# Patient Record
Sex: Female | Born: 1935 | Race: White | Hispanic: No | Smoking: Former smoker
Health system: Southern US, Community
[De-identification: ages and names within clinical notes are randomized; demographics above are authoritative.]

## PROBLEM LIST (undated history)

## (undated) DIAGNOSIS — G8929 Other chronic pain: Secondary | ICD-10-CM

## (undated) DIAGNOSIS — M353 Polymyalgia rheumatica: Secondary | ICD-10-CM

## (undated) DIAGNOSIS — Z8711 Personal history of peptic ulcer disease: Secondary | ICD-10-CM

## (undated) DIAGNOSIS — F419 Anxiety disorder, unspecified: Secondary | ICD-10-CM

## (undated) DIAGNOSIS — N760 Acute vaginitis: Secondary | ICD-10-CM

## (undated) DIAGNOSIS — H409 Unspecified glaucoma: Secondary | ICD-10-CM

## (undated) DIAGNOSIS — F329 Major depressive disorder, single episode, unspecified: Secondary | ICD-10-CM

## (undated) DIAGNOSIS — M545 Low back pain, unspecified: Secondary | ICD-10-CM

## (undated) DIAGNOSIS — S32000A Wedge compression fracture of unspecified lumbar vertebra, initial encounter for closed fracture: Secondary | ICD-10-CM

## (undated) DIAGNOSIS — J309 Allergic rhinitis, unspecified: Secondary | ICD-10-CM

## (undated) DIAGNOSIS — M1990 Unspecified osteoarthritis, unspecified site: Secondary | ICD-10-CM

## (undated) DIAGNOSIS — D126 Benign neoplasm of colon, unspecified: Secondary | ICD-10-CM

## (undated) DIAGNOSIS — F32A Depression, unspecified: Secondary | ICD-10-CM

## (undated) DIAGNOSIS — J189 Pneumonia, unspecified organism: Secondary | ICD-10-CM

## (undated) DIAGNOSIS — M810 Age-related osteoporosis without current pathological fracture: Secondary | ICD-10-CM

## (undated) DIAGNOSIS — Z8719 Personal history of other diseases of the digestive system: Secondary | ICD-10-CM

## (undated) DIAGNOSIS — I8390 Asymptomatic varicose veins of unspecified lower extremity: Secondary | ICD-10-CM

## (undated) DIAGNOSIS — K2270 Barrett's esophagus without dysplasia: Secondary | ICD-10-CM

## (undated) DIAGNOSIS — M9420 Chondromalacia, unspecified site: Secondary | ICD-10-CM

## (undated) DIAGNOSIS — K219 Gastro-esophageal reflux disease without esophagitis: Secondary | ICD-10-CM

## (undated) DIAGNOSIS — K5790 Diverticulosis of intestine, part unspecified, without perforation or abscess without bleeding: Secondary | ICD-10-CM

## (undated) DIAGNOSIS — I639 Cerebral infarction, unspecified: Secondary | ICD-10-CM

## (undated) HISTORY — DX: Chondromalacia, unspecified site: M94.20

## (undated) HISTORY — PX: FRACTURE SURGERY: SHX138

## (undated) HISTORY — DX: Gastro-esophageal reflux disease without esophagitis: K21.9

## (undated) HISTORY — DX: Major depressive disorder, single episode, unspecified: F32.9

## (undated) HISTORY — DX: Asymptomatic varicose veins of unspecified lower extremity: I83.90

## (undated) HISTORY — DX: Pneumonia, unspecified organism: J18.9

## (undated) HISTORY — DX: Unspecified glaucoma: H40.9

## (undated) HISTORY — PX: CATARACT EXTRACTION W/ INTRAOCULAR LENS  IMPLANT, BILATERAL: SHX1307

## (undated) HISTORY — DX: Acute vaginitis: N76.0

## (undated) HISTORY — DX: Depression, unspecified: F32.A

## (undated) HISTORY — DX: Anxiety disorder, unspecified: F41.9

## (undated) HISTORY — DX: Polymyalgia rheumatica: M35.3

## (undated) HISTORY — DX: Age-related osteoporosis without current pathological fracture: M81.0

## (undated) HISTORY — DX: Allergic rhinitis, unspecified: J30.9

## (undated) HISTORY — PX: LUMBAR SPINE SURGERY: SHX701

## (undated) HISTORY — DX: Diverticulosis of intestine, part unspecified, without perforation or abscess without bleeding: K57.90

## (undated) HISTORY — DX: Barrett's esophagus without dysplasia: K22.70

## (undated) HISTORY — PX: VARICOSE VEIN SURGERY: SHX832

## (undated) HISTORY — DX: Benign neoplasm of colon, unspecified: D12.6

## (undated) HISTORY — PX: LUNG BIOPSY: SHX232

## (undated) HISTORY — PX: FIXATION KYPHOPLASTY LUMBAR SPINE: SHX1642

## (undated) HISTORY — PX: BACK SURGERY: SHX140

---

## 1997-06-10 DIAGNOSIS — K2270 Barrett's esophagus without dysplasia: Secondary | ICD-10-CM

## 1997-06-10 HISTORY — DX: Barrett's esophagus without dysplasia: K22.70

## 1997-11-22 ENCOUNTER — Other Ambulatory Visit: Admission: RE | Admit: 1997-11-22 | Discharge: 1997-11-22 | Payer: Self-pay | Admitting: Obstetrics and Gynecology

## 1998-12-10 ENCOUNTER — Other Ambulatory Visit: Admission: RE | Admit: 1998-12-10 | Discharge: 1998-12-10 | Payer: Self-pay | Admitting: Obstetrics and Gynecology

## 1999-06-11 DIAGNOSIS — D126 Benign neoplasm of colon, unspecified: Secondary | ICD-10-CM

## 1999-06-11 HISTORY — DX: Benign neoplasm of colon, unspecified: D12.6

## 1999-06-16 ENCOUNTER — Ambulatory Visit (HOSPITAL_COMMUNITY): Admission: RE | Admit: 1999-06-16 | Discharge: 1999-06-16 | Payer: Self-pay | Admitting: *Deleted

## 1999-06-16 ENCOUNTER — Encounter (INDEPENDENT_AMBULATORY_CARE_PROVIDER_SITE_OTHER): Payer: Self-pay

## 2000-05-23 ENCOUNTER — Other Ambulatory Visit: Admission: RE | Admit: 2000-05-23 | Discharge: 2000-05-23 | Payer: Self-pay | Admitting: Obstetrics and Gynecology

## 2001-06-29 ENCOUNTER — Other Ambulatory Visit: Admission: RE | Admit: 2001-06-29 | Discharge: 2001-06-29 | Payer: Self-pay | Admitting: Obstetrics and Gynecology

## 2001-09-29 ENCOUNTER — Ambulatory Visit (HOSPITAL_COMMUNITY): Admission: RE | Admit: 2001-09-29 | Discharge: 2001-09-29 | Payer: Self-pay | Admitting: *Deleted

## 2001-09-29 ENCOUNTER — Encounter (INDEPENDENT_AMBULATORY_CARE_PROVIDER_SITE_OTHER): Payer: Self-pay

## 2002-03-20 ENCOUNTER — Encounter: Admission: RE | Admit: 2002-03-20 | Discharge: 2002-03-20 | Payer: Self-pay | Admitting: Internal Medicine

## 2002-03-20 ENCOUNTER — Encounter: Payer: Self-pay | Admitting: Internal Medicine

## 2002-05-08 ENCOUNTER — Encounter: Admission: RE | Admit: 2002-05-08 | Discharge: 2002-05-08 | Payer: Self-pay | Admitting: Otolaryngology

## 2002-05-08 ENCOUNTER — Encounter: Payer: Self-pay | Admitting: Otolaryngology

## 2002-06-06 ENCOUNTER — Emergency Department (HOSPITAL_COMMUNITY): Admission: EM | Admit: 2002-06-06 | Discharge: 2002-06-06 | Payer: Self-pay | Admitting: Emergency Medicine

## 2002-06-06 ENCOUNTER — Encounter: Payer: Self-pay | Admitting: Emergency Medicine

## 2002-06-13 ENCOUNTER — Emergency Department (HOSPITAL_COMMUNITY): Admission: EM | Admit: 2002-06-13 | Discharge: 2002-06-13 | Payer: Self-pay | Admitting: Emergency Medicine

## 2002-07-30 ENCOUNTER — Other Ambulatory Visit: Admission: RE | Admit: 2002-07-30 | Discharge: 2002-07-30 | Payer: Self-pay | Admitting: Obstetrics and Gynecology

## 2002-10-11 ENCOUNTER — Emergency Department (HOSPITAL_COMMUNITY): Admission: EM | Admit: 2002-10-11 | Discharge: 2002-10-11 | Payer: Self-pay | Admitting: Emergency Medicine

## 2002-10-13 ENCOUNTER — Emergency Department (HOSPITAL_COMMUNITY): Admission: EM | Admit: 2002-10-13 | Discharge: 2002-10-13 | Payer: Self-pay | Admitting: Emergency Medicine

## 2005-11-17 ENCOUNTER — Encounter: Admission: RE | Admit: 2005-11-17 | Discharge: 2005-11-17 | Payer: Self-pay | Admitting: Internal Medicine

## 2006-08-07 ENCOUNTER — Emergency Department (HOSPITAL_COMMUNITY): Admission: EM | Admit: 2006-08-07 | Discharge: 2006-08-07 | Payer: Self-pay | Admitting: Emergency Medicine

## 2007-08-07 ENCOUNTER — Other Ambulatory Visit: Admission: RE | Admit: 2007-08-07 | Discharge: 2007-08-07 | Payer: Self-pay | Admitting: Gynecology

## 2007-08-30 ENCOUNTER — Encounter: Payer: Self-pay | Admitting: Internal Medicine

## 2007-09-01 ENCOUNTER — Encounter: Admission: RE | Admit: 2007-09-01 | Discharge: 2007-09-01 | Payer: Self-pay

## 2007-09-28 ENCOUNTER — Ambulatory Visit: Payer: Self-pay | Admitting: Internal Medicine

## 2007-09-28 DIAGNOSIS — J309 Allergic rhinitis, unspecified: Secondary | ICD-10-CM | POA: Insufficient documentation

## 2007-09-28 DIAGNOSIS — Z87891 Personal history of nicotine dependence: Secondary | ICD-10-CM | POA: Insufficient documentation

## 2007-09-28 DIAGNOSIS — J159 Unspecified bacterial pneumonia: Secondary | ICD-10-CM | POA: Insufficient documentation

## 2007-09-28 DIAGNOSIS — J984 Other disorders of lung: Secondary | ICD-10-CM | POA: Insufficient documentation

## 2007-11-01 ENCOUNTER — Ambulatory Visit: Payer: Self-pay | Admitting: Internal Medicine

## 2008-06-12 ENCOUNTER — Ambulatory Visit (HOSPITAL_COMMUNITY): Admission: RE | Admit: 2008-06-12 | Discharge: 2008-06-12 | Payer: Self-pay | Admitting: *Deleted

## 2009-05-24 ENCOUNTER — Inpatient Hospital Stay (HOSPITAL_COMMUNITY): Admission: EM | Admit: 2009-05-24 | Discharge: 2009-05-31 | Payer: Self-pay | Admitting: Emergency Medicine

## 2009-05-26 ENCOUNTER — Ambulatory Visit: Payer: Self-pay | Admitting: Internal Medicine

## 2009-06-01 ENCOUNTER — Ambulatory Visit: Payer: Self-pay | Admitting: Cardiovascular Disease

## 2009-06-01 ENCOUNTER — Inpatient Hospital Stay (HOSPITAL_COMMUNITY): Admission: EM | Admit: 2009-06-01 | Discharge: 2009-06-11 | Payer: Self-pay | Admitting: Emergency Medicine

## 2009-11-05 ENCOUNTER — Ambulatory Visit: Payer: Self-pay | Admitting: Gynecology

## 2009-11-05 ENCOUNTER — Other Ambulatory Visit: Admission: RE | Admit: 2009-11-05 | Discharge: 2009-11-05 | Payer: Self-pay | Admitting: Gynecology

## 2009-12-03 ENCOUNTER — Ambulatory Visit: Payer: Self-pay | Admitting: Gynecology

## 2010-07-22 ENCOUNTER — Ambulatory Visit (INDEPENDENT_AMBULATORY_CARE_PROVIDER_SITE_OTHER): Payer: Medicare Other | Admitting: Gynecology

## 2010-07-22 DIAGNOSIS — B373 Candidiasis of vulva and vagina: Secondary | ICD-10-CM

## 2010-07-22 DIAGNOSIS — N898 Other specified noninflammatory disorders of vagina: Secondary | ICD-10-CM

## 2010-07-26 LAB — BLOOD GAS, ARTERIAL
Acid-Base Excess: 1.9 mmol/L (ref 0.0–2.0)
Acid-base deficit: 1.1 mmol/L (ref 0.0–2.0)
Acid-base deficit: 3.9 mmol/L — ABNORMAL HIGH (ref 0.0–2.0)
Acid-base deficit: 4.5 mmol/L — ABNORMAL HIGH (ref 0.0–2.0)
Acid-base deficit: 5 mmol/L — ABNORMAL HIGH (ref 0.0–2.0)
Acid-base deficit: 9.8 mmol/L — ABNORMAL HIGH (ref 0.0–2.0)
Bicarbonate: 19 mEq/L — ABNORMAL LOW (ref 20.0–24.0)
Bicarbonate: 19.9 mEq/L — ABNORMAL LOW (ref 20.0–24.0)
Bicarbonate: 23.3 mEq/L (ref 20.0–24.0)
Bicarbonate: 24.1 mEq/L — ABNORMAL HIGH (ref 20.0–24.0)
Drawn by: 129801
Drawn by: 232811
Drawn by: 232811
Drawn by: 295031
Drawn by: 308601
Drawn by: 324021
Expiratory PAP: 6
FIO2: 0.21 %
FIO2: 0.4 %
FIO2: 1 %
MECHVT: 300 mL
MECHVT: 300 mL
O2 Content: 3 L/min
O2 Content: 3 L/min
O2 Content: 4 L/min
O2 Saturation: 89.8 %
O2 Saturation: 91.4 %
O2 Saturation: 92.4 %
O2 Saturation: 93.7 %
O2 Saturation: 98 %
PEEP: 5 cmH2O
PEEP: 5 cmH2O
Patient temperature: 101.9
Patient temperature: 98.6
Patient temperature: 98.6
Patient temperature: 98.6
Patient temperature: 98.6
Patient temperature: 98.6
Patient temperature: 98.9
Patient temperature: 98.9
RATE: 16 resp/min
TCO2: 17.9 mmol/L (ref 0–100)
TCO2: 18.6 mmol/L (ref 0–100)
TCO2: 18.7 mmol/L (ref 0–100)
TCO2: 20.8 mmol/L (ref 0–100)
TCO2: 22.3 mmol/L (ref 0–100)
TCO2: 22.9 mmol/L (ref 0–100)
pCO2 arterial: 35.3 mmHg (ref 35.0–45.0)
pCO2 arterial: 37.1 mmHg (ref 35.0–45.0)
pCO2 arterial: 38.4 mmHg (ref 35.0–45.0)
pCO2 arterial: 38.4 mmHg (ref 35.0–45.0)
pCO2 arterial: 42 mmHg (ref 35.0–45.0)
pH, Arterial: 7.289 — ABNORMAL LOW (ref 7.350–7.400)
pH, Arterial: 7.333 — ABNORMAL LOW (ref 7.350–7.400)
pH, Arterial: 7.352 (ref 7.350–7.400)
pH, Arterial: 7.359 (ref 7.350–7.400)
pH, Arterial: 7.383 (ref 7.350–7.400)
pH, Arterial: 7.404 — ABNORMAL HIGH (ref 7.350–7.400)
pO2, Arterial: 195 mmHg — ABNORMAL HIGH (ref 80.0–100.0)
pO2, Arterial: 301 mmHg — ABNORMAL HIGH (ref 80.0–100.0)
pO2, Arterial: 69.3 mmHg — ABNORMAL LOW (ref 80.0–100.0)
pO2, Arterial: 72.6 mmHg — ABNORMAL LOW (ref 80.0–100.0)
pO2, Arterial: 88.1 mmHg (ref 80.0–100.0)
pO2, Arterial: 91.5 mmHg (ref 80.0–100.0)
pO2, Arterial: 96.5 mmHg (ref 80.0–100.0)

## 2010-07-26 LAB — GLUCOSE, CAPILLARY
Glucose-Capillary: 100 mg/dL — ABNORMAL HIGH (ref 70–99)
Glucose-Capillary: 102 mg/dL — ABNORMAL HIGH (ref 70–99)
Glucose-Capillary: 106 mg/dL — ABNORMAL HIGH (ref 70–99)
Glucose-Capillary: 106 mg/dL — ABNORMAL HIGH (ref 70–99)
Glucose-Capillary: 111 mg/dL — ABNORMAL HIGH (ref 70–99)
Glucose-Capillary: 116 mg/dL — ABNORMAL HIGH (ref 70–99)
Glucose-Capillary: 117 mg/dL — ABNORMAL HIGH (ref 70–99)
Glucose-Capillary: 140 mg/dL — ABNORMAL HIGH (ref 70–99)
Glucose-Capillary: 153 mg/dL — ABNORMAL HIGH (ref 70–99)
Glucose-Capillary: 83 mg/dL (ref 70–99)
Glucose-Capillary: 88 mg/dL (ref 70–99)
Glucose-Capillary: 94 mg/dL (ref 70–99)
Glucose-Capillary: 95 mg/dL (ref 70–99)
Glucose-Capillary: 99 mg/dL (ref 70–99)

## 2010-07-26 LAB — URINE MICROSCOPIC-ADD ON

## 2010-07-26 LAB — CSF CELL COUNT WITH DIFFERENTIAL: WBC, CSF: 2 /mm3 (ref 0–5)

## 2010-07-26 LAB — DIFFERENTIAL
Basophils Absolute: 0 10*3/uL (ref 0.0–0.1)
Basophils Absolute: 0 10*3/uL (ref 0.0–0.1)
Basophils Relative: 0 % (ref 0–1)
Basophils Relative: 0 % (ref 0–1)
Eosinophils Absolute: 0.2 10*3/uL (ref 0.0–0.7)
Eosinophils Absolute: 0 10*3/uL (ref 0.0–0.7)
Eosinophils Absolute: 0.1 10*3/uL (ref 0.0–0.7)
Eosinophils Relative: 2 % (ref 0–5)
Eosinophils Relative: 5 % (ref 0–5)
Eosinophils Relative: 8 % — ABNORMAL HIGH (ref 0–5)
Lymphocytes Relative: 2 % — ABNORMAL LOW (ref 12–46)
Lymphocytes Relative: 4 % — ABNORMAL LOW (ref 12–46)
Lymphs Abs: 0.4 10*3/uL — ABNORMAL LOW (ref 0.7–4.0)
Lymphs Abs: 0.4 10*3/uL — ABNORMAL LOW (ref 0.7–4.0)
Monocytes Absolute: 0.4 10*3/uL (ref 0.1–1.0)
Monocytes Absolute: 0.4 10*3/uL (ref 0.1–1.0)
Monocytes Absolute: 0.4 10*3/uL (ref 0.1–1.0)
Monocytes Absolute: 0.6 10*3/uL (ref 0.1–1.0)
Monocytes Relative: 4 % (ref 3–12)
Monocytes Relative: 1 % — ABNORMAL LOW (ref 3–12)
Monocytes Relative: 3 % (ref 3–12)
Monocytes Relative: 5 % (ref 3–12)
Neutro Abs: 7.4 10*3/uL (ref 1.7–7.7)
Neutro Abs: 25.3 10*3/uL — ABNORMAL HIGH (ref 1.7–7.7)
Neutrophils Relative %: 86 % — ABNORMAL HIGH (ref 43–77)
Neutrophils Relative %: 93 % — ABNORMAL HIGH (ref 43–77)
Neutrophils Relative %: 97 % — ABNORMAL HIGH (ref 43–77)
WBC Morphology: INCREASED

## 2010-07-26 LAB — CBC
HCT: 39 % (ref 36.0–46.0)
HCT: 40.6 % (ref 36.0–46.0)
HCT: 28.1 % — ABNORMAL LOW (ref 36.0–46.0)
HCT: 28.6 % — ABNORMAL LOW (ref 36.0–46.0)
HCT: 29.1 % — ABNORMAL LOW (ref 36.0–46.0)
HCT: 31.6 % — ABNORMAL LOW (ref 36.0–46.0)
HCT: 31.9 % — ABNORMAL LOW (ref 36.0–46.0)
HCT: 39.3 % (ref 36.0–46.0)
Hemoglobin: 13.4 g/dL (ref 12.0–15.0)
Hemoglobin: 13.5 g/dL (ref 12.0–15.0)
Hemoglobin: 10.8 g/dL — ABNORMAL LOW (ref 12.0–15.0)
Hemoglobin: 13.1 g/dL (ref 12.0–15.0)
Hemoglobin: 9.4 g/dL — ABNORMAL LOW (ref 12.0–15.0)
Hemoglobin: 9.6 g/dL — ABNORMAL LOW (ref 12.0–15.0)
MCHC: 33.3 g/dL (ref 30.0–36.0)
MCHC: 34.3 g/dL (ref 30.0–36.0)
MCHC: 32.5 g/dL (ref 30.0–36.0)
MCHC: 32.7 g/dL (ref 30.0–36.0)
MCHC: 32.9 g/dL (ref 30.0–36.0)
MCHC: 33.3 g/dL (ref 30.0–36.0)
MCHC: 33.6 g/dL (ref 30.0–36.0)
MCHC: 34.2 g/dL (ref 30.0–36.0)
MCV: 90.9 fL (ref 78.0–100.0)
MCV: 92 fL (ref 78.0–100.0)
MCV: 91.2 fL (ref 78.0–100.0)
MCV: 91.3 fL (ref 78.0–100.0)
MCV: 91.3 fL (ref 78.0–100.0)
MCV: 91.8 fL (ref 78.0–100.0)
MCV: 92.1 fL (ref 78.0–100.0)
MCV: 92.1 fL (ref 78.0–100.0)
MCV: 92.4 fL (ref 78.0–100.0)
MCV: 92.5 fL (ref 78.0–100.0)
Platelets: 242 10*3/uL (ref 150–400)
Platelets: 100 10*3/uL — ABNORMAL LOW (ref 150–400)
Platelets: 116 10*3/uL — ABNORMAL LOW (ref 150–400)
Platelets: 128 10*3/uL — ABNORMAL LOW (ref 150–400)
Platelets: 401 10*3/uL — ABNORMAL HIGH (ref 150–400)
Platelets: 454 10*3/uL — ABNORMAL HIGH (ref 150–400)
Platelets: 475 10*3/uL — ABNORMAL HIGH (ref 150–400)
Platelets: 503 10*3/uL — ABNORMAL HIGH (ref 150–400)
Platelets: 528 10*3/uL — ABNORMAL HIGH (ref 150–400)
Platelets: 579 10*3/uL — ABNORMAL HIGH (ref 150–400)
RBC: 4.28 MIL/uL (ref 3.87–5.11)
RBC: 4.41 MIL/uL (ref 3.87–5.11)
RBC: 3.07 MIL/uL — ABNORMAL LOW (ref 3.87–5.11)
RBC: 3.13 MIL/uL — ABNORMAL LOW (ref 3.87–5.11)
RBC: 3.45 MIL/uL — ABNORMAL LOW (ref 3.87–5.11)
RBC: 3.47 MIL/uL — ABNORMAL LOW (ref 3.87–5.11)
RBC: 3.77 MIL/uL — ABNORMAL LOW (ref 3.87–5.11)
RDW: 12.9 % (ref 11.5–15.5)
RDW: 13 % (ref 11.5–15.5)
RDW: 13.6 % (ref 11.5–15.5)
RDW: 13.6 % (ref 11.5–15.5)
RDW: 13.7 % (ref 11.5–15.5)
RDW: 14 % (ref 11.5–15.5)
WBC: 7.7 10*3/uL (ref 4.0–10.5)
WBC: 12 10*3/uL — ABNORMAL HIGH (ref 4.0–10.5)
WBC: 12.9 10*3/uL — ABNORMAL HIGH (ref 4.0–10.5)
WBC: 14.9 10*3/uL — ABNORMAL HIGH (ref 4.0–10.5)
WBC: 18.5 10*3/uL — ABNORMAL HIGH (ref 4.0–10.5)
WBC: 26.1 10*3/uL — ABNORMAL HIGH (ref 4.0–10.5)
WBC: 9.3 10*3/uL (ref 4.0–10.5)
WBC: 9.6 10*3/uL (ref 4.0–10.5)

## 2010-07-26 LAB — BASIC METABOLIC PANEL
BUN: 10 mg/dL (ref 6–23)
BUN: 5 mg/dL — ABNORMAL LOW (ref 6–23)
BUN: 7 mg/dL (ref 6–23)
BUN: 7 mg/dL (ref 6–23)
BUN: 8 mg/dL (ref 6–23)
CO2: 21 mEq/L (ref 19–32)
CO2: 29 mEq/L (ref 19–32)
Calcium: 8 mg/dL — ABNORMAL LOW (ref 8.4–10.5)
Calcium: 8.2 mg/dL — ABNORMAL LOW (ref 8.4–10.5)
Calcium: 8.4 mg/dL (ref 8.4–10.5)
Calcium: 8.7 mg/dL (ref 8.4–10.5)
Calcium: 8.8 mg/dL (ref 8.4–10.5)
Chloride: 102 mEq/L (ref 96–112)
Creatinine, Ser: 0.65 mg/dL (ref 0.4–1.2)
Creatinine, Ser: 0.7 mg/dL (ref 0.4–1.2)
Creatinine, Ser: 0.75 mg/dL (ref 0.4–1.2)
Creatinine, Ser: 0.85 mg/dL (ref 0.4–1.2)
GFR calc Af Amer: >60 mL/min (ref >60)
GFR calc Af Amer: >60 mL/min (ref >60)
GFR calc Af Amer: >60 mL/min (ref >60)
GFR calc Af Amer: >60 mL/min (ref >60)
GFR calc non Af Amer: >60 mL/min (ref >60)
GFR calc non Af Amer: >60 mL/min (ref >60)
GFR calc non Af Amer: >60 mL/min (ref >60)
GFR calc non Af Amer: >60 mL/min (ref >60)
GFR calc non Af Amer: >60 mL/min (ref >60)
GFR calc non Af Amer: >60 mL/min (ref >60)
Glucose, Bld: 132 mg/dL — ABNORMAL HIGH (ref 70–99)
Potassium: 3.6 mEq/L (ref 3.5–5.1)
Potassium: 4.1 mEq/L (ref 3.5–5.1)
Sodium: 133 mEq/L — ABNORMAL LOW (ref 135–145)
Sodium: 134 mEq/L — ABNORMAL LOW (ref 135–145)
Sodium: 135 mEq/L (ref 135–145)
Sodium: 139 mEq/L (ref 135–145)

## 2010-07-26 LAB — BRAIN NATRIURETIC PEPTIDE
Pro B Natriuretic peptide (BNP): 2290 pg/mL — ABNORMAL HIGH (ref 0.0–100.0)
Pro B Natriuretic peptide (BNP): 459 pg/mL — ABNORMAL HIGH (ref 0.0–100.0)
Pro B Natriuretic peptide (BNP): 477 pg/mL — ABNORMAL HIGH (ref 0.0–100.0)
Pro B Natriuretic peptide (BNP): 544 pg/mL — ABNORMAL HIGH (ref 0.0–100.0)

## 2010-07-26 LAB — COMPREHENSIVE METABOLIC PANEL
ALT: 17 U/L (ref 0–35)
AST: 14 U/L (ref 0–37)
AST: 20 U/L (ref 0–37)
AST: 30 U/L (ref 0–37)
Albumin: 1.9 g/dL — ABNORMAL LOW (ref 3.5–5.2)
Albumin: 2.1 g/dL — ABNORMAL LOW (ref 3.5–5.2)
Albumin: 2.1 g/dL — ABNORMAL LOW (ref 3.5–5.2)
Albumin: 2.1 g/dL — ABNORMAL LOW (ref 3.5–5.2)
Alkaline Phosphatase: 52 U/L (ref 39–117)
Alkaline Phosphatase: 54 U/L (ref 39–117)
Alkaline Phosphatase: 55 U/L (ref 39–117)
Alkaline Phosphatase: 56 U/L (ref 39–117)
BUN: 10 mg/dL (ref 6–23)
BUN: 12 mg/dL (ref 6–23)
BUN: 4 mg/dL — ABNORMAL LOW (ref 6–23)
BUN: 5 mg/dL — ABNORMAL LOW (ref 6–23)
BUN: 6 mg/dL (ref 6–23)
Calcium: 7.9 mg/dL — ABNORMAL LOW (ref 8.4–10.5)
Calcium: 8.3 mg/dL — ABNORMAL LOW (ref 8.4–10.5)
Calcium: 8.4 mg/dL (ref 8.4–10.5)
Calcium: 8.8 mg/dL (ref 8.4–10.5)
Chloride: 100 mEq/L (ref 96–112)
Chloride: 100 mEq/L (ref 96–112)
Chloride: 102 mEq/L (ref 96–112)
Creatinine, Ser: 0.58 mg/dL (ref 0.4–1.2)
Creatinine, Ser: 0.7 mg/dL (ref 0.4–1.2)
Creatinine, Ser: 0.73 mg/dL (ref 0.4–1.2)
Creatinine, Ser: 0.77 mg/dL (ref 0.4–1.2)
Creatinine, Ser: 0.78 mg/dL (ref 0.4–1.2)
GFR calc Af Amer: >60 mL/min (ref >60)
GFR calc Af Amer: >60 mL/min (ref >60)
GFR calc Af Amer: >60 mL/min (ref >60)
Glucose, Bld: 101 mg/dL — ABNORMAL HIGH (ref 70–99)
Glucose, Bld: 88 mg/dL (ref 70–99)
Glucose, Bld: 93 mg/dL (ref 70–99)
Potassium: 3.2 mEq/L — ABNORMAL LOW (ref 3.5–5.1)
Potassium: 3.5 mEq/L (ref 3.5–5.1)
Potassium: 3.7 mEq/L (ref 3.5–5.1)
Sodium: 135 mEq/L (ref 135–145)
Sodium: 135 mEq/L (ref 135–145)
Total Bilirubin: 0.4 mg/dL (ref 0.3–1.2)
Total Bilirubin: 0.5 mg/dL (ref 0.3–1.2)
Total Bilirubin: 0.8 mg/dL (ref 0.3–1.2)
Total Bilirubin: 0.8 mg/dL (ref 0.3–1.2)
Total Protein: 4.7 g/dL — ABNORMAL LOW (ref 6.0–8.3)
Total Protein: 5.3 g/dL — ABNORMAL LOW (ref 6.0–8.3)
Total Protein: 5.6 g/dL — ABNORMAL LOW (ref 6.0–8.3)

## 2010-07-26 LAB — BASIC METABOLIC PANEL WITH GFR
BUN: 16 mg/dL (ref 6–23)
CO2: 28 meq/L (ref 19–32)
Calcium: 9.2 mg/dL (ref 8.4–10.5)
Chloride: 102 meq/L (ref 96–112)
Creatinine, Ser: 0.82 mg/dL (ref 0.4–1.2)
GFR calc non Af Amer: >60 mL/min
Glucose, Bld: 86 mg/dL (ref 70–99)
Potassium: 3.5 meq/L (ref 3.5–5.1)
Sodium: 137 meq/L (ref 135–145)

## 2010-07-26 LAB — CULTURE, BLOOD (ROUTINE X 2)
Culture: NO GROWTH
Culture: NO GROWTH
Culture: NO GROWTH

## 2010-07-26 LAB — URINALYSIS, ROUTINE W REFLEX MICROSCOPIC
Bilirubin Urine: NEGATIVE
Glucose, UA: NEGATIVE mg/dL
Hgb urine dipstick: NEGATIVE
Nitrite: NEGATIVE
Protein, ur: NEGATIVE mg/dL
Specific Gravity, Urine: 1.017 (ref 1.005–1.030)
Specific Gravity, Urine: 1.005 (ref 1.005–1.030)
Urobilinogen, UA: 0.2 mg/dL (ref 0.0–1.0)
pH: 7.5 (ref 5.0–8.0)

## 2010-07-26 LAB — LACTIC ACID, PLASMA
Lactic Acid, Venous: 0.8 mmol/L (ref 0.5–2.2)
Lactic Acid, Venous: 0.9 mmol/L (ref 0.5–2.2)
Lactic Acid, Venous: 1.1 mmol/L (ref 0.5–2.2)
Lactic Acid, Venous: 1.9 mmol/L (ref 0.5–2.2)

## 2010-07-26 LAB — URINE CULTURE

## 2010-07-26 LAB — CK TOTAL AND CKMB (NOT AT ARMC)
CK, MB: 1.2 ng/mL (ref 0.3–4.0)
Total CK: 42 U/L (ref 7–177)

## 2010-07-26 LAB — CULTURE, BAL-QUANTITATIVE W GRAM STAIN

## 2010-07-26 LAB — SEDIMENTATION RATE: Sed Rate: 43 mm/hr — ABNORMAL HIGH (ref 0–22)

## 2010-07-26 LAB — PROTEIN AND GLUCOSE, CSF
Glucose, CSF: 77 mg/dL — ABNORMAL HIGH (ref 43–76)
Total  Protein, CSF: 46 mg/dL — ABNORMAL HIGH (ref 15–45)

## 2010-07-26 LAB — CARDIAC PANEL(CRET KIN+CKTOT+MB+TROPI)
CK, MB: 5.3 ng/mL — ABNORMAL HIGH (ref 0.3–4.0)
CK, MB: 6.7 ng/mL (ref 0.3–4.0)
Relative Index: 1.5 (ref 0.0–2.5)
Relative Index: INVALID (ref 0.0–2.5)
Total CK: 163 U/L (ref 7–177)
Total CK: 438 U/L — ABNORMAL HIGH (ref 7–177)
Troponin I: 0.08 ng/mL — ABNORMAL HIGH (ref 0.00–0.06)
Troponin I: 0.15 ng/mL — ABNORMAL HIGH (ref 0.00–0.06)
Troponin I: 0.33 ng/mL — ABNORMAL HIGH (ref 0.00–0.06)
Troponin I: 0.36 ng/mL — ABNORMAL HIGH (ref 0.00–0.06)
Troponin I: 0.58 ng/mL (ref 0.00–0.06)

## 2010-07-26 LAB — PHOSPHORUS: Phosphorus: 1.1 mg/dL — ABNORMAL LOW (ref 2.3–4.6)

## 2010-07-26 LAB — MAGNESIUM
Magnesium: 1.7 mg/dL (ref 1.5–2.5)
Magnesium: 1.8 mg/dL (ref 1.5–2.5)
Magnesium: 2.1 mg/dL (ref 1.5–2.5)

## 2010-07-26 LAB — CORTISOL: Cortisol, Plasma: 26.6 ug/dL

## 2010-07-26 LAB — POCT CARDIAC MARKERS: Myoglobin, poc: 72 ng/mL (ref 12–200)

## 2010-07-26 LAB — TROPONIN I: Troponin I: 0.01 ng/mL (ref 0.00–0.06)

## 2010-07-29 LAB — BASIC METABOLIC PANEL
BUN: 7 mg/dL (ref 6–23)
CO2: 29 mEq/L (ref 19–32)
CO2: 30 mEq/L (ref 19–32)
Calcium: 9.3 mg/dL (ref 8.4–10.5)
Calcium: 9.4 mg/dL (ref 8.4–10.5)
Creatinine, Ser: 0.75 mg/dL (ref 0.4–1.2)
GFR calc Af Amer: >60 mL/min (ref >60)
GFR calc Af Amer: >60 mL/min (ref >60)
GFR calc non Af Amer: >60 mL/min (ref >60)
Sodium: 138 mEq/L (ref 135–145)

## 2010-07-29 LAB — CBC
Hemoglobin: 10.8 g/dL — ABNORMAL LOW (ref 12.0–15.0)
MCHC: 33.7 g/dL (ref 30.0–36.0)
Platelets: 610 10*3/uL — ABNORMAL HIGH (ref 150–400)
RBC: 3.52 MIL/uL — ABNORMAL LOW (ref 3.87–5.11)
RBC: 3.54 MIL/uL — ABNORMAL LOW (ref 3.87–5.11)
RDW: 13.9 % (ref 11.5–15.5)
WBC: 10.5 10*3/uL (ref 4.0–10.5)

## 2010-07-29 LAB — LACTIC ACID, PLASMA: Lactic Acid, Venous: 1.3 mmol/L (ref 0.5–2.2)

## 2010-09-22 NOTE — Op Note (Signed)
Katrina Burnett, Katrina Burnett                 ACCOUNT NO.:  [id number]   MEDICAL RECORD NO.:  [id number]          PATIENT TYPE:  AMB   LOCATION:  ENDO                         FACILITY:  [facility]   PHYSICIAN:  Georgiana Spinner, M.D.    DATE OF BIRTH:  [birth year]   DATE OF PROCEDURE:  06/12/2008  DATE OF DISCHARGE:                               OPERATIVE REPORT   PROCEDURE:  Colonoscopy.   INDICATIONS:  Colon polyps.   ANESTHESIA:  Fentanyl 60 mcg, Versed 6 mg.   PROCEDURE:  With the patient mildly sedated in the left lateral  decubitus position, the Pentax videoscopic pediatric colonoscope was  inserted in the rectum and passed under direct vision through a very  tortuous diverticula-filled sigmoid colon to reach the cecum; identified  by ileocecal valve and base of cecum -- both of which were photographed.  From this point the colonoscope was slowly withdrawn, taking  circumferential views of the colonic mucosa.  Stopping to photograph the  diverticula along the way until we reached the rectum, which appeared  normal on direct and showed hemorrhoids on retroflexed view.  The  endoscope was straightened and withdrawn.  The patient's vital signs and  pulse oximetry remained stable.  The patient tolerated the procedure  well without apparent complication.   FINDINGS:  Diverticulosis of the sigmoid colon and some scattered  throughout the colon actually,  Internal hemorrhoids.   PLAN:  Consider repeat examination in 5 years, if appropriate.           ______________________________  Georgiana Spinner, M.D.     GMO/MEDQ  D:  06/12/2008  T:  06/12/2008  Job:  [id number]

## 2010-09-25 NOTE — Procedures (Signed)
[facility]  Patient:    Katrina Burnett, Katrina Burnett Visit Number: [id number] MRN: [id number]          Service Type: END Location: ENDO Attending Physician:  Sabino Gasser Dictated by:   Sabino Gasser, M.D. Proc. Date: 09/29/01 Admit Date:  09/29/2001 Discharge Date: 09/29/2001                             Procedure Report  PROCEDURE: Upper endoscopy.  INDICATIONS: Barrett and GERD.  ANESTHESIA: Demerol 30 mg, Versed 4 mg.  DESCRIPTION OF PROCEDURE: With the patient mildly sedated in the left lateral decubitus position, the Olympus videoscopic endoscope was inserted into the mouth and passed under direct vision through the esophagus. I could not see any evidence of Barrett at this time. We photographed, entered into the stomach, fundus body, duodenum, all second portion of duodenum was visualized. From this point, the endoscope was slowly withdrawn taking circumferential views of the entire gastric duodenal mucosa through the endoscope then pulled in the stomach, placed in retroflexion to view the stomach from below. Hiatal hernia was seen. The endoscope was straight and withdrawn taking circumferential views of the any gastric and esophageal mucosa stopping in the stomach to photograph biopsy changes of gastritis. The patients vital signs and pulse oximetry remained stable. The patient tolerated the procedure well without apparent complications.  FINDINGS: Changes of gastritis biopsy.  PLAN: Await biopsy report. The patient will call me for results and followup with me as an outpatient. Proceed with colonoscopy as planed. Dictated by:   Sabino Gasser, M.D. Attending Physician:  Sabino Gasser DD:  09/29/01 TD:  10/02/01 Job: [id number]

## 2010-09-25 NOTE — Procedures (Signed)
[facility]  Patient:    Katrina Burnett, Katrina Burnett Visit Number: [id number] MRN: [id number]          Service Type: END Location: ENDO Attending Physician:  Sabino Gasser Dictated by:   Sabino Gasser, M.D. Proc. Date: 09/29/01 Admit Date:  09/29/2001 Discharge Date: 09/29/2001                             Procedure Report  PROCEDURE: Colonoscopy.  INDICATIONS: Colon polyps.  ANESTHESIA: Demerol 30 mg, Versed 3 mg additionally.  DESCRIPTION OF PROCEDURE: With the patient mildly sedated in the left lateral decubitus position, the Olympus videoscopic pediatric colonoscope was inserted in the rectum and passed under direct vision to the cecum, identified by ileocecal valve and appendiceal orifice, both of which were photographed. From this point, the colonoscope was then slowly withdrawn, taking circumferential views of the entire colonic mucosa, stopping only then to photograph diverticulosis seen in the sigmoid colon until we reached the rectum which appeared normal in direct and retroflexed view.  The endoscope was straightened and withdrawn. The patients vital signs and pulse oximetry remained stable. Patient tolerated procedure well without apparently complications.  FINDINGS: Diverticulosis of the sigmoid colon, otherwise unremarkable examination.  PLAN: Repeat examination possibly in five years. Dictated by:   Sabino Gasser, M.D. Attending Physician:  Sabino Gasser DD:  09/29/01 TD:  10/02/01 Job: [id number]

## 2010-09-28 ENCOUNTER — Other Ambulatory Visit: Payer: Self-pay | Admitting: Internal Medicine

## 2010-09-28 DIAGNOSIS — M549 Dorsalgia, unspecified: Secondary | ICD-10-CM

## 2010-09-30 ENCOUNTER — Ambulatory Visit
Admission: RE | Admit: 2010-09-30 | Discharge: 2010-09-30 | Disposition: A | Discharge status: Home / Self Care | Payer: Medicare Other | Source: Ambulatory Visit | Attending: Internal Medicine | Admitting: Internal Medicine

## 2010-09-30 DIAGNOSIS — M549 Dorsalgia, unspecified: Secondary | ICD-10-CM

## 2010-10-06 ENCOUNTER — Other Ambulatory Visit (HOSPITAL_COMMUNITY): Payer: Self-pay | Admitting: Internal Medicine

## 2010-10-06 DIAGNOSIS — IMO0002 Reserved for concepts with insufficient information to code with codable children: Secondary | ICD-10-CM

## 2010-10-08 ENCOUNTER — Other Ambulatory Visit (HOSPITAL_COMMUNITY): Payer: Self-pay | Admitting: Interventional Radiology

## 2010-10-08 ENCOUNTER — Ambulatory Visit (HOSPITAL_COMMUNITY)
Admission: RE | Admit: 2010-10-08 | Discharge: 2010-10-08 | Disposition: A | Discharge status: Home / Self Care | Payer: Medicare Other | Source: Ambulatory Visit | Attending: Internal Medicine | Admitting: Internal Medicine

## 2010-10-08 DIAGNOSIS — IMO0002 Reserved for concepts with insufficient information to code with codable children: Secondary | ICD-10-CM

## 2010-10-12 ENCOUNTER — Other Ambulatory Visit (HOSPITAL_COMMUNITY): Payer: Self-pay | Admitting: Interventional Radiology

## 2010-10-12 ENCOUNTER — Other Ambulatory Visit (HOSPITAL_COMMUNITY): Payer: Medicare Other

## 2010-10-12 DIAGNOSIS — IMO0002 Reserved for concepts with insufficient information to code with codable children: Secondary | ICD-10-CM

## 2010-10-15 ENCOUNTER — Other Ambulatory Visit: Payer: Self-pay | Admitting: Interventional Radiology

## 2010-10-15 ENCOUNTER — Ambulatory Visit (HOSPITAL_COMMUNITY)
Admission: RE | Admit: 2010-10-15 | Discharge: 2010-10-15 | Disposition: A | Discharge status: Home / Self Care | Payer: Medicare Other | Source: Ambulatory Visit | Attending: Interventional Radiology | Admitting: Interventional Radiology

## 2010-10-15 DIAGNOSIS — F329 Major depressive disorder, single episode, unspecified: Secondary | ICD-10-CM | POA: Insufficient documentation

## 2010-10-15 DIAGNOSIS — K219 Gastro-esophageal reflux disease without esophagitis: Secondary | ICD-10-CM | POA: Insufficient documentation

## 2010-10-15 DIAGNOSIS — F411 Generalized anxiety disorder: Secondary | ICD-10-CM | POA: Insufficient documentation

## 2010-10-15 DIAGNOSIS — F3289 Other specified depressive episodes: Secondary | ICD-10-CM | POA: Insufficient documentation

## 2010-10-15 DIAGNOSIS — M353 Polymyalgia rheumatica: Secondary | ICD-10-CM | POA: Insufficient documentation

## 2010-10-15 DIAGNOSIS — Z8711 Personal history of peptic ulcer disease: Secondary | ICD-10-CM | POA: Insufficient documentation

## 2010-10-15 DIAGNOSIS — M8448XA Pathological fracture, other site, initial encounter for fracture: Secondary | ICD-10-CM | POA: Insufficient documentation

## 2010-10-15 DIAGNOSIS — H409 Unspecified glaucoma: Secondary | ICD-10-CM | POA: Insufficient documentation

## 2010-10-15 DIAGNOSIS — IMO0002 Reserved for concepts with insufficient information to code with codable children: Secondary | ICD-10-CM

## 2010-10-15 LAB — CBC
HCT: 40.8 % (ref 36.0–46.0)
Hemoglobin: 12.8 g/dL (ref 12.0–15.0)
MCV: 86.1 fL (ref 78.0–100.0)
RBC: 4.74 MIL/uL (ref 3.87–5.11)
WBC: 9 10*3/uL (ref 4.0–10.5)

## 2010-10-15 LAB — APTT: aPTT: 32 seconds (ref 24–37)

## 2010-10-15 LAB — PROTIME-INR: INR: 1.02 (ref 0.00–1.49)

## 2010-10-19 LAB — POCT I-STAT, CHEM 8
BUN: 15 mg/dL (ref 6–23)
Calcium, Ion: 1.16 mmol/L (ref 1.12–1.32)
Glucose, Bld: 74 mg/dL (ref 70–99)
TCO2: 24 mmol/L (ref 0–100)

## 2010-10-20 ENCOUNTER — Other Ambulatory Visit (HOSPITAL_COMMUNITY): Payer: Self-pay | Admitting: Interventional Radiology

## 2010-10-20 DIAGNOSIS — IMO0002 Reserved for concepts with insufficient information to code with codable children: Secondary | ICD-10-CM

## 2010-10-29 ENCOUNTER — Ambulatory Visit (HOSPITAL_COMMUNITY)
Admission: RE | Admit: 2010-10-29 | Discharge: 2010-10-29 | Disposition: A | Discharge status: Home / Self Care | Payer: Medicare Other | Source: Ambulatory Visit | Attending: Interventional Radiology | Admitting: Interventional Radiology

## 2010-10-29 DIAGNOSIS — IMO0002 Reserved for concepts with insufficient information to code with codable children: Secondary | ICD-10-CM

## 2011-01-06 IMAGING — CT CT ANGIO CHEST
2 of 6 series · 19 of 36 positions shown · IV contrast (APPLIED)
Comparison: 05/24/2009

CLINICAL DATA: Shortness of breath.  Pneumonia.  Hypoxia.

CT ANGIOGRAPHY CHEST WITH CONTRAST
TECHNIQUE: Multidetector CT imaging of the chest was performed
using the standard protocol during bolus administration of
intravenous contrast.  Multiplanar CT image reconstructions
including MIPs were obtained to evaluate the vascular anatomy.
Contrast:  80 ml [id number]

[Series 12: thins · axial · 0.56mm/px · z∈[+926,+1194]mm · 18 of 298 slices shown]
[im 15/298  lung]
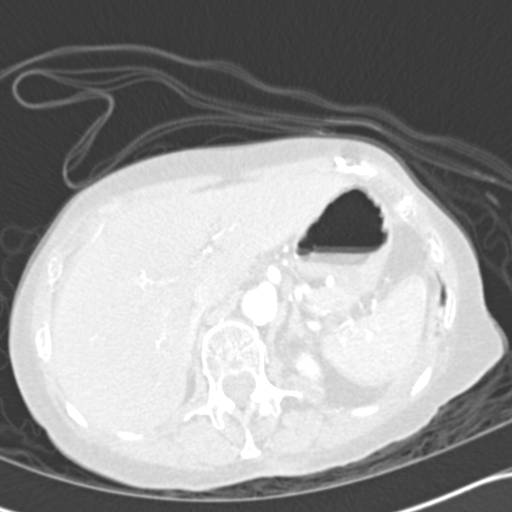
[im 30/298  mediastinal]
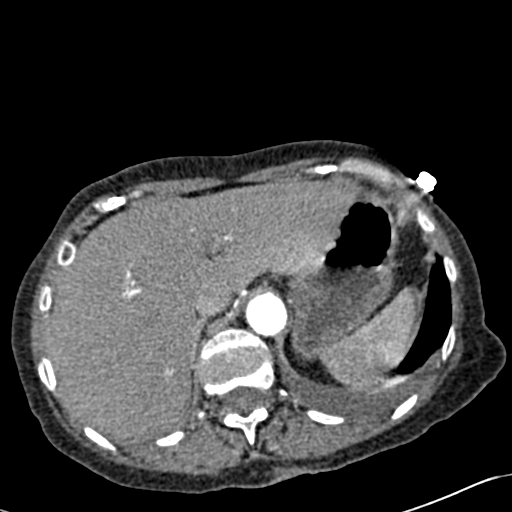
[im 45/298  lung]
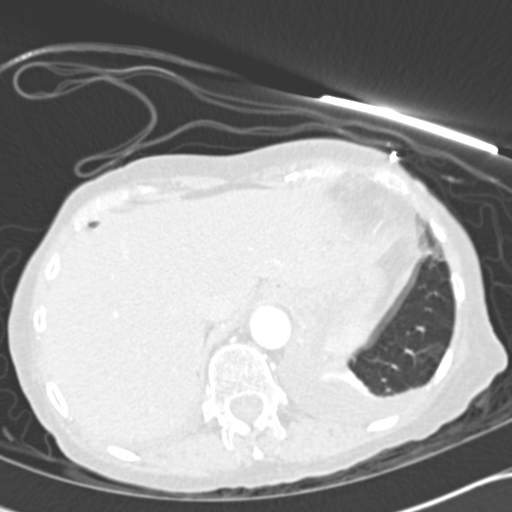
[im 60/298  mediastinal]
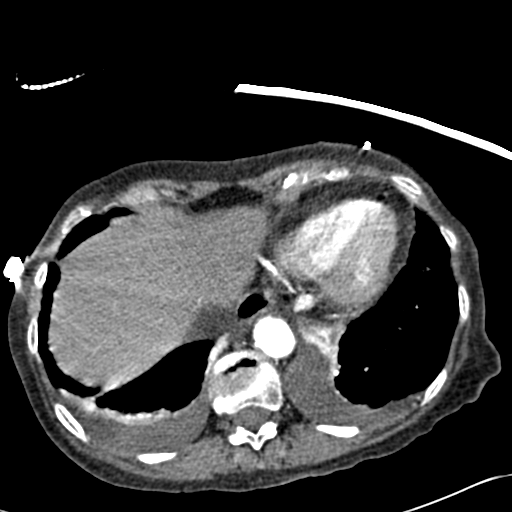
[im 75/298  lung]
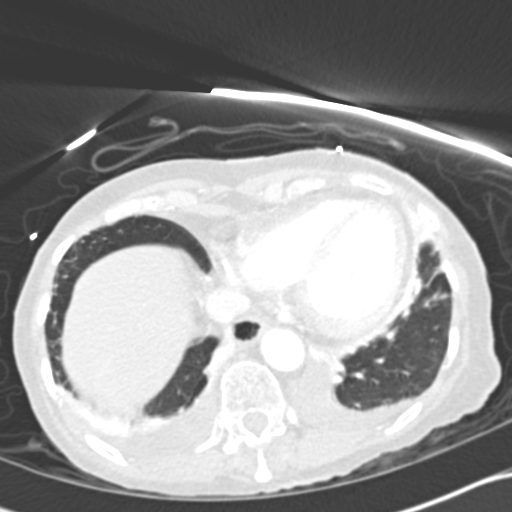
[im 90/298  mediastinal]
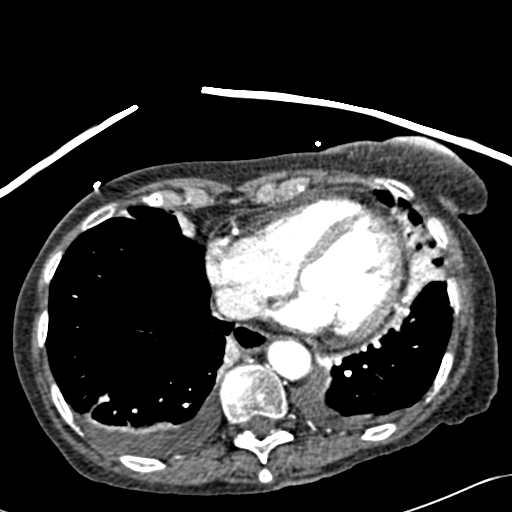
[im 104/298  lung]
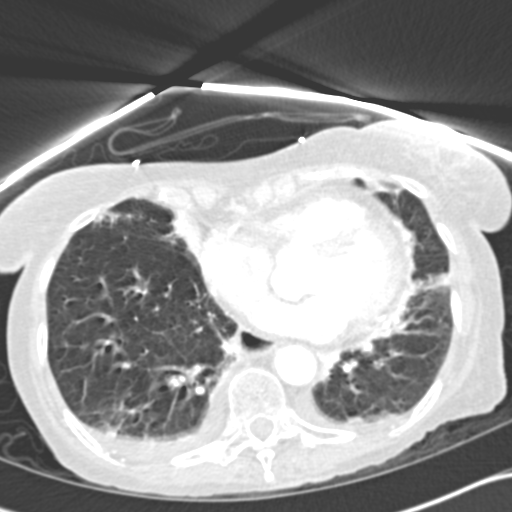
[im 119/298  mediastinal]
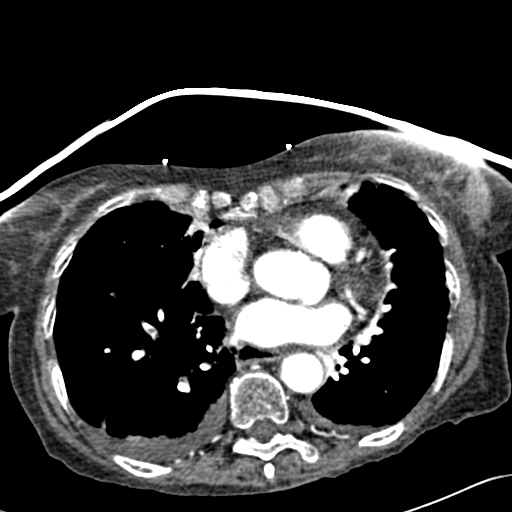
[im 134/298  lung]
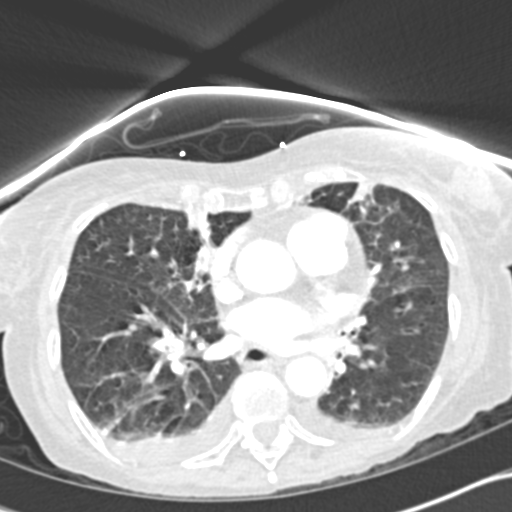
[im 164/298  mediastinal]
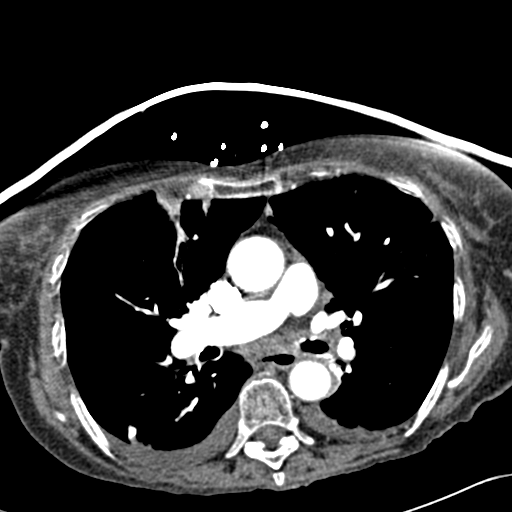
[im 179/298  lung]
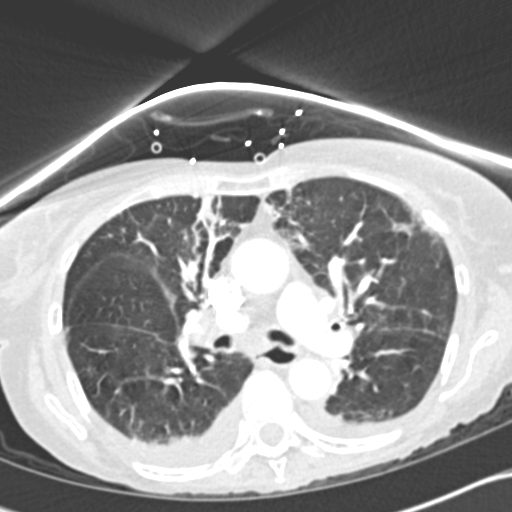
[im 194/298  mediastinal]
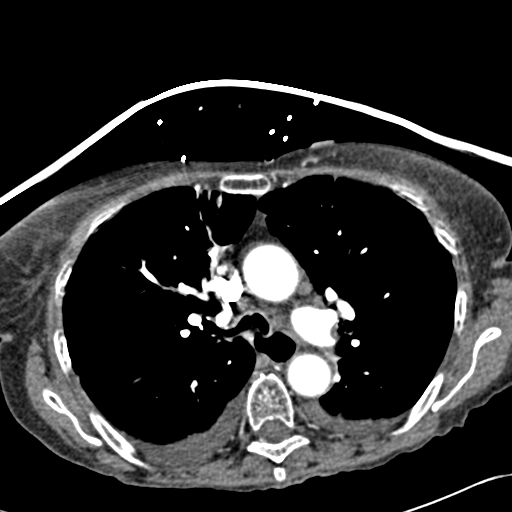
[im 208/298  lung]
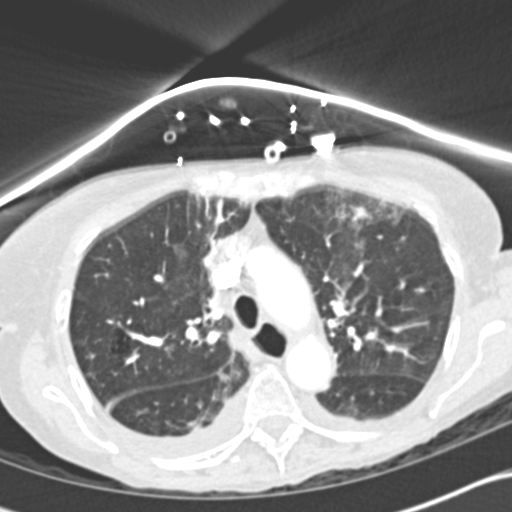
[im 223/298  mediastinal]
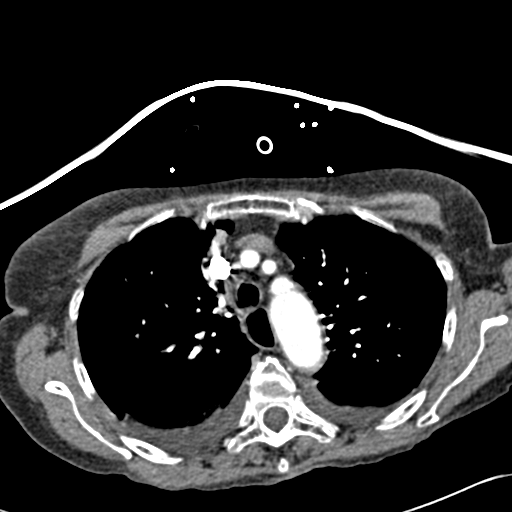
[im 238/298  lung]
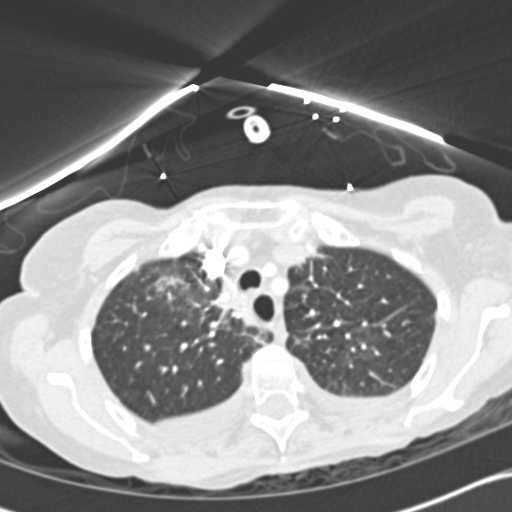
[im 253/298  mediastinal]
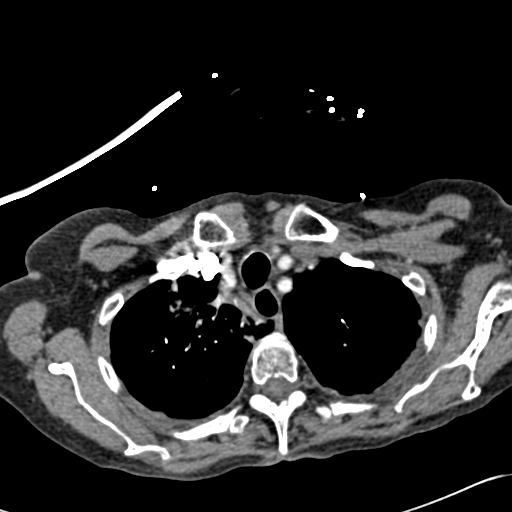
[im 268/298  lung]
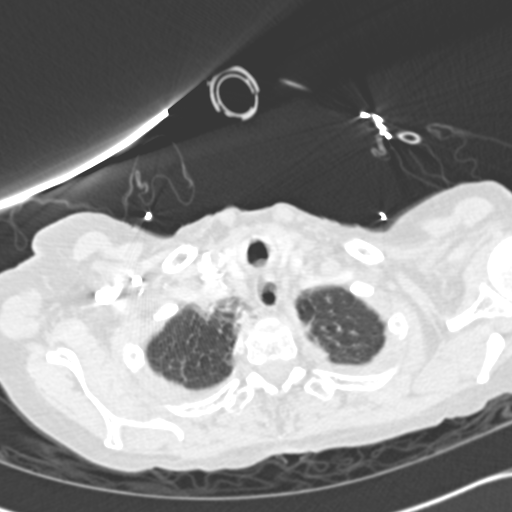
[im 283/298  mediastinal]
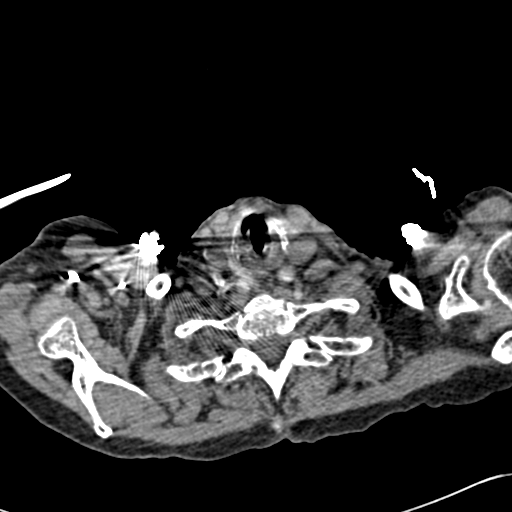

[Series 604: coronal mpr · coronal · 0.58mm/px · 1 of 85 slices shown]
[im 43/85  mediastinal]
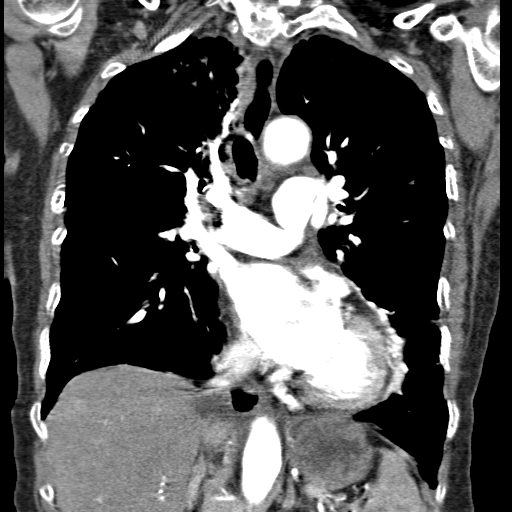

[19 of 36 positions shown; findings below may reference images not displayed]

FINDINGS: Despite efforts by the patient and technologist, motion
artifact is present on some series of today's examination and could
not be totally eliminated.  This reduces diagnostic sensitivity and
specificity.

No filling defect is identified in the pulmonary arterial tree to
suggest pulmonary embolus.

There is increased right paramediastinal atelectasis in the upper
lobe compared to the prior exam, with a band of atelectasis in the
right middle lobe and in the lingula.  Small bilateral pleural
effusions are new.

A right lower lobe calcified granuloma is present.  There is some
thickening of the secondary interlobular septa in the lungs,
possibly related to edema.

Patchy ground-glass opacity is present anteriorly in the left upper
lobe

No aortic dissection identified.  The left ventricle is mildly
prominent.  Small mediastinal lymph nodes appear stable.

Review of the MIP images confirms the above findings.
IMPRESSION: 1.  No embolus identified.  Reduced sensitivity due to breathing
motion artifact.
2.  New bilateral small pleural effusions with patchy ground-glass
opacities and some secondary pulmonary lobular thickening raising
the possibility of pulmonary edema. Bronchiolitis obliterans with
organizing pneumonia is a differential diagnostic consideration.
3.  There is also scattered new atelectasis.

## 2011-06-02 DIAGNOSIS — J309 Allergic rhinitis, unspecified: Secondary | ICD-10-CM | POA: Diagnosis not present

## 2011-07-01 DIAGNOSIS — E789 Disorder of lipoprotein metabolism, unspecified: Secondary | ICD-10-CM | POA: Diagnosis not present

## 2011-07-01 DIAGNOSIS — Z79899 Other long term (current) drug therapy: Secondary | ICD-10-CM | POA: Diagnosis not present

## 2011-07-06 ENCOUNTER — Encounter: Payer: PRIVATE HEALTH INSURANCE | Admitting: Gynecology

## 2011-07-07 ENCOUNTER — Encounter: Payer: Self-pay | Admitting: *Deleted

## 2011-07-07 DIAGNOSIS — M353 Polymyalgia rheumatica: Secondary | ICD-10-CM | POA: Insufficient documentation

## 2011-07-08 ENCOUNTER — Encounter: Payer: PRIVATE HEALTH INSURANCE | Admitting: Gynecology

## 2011-07-14 DIAGNOSIS — L219 Seborrheic dermatitis, unspecified: Secondary | ICD-10-CM | POA: Diagnosis not present

## 2011-07-14 DIAGNOSIS — L299 Pruritus, unspecified: Secondary | ICD-10-CM | POA: Diagnosis not present

## 2011-07-14 DIAGNOSIS — L738 Other specified follicular disorders: Secondary | ICD-10-CM | POA: Diagnosis not present

## 2011-07-14 DIAGNOSIS — L821 Other seborrheic keratosis: Secondary | ICD-10-CM | POA: Diagnosis not present

## 2011-07-16 ENCOUNTER — Encounter: Payer: Self-pay | Admitting: Gynecology

## 2011-07-16 ENCOUNTER — Ambulatory Visit (INDEPENDENT_AMBULATORY_CARE_PROVIDER_SITE_OTHER): Payer: Medicare Other | Admitting: Gynecology

## 2011-07-16 VITALS — BP 128/80 | Ht 59.5 in | Wt 99.0 lb

## 2011-07-16 DIAGNOSIS — N898 Other specified noninflammatory disorders of vagina: Secondary | ICD-10-CM

## 2011-07-16 DIAGNOSIS — N952 Postmenopausal atrophic vaginitis: Secondary | ICD-10-CM | POA: Diagnosis not present

## 2011-07-16 LAB — WET PREP FOR TRICH, YEAST, CLUE: Yeast Wet Prep HPF POC: NONE SEEN

## 2011-07-16 MED ORDER — CLINDAMYCIN PHOSPHATE 2 % VA CREA: 1.0000 | TOPICAL_CREAM | Freq: Every day | VAGINAL | Status: AC

## 2011-07-16 MED ORDER — BETAMETHASONE DIPROPIONATE 0.05 % EX CREA: TOPICAL_CREAM | Freq: Two times a day (BID) | CUTANEOUS | Status: DC

## 2011-07-16 NOTE — Patient Instructions (Signed)
Use vaginal cream nightly x1 week. Apply steroid cream to the burning skin areas. Follow up with your dermatologist if the burning skin symptoms persist.

## 2011-07-16 NOTE — Progress Notes (Signed)
Katrina Burnett 08-09-1924 [id number]        78 y.o.  for follow up complaining of a vaginal discharge primarily in the morning watery yellowish in color. Also bilateral groin burning sensation for the last several months. She apparently had some form of infection involving this area before results will be seen by dermatologist this is all resolved now she has his residual discomfort that comes and goes. Recently saw Dr. Selena Batten and had a complete physical exam with the exception of a breast and pelvic exam.  Past medical history,surgical history, medications, allergies, family history and social history were all reviewed and documented in the EPIC chart. ROS:  Was performed and pertinent positives and negatives are included in the history.  Exam: Amy chaperone present Filed Vitals:   07/16/11 1441  BP: 128/80   General appearance  Normal Skin grossly normal with age related changes Abdominal  soft, nontender, without masses, organomegaly or hernia Breasts  examined lying and sitting without masses, retractions, discharge or axillary adenopathy. Pelvic  Ext/BUS/vagina  normal with atrophic changes slight yellowish discharge  Cervix  normal  With atrophic changes  Uterus  axial, normal size, shape and contour, midline and mobile nontender   Adnexa  Without masses or tenderness    Anus and perineum  normal   Rectovaginal  normal sphincter tone without palpated masses or tenderness.    Assessment/Plan:  78 y.o. female for annual exam.    1. Vaginal discharge. Wet prep suggestive of BV.  Will treat with Cleocin vaginal cream nightly x1 week. Follow up if symptoms persist or recur. 2. Upper groin burning sensation. The patient is pointing to the upper inner thighs. I see no abnormalities on exam nor feel any underlying abnormalities. Am wondering whether she doesn't have some nerve related discomfort. I did suggest we try a steroid cream and prescribed Diprolene 0.05% cream to apply at bedtime and  see if this doesn't help. If her symptoms persist of acid follow up with her dermatologist to see if they have a suggestion. A little nervous about prescribing a more aggressive medication such as Neurontin and will allow her primary to make that decision or the dermatologist. 3. Pap smear. Last Pap smear 2011. No Pap smear was done today. She has numerous normal records in her chart and has no history of abnormal Pap smears before. I reviewed current screening guidelines recommended that we no longer doing Pap smears as she is at this age 76 and she agrees with this. 4. Mammography. Patient has her mammogram scheduled and will follow for this. SBE monthly reviewed. 5. Bone health. She has a bone density scheduled next week per her history and follow up for this. Increase calcium vitamin D reviewed. 6. Colonoscopy. Patient relates having a colonoscopy 2 years ago we'll follow up at their recommended repeat interval. 7. Health maintenance. No blood work or other studies were ordered as this is all done through her primary physician's office.    Dara Lords MD, 5:10 PM 07/16/2011

## 2011-07-17 LAB — URINALYSIS W MICROSCOPIC + REFLEX CULTURE
Casts: NONE SEEN
Crystals: NONE SEEN
Glucose, UA: NEGATIVE mg/dL
Hgb urine dipstick: NEGATIVE
Leukocytes, UA: NEGATIVE
Nitrite: NEGATIVE
Specific Gravity, Urine: 1.01 (ref 1.005–1.030)
Squamous Epithelial / LPF: NONE SEEN
pH: 5.5 (ref 5.0–8.0)

## 2011-07-21 ENCOUNTER — Telehealth: Payer: Self-pay | Admitting: *Deleted

## 2011-07-21 NOTE — Telephone Encounter (Signed)
Patient called to let us know she had diarrhea and horrible stomach cramps.  Was going to stop meds till symptoms got better to see if it was med or may have a virus.  Told patient to eat bland diet and increase fluids to prevent dehydration.  Will call if needs further assistance.

## 2011-07-26 ENCOUNTER — Telehealth: Payer: Self-pay | Admitting: *Deleted

## 2011-07-26 MED ORDER — METRONIDAZOLE 0.75 % VA GEL: 1.0000 | Freq: Two times a day (BID) | VAGINAL | Status: AC

## 2011-07-26 NOTE — Telephone Encounter (Signed)
Pt called c/o vaginal wetness, pt was seen on 07/16/10 and given cleocin 2% vaginal cream. Pt used for 4 days and stopped medication due to stomach pain and diarrhea. Please advise

## 2011-07-26 NOTE — Telephone Encounter (Signed)
I would recommend MetroGel twice daily x5 days and see if this doesn't eradicate her symptoms.

## 2011-07-26 NOTE — Telephone Encounter (Signed)
Pt informed with the below note. 

## 2011-07-29 DIAGNOSIS — Z79899 Other long term (current) drug therapy: Secondary | ICD-10-CM | POA: Diagnosis not present

## 2011-07-29 DIAGNOSIS — M810 Age-related osteoporosis without current pathological fracture: Secondary | ICD-10-CM | POA: Diagnosis not present

## 2011-08-05 DIAGNOSIS — R5381 Other malaise: Secondary | ICD-10-CM | POA: Diagnosis not present

## 2011-08-05 DIAGNOSIS — M810 Age-related osteoporosis without current pathological fracture: Secondary | ICD-10-CM | POA: Diagnosis not present

## 2011-08-05 DIAGNOSIS — R21 Rash and other nonspecific skin eruption: Secondary | ICD-10-CM | POA: Diagnosis not present

## 2011-08-05 DIAGNOSIS — K219 Gastro-esophageal reflux disease without esophagitis: Secondary | ICD-10-CM | POA: Diagnosis not present

## 2011-08-05 DIAGNOSIS — R5383 Other fatigue: Secondary | ICD-10-CM | POA: Diagnosis not present

## 2011-08-17 DIAGNOSIS — L293 Anogenital pruritus, unspecified: Secondary | ICD-10-CM | POA: Diagnosis not present

## 2011-08-20 ENCOUNTER — Telehealth: Payer: Self-pay | Admitting: *Deleted

## 2011-08-20 MED ORDER — FLUCONAZOLE 150 MG PO TABS: 150.0000 mg | ORAL_TABLET | Freq: Once | ORAL | Status: AC

## 2011-08-20 NOTE — Telephone Encounter (Signed)
PT INFORMED WITH THE BELOW NOTE. SHE WILL DO AS DIRECTED.

## 2011-08-20 NOTE — Telephone Encounter (Signed)
PT IS CALLING C/O VAGINAL ITCHING & BURNING AND FEELING DAMP WET FEELING IN HER PANTIES. SHE HAS NO BACK PAIN NOR OTHER DISCOMFORT ONLY THE ABOVE. PT SAID SHE DOESN'T KNOW WHAT TO DO ABOUT THIS WET FEELING.  PLEASE ADVISE

## 2011-08-20 NOTE — Telephone Encounter (Signed)
As far as the itching and burning I suggest we try a course of Diflucan. The dampness I am wondering if it may not be some urine leakage. I think if that continues we may want have her see the urologist. It was tried with Diflucan as far as itching and burning.

## 2011-09-10 ENCOUNTER — Other Ambulatory Visit (HOSPITAL_COMMUNITY): Payer: Self-pay | Admitting: Interventional Radiology

## 2011-09-10 DIAGNOSIS — IMO0002 Reserved for concepts with insufficient information to code with codable children: Secondary | ICD-10-CM

## 2011-09-10 DIAGNOSIS — M25559 Pain in unspecified hip: Secondary | ICD-10-CM

## 2011-09-10 DIAGNOSIS — M549 Dorsalgia, unspecified: Secondary | ICD-10-CM

## 2011-09-13 ENCOUNTER — Ambulatory Visit (HOSPITAL_COMMUNITY)
Admission: RE | Admit: 2011-09-13 | Discharge: 2011-09-13 | Disposition: A | Discharge status: Home / Self Care | Payer: Medicare Other | Source: Ambulatory Visit | Attending: Interventional Radiology | Admitting: Interventional Radiology

## 2011-09-13 DIAGNOSIS — M5126 Other intervertebral disc displacement, lumbar region: Secondary | ICD-10-CM | POA: Diagnosis not present

## 2011-09-13 DIAGNOSIS — M25559 Pain in unspecified hip: Secondary | ICD-10-CM | POA: Insufficient documentation

## 2011-09-13 DIAGNOSIS — X58XXXA Exposure to other specified factors, initial encounter: Secondary | ICD-10-CM | POA: Insufficient documentation

## 2011-09-13 DIAGNOSIS — M5137 Other intervertebral disc degeneration, lumbosacral region: Secondary | ICD-10-CM | POA: Diagnosis not present

## 2011-09-13 DIAGNOSIS — S32009A Unspecified fracture of unspecified lumbar vertebra, initial encounter for closed fracture: Secondary | ICD-10-CM | POA: Diagnosis not present

## 2011-09-13 DIAGNOSIS — IMO0002 Reserved for concepts with insufficient information to code with codable children: Secondary | ICD-10-CM

## 2011-09-13 DIAGNOSIS — M51379 Other intervertebral disc degeneration, lumbosacral region without mention of lumbar back pain or lower extremity pain: Secondary | ICD-10-CM | POA: Insufficient documentation

## 2011-09-13 DIAGNOSIS — M8448XA Pathological fracture, other site, initial encounter for fracture: Secondary | ICD-10-CM | POA: Diagnosis not present

## 2011-09-13 DIAGNOSIS — M549 Dorsalgia, unspecified: Secondary | ICD-10-CM

## 2011-09-16 ENCOUNTER — Other Ambulatory Visit (HOSPITAL_COMMUNITY): Payer: Self-pay | Admitting: Interventional Radiology

## 2011-09-16 ENCOUNTER — Encounter (HOSPITAL_COMMUNITY): Payer: Self-pay | Admitting: Pharmacy Technician

## 2011-09-16 DIAGNOSIS — IMO0002 Reserved for concepts with insufficient information to code with codable children: Secondary | ICD-10-CM

## 2011-09-17 ENCOUNTER — Other Ambulatory Visit: Payer: Self-pay | Admitting: Radiology

## 2011-09-20 ENCOUNTER — Encounter (HOSPITAL_COMMUNITY): Payer: Self-pay

## 2011-09-20 ENCOUNTER — Ambulatory Visit (HOSPITAL_COMMUNITY)
Admission: RE | Admit: 2011-09-20 | Discharge: 2011-09-20 | Disposition: A | Discharge status: Home / Self Care | Payer: Medicare Other | Source: Ambulatory Visit | Attending: Interventional Radiology | Admitting: Interventional Radiology

## 2011-09-20 DIAGNOSIS — IMO0002 Reserved for concepts with insufficient information to code with codable children: Secondary | ICD-10-CM

## 2011-09-20 DIAGNOSIS — S32009A Unspecified fracture of unspecified lumbar vertebra, initial encounter for closed fracture: Secondary | ICD-10-CM | POA: Diagnosis not present

## 2011-09-20 DIAGNOSIS — M8448XA Pathological fracture, other site, initial encounter for fracture: Secondary | ICD-10-CM | POA: Diagnosis not present

## 2011-09-20 DIAGNOSIS — S32000A Wedge compression fracture of unspecified lumbar vertebra, initial encounter for closed fracture: Secondary | ICD-10-CM | POA: Insufficient documentation

## 2011-09-20 DIAGNOSIS — X58XXXA Exposure to other specified factors, initial encounter: Secondary | ICD-10-CM | POA: Insufficient documentation

## 2011-09-20 DIAGNOSIS — M545 Low back pain, unspecified: Secondary | ICD-10-CM | POA: Diagnosis not present

## 2011-09-20 HISTORY — DX: Wedge compression fracture of unspecified lumbar vertebra, initial encounter for closed fracture: S32.000A

## 2011-09-20 LAB — BASIC METABOLIC PANEL
BUN: 11 mg/dL (ref 6–23)
CO2: 28 mEq/L (ref 19–32)
Calcium: 9.8 mg/dL (ref 8.4–10.5)
Chloride: 102 mEq/L (ref 96–112)
Creatinine, Ser: 0.78 mg/dL (ref 0.50–1.10)
Glucose, Bld: 75 mg/dL (ref 70–99)

## 2011-09-20 LAB — CBC
HCT: 39.4 % (ref 36.0–46.0)
MCH: 29.2 pg (ref 26.0–34.0)
MCV: 89.7 fL (ref 78.0–100.0)
Platelets: 342 10*3/uL (ref 150–400)
RDW: 13 % (ref 11.5–15.5)
WBC: 5.7 10*3/uL (ref 4.0–10.5)

## 2011-09-20 MED ORDER — MIDAZOLAM HCL 5 MG/5ML IJ SOLN
INTRAMUSCULAR | Status: AC | PRN
  Administered 2011-09-20 (×3): 1 mg via INTRAVENOUS

## 2011-09-20 MED ORDER — FENTANYL CITRATE 0.05 MG/ML IJ SOLN
INTRAMUSCULAR | Status: AC | PRN
  Administered 2011-09-20 (×3): 25 ug via INTRAVENOUS

## 2011-09-20 MED ORDER — HYDROCODONE-ACETAMINOPHEN 5-325 MG PO TABS: 1.0000 | ORAL_TABLET | Freq: Once | ORAL | Status: DC

## 2011-09-20 MED ORDER — CEFAZOLIN SODIUM 1-5 GM-% IV SOLN
INTRAVENOUS | Status: AC
  Filled 2011-09-20: qty 50

## 2011-09-20 MED ORDER — TOBRAMYCIN SULFATE 1.2 G IJ SOLR
INTRAMUSCULAR | Status: AC
  Filled 2011-09-20: qty 1.2

## 2011-09-20 MED ORDER — CEFAZOLIN SODIUM 1-5 GM-% IV SOLN
1.0000 g | Freq: Once | INTRAVENOUS | Status: AC
  Administered 2011-09-20: 1 g via INTRAVENOUS

## 2011-09-20 MED ORDER — HYDRALAZINE HCL 20 MG/ML IJ SOLN
INTRAMUSCULAR | Status: AC
  Filled 2011-09-20: qty 2

## 2011-09-20 MED ORDER — HYDROCODONE-ACETAMINOPHEN 5-325 MG PO TABS
ORAL_TABLET | ORAL | Status: AC
  Administered 2011-09-20: 1
  Filled 2011-09-20: qty 1

## 2011-09-20 MED ORDER — SODIUM CHLORIDE 0.9 % IV SOLN: INTRAVENOUS | Status: AC

## 2011-09-20 MED ORDER — FENTANYL CITRATE 0.05 MG/ML IJ SOLN
INTRAMUSCULAR | Status: AC
  Filled 2011-09-20: qty 4

## 2011-09-20 MED ORDER — MIDAZOLAM HCL 2 MG/2ML IJ SOLN
INTRAMUSCULAR | Status: AC
  Filled 2011-09-20: qty 4

## 2011-09-20 MED ORDER — SODIUM CHLORIDE 0.9 % IV SOLN
Freq: Once | INTRAVENOUS | Status: AC
  Administered 2011-09-20: 10:00:00 via INTRAVENOUS

## 2011-09-20 NOTE — Discharge Instructions (Signed)
KVertebroplasty Care After A vertebroplasty is procedure that helps relieve pain caused by breaks (fractures) in the spine. A special kind of "bone cement" is put into the spine. The cement hardens quickly. It also helps the break stay in a steady state (stabilizes).  HOME CARE  Rest in your bed (bed rest) as told by your doctor.   Do not lift things over 8 pounds (3.6 kilograms) for the next few days.   Return to your normal routine as told by your doctor.   Only take medicine as told by your doctor.   Change bandages (dressings) as told by your doctor.  GET HELP RIGHT AWAY IF:   You are bleeding from the needle site.   You have puffiness (swelling) or redness at the needle site.   You are very sore near the needle site.   Yellowish white fluid (pus) is coming out of the needle site.   You have a temperature by mouth above 102 F (38.9 C), not controlled by medicine.   You have trouble breathing or you feel short of breath.   You throw up (vomit).   You feel sick to your stomach (nauseous).   You have pain or cramping in your belly (abdomen), and it will not stop.  If you go to the Emergency Room, tell the nurse that you had this procedure done. MAKE SURE YOU:  Understand these instructions.   Will watch your condition.   Will get help right away if you are not doing well or get worse.  Document Released: 07/21/2009 Document Revised: 04/15/2011 Document Reviewed: 09/30/2010 [facility] Patient Information 2012 [place], [place].yphoplasty, Balloon Balloon Kyphoplasty is a procedure in which orthopedic balloons are used to gently raise a collapsed vertebral body in an attempt to return it to the correct size and position. This condition is also called a vertebral compression fracture, VCF or VTF. Most often the cause of this collapse is due to osteoporosis of the vertebral body. Osteoporosis is a condition which comes on with aging. Osteoporosis is due to a loss of mineral from  the bone. This causes a softening of the bones. The diagnosis of these fractures is usually made with x-rays and or magnetic resonance imaging (MRI). Some advantages of this procedure are:  Restoration of vertebral body height.   Correction of spinal deformity.   Relatively low complication rate.  The procedure is usually done by orthopedic surgeons, neurosurgeons, interventional radiologists, and interventional neuroradiologists who specialize in treating the spine with balloon kyphoplasty.  This procedure is not useful for:  Patients with young, healthy bones or those who have sustained a vertebral body fracture or collapse in a major accident.   Patients with spinal curvature such as scoliosis or kyphosis that is due to causes other than osteoporosis.   Patients who suffer from spinal stenosis or herniated discs with nerve or spinal cord compression, and loss of neurological function not associated with a vertebral compression fracture.   Patients with known metastatic disease of the spine.  THE BENEFITS OF BALLOON KYPHOPLASTY INCLUDE:  Reduction in back pain. If there is pain due to the procedure, it will typically lessen within two weeks.   Improved quality of life.   Improved mobility (you can get around better).   Improved ability to perform activities of daily living.  PROCEDURE  The kyphoplasty procedure involves the use of a balloon to restore the vertebral body height and shape. This is followed by bone cement to strengthen it. The procedure may be  done under intravenous sedation (going to sleep). The patient may need local anesthetic or general anesthetic. The patient lies face-down on the operating room table. Two X-ray machines are used to show the collapsed bones.  The surgeon makes two small (less than 1/8 inch (3mm)) incisions. A tube is then inserted into the center of the vertebral body. Through this tube, balloons are placed in the vertebral body. Then the balloons are  inflated. This creates a cavity. This pushes the bone back towards its normal height and shape.  Once the cavity is created, the surgeon removes the inflatable balloon. The cement is mixed and used to fill the cavity in a slow and controlled fashion. The cement hardens. Then the surgeon takes out the tubes. The incisions are closed with a single stitch. Patients usually go home the same day. Patients can go back to all normal activities of daily living as soon as possible. There are no restrictions.  RISKS AND COMPLICATIONS As with any surgery, there are potential risks. Although the procedure is designed to minimize risks, complications may occur. Be sure to discuss the risks with your caregiver. Balloon kyphoplasty is not right for all patients. Complications may require more treatments. Complications that can occur, include:  The usual risks of local or general anesthetics apply. These risks depend on the patient's overall health.   Heart attack (myocardial infarction).   Stroke (cerebrovascular accident).   A blockage in the lung (pulmonary embolism - there is a very small chance of the cement traveling to lungs).   There is a small risk of the bone cement leaking from within the boundaries of the vertebral body. In most cases, this rare event does not cause any problems. However, if the cement does leak it may cause:   Pain.   Altered sensation.   Very rarely, paralysis.   Should the cement leak further, more significant surgery may be needed to stop the irritation of the nerves or spinal cord.   In very rare circumstances the cement may irritate or damage the spinal cord or nerves.   Heart stops beating (cardiac arrest).   Excessive bleeding (hemorrhage).   Infection (there is a small chance of the cement block becoming infected at the time of surgery or even years later).  Document Released: 04/01/2004 Document Revised: 04/15/2011 Document Reviewed: 11/05/2008 [facility] Patient  Information 2012 [place], [place].

## 2011-09-20 NOTE — H&P (Signed)
Katrina Burnett is an 78 y.o. female.   Chief Complaint: low back pain Lumbar #2 fracture on MRI Pt has had L#1 KP 10/12/10 Scheduled now for L #2 kyphoplasty HPI: polymyalgia; back pain  Past Medical History  Diagnosis Date  . Polymyalgia   . Lumbar compression fracture     L1 and L2    Past Surgical History  Procedure Date  . Varicose vein surgery   . Fixation kyphoplasty lumbar spine 10/12/10; 09/20/11    Family History  Problem Relation Age of Onset  . Cancer Mother     mother colon   Social History:  reports that she has never smoked. She has never used smokeless tobacco. She reports that she does not drink alcohol or use illicit drugs.  Allergies: No Known Allergies   (Not in a hospital admission)  No results found for this or any previous visit (from the past 48 hour(s)). No results found.  Review of Systems  Constitutional: Negative for fever.  Cardiovascular: Negative for chest pain.  Gastrointestinal: Negative for nausea and vomiting.  Musculoskeletal: Positive for back pain.  Neurological: Positive for headaches.    There were no vitals taken for this visit. Physical Exam  Constitutional: She is oriented to person, place, and time. She appears well-developed.  Cardiovascular: Normal rate, regular rhythm and normal heart sounds.   No murmur heard. Respiratory: Breath sounds normal.  GI: Soft. Bowel sounds are normal. There is no tenderness.  Musculoskeletal: Normal range of motion. She exhibits tenderness.       Painful; slow gait Low back pain  Neurological: She is alert and oriented to person, place, and time.  Skin: Skin is warm and dry.  Psychiatric: She has a normal mood and affect. Her behavior is normal. Judgment and thought content normal.     Assessment/Plan Lumbar #2 fracture Scheduled now for Kyphoplasty Has had L1 KP 10/12/10 Pt and family aware pf procedure benefits and risks and agreeable to proceed. Consent in chart  Conal Shetley  A 09/20/2011, 9:50 AM

## 2011-09-20 NOTE — ED Notes (Signed)
Transported to radiology nurses station pending available short stay bed for remainder of recovery until time of discharge

## 2011-09-20 NOTE — Procedures (Signed)
S/P KP L2

## 2011-09-23 ENCOUNTER — Other Ambulatory Visit (HOSPITAL_COMMUNITY): Payer: Self-pay | Admitting: Interventional Radiology

## 2011-09-23 DIAGNOSIS — IMO0002 Reserved for concepts with insufficient information to code with codable children: Secondary | ICD-10-CM

## 2011-09-24 ENCOUNTER — Telehealth: Payer: Self-pay | Admitting: *Deleted

## 2011-09-24 MED ORDER — FLUCONAZOLE 150 MG PO TABS: 150.0000 mg | ORAL_TABLET | Freq: Once | ORAL | Status: AC

## 2011-09-24 NOTE — Telephone Encounter (Signed)
Diflucan 150 mg x3 days

## 2011-09-24 NOTE — Telephone Encounter (Signed)
Pt is calling c/o yeast infection itching and burning only. Pt has back surgery on Monday and states the itching started after this, she would like something by mouth to take. Pt was given diflucan 150 mg x 5 days back in April. Pt said medication did work at the time. Her urology appointment wont be until June 20th. please advise

## 2011-09-24 NOTE — Telephone Encounter (Signed)
Pt informed with the below note. 

## 2011-09-29 ENCOUNTER — Telehealth (HOSPITAL_COMMUNITY): Payer: Self-pay

## 2011-09-29 NOTE — Telephone Encounter (Signed)
Katrina Burnett phoned to ask if she could ride in a car. Per Dr. Corliss Skains he stated it was ok to ride in a car but not to drive

## 2011-10-01 ENCOUNTER — Telehealth (HOSPITAL_COMMUNITY): Payer: Self-pay

## 2011-10-01 NOTE — Telephone Encounter (Signed)
Spoke with pt// she is experiencing more pain//

## 2011-10-05 ENCOUNTER — Ambulatory Visit (HOSPITAL_COMMUNITY)
Admission: RE | Admit: 2011-10-05 | Discharge: 2011-10-05 | Disposition: A | Discharge status: Home / Self Care | Payer: Medicare Other | Source: Ambulatory Visit | Attending: Interventional Radiology | Admitting: Interventional Radiology

## 2011-10-05 DIAGNOSIS — M8448XA Pathological fracture, other site, initial encounter for fracture: Secondary | ICD-10-CM | POA: Diagnosis not present

## 2011-10-05 DIAGNOSIS — Z5189 Encounter for other specified aftercare: Secondary | ICD-10-CM | POA: Diagnosis not present

## 2011-10-05 DIAGNOSIS — IMO0002 Reserved for concepts with insufficient information to code with codable children: Secondary | ICD-10-CM

## 2011-10-11 DIAGNOSIS — M810 Age-related osteoporosis without current pathological fracture: Secondary | ICD-10-CM | POA: Diagnosis not present

## 2011-10-11 DIAGNOSIS — M549 Dorsalgia, unspecified: Secondary | ICD-10-CM | POA: Diagnosis not present

## 2011-10-14 ENCOUNTER — Telehealth (HOSPITAL_COMMUNITY): Payer: Self-pay

## 2011-10-14 ENCOUNTER — Other Ambulatory Visit (HOSPITAL_COMMUNITY): Payer: Self-pay | Admitting: Interventional Radiology

## 2011-10-14 DIAGNOSIS — M545 Low back pain, unspecified: Secondary | ICD-10-CM

## 2011-10-18 ENCOUNTER — Telehealth (HOSPITAL_COMMUNITY): Payer: Self-pay

## 2011-10-18 ENCOUNTER — Ambulatory Visit (HOSPITAL_COMMUNITY)
Admission: RE | Admit: 2011-10-18 | Discharge: 2011-10-18 | Disposition: A | Discharge status: Home / Self Care | Payer: Medicare Other | Source: Ambulatory Visit | Attending: Interventional Radiology | Admitting: Interventional Radiology

## 2011-10-18 DIAGNOSIS — M5124 Other intervertebral disc displacement, thoracic region: Secondary | ICD-10-CM | POA: Diagnosis not present

## 2011-10-18 DIAGNOSIS — M545 Low back pain, unspecified: Secondary | ICD-10-CM

## 2011-10-18 DIAGNOSIS — B373 Candidiasis of vulva and vagina: Secondary | ICD-10-CM | POA: Diagnosis not present

## 2011-10-18 DIAGNOSIS — M47817 Spondylosis without myelopathy or radiculopathy, lumbosacral region: Secondary | ICD-10-CM | POA: Diagnosis not present

## 2011-10-18 DIAGNOSIS — M5137 Other intervertebral disc degeneration, lumbosacral region: Secondary | ICD-10-CM | POA: Diagnosis not present

## 2011-10-18 DIAGNOSIS — M25559 Pain in unspecified hip: Secondary | ICD-10-CM | POA: Diagnosis not present

## 2011-10-18 DIAGNOSIS — M5126 Other intervertebral disc displacement, lumbar region: Secondary | ICD-10-CM | POA: Diagnosis not present

## 2011-10-18 NOTE — Telephone Encounter (Signed)
I called Katrina Burnett to make her aware that the MRI showed no new fractures

## 2011-10-18 NOTE — Telephone Encounter (Signed)
Katrina Burnett left a vm that she is still in great pain.  I left her a vm to give me a call.  I also see that her MRI is done.  The report has not bee read at this point though.

## 2011-10-18 NOTE — Telephone Encounter (Signed)
Dr. Corliss Skains instructed me to tell the pt that she does not have any new fractures

## 2011-10-21 ENCOUNTER — Telehealth (HOSPITAL_COMMUNITY): Payer: Self-pay

## 2011-10-21 NOTE — Telephone Encounter (Signed)
Spoke with Margorie John about her MRI results.  I advised her that Dr. Corliss Skains stated there are no new fractures. She stated that it is getting better

## 2011-10-27 DIAGNOSIS — K2270 Barrett's esophagus without dysplasia: Secondary | ICD-10-CM | POA: Diagnosis not present

## 2011-10-27 DIAGNOSIS — Z1212 Encounter for screening for malignant neoplasm of rectum: Secondary | ICD-10-CM | POA: Diagnosis not present

## 2011-10-27 DIAGNOSIS — Z79899 Other long term (current) drug therapy: Secondary | ICD-10-CM | POA: Diagnosis not present

## 2011-10-27 DIAGNOSIS — B373 Candidiasis of vulva and vagina: Secondary | ICD-10-CM | POA: Diagnosis not present

## 2011-10-27 DIAGNOSIS — R197 Diarrhea, unspecified: Secondary | ICD-10-CM | POA: Diagnosis not present

## 2011-11-02 DIAGNOSIS — Z1231 Encounter for screening mammogram for malignant neoplasm of breast: Secondary | ICD-10-CM | POA: Diagnosis not present

## 2011-11-15 DIAGNOSIS — K219 Gastro-esophageal reflux disease without esophagitis: Secondary | ICD-10-CM | POA: Diagnosis not present

## 2011-11-15 DIAGNOSIS — N760 Acute vaginitis: Secondary | ICD-10-CM | POA: Diagnosis not present

## 2011-11-15 DIAGNOSIS — R32 Unspecified urinary incontinence: Secondary | ICD-10-CM | POA: Diagnosis not present

## 2011-11-15 DIAGNOSIS — K2270 Barrett's esophagus without dysplasia: Secondary | ICD-10-CM | POA: Diagnosis not present

## 2011-11-16 ENCOUNTER — Encounter: Payer: Self-pay | Admitting: Gastroenterology

## 2011-11-18 ENCOUNTER — Encounter: Payer: Self-pay | Admitting: Gynecology

## 2011-11-18 DIAGNOSIS — N3946 Mixed incontinence: Secondary | ICD-10-CM | POA: Diagnosis not present

## 2011-11-18 DIAGNOSIS — N3944 Nocturnal enuresis: Secondary | ICD-10-CM | POA: Diagnosis not present

## 2011-11-23 ENCOUNTER — Other Ambulatory Visit (HOSPITAL_COMMUNITY): Payer: Self-pay | Admitting: *Deleted

## 2011-11-25 ENCOUNTER — Encounter (HOSPITAL_COMMUNITY)
Admission: RE | Admit: 2011-11-25 | Discharge: 2011-11-25 | Disposition: A | Discharge status: Home / Self Care | Payer: Medicare Other | Source: Ambulatory Visit | Attending: Internal Medicine | Admitting: Internal Medicine

## 2011-11-25 DIAGNOSIS — M810 Age-related osteoporosis without current pathological fracture: Secondary | ICD-10-CM | POA: Insufficient documentation

## 2011-11-25 MED ORDER — SODIUM CHLORIDE 0.9 % IV SOLN
Freq: Once | INTRAVENOUS | Status: AC
  Administered 2011-11-25: 11:00:00 via INTRAVENOUS

## 2011-11-25 MED ORDER — ACETAMINOPHEN 325 MG PO TABS
650.0000 mg | ORAL_TABLET | Freq: Once | ORAL | Status: AC
  Administered 2011-11-25: 650 mg via ORAL
  Filled 2011-11-25: qty 2

## 2011-11-25 MED ORDER — IBANDRONATE SODIUM 3 MG/3ML IV SOLN
3.0000 mg | Freq: Once | INTRAVENOUS | Status: AC
  Administered 2011-11-25: 3 mg via INTRAVENOUS
  Filled 2011-11-25: qty 3

## 2011-12-16 ENCOUNTER — Ambulatory Visit (INDEPENDENT_AMBULATORY_CARE_PROVIDER_SITE_OTHER): Payer: Medicare Other | Admitting: Gastroenterology

## 2011-12-16 ENCOUNTER — Encounter: Payer: Self-pay | Admitting: Gastroenterology

## 2011-12-16 ENCOUNTER — Other Ambulatory Visit (INDEPENDENT_AMBULATORY_CARE_PROVIDER_SITE_OTHER): Payer: Medicare Other

## 2011-12-16 VITALS — BP 122/60 | HR 80 | Ht 59.5 in | Wt 97.0 lb

## 2011-12-16 DIAGNOSIS — R109 Unspecified abdominal pain: Secondary | ICD-10-CM | POA: Diagnosis not present

## 2011-12-16 DIAGNOSIS — Z860100 Personal history of colon polyps, unspecified: Secondary | ICD-10-CM | POA: Insufficient documentation

## 2011-12-16 DIAGNOSIS — K219 Gastro-esophageal reflux disease without esophagitis: Secondary | ICD-10-CM

## 2011-12-16 DIAGNOSIS — R141 Gas pain: Secondary | ICD-10-CM | POA: Diagnosis not present

## 2011-12-16 DIAGNOSIS — R143 Flatulence: Secondary | ICD-10-CM | POA: Diagnosis not present

## 2011-12-16 DIAGNOSIS — R142 Eructation: Secondary | ICD-10-CM

## 2011-12-16 LAB — COMPREHENSIVE METABOLIC PANEL
ALT: 17 U/L (ref 0–35)
Alkaline Phosphatase: 101 U/L (ref 39–117)
Glucose, Bld: 82 mg/dL (ref 70–99)
Sodium: 138 mEq/L (ref 135–145)
Total Bilirubin: 0.8 mg/dL (ref 0.3–1.2)
Total Protein: 6.8 g/dL (ref 6.0–8.3)

## 2011-12-16 MED ORDER — GLYCOPYRROLATE 1 MG PO TABS: 1.0000 mg | ORAL_TABLET | Freq: Two times a day (BID) | ORAL | Status: DC

## 2011-12-16 NOTE — Patient Instructions (Addendum)
  You have been scheduled for a CT scan of the abdomen and pelvis at [facility] ([address]---this is in the same building as [facility]).   Your physician has requested that you go to the basement for the following lab work before leaving today:Bmet.  You are scheduled on 12/17/11 at 1:30pm. You should arrive 15 minutes prior to your appointment time for registration. Please follow the written instructions below on the day of your exam:  WARNING: IF YOU ARE ALLERGIC TO IODINE/X-RAY DYE, PLEASE NOTIFY RADIOLOGY IMMEDIATELY AT [phone]! YOU WILL BE GIVEN A 13 HOUR PREMEDICATION PREP.  1) Do not eat or drink anything after 9:30am (4 hours prior to your test) 2) You have been given 2 bottles of oral contrast to drink. The solution may taste               better if refrigerated, but do NOT add ice or any other liquid to this solution. Shake             well before drinking.    Drink 1 bottle of contrast @ 11:30am (2 hours prior to your exam)  Drink 1 bottle of contrast @ 12:30pm (1 hour prior to your exam)  You may take any medications as prescribed with a small amount of water except for the following: Metformin, Glucophage, Glucovance, Avandamet, Riomet, Fortamet, Actoplus Met, Janumet, Glumetza or Metaglip. The above medications must be held the day of the exam AND 48 hours after the exam.  The purpose of you drinking the oral contrast is to aid in the visualization of your intestinal tract. The contrast solution may cause some diarrhea. Before your exam is started, you will be given a small amount of fluid to drink. Depending on your individual set of symptoms, you may also receive an intravenous injection of x-ray contrast/dye. Plan on being at [facility] for 30 minutes or long, depending on the type of exam you are having performed.  If you have any questions regarding your exam or if you need to reschedule, you may call the CT department at [phone]  between the hours of 8:00 am and 5:00 pm, Monday-Friday.  ________________________________________________________________________  Bonita Quin have been given a Low-gas diet.  cc: Pearson Grippe, MD

## 2011-12-16 NOTE — Progress Notes (Addendum)
History of Present Illness: This is an 78 year old female who was previously followed by Dr. Virginia Rochester. She has a long history of GERD that is currently treated with daily Prevacid with good control of symptoms. Biopsies from an upper endoscopy in 1999 revealed a rare focus of Barrett's. Subsequent upper endoscopies in 2001 at 2003 did not show Barrett's. Her most recent colonoscopy was performed in February 2010 for followup of colon polyps showing diverticulosis. She relates problems with intermittent lower abdominal pain and discomfort, gas, bloating and occasional constipation. The symptoms have been present for several months. A CBC and BMET performed in May were unremarkable Denies weight loss, abdominal pain, constipation, diarrhea, change in stool caliber, melena, hematochezia, nausea, vomiting, dysphagia, reflux symptoms, chest pain.  Allergies  Allergen Reactions  . Avelox (Moxifloxacin Hcl In Nacl)     Felt bad  . Biaxin (Clarithromycin) Nausea Only  . Cefuroxime Diarrhea  . Levaquin (Levofloxacin In D5w) Diarrhea  . Minocycline Nausea And Vomiting  . Paxil (Paroxetine Hcl)   . Prednisone Nausea Only  . Remeron (Mirtazapine)   . Trimox (Amoxicillin)   . Viibryd (Vilazodone Hcl)    Outpatient Prescriptions Prior to Visit  Medication Sig Dispense Refill  . azelastine (ASTELIN) 137 MCG/SPRAY nasal spray Place 1 spray into the nose daily. Use in each nostril as directed      . cetirizine (ZYRTEC) 10 MG tablet Take 10 mg by mouth daily.      . cholecalciferol (VITAMIN D) 1000 UNITS tablet Take 1,000 Units by mouth daily.      . citalopram (CELEXA) 40 MG tablet Take 40 mg by mouth daily.      . dorzolamide (TRUSOPT) 2 % ophthalmic solution 1 drop 2 (two) times daily.      . fluticasone (FLONASE) 50 MCG/ACT nasal spray Place into the nose daily.      Marland Kitchen guaiFENesin (MUCINEX) 600 MG 12 hr tablet Take 600 mg by mouth 2 (two) times daily as needed. For congestion/cold      . ibuprofen  (ADVIL,MOTRIN) 200 MG tablet Take 200 mg by mouth every 8 (eight) hours as needed. For pain      . lansoprazole (PREVACID) 30 MG capsule Take 30 mg by mouth daily.      Marland Kitchen latanoprost (XALATAN) 0.005 % ophthalmic solution Place 1 drop into both eyes at bedtime.      Marland Kitchen LORazepam (ATIVAN) 1 MG tablet Take 1-2 mg by mouth every 8 (eight) hours. For anxiety      . Multiple Vitamin (MULTIVITAMIN) tablet Take 1 tablet by mouth daily.      . Probiotic Product (ALIGN) 4 MG CAPS Take 4 mg by mouth daily.       Past Medical History  Diagnosis Date  . Polymyalgia   . Lumbar compression fracture     L1 and L2  . Allergic rhinitis   . Varicose veins   . GERD (gastroesophageal reflux disease)   . Barrett's esophagus 06/1997  . Osteoporosis   . Chondromalacia   . Pneumonia   . Vaginitis   . Diverticulosis   . Tubular adenoma of colon 06/1999  . Glaucoma    Past Surgical History  Procedure Date  . Varicose vein surgery   . Fixation kyphoplasty lumbar spine 10/12/10; 09/20/11  . Lung biopsy    History   Social History  . Marital Status: Widowed    Spouse Name: N/A    Number of Children: 4  . Years of Education: N/A  Occupational History  . Retired    Social History Main Topics  . Smoking status: Never Smoker   . Smokeless tobacco: Never Used   Comment: At age 9 yrs old but nothing since   . Alcohol Use: No  . Drug Use: No  . Sexually Active: None   Other Topics Concern  . None   Social History Narrative   Daily caffeine    Family History  Problem Relation Age of Onset  . Colon cancer Mother 16   Review of Systems: Pertinent positive and negative review of systems were noted in the above HPI section. All other review of systems were otherwise negative.  Physical Exam: General: Well developed , well nourished, no acute distress, appears younger than stated age Head: Normocephalic and atraumatic Eyes:  sclerae anicteric, EOMI Ears: Normal auditory acuity Mouth: No deformity  or lesions Neck: Supple, no masses or thyromegaly Lungs: Clear throughout to auscultation Heart: Regular rate and rhythm; no murmurs, rubs or bruits Abdomen: Soft, mild lower abdominal tenderness to deep palpation without rebound or guarding and non distended. No masses, hepatosplenomegaly or hernias noted. Hyperactive bowel sounds Musculoskeletal: Symmetrical with no gross deformities  Skin: No lesions on visible extremities Pulses:  Normal pulses noted Extremities: No clubbing, cyanosis, edema or deformities noted Neurological: Alert oriented x 4, grossly nonfocal Cervical Nodes:  No significant cervical adenopathy Inguinal Nodes: No significant inguinal adenopathy Psychological:  Alert and cooperative. Normal mood and affect  Assessment and Recommendations:  1. Lower abdominal pain associated with gas, bloating and occasional constipation. Increase dietary fiber and water. Consider a trial of MiraLax. Begin glycopyrrolate 1 mg twice a day. Schedule CT scan of the abdomen and pelvis and obtain stool Hemoccults to further evaluate. She underwent colonoscopy in 2010 so we will not need to repeat this study unless findings on CT and Hemoccults suggest the need for colonoscopy or her symptoms fail to improve. CMET today. Recent CBC unremarkable.  2. Chronic GERD. Standard antireflux measures and continue Prevacid 30 mg daily.  3. Rare focus of Barrett's mucosa noted on endoscopy in 1999. Subsequent endoscopies have not revealed Barrett's so I feel that the diagnosis of Barrett's has been reasonably excluded. In any event she is past the for routine surveillance endoscopies for Barrett's.  4. Personal history of adenomatous colon polyps, initially diagnosed in 2001. Her last surveillance colonoscopy 2010 was unremarkable. No plans for future surveillance colonoscopies due to age.

## 2011-12-17 ENCOUNTER — Ambulatory Visit (INDEPENDENT_AMBULATORY_CARE_PROVIDER_SITE_OTHER)
Admission: RE | Admit: 2011-12-17 | Discharge: 2011-12-17 | Disposition: A | Discharge status: Home / Self Care | Payer: Medicare Other | Source: Ambulatory Visit | Attending: Gastroenterology | Admitting: Gastroenterology

## 2011-12-17 DIAGNOSIS — R109 Unspecified abdominal pain: Secondary | ICD-10-CM

## 2011-12-17 DIAGNOSIS — K5730 Diverticulosis of large intestine without perforation or abscess without bleeding: Secondary | ICD-10-CM | POA: Diagnosis not present

## 2011-12-17 MED ORDER — IOHEXOL 300 MG/ML  SOLN
80.0000 mL | Freq: Once | INTRAMUSCULAR | Status: AC | PRN
  Administered 2011-12-17: 80 mL via INTRAVENOUS

## 2011-12-22 ENCOUNTER — Encounter: Payer: Self-pay | Admitting: Gynecology

## 2011-12-22 ENCOUNTER — Ambulatory Visit (INDEPENDENT_AMBULATORY_CARE_PROVIDER_SITE_OTHER): Payer: Medicare Other | Admitting: Gynecology

## 2011-12-22 DIAGNOSIS — N952 Postmenopausal atrophic vaginitis: Secondary | ICD-10-CM | POA: Diagnosis not present

## 2011-12-22 DIAGNOSIS — N368 Other specified disorders of urethra: Secondary | ICD-10-CM | POA: Diagnosis not present

## 2011-12-22 DIAGNOSIS — N9489 Other specified conditions associated with female genital organs and menstrual cycle: Secondary | ICD-10-CM

## 2011-12-22 NOTE — Progress Notes (Signed)
Patient presents with 2 issues: 1. Recent CT for lower abdominal discomfort showed prominent left adnexal blood vessels questionable pelvic congestion. Uterus/ovaries otherwise noted to be normal. 2. Persistent vulvar upper thigh leg burning sensation at nighttime. Had tried a number of creams per dermatology and recently tried lidocaine which didn't seem to help.  Exam with Sherrilyn Rist assistant External with atrophic changes. No rashes lesions or other abnormalities on the vulva/upper thighs.BUS vagina with mild urethral prolapse. Otherwise atrophic changes. Cervix atrophic. Uterus small midline mobile nontender. Adnexa without masses or tenderness rectovaginal exam is normal  Assessment and plan: 1. CT showing normal pelvis with the exception of prominent vessels on the left. Patient has a history of varicosities of lower extremity also. I reassured her that this is not an unusual finding and no further follow up needs to be done. 2. Vulvar/upper thigh burning at nighttime. Exam is normal. Suspect neurologic in origin. Discussed possible amitriptyline at bedtime or Neurontin. Given her age and other medications am uncomfortable prescribing this but wrote the names down so she can discuss this with her primary physician who prescribes her other medications to see if they feel comfortable with a low dose of either to see if this doesn't help with her symptoms.

## 2011-12-22 NOTE — Patient Instructions (Signed)
Follow up with your primary physician to discuss medication suggestions such as Neurontin or amitriptyline at bedtime. Follow up with me as needed.

## 2011-12-28 DIAGNOSIS — N3944 Nocturnal enuresis: Secondary | ICD-10-CM | POA: Diagnosis not present

## 2011-12-28 DIAGNOSIS — N3946 Mixed incontinence: Secondary | ICD-10-CM | POA: Diagnosis not present

## 2012-01-04 DIAGNOSIS — N3946 Mixed incontinence: Secondary | ICD-10-CM | POA: Diagnosis not present

## 2012-01-04 DIAGNOSIS — N3944 Nocturnal enuresis: Secondary | ICD-10-CM | POA: Diagnosis not present

## 2012-01-07 ENCOUNTER — Telehealth: Payer: Self-pay | Admitting: Gastroenterology

## 2012-01-07 NOTE — Telephone Encounter (Signed)
Patient states she wasn't aware she had a prescription called into her pharmacy after her office visit on 12/16/11. Patient states she wants to know what the prescription is for. Informed patient it is anti-spasmodic medication for her abdominal pain. Pt wants to know if she can take Prevacid and this medication at the same time. Told patient that it is ok she takes both of them at the same time. Pt agreed and verbalized understanding.

## 2012-01-12 DIAGNOSIS — L293 Anogenital pruritus, unspecified: Secondary | ICD-10-CM | POA: Diagnosis not present

## 2012-01-12 DIAGNOSIS — J40 Bronchitis, not specified as acute or chronic: Secondary | ICD-10-CM | POA: Diagnosis not present

## 2012-02-02 DIAGNOSIS — H409 Unspecified glaucoma: Secondary | ICD-10-CM | POA: Diagnosis not present

## 2012-02-02 DIAGNOSIS — H4011X0 Primary open-angle glaucoma, stage unspecified: Secondary | ICD-10-CM | POA: Diagnosis not present

## 2012-02-03 DIAGNOSIS — R5383 Other fatigue: Secondary | ICD-10-CM | POA: Diagnosis not present

## 2012-02-03 DIAGNOSIS — R21 Rash and other nonspecific skin eruption: Secondary | ICD-10-CM | POA: Diagnosis not present

## 2012-02-03 DIAGNOSIS — R5381 Other malaise: Secondary | ICD-10-CM | POA: Diagnosis not present

## 2012-02-03 DIAGNOSIS — K2270 Barrett's esophagus without dysplasia: Secondary | ICD-10-CM | POA: Diagnosis not present

## 2012-02-03 DIAGNOSIS — Z23 Encounter for immunization: Secondary | ICD-10-CM | POA: Diagnosis not present

## 2012-02-17 ENCOUNTER — Encounter (HOSPITAL_COMMUNITY)
Admission: RE | Admit: 2012-02-17 | Discharge: 2012-02-17 | Disposition: A | Discharge status: Home / Self Care | Payer: Medicare Other | Source: Ambulatory Visit | Attending: Internal Medicine | Admitting: Internal Medicine

## 2012-02-17 DIAGNOSIS — M810 Age-related osteoporosis without current pathological fracture: Secondary | ICD-10-CM | POA: Diagnosis not present

## 2012-02-17 MED ORDER — IBANDRONATE SODIUM 3 MG/3ML IV SOLN
INTRAVENOUS | Status: AC
  Administered 2012-02-17: 3 mg via INTRAVENOUS
  Filled 2012-02-17: qty 3

## 2012-02-17 MED ORDER — IBANDRONATE SODIUM 3 MG/3ML IV SOLN
3.0000 mg | Freq: Once | INTRAVENOUS | Status: AC
  Administered 2012-02-17: 3 mg via INTRAVENOUS

## 2012-02-17 MED ORDER — SODIUM CHLORIDE 0.9 % IV SOLN
Freq: Once | INTRAVENOUS | Status: AC
  Administered 2012-02-17: 13:00:00 via INTRAVENOUS

## 2012-03-07 DIAGNOSIS — N3946 Mixed incontinence: Secondary | ICD-10-CM | POA: Diagnosis not present

## 2012-03-07 DIAGNOSIS — N3944 Nocturnal enuresis: Secondary | ICD-10-CM | POA: Diagnosis not present

## 2012-04-11 DIAGNOSIS — M810 Age-related osteoporosis without current pathological fracture: Secondary | ICD-10-CM | POA: Diagnosis not present

## 2012-04-11 DIAGNOSIS — Z79899 Other long term (current) drug therapy: Secondary | ICD-10-CM | POA: Diagnosis not present

## 2012-04-13 DIAGNOSIS — R21 Rash and other nonspecific skin eruption: Secondary | ICD-10-CM | POA: Diagnosis not present

## 2012-04-13 DIAGNOSIS — R5381 Other malaise: Secondary | ICD-10-CM | POA: Diagnosis not present

## 2012-05-04 ENCOUNTER — Ambulatory Visit: Payer: Medicare Other | Admitting: Gynecology

## 2012-05-04 DIAGNOSIS — L299 Pruritus, unspecified: Secondary | ICD-10-CM | POA: Diagnosis not present

## 2012-05-09 ENCOUNTER — Other Ambulatory Visit (HOSPITAL_COMMUNITY): Payer: Self-pay | Admitting: *Deleted

## 2012-05-11 ENCOUNTER — Encounter (HOSPITAL_COMMUNITY)
Admission: RE | Admit: 2012-05-11 | Discharge: 2012-05-11 | Disposition: A | Discharge status: Home / Self Care | Payer: Medicare Other | Source: Ambulatory Visit | Attending: Internal Medicine | Admitting: Internal Medicine

## 2012-05-11 DIAGNOSIS — M810 Age-related osteoporosis without current pathological fracture: Secondary | ICD-10-CM | POA: Insufficient documentation

## 2012-05-11 MED ORDER — IBANDRONATE SODIUM 3 MG/3ML IV SOLN
INTRAVENOUS | Status: AC
  Administered 2012-05-11: 3 mg via INTRAVENOUS
  Filled 2012-05-11: qty 3

## 2012-05-11 MED ORDER — ACETAMINOPHEN 325 MG PO TABS: 650.0000 mg | ORAL_TABLET | Freq: Once | ORAL | Status: DC

## 2012-05-11 MED ORDER — IBANDRONATE SODIUM 3 MG/3ML IV SOLN
3.0000 mg | Freq: Once | INTRAVENOUS | Status: AC
  Administered 2012-05-11: 3 mg via INTRAVENOUS

## 2012-05-11 MED ORDER — SODIUM CHLORIDE 0.9 % IV SOLN
INTRAVENOUS | Status: DC
  Administered 2012-05-11: 13:00:00 via INTRAVENOUS

## 2012-05-18 DIAGNOSIS — B373 Candidiasis of vulva and vagina: Secondary | ICD-10-CM | POA: Diagnosis not present

## 2012-05-18 DIAGNOSIS — R21 Rash and other nonspecific skin eruption: Secondary | ICD-10-CM | POA: Diagnosis not present

## 2012-05-23 DIAGNOSIS — J309 Allergic rhinitis, unspecified: Secondary | ICD-10-CM | POA: Diagnosis not present

## 2012-05-30 ENCOUNTER — Encounter (HOSPITAL_COMMUNITY): Payer: Self-pay | Admitting: *Deleted

## 2012-05-30 ENCOUNTER — Emergency Department (HOSPITAL_COMMUNITY): Payer: Medicare Other

## 2012-05-30 ENCOUNTER — Observation Stay (HOSPITAL_COMMUNITY)
Admission: EM | Admit: 2012-05-30 | Discharge: 2012-06-01 | Disposition: A | Discharge status: Home / Self Care | Payer: Medicare Other | Attending: Internal Medicine | Admitting: Internal Medicine

## 2012-05-30 DIAGNOSIS — Z23 Encounter for immunization: Secondary | ICD-10-CM | POA: Diagnosis not present

## 2012-05-30 DIAGNOSIS — I959 Hypotension, unspecified: Secondary | ICD-10-CM | POA: Diagnosis not present

## 2012-05-30 DIAGNOSIS — J9819 Other pulmonary collapse: Secondary | ICD-10-CM | POA: Diagnosis not present

## 2012-05-30 DIAGNOSIS — M353 Polymyalgia rheumatica: Secondary | ICD-10-CM | POA: Insufficient documentation

## 2012-05-30 DIAGNOSIS — R112 Nausea with vomiting, unspecified: Secondary | ICD-10-CM | POA: Diagnosis present

## 2012-05-30 DIAGNOSIS — M549 Dorsalgia, unspecified: Secondary | ICD-10-CM | POA: Diagnosis not present

## 2012-05-30 DIAGNOSIS — D72829 Elevated white blood cell count, unspecified: Secondary | ICD-10-CM | POA: Insufficient documentation

## 2012-05-30 DIAGNOSIS — K044 Acute apical periodontitis of pulpal origin: Secondary | ICD-10-CM | POA: Diagnosis not present

## 2012-05-30 DIAGNOSIS — E860 Dehydration: Secondary | ICD-10-CM | POA: Diagnosis not present

## 2012-05-30 DIAGNOSIS — S32009A Unspecified fracture of unspecified lumbar vertebra, initial encounter for closed fracture: Secondary | ICD-10-CM | POA: Diagnosis not present

## 2012-05-30 DIAGNOSIS — S41109A Unspecified open wound of unspecified upper arm, initial encounter: Secondary | ICD-10-CM | POA: Diagnosis not present

## 2012-05-30 DIAGNOSIS — IMO0002 Reserved for concepts with insufficient information to code with codable children: Secondary | ICD-10-CM | POA: Diagnosis not present

## 2012-05-30 DIAGNOSIS — R791 Abnormal coagulation profile: Secondary | ICD-10-CM | POA: Diagnosis not present

## 2012-05-30 DIAGNOSIS — K219 Gastro-esophageal reflux disease without esophagitis: Secondary | ICD-10-CM | POA: Insufficient documentation

## 2012-05-30 DIAGNOSIS — R55 Syncope and collapse: Principal | ICD-10-CM | POA: Insufficient documentation

## 2012-05-30 DIAGNOSIS — T364X5A Adverse effect of tetracyclines, initial encounter: Secondary | ICD-10-CM | POA: Insufficient documentation

## 2012-05-30 DIAGNOSIS — R1110 Vomiting, unspecified: Secondary | ICD-10-CM

## 2012-05-30 DIAGNOSIS — Z79899 Other long term (current) drug therapy: Secondary | ICD-10-CM | POA: Diagnosis not present

## 2012-05-30 DIAGNOSIS — T148XXA Other injury of unspecified body region, initial encounter: Secondary | ICD-10-CM | POA: Diagnosis not present

## 2012-05-30 DIAGNOSIS — R079 Chest pain, unspecified: Secondary | ICD-10-CM | POA: Insufficient documentation

## 2012-05-30 DIAGNOSIS — R51 Headache: Secondary | ICD-10-CM | POA: Diagnosis not present

## 2012-05-30 DIAGNOSIS — S32000A Wedge compression fracture of unspecified lumbar vertebra, initial encounter for closed fracture: Secondary | ICD-10-CM

## 2012-05-30 LAB — CBC WITH DIFFERENTIAL/PLATELET
HCT: 43.1 % (ref 36.0–46.0)
Hemoglobin: 14.1 g/dL (ref 12.0–15.0)
Lymphs Abs: 0.1 10*3/uL — ABNORMAL LOW (ref 0.7–4.0)
Monocytes Absolute: 0 10*3/uL — ABNORMAL LOW (ref 0.1–1.0)
Monocytes Relative: 1 % — ABNORMAL LOW (ref 3–12)
Neutro Abs: 1.5 10*3/uL — ABNORMAL LOW (ref 1.7–7.7)
Neutrophils Relative %: 91 % — ABNORMAL HIGH (ref 43–77)
RBC: 4.7 MIL/uL (ref 3.87–5.11)

## 2012-05-30 LAB — LIPASE, BLOOD: Lipase: 35 U/L (ref 11–59)

## 2012-05-30 LAB — COMPREHENSIVE METABOLIC PANEL
Alkaline Phosphatase: 84 U/L (ref 39–117)
BUN: 16 mg/dL (ref 6–23)
CO2: 21 mEq/L (ref 19–32)
Chloride: 106 mEq/L (ref 96–112)
Creatinine, Ser: 0.77 mg/dL (ref 0.50–1.10)
GFR calc Af Amer: 86 mL/min — ABNORMAL LOW (ref >90)
GFR calc non Af Amer: 74 mL/min — ABNORMAL LOW (ref >90)
Glucose, Bld: 70 mg/dL (ref 70–99)
Potassium: 3.9 mEq/L (ref 3.5–5.1)
Total Bilirubin: 0.5 mg/dL (ref 0.3–1.2)

## 2012-05-30 LAB — URINALYSIS, ROUTINE W REFLEX MICROSCOPIC
Glucose, UA: NEGATIVE mg/dL
Hgb urine dipstick: NEGATIVE
Leukocytes, UA: NEGATIVE

## 2012-05-30 LAB — LACTIC ACID, PLASMA: Lactic Acid, Venous: 2.2 mmol/L (ref 0.5–2.2)

## 2012-05-30 LAB — D-DIMER, QUANTITATIVE: D-Dimer, Quant: 5.6 ug/mL-FEU — ABNORMAL HIGH (ref 0.00–0.48)

## 2012-05-30 LAB — TROPONIN I: Troponin I: <0.3 ng/mL (ref <0.30)

## 2012-05-30 MED ORDER — ONDANSETRON HCL 4 MG/2ML IJ SOLN: 4.0000 mg | Freq: Four times a day (QID) | INTRAMUSCULAR | Status: DC | PRN

## 2012-05-30 MED ORDER — TETANUS-DIPHTH-ACELL PERTUSSIS 5-2.5-18.5 LF-MCG/0.5 IM SUSP
0.5000 mL | Freq: Once | INTRAMUSCULAR | Status: AC
  Administered 2012-05-30: 0.5 mL via INTRAMUSCULAR
  Filled 2012-05-30: qty 0.5

## 2012-05-30 MED ORDER — ONDANSETRON HCL 4 MG/2ML IJ SOLN: 4.0000 mg | Freq: Three times a day (TID) | INTRAMUSCULAR | Status: AC | PRN

## 2012-05-30 MED ORDER — ONDANSETRON HCL 4 MG/2ML IJ SOLN: 4.0000 mg | Freq: Once | INTRAMUSCULAR | Status: DC

## 2012-05-30 MED ORDER — ONDANSETRON HCL 4 MG PO TABS: 4.0000 mg | ORAL_TABLET | Freq: Four times a day (QID) | ORAL | Status: DC | PRN

## 2012-05-30 MED ORDER — CITALOPRAM HYDROBROMIDE 40 MG PO TABS
40.0000 mg | ORAL_TABLET | Freq: Every day | ORAL | Status: DC
  Administered 2012-05-31: 40 mg via ORAL
  Filled 2012-05-30: qty 1

## 2012-05-30 MED ORDER — LATANOPROST 0.005 % OP SOLN
1.0000 [drp] | Freq: Every day | OPHTHALMIC | Status: DC
  Administered 2012-05-30 – 2012-05-31 (×2): 1 [drp] via OPHTHALMIC
  Filled 2012-05-30: qty 2.5

## 2012-05-30 MED ORDER — PANTOPRAZOLE SODIUM 20 MG PO TBEC
20.0000 mg | DELAYED_RELEASE_TABLET | Freq: Every day | ORAL | Status: DC
  Administered 2012-05-30 – 2012-05-31 (×2): 20 mg via ORAL
  Filled 2012-05-30 (×2): qty 1

## 2012-05-30 MED ORDER — IOHEXOL 350 MG/ML SOLN
100.0000 mL | Freq: Once | INTRAVENOUS | Status: AC | PRN
  Administered 2012-05-30: 80 mL via INTRAVENOUS

## 2012-05-30 MED ORDER — MORPHINE SULFATE 2 MG/ML IJ SOLN
2.0000 mg | Freq: Once | INTRAMUSCULAR | Status: AC
  Administered 2012-05-30: 2 mg via INTRAVENOUS
  Filled 2012-05-30: qty 1

## 2012-05-30 MED ORDER — SODIUM CHLORIDE 0.9 % IV SOLN
INTRAVENOUS | Status: AC
  Administered 2012-05-30: 21:00:00 via INTRAVENOUS

## 2012-05-30 MED ORDER — ONDANSETRON HCL 4 MG/2ML IJ SOLN
4.0000 mg | Freq: Once | INTRAMUSCULAR | Status: AC
  Administered 2012-05-30: 4 mg via INTRAVENOUS
  Filled 2012-05-30: qty 2

## 2012-05-30 MED ORDER — DOXYCYCLINE HYCLATE 100 MG PO TABS
100.0000 mg | ORAL_TABLET | Freq: Two times a day (BID) | ORAL | Status: DC
  Administered 2012-05-30 – 2012-06-01 (×4): 100 mg via ORAL
  Filled 2012-05-30 (×5): qty 1

## 2012-05-30 MED ORDER — ENOXAPARIN SODIUM 40 MG/0.4ML ~~LOC~~ SOLN
40.0000 mg | SUBCUTANEOUS | Status: DC
  Administered 2012-05-30 – 2012-05-31 (×2): 40 mg via SUBCUTANEOUS
  Filled 2012-05-30 (×3): qty 0.4

## 2012-05-30 MED ORDER — SODIUM CHLORIDE 0.9 % IJ SOLN
3.0000 mL | Freq: Two times a day (BID) | INTRAMUSCULAR | Status: DC
  Administered 2012-05-31 – 2012-06-01 (×3): 3 mL via INTRAVENOUS

## 2012-05-30 MED ORDER — HYDROCODONE-ACETAMINOPHEN 5-325 MG PO TABS
1.0000 | ORAL_TABLET | ORAL | Status: DC | PRN
  Administered 2012-05-30: 1 via ORAL
  Administered 2012-05-31: 2 via ORAL
  Administered 2012-05-31 (×3): 1 via ORAL
  Filled 2012-05-30 (×2): qty 1
  Filled 2012-05-30: qty 2
  Filled 2012-05-30 (×2): qty 1

## 2012-05-30 MED ORDER — SODIUM CHLORIDE 0.9 % IV BOLUS (SEPSIS)
1000.0000 mL | Freq: Once | INTRAVENOUS | Status: AC
  Administered 2012-05-30: 1000 mL via INTRAVENOUS

## 2012-05-30 MED ORDER — LORAZEPAM 1 MG PO TABS
1.0000 mg | ORAL_TABLET | Freq: Three times a day (TID) | ORAL | Status: DC
  Administered 2012-05-30 – 2012-06-01 (×5): 2 mg via ORAL
  Administered 2012-06-01: 1 mg via ORAL
  Filled 2012-05-30 (×5): qty 2
  Filled 2012-05-30: qty 1

## 2012-05-30 MED ORDER — SODIUM CHLORIDE 0.9 % IV SOLN: INTRAVENOUS | Status: AC

## 2012-05-30 MED ORDER — ALUM & MAG HYDROXIDE-SIMETH 200-200-20 MG/5ML PO SUSP: 30.0000 mL | Freq: Four times a day (QID) | ORAL | Status: DC | PRN

## 2012-05-30 NOTE — ED Notes (Signed)
Pt states that she has not urinated in 2 days.  

## 2012-05-30 NOTE — ED Notes (Signed)
EKG completed and given to Dr. Deretha Emory.

## 2012-05-30 NOTE — ED Notes (Signed)
[id number]<AV> Expected date:<BR> Expected time:<BR> Means of arrival:<BR> Comments:<BR> 67 yof syncope, NV

## 2012-05-30 NOTE — H&P (Signed)
PCP:   Pearson Grippe, MD   Chief Complaint:  Passed out  HPI: 79 yo female woke up this am feeling fine.  She was dx with a tooth implant infection yesterday by her dentist and started on minocycline.  She took this this am and soon after started getting nauseas and vomiting.  She had several episodes of vomiting.  Got up to go to br and passed out.  Her son witnessed the syncopal event.  No shaking.  No recent fevers.  No diarrhea.  She now has come chest wall pain after falling down.  No pain elsewhere.  No abd pain.  No focal neuro def.    Review of Systems:  O/w neg  Past Medical History: Past Medical History  Diagnosis Date  . Polymyalgia   . Lumbar compression fracture     L1 and L2  . Allergic rhinitis   . Varicose veins   . GERD (gastroesophageal reflux disease)   . Barrett's esophagus 06/1997  . Osteoporosis   . Chondromalacia   . Pneumonia   . Vaginitis   . Diverticulosis   . Tubular adenoma of colon 06/1999  . Glaucoma    Past Surgical History  Procedure Date  . Varicose vein surgery   . Fixation kyphoplasty lumbar spine 10/12/10; 09/20/11  . Lung biopsy     Medications: Prior to Admission medications   Medication Sig Start Date End Date Taking? Authorizing Provider  azelastine (ASTELIN) 137 MCG/SPRAY nasal spray Place 1 spray into the nose daily. Use in each nostril as directed   Yes Historical Provider, MD  cetirizine (ZYRTEC) 10 MG tablet Take 10 mg by mouth daily.   Yes Historical Provider, MD  cholecalciferol (VITAMIN D) 1000 UNITS tablet Take 1,000 Units by mouth daily.   Yes Historical Provider, MD  citalopram (CELEXA) 40 MG tablet Take 40 mg by mouth daily.   Yes Historical Provider, MD  dorzolamide (TRUSOPT) 2 % ophthalmic solution 1 drop 2 (two) times daily.   Yes Historical Provider, MD  fluticasone (FLONASE) 50 MCG/ACT nasal spray Place into the nose daily.   Yes Historical Provider, MD  ibuprofen (ADVIL,MOTRIN) 200 MG tablet Take 200 mg by mouth every 8  (eight) hours as needed. For pain   Yes Historical Provider, MD  lansoprazole (PREVACID) 30 MG capsule Take 30 mg by mouth daily.   Yes Historical Provider, MD  latanoprost (XALATAN) 0.005 % ophthalmic solution Place 1 drop into both eyes at bedtime.   Yes Historical Provider, MD  LORazepam (ATIVAN) 1 MG tablet Take 1-2 mg by mouth every 8 (eight) hours. For anxiety   Yes Historical Provider, MD  methylphenidate (RITALIN) 5 MG tablet Take 2.5 mg by mouth daily as needed. Depression symptoms   Yes Historical Provider, MD  minocycline (DYNACIN) 100 MG tablet Take 100 mg by mouth 2 (two) times daily.   Yes Historical Provider, MD  Multiple Vitamin (MULTIVITAMIN) tablet Take 1 tablet by mouth daily.   Yes Historical Provider, MD  Probiotic Product (ALIGN) 4 MG CAPS Take 4 mg by mouth daily.   Yes Historical Provider, MD  Ibandronate Sodium (BONIVA IV) Inject into the vein as directed. EVERY 3 MONTHS    Historical Provider, MD    Allergies:   Allergies  Allergen Reactions  . Avelox (Moxifloxacin Hcl In Nacl)     Felt bad  . Biaxin (Clarithromycin) Nausea Only  . Cefuroxime Diarrhea  . Levaquin (Levofloxacin In D5w) Diarrhea  . Minocycline Nausea And Vomiting  .  Paxil (Paroxetine Hcl) Nausea And Vomiting  . Prednisone Nausea Only  . Remeron (Mirtazapine)     Doesn't remember   . Trimox (Amoxicillin) Other (See Comments)    Doesn't remember   . Viibryd (Vilazodone Hcl) Other (See Comments)    Doesn't remember     Social History:  reports that she has never smoked. She has never used smokeless tobacco. She reports that she does not drink alcohol or use illicit drugs.  Family History: Family History  Problem Relation Age of Onset  . Colon cancer Mother 75    Physical Exam: Filed Vitals:   05/30/12 1420 05/30/12 1445 05/30/12 1515 05/30/12 1545  BP: 101/49 115/68 110/69 100/43  Pulse: 81 80 82 82  Temp: 98.2 F (36.8 C)     TempSrc: Oral     Resp: 20 18 10 16   SpO2: 90% 96% 97%  96%   General appearance: alert, cooperative and no distress Neck: no JVD and supple, symmetrical, trachea midline Lungs: clear to auscultation bilaterally Heart: regular rate and rhythm, S1, S2 normal, no murmur, click, rub or gallop Abdomen: soft, non-tender; bowel sounds normal; no masses,  no organomegaly Extremities: extremities normal, atraumatic, no cyanosis or edema Pulses: 2+ and symmetric Skin: Skin color, texture, turgor normal. No rashes or lesions Neurologic: Grossly normal    Labs on Admission:   [facility] 05/30/12 1450  NA 139  K 3.9  CL 106  CO2 21  GLUCOSE 70  BUN 16  CREATININE 0.77  CALCIUM 9.4  MG --  PHOS --    Basename 05/30/12 1450  AST 19  ALT 11  ALKPHOS 84  BILITOT 0.5  PROT 6.2  ALBUMIN 3.4*    Basename 05/30/12 1450  LIPASE 35  AMYLASE --    Basename 05/30/12 1450  WBC 1.6*  NEUTROABS 1.5*  HGB 14.1  HCT 43.1  MCV 91.7  PLT 188    Basename 05/30/12 1540  CKTOTAL --  CKMB --  CKMBINDEX --  TROPONINI <0.30   Radiological Exams on Admission: Dg Chest 2 View  05/30/2012  *RADIOLOGY REPORT*  Clinical Data: 79 year old female with chest pain and syncope.  CHEST - 2 VIEW  Comparison: 03/13/2010 chest radiograph  Findings: The cardiomediastinal silhouette is unremarkable. 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 There is no evidence of focal airspace disease, pulmonary edema, suspicious pulmonary nodule/mass, pleural effusion, or pneumothorax. No acute bony abnormalities are identified. Evidence of prior vertebral augmentation in the upper lumbar spine noted.  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   Original Report Authenticated By: Harmon Pier, M.D.    Dg Thoracic Spine W/swimmers  05/30/2012  *RADIOLOGY REPORT*  Clinical Data: Fall with mid back pain.  THORACIC SPINE - 2 VIEW + SWIMMERS  Comparison: 03/13/2010 chest radiograph and 10/18/2011 MRI lumbar spine.  Findings: Vertebral augmentation at L1  and L2 again noted with unchanged grade 1 spondylolisthesis at T12-L1. There is no evidence of acute fracture or subluxation. No focal bony lesions are present.  IMPRESSION: No evidence of acute bony abnormality.  Vertebral augmentation and L1 and L2 with unchanged grade 1 spondylolisthesis at T12-L1.   Original Report Authenticated By: Harmon Pier, M.D.    Ct Head Wo Contrast  05/30/2012  *RADIOLOGY REPORT*  Clinical Data: Headache, syncope  CT HEAD WITHOUT CONTRAST  Technique:  Contiguous axial images were obtained from the base of the skull through the vertex without contrast.  Comparison: 05/24/2009  Findings: No skull fracture is noted.  There is  mild mucosal thickening posterior aspect right sphenoid sinus.  Mild mucosal thickening with partial opacification right ethmoid air cells.  The mastoid air cells are unremarkable.  No intracranial hemorrhage, mass effect or midline shift.  Stable cerebral atrophy.  No acute cortical infarction.  No mass lesion is noted on this unenhanced scan.  Mild periventricular white matter decreased attenuation probable due to chronic small vessel ischemic changes.  IMPRESSION: No acute intracranial abnormality.  Stable cerebral atrophy.  Mild periventricular white matter decreased attenuation probable due to chronic small vessel ischemic changes.   Original Report Authenticated By: Natasha Mead, M.D.    Ct Angio Chest Pe W/cm &/or Wo Cm  05/30/2012  *RADIOLOGY REPORT*  Clinical Data: 79 year old female with chest pain, shortness of breath and elevated D-dimer.  CT ANGIOGRAPHY CHEST  Technique:  Multidetector CT imaging of the chest using the standard protocol during bolus administration of intravenous contrast. Multiplanar reconstructed images including MIPs were obtained and reviewed to evaluate the vascular anatomy.  Contrast: 80mL OMNIPAQUE IOHEXOL 350 MG/ML SOLN  Comparison: 06/01/2009 chest CT and prior abdominal CTs. 05/30/2012 and prior chest radiographs  Findings: This  is a technically satisfactory study.  No pulmonary emboli are identified. There is no evidence of thoracic aortic aneurysm. Cardiomegaly is identified.  There are no pleural or pericardial effusions present. No enlarged lymph nodes are identified.  Moderate peribronchial thickening is identified, specifically in the lower lobes.  Bilateral lower lung atelectasis is noted. There is no evidence of focal airspace disease, consolidation, pulmonary mass, or endobronchial/endotracheal lesions.  Mild prominence of the pancreatic duct is unchanged from 05/24/2009. No other upper abdominal abnormality identified. No acute or suspicious bony abnormalities are identified.  IMPRESSION: No evidence of pulmonary emboli.  Peribronchial thickening with mild to moderate bilateral lower lung atelectasis.  Cardiomegaly.   Original Report Authenticated By: Harmon Pier, M.D.     Assessment/Plan  79 yo female with syncopal event after n/v probably from minocycline Principal Problem:  *Syncope Active Problems:  Polymyalgia  Nausea & vomiting  Chest pain  Hypotension  She was mildly hypotensive on arrival, resolved with iv fluids.  Ck lactic acid level.  obs overnight on ivf.  Romi.  Ck echo in am.  Change to doxy for her tooth.  No focal neuro deficits and abd exam is benign.  Has had no vomiting since arrival.  ekg shows nonspecific tw inv will repeat in am also.  Cp likely due to falling.  ua is pending.    Norah Fick A 05/30/2012, 6:51 PM

## 2012-05-30 NOTE — ED Notes (Signed)
Pt arrives from home by [facility] reports passing out and falling to floor in house. Pt did lose consciousness. Pt also reports nausea. 8mg  Zofran given IV by EMS.

## 2012-05-30 NOTE — ED Provider Notes (Signed)
History     CSN: [id number]  Arrival date & time 05/30/12  1406   First MD Initiated Contact with Patient 05/30/12 1500      Chief Complaint  Patient presents with  . Fall  . Nausea  . Emesis    (Consider location/radiation/quality/duration/timing/severity/associated sxs/prior treatment) HPI Comments: Patient states she felt normal when she woke up this morning and shortly after started feeling poorly. She states she started to feel dizzy while she was in the kitchen and began to sweat. She states the next thing she remembered was waking up on the floor and she had cut her arm. She walked into her living room and started to become nauseated. She then vomited multiple times. After a period of time which she is unclear of she got up to go to the bathroom and had one regular bowel movement and when she was coming back (dizzy and lightheaded again and had a second syncopal episode. She does not know if she hit her head but has been experiencing a headache. She states since the vomiting she's had some chest tightness and mild shortness of breath but denies any abdominal pain or cough. She states she recently went on an antibiotic for a tooth implant that she started yesterday. She denies ever having a history of syncope in the past  Patient is a 79 y.o. female presenting with vomiting and syncope. The history is provided by the patient.  Emesis  Associated symptoms include headaches. Pertinent negatives include no abdominal pain and no fever.  Loss of Consciousness This is a new problem. The current episode started 1 to 2 hours ago. Episode frequency: 2 episodes today. The problem has been resolved. Associated symptoms include chest pain, headaches and shortness of breath. Pertinent negatives include no abdominal pain. The symptoms are aggravated by walking and standing. The symptoms are relieved by lying down. She has tried nothing for the symptoms. The treatment provided mild relief.    Past  Medical History  Diagnosis Date  . Polymyalgia   . Lumbar compression fracture     L1 and L2  . Allergic rhinitis   . Varicose veins   . GERD (gastroesophageal reflux disease)   . Barrett's esophagus 06/1997  . Osteoporosis   . Chondromalacia   . Pneumonia   . Vaginitis   . Diverticulosis   . Tubular adenoma of colon 06/1999  . Glaucoma     Past Surgical History  Procedure Date  . Varicose vein surgery   . Fixation kyphoplasty lumbar spine 10/12/10; 09/20/11  . Lung biopsy     Family History  Problem Relation Age of Onset  . Colon cancer Mother 10    History  Substance Use Topics  . Smoking status: Never Smoker   . Smokeless tobacco: Never Used     Comment: At age 73 yrs old but nothing since   . Alcohol Use: No    OB History    Grav Para Term Preterm Abortions TAB SAB Ect Mult Living   4 4        4       Review of Systems  Constitutional: Negative for fever.  Respiratory: Positive for shortness of breath.   Cardiovascular: Positive for chest pain and syncope.  Gastrointestinal: Negative for abdominal pain.  Neurological: Positive for headaches.  All other systems reviewed and are negative.    Allergies  Avelox; Biaxin; Cefuroxime; Levaquin; Minocycline; Paxil; Prednisone; Remeron; Trimox; and Viibryd  Home Medications   Current Outpatient Rx  Name  Route  Sig  Dispense  Refill  . AZELASTINE HCL 137 MCG/SPRAY NA SOLN   Nasal   Place 1 spray into the nose daily. Use in each nostril as directed         . CETIRIZINE HCL 10 MG PO TABS   Oral   Take 10 mg by mouth daily.         Marland Kitchen VITAMIN D 1000 UNITS PO TABS   Oral   Take 1,000 Units by mouth daily.         Marland Kitchen CITALOPRAM HYDROBROMIDE 40 MG PO TABS   Oral   Take 40 mg by mouth daily.         . DORZOLAMIDE HCL 2 % OP SOLN      1 drop 2 (two) times daily.         Marland Kitchen FLUTICASONE PROPIONATE 50 MCG/ACT NA SUSP   Nasal   Place into the nose daily.         . IBUPROFEN 200 MG PO TABS    Oral   Take 200 mg by mouth every 8 (eight) hours as needed. For pain         . LANSOPRAZOLE 30 MG PO CPDR   Oral   Take 30 mg by mouth daily.         Marland Kitchen LATANOPROST 0.005 % OP SOLN   Both Eyes   Place 1 drop into both eyes at bedtime.         Marland Kitchen LORAZEPAM 1 MG PO TABS   Oral   Take 1-2 mg by mouth every 8 (eight) hours. For anxiety         . METHYLPHENIDATE HCL 5 MG PO TABS   Oral   Take 2.5 mg by mouth daily as needed. Depression symptoms         . MINOCYCLINE HCL 100 MG PO TABS   Oral   Take 100 mg by mouth 2 (two) times daily.         Marland Kitchen ONE-DAILY MULTI VITAMINS PO TABS   Oral   Take 1 tablet by mouth daily.         Marland Kitchen ALIGN 4 MG PO CAPS   Oral   Take 4 mg by mouth daily.         Marland Kitchen BONIVA IV   Intravenous   Inject into the vein as directed. EVERY 3 MONTHS           BP 101/49  Pulse 81  Temp 98.2 F (36.8 C) (Oral)  Resp 20  SpO2 90%  Physical Exam  Nursing note and vitals reviewed. Constitutional: She is oriented to person, place, and time. She appears well-developed and well-nourished. She appears distressed.  HENT:  Head: Normocephalic and atraumatic.  Mouth/Throat: Oropharynx is clear and moist.  Eyes: Conjunctivae normal and EOM are normal. Pupils are equal, round, and reactive to light.  Neck: Normal range of motion. Neck supple.  Cardiovascular: Normal rate, regular rhythm and intact distal pulses.   No murmur heard. Pulmonary/Chest: Effort normal and breath sounds normal. No respiratory distress. She has no wheezes. She has no rales.  Abdominal: Soft. She exhibits no distension. There is no tenderness. There is no rebound and no guarding.  Musculoskeletal: Normal range of motion. She exhibits no edema and no tenderness.  Neurological: She is alert and oriented to person, place, and time.  Skin: Skin is warm. No rash noted. She is diaphoretic. No erythema.     Psychiatric:  She has a normal mood and affect. Her behavior is normal.     ED Course  Procedures (including critical care time)  Labs Reviewed  CBC WITH DIFFERENTIAL - Abnormal; Notable for the following:    WBC 1.6 (*)     Neutrophils Relative 91 (*)     Neutro Abs 1.5 (*)     Lymphocytes Relative 7 (*)     Lymphs Abs 0.1 (*)     Monocytes Relative 1 (*)     Monocytes Absolute 0.0 (*)     All other components within normal limits  COMPREHENSIVE METABOLIC PANEL - Abnormal; Notable for the following:    Albumin 3.4 (*)     GFR calc non Af Amer 74 (*)     GFR calc Af Amer 86 (*)     All other components within normal limits  LIPASE, BLOOD  TROPONIN I  URINALYSIS, ROUTINE W REFLEX MICROSCOPIC  D-DIMER, QUANTITATIVE   Dg Chest 2 View  05/30/2012  *RADIOLOGY REPORT*  Clinical Data: 79 year old female with chest pain and syncope.  CHEST - 2 VIEW  Comparison: 03/13/2010 chest radiograph  Findings: The cardiomediastinal silhouette is unremarkable. 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 There is no evidence of focal airspace disease, pulmonary edema, suspicious pulmonary nodule/mass, pleural effusion, or pneumothorax. No acute bony abnormalities are identified. Evidence of prior vertebral augmentation in the upper lumbar spine noted.  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   Original Report Authenticated By: Harmon Pier, M.D.    Dg Thoracic Spine W/swimmers  05/30/2012  *RADIOLOGY REPORT*  Clinical Data: Fall with mid back pain.  THORACIC SPINE - 2 VIEW + SWIMMERS  Comparison: 03/13/2010 chest radiograph and 10/18/2011 MRI lumbar spine.  Findings: Vertebral augmentation at L1 and L2 again noted with unchanged grade 1 spondylolisthesis at T12-L1. There is no evidence of acute fracture or subluxation. No focal bony lesions are present.  IMPRESSION: No evidence of acute bony abnormality.  Vertebral augmentation and L1 and L2 with unchanged grade 1 spondylolisthesis at T12-L1.   Original Report Authenticated By: Harmon Pier, M.D.     Ct Head Wo Contrast  05/30/2012  *RADIOLOGY REPORT*  Clinical Data: Headache, syncope  CT HEAD WITHOUT CONTRAST  Technique:  Contiguous axial images were obtained from the base of the skull through the vertex without contrast.  Comparison: 05/24/2009  Findings: No skull fracture is noted.  There is mild mucosal thickening posterior aspect right sphenoid sinus.  Mild mucosal thickening with partial opacification right ethmoid air cells.  The mastoid air cells are unremarkable.  No intracranial hemorrhage, mass effect or midline shift.  Stable cerebral atrophy.  No acute cortical infarction.  No mass lesion is noted on this unenhanced scan.  Mild periventricular white matter decreased attenuation probable due to chronic small vessel ischemic changes.  IMPRESSION: No acute intracranial abnormality.  Stable cerebral atrophy.  Mild periventricular white matter decreased attenuation probable due to chronic small vessel ischemic changes.   Original Report Authenticated By: Natasha Mead, M.D.    Ct Angio Chest Pe W/cm &/or Wo Cm  05/30/2012  *RADIOLOGY REPORT*  Clinical Data: 79 year old female with chest pain, shortness of breath and elevated D-dimer.  CT ANGIOGRAPHY CHEST  Technique:  Multidetector CT imaging of the chest using the standard protocol during bolus administration of intravenous contrast. Multiplanar reconstructed images including MIPs were obtained and reviewed to evaluate the vascular anatomy.  Contrast: 80mL OMNIPAQUE IOHEXOL 350 MG/ML SOLN  Comparison: 06/01/2009 chest CT and prior abdominal  CTs. 05/30/2012 and prior chest radiographs  Findings: This is a technically satisfactory study.  No pulmonary emboli are identified. There is no evidence of thoracic aortic aneurysm. Cardiomegaly is identified.  There are no pleural or pericardial effusions present. No enlarged lymph nodes are identified.  Moderate peribronchial thickening is identified, specifically in the lower lobes.  Bilateral lower lung  atelectasis is noted. There is no evidence of focal airspace disease, consolidation, pulmonary mass, or endobronchial/endotracheal lesions.  Mild prominence of the pancreatic duct is unchanged from 05/24/2009. No other upper abdominal abnormality identified. No acute or suspicious bony abnormalities are identified.  IMPRESSION: No evidence of pulmonary emboli.  Peribronchial thickening with mild to moderate bilateral lower lung atelectasis.  Cardiomegaly.   Original Report Authenticated By: Harmon Pier, M.D.     Date: 05/30/2012  Rate: 78  Rhythm: normal sinus rhythm  QRS Axis: normal  Intervals: normal  ST/T Wave abnormalities: nonspecific T wave changes and ST depressions anteriorly  Conduction Disutrbances:none  Narrative Interpretation:   Old EKG Reviewed: unchanged    1. Syncope   2. Hypotension   3. Vomiting       MDM   Patient with 2 episodes of syncope today and persistent vomiting. On arrival by EMS patient was hypotensive and received fluids. On arrival to the ER her Blood pressure has improved to 110/70 when I evaluated the patient. she states she's never had syncope before but was prodromal prior to the syncope with dizziness and new she was going to pass out. She continues to be nauseated but denies any focal abdominal pain. She does complain of some mild shortness of breath and chest pain which is just started since the vomiting. Prior to the syncope she denies any palpitations but did become very sweaty and nauseated. Patient is now complaining of upper back pain since her fall and pain to her arm which sustained 2 skin tears. She has no evidence of broken bones is neurovascularly intact. Concern for cardiac cause for her syncope versus UTI versus medication reaction and she recently started minocycline for a tooth infection.  His EKG is unchanged and shows persistent mild ST depression and T-wave inversion in the anterior laterally. Chest x-ray, head CT and thoracic spine  films show no acute disease. Lipase is within normal limits. CBC with a leukopenia of 1.6 white count but otherwise normal CBC. CMP with normal kidney function and troponin negative. UA and d-dimer patient given pain and nausea control.   6:52 PM D-dimer was elevated however chest CT is negative for PE. UA still pending. Patient will be admitted for syncope workup     Gwyneth Sprout, MD 05/30/12 [phone]

## 2012-05-31 DIAGNOSIS — S32009A Unspecified fracture of unspecified lumbar vertebra, initial encounter for closed fracture: Secondary | ICD-10-CM

## 2012-05-31 DIAGNOSIS — I959 Hypotension, unspecified: Secondary | ICD-10-CM | POA: Diagnosis not present

## 2012-05-31 DIAGNOSIS — K219 Gastro-esophageal reflux disease without esophagitis: Secondary | ICD-10-CM | POA: Diagnosis not present

## 2012-05-31 DIAGNOSIS — R55 Syncope and collapse: Secondary | ICD-10-CM | POA: Diagnosis not present

## 2012-05-31 DIAGNOSIS — I369 Nonrheumatic tricuspid valve disorder, unspecified: Secondary | ICD-10-CM

## 2012-05-31 LAB — BASIC METABOLIC PANEL
BUN: 15 mg/dL (ref 6–23)
CO2: 20 mEq/L (ref 19–32)
Chloride: 106 mEq/L (ref 96–112)
Creatinine, Ser: 0.86 mg/dL (ref 0.50–1.10)

## 2012-05-31 LAB — CBC
HCT: 35.4 % — ABNORMAL LOW (ref 36.0–46.0)
Hemoglobin: 11.6 g/dL — ABNORMAL LOW (ref 12.0–15.0)
MCHC: 32.8 g/dL (ref 30.0–36.0)
MCV: 91.9 fL (ref 78.0–100.0)
RDW: 13.1 % (ref 11.5–15.5)

## 2012-05-31 MED ORDER — CITALOPRAM HYDROBROMIDE 20 MG PO TABS
20.0000 mg | ORAL_TABLET | Freq: Every day | ORAL | Status: DC
  Administered 2012-06-01: 20 mg via ORAL
  Filled 2012-05-31: qty 1

## 2012-05-31 MED ORDER — MECLIZINE HCL 12.5 MG PO TABS
12.5000 mg | ORAL_TABLET | Freq: Two times a day (BID) | ORAL | Status: DC
  Administered 2012-05-31 – 2012-06-01 (×3): 12.5 mg via ORAL
  Filled 2012-05-31 (×4): qty 1

## 2012-05-31 MED ORDER — PANTOPRAZOLE SODIUM 40 MG PO TBEC
40.0000 mg | DELAYED_RELEASE_TABLET | Freq: Every day | ORAL | Status: DC
  Administered 2012-06-01: 40 mg via ORAL
  Filled 2012-05-31: qty 1

## 2012-05-31 NOTE — Care Management Note (Addendum)
    Page 1 of 1   06/01/2012     1:22:42 PM   CARE MANAGEMENT NOTE 06/01/2012  Patient:  IVER, FEHRENBACH   Account Number:  [id number]  Date Initiated:  05/31/2012  Documentation initiated by:  Lanier Clam  Subjective/Objective Assessment:   ADMITTED W/PASSED OUT.ZO:XWRU YESTERDAY DX: TOOTH IMPLANT INFECTION BY DENTIST ON MINOCYCLINE.     Action/Plan:   FROM HOME W/SON.HAS PCP,PHARMACY.   Anticipated DC Date:  06/01/2012   Anticipated DC Plan:  HOME W HOME HEALTH SERVICES      DC Planning Services  CM consult      Choice offered to / List presented to:  C-1 Patient        HH arranged  HH-2 PT  HH-3 OT      [facility] agency  [facility]   Status of service:  Completed, signed off Medicare Important Message given?   (If response is "NO", the following Medicare IM given date fields will be blank) Date Medicare IM given:   Date Additional Medicare IM given:    Discharge Disposition:  HOME W HOME HEALTH SERVICES  Per UR Regulation:  Reviewed for med. necessity/level of care/duration of stay  If discussed at Long Length of Stay Meetings, dates discussed:    Comments:  06/01/12 Adem Costlow RN,BSN,NCM [phone] INFORMED PATIENT THAT HE HAS THE BENEFIT FOR HH.[facility] CHOSEN(KRISTEN) FOR HH,D/C ORDERS IN PLACE.  05/31/12 Imanuel Pruiett RN,BSN NCM [phone] AWAIT PT EVAL & RECOMMENDATIONS.

## 2012-05-31 NOTE — Progress Notes (Signed)
[facility] PROGRESS NOTE  Katrina Burnett UJW:[id number] DOB: 10-Dec-1924 DOA: 05/30/2012 PCP: Pearson Grippe, MD  Brief narrative: 79 year old female admitted 05/30/2012 for her syncopal episode one day prior to this admission. Patient had a recent tooth implant infection and was started on minocycline one day prior to this admission. She reported nausea and vomiting shortly after taking this antibiotic. Per family, patient has lost consciousness 2 times prior to the admission. In ED, CT head was within normal limits. CT thoracic spine showed no fractures. D-dimer on admission was elevated so CT chest done to rule out pulmonary embolism and it was negative. Patient continues to experience dizziness. Physical therapy recommended home health physical therapy. We will start meclizine to see if there is any improvement in symptoms.  Assessment/Plan:  Principal Problem:  *Syncope, dizziness  Less like of cardiac origin; cardiac enzymes x 3 negative; 2 D ECHO on this admission with EF 55-60% and no wall motion abnormalities  Will start meclizine 12.5 mg BID and see if any symptomatic improvement  BP 104/55  PT recommends HHPT  Active Problems: Leukocytosis  Secondary to tooth infection  Continue doxycycline   Esophageal reflux  Continue protonix  Nausea & vomiting  Secondary to minocycline  Resolved  We are using doxycycline for tooth infection instead of minocycline  Hypotension  Secondary to dehydration  Blood pressure 104/55  Code Status: full code Family Communication: no family at bedside Disposition Plan: home with HHPT in am  Manson Passey, MD  [facility] Pager [phone]  If 7PM-7AM, please contact night-coverage [email or web address] Password [facility] 05/31/2012, 7:44 AM   LOS: 1 day   Consultants:  Physical  therapy   Procedures:  None   Antibiotics:  Doxycycline 05/30/2012 -->  HPI/Subjective: Had some nausea yesterday but no vomiting.  Objective: Filed Vitals:    05/30/12 1930 05/30/12 2000 05/30/12 2048 05/31/12 0450  BP: 100/51 110/50 106/67 104/55  Pulse:   100 95  Temp:   98.5 F (36.9 C) 98.4 F (36.9 C)  TempSrc:   Oral Oral  Resp: 19 20 18 16   Height:   5\' 1"  (1.549 m)   Weight:   45.6 kg (100 lb 8.5 oz)   SpO2:   96% 95%    Intake/Output Summary (Last 24 hours) at 05/31/12 0744 Last data filed at 05/31/12 0458  Gross per 24 hour  Intake    425 ml  Output    225 ml  Net    200 ml    Exam:   General:  Pt is alert, follows commands appropriately, not in acute distress  Cardiovascular: Regular rate and rhythm, S1/S2, no murmurs, no rubs, no gallops  Respiratory: Clear to auscultation bilaterally, no wheezing, no crackles, no rhonchi  Abdomen: Soft, non tender, non distended, bowel sounds present, no guarding  Extremities: No edema, pulses DP and PT palpable bilaterally  Neuro: Grossly nonfocal  Data Reviewed: Basic Metabolic Panel:  Lab 05/31/12 6578 05/30/12 1450  NA 133* 139  K 4.5 3.9  CL 106 106  CO2 20 21  GLUCOSE 95 70  BUN 15 16  CREATININE 0.86 0.77  CALCIUM 7.6* 9.4   Liver Function Tests:  Lab 05/30/12 1450  AST 19  ALT 11  ALKPHOS 84  BILITOT 0.5  PROT 6.2  ALBUMIN 3.4*    Lab 05/30/12 1450  LIPASE 35  AMYLASE --   No results found for this basename: AMMONIA:5 in the last 168 hours CBC:  Lab 05/31/12 0235 05/30/12 1450  WBC  14.2* 1.6*  NEUTROABS -- 1.5*  HGB 11.6* 14.1  HCT 35.4* 43.1  MCV 91.9 91.7  PLT 171 188   Cardiac Enzymes:  Lab 05/31/12 0240 05/30/12 1540  CKTOTAL -- --  CKMB -- --  CKMBINDEX -- --  TROPONINI <0.30 <0.30<0.30     Studies: Dg Chest 2 View 05/30/2012  * 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   Original Report Authenticated By: Harmon Pier, M.D.    Dg Thoracic Spine W/swimmers 05/30/2012    IMPRESSION: No evidence of acute bony abnormality.  Vertebral augmentation and L1 and L2 with unchanged grade 1  spondylolisthesis at T12-L1.     Ct Head Wo Contrast 05/30/2012   IMPRESSION: No acute intracranial abnormality.  Stable cerebral atrophy.  Mild periventricular white matter decreased attenuation probable due to chronic small vessel ischemic changes.   Original Report Authenticated By: Natasha Mead, M.D.    Ct Angio Chest Pe W/cm &/or Wo Cm 05/30/2012   IMPRESSION: No evidence of pulmonary emboli.  Peribronchial thickening with mild to moderate bilateral lower lung atelectasis.  Cardiomegaly.      Scheduled Meds:  . citalopram  40 mg Oral Daily  . doxycycline  100 mg Oral Q12H  . enoxaparin (LOVENOX)   40 mg Subcutaneous Q24H  . latanoprost  1 drop Both Eyes QHS  . LORazepam  1-2 mg Oral Q8H  . pantoprazole  20 mg Oral Daily   Continuous Infusions:   . sodium chloride 75 mL/hr at 05/30/12 2120

## 2012-05-31 NOTE — Evaluation (Signed)
Physical Therapy Evaluation Patient Details Name: Katrina Burnett MRN: [id number] DOB: Jul 23, 1924 Today's Date: 05/31/2012 Time: 4098-1191 PT Time Calculation (min): 17 min  PT Assessment / Plan / Recommendation Clinical Impression  Pt admitted for syncope and fall at home.  Pt would benefit from acute PT services in order to safely assist with increasing pt independence with transfers and ambulation while monitoring vitals to prepare pt for d/c home.  Recommend assist for OOB at home due to dizziness with all mobility today and recommended pt use RW upon return home (she reports she doesn't usually use it).  Orthostatics taken and listed in vitals section.    PT Assessment  Patient needs continued PT services    Follow Up Recommendations  Home health PT;Supervision for mobility/OOB    Does the patient have the potential to tolerate intense rehabilitation      Barriers to Discharge        Equipment Recommendations  None recommended by PT    Recommendations for Other Services     Frequency Min 3X/week    Precautions / Restrictions Precautions Precautions: Fall   Pertinent Vitals/Pain No pain except with chest wall during bed mobility  Orthostatics Supine: 76/44 mmHg 80 bpm Sitting: 92/55 mmHg 87 bpm Standing: 99/57 mmHg 90 bpm  SaO2 100% at rest on room air Ambulated without oxygen however pulse ox not reading upon return to room so reapplied O2 Katrina Burnett (pt was wearing upon entering room).      Mobility  Bed Mobility Bed Mobility: Supine to Sit Supine to Sit: 4: Min assist;HOB flat Details for Bed Mobility Assistance: assist for trunk due to chest wall pain from fall at home Transfers Transfers: Sit to Stand;Stand to Sit Sit to Stand: 4: Min assist;With upper extremity assist;From bed Stand to Sit: 4: Min assist;With upper extremity assist;To bed Details for Transfer Assistance: assist to rise and steady and control descent, pt reports dizziness which did not resolve or  become worse Ambulation/Gait Ambulation/Gait Assistance: 4: Min assist Ambulation Distance (Feet): 45 Feet Assistive device: Rolling walker Ambulation/Gait Assistance Details: initially started 1 HHA but pt very unsteady and dizzy so given RW and pt ambulated for 45 feet, limited distance due to dizziness Gait Pattern: Step-through pattern;Trunk flexed;Narrow base of support;Decreased stride length Gait velocity: decreased    Shoulder Instructions     Exercises     PT Diagnosis: Difficulty walking  PT Problem List: Decreased activity tolerance;Decreased mobility;Decreased balance;Decreased safety awareness;Decreased knowledge of use of DME PT Treatment Interventions: Gait training;DME instruction;Functional mobility training;Therapeutic activities;Therapeutic exercise;Patient/family education;Neuromuscular re-education;Balance training   PT Goals Acute Rehab PT Goals PT Goal Formulation: With patient Time For Goal Achievement: 06/07/12 Potential to Achieve Goals: Good Pt will go Supine/Side to Sit: with modified independence PT Goal: Supine/Side to Sit - Progress: Goal set today Pt will go Sit to Stand: with modified independence PT Goal: Sit to Stand - Progress: Goal set today Pt will go Stand to Sit: with modified independence PT Goal: Stand to Sit - Progress: Goal set today Pt will Ambulate: >150 feet;with modified independence;with least restrictive assistive device PT Goal: Ambulate - Progress: Goal set today  Visit Information  Last PT Received On: 05/31/12 Assistance Needed: +1    Subjective Data  Subjective: I'll try honey.   Prior Functioning  Home Living Lives With: Son Available Help at Discharge: Available PRN/intermittently;Family Type of Home: House Home Adaptive Equipment: Walker - rolling Prior Function Level of Independence: Independent Communication Communication: No difficulties  Cognition  Overall Cognitive Status: Appears within functional  limits for tasks assessed/performed Arousal/Alertness: Awake/alert Orientation Level: Appears intact for tasks assessed Behavior During Session: [facility] for tasks performed    Extremity/Trunk Assessment Right Upper Extremity Assessment RUE ROM/Strength/Tone: [facility] for tasks assessed Left Upper Extremity Assessment LUE ROM/Strength/Tone: WFL for tasks assessed Right Lower Extremity Assessment RLE ROM/Strength/Tone: [facility] for tasks assessed Left Lower Extremity Assessment LLE ROM/Strength/Tone: [facility] for tasks assessed   Balance Balance Balance Assessed: Yes Static Standing Balance Static Standing - Balance Support: No upper extremity supported Static Standing - Level of Assistance: 4: Min assist Static Standing - Comment/# of Minutes: min assist for steadying during donning therapist donning back gown and BP reading  End of Session PT - End of Session Activity Tolerance: Other (comment) (limited by dizziness.) Patient left: in bed;with call bell/phone within reach;with bed alarm set Nurse Communication: Other (comment);Mobility status (orthostatics)  GP Functional Assessment Tool Used: clinical judgement Functional Limitation: Mobility: Walking and moving around Mobility: Walking and Moving Around Current Status [phone]): At least 20 percent but less than 40 percent impaired, limited or restricted Mobility: Walking and Moving Around Goal Status [phone]): 0 percent impaired, limited or restricted   Katrina Burnett,Katrina Burnett 05/31/2012, 12:26 PM  Zenovia Jarred, PT, DPT 05/31/2012 Pager: [phone]

## 2012-05-31 NOTE — Progress Notes (Signed)
  Echocardiogram 2D Echocardiogram has been performed.  Katrina Burnett 05/31/2012, 10:27 AM

## 2012-06-01 DIAGNOSIS — R55 Syncope and collapse: Secondary | ICD-10-CM | POA: Diagnosis not present

## 2012-06-01 DIAGNOSIS — I959 Hypotension, unspecified: Secondary | ICD-10-CM | POA: Diagnosis not present

## 2012-06-01 DIAGNOSIS — S32009A Unspecified fracture of unspecified lumbar vertebra, initial encounter for closed fracture: Secondary | ICD-10-CM | POA: Diagnosis not present

## 2012-06-01 DIAGNOSIS — K219 Gastro-esophageal reflux disease without esophagitis: Secondary | ICD-10-CM | POA: Diagnosis not present

## 2012-06-01 LAB — CBC
Hemoglobin: 11.7 g/dL — ABNORMAL LOW (ref 12.0–15.0)
MCH: 29.7 pg (ref 26.0–34.0)
MCHC: 32.3 g/dL (ref 30.0–36.0)
MCV: 91.9 fL (ref 78.0–100.0)
Platelets: 173 10*3/uL (ref 150–400)

## 2012-06-01 LAB — BASIC METABOLIC PANEL
Calcium: 8.2 mg/dL — ABNORMAL LOW (ref 8.4–10.5)
Creatinine, Ser: 0.78 mg/dL (ref 0.50–1.10)
GFR calc non Af Amer: 74 mL/min — ABNORMAL LOW (ref >90)
Glucose, Bld: 84 mg/dL (ref 70–99)
Sodium: 138 mEq/L (ref 135–145)

## 2012-06-01 MED ORDER — DIPHENHYDRAMINE HCL 25 MG PO CAPS
25.0000 mg | ORAL_CAPSULE | ORAL | Status: DC | PRN
  Administered 2012-06-01: 25 mg via ORAL
  Filled 2012-06-01: qty 1

## 2012-06-01 MED ORDER — DIPHENHYDRAMINE HCL 50 MG/ML IJ SOLN
25.0000 mg | Freq: Once | INTRAMUSCULAR | Status: AC
  Administered 2012-06-01: 25 mg via INTRAVENOUS
  Filled 2012-06-01: qty 1

## 2012-06-01 MED ORDER — CITALOPRAM HYDROBROMIDE 40 MG PO TABS: 20.0000 mg | ORAL_TABLET | Freq: Every day | ORAL | Status: DC

## 2012-06-01 MED ORDER — DOXYCYCLINE HYCLATE 100 MG PO TABS: 100.0000 mg | ORAL_TABLET | Freq: Two times a day (BID) | ORAL | Status: DC

## 2012-06-01 MED ORDER — HYDROCODONE-ACETAMINOPHEN 5-325 MG PO TABS: 1.0000 | ORAL_TABLET | ORAL | Status: DC | PRN

## 2012-06-01 MED ORDER — VITAMINS A & D EX OINT
TOPICAL_OINTMENT | CUTANEOUS | Status: AC
  Filled 2012-06-01: qty 5

## 2012-06-01 MED ORDER — MECLIZINE HCL 12.5 MG PO TABS: 12.5000 mg | ORAL_TABLET | Freq: Two times a day (BID) | ORAL | Status: DC

## 2012-06-01 NOTE — Discharge Summary (Signed)
Physician Discharge Summary  Katrina Burnett UJW:[id number] DOB: 1925/03/19 DOA: 05/30/2012  PCP: Pearson Grippe, MD  Admit date: 05/30/2012 Discharge date: 06/01/2012  Discharge Diagnoses:  Principal Problem:  *Syncope Active Problems:  Polymyalgia  Esophageal reflux  Nausea & vomiting  Chest pain  Hypotension  Discharge Condition: medically stable for discharge   Diet recommendation: as tolerated  History of present illness:  79 year old female admitted 05/30/2012 for her syncopal episode one day prior to this admission. Patient had a recent tooth implant infection and was started on minocycline one day prior to this admission. She reported nausea and vomiting shortly after taking this antibiotic. Per family, patient has lost consciousness 2 times prior to the admission.  In ED, CT head was within normal limits. CT thoracic spine showed no fractures. D-dimer on admission was elevated so CT chest done to rule out pulmonary embolism and it was negative.  Patient continued to experience dizziness. Physical therapy recommended home health physical therapy. We started meclizine for possible vertigo.  Assessment/Plan:   Principal Problem:  *Syncope, dizziness  Less like of cardiac origin; cardiac enzymes x 3 negative; 2 D ECHO on this admission with EF 55-60% and no wall motion abnormalities  Will continue meclizine 12.5 mg BID  BP 11662 PT recommends HHPT - order in place  Active Problems:  Leukocytosis  Secondary to tooth infection  Continue doxycycline for 7 days on discharge Esophageal reflux  Continue protonix Nausea & vomiting  Secondary to minocycline  Resolved  We are using doxycycline for tooth infection instead of minocycline Hypotension  Secondary to dehydration  Blood pressure 116/62  Code Status: full code  Family Communication: no family at bedside  Disposition Plan: home today  Manson Passey, MD  [facility]  Pager [phone]  Discharge Exam: Filed Vitals:   06/01/12  0636  BP: 116/62  Pulse: 78  Temp: 97.5 F (36.4 C)  Resp: 16   Filed Vitals:   05/31/12 0450 05/31/12 1400 05/31/12 2104 06/01/12 0636  BP: 104/55 97/49 110/62 116/62  Pulse: 95 62 93 78  Temp: 98.4 F (36.9 C) 98.4 F (36.9 C) 98.2 F (36.8 C) 97.5 F (36.4 C)  TempSrc: Oral Oral Oral Oral  Resp: 16 18 17 16   Height:      Weight:      SpO2: 95% 94% 93% 98%    General: Pt is alert, follows commands appropriately, not in acute distress Cardiovascular: Regular rate and rhythm, S1/S2 +, no murmurs, no rubs, no gallops Respiratory: Clear to auscultation bilaterally, no wheezing, no crackles, no rhonchi Abdominal: Soft, non tender, non distended, bowel sounds +, no guarding Extremities: no edema, no cyanosis, pulses palpable bilaterally DP and PT Neuro: Grossly nonfocal  Discharge Instructions  Discharge Orders    Future Appointments: Provider: Department: Dept Phone: Center:   08/03/2012 1:00 PM [facility] Room 6 [facility] [phone] None     Future Orders Please Complete By Expires   Diet - low sodium heart healthy      Increase activity slowly      Discharge instructions      Comments:   Please take doxycycline for 7 days on discharge  Ativan can contribute to dizziness, lightheadedness in elderly   Call MD for:  persistant nausea and vomiting      Call MD for:  severe uncontrolled pain      Call MD for:  difficulty breathing, headache or visual disturbances      Call MD for:  persistant dizziness or light-headedness          Medication List     As of 06/01/2012  9:57 AM    STOP taking these medications         LORazepam 1 MG tablet   Commonly known as: ATIVAN      minocycline 100 MG tablet   Commonly known as: DYNACIN      TAKE these medications         ALIGN 4 MG Caps   Take 4 mg by mouth daily.      azelastine 137 MCG/SPRAY nasal spray   Commonly known as: ASTELIN   Place 1 spray into the nose daily. Use in each  nostril as directed      BONIVA IV   Inject into the vein as directed. EVERY 3 MONTHS      cetirizine 10 MG tablet   Commonly known as: ZYRTEC   Take 10 mg by mouth daily.      cholecalciferol 1000 UNITS tablet   Commonly known as: VITAMIN D   Take 1,000 Units by mouth daily.      citalopram 40 MG tablet   Commonly known as: CELEXA   Take 0.5 tablets (20 mg total) by mouth daily.      dorzolamide 2 % ophthalmic solution   Commonly known as: TRUSOPT   1 drop 2 (two) times daily.      doxycycline 100 MG tablet   Commonly known as: VIBRA-TABS   Take 1 tablet (100 mg total) by mouth every 12 (twelve) hours.      fluticasone 50 MCG/ACT nasal spray   Commonly known as: FLONASE   Place into the nose daily.      HYDROcodone-acetaminophen 5-325 MG per tablet   Commonly known as: NORCO/VICODIN   Take 1-2 tablets by mouth every 4 (four) hours as needed.      ibuprofen 200 MG tablet   Commonly known as: ADVIL,MOTRIN   Take 200 mg by mouth every 8 (eight) hours as needed. For pain      lansoprazole 30 MG capsule   Commonly known as: PREVACID   Take 30 mg by mouth daily.      latanoprost 0.005 % ophthalmic solution   Commonly known as: XALATAN   Place 1 drop into both eyes at bedtime.      meclizine 12.5 MG tablet   Commonly known as: ANTIVERT   Take 1 tablet (12.5 mg total) by mouth 2 (two) times daily.      methylphenidate 5 MG tablet   Commonly known as: RITALIN   Take 2.5 mg by mouth daily as needed. Depression symptoms      multivitamin tablet   Take 1 tablet by mouth daily.           Follow-up Information    Follow up with Pearson Grippe, MD. In 2 weeks.   Contact information:   [address] [place] [address] [phone]           The results of significant diagnostics from this hospitalization (including imaging, microbiology, ancillary and laboratory) are listed below for reference.    Significant Diagnostic Studies: Dg Chest 2  View  05/30/2012  *RADIOLOGY REPORT*  Clinical Data: 79 year old female with chest pain and syncope.  CHEST - 2 VIEW  Comparison: 03/13/2010 chest radiograph  Findings: The cardiomediastinal silhouette is unremarkable. 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 There is no evidence of focal airspace disease, pulmonary edema, suspicious pulmonary  nodule/mass, pleural effusion, or pneumothorax. No acute bony abnormalities are identified. Evidence of prior vertebral augmentation in the upper lumbar spine noted.  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   Original Report Authenticated By: Harmon Pier, M.D.    Dg Thoracic Spine W/swimmers  05/30/2012  *RADIOLOGY REPORT*  Clinical Data: Fall with mid back pain.  THORACIC SPINE - 2 VIEW + SWIMMERS  Comparison: 03/13/2010 chest radiograph and 10/18/2011 MRI lumbar spine.  Findings: Vertebral augmentation at L1 and L2 again noted with unchanged grade 1 spondylolisthesis at T12-L1. There is no evidence of acute fracture or subluxation. No focal bony lesions are present.  IMPRESSION: No evidence of acute bony abnormality.  Vertebral augmentation and L1 and L2 with unchanged grade 1 spondylolisthesis at T12-L1.   Original Report Authenticated By: Harmon Pier, M.D.    Ct Head Wo Contrast  05/30/2012  *RADIOLOGY REPORT*  Clinical Data: Headache, syncope  CT HEAD WITHOUT CONTRAST  Technique:  Contiguous axial images were obtained from the base of the skull through the vertex without contrast.  Comparison: 05/24/2009  Findings: No skull fracture is noted.  There is mild mucosal thickening posterior aspect right sphenoid sinus.  Mild mucosal thickening with partial opacification right ethmoid air cells.  The mastoid air cells are unremarkable.  No intracranial hemorrhage, mass effect or midline shift.  Stable cerebral atrophy.  No acute cortical infarction.  No mass lesion is noted on this unenhanced scan.  Mild periventricular white matter  decreased attenuation probable due to chronic small vessel ischemic changes.  IMPRESSION: No acute intracranial abnormality.  Stable cerebral atrophy.  Mild periventricular white matter decreased attenuation probable due to chronic small vessel ischemic changes.   Original Report Authenticated By: Natasha Mead, M.D.    Ct Angio Chest Pe W/cm &/or Wo Cm  05/30/2012  *RADIOLOGY REPORT*  Clinical Data: 79 year old female with chest pain, shortness of breath and elevated D-dimer.  CT ANGIOGRAPHY CHEST  Technique:  Multidetector CT imaging of the chest using the standard protocol during bolus administration of intravenous contrast. Multiplanar reconstructed images including MIPs were obtained and reviewed to evaluate the vascular anatomy.  Contrast: 80mL OMNIPAQUE IOHEXOL 350 MG/ML SOLN  Comparison: 06/01/2009 chest CT and prior abdominal CTs. 05/30/2012 and prior chest radiographs  Findings: This is a technically satisfactory study.  No pulmonary emboli are identified. There is no evidence of thoracic aortic aneurysm. Cardiomegaly is identified.  There are no pleural or pericardial effusions present. No enlarged lymph nodes are identified.  Moderate peribronchial thickening is identified, specifically in the lower lobes.  Bilateral lower lung atelectasis is noted. There is no evidence of focal airspace disease, consolidation, pulmonary mass, or endobronchial/endotracheal lesions.  Mild prominence of the pancreatic duct is unchanged from 05/24/2009. No other upper abdominal abnormality identified. No acute or suspicious bony abnormalities are identified.  IMPRESSION: No evidence of pulmonary emboli.  Peribronchial thickening with mild to moderate bilateral lower lung atelectasis.  Cardiomegaly.   Original Report Authenticated By: Harmon Pier, M.D.     Microbiology: No results found for this or any previous visit (from the past 240 hour(s)).   Labs: Basic Metabolic Panel:  Lab 06/01/12 1027 05/31/12 0235 05/30/12  1450  NA 138 133* 139  K 3.6 4.5 3.9  CL 108 106 106  CO2 23 20 21   GLUCOSE 84 95 70  BUN 15 15 16   CREATININE 0.78 0.86 0.77  CALCIUM 8.2* 7.6* 9.4  MG -- -- --  PHOS -- -- --  Liver Function Tests:  Lab 05/30/12 1450  AST 19  ALT 11  ALKPHOS 84  BILITOT 0.5  PROT 6.2  ALBUMIN 3.4*    Lab 05/30/12 1450  LIPASE 35  AMYLASE --   CBC:  Lab 06/01/12 0543 05/31/12 0235 05/30/12 1450  WBC 9.6 14.2* 1.6*  NEUTROABS -- -- 1.5*  HGB 11.7* 11.6* 14.1  HCT 36.2 35.4* 43.1  MCV 91.9 91.9 91.7  PLT 173 171 188   Cardiac Enzymes:  Lab 05/31/12 0835 05/31/12 0240 05/30/12 1540  CKTOTAL -- -- --  CKMB -- -- --  CKMBINDEX -- -- --  TROPONINI <0.30 <0.30 <0.30<0.30   BNP: BNP (last 3 results) No results found for this basename: PROBNP:3 in the last 8760 hours CBG: No results found for this basename: GLUCAP:5 in the last 168 hours  Time coordinating discharge: Over 30 minutes  Signed:  Manson Passey, MD  [facility] 06/01/2012, 9:57 AM  Pager #: [phone]

## 2012-06-07 DIAGNOSIS — M810 Age-related osteoporosis without current pathological fracture: Secondary | ICD-10-CM | POA: Diagnosis not present

## 2012-06-07 DIAGNOSIS — J449 Chronic obstructive pulmonary disease, unspecified: Secondary | ICD-10-CM | POA: Diagnosis not present

## 2012-06-07 DIAGNOSIS — M353 Polymyalgia rheumatica: Secondary | ICD-10-CM | POA: Diagnosis not present

## 2012-06-07 DIAGNOSIS — Z5189 Encounter for other specified aftercare: Secondary | ICD-10-CM | POA: Diagnosis not present

## 2012-06-07 DIAGNOSIS — J438 Other emphysema: Secondary | ICD-10-CM | POA: Diagnosis not present

## 2012-06-07 DIAGNOSIS — D72829 Elevated white blood cell count, unspecified: Secondary | ICD-10-CM | POA: Diagnosis not present

## 2012-06-12 DIAGNOSIS — Z5189 Encounter for other specified aftercare: Secondary | ICD-10-CM | POA: Diagnosis not present

## 2012-06-12 DIAGNOSIS — J449 Chronic obstructive pulmonary disease, unspecified: Secondary | ICD-10-CM | POA: Diagnosis not present

## 2012-06-12 DIAGNOSIS — M810 Age-related osteoporosis without current pathological fracture: Secondary | ICD-10-CM | POA: Diagnosis not present

## 2012-06-12 DIAGNOSIS — M353 Polymyalgia rheumatica: Secondary | ICD-10-CM | POA: Diagnosis not present

## 2012-06-12 DIAGNOSIS — J438 Other emphysema: Secondary | ICD-10-CM | POA: Diagnosis not present

## 2012-06-12 DIAGNOSIS — D72829 Elevated white blood cell count, unspecified: Secondary | ICD-10-CM | POA: Diagnosis not present

## 2012-06-14 DIAGNOSIS — Z5189 Encounter for other specified aftercare: Secondary | ICD-10-CM | POA: Diagnosis not present

## 2012-06-14 DIAGNOSIS — M810 Age-related osteoporosis without current pathological fracture: Secondary | ICD-10-CM | POA: Diagnosis not present

## 2012-06-14 DIAGNOSIS — J438 Other emphysema: Secondary | ICD-10-CM | POA: Diagnosis not present

## 2012-06-14 DIAGNOSIS — J449 Chronic obstructive pulmonary disease, unspecified: Secondary | ICD-10-CM | POA: Diagnosis not present

## 2012-06-14 DIAGNOSIS — M353 Polymyalgia rheumatica: Secondary | ICD-10-CM | POA: Diagnosis not present

## 2012-06-14 DIAGNOSIS — D72829 Elevated white blood cell count, unspecified: Secondary | ICD-10-CM | POA: Diagnosis not present

## 2012-06-15 DIAGNOSIS — K219 Gastro-esophageal reflux disease without esophagitis: Secondary | ICD-10-CM | POA: Diagnosis not present

## 2012-06-15 DIAGNOSIS — R0609 Other forms of dyspnea: Secondary | ICD-10-CM | POA: Diagnosis not present

## 2012-06-15 DIAGNOSIS — R21 Rash and other nonspecific skin eruption: Secondary | ICD-10-CM | POA: Diagnosis not present

## 2012-06-19 DIAGNOSIS — J438 Other emphysema: Secondary | ICD-10-CM | POA: Diagnosis not present

## 2012-06-19 DIAGNOSIS — M353 Polymyalgia rheumatica: Secondary | ICD-10-CM | POA: Diagnosis not present

## 2012-06-19 DIAGNOSIS — J449 Chronic obstructive pulmonary disease, unspecified: Secondary | ICD-10-CM | POA: Diagnosis not present

## 2012-06-19 DIAGNOSIS — M810 Age-related osteoporosis without current pathological fracture: Secondary | ICD-10-CM | POA: Diagnosis not present

## 2012-06-19 DIAGNOSIS — Z5189 Encounter for other specified aftercare: Secondary | ICD-10-CM | POA: Diagnosis not present

## 2012-06-19 DIAGNOSIS — D72829 Elevated white blood cell count, unspecified: Secondary | ICD-10-CM | POA: Diagnosis not present

## 2012-06-23 DIAGNOSIS — D72829 Elevated white blood cell count, unspecified: Secondary | ICD-10-CM | POA: Diagnosis not present

## 2012-06-23 DIAGNOSIS — Z5189 Encounter for other specified aftercare: Secondary | ICD-10-CM | POA: Diagnosis not present

## 2012-06-23 DIAGNOSIS — J438 Other emphysema: Secondary | ICD-10-CM | POA: Diagnosis not present

## 2012-06-23 DIAGNOSIS — J449 Chronic obstructive pulmonary disease, unspecified: Secondary | ICD-10-CM | POA: Diagnosis not present

## 2012-06-23 DIAGNOSIS — M353 Polymyalgia rheumatica: Secondary | ICD-10-CM | POA: Diagnosis not present

## 2012-06-23 DIAGNOSIS — M810 Age-related osteoporosis without current pathological fracture: Secondary | ICD-10-CM | POA: Diagnosis not present

## 2012-06-26 DIAGNOSIS — J438 Other emphysema: Secondary | ICD-10-CM | POA: Diagnosis not present

## 2012-06-26 DIAGNOSIS — Z5189 Encounter for other specified aftercare: Secondary | ICD-10-CM | POA: Diagnosis not present

## 2012-06-26 DIAGNOSIS — D72829 Elevated white blood cell count, unspecified: Secondary | ICD-10-CM | POA: Diagnosis not present

## 2012-06-26 DIAGNOSIS — M810 Age-related osteoporosis without current pathological fracture: Secondary | ICD-10-CM | POA: Diagnosis not present

## 2012-06-26 DIAGNOSIS — J449 Chronic obstructive pulmonary disease, unspecified: Secondary | ICD-10-CM | POA: Diagnosis not present

## 2012-06-26 DIAGNOSIS — M353 Polymyalgia rheumatica: Secondary | ICD-10-CM | POA: Diagnosis not present

## 2012-06-28 DIAGNOSIS — J438 Other emphysema: Secondary | ICD-10-CM | POA: Diagnosis not present

## 2012-06-28 DIAGNOSIS — M353 Polymyalgia rheumatica: Secondary | ICD-10-CM | POA: Diagnosis not present

## 2012-06-28 DIAGNOSIS — J449 Chronic obstructive pulmonary disease, unspecified: Secondary | ICD-10-CM | POA: Diagnosis not present

## 2012-06-28 DIAGNOSIS — M810 Age-related osteoporosis without current pathological fracture: Secondary | ICD-10-CM | POA: Diagnosis not present

## 2012-06-28 DIAGNOSIS — D72829 Elevated white blood cell count, unspecified: Secondary | ICD-10-CM | POA: Diagnosis not present

## 2012-06-28 DIAGNOSIS — Z5189 Encounter for other specified aftercare: Secondary | ICD-10-CM | POA: Diagnosis not present

## 2012-07-03 DIAGNOSIS — M353 Polymyalgia rheumatica: Secondary | ICD-10-CM | POA: Diagnosis not present

## 2012-07-03 DIAGNOSIS — J438 Other emphysema: Secondary | ICD-10-CM | POA: Diagnosis not present

## 2012-07-03 DIAGNOSIS — Z5189 Encounter for other specified aftercare: Secondary | ICD-10-CM | POA: Diagnosis not present

## 2012-07-03 DIAGNOSIS — M810 Age-related osteoporosis without current pathological fracture: Secondary | ICD-10-CM | POA: Diagnosis not present

## 2012-07-03 DIAGNOSIS — D72829 Elevated white blood cell count, unspecified: Secondary | ICD-10-CM | POA: Diagnosis not present

## 2012-07-03 DIAGNOSIS — J449 Chronic obstructive pulmonary disease, unspecified: Secondary | ICD-10-CM | POA: Diagnosis not present

## 2012-07-05 ENCOUNTER — Ambulatory Visit: Payer: Medicare Other | Admitting: Gynecology

## 2012-07-10 ENCOUNTER — Ambulatory Visit: Payer: Medicare Other | Admitting: Gynecology

## 2012-07-11 DIAGNOSIS — M353 Polymyalgia rheumatica: Secondary | ICD-10-CM | POA: Diagnosis not present

## 2012-07-11 DIAGNOSIS — J438 Other emphysema: Secondary | ICD-10-CM | POA: Diagnosis not present

## 2012-07-11 DIAGNOSIS — M810 Age-related osteoporosis without current pathological fracture: Secondary | ICD-10-CM | POA: Diagnosis not present

## 2012-07-11 DIAGNOSIS — J449 Chronic obstructive pulmonary disease, unspecified: Secondary | ICD-10-CM | POA: Diagnosis not present

## 2012-07-11 DIAGNOSIS — Z5189 Encounter for other specified aftercare: Secondary | ICD-10-CM | POA: Diagnosis not present

## 2012-07-11 DIAGNOSIS — D72829 Elevated white blood cell count, unspecified: Secondary | ICD-10-CM | POA: Diagnosis not present

## 2012-07-13 DIAGNOSIS — E7800 Pure hypercholesterolemia, unspecified: Secondary | ICD-10-CM | POA: Diagnosis not present

## 2012-07-13 DIAGNOSIS — K219 Gastro-esophageal reflux disease without esophagitis: Secondary | ICD-10-CM | POA: Diagnosis not present

## 2012-07-13 DIAGNOSIS — M810 Age-related osteoporosis without current pathological fracture: Secondary | ICD-10-CM | POA: Diagnosis not present

## 2012-07-20 DIAGNOSIS — J438 Other emphysema: Secondary | ICD-10-CM | POA: Diagnosis not present

## 2012-07-20 DIAGNOSIS — Z5189 Encounter for other specified aftercare: Secondary | ICD-10-CM | POA: Diagnosis not present

## 2012-07-20 DIAGNOSIS — D72829 Elevated white blood cell count, unspecified: Secondary | ICD-10-CM | POA: Diagnosis not present

## 2012-07-20 DIAGNOSIS — M810 Age-related osteoporosis without current pathological fracture: Secondary | ICD-10-CM | POA: Diagnosis not present

## 2012-07-20 DIAGNOSIS — J449 Chronic obstructive pulmonary disease, unspecified: Secondary | ICD-10-CM | POA: Diagnosis not present

## 2012-07-20 DIAGNOSIS — M353 Polymyalgia rheumatica: Secondary | ICD-10-CM | POA: Diagnosis not present

## 2012-07-21 DIAGNOSIS — L821 Other seborrheic keratosis: Secondary | ICD-10-CM | POA: Diagnosis not present

## 2012-07-21 DIAGNOSIS — L570 Actinic keratosis: Secondary | ICD-10-CM | POA: Diagnosis not present

## 2012-07-21 DIAGNOSIS — L578 Other skin changes due to chronic exposure to nonionizing radiation: Secondary | ICD-10-CM | POA: Diagnosis not present

## 2012-07-21 DIAGNOSIS — L819 Disorder of pigmentation, unspecified: Secondary | ICD-10-CM | POA: Diagnosis not present

## 2012-07-21 DIAGNOSIS — L253 Unspecified contact dermatitis due to other chemical products: Secondary | ICD-10-CM | POA: Diagnosis not present

## 2012-07-21 DIAGNOSIS — L219 Seborrheic dermatitis, unspecified: Secondary | ICD-10-CM | POA: Diagnosis not present

## 2012-07-24 ENCOUNTER — Encounter: Payer: Medicare Other | Admitting: Gynecology

## 2012-07-31 ENCOUNTER — Ambulatory Visit (INDEPENDENT_AMBULATORY_CARE_PROVIDER_SITE_OTHER): Payer: Medicare Other | Admitting: Gynecology

## 2012-07-31 ENCOUNTER — Encounter: Payer: Self-pay | Admitting: Gynecology

## 2012-07-31 VITALS — BP 120/74 | Ht 60.0 in | Wt 96.0 lb

## 2012-07-31 DIAGNOSIS — N952 Postmenopausal atrophic vaginitis: Secondary | ICD-10-CM | POA: Diagnosis not present

## 2012-07-31 DIAGNOSIS — N816 Rectocele: Secondary | ICD-10-CM | POA: Diagnosis not present

## 2012-07-31 DIAGNOSIS — N368 Other specified disorders of urethra: Secondary | ICD-10-CM

## 2012-07-31 NOTE — Patient Instructions (Addendum)
Followup in one year for reexamination. Sooner if any issues. 

## 2012-07-31 NOTE — Progress Notes (Signed)
Katrina Burnett 04-Nov-1924 [id number]        79 y.o.  [id number] for followup exam.  Recently saw her dermatologist who started her on Vusion cream which is a combination of miconazole and zinc oxide and she notes that her skin rash to include vulvar itching has traumatically improved.  Past medical history,surgical history, medications, allergies, family history and social history were all reviewed and documented in the EPIC chart. ROS:  Was performed and pertinent positives and negatives are included in the history.  Exam: Kim assistant Filed Vitals:   07/31/12 1443  BP: 120/74  Height: 5' (1.524 m)  Weight: 96 lb (43.545 kg)   General appearance  Normal Skin grossly normal with age appropriate changes Head/Neck normal with no cervical or supraclavicular adenopathy thyroid normal Lungs  clear Cardiac RR, without RMG Abdominal  soft, nontender, without masses, organomegaly or hernia Breasts  examined lying and sitting without masses, retractions, discharge or axillary adenopathy. Pelvic  Ext/BUS/vagina  With atrophic changes, no lesions or inflammatory changes. Mild urethral prolapse.  Cervix  normal with atrophic changes  Uterus  anteverted, normal size, shape and contour, midline and mobile nontender   Adnexa  Without masses or tenderness    Anus and perineum  normal   Rectovaginal  normal sphincter tone without palpated masses or tenderness. Mild rectocele on rectovaginal exam   Assessment/Plan:  79 y.o. [id number] female for annual exam.   1. Atrophic genital changes with mild rectocele and urethral prolapse. Patient is asymptomatic doing well with the new cream he should continue to use this when necessary per the dermatologist. 2. Osteoporosis historically on IV Boniva. This is all done through her primary care physician and I have no access to the reports. She'll continue followup with them in reference to this. 3. Mammography this past fall. We'll continue with annual mammography.  SBE monthly reviewed. 4. Pap smear 2011. No Pap smear done today. No history of significant abnormal Pap smears. Plan stop screening given her age and negative history. 5. Colonoscopy 2011. Followup at their recommended interval. 6. Health maintenance. No lab work done as it is all done through her primary physician's office. Followup in one year, sooner as needed.    Dara Lords MD, 3:05 PM 07/31/2012

## 2012-08-02 ENCOUNTER — Other Ambulatory Visit (HOSPITAL_COMMUNITY): Payer: Self-pay | Admitting: *Deleted

## 2012-08-03 ENCOUNTER — Encounter (HOSPITAL_COMMUNITY)
Admission: RE | Admit: 2012-08-03 | Discharge: 2012-08-03 | Disposition: A | Discharge status: Home / Self Care | Payer: Medicare Other | Source: Ambulatory Visit | Attending: Internal Medicine | Admitting: Internal Medicine

## 2012-08-03 DIAGNOSIS — M810 Age-related osteoporosis without current pathological fracture: Secondary | ICD-10-CM | POA: Diagnosis not present

## 2012-08-03 MED ORDER — SODIUM CHLORIDE 0.9 % IV SOLN
Freq: Once | INTRAVENOUS | Status: AC
Start: 2012-08-03 — End: 2012-08-03
  Administered 2012-08-03: 13:00:00 via INTRAVENOUS

## 2012-08-03 MED ORDER — IBANDRONATE SODIUM 3 MG/3ML IV SOLN
3.0000 mg | Freq: Once | INTRAVENOUS | Status: AC
  Administered 2012-08-03: 3 mg via INTRAVENOUS

## 2012-08-03 MED ORDER — IBANDRONATE SODIUM 3 MG/3ML IV SOLN
INTRAVENOUS | Status: AC
  Filled 2012-08-03: qty 3

## 2012-08-21 DIAGNOSIS — H4011X0 Primary open-angle glaucoma, stage unspecified: Secondary | ICD-10-CM | POA: Diagnosis not present

## 2012-08-21 DIAGNOSIS — H409 Unspecified glaucoma: Secondary | ICD-10-CM | POA: Diagnosis not present

## 2012-09-01 DIAGNOSIS — L219 Seborrheic dermatitis, unspecified: Secondary | ICD-10-CM | POA: Diagnosis not present

## 2012-10-05 ENCOUNTER — Ambulatory Visit (INDEPENDENT_AMBULATORY_CARE_PROVIDER_SITE_OTHER): Payer: Medicare Other | Admitting: Gastroenterology

## 2012-10-05 ENCOUNTER — Encounter: Payer: Self-pay | Admitting: Gastroenterology

## 2012-10-05 VITALS — BP 100/60 | HR 75 | Ht 59.0 in | Wt 94.0 lb

## 2012-10-05 DIAGNOSIS — R109 Unspecified abdominal pain: Secondary | ICD-10-CM | POA: Diagnosis not present

## 2012-10-05 DIAGNOSIS — K219 Gastro-esophageal reflux disease without esophagitis: Secondary | ICD-10-CM

## 2012-10-05 MED ORDER — GLYCOPYRROLATE 1 MG PO TABS: 1.0000 mg | ORAL_TABLET | Freq: Two times a day (BID) | ORAL | Status: DC

## 2012-10-05 NOTE — Progress Notes (Signed)
History of Present Illness: This is an 79 year old female who was previously followed by Dr. Virginia Rochester. She has a long history of GERD that is currently treated with daily Prevacid with very good symptom control. Biopsies from an upper endoscopy in 1999 revealed a rare focus of Barrett's. Subsequent upper endoscopies in 2001 and 2003 did not show Barrett's. Her most recent colonoscopy was performed in February 2010 by Dr. Virginia Rochester for followup of colon polyps showing diverticulosis. She relates problems with intermittent lower abdominal pain and discomfort, gas, bloating and occasional constipation. The symptoms have been present for several months and with the same symptoms when I last saw her in August. Abdominal/pelvic CT scan for same symptoms as below. She did not try Robinul was recommended to concerns about her glaucoma.  Abd/pelvic CT 12/2011: Stable prominence of the common bile duct and pancreatic duct since 2007.  Diverticulosis with no evidence for associated diverticulitis.  Prominent left pelvic vascularity, question pelvic congestion  syndrome.  No acute process suggested.  Current Medications, Allergies, Past Medical History, Past Surgical History, Family History and Social History were reviewed in [employer] record.  Physical Exam: General: Well developed , well nourished, no acute distress Head: Normocephalic and atraumatic Eyes:  sclerae anicteric, EOMI Ears: Normal auditory acuity Mouth: No deformity or lesions Lungs: Clear throughout to auscultation Heart: Regular rate and rhythm; no murmurs, rubs or bruits Abdomen: Soft, minimal lower abdominal tenderness to deep palpation without rebound or guarding and non distended. No masses, hepatosplenomegaly or hernias noted. Normal Bowel sounds Musculoskeletal: Symmetrical with no gross deformities  Pulses:  Normal pulses noted Extremities: No clubbing, cyanosis, edema or deformities noted Neurological: Alert oriented x  4, grossly nonfocal Psychological:  Alert and cooperative. Normal mood and affect  Assessment and Recommendations:  1. Lower abdominal pain associated with gas, bloating and occasional constipation. Increase dietary fiber and water. Consider a trial of MiraLax. Gas-X 4 times a day when necessary. Begin glycopyrrolate 1 mg twice a day if cleared by her ophthalmologist, Dr. Hazle Quant. She is advised to contact her office if her symptoms persist and do not respond to the above measures.  2. Chronic GERD. Standard antireflux measures and continue Prevacid 30 mg daily.   3. Rare focus of Barrett's mucosa noted on endoscopy in 1999. Subsequent endoscopies have not revealed Barrett's so the diagnosis of Barrett's has been reasonably excluded. In addition, she is past the age for routine surveillance endoscopies for Barrett's.   4. Personal history of adenomatous colon polyps, initially diagnosed in 2001. Her last surveillance colonoscopy in 2010 was unremarkable. No plans for future surveillance colonoscopies due to age.

## 2012-10-05 NOTE — Patient Instructions (Addendum)
We have sent the following medications to your pharmacy for you to pick up at your convenience: Robinul.  Use Gas-X four times a day as needed for gas and bloating.   Thank you for choosing me and [facility].  Venita Lick. Pleas Koch., MD., Clementeen Graham

## 2012-10-16 DIAGNOSIS — Z85828 Personal history of other malignant neoplasm of skin: Secondary | ICD-10-CM | POA: Diagnosis not present

## 2012-10-16 DIAGNOSIS — L253 Unspecified contact dermatitis due to other chemical products: Secondary | ICD-10-CM | POA: Diagnosis not present

## 2012-10-23 DIAGNOSIS — F329 Major depressive disorder, single episode, unspecified: Secondary | ICD-10-CM | POA: Diagnosis not present

## 2012-10-23 DIAGNOSIS — M353 Polymyalgia rheumatica: Secondary | ICD-10-CM | POA: Diagnosis not present

## 2012-10-23 DIAGNOSIS — R42 Dizziness and giddiness: Secondary | ICD-10-CM | POA: Diagnosis not present

## 2012-10-23 DIAGNOSIS — G4700 Insomnia, unspecified: Secondary | ICD-10-CM | POA: Diagnosis not present

## 2012-11-02 ENCOUNTER — Other Ambulatory Visit: Payer: Self-pay | Admitting: Internal Medicine

## 2012-11-03 ENCOUNTER — Inpatient Hospital Stay (HOSPITAL_COMMUNITY): Admission: RE | Admit: 2012-11-03 | Payer: Medicare Other | Source: Ambulatory Visit

## 2012-11-09 ENCOUNTER — Other Ambulatory Visit (HOSPITAL_COMMUNITY): Payer: Self-pay | Admitting: *Deleted

## 2012-11-13 ENCOUNTER — Encounter (HOSPITAL_COMMUNITY)
Admission: RE | Admit: 2012-11-13 | Discharge: 2012-11-13 | Disposition: A | Discharge status: Home / Self Care | Payer: Medicare Other | Source: Ambulatory Visit | Attending: Internal Medicine | Admitting: Internal Medicine

## 2012-11-13 DIAGNOSIS — M810 Age-related osteoporosis without current pathological fracture: Secondary | ICD-10-CM | POA: Insufficient documentation

## 2012-11-13 MED ORDER — SODIUM CHLORIDE 0.9 % IV SOLN: INTRAVENOUS | Status: DC

## 2012-11-13 MED ORDER — IBANDRONATE SODIUM 3 MG/3ML IV SOLN: 3.0000 mg | Freq: Once | INTRAVENOUS | Status: DC

## 2012-11-13 MED ORDER — SODIUM CHLORIDE 0.9 % IV SOLN
INTRAVENOUS | Status: DC
  Administered 2012-11-13: 12:00:00 via INTRAVENOUS

## 2012-11-13 MED ORDER — IBANDRONATE SODIUM 3 MG/3ML IV SOLN
INTRAVENOUS | Status: AC
  Administered 2012-11-13: 3 mg via INTRAVENOUS
  Filled 2012-11-13: qty 3

## 2012-11-27 DIAGNOSIS — J019 Acute sinusitis, unspecified: Secondary | ICD-10-CM | POA: Diagnosis not present

## 2012-11-27 DIAGNOSIS — J309 Allergic rhinitis, unspecified: Secondary | ICD-10-CM | POA: Diagnosis not present

## 2013-01-19 DIAGNOSIS — M810 Age-related osteoporosis without current pathological fracture: Secondary | ICD-10-CM | POA: Diagnosis not present

## 2013-01-19 DIAGNOSIS — E7800 Pure hypercholesterolemia, unspecified: Secondary | ICD-10-CM | POA: Diagnosis not present

## 2013-01-22 DIAGNOSIS — L819 Disorder of pigmentation, unspecified: Secondary | ICD-10-CM | POA: Diagnosis not present

## 2013-01-22 DIAGNOSIS — Z85828 Personal history of other malignant neoplasm of skin: Secondary | ICD-10-CM | POA: Diagnosis not present

## 2013-01-22 DIAGNOSIS — L538 Other specified erythematous conditions: Secondary | ICD-10-CM | POA: Diagnosis not present

## 2013-01-23 DIAGNOSIS — G4700 Insomnia, unspecified: Secondary | ICD-10-CM | POA: Diagnosis not present

## 2013-01-23 DIAGNOSIS — F329 Major depressive disorder, single episode, unspecified: Secondary | ICD-10-CM | POA: Diagnosis not present

## 2013-01-23 DIAGNOSIS — M353 Polymyalgia rheumatica: Secondary | ICD-10-CM | POA: Diagnosis not present

## 2013-01-23 DIAGNOSIS — R21 Rash and other nonspecific skin eruption: Secondary | ICD-10-CM | POA: Diagnosis not present

## 2013-02-13 ENCOUNTER — Encounter (HOSPITAL_COMMUNITY)
Admission: RE | Admit: 2013-02-13 | Discharge: 2013-02-13 | Disposition: A | Discharge status: Home / Self Care | Payer: Medicare Other | Source: Ambulatory Visit | Attending: Internal Medicine | Admitting: Internal Medicine

## 2013-02-13 DIAGNOSIS — M810 Age-related osteoporosis without current pathological fracture: Secondary | ICD-10-CM | POA: Diagnosis not present

## 2013-02-13 MED ORDER — IBANDRONATE SODIUM 3 MG/3ML IV SOLN
INTRAVENOUS | Status: AC
  Filled 2013-02-13: qty 3

## 2013-02-13 MED ORDER — IBANDRONATE SODIUM 3 MG/3ML IV SOLN
3.0000 mg | Freq: Once | INTRAVENOUS | Status: AC
  Administered 2013-02-13: 3 mg via INTRAVENOUS

## 2013-02-13 MED ORDER — SODIUM CHLORIDE 0.9 % IV SOLN
INTRAVENOUS | Status: DC
  Administered 2013-02-13: 12:00:00 via INTRAVENOUS

## 2013-02-22 DIAGNOSIS — L538 Other specified erythematous conditions: Secondary | ICD-10-CM | POA: Diagnosis not present

## 2013-02-22 DIAGNOSIS — L219 Seborrheic dermatitis, unspecified: Secondary | ICD-10-CM | POA: Diagnosis not present

## 2013-02-22 DIAGNOSIS — Z85828 Personal history of other malignant neoplasm of skin: Secondary | ICD-10-CM | POA: Diagnosis not present

## 2013-02-22 DIAGNOSIS — L570 Actinic keratosis: Secondary | ICD-10-CM | POA: Diagnosis not present

## 2013-05-15 ENCOUNTER — Other Ambulatory Visit (HOSPITAL_COMMUNITY): Payer: Self-pay | Admitting: *Deleted

## 2013-05-16 ENCOUNTER — Ambulatory Visit (HOSPITAL_COMMUNITY)
Admission: RE | Admit: 2013-05-16 | Discharge: 2013-05-16 | Disposition: A | Discharge status: Home / Self Care | Payer: Medicare Other | Source: Ambulatory Visit | Attending: Internal Medicine | Admitting: Internal Medicine

## 2013-05-16 DIAGNOSIS — M810 Age-related osteoporosis without current pathological fracture: Secondary | ICD-10-CM | POA: Diagnosis not present

## 2013-05-16 MED ORDER — IBANDRONATE SODIUM 3 MG/3ML IV SOLN
INTRAVENOUS | Status: AC
  Administered 2013-05-16: 3 mg via INTRAVENOUS
  Filled 2013-05-16: qty 3

## 2013-05-16 MED ORDER — IBANDRONATE SODIUM 3 MG/3ML IV SOLN
3.0000 mg | Freq: Once | INTRAVENOUS | Status: AC
  Administered 2013-05-16: 3 mg via INTRAVENOUS

## 2013-05-22 ENCOUNTER — Ambulatory Visit: Payer: Medicare Other | Admitting: Gynecology

## 2013-05-24 ENCOUNTER — Ambulatory Visit (INDEPENDENT_AMBULATORY_CARE_PROVIDER_SITE_OTHER): Payer: Medicare Other | Admitting: Gynecology

## 2013-05-24 ENCOUNTER — Encounter: Payer: Self-pay | Admitting: Gynecology

## 2013-05-24 DIAGNOSIS — N898 Other specified noninflammatory disorders of vagina: Secondary | ICD-10-CM

## 2013-05-24 DIAGNOSIS — N952 Postmenopausal atrophic vaginitis: Secondary | ICD-10-CM

## 2013-05-24 DIAGNOSIS — R300 Dysuria: Secondary | ICD-10-CM

## 2013-05-24 DIAGNOSIS — L293 Anogenital pruritus, unspecified: Secondary | ICD-10-CM | POA: Diagnosis not present

## 2013-05-24 MED ORDER — MICONAZOLE-ZINC OXIDE-PETROLAT 0.25-15-81.35 % EX OINT: TOPICAL_OINTMENT | CUTANEOUS | Status: DC

## 2013-05-24 NOTE — Progress Notes (Signed)
Patient presents complaining of vulvar pruritus as well as very anal itching. Has a long history of vulvar itching treated with a variety of different medications. Most recently he had great success using Vusion cream per dermatology. Note since she has not been using this regularly. No vaginal discharge, odor, urinary frequency dysuria urgency suprapubic pain symptoms. No diarrhea or constipation.  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 Cervix with atrophic changes. Uterus small difficult to palpate. Adnexa without masses or tenderness.  Assessment and plan: Atrophic genital changes with vulvar/perianal pruritus. Exam is unremarkable other than atrophic changes. Recommend Vusion cream at bedtime times one to 2 weeks. Followup if symptoms persist or worsen.

## 2013-05-24 NOTE — Addendum Note (Signed)
Addended by: Nelva Nay on: 05/24/2013 04:27 PM   Modules accepted: Orders

## 2013-05-24 NOTE — Patient Instructions (Signed)
Apply Vusion cream at bedtime for 2 weeks. Followup if symptoms persist.

## 2013-05-25 LAB — URINALYSIS W MICROSCOPIC + REFLEX CULTURE
BILIRUBIN URINE: NEGATIVE
Glucose, UA: NEGATIVE mg/dL
HGB URINE DIPSTICK: NEGATIVE
Ketones, ur: NEGATIVE mg/dL
Leukocytes, UA: NEGATIVE
Nitrite: NEGATIVE
PROTEIN: NEGATIVE mg/dL
Specific Gravity, Urine: >1.03 — ABNORMAL HIGH (ref 1.005–1.030)
UROBILINOGEN UA: 0.2 mg/dL (ref 0.0–1.0)
pH: 5.5 (ref 5.0–8.0)

## 2013-05-30 DIAGNOSIS — J329 Chronic sinusitis, unspecified: Secondary | ICD-10-CM | POA: Diagnosis not present

## 2013-06-04 DIAGNOSIS — H4011X0 Primary open-angle glaucoma, stage unspecified: Secondary | ICD-10-CM | POA: Diagnosis not present

## 2013-06-04 DIAGNOSIS — H409 Unspecified glaucoma: Secondary | ICD-10-CM | POA: Diagnosis not present

## 2013-07-23 DIAGNOSIS — F329 Major depressive disorder, single episode, unspecified: Secondary | ICD-10-CM | POA: Diagnosis not present

## 2013-07-23 DIAGNOSIS — Z79899 Other long term (current) drug therapy: Secondary | ICD-10-CM | POA: Diagnosis not present

## 2013-07-23 DIAGNOSIS — F3289 Other specified depressive episodes: Secondary | ICD-10-CM | POA: Diagnosis not present

## 2013-07-23 DIAGNOSIS — R5381 Other malaise: Secondary | ICD-10-CM | POA: Diagnosis not present

## 2013-07-23 DIAGNOSIS — R5383 Other fatigue: Secondary | ICD-10-CM | POA: Diagnosis not present

## 2013-07-23 DIAGNOSIS — E7800 Pure hypercholesterolemia, unspecified: Secondary | ICD-10-CM | POA: Diagnosis not present

## 2013-07-26 DIAGNOSIS — F3289 Other specified depressive episodes: Secondary | ICD-10-CM | POA: Diagnosis not present

## 2013-07-26 DIAGNOSIS — F329 Major depressive disorder, single episode, unspecified: Secondary | ICD-10-CM | POA: Diagnosis not present

## 2013-07-26 DIAGNOSIS — G4700 Insomnia, unspecified: Secondary | ICD-10-CM | POA: Diagnosis not present

## 2013-07-26 DIAGNOSIS — K2270 Barrett's esophagus without dysplasia: Secondary | ICD-10-CM | POA: Diagnosis not present

## 2013-07-26 DIAGNOSIS — K219 Gastro-esophageal reflux disease without esophagitis: Secondary | ICD-10-CM | POA: Diagnosis not present

## 2013-08-02 ENCOUNTER — Encounter: Payer: Medicare Other | Admitting: Gynecology

## 2013-08-13 ENCOUNTER — Other Ambulatory Visit: Payer: Self-pay | Admitting: Internal Medicine

## 2013-08-13 MED ORDER — IBANDRONATE SODIUM 3 MG/3ML IV SOLN: 3.0000 mg | Freq: Once | INTRAVENOUS | Status: DC

## 2013-08-14 ENCOUNTER — Encounter (HOSPITAL_COMMUNITY)
Admission: RE | Admit: 2013-08-14 | Discharge: 2013-08-14 | Disposition: A | Discharge status: Home / Self Care | Payer: Medicare Other | Source: Ambulatory Visit | Attending: Internal Medicine | Admitting: Internal Medicine

## 2013-08-14 DIAGNOSIS — X58XXXA Exposure to other specified factors, initial encounter: Secondary | ICD-10-CM | POA: Insufficient documentation

## 2013-08-14 DIAGNOSIS — S32009A Unspecified fracture of unspecified lumbar vertebra, initial encounter for closed fracture: Secondary | ICD-10-CM | POA: Insufficient documentation

## 2013-08-14 DIAGNOSIS — M353 Polymyalgia rheumatica: Secondary | ICD-10-CM | POA: Diagnosis not present

## 2013-08-14 DIAGNOSIS — Z5181 Encounter for therapeutic drug level monitoring: Secondary | ICD-10-CM | POA: Insufficient documentation

## 2013-08-14 MED ORDER — IBANDRONATE SODIUM 3 MG/3ML IV SOLN
INTRAVENOUS | Status: AC
  Filled 2013-08-14: qty 3

## 2013-08-14 MED ORDER — IBANDRONATE SODIUM 3 MG/3ML IV SOLN
3.0000 mg | Freq: Once | INTRAVENOUS | Status: AC
  Administered 2013-08-14: 3 mg via INTRAVENOUS

## 2013-08-23 ENCOUNTER — Encounter: Payer: Medicare Other | Admitting: Gynecology

## 2013-09-12 DIAGNOSIS — J309 Allergic rhinitis, unspecified: Secondary | ICD-10-CM | POA: Diagnosis not present

## 2013-09-12 DIAGNOSIS — J019 Acute sinusitis, unspecified: Secondary | ICD-10-CM | POA: Diagnosis not present

## 2013-10-09 DIAGNOSIS — M949 Disorder of cartilage, unspecified: Secondary | ICD-10-CM | POA: Diagnosis not present

## 2013-10-09 DIAGNOSIS — M899 Disorder of bone, unspecified: Secondary | ICD-10-CM | POA: Diagnosis not present

## 2013-10-24 DIAGNOSIS — J329 Chronic sinusitis, unspecified: Secondary | ICD-10-CM | POA: Diagnosis not present

## 2013-10-24 DIAGNOSIS — J309 Allergic rhinitis, unspecified: Secondary | ICD-10-CM | POA: Diagnosis not present

## 2013-11-08 DIAGNOSIS — R51 Headache: Secondary | ICD-10-CM | POA: Diagnosis not present

## 2013-11-08 DIAGNOSIS — R42 Dizziness and giddiness: Secondary | ICD-10-CM | POA: Diagnosis not present

## 2013-11-22 DIAGNOSIS — M549 Dorsalgia, unspecified: Secondary | ICD-10-CM | POA: Diagnosis not present

## 2013-11-22 DIAGNOSIS — E7800 Pure hypercholesterolemia, unspecified: Secondary | ICD-10-CM | POA: Diagnosis not present

## 2013-11-22 DIAGNOSIS — R51 Headache: Secondary | ICD-10-CM | POA: Diagnosis not present

## 2013-11-22 DIAGNOSIS — M810 Age-related osteoporosis without current pathological fracture: Secondary | ICD-10-CM | POA: Diagnosis not present

## 2013-12-13 DIAGNOSIS — M542 Cervicalgia: Secondary | ICD-10-CM | POA: Diagnosis not present

## 2013-12-20 DIAGNOSIS — H4011X0 Primary open-angle glaucoma, stage unspecified: Secondary | ICD-10-CM | POA: Diagnosis not present

## 2013-12-20 DIAGNOSIS — Z961 Presence of intraocular lens: Secondary | ICD-10-CM | POA: Diagnosis not present

## 2013-12-20 DIAGNOSIS — H409 Unspecified glaucoma: Secondary | ICD-10-CM | POA: Diagnosis not present

## 2014-01-31 DIAGNOSIS — Z85828 Personal history of other malignant neoplasm of skin: Secondary | ICD-10-CM | POA: Diagnosis not present

## 2014-01-31 DIAGNOSIS — L821 Other seborrheic keratosis: Secondary | ICD-10-CM | POA: Diagnosis not present

## 2014-01-31 DIAGNOSIS — L218 Other seborrheic dermatitis: Secondary | ICD-10-CM | POA: Diagnosis not present

## 2014-02-20 DIAGNOSIS — M810 Age-related osteoporosis without current pathological fracture: Secondary | ICD-10-CM | POA: Diagnosis not present

## 2014-02-20 DIAGNOSIS — E780 Pure hypercholesterolemia: Secondary | ICD-10-CM | POA: Diagnosis not present

## 2014-02-25 DIAGNOSIS — R05 Cough: Secondary | ICD-10-CM | POA: Diagnosis not present

## 2014-02-25 DIAGNOSIS — M542 Cervicalgia: Secondary | ICD-10-CM | POA: Diagnosis not present

## 2014-02-25 DIAGNOSIS — M47812 Spondylosis without myelopathy or radiculopathy, cervical region: Secondary | ICD-10-CM | POA: Diagnosis not present

## 2014-02-25 DIAGNOSIS — J301 Allergic rhinitis due to pollen: Secondary | ICD-10-CM | POA: Diagnosis not present

## 2014-03-06 DIAGNOSIS — J301 Allergic rhinitis due to pollen: Secondary | ICD-10-CM | POA: Diagnosis not present

## 2014-03-11 ENCOUNTER — Encounter: Payer: Self-pay | Admitting: Gynecology

## 2014-03-25 DIAGNOSIS — M810 Age-related osteoporosis without current pathological fracture: Secondary | ICD-10-CM | POA: Diagnosis not present

## 2014-03-25 DIAGNOSIS — E780 Pure hypercholesterolemia: Secondary | ICD-10-CM | POA: Diagnosis not present

## 2014-03-25 DIAGNOSIS — R490 Dysphonia: Secondary | ICD-10-CM | POA: Diagnosis not present

## 2014-03-25 DIAGNOSIS — K219 Gastro-esophageal reflux disease without esophagitis: Secondary | ICD-10-CM | POA: Diagnosis not present

## 2014-04-11 DIAGNOSIS — J309 Allergic rhinitis, unspecified: Secondary | ICD-10-CM | POA: Diagnosis not present

## 2014-04-11 DIAGNOSIS — G44029 Chronic cluster headache, not intractable: Secondary | ICD-10-CM | POA: Diagnosis not present

## 2014-04-11 DIAGNOSIS — K219 Gastro-esophageal reflux disease without esophagitis: Secondary | ICD-10-CM | POA: Diagnosis not present

## 2014-04-11 DIAGNOSIS — R490 Dysphonia: Secondary | ICD-10-CM | POA: Diagnosis not present

## 2014-04-15 DIAGNOSIS — Z23 Encounter for immunization: Secondary | ICD-10-CM | POA: Diagnosis not present

## 2014-04-17 DIAGNOSIS — G44029 Chronic cluster headache, not intractable: Secondary | ICD-10-CM | POA: Diagnosis not present

## 2014-06-07 DIAGNOSIS — J324 Chronic pansinusitis: Secondary | ICD-10-CM | POA: Diagnosis not present

## 2014-06-13 ENCOUNTER — Encounter: Payer: Self-pay | Admitting: Gynecology

## 2014-06-13 ENCOUNTER — Ambulatory Visit (INDEPENDENT_AMBULATORY_CARE_PROVIDER_SITE_OTHER): Payer: Medicare Other | Admitting: Gynecology

## 2014-06-13 DIAGNOSIS — N952 Postmenopausal atrophic vaginitis: Secondary | ICD-10-CM | POA: Diagnosis not present

## 2014-06-13 DIAGNOSIS — B373 Candidiasis of vulva and vagina: Secondary | ICD-10-CM

## 2014-06-13 DIAGNOSIS — B3731 Acute candidiasis of vulva and vagina: Secondary | ICD-10-CM

## 2014-06-13 LAB — WET PREP FOR TRICH, YEAST, CLUE
CLUE CELLS WET PREP: NONE SEEN
Trich, Wet Prep: NONE SEEN

## 2014-06-13 MED ORDER — FLUCONAZOLE 150 MG PO TABS: 150.0000 mg | ORAL_TABLET | Freq: Once | ORAL | Status: DC

## 2014-06-13 NOTE — Progress Notes (Signed)
Katrina Burnett Dec 19, 1924 [id number]        80 y.o.  [id number] Presents with vaginal discharge and irritation. Patient has been on an oral antibiotic for one month due to sinusitis type symptoms. Notes the vaginal irritation is been present for the past week or 2. No fever chills nausea vomiting diarrhea constipation. No vaginal odor. No urinary symptoms such as frequency dysuria urgency or low back pain.  Past medical history,surgical history, problem list, medications, allergies, family history and social history were all reviewed and documented in the EPIC chart.  Directed ROS with pertinent positives and negatives documented in the history of present illness/assessment and plan.  Exam: Kim assistant General appearance:  Normal Abdomen soft nontender without masses guarding rebound Pelvic external BUS vagina with atrophic changes. Slight white discharge noted. Cervix atrophic. Uterus normal size, midline mobile nontender. Adnexa without masses or tenderness  Assessment/Plan:  81 y.o. [id number] with history, exam and wet prep consistent with yeast vaginitis. Will cover with Diflucan 150 mg daily for 2 doses. 2 extra pills given in the event she has a recurrence to take as needed as she is planning to continue the oral antibiotics for now.  Follow up if her symptoms persist, worsen or recur despite treatment.     Anastasio Auerbach MD, 3:53 PM 06/13/2014

## 2014-06-13 NOTE — Patient Instructions (Signed)
Take the Diflucan pill daily for 2 days in a row. Save the other 2 pills if you have a recurrence of the irritation or discharge. Call if your symptoms persist or worsen despite treatment.

## 2014-06-13 NOTE — Addendum Note (Signed)
Addended by: Nelva Nay on: 06/13/2014 04:00 PM   Modules accepted: Orders

## 2014-07-16 DIAGNOSIS — H04123 Dry eye syndrome of bilateral lacrimal glands: Secondary | ICD-10-CM | POA: Diagnosis not present

## 2014-07-16 DIAGNOSIS — H01002 Unspecified blepharitis right lower eyelid: Secondary | ICD-10-CM | POA: Diagnosis not present

## 2014-07-16 DIAGNOSIS — H01005 Unspecified blepharitis left lower eyelid: Secondary | ICD-10-CM | POA: Diagnosis not present

## 2014-07-18 ENCOUNTER — Other Ambulatory Visit: Payer: Self-pay | Admitting: Gynecology

## 2014-08-09 DIAGNOSIS — J300 Vasomotor rhinitis: Secondary | ICD-10-CM | POA: Diagnosis not present

## 2014-08-09 DIAGNOSIS — J329 Chronic sinusitis, unspecified: Secondary | ICD-10-CM | POA: Diagnosis not present

## 2014-08-21 DIAGNOSIS — H6121 Impacted cerumen, right ear: Secondary | ICD-10-CM | POA: Diagnosis not present

## 2014-08-21 DIAGNOSIS — J320 Chronic maxillary sinusitis: Secondary | ICD-10-CM | POA: Diagnosis not present

## 2014-08-21 DIAGNOSIS — J300 Vasomotor rhinitis: Secondary | ICD-10-CM | POA: Diagnosis not present

## 2014-08-21 DIAGNOSIS — J310 Chronic rhinitis: Secondary | ICD-10-CM | POA: Diagnosis not present

## 2014-09-02 DIAGNOSIS — R490 Dysphonia: Secondary | ICD-10-CM | POA: Diagnosis not present

## 2014-09-02 DIAGNOSIS — J324 Chronic pansinusitis: Secondary | ICD-10-CM | POA: Diagnosis not present

## 2014-09-02 DIAGNOSIS — J309 Allergic rhinitis, unspecified: Secondary | ICD-10-CM | POA: Diagnosis not present

## 2014-09-25 DIAGNOSIS — M810 Age-related osteoporosis without current pathological fracture: Secondary | ICD-10-CM | POA: Diagnosis not present

## 2014-09-25 DIAGNOSIS — Z0000 Encounter for general adult medical examination without abnormal findings: Secondary | ICD-10-CM | POA: Diagnosis not present

## 2014-09-25 DIAGNOSIS — E780 Pure hypercholesterolemia: Secondary | ICD-10-CM | POA: Diagnosis not present

## 2014-09-30 DIAGNOSIS — K219 Gastro-esophageal reflux disease without esophagitis: Secondary | ICD-10-CM | POA: Diagnosis not present

## 2014-09-30 DIAGNOSIS — M810 Age-related osteoporosis without current pathological fracture: Secondary | ICD-10-CM | POA: Diagnosis not present

## 2014-09-30 DIAGNOSIS — E780 Pure hypercholesterolemia: Secondary | ICD-10-CM | POA: Diagnosis not present

## 2014-09-30 DIAGNOSIS — F329 Major depressive disorder, single episode, unspecified: Secondary | ICD-10-CM | POA: Diagnosis not present

## 2014-10-21 DIAGNOSIS — J324 Chronic pansinusitis: Secondary | ICD-10-CM | POA: Diagnosis not present

## 2014-11-15 DIAGNOSIS — I959 Hypotension, unspecified: Secondary | ICD-10-CM | POA: Diagnosis not present

## 2014-11-15 DIAGNOSIS — H409 Unspecified glaucoma: Secondary | ICD-10-CM | POA: Diagnosis not present

## 2014-11-25 DIAGNOSIS — H4011X1 Primary open-angle glaucoma, mild stage: Secondary | ICD-10-CM | POA: Diagnosis not present

## 2015-01-06 DIAGNOSIS — H4011X1 Primary open-angle glaucoma, mild stage: Secondary | ICD-10-CM | POA: Diagnosis not present

## 2015-01-24 DIAGNOSIS — E780 Pure hypercholesterolemia: Secondary | ICD-10-CM | POA: Diagnosis not present

## 2015-01-24 DIAGNOSIS — E559 Vitamin D deficiency, unspecified: Secondary | ICD-10-CM | POA: Diagnosis not present

## 2015-01-24 DIAGNOSIS — M810 Age-related osteoporosis without current pathological fracture: Secondary | ICD-10-CM | POA: Diagnosis not present

## 2015-02-06 DIAGNOSIS — M791 Myalgia: Secondary | ICD-10-CM | POA: Diagnosis not present

## 2015-02-06 DIAGNOSIS — K219 Gastro-esophageal reflux disease without esophagitis: Secondary | ICD-10-CM | POA: Diagnosis not present

## 2015-02-06 DIAGNOSIS — Z23 Encounter for immunization: Secondary | ICD-10-CM | POA: Diagnosis not present

## 2015-02-11 DIAGNOSIS — Z85828 Personal history of other malignant neoplasm of skin: Secondary | ICD-10-CM | POA: Diagnosis not present

## 2015-02-11 DIAGNOSIS — L570 Actinic keratosis: Secondary | ICD-10-CM | POA: Diagnosis not present

## 2015-02-11 DIAGNOSIS — L298 Other pruritus: Secondary | ICD-10-CM | POA: Diagnosis not present

## 2015-02-11 DIAGNOSIS — L720 Epidermal cyst: Secondary | ICD-10-CM | POA: Diagnosis not present

## 2015-02-11 DIAGNOSIS — L821 Other seborrheic keratosis: Secondary | ICD-10-CM | POA: Diagnosis not present

## 2015-03-03 ENCOUNTER — Ambulatory Visit: Payer: PRIVATE HEALTH INSURANCE | Admitting: Gastroenterology

## 2015-03-27 ENCOUNTER — Ambulatory Visit (INDEPENDENT_AMBULATORY_CARE_PROVIDER_SITE_OTHER): Payer: Medicare Other | Admitting: Gynecology

## 2015-03-27 ENCOUNTER — Encounter: Payer: Self-pay | Admitting: Gynecology

## 2015-03-27 VITALS — BP 110/70

## 2015-03-27 DIAGNOSIS — N898 Other specified noninflammatory disorders of vagina: Secondary | ICD-10-CM | POA: Diagnosis not present

## 2015-03-27 DIAGNOSIS — N899 Noninflammatory disorder of vagina, unspecified: Secondary | ICD-10-CM | POA: Diagnosis not present

## 2015-03-27 LAB — URINALYSIS W MICROSCOPIC + REFLEX CULTURE
Bacteria, UA: NONE SEEN [HPF]
Bilirubin Urine: NEGATIVE
CASTS: NONE SEEN [LPF]
CRYSTALS: NONE SEEN [HPF]
Glucose, UA: NEGATIVE
Ketones, ur: NEGATIVE
Leukocytes, UA: NEGATIVE
NITRITE: NEGATIVE
PH: 5.5 (ref 5.0–8.0)
Protein, ur: NEGATIVE
RBC / HPF: NONE SEEN RBC/HPF (ref <=2)
WBC, UA: NONE SEEN WBC/HPF (ref <=5)
YEAST: NONE SEEN [HPF]

## 2015-03-27 LAB — WET PREP FOR TRICH, YEAST, CLUE
CLUE CELLS WET PREP: NONE SEEN
TRICH WET PREP: NONE SEEN

## 2015-03-27 MED ORDER — FLUCONAZOLE 150 MG PO TABS: 150.0000 mg | ORAL_TABLET | Freq: Once | ORAL | Status: DC

## 2015-03-27 NOTE — Progress Notes (Addendum)
Katrina Burnett [birth year] [id number]        81 y.o.  [id number] Presents with several days of vaginal itching. No significant discharge. No odor. Also notes a little bit of dysuria but no frequency urgency low back pain fever or chills.  Past medical history,surgical history, problem list, medications, allergies, family history and social history were all reviewed and documented in the EPIC chart.  Directed ROS with pertinent positives and negatives documented in the history of present illness/assessment and plan.  Exam: Kim assistant Filed Vitals:   03/27/15 1042  BP: 110/70   General appearance:  Normal Spine straight without CVA tenderness Abdomen soft nontender without masses guarding rebound Pelvic external BUS vagina with atrophic changes. Scant discharge noted.  Assessment/Plan:  81 y.o. [id number] with symptoms and wet prep consistent with yeast vaginitis. Will treat with Diflucan 150 mg 1 dose. I did give her 5 pills total to have for the upcoming year to use when necessary as she does seem to have some recurrences. Urinalysis is negative. I suspect her dysuria secondary to the vaginal irritation and will clear with the Diflucan treatment.   Follow up if symptoms persist, worsen or recur.    Katrina Auerbach MD, 11:19 AM 03/27/2015

## 2015-03-27 NOTE — Patient Instructions (Signed)
Take the Diflucan pill as needed for vaginal itching or irritation.

## 2015-04-02 DIAGNOSIS — F419 Anxiety disorder, unspecified: Secondary | ICD-10-CM | POA: Diagnosis not present

## 2015-04-02 DIAGNOSIS — M549 Dorsalgia, unspecified: Secondary | ICD-10-CM | POA: Diagnosis not present

## 2015-04-02 DIAGNOSIS — E7800 Pure hypercholesterolemia, unspecified: Secondary | ICD-10-CM | POA: Diagnosis not present

## 2015-04-02 DIAGNOSIS — K219 Gastro-esophageal reflux disease without esophagitis: Secondary | ICD-10-CM | POA: Diagnosis not present

## 2015-05-22 ENCOUNTER — Inpatient Hospital Stay (HOSPITAL_COMMUNITY): Payer: Medicare Other

## 2015-05-22 ENCOUNTER — Emergency Department (HOSPITAL_COMMUNITY): Payer: Medicare Other

## 2015-05-22 ENCOUNTER — Inpatient Hospital Stay (HOSPITAL_COMMUNITY)
Admission: EM | Admit: 2015-05-22 | Discharge: 2015-05-26 | DRG: 481 | Disposition: A | Discharge status: Rehabilitation Facility | Payer: Medicare Other | Attending: Internal Medicine | Admitting: Internal Medicine

## 2015-05-22 ENCOUNTER — Encounter (HOSPITAL_COMMUNITY): Payer: Self-pay | Admitting: Emergency Medicine

## 2015-05-22 DIAGNOSIS — F4323 Adjustment disorder with mixed anxiety and depressed mood: Secondary | ICD-10-CM | POA: Diagnosis not present

## 2015-05-22 DIAGNOSIS — M810 Age-related osteoporosis without current pathological fracture: Secondary | ICD-10-CM | POA: Diagnosis present

## 2015-05-22 DIAGNOSIS — K219 Gastro-esophageal reflux disease without esophagitis: Secondary | ICD-10-CM | POA: Diagnosis not present

## 2015-05-22 DIAGNOSIS — H409 Unspecified glaucoma: Secondary | ICD-10-CM | POA: Diagnosis present

## 2015-05-22 DIAGNOSIS — D62 Acute posthemorrhagic anemia: Secondary | ICD-10-CM | POA: Insufficient documentation

## 2015-05-22 DIAGNOSIS — S72001A Fracture of unspecified part of neck of right femur, initial encounter for closed fracture: Secondary | ICD-10-CM | POA: Diagnosis present

## 2015-05-22 DIAGNOSIS — R269 Unspecified abnormalities of gait and mobility: Secondary | ICD-10-CM | POA: Diagnosis not present

## 2015-05-22 DIAGNOSIS — Z01818 Encounter for other preprocedural examination: Secondary | ICD-10-CM | POA: Diagnosis not present

## 2015-05-22 DIAGNOSIS — Z419 Encounter for procedure for purposes other than remedying health state, unspecified: Secondary | ICD-10-CM

## 2015-05-22 DIAGNOSIS — M353 Polymyalgia rheumatica: Secondary | ICD-10-CM | POA: Diagnosis present

## 2015-05-22 DIAGNOSIS — S7290XA Unspecified fracture of unspecified femur, initial encounter for closed fracture: Secondary | ICD-10-CM | POA: Insufficient documentation

## 2015-05-22 DIAGNOSIS — G8918 Other acute postprocedural pain: Secondary | ICD-10-CM | POA: Diagnosis not present

## 2015-05-22 DIAGNOSIS — W1839XA Other fall on same level, initial encounter: Secondary | ICD-10-CM | POA: Diagnosis present

## 2015-05-22 DIAGNOSIS — S72141A Displaced intertrochanteric fracture of right femur, initial encounter for closed fracture: Secondary | ICD-10-CM | POA: Diagnosis not present

## 2015-05-22 DIAGNOSIS — S72001D Fracture of unspecified part of neck of right femur, subsequent encounter for closed fracture with routine healing: Secondary | ICD-10-CM | POA: Diagnosis not present

## 2015-05-22 DIAGNOSIS — K5900 Constipation, unspecified: Secondary | ICD-10-CM | POA: Diagnosis present

## 2015-05-22 DIAGNOSIS — Y93K1 Activity, walking an animal: Secondary | ICD-10-CM | POA: Diagnosis not present

## 2015-05-22 DIAGNOSIS — Y92410 Unspecified street and highway as the place of occurrence of the external cause: Secondary | ICD-10-CM

## 2015-05-22 DIAGNOSIS — Z8249 Family history of ischemic heart disease and other diseases of the circulatory system: Secondary | ICD-10-CM

## 2015-05-22 DIAGNOSIS — T148 Other injury of unspecified body region: Secondary | ICD-10-CM | POA: Diagnosis not present

## 2015-05-22 DIAGNOSIS — K2270 Barrett's esophagus without dysplasia: Secondary | ICD-10-CM | POA: Diagnosis present

## 2015-05-22 DIAGNOSIS — Z800 Family history of malignant neoplasm of digestive organs: Secondary | ICD-10-CM

## 2015-05-22 DIAGNOSIS — W19XXXA Unspecified fall, initial encounter: Secondary | ICD-10-CM | POA: Insufficient documentation

## 2015-05-22 DIAGNOSIS — S72009D Fracture of unspecified part of neck of unspecified femur, subsequent encounter for closed fracture with routine healing: Secondary | ICD-10-CM | POA: Diagnosis not present

## 2015-05-22 DIAGNOSIS — F411 Generalized anxiety disorder: Secondary | ICD-10-CM | POA: Diagnosis not present

## 2015-05-22 DIAGNOSIS — M25551 Pain in right hip: Secondary | ICD-10-CM | POA: Diagnosis not present

## 2015-05-22 DIAGNOSIS — W19XXXS Unspecified fall, sequela: Secondary | ICD-10-CM | POA: Diagnosis not present

## 2015-05-22 DIAGNOSIS — M25559 Pain in unspecified hip: Secondary | ICD-10-CM | POA: Diagnosis not present

## 2015-05-22 DIAGNOSIS — S72141S Displaced intertrochanteric fracture of right femur, sequela: Secondary | ICD-10-CM | POA: Diagnosis not present

## 2015-05-22 DIAGNOSIS — S7291XA Unspecified fracture of right femur, initial encounter for closed fracture: Secondary | ICD-10-CM

## 2015-05-22 DIAGNOSIS — R609 Edema, unspecified: Secondary | ICD-10-CM | POA: Diagnosis not present

## 2015-05-22 DIAGNOSIS — S72009A Fracture of unspecified part of neck of unspecified femur, initial encounter for closed fracture: Secondary | ICD-10-CM | POA: Diagnosis not present

## 2015-05-22 LAB — CBC WITH DIFFERENTIAL/PLATELET
Basophils Absolute: 0 10*3/uL (ref 0.0–0.1)
Basophils Relative: 0 %
Eosinophils Absolute: 0.3 10*3/uL (ref 0.0–0.7)
Eosinophils Relative: 3 %
HCT: 39.2 % (ref 36.0–46.0)
Hemoglobin: 12.4 g/dL (ref 12.0–15.0)
Lymphocytes Relative: 12 %
Lymphs Abs: 1.1 10*3/uL (ref 0.7–4.0)
MCH: 29.1 pg (ref 26.0–34.0)
MCHC: 31.6 g/dL (ref 30.0–36.0)
MCV: 92 fL (ref 78.0–100.0)
Monocytes Absolute: 0.6 10*3/uL (ref 0.1–1.0)
Monocytes Relative: 6 %
Neutro Abs: 7.7 10*3/uL (ref 1.7–7.7)
Neutrophils Relative %: 79 %
Platelets: 292 10*3/uL (ref 150–400)
RBC: 4.26 MIL/uL (ref 3.87–5.11)
RDW: 13 % (ref 11.5–15.5)
WBC: 9.8 10*3/uL (ref 4.0–10.5)

## 2015-05-22 LAB — BASIC METABOLIC PANEL
Anion gap: 11 (ref 5–15)
BUN: 23 mg/dL — ABNORMAL HIGH (ref 6–20)
CO2: 21 mmol/L — ABNORMAL LOW (ref 22–32)
Calcium: 9.2 mg/dL (ref 8.9–10.3)
Chloride: 104 mmol/L (ref 101–111)
Creatinine, Ser: 0.85 mg/dL (ref 0.44–1.00)
GFR calc Af Amer: >60 mL/min (ref >60)
GFR calc non Af Amer: 59 mL/min — ABNORMAL LOW (ref >60)
Glucose, Bld: 87 mg/dL (ref 65–99)
Potassium: 4.3 mmol/L (ref 3.5–5.1)
Sodium: 136 mmol/L (ref 135–145)

## 2015-05-22 MED ORDER — DEXTROSE 5 % IV SOLN
500.0000 mg | Freq: Four times a day (QID) | INTRAVENOUS | Status: DC | PRN
  Administered 2015-05-23: 500 mg via INTRAVENOUS
  Filled 2015-05-22: qty 5

## 2015-05-22 MED ORDER — ENOXAPARIN SODIUM 40 MG/0.4ML ~~LOC~~ SOLN
40.0000 mg | SUBCUTANEOUS | Status: DC
  Administered 2015-05-23 – 2015-05-25 (×4): 40 mg via SUBCUTANEOUS
  Filled 2015-05-22 (×5): qty 0.4

## 2015-05-22 MED ORDER — MORPHINE SULFATE (PF) 4 MG/ML IV SOLN
4.0000 mg | Freq: Once | INTRAVENOUS | Status: AC
  Administered 2015-05-22: 4 mg via INTRAVENOUS
  Filled 2015-05-22: qty 1

## 2015-05-22 MED ORDER — LORAZEPAM 1 MG PO TABS
1.0000 mg | ORAL_TABLET | Freq: Two times a day (BID) | ORAL | Status: DC
  Administered 2015-05-22 – 2015-05-26 (×7): 1 mg via ORAL
  Filled 2015-05-22 (×7): qty 1

## 2015-05-22 MED ORDER — ONDANSETRON HCL 4 MG/2ML IJ SOLN
4.0000 mg | Freq: Four times a day (QID) | INTRAMUSCULAR | Status: DC | PRN
  Administered 2015-05-22: 4 mg via INTRAVENOUS
  Filled 2015-05-22: qty 2

## 2015-05-22 MED ORDER — RISAQUAD PO CAPS
1.0000 | ORAL_CAPSULE | Freq: Every day | ORAL | Status: DC
  Filled 2015-05-22: qty 1

## 2015-05-22 MED ORDER — ALIGN 4 MG PO CAPS: 4.0000 mg | ORAL_CAPSULE | Freq: Every day | ORAL | Status: DC

## 2015-05-22 MED ORDER — ADULT MULTIVITAMIN W/MINERALS CH
1.0000 | ORAL_TABLET | Freq: Every day | ORAL | Status: DC
  Filled 2015-05-22: qty 1

## 2015-05-22 MED ORDER — HYDROCODONE-ACETAMINOPHEN 5-325 MG PO TABS
1.0000 | ORAL_TABLET | Freq: Four times a day (QID) | ORAL | Status: DC | PRN
  Administered 2015-05-22 – 2015-05-24 (×3): 2 via ORAL
  Administered 2015-05-24: 1 via ORAL
  Administered 2015-05-24 – 2015-05-26 (×5): 2 via ORAL
  Filled 2015-05-22 (×2): qty 2
  Filled 2015-05-22: qty 1
  Filled 2015-05-22 (×7): qty 2

## 2015-05-22 MED ORDER — MORPHINE SULFATE (PF) 2 MG/ML IV SOLN
0.5000 mg | INTRAVENOUS | Status: DC | PRN
  Administered 2015-05-23 (×4): 0.5 mg via INTRAVENOUS
  Filled 2015-05-22 (×4): qty 1

## 2015-05-22 MED ORDER — LATANOPROST 0.005 % OP SOLN
1.0000 [drp] | Freq: Every day | OPHTHALMIC | Status: DC
  Administered 2015-05-23 – 2015-05-25 (×4): 1 [drp] via OPHTHALMIC
  Filled 2015-05-22: qty 2.5

## 2015-05-22 MED ORDER — LORATADINE 10 MG PO TABS
10.0000 mg | ORAL_TABLET | Freq: Every day | ORAL | Status: DC
  Administered 2015-05-24 – 2015-05-26 (×3): 10 mg via ORAL
  Filled 2015-05-22 (×3): qty 1

## 2015-05-22 MED ORDER — FLUTICASONE PROPIONATE 50 MCG/ACT NA SUSP
1.0000 | Freq: Every day | NASAL | Status: DC
  Administered 2015-05-24 – 2015-05-26 (×3): 1 via NASAL
  Filled 2015-05-22: qty 16

## 2015-05-22 MED ORDER — SODIUM CHLORIDE 0.9 % IV SOLN
INTRAVENOUS | Status: DC
  Administered 2015-05-22: 23:00:00 via INTRAVENOUS

## 2015-05-22 MED ORDER — PANTOPRAZOLE SODIUM 40 MG PO TBEC
40.0000 mg | DELAYED_RELEASE_TABLET | Freq: Every day | ORAL | Status: DC
  Administered 2015-05-22 – 2015-05-26 (×4): 40 mg via ORAL
  Filled 2015-05-22 (×4): qty 1

## 2015-05-22 MED ORDER — VITAMIN D 1000 UNITS PO TABS
1000.0000 [IU] | ORAL_TABLET | Freq: Every day | ORAL | Status: DC
Start: 2015-05-23 — End: 2015-05-23

## 2015-05-22 MED ORDER — FAMOTIDINE 20 MG PO TABS
10.0000 mg | ORAL_TABLET | Freq: Two times a day (BID) | ORAL | Status: DC
  Administered 2015-05-22 – 2015-05-26 (×7): 10 mg via ORAL
  Filled 2015-05-22 (×7): qty 1

## 2015-05-22 MED ORDER — DORZOLAMIDE HCL 2 % OP SOLN
1.0000 [drp] | Freq: Two times a day (BID) | OPHTHALMIC | Status: DC
  Administered 2015-05-23 – 2015-05-26 (×7): 1 [drp] via OPHTHALMIC
  Filled 2015-05-22: qty 10

## 2015-05-22 MED ORDER — CITALOPRAM HYDROBROMIDE 20 MG PO TABS
20.0000 mg | ORAL_TABLET | Freq: Every day | ORAL | Status: DC
  Administered 2015-05-24 – 2015-05-26 (×3): 20 mg via ORAL
  Filled 2015-05-22 (×3): qty 1

## 2015-05-22 MED ORDER — BACITRACIN ZINC 500 UNIT/GM EX OINT
TOPICAL_OINTMENT | CUTANEOUS | Status: AC
  Administered 2015-05-22: 1
  Filled 2015-05-22: qty 0.9

## 2015-05-22 MED ORDER — AZELASTINE HCL 0.1 % NA SOLN
1.0000 | Freq: Every day | NASAL | Status: DC
  Administered 2015-05-24 – 2015-05-25 (×2): 1 via NASAL
  Filled 2015-05-22: qty 30

## 2015-05-22 MED ORDER — SENNOSIDES-DOCUSATE SODIUM 8.6-50 MG PO TABS
1.0000 | ORAL_TABLET | Freq: Every evening | ORAL | Status: DC | PRN
  Administered 2015-05-25: 1 via ORAL
  Filled 2015-05-22: qty 1

## 2015-05-22 NOTE — H&P (Addendum)
[facility] History and Physical  Katrina Burnett [id number] DOB: [birth year] DOA: 05/22/2015  Referring physician: Gerald Stabs lawyer , PA  PCP: Jani Gravel, MD   Chief Complaint:  Fall with right hip pain   HPI:  82 year old female with history of lumbar compression fracture, GERD with Barrett's esophagitis, osteoporosis who presented to the ED after sustaining a fall while she was walking her dog and her neighbors dog jumped on her. Patient lost her balance and fell on her right side. She had severe pain in her right hip. Patient sustained a small laceration in her right arm but denies sustaining any other injury, loss of consciousness or any head injury. Patient denies headache, dizziness, fever, chills, nausea , vomiting, chest pain, palpitations, SOB, abdominal pain, bowel or urinary symptoms. Denies change in weight or appetite. At baseline she is active and ambulatory.  In the ED,  patient's vitals were stable. Blood work done was unremarkable. Extremities the hip foreshortened femoral neck with slight rotation of the femoral head. CT of the hip was done to further confirm and showed fracture at the base of the cervical neck of the right femur along with involvement of the intertrochanteric area. Orthopedics Dr. Mayer Camel  was consulted who recommended admission to hospitalist service and transferred to [place] on surgery tomorrow morning around 11 AM.   Review of Systems:  Constitutional: Denies fever, chills, diaphoresis, appetite change and fatigue.  HEENT: Denies visual or hearing symptoms, congestion, sore throat, difficulty swallowing, neck pain or stiffness   Respiratory: Denies SOB, DOE, cough, chest tightness,  and wheezing.   Cardiovascular: Denies chest pain, palpitations and leg swelling.  Gastrointestinal: Denies nausea, vomiting, abdominal pain, diarrhea, constipation, blood in stool and abdominal distention.  Genitourinary: Denies dysuria, hematuria, flank pain,  difficulty urinating Endocrine: Denies  polyuria, polydipsia. Musculoskeletal: Has chronic back pain, pain on minimal movement of right hip,  Denies myalgias,  joint swelling, arthralgias and gait problem.  Skin: Denies pallor, rash and wound.  Neurological: Denies dizziness, seizures, syncope, weakness, light-headedness, numbness and headaches.  Hematological: Denies adenopathy.  Psychiatric/Behavioral: Denies confusion  Past Medical History  Diagnosis Date  . Polymyalgia ([place])   . Lumbar compression fracture (HCC)     L1 and L2  . Allergic rhinitis   . Varicose veins   . GERD (gastroesophageal reflux disease)   . Barrett's esophagus 06/1997  . Osteoporosis   . Chondromalacia   . Pneumonia   . Vaginitis   . Diverticulosis   . Tubular adenoma of colon 06/1999  . Glaucoma    Past Surgical History  Procedure Laterality Date  . Varicose vein surgery    . Fixation kyphoplasty lumbar spine  10/12/10; 09/20/11  . Lung biopsy     Social History:  reports that she has never smoked. She has never used smokeless tobacco. She reports that she does not drink alcohol or use illicit drugs.   Family history No history of heart disease  Allergies  Allergen Reactions  . Avelox [Moxifloxacin Hcl In Nacl]     Felt bad  . Biaxin [Clarithromycin] Nausea Only  . Cefuroxime Diarrhea  . Levaquin [Levofloxacin In D5w] Diarrhea  . Minocycline Nausea And Vomiting  . Paxil [Paroxetine Hcl] Nausea And Vomiting  . Prednisone Nausea Only  . Remeron [Mirtazapine]     Doesn't remember   . Sulfur Nausea And Vomiting  . Trimox [Amoxicillin] Other (See Comments)    Doesn't remember   . Viibryd [Vilazodone Hcl] Other (See Comments)  Doesn't remember     Family History  Problem Relation Age of Onset  . Colon cancer Mother 18  . Congestive Heart Failure Mother     Prior to Admission medications   Medication Sig Start Date End Date Taking? Authorizing Provider  azelastine (ASTELIN) 137 MCG/SPRAY  nasal spray Place 1 spray into the nose daily. Use in each nostril as directed   Yes Historical Provider, MD  cetirizine (ZYRTEC) 10 MG tablet Take 10 mg by mouth daily.   Yes Historical Provider, MD  cholecalciferol (VITAMIN D) 1000 UNITS tablet Take 1,000 Units by mouth daily.   Yes Historical Provider, MD  citalopram (CELEXA) 40 MG tablet Take 0.5 tablets (20 mg total) by mouth daily. 06/01/12  Yes Robbie Lis, MD  dorzolamide (TRUSOPT) 2 % ophthalmic solution Place 1 drop into both eyes 2 (two) times daily.    Yes Historical Provider, MD  fluconazole (DIFLUCAN) 150 MG tablet Take 1 tablet (150 mg total) by mouth once. As needed for yeast 03/27/15  Yes Anastasio Auerbach, MD  fluticasone (FLONASE) 50 MCG/ACT nasal spray Place 1 spray into both nostrils daily.    Yes Historical Provider, MD  HYDROcodone-acetaminophen (NORCO/VICODIN) 5-325 MG tablet Take 0.5-1 tablets by mouth 2 (two) times daily as needed. pain 04/14/15  Yes Historical Provider, MD  ibuprofen (ADVIL,MOTRIN) 200 MG tablet Take 200 mg by mouth every 8 (eight) hours as needed. For pain   Yes Historical Provider, MD  lansoprazole (PREVACID) 30 MG capsule Take 30 mg by mouth daily.   Yes Historical Provider, MD  latanoprost (XALATAN) 0.005 % ophthalmic solution Place 1 drop into both eyes at bedtime.   Yes Historical Provider, MD  LORazepam (ATIVAN) 1 MG tablet Take 1 mg by mouth 2 (two) times daily. 04/14/15  Yes Historical Provider, MD  methocarbamol (ROBAXIN) 500 MG tablet Take 500 mg by mouth 2 (two) times daily as needed. Muscle spasms 04/02/15  Yes Historical Provider, MD  Multiple Vitamin (MULTIVITAMIN) tablet Take 1 tablet by mouth daily.   Yes Historical Provider, MD  Probiotic Product (ALIGN) 4 MG CAPS Take 4 mg by mouth daily.   Yes Historical Provider, MD     Physical Exam:  Filed Vitals:   05/22/15 1449 05/22/15 1555  BP: 116/72   Pulse: 66   Temp:  97.9 F (36.6 C)  TempSrc:  Oral  Resp: 16   SpO2: 93%      Constitutional: Vital signs reviewed.  Elderly thin built female not in distress HEENT: no pallor, no icterus, moist oral mucosa, no cervical lymphadenopathy Cardiovascular: RRR, S1 normal, S2 normal, no MRG Chest: CTAB, no wheezes, rales, or rhonchi Abdominal: Soft. Non-tender, non-distended, bowel sounds are normal,  Ext: warm, no edema, pain with limited mobility of the right hip, small laceration over right forearm  Neurological: Alert and oriented   Labs on Admission:  Basic Metabolic Panel:  Recent Labs Lab 05/22/15 1512  NA 136  K 4.3  CL 104  CO2 21*  GLUCOSE 87  BUN 23*  CREATININE 0.85  CALCIUM 9.2   Liver Function Tests: No results for input(s): AST, ALT, ALKPHOS, BILITOT, PROT, ALBUMIN in the last 168 hours. No results for input(s): LIPASE, AMYLASE in the last 168 hours. No results for input(s): AMMONIA in the last 168 hours. CBC:  Recent Labs Lab 05/22/15 1512  WBC 9.8  NEUTROABS 7.7  HGB 12.4  HCT 39.2  MCV 92.0  PLT 292   Cardiac Enzymes: No results for input(s):  CKTOTAL, CKMB, CKMBINDEX, TROPONINI in the last 168 hours. BNP: Invalid input(s): POCBNP CBG: No results for input(s): GLUCAP in the last 168 hours.  Radiological Exams on Admission: Ct Hip Right Wo Contrast  05/22/2015  CLINICAL DATA:  [place] today and injured right hip. Abnormal hip x-rays. EXAM: CT OF THE RIGHT HIP WITHOUT CONTRAST TECHNIQUE: Multidetector CT imaging of the right hip was performed according to the standard protocol. Multiplanar CT image reconstructions were also generated. COMPARISON:  Radiographs 05/22/2015. FINDINGS: Mildly impacted and slightly displaced fracture at the base of the femoral neck and involving the upper aspect of the intertrochanteric region of the hip. Mild degenerative changes involving the hip. No evidence of AVN. Chondrocalcinosis is noted. The visualized right hemipelvis is intact. Moderate degenerative changes at the pubic symphysis. No acetabular  fracture. IMPRESSION: Fracture at the base of the cervical neck also involving the intertrochanteric region of the femur. Electronically Signed   By: Marijo Sanes M.D.   On: 05/22/2015 16:20   Dg Hip Unilat With Pelvis 2-3 Views Right  05/22/2015  CLINICAL DATA:  Fall walking the dog today, right hip pain EXAM: DG HIP (WITH OR WITHOUT PELVIS) 2-3V RIGHT COMPARISON:  12/17/2011 FINDINGS: Four views of the right hip submitted. Study is limited by diffuse osteopenia. There is shortened appearance of the femoral neck with slight rotation of femoral head. Findings are highly suspicious for impacted fracture of the right femoral neck. Further correlation with CT scan or MRI is recommended. IMPRESSION: There is shortened appearance of the femoral neck with slight rotation of femoral head. Findings are highly suspicious for impacted fracture of the right femoral neck. Further correlation with CT scan or MRI is recommended. These results were called by telephone at the time of interpretation on 05/22/2015 at 3:46 pm to Dr. [name] , who verbally acknowledged these results. Electronically Signed   By: Lahoma Crocker M.D.   On: 05/22/2015 15:47    EKG: Independently reviewed, sinus rhythm at 78, T-wave inversion in lateral leads (unchanged from prior EKG from 2014)  Assessment/Plan  Principal problem Closed fracture of right femoral neck Secondary to fall. No other injury sustained. ED physician spoke with orthopedic surgeon Dr. Frederik Pear recommends transferring patient to [facility] and he would all. On her tomorrow morning around 11 AM. History fracture order set in place.  -Pain control with when necessary Vicodin and IV morphine. Patient is on Robaxin at home for back spasms. I will switch to IV. -EKG unremarkable. Check chest x-ray. Patient does not have any underlying heart disease, she is well functional for her age and no other significant underlying comorbidities. She does not need further  cardiac workup preoperatively. Also does not need perioperative beta blocker. -Bowel regimen added. Patient lives with her son at home. Social worker consulted for nursing home placement posthospitalization. Dietitian consulted.  Active Problems:   Esophageal reflux Continue Protonix. Patient complains of increased reflux symptoms. Added pepcid.   Resume remaining home medications.   Transfer to medical for at [facility].  Diet: Regular. Nothing by mouth after midnight.  DVT prophylaxis: sq lovenox   Code Status: Full code Family Communication:  None at bedside. Spoke with son [name] on the phone. Disposition Plan: admit to [place]  Kenzie Flakes, [place] [facility] Pager [phone]  Total time spent on admission :50 minutes  If 7PM-7AM, please contact night-coverage [email or web address] Password [facility] 05/22/2015, 5:42 PM

## 2015-05-22 NOTE — ED Notes (Signed)
[place] traction needed once pt is in a regular hospital bed; cannot apply while in ED stretcher. MD notified.

## 2015-05-22 NOTE — Consult Note (Addendum)
Reason for Consult: Minimally displaced right basicervical femoral neck fracture Referring Physician:  Chris Lawyer  Katrina Burnett is an 82 y.o. female.  HPI: 82-year-old female, independent ambulator, who lives in her own home.  He was outside walking her dog another dog approached she lost her balance and fell onto her right hip sustaining a minimally displaced right femoral neck basicervical fracture.  In the ED, she denied any other injuries.  She denied loss of consciousness.  She was seen in the emergency room by Dr. Lawler and is accompanied by her son, Katrina Burnett.  X-rays were obtained in the [place] emergency room, consistent with the basicervical femoral neck fracture confirmed by CT scan.  Orthopedics was counseled that at 1700 hrs., 05/22/2015.  Past Medical History  Diagnosis Date  . Polymyalgia (HCC)   . Lumbar compression fracture (HCC)     L1 and L2  . Allergic rhinitis   . Varicose veins   . GERD (gastroesophageal reflux disease)   . Barrett's esophagus 06/1997  . Osteoporosis   . Chondromalacia   . Pneumonia   . Vaginitis   . Diverticulosis   . Tubular adenoma of colon 06/1999  . Glaucoma     Past Surgical History  Procedure Laterality Date  . Varicose vein surgery    . Fixation kyphoplasty lumbar spine  10/12/10; 09/20/11  . Lung biopsy      Family History  Problem Relation Age of Onset  . Colon cancer Mother 77  . Congestive Heart Failure Mother     Social History:  reports that she has never smoked. She has never used smokeless tobacco. She reports that she does not drink alcohol or use illicit drugs.  Allergies:  Allergies  Allergen Reactions  . Avelox [Moxifloxacin Hcl In Nacl]     Felt bad  . Biaxin [Clarithromycin] Nausea Only  . Cefuroxime Diarrhea  . Levaquin [Levofloxacin In D5w] Diarrhea  . Minocycline Nausea And Vomiting  . Paxil [Paroxetine Hcl] Nausea And Vomiting  . Prednisone Nausea Only  . Remeron [Mirtazapine]     Doesn't remember    . Sulfur Nausea And Vomiting  . Trimox [Amoxicillin] Other (See Comments)    Doesn't remember   . Viibryd [Vilazodone Hcl] Other (See Comments)    Doesn't remember     Medications: I have reviewed the patient's current medications.  Results for orders placed or performed during the hospital encounter of 05/22/15 (from the past 48 hour(s))  Basic metabolic panel     Status: Abnormal   Collection Time: 05/22/15  3:12 PM  Result Value Ref Range   Sodium 136 135 - 145 mmol/L   Potassium 4.3 3.5 - 5.1 mmol/L   Chloride 104 101 - 111 mmol/L   CO2 21 (L) 22 - 32 mmol/L   Glucose, Bld 87 65 - 99 mg/dL   BUN 23 (H) 6 - 20 mg/dL   Creatinine, Ser 0.85 0.44 - 1.00 mg/dL   Calcium 9.2 8.9 - 10.3 mg/dL   GFR calc non Af Amer 59 (L) >60 mL/min   GFR calc Af Amer >60 >60 mL/min    Comment: (NOTE) The eGFR has been calculated using the CKD EPI equation. This calculation has not been validated in all clinical situations. eGFR's persistently <60 mL/min signify possible Chronic Kidney Disease.    Anion gap 11 5 - 15  CBC with Differential     Status: None   Collection Time: 05/22/15  3:12 PM  Result Value Ref Range     WBC 9.8 4.0 - 10.5 K/uL   RBC 4.26 3.87 - 5.11 MIL/uL   Hemoglobin 12.4 12.0 - 15.0 g/dL   HCT 39.2 36.0 - 46.0 %   MCV 92.0 78.0 - 100.0 fL   MCH 29.1 26.0 - 34.0 pg   MCHC 31.6 30.0 - 36.0 g/dL   RDW 13.0 11.5 - 15.5 %   Platelets 292 150 - 400 K/uL   Neutrophils Relative % 79 %   Neutro Abs 7.7 1.7 - 7.7 K/uL   Lymphocytes Relative 12 %   Lymphs Abs 1.1 0.7 - 4.0 K/uL   Monocytes Relative 6 %   Monocytes Absolute 0.6 0.1 - 1.0 K/uL   Eosinophils Relative 3 %   Eosinophils Absolute 0.3 0.0 - 0.7 K/uL   Basophils Relative 0 %   Basophils Absolute 0.0 0.0 - 0.1 K/uL    Ct Hip Right Wo Contrast  05/22/2015  CLINICAL DATA:  Fell today and injured right hip. Abnormal hip x-rays. EXAM: CT OF THE RIGHT HIP WITHOUT CONTRAST TECHNIQUE: Multidetector CT imaging of the right  hip was performed according to the standard protocol. Multiplanar CT image reconstructions were also generated. COMPARISON:  Radiographs 05/22/2015. FINDINGS: Mildly impacted and slightly displaced fracture at the base of the femoral neck and involving the upper aspect of the intertrochanteric region of the hip. Mild degenerative changes involving the hip. No evidence of AVN. Chondrocalcinosis is noted. The visualized right hemipelvis is intact. Moderate degenerative changes at the pubic symphysis. No acetabular fracture. IMPRESSION: Fracture at the base of the cervical neck also involving the intertrochanteric region of the femur. Electronically Signed   By: P.  Gallerani M.D.   On: 05/22/2015 16:20   Dg Hip Unilat With Pelvis 2-3 Views Right  05/22/2015  CLINICAL DATA:  Fall walking the dog today, right hip pain EXAM: DG HIP (WITH OR WITHOUT PELVIS) 2-3V RIGHT COMPARISON:  12/17/2011 FINDINGS: Four views of the right hip submitted. Study is limited by diffuse osteopenia. There is shortened appearance of the femoral neck with slight rotation of femoral head. Findings are highly suspicious for impacted fracture of the right femoral neck. Further correlation with CT scan or MRI is recommended. IMPRESSION: There is shortened appearance of the femoral neck with slight rotation of femoral head. Findings are highly suspicious for impacted fracture of the right femoral neck. Further correlation with CT scan or MRI is recommended. These results were called by telephone at the time of interpretation on 05/22/2015 at 3:46 pm to Dr. CHRISTOPHER LAWYER , who verbally acknowledged these results. Electronically Signed   By: Liviu  Pop M.D.   On: 05/22/2015 15:47    ROS she denies any shortness of breath, no chest pain, no loss of consciousness, no other significant injuries. Blood pressure 116/72, pulse 66, temperature 97.9 F (36.6 C), temperature source Oral, resp. rate 16, SpO2 93 %. Physical Exam: Patient examined in  the ED there are no cuts scrapes or abrasions to her right hip she can move her foot up and down without difficulty her toes were pink and well perfused. She was quite tender to palpation over the greater trochanter. Her is no obvious bruising. She also denied any other injuries.    Assessment: Minimally displaced right femoral neck basicervical fracture in an 82-year-old woman who lives independently and is relatively healthy.  Plan: Earliest available OR time is 11:30 AM [facility] as [facility].  Earliest OR time at [place] would be after 1700 hrs. tomorrow   as an add-on. Situation was discussed with the patient's son by telephone and he agrees with open reduction internal fixation of the right hip using plate and screws.  Risks and benefits of surgery  were discussed including infection, nerve, vessel and tendon damage, as well as possible need for further surgery or anesthesia complications.  Nonoperative treatment would leave her bedbound for 2-3 months with a markedly increased risk of pneumonia, blood clots and bedsores. Patient will be transferred by [facility]  to [place] for admission to the hospitalist service this evening  Lidya Mccalister J 05/22/2015, 5:40 PM      

## 2015-05-22 NOTE — ED Notes (Signed)
Pt is still very uncomfortable despite receiving pain medication, and she is increasingly anxious regarding the wait for transfer to [facility].  She asked RN to contact her son [name] at [phone] but there was no answer.  Voicemail is full.  Pt notified.

## 2015-05-22 NOTE — ED Provider Notes (Signed)
CSN: [id number]     Arrival date & time 05/22/15  1406 History   First MD Initiated Contact with Patient 05/22/15 1426     Chief Complaint  Patient presents with  . Hip Pain  . Fall     (Consider location/radiation/quality/duration/timing/severity/associated sxs/prior Treatment) HPI Katrina Burnett is an 82 year old woman with a past medical history of osteoporosis who presents to the ED today after a fall. Around 2pm patient was outside walking her dog, and after being approached by other dogs, she lost her balance and fell on her right hip. She sustained no injury to her head or arms. Patient was taken to the hospital accompanied by her son as she was experiencing severe right hip pain with decreased motion of her right hip and knee. Patient denies numbness and incontinence.  Past Medical History  Diagnosis Date  . Polymyalgia ([place])   . Lumbar compression fracture (HCC)     L1 and L2  . Allergic rhinitis   . Varicose veins   . GERD (gastroesophageal reflux disease)   . Barrett's esophagus 06/1997  . Osteoporosis   . Chondromalacia   . Pneumonia   . Vaginitis   . Diverticulosis   . Tubular adenoma of colon 06/1999  . Glaucoma    Past Surgical History  Procedure Laterality Date  . Varicose vein surgery    . Fixation kyphoplasty lumbar spine  10/12/10; 09/20/11  . Lung biopsy     Family History  Problem Relation Age of Onset  . Colon cancer Mother 63  . Congestive Heart Failure Mother    Social History  Substance Use Topics  . Smoking status: Never Smoker   . Smokeless tobacco: Never Used     Comment: At age 39 yrs old but nothing since   . Alcohol Use: No   OB History    Gravida Para Term Preterm AB TAB SAB Ectopic Multiple Living   4 4 4       4      Review of Systems  Genitourinary: Negative for difficulty urinating.  Musculoskeletal: Positive for gait problem.       Right leg posterior hip pain  Allergic/Immunologic: Positive for environmental allergies.   Neurological: Negative for dizziness, syncope and numbness.      Allergies  Avelox; Biaxin; Cefuroxime; Levaquin; Minocycline; Paxil; Prednisone; Remeron; Trimox; and Viibryd  Home Medications   Prior to Admission medications   Medication Sig Start Date End Date Taking? Authorizing Provider  azelastine (ASTELIN) 137 MCG/SPRAY nasal spray Place 1 spray into the nose daily. Use in each nostril as directed    Historical Provider, MD  cetirizine (ZYRTEC) 10 MG tablet Take 10 mg by mouth daily.    Historical Provider, MD  cholecalciferol (VITAMIN D) 1000 UNITS tablet Take 1,000 Units by mouth daily.    Historical Provider, MD  citalopram (CELEXA) 40 MG tablet Take 0.5 tablets (20 mg total) by mouth daily. 06/01/12   Robbie Lis, MD  dorzolamide (TRUSOPT) 2 % ophthalmic solution 1 drop 2 (two) times daily.    Historical Provider, MD  fluconazole (DIFLUCAN) 150 MG tablet Take 1 tablet (150 mg total) by mouth once. As needed for yeast 03/27/15   Anastasio Auerbach, MD  fluticasone (FLONASE) 50 MCG/ACT nasal spray Place into the nose daily.    Historical Provider, MD  Ibandronate Sodium (BONIVA IV) Inject into the vein as directed. EVERY 3 MONTHS    Historical Provider, MD  ibuprofen (ADVIL,MOTRIN) 200 MG tablet Take 200  mg by mouth every 8 (eight) hours as needed. For pain    Historical Provider, MD  lansoprazole (PREVACID) 30 MG capsule Take 30 mg by mouth daily.    Historical Provider, MD  latanoprost (XALATAN) 0.005 % ophthalmic solution Place 1 drop into both eyes at bedtime.    Historical Provider, MD  Multiple Vitamin (MULTIVITAMIN) tablet Take 1 tablet by mouth daily.    Historical Provider, MD  Probiotic Product (ALIGN) 4 MG CAPS Take 4 mg by mouth daily.    Historical Provider, MD   BP 116/72 mmHg  Pulse 66  Resp 16  SpO2 93% Physical Exam  Constitutional: She is oriented to person, place, and time. She appears well-developed and well-nourished. She appears distressed.  Thin    HENT:  Head: Normocephalic and atraumatic.  Mouth/Throat: Oropharynx is clear and moist.  Eyes: Pupils are equal, round, and reactive to light.  Neck: Normal range of motion. Neck supple.  Cardiovascular: Normal rate, regular rhythm and intact distal pulses.  Exam reveals no gallop and no friction rub.   No murmur heard. Pulmonary/Chest: Effort normal and breath sounds normal. No respiratory distress.  Musculoskeletal:       Right hip: She exhibits decreased range of motion, decreased strength, tenderness and bony tenderness. She exhibits no laceration.  Tender to palpation at posterior aspect of right hip with no erythema or edema. Patient unable to move leg at hip joint or knee joint due to pain.  Neurological: She is alert and oriented to person, place, and time. She exhibits normal muscle tone. Coordination normal.  Unable to perform strength testing on right leg due to pain  Skin: Skin is warm and dry. No rash noted. No erythema.  Nursing note and vitals reviewed.   ED Course  Procedures (including critical care time) Labs Review Labs Reviewed  BASIC METABOLIC PANEL - Abnormal; Notable for the following:    CO2 21 (*)    BUN 23 (*)    GFR calc non Af Amer 59 (*)    All other components within normal limits  CBC WITH DIFFERENTIAL/PLATELET    Imaging Review No results found. I have personally reviewed and evaluated these images and lab results as part of my medical decision-making.    I will speak with the orthopedic surgeon hospital's medical admission for the patient.  Patient is given the results and all questions were answered.  [name], PA-C 05/22/15 1635  Virgel Manifold, MD 05/25/15 [phone]

## 2015-05-22 NOTE — ED Notes (Signed)
Pt arrived by EMS.  States she was walking her little dog and her neighbor was walking their 2 big dogs that knocked her down resulting in RT hip pain. Pedal pulse present. EMS gave 186mcg (30mcg at a time) fentanyl to LAC #20 which brought her pain from 10/10 to 5/10.  VS by EMS: 112/88, 84, 16, 96% RA.  NO LOC or other injuries.  She has chronic back pain.

## 2015-05-23 ENCOUNTER — Inpatient Hospital Stay (HOSPITAL_COMMUNITY): Payer: Medicare Other | Admitting: Certified Registered Nurse Anesthetist

## 2015-05-23 ENCOUNTER — Inpatient Hospital Stay (HOSPITAL_COMMUNITY): Payer: Medicare Other

## 2015-05-23 ENCOUNTER — Encounter (HOSPITAL_COMMUNITY): Payer: Self-pay | Admitting: Certified Registered Nurse Anesthetist

## 2015-05-23 ENCOUNTER — Encounter (HOSPITAL_COMMUNITY)
Admission: EM | Disposition: A | Discharge status: Rehabilitation Facility | Payer: Self-pay | Source: Home / Self Care | Attending: Internal Medicine

## 2015-05-23 DIAGNOSIS — K219 Gastro-esophageal reflux disease without esophagitis: Secondary | ICD-10-CM

## 2015-05-23 DIAGNOSIS — S72001D Fracture of unspecified part of neck of right femur, subsequent encounter for closed fracture with routine healing: Secondary | ICD-10-CM

## 2015-05-23 DIAGNOSIS — S7291XA Unspecified fracture of right femur, initial encounter for closed fracture: Secondary | ICD-10-CM | POA: Diagnosis present

## 2015-05-23 HISTORY — PX: ORIF HIP FRACTURE: SHX2125

## 2015-05-23 LAB — CBC
HEMATOCRIT: 39.3 % (ref 36.0–46.0)
HEMOGLOBIN: 12.8 g/dL (ref 12.0–15.0)
MCH: 29.5 pg (ref 26.0–34.0)
MCHC: 32.6 g/dL (ref 30.0–36.0)
MCV: 90.6 fL (ref 78.0–100.0)
Platelets: 282 10*3/uL (ref 150–400)
RBC: 4.34 MIL/uL (ref 3.87–5.11)
RDW: 12.8 % (ref 11.5–15.5)
WBC: 12.7 10*3/uL — ABNORMAL HIGH (ref 4.0–10.5)

## 2015-05-23 LAB — SURGICAL PCR SCREEN
MRSA, PCR: NEGATIVE
Staphylococcus aureus: POSITIVE — AB

## 2015-05-23 LAB — CREATININE, SERUM
Creatinine, Ser: 0.83 mg/dL (ref 0.44–1.00)
GFR calc Af Amer: >60 mL/min (ref >60)

## 2015-05-23 SURGERY — OPEN REDUCTION INTERNAL FIXATION HIP
Anesthesia: General | Laterality: Right

## 2015-05-23 MED ORDER — ENSURE ENLIVE PO LIQD
237.0000 mL | Freq: Two times a day (BID) | ORAL | Status: DC
  Administered 2015-05-26 (×2): 237 mL via ORAL
  Filled 2015-05-23: qty 237

## 2015-05-23 MED ORDER — LACTATED RINGERS IV SOLN: INTRAVENOUS | Status: DC

## 2015-05-23 MED ORDER — ADULT MULTIVITAMIN W/MINERALS CH
1.0000 | ORAL_TABLET | Freq: Every day | ORAL | Status: DC
Start: 2015-05-23 — End: 2015-05-23

## 2015-05-23 MED ORDER — FENTANYL CITRATE (PF) 250 MCG/5ML IJ SOLN
INTRAMUSCULAR | Status: AC
Start: 2015-05-23 — End: 2015-05-23
  Filled 2015-05-23: qty 5

## 2015-05-23 MED ORDER — FENTANYL CITRATE (PF) 100 MCG/2ML IJ SOLN
50.0000 ug | Freq: Once | INTRAMUSCULAR | Status: AC
  Administered 2015-05-23: 50 ug via INTRAVENOUS

## 2015-05-23 MED ORDER — LACTATED RINGERS IV SOLN
INTRAVENOUS | Status: DC
  Administered 2015-05-23: 12:00:00 via INTRAVENOUS

## 2015-05-23 MED ORDER — METOCLOPRAMIDE HCL 5 MG/ML IJ SOLN: 5.0000 mg | Freq: Three times a day (TID) | INTRAMUSCULAR | Status: DC | PRN

## 2015-05-23 MED ORDER — FENTANYL CITRATE (PF) 100 MCG/2ML IJ SOLN
INTRAMUSCULAR | Status: AC
  Filled 2015-05-23: qty 2

## 2015-05-23 MED ORDER — PHENOL 1.4 % MT LIQD
1.0000 | OROMUCOSAL | Status: DC | PRN
  Administered 2015-05-23: 1 via OROMUCOSAL
  Filled 2015-05-23: qty 177

## 2015-05-23 MED ORDER — MORPHINE SULFATE (PF) 2 MG/ML IV SOLN
2.0000 mg | INTRAVENOUS | Status: DC | PRN
  Administered 2015-05-23: 2 mg via INTRAVENOUS
  Administered 2015-05-23: 0.5 mg via INTRAVENOUS
  Administered 2015-05-23 – 2015-05-26 (×9): 2 mg via INTRAVENOUS
  Filled 2015-05-23 (×10): qty 1

## 2015-05-23 MED ORDER — CHLORHEXIDINE GLUCONATE CLOTH 2 % EX PADS: 6.0000 | MEDICATED_PAD | Freq: Every day | CUTANEOUS | Status: DC

## 2015-05-23 MED ORDER — ACETAMINOPHEN 325 MG PO TABS: 650.0000 mg | ORAL_TABLET | Freq: Four times a day (QID) | ORAL | Status: DC | PRN

## 2015-05-23 MED ORDER — ONDANSETRON HCL 4 MG/2ML IJ SOLN
INTRAMUSCULAR | Status: DC | PRN
  Administered 2015-05-23: 4 mg via INTRAVENOUS

## 2015-05-23 MED ORDER — BUPIVACAINE-EPINEPHRINE (PF) 0.5% -1:200000 IJ SOLN
INTRAMUSCULAR | Status: AC
  Filled 2015-05-23: qty 30

## 2015-05-23 MED ORDER — ACETAMINOPHEN 650 MG RE SUPP: 650.0000 mg | Freq: Four times a day (QID) | RECTAL | Status: DC | PRN

## 2015-05-23 MED ORDER — PROPOFOL 10 MG/ML IV BOLUS
INTRAVENOUS | Status: DC | PRN
  Administered 2015-05-23: 30 mg via INTRAVENOUS
  Administered 2015-05-23: 50 mg via INTRAVENOUS

## 2015-05-23 MED ORDER — ENOXAPARIN SODIUM 40 MG/0.4ML ~~LOC~~ SOLN: 40.0000 mg | SUBCUTANEOUS | Status: DC

## 2015-05-23 MED ORDER — MENTHOL 3 MG MT LOZG
1.0000 | LOZENGE | OROMUCOSAL | Status: DC | PRN
  Filled 2015-05-23: qty 9

## 2015-05-23 MED ORDER — PHENYLEPHRINE HCL 10 MG/ML IJ SOLN
INTRAMUSCULAR | Status: DC | PRN
  Administered 2015-05-23 (×2): 80 ug via INTRAVENOUS
  Administered 2015-05-23: 40 ug via INTRAVENOUS
  Administered 2015-05-23 (×2): 80 ug via INTRAVENOUS

## 2015-05-23 MED ORDER — EPHEDRINE SULFATE 50 MG/ML IJ SOLN
INTRAMUSCULAR | Status: DC | PRN
  Administered 2015-05-23: 5 mg via INTRAVENOUS

## 2015-05-23 MED ORDER — SODIUM CHLORIDE 0.9 % IR SOLN
Status: DC | PRN
  Administered 2015-05-23: 1000 mL

## 2015-05-23 MED ORDER — MENTHOL 3 MG MT LOZG: 1.0000 | LOZENGE | OROMUCOSAL | Status: DC | PRN

## 2015-05-23 MED ORDER — MUPIROCIN 2 % EX OINT
1.0000 "application " | TOPICAL_OINTMENT | Freq: Two times a day (BID) | CUTANEOUS | Status: DC
  Administered 2015-05-23 – 2015-05-26 (×8): 1 via NASAL
  Filled 2015-05-23: qty 22

## 2015-05-23 MED ORDER — FENTANYL CITRATE (PF) 100 MCG/2ML IJ SOLN
INTRAMUSCULAR | Status: AC
  Administered 2015-05-23: 50 ug via INTRAVENOUS
  Filled 2015-05-23: qty 2

## 2015-05-23 MED ORDER — LIDOCAINE HCL (CARDIAC) 20 MG/ML IV SOLN
INTRAVENOUS | Status: DC | PRN
  Administered 2015-05-23: 50 mg via INTRAVENOUS

## 2015-05-23 MED ORDER — MIDAZOLAM HCL 2 MG/2ML IJ SOLN
INTRAMUSCULAR | Status: AC
  Filled 2015-05-23: qty 2

## 2015-05-23 MED ORDER — PROPOFOL 10 MG/ML IV BOLUS
INTRAVENOUS | Status: AC
  Filled 2015-05-23: qty 20

## 2015-05-23 MED ORDER — FENTANYL CITRATE (PF) 100 MCG/2ML IJ SOLN
25.0000 ug | INTRAMUSCULAR | Status: DC | PRN
  Administered 2015-05-23 (×2): 25 ug via INTRAVENOUS

## 2015-05-23 MED ORDER — SUGAMMADEX SODIUM 200 MG/2ML IV SOLN
INTRAVENOUS | Status: DC | PRN
  Administered 2015-05-23: 200 mg via INTRAVENOUS

## 2015-05-23 MED ORDER — FENTANYL CITRATE (PF) 100 MCG/2ML IJ SOLN
INTRAMUSCULAR | Status: DC | PRN
  Administered 2015-05-23: 25 ug via INTRAVENOUS
  Administered 2015-05-23 (×2): 50 ug via INTRAVENOUS

## 2015-05-23 MED ORDER — LACTATED RINGERS IV SOLN
INTRAVENOUS | Status: DC | PRN
  Administered 2015-05-23 (×2): via INTRAVENOUS

## 2015-05-23 MED ORDER — KCL IN DEXTROSE-NACL 20-5-0.45 MEQ/L-%-% IV SOLN
INTRAVENOUS | Status: DC
  Administered 2015-05-24: 10:00:00 via INTRAVENOUS
  Filled 2015-05-23: qty 1000

## 2015-05-23 MED ORDER — BUPIVACAINE-EPINEPHRINE 0.5% -1:200000 IJ SOLN
INTRAMUSCULAR | Status: DC | PRN
  Administered 2015-05-23: 10 mL

## 2015-05-23 MED ORDER — HYDROCODONE-ACETAMINOPHEN 5-325 MG PO TABS
0.5000 | ORAL_TABLET | Freq: Four times a day (QID) | ORAL | Status: DC | PRN
Start: 2015-05-23 — End: 2015-05-26

## 2015-05-23 MED ORDER — METOCLOPRAMIDE HCL 5 MG PO TABS: 5.0000 mg | ORAL_TABLET | Freq: Three times a day (TID) | ORAL | Status: DC | PRN

## 2015-05-23 MED ORDER — ROCURONIUM BROMIDE 100 MG/10ML IV SOLN
INTRAVENOUS | Status: DC | PRN
  Administered 2015-05-23: 10 mg via INTRAVENOUS
  Administered 2015-05-23: 30 mg via INTRAVENOUS
  Administered 2015-05-23: 10 mg via INTRAVENOUS

## 2015-05-23 MED ORDER — PHENOL 1.4 % MT LIQD: 1.0000 | OROMUCOSAL | Status: DC | PRN

## 2015-05-23 MED ORDER — ONDANSETRON HCL 4 MG/2ML IJ SOLN: 4.0000 mg | Freq: Once | INTRAMUSCULAR | Status: DC | PRN

## 2015-05-23 MED ORDER — CEFAZOLIN SODIUM-DEXTROSE 2-3 GM-% IV SOLR
2.0000 g | Freq: Once | INTRAVENOUS | Status: AC
  Administered 2015-05-23: 2 g via INTRAVENOUS
  Filled 2015-05-23: qty 50

## 2015-05-23 MED ORDER — ZOLPIDEM TARTRATE 5 MG PO TABS
5.0000 mg | ORAL_TABLET | Freq: Every evening | ORAL | Status: DC | PRN
  Administered 2015-05-24: 5 mg via ORAL
  Filled 2015-05-23: qty 1

## 2015-05-23 SURGICAL SUPPLY — 69 items
BIT DRILL 3.8MM (BIT) ×1
BIT DRILL TWIST 3.8X7 (BIT) ×1 IMPLANT
BLADE SAW SGTL NAR THIN XSHT (BLADE) IMPLANT
BUR SURG 4X8 MED (BURR) IMPLANT
BURR SURG 4MMX8MM MEDIUM (BURR)
BURR SURG 4X8 MED (BURR)
COVER BACK TABLE 24X17X13 BIG (DRAPES) IMPLANT
COVER SURGICAL LIGHT HANDLE (MISCELLANEOUS) ×4 IMPLANT
DRAPE IMP U-DRAPE 54X76 (DRAPES) ×3 IMPLANT
DRAPE INCISE IOBAN 66X45 STRL (DRAPES) ×2 IMPLANT
DRAPE ORTHO SPLIT 77X108 STRL (DRAPES) ×3
DRAPE PROXIMA HALF (DRAPES) ×3 IMPLANT
DRAPE SURG ORHT 6 SPLT 77X108 (DRAPES) ×1 IMPLANT
DRAPE U-SHAPE 47X51 STRL (DRAPES) ×3 IMPLANT
DRILL BIT 7/64X5 (BIT) ×3 IMPLANT
DRSG MEPILEX BORDER 4X12 (GAUZE/BANDAGES/DRESSINGS) ×1 IMPLANT
DRSG MEPILEX BORDER 4X8 (GAUZE/BANDAGES/DRESSINGS) ×3 IMPLANT
DURAPREP 26ML APPLICATOR (WOUND CARE) ×3 IMPLANT
ELECT BLADE 4.0 EZ CLEAN MEGAD (MISCELLANEOUS)
ELECT REM PT RETURN 9FT ADLT (ELECTROSURGICAL) ×3
ELECTRODE BLDE 4.0 EZ CLN MEGD (MISCELLANEOUS) IMPLANT
ELECTRODE REM PT RTRN 9FT ADLT (ELECTROSURGICAL) ×1 IMPLANT
GAUZE XEROFORM 1X8 LF (GAUZE/BANDAGES/DRESSINGS) ×1 IMPLANT
GLOVE BIO SURGEON STRL SZ7 (GLOVE) ×2 IMPLANT
GLOVE BIO SURGEON STRL SZ7.5 (GLOVE) ×3 IMPLANT
GLOVE BIO SURGEON STRL SZ8.5 (GLOVE) ×3 IMPLANT
GLOVE BIOGEL PI IND STRL 7.0 (GLOVE) IMPLANT
GLOVE BIOGEL PI IND STRL 7.5 (GLOVE) IMPLANT
GLOVE BIOGEL PI IND STRL 8 (GLOVE) ×1 IMPLANT
GLOVE BIOGEL PI IND STRL 9 (GLOVE) ×1 IMPLANT
GLOVE BIOGEL PI INDICATOR 7.0 (GLOVE) ×6
GLOVE BIOGEL PI INDICATOR 7.5 (GLOVE) ×4
GLOVE BIOGEL PI INDICATOR 8 (GLOVE) ×4
GLOVE BIOGEL PI INDICATOR 9 (GLOVE) ×2
GOWN STRL REUS W/ TWL LRG LVL3 (GOWN DISPOSABLE) ×2 IMPLANT
GOWN STRL REUS W/ TWL XL LVL3 (GOWN DISPOSABLE) ×2 IMPLANT
GOWN STRL REUS W/TWL LRG LVL3 (GOWN DISPOSABLE) ×9
GOWN STRL REUS W/TWL XL LVL3 (GOWN DISPOSABLE) ×6
HOOD PEEL AWAY FACE SHEILD DIS (HOOD) ×2 IMPLANT
KIT BASIN OR (CUSTOM PROCEDURE TRAY) ×3 IMPLANT
KIT ROOM TURNOVER OR (KITS) ×3 IMPLANT
LINER BOOT UNIVERSAL DISP (MISCELLANEOUS) ×1 IMPLANT
MANIFOLD NEPTUNE II (INSTRUMENTS) ×3 IMPLANT
NEEDLE 22X1 1/2 (OR ONLY) (NEEDLE) ×3 IMPLANT
NS IRRIG 1000ML POUR BTL (IV SOLUTION) ×3 IMPLANT
PACK TOTAL JOINT (CUSTOM PROCEDURE TRAY) ×3 IMPLANT
PACK UNIVERSAL I (CUSTOM PROCEDURE TRAY) ×3 IMPLANT
PAD ARMBOARD 7.5X6 YLW CONV (MISCELLANEOUS) ×6 IMPLANT
PASSER SUT SWANSON 36MM LOOP (INSTRUMENTS) ×3 IMPLANT
PIN THREADED GUIDE ACE (PIN) ×4 IMPLANT
PLATE COMP 135D 4H STD (Plate) ×2 IMPLANT
SCREW ACECAP 36MM (Screw) ×4 IMPLANT
SCREW ACECAP 38MM (Screw) ×4 IMPLANT
SCREW LAG 80MM (Screw) ×3 IMPLANT
SCREW LAG STD 80XKY LRG STRL H (Screw) IMPLANT
SUT ETHIBOND 2 V 37 (SUTURE) ×3 IMPLANT
SUT ETHILON 3 0 FSL (SUTURE) ×3 IMPLANT
SUT FIBERWIRE #2 38 REV NDL BL (SUTURE)
SUT VIC AB 0 CT1 27 (SUTURE) ×3
SUT VIC AB 0 CT1 27XBRD ANBCTR (SUTURE) IMPLANT
SUT VIC AB 0 CTB1 27 (SUTURE) ×3 IMPLANT
SUT VIC AB 1 CTX 36 (SUTURE) ×3
SUT VIC AB 1 CTX36XBRD ANBCTR (SUTURE) ×1 IMPLANT
SUT VIC AB 2-0 CTB1 (SUTURE) ×3 IMPLANT
SUTURE FIBERWR#2 38 REV NDL BL (SUTURE) IMPLANT
SYR CONTROL 10ML LL (SYRINGE) ×3 IMPLANT
TOWEL OR 17X24 6PK STRL BLUE (TOWEL DISPOSABLE) ×3 IMPLANT
TOWEL OR 17X26 10 PK STRL BLUE (TOWEL DISPOSABLE) ×3 IMPLANT
WATER STERILE IRR 1000ML POUR (IV SOLUTION) ×4 IMPLANT

## 2015-05-23 NOTE — H&P (View-Only) (Signed)
Reason for Consult: Minimally displaced right basicervical femoral neck fracture Referring Physician:  Chris Lawyer  Katrina Burnett is an 82 y.o. female.  HPI: 82-year-old female, independent ambulator, who lives in her own home.  He was outside walking her dog another dog approached she lost her balance and fell onto her right hip sustaining a minimally displaced right femoral neck basicervical fracture.  In the ED, she denied any other injuries.  She denied loss of consciousness.  She was seen in the emergency room by Dr. Lawler and is accompanied by her son, Katrina Burnett.  X-rays were obtained in the [facility] emergency room, consistent with the basicervical femoral neck fracture confirmed by CT scan.  Orthopedics was counseled that at 1700 hrs., 05/22/2015.  Past Medical History  Diagnosis Date  . Polymyalgia (HCC)   . Lumbar compression fracture (HCC)     L1 and L2  . Allergic rhinitis   . Varicose veins   . GERD (gastroesophageal reflux disease)   . Barrett's esophagus 06/1997  . Osteoporosis   . Chondromalacia   . Pneumonia   . Vaginitis   . Diverticulosis   . Tubular adenoma of colon 06/1999  . Glaucoma     Past Surgical History  Procedure Laterality Date  . Varicose vein surgery    . Fixation kyphoplasty lumbar spine  10/12/10; 09/20/11  . Lung biopsy      Family History  Problem Relation Age of Onset  . Colon cancer Mother 77  . Congestive Heart Failure Mother     Social History:  reports that she has never smoked. She has never used smokeless tobacco. She reports that she does not drink alcohol or use illicit drugs.  Allergies:  Allergies  Allergen Reactions  . Avelox [Moxifloxacin Hcl In Nacl]     Felt bad  . Biaxin [Clarithromycin] Nausea Only  . Cefuroxime Diarrhea  . Levaquin [Levofloxacin In D5w] Diarrhea  . Minocycline Nausea And Vomiting  . Paxil [Paroxetine Hcl] Nausea And Vomiting  . Prednisone Nausea Only  . Remeron [Mirtazapine]     Doesn't remember    . Sulfur Nausea And Vomiting  . Trimox [Amoxicillin] Other (See Comments)    Doesn't remember   . Viibryd [Vilazodone Hcl] Other (See Comments)    Doesn't remember     Medications: I have reviewed the patient's current medications.  Results for orders placed or performed during the hospital encounter of 05/22/15 (from the past 48 hour(s))  Basic metabolic panel     Status: Abnormal   Collection Time: 05/22/15  3:12 PM  Result Value Ref Range   Sodium 136 135 - 145 mmol/L   Potassium 4.3 3.5 - 5.1 mmol/L   Chloride 104 101 - 111 mmol/L   CO2 21 (L) 22 - 32 mmol/L   Glucose, Bld 87 65 - 99 mg/dL   BUN 23 (H) 6 - 20 mg/dL   Creatinine, Ser 0.85 0.44 - 1.00 mg/dL   Calcium 9.2 8.9 - 10.3 mg/dL   GFR calc non Af Amer 59 (L) >60 mL/min   GFR calc Af Amer >60 >60 mL/min    Comment: (NOTE) The eGFR has been calculated using the CKD EPI equation. This calculation has not been validated in all clinical situations. eGFR's persistently <60 mL/min signify possible Chronic Kidney Disease.    Anion gap 11 5 - 15  CBC with Differential     Status: None   Collection Time: 05/22/15  3:12 PM  Result Value Ref Range     WBC 9.8 4.0 - 10.5 K/uL   RBC 4.26 3.87 - 5.11 MIL/uL   Hemoglobin 12.4 12.0 - 15.0 g/dL   HCT 39.2 36.0 - 46.0 %   MCV 92.0 78.0 - 100.0 fL   MCH 29.1 26.0 - 34.0 pg   MCHC 31.6 30.0 - 36.0 g/dL   RDW 13.0 11.5 - 15.5 %   Platelets 292 150 - 400 K/uL   Neutrophils Relative % 79 %   Neutro Abs 7.7 1.7 - 7.7 K/uL   Lymphocytes Relative 12 %   Lymphs Abs 1.1 0.7 - 4.0 K/uL   Monocytes Relative 6 %   Monocytes Absolute 0.6 0.1 - 1.0 K/uL   Eosinophils Relative 3 %   Eosinophils Absolute 0.3 0.0 - 0.7 K/uL   Basophils Relative 0 %   Basophils Absolute 0.0 0.0 - 0.1 K/uL    Ct Hip Right Wo Contrast  05/22/2015  CLINICAL DATA:  [address] today and injured right hip. Abnormal hip x-rays. EXAM: CT OF THE RIGHT HIP WITHOUT CONTRAST TECHNIQUE: Multidetector CT imaging of the right  hip was performed according to the standard protocol. Multiplanar CT image reconstructions were also generated. COMPARISON:  Radiographs 05/22/2015. FINDINGS: Mildly impacted and slightly displaced fracture at the base of the femoral neck and involving the upper aspect of the intertrochanteric region of the hip. Mild degenerative changes involving the hip. No evidence of AVN. Chondrocalcinosis is noted. The visualized right hemipelvis is intact. Moderate degenerative changes at the pubic symphysis. No acetabular fracture. IMPRESSION: Fracture at the base of the cervical neck also involving the intertrochanteric region of the femur. Electronically Signed   By: Marijo Sanes M.D.   On: 05/22/2015 16:20   Dg Hip Unilat With Pelvis 2-3 Views Right  05/22/2015  CLINICAL DATA:  Fall walking the dog today, right hip pain EXAM: DG HIP (WITH OR WITHOUT PELVIS) 2-3V RIGHT COMPARISON:  12/17/2011 FINDINGS: Four views of the right hip submitted. Study is limited by diffuse osteopenia. There is shortened appearance of the femoral neck with slight rotation of femoral head. Findings are highly suspicious for impacted fracture of the right femoral neck. Further correlation with CT scan or MRI is recommended. IMPRESSION: There is shortened appearance of the femoral neck with slight rotation of femoral head. Findings are highly suspicious for impacted fracture of the right femoral neck. Further correlation with CT scan or MRI is recommended. These results were called by telephone at the time of interpretation on 05/22/2015 at 3:46 pm to Dr. [name] , who verbally acknowledged these results. Electronically Signed   By: Lahoma Crocker M.D.   On: 05/22/2015 15:47    ROS she denies any shortness of breath, no chest pain, no loss of consciousness, no other significant injuries. Blood pressure 116/72, pulse 66, temperature 97.9 F (36.6 C), temperature source Oral, resp. rate 16, SpO2 93 %. Physical Exam: Patient examined in  the ED there are no cuts scrapes or abrasions to her right hip she can move her foot up and down without difficulty her toes were pink and well perfused. She was quite tender to palpation over the greater trochanter. Her is no obvious bruising. She also denied any other injuries.    Assessment: Minimally displaced right femoral neck basicervical fracture in an 81 year old woman who lives independently and is relatively healthy.  Plan: Earliest available OR time is 11:30 AM tomorrow at [facility] as scheduled case.  Earliest OR time at [facility] would be after 1700 hrs. tomorrow  as an add-on. Situation was discussed with the patient's son by telephone and he agrees with open reduction internal fixation of the right hip using plate and screws.  Risks and benefits of surgery  were discussed including infection, nerve, vessel and tendon damage, as well as possible need for further surgery or anesthesia complications.  Nonoperative treatment would leave her bedbound for 2-3 months with a markedly increased risk of pneumonia, blood clots and bedsores. Patient will be transferred by [facility]  to [place] for admission to the hospitalist service this evening  , J 05/22/2015, 5:40 PM      

## 2015-05-23 NOTE — Clinical Social Work Note (Signed)
CSW received consult for SNF placement, will wait for PT recommendations to begin bed search process.  CSW continuing to follow patient's progress.  Jones Broom. [place], MSW, [place] 05/23/2015 7:01 PM

## 2015-05-23 NOTE — Anesthesia Preprocedure Evaluation (Addendum)
Anesthesia Evaluation  Patient identified by MRN, date of birth, ID band Patient awake    Reviewed: Allergy & Precautions, NPO status , Patient's Chart, lab work & pertinent test results  Airway Mallampati: II  TM Distance: >3 FB Neck ROM: Full    Dental  (+) Teeth Intact, Dental Advisory Given, Implants   Pulmonary neg pulmonary ROS,    Pulmonary exam normal breath sounds clear to auscultation       Cardiovascular Exercise Tolerance: Good (-) hypertension(-) angina(-) CAD and (-) Past MI Normal cardiovascular exam Rhythm:Regular Rate:Normal  2014 Echo: Study Conclusions  - Left ventricle: The cavity size was normal. Systolic function was normal. The estimated ejection fraction was in the range of 55% to 60%. Wall motion was normal; therewere no regional wall motion abnormalities. - Atrial septum: Redundant and mobile atrial septum with noobvious ASD or PFO - Pulmonary arteries: PA peak pressure: 63mm Hg (S).    Neuro/Psych  Neuromuscular disease    GI/Hepatic Neg liver ROS, GERD  Medicated,  Endo/Other  negative endocrine ROS  Renal/GU negative Renal ROS     Musculoskeletal negative musculoskeletal ROS (+)   Abdominal   Peds  Hematology negative hematology ROS (+)   Anesthesia Other Findings Day of surgery medications reviewed with the patient.  Reproductive/Obstetrics negative OB ROS                            Anesthesia Physical Anesthesia Plan  ASA: II  Anesthesia Plan: General   Post-op Pain Management:    Induction: Intravenous  Airway Management Planned: Oral ETT  Additional Equipment:   Intra-op Plan:   Post-operative Plan: Extubation in OR  Informed Consent: I have reviewed the patients History and Physical, chart, labs and discussed the procedure including the risks, benefits and alternatives for the proposed anesthesia with the patient or authorized  representative who has indicated his/her understanding and acceptance.   Dental advisory given  Plan Discussed with: CRNA  Anesthesia Plan Comments: (Risks/benefits of general anesthesia discussed with patient including risk of damage to teeth, lips, gum, and tongue, nausea/vomiting, allergic reactions to medications, and the possibility of heart attack, stroke and death.  All patient questions answered.  Patient wishes to proceed.)        Anesthesia Quick Evaluation

## 2015-05-23 NOTE — Progress Notes (Addendum)
Initial Nutrition Assessment  DOCUMENTATION CODES:   Not applicable  INTERVENTION:  Once diet advances, provide Ensure Enlive po BID, each supplement provides 350 kcal and 20 grams of protein.  Recommend obtaining new weight to fully assess weight trends.   NUTRITION DIAGNOSIS:   Increased nutrient needs related to  (s/p surgery) as evidenced by estimated needs.  GOAL:   Patient will meet greater than or equal to 90% of their needs  MONITOR:   Diet advancement, Weight trends, Supplement acceptance, Labs, I & O's, Skin  REASON FOR ASSESSMENT:   Consult Assessment of nutrition requirement/status  ASSESSMENT:   82 year old female with history of lumbar compression fracture, GERD with Barrett's esophagitis, osteoporosis who presented to the ED after sustaining a fall while she was walking her dog and her neighbors dog jumped on her. Patient lost her balance and fell on her right side. She had severe pain in her right hip. Patient sustained a small laceration in her right arm but denies sustaining any other injury, loss of consciousness or any head injury. Extremities the hip foreshortened femoral neck with slight rotation of the femoral head. CT of the hip was done to further confirm and showed fracture at the base of the cervical neck of the right femur along with involvement of the intertrochanteric area.  Pt is currently NPO for surgery today. Unable to obtain nutrition history or perform nutrition focused physical exam as pt has been in the OR. Noted, no new weight recorded for this admission. Recommend obtaining new weight to fully assess weight trends. RD to order Ensure to aid in healing once diet advanced.   RD to perform nutrition focused physical exam during next visit.   Labs and medications reviewed.   Diet Order:  Diet NPO time specified Except for: Sips with Meds  Skin:   (Incision on R hip, R elbow skin tear)  Last BM:  1/12  Height:   Ht Readings from Last 1  Encounters:  08/14/13 5\' 1"  (1.549 m)    Weight:   Wt Readings from Last 1 Encounters:  08/14/13 97 lb (43.999 kg)    Ideal Body Weight:  47.7 kg  BMI:  There is no weight on file to calculate BMI.  Estimated Nutritional Needs:   Kcal:  1300-1500  Protein:  55-65 grams  Fluid:  >/= 1.5 L/day  EDUCATION NEEDS:   No education needs identified at this time  Corrin Parker, MS, RD, LDN Pager # [phone] After hours/ weekend pager # [phone]

## 2015-05-23 NOTE — Progress Notes (Signed)
PROGRESS NOTE    Katrina Burnett [id number] DOB: 09/13/1924 DOA: 05/22/2015 PCP: Jani Gravel, MD  HPI/Brief narrative 82 year old female patient with history of lumbar compression fracture, GERD, Barrett's esophagus, osteoporosis, presented to [facility] ED on 05/22/15 following a fall while she was walking her dog and her neighbor's dog jumped on her. She complained of severe right hip pain. Imaging confirmed right femoral neck fracture. Orthopedics was consulted and recommended transferring to [facility] for surgery on 1/13.   Assessment/Plan:   Right hip intertrochanteric fracture - Sustained status post mechanical fall - Orthopedics was consulted and advised transfer from [facility] to [facility] for surgery - Patient underwent ORIF on 1/13 - Management per orthopedics (weightbearing, anticoagulation, DVT prophylaxis and wound management)  GERD/Barrett's esophagus - Continue PPI. Pepcid added due to worsening reflux symptoms.    DVT prophylaxis: Lovenox Code Status: Full Family Communication: Discussed with patient's son at bedside prior to surgery this morning. Disposition Plan: Likely to SNF when medically stable.   Consultants:  Orthopedics  Procedures:  ORIF right hip 1/13  Antimicrobials:  None   Subjective: Patient was seen prior to surgery. Complained of severe right hip pain. Was in traction this morning.  Objective: Filed Vitals:   05/22/15 1806 05/22/15 2057 05/22/15 2203 05/23/15 0518  BP: 120/73 153/93 141/73 121/80  Pulse: 84 74 85 88  Temp:   98.8 F (37.1 C) 98 F (36.7 C)  TempSrc:   Oral Oral  Resp: 18 18 19 20   SpO2: 92% 90% 90% 98%    Intake/Output Summary (Last 24 hours) at 05/23/15 0708 Last data filed at 05/23/15 0519  Gross per 24 hour  Intake      0 ml  Output    300 ml  Net   -300 ml   There were no vitals filed for this visit.  Exam:  General exam: Small built and frail elderly female lying uncomfortably in bed and in painful distress. Respiratory  system: Clear. No increased work of breathing. Cardiovascular system: S1 & S2 heard, RRR. No JVD, murmurs, gallops, clicks or pedal edema. Gastrointestinal system: Abdomen is nondistended, soft and nontender. Normal bowel sounds heard. Central nervous system: Alert and oriented. No focal neurological deficits. Extremities: Symmetric 5 x 5 power. Right lower extremity in traction this morning.   Data Reviewed: Basic Metabolic Panel:  Recent Labs Lab 05/22/15 1512 05/23/15 0001  NA 136  --   K 4.3  --   CL 104  --   CO2 21*  --   GLUCOSE 87  --   BUN 23*  --   CREATININE 0.85 0.83  CALCIUM 9.2  --    Liver Function Tests: No results for input(s): AST, ALT, ALKPHOS, BILITOT, PROT, ALBUMIN in the last 168 hours. No results for input(s): LIPASE, AMYLASE in the last 168 hours. No results for input(s): AMMONIA in the last 168 hours. CBC:  Recent Labs Lab 05/22/15 1512 05/23/15 0001  WBC 9.8 12.7*  NEUTROABS 7.7  --   HGB 12.4 12.8  HCT 39.2 39.3  MCV 92.0 90.6  PLT 292 282   Cardiac Enzymes: No results for input(s): CKTOTAL, CKMB, CKMBINDEX, TROPONINI in the last 168 hours. BNP (last 3 results) No results for input(s): PROBNP in the last 8760 hours. CBG: No results for input(s): GLUCAP in the last 168 hours.  Recent Results (from the past 240 hour(s))  Surgical pcr screen     Status: Abnormal   Collection Time: 05/23/15 12:31 AM  Result Value  Ref Range Status   MRSA, PCR NEGATIVE NEGATIVE Final   Staphylococcus aureus POSITIVE (A) NEGATIVE Final    Comment:        The Xpert SA Assay (FDA approved for NASAL specimens in patients over 54 years of age), is one component of a comprehensive surveillance program.  Test performance has been validated by [facility] for patients greater than or equal to 81 year old. It is not intended to diagnose infection nor to guide or monitor treatment.          Studies: Ct Hip Right Wo Contrast  05/22/2015  CLINICAL  DATA:  [address] today and injured right hip. Abnormal hip x-rays. EXAM: CT OF THE RIGHT HIP WITHOUT CONTRAST TECHNIQUE: Multidetector CT imaging of the right hip was performed according to the standard protocol. Multiplanar CT image reconstructions were also generated. COMPARISON:  Radiographs 05/22/2015. FINDINGS: Mildly impacted and slightly displaced fracture at the base of the femoral neck and involving the upper aspect of the intertrochanteric region of the hip. Mild degenerative changes involving the hip. No evidence of AVN. Chondrocalcinosis is noted. The visualized right hemipelvis is intact. Moderate degenerative changes at the pubic symphysis. No acetabular fracture. IMPRESSION: Fracture at the base of the cervical neck also involving the intertrochanteric region of the femur. Electronically Signed   By: Marijo Sanes M.D.   On: 05/22/2015 16:20   Chest Portable 1 View  05/22/2015  CLINICAL DATA:  Preoperative for repair of right hip fracture. EXAM: PORTABLE CHEST 1 VIEW COMPARISON:  02/25/2014 chest radiograph. FINDINGS: Stable cardiomediastinal silhouette with normal heart size. No pneumothorax. No pleural effusion. Stable minimal scarring versus atelectasis at the lateral left lung base. No acute consolidative airspace disease. No pulmonary edema. IMPRESSION: Stable minimal scarring versus atelectasis at the lateral left lung base. Otherwise no active cardiopulmonary disease. Electronically Signed   By: Ilona Sorrel M.D.   On: 05/22/2015 22:00   Dg Hip Unilat With Pelvis 2-3 Views Right  05/22/2015  CLINICAL DATA:  Fall walking the dog today, right hip pain EXAM: DG HIP (WITH OR WITHOUT PELVIS) 2-3V RIGHT COMPARISON:  12/17/2011 FINDINGS: Four views of the right hip submitted. Study is limited by diffuse osteopenia. There is shortened appearance of the femoral neck with slight rotation of femoral head. Findings are highly suspicious for impacted fracture of the right femoral neck. Further correlation  with CT scan or MRI is recommended. IMPRESSION: There is shortened appearance of the femoral neck with slight rotation of femoral head. Findings are highly suspicious for impacted fracture of the right femoral neck. Further correlation with CT scan or MRI is recommended. These results were called by telephone at the time of interpretation on 05/22/2015 at 3:46 pm to Dr. [name] , who verbally acknowledged these results. Electronically Signed   By: Lahoma Crocker M.D.   On: 05/22/2015 15:47        Scheduled Meds: . acidophilus  1 capsule Oral Daily  . azelastine  1 spray Each Nare Daily  . Chlorhexidine Gluconate Cloth  6 each Topical Daily  . cholecalciferol  1,000 Units Oral Daily  . citalopram  20 mg Oral Daily  . dorzolamide  1 drop Both Eyes BID  . enoxaparin (LOVENOX) injection  40 mg Subcutaneous Q24H  . famotidine  10 mg Oral BID  . fluticasone  1 spray Each Nare Daily  . latanoprost  1 drop Both Eyes QHS  . loratadine  10 mg Oral Daily  . LORazepam  1 mg  Oral BID  . multivitamin with minerals  1 tablet Oral Daily  . mupirocin ointment  1 application Nasal BID  . pantoprazole  40 mg Oral Daily   Continuous Infusions: . sodium chloride 50 mL/hr at 05/22/15 2314    Active Problems:   Esophageal reflux   Fracture of femoral neck, right, closed   Femoral neck fracture, right, closed, initial encounter    Time spent: 30 minutes.    Vernell Leep, MD, FACP, FHM. [facility] Pager [phone]  If 7PM-7AM, please contact night-coverage [email or web address] Password [email or web address] 05/23/2015, 7:08 AM    LOS: 1 day

## 2015-05-23 NOTE — Anesthesia Procedure Notes (Signed)
Procedure Name: Intubation Date/Time: 05/23/2015 12:08 PM Performed by: Rogers Blocker Pre-anesthesia Checklist: Patient identified, Timeout performed, Emergency Drugs available, Suction available and Patient being monitored Patient Re-evaluated:Patient Re-evaluated prior to inductionOxygen Delivery Method: Circle system utilized Preoxygenation: Pre-oxygenation with 100% oxygen Intubation Type: IV induction Ventilation: Mask ventilation without difficulty Laryngoscope Size: Miller and 2 Grade View: Grade I Tube type: Oral Tube size: 7.0 mm Number of attempts: 1 Airway Equipment and Method: Stylet Placement Confirmation: ETT inserted through vocal cords under direct vision,  breath sounds checked- equal and bilateral,  positive ETCO2 and CO2 detector Secured at: 21 cm Tube secured with: Tape Dental Injury: Teeth and Oropharynx as per pre-operative assessment

## 2015-05-23 NOTE — Op Note (Signed)
DATE OF PROCEDURE: 12/16/2011  PREOPERATIVE DIAGNOSIS: R hip intertrochanteric fracture 2-part with varus displacement  POSTOPERATIVE DIAGNOSIS: Same  PROCEDURE: Open reduction internal fixation left hip intertrochanteric fracture using a DePuy TK 2 4 hole 135 short barrel sideplate, 80 mm lag screw keyed  SURGEON: Elsa Ploch J  ASSISTANT: Eric K. Barton Dubois  (present throughout entire procedure and necessary for timely completion of the procedure) ANESTHESIA: General  BLOOD LOSS: 300 cc  FLUID REPLACEMENT: 1200 cc crystalloid  DRAINS: Foley Catheter  URINE OUTPUT: 0000000  COMPLICATIONS: none   INDICATIONS FOR PROCEDURE: R hip intertrochanteric fracture, 2-part, sustained from a fal. Patient. presented to the emergency room, was admitted by the medicine service and orthopedic consultation was obtained. To decrease pain and increase function we have recommended open reduction internal fixation using a dynamic hip screw. The risks, benefits, and alternatives were discussed at length including but not limited to the risks of infection, bleeding, nerve injury, stiffness, blood clots, the need for revision surgery, cardiopulmonary complications, among others, and they were willing to proceed. Benefits have been discussed. Questions answered.   PROCEDURE IN DETAIL: The patient was identified by armband,  received preoperative IV antibiotics in the holding area, taken to the operating room , appropriate anesthetic monitors were attached and general endotracheal anesthesia induced. Pt. was then transferred to a radiolucent flat Jackson table, rolled into the L lateral decubitus position and fixed there with a Stulberg Mark 2 pelvic clamp. Under C-arm imaging control we then performed a closed reduction with abduction and internal rotation obtaining a near-anatomic reduction with the leg in full extension. The lateral aspect of the hip and thigh was then prepped and draped in usual sterile fashion in the  iliac crest to the knee. A timeout procedure was performed. A 10 centimeter incision starting out at the flare of the greater trochanter and going distally was made along the lateral thigh through the skin and subcutaneous tissue down to the level of the IT band which was then cut in line with the skin incision exposing the vastus lateralis. This was likewise split taking [place] down to the flare of the greater trochanter and down the lateral side of the femur for about 8-10 cm. Under C-arm image control we then placed a guide pin at at 135 angle to the lateral flare of the greater trochanter up the femoral neck and into the center of the femoral head on the AP and lateral views. This measured at 80 mm and the triple reamer was set at 80 mm. Bone quality was actually quite good during the reaming. We then used to tap to prepare the drill hole for an 80 mm lag screw, which was placed without difficulty over the guide pin. We then selected a 135 4-hole sideplate placed it over the lag screw and fixed it to the lateral femur with 4 bicortical 4.5 mm screws. C-arm images were taken confirming a near-anatomic reduction. The wound was then thoroughly irrigated with normal saline solution. The vastus lateralis was closed with running 0 Vicryl suture, the IT band with running #1 Vicryl suture, the subcutaneous tissue with 0 and 2-0 undyed Vicryl suture and the skin with 3-0 vicryl. A dressing of Mepilex was then applied the patient was unclamped rolled supine awakened extubated and taken to the recovery room without difficulty.  Frederik Pear J  12/16/2011, 7:29 PM

## 2015-05-23 NOTE — Discharge Instructions (Addendum)
Hip Fracture A hip fracture is a fracture of the upper part of your thigh bone (femur).  CAUSES A hip fracture is caused by a direct blow to the side of your hip. This is usually the result of a fall but can occur in other circumstances, such as an automobile accident. RISK FACTORS There is an increased risk of hip fractures in people with:  An unsteady walking pattern (gait) and those with conditions that contribute to poor balance, such as Parkinson's disease or dementia.  Osteopenia and osteoporosis.  Cancer that spreads to the leg bones.  Certain metabolic diseases. SYMPTOMS  Symptoms of hip fracture include:  Pain over the injured hip.  Inability to put weight on the leg in which the fracture occurred (although, some patients are able to walk after a hip fracture).  Toes and foot of the affected leg point outward when you lie down. DIAGNOSIS A physical exam can determine if a hip fracture is likely to have occurred. X-ray exams are needed to confirm the fracture and to look for other injuries. The X-ray exam can help to determine the type of hip fracture. Rarely, the fracture is not visible on an X-ray image and a CT scan or MRI will have to be done. TREATMENT  The treatment for a fracture is usually surgery. This means using a screw, nail, or rod to hold the bones in place.  HOME CARE INSTRUCTIONS Take all medicines as directed by your health care provider. 50% PWB with walker. SEEK MEDICAL CARE IF: Pain continues, even after taking pain medicine. MAKE SURE YOU:  Understand these instructions.   Will watch your condition.  Will get help right away if you are not doing well or get worse.   This information is not intended to replace advice given to you by your health care provider. Make sure you discuss any questions you have with your health care provider.   Document Released: 04/26/2005 Document Revised: 05/01/2013 Document Reviewed: 12/06/2012 Elsevier Interactive  Patient Education [employer].

## 2015-05-23 NOTE — Progress Notes (Signed)
Utilization review completed.  

## 2015-05-23 NOTE — Transfer of Care (Signed)
Immediate Anesthesia Transfer of Care Note  Patient: Katrina Burnett  Procedure(s) Performed: Procedure(s): OPEN REDUCTION INTERNAL FIXATION HIP (Right)  Patient Location: PACU  Anesthesia Type:General  Level of Consciousness: awake, alert , oriented and patient cooperative  Airway & Oxygen Therapy: Patient Spontanous Breathing and Patient connected to nasal cannula oxygen  Post-op Assessment: Report given to RN, Post -op Vital signs reviewed and stable and Patient moving all extremities X 4  Post vital signs: Reviewed and stable  Last Vitals:  Filed Vitals:   05/23/15 0518 05/23/15 0931  BP: 121/80 139/75  Pulse: 88 90  Temp: 36.7 C 36.5 C  Resp: 20 20    Complications: No apparent anesthesia complications

## 2015-05-23 NOTE — Interval H&P Note (Signed)
History and Physical Interval Note:  05/23/2015 11:21 AM  Katrina Burnett  has presented today for surgery, with the diagnosis of Right Hip Intertroch Fracture  The various methods of treatment have been discussed with the patient and family. After consideration of risks, benefits and other options for treatment, the patient has consented to  Procedure(s): OPEN REDUCTION INTERNAL FIXATION HIP (Right) as a surgical intervention .  The patient's history has been reviewed, patient examined, no change in status, stable for surgery.  I have reviewed the patient's chart and labs.  Questions were answered to the patient's satisfaction.     Kerin Salen

## 2015-05-23 NOTE — Anesthesia Postprocedure Evaluation (Signed)
Anesthesia Post Note  Patient: Katrina Burnett  Procedure(s) Performed: Procedure(s) (LRB): OPEN REDUCTION INTERNAL FIXATION HIP (Right)  Patient location during evaluation: PACU Anesthesia Type: General Level of consciousness: awake and alert Pain management: pain level controlled Vital Signs Assessment: post-procedure vital signs reviewed and stable Respiratory status: spontaneous breathing, nonlabored ventilation, respiratory function stable and patient connected to nasal cannula oxygen Cardiovascular status: blood pressure returned to baseline and stable Postop Assessment: no signs of nausea or vomiting Anesthetic complications: no    Last Vitals:  Filed Vitals:   05/23/15 1545 05/23/15 1604  BP: 98/63 117/51  Pulse: 93 97  Temp:  36.4 C  Resp: 15 16    Last Pain:  Filed Vitals:   05/23/15 1605  PainSc: 0-No pain                 Catalina Gravel

## 2015-05-24 DIAGNOSIS — D62 Acute posthemorrhagic anemia: Secondary | ICD-10-CM

## 2015-05-24 LAB — CBC
HEMATOCRIT: 32.1 % — AB (ref 36.0–46.0)
HEMOGLOBIN: 10.3 g/dL — AB (ref 12.0–15.0)
MCH: 29.8 pg (ref 26.0–34.0)
MCHC: 32.1 g/dL (ref 30.0–36.0)
MCV: 92.8 fL (ref 78.0–100.0)
Platelets: 216 10*3/uL (ref 150–400)
RBC: 3.46 MIL/uL — ABNORMAL LOW (ref 3.87–5.11)
RDW: 13.3 % (ref 11.5–15.5)
WBC: 9 10*3/uL (ref 4.0–10.5)

## 2015-05-24 LAB — BASIC METABOLIC PANEL
ANION GAP: 4 — AB (ref 5–15)
BUN: 11 mg/dL (ref 6–20)
CHLORIDE: 104 mmol/L (ref 101–111)
CO2: 28 mmol/L (ref 22–32)
Calcium: 8.4 mg/dL — ABNORMAL LOW (ref 8.9–10.3)
Creatinine, Ser: 0.74 mg/dL (ref 0.44–1.00)
GFR calc non Af Amer: >60 mL/min (ref >60)
Glucose, Bld: 94 mg/dL (ref 65–99)
Potassium: 3.8 mmol/L (ref 3.5–5.1)
Sodium: 136 mmol/L (ref 135–145)

## 2015-05-24 NOTE — Progress Notes (Signed)
PROGRESS NOTE    Katrina Burnett [id number] DOB: 01/28/1925 DOA: 05/22/2015 PCP: Jani Gravel, MD  HPI/Brief narrative 82 year old female patient with history of lumbar compression fracture, GERD, Barrett's esophagus, osteoporosis, presented to [facility] ED on 05/22/15 following a fall while she was walking her dog and her neighbor's dog jumped on her. She complained of severe right hip pain. Imaging confirmed right femoral neck fracture. Orthopedics was consulted and recommended transferring to [facility] for surgery on 1/13.   Assessment/Plan:   Right hip intertrochanteric fracture - Sustained status post mechanical fall - Orthopedics was consulted and advised transfer from [facility] to [facility] for surgery - Patient underwent ORIF on 1/13 - Management per orthopedics (weightbearing-50% weightbearing, DVT prophylaxis-ASA 325 MG twice a day and wound management)  GERD/Barrett's esophagus - Continue PPI. Pepcid added due to worsening reflux symptoms.  Acute blood loss anemia - Secondary to surgery. Follow CBC in a.m. and transfuse PRBC. Hemoglobin less than 7 g per DL.   DVT prophylaxis: Lovenox Code Status: Full Family Communication: None at bedside. Disposition Plan: To be determined pending therapy evaluation.   Consultants:  Orthopedics  Procedures:  ORIF right hip 1/13  Foley catheter.  Antimicrobials:  None   Subjective: Feels better. Right hip pain better than preop but now with appropriate postoperative pain. Denies any other complaints. No acute events reported per RN.  Objective: Filed Vitals:   05/23/15 1604 05/23/15 2023 05/24/15 0115 05/24/15 0411  BP: 117/51 114/62 110/60 94/51  Pulse: 97 86 83 81  Temp: 97.5 F (36.4 C) 99.8 F (37.7 C) 98.7 F (37.1 C) 98 F (36.7 C)  TempSrc: Oral Oral Oral Oral  Resp: 16 16 16 16   SpO2: 97% 95% 98% 98%    Intake/Output Summary (Last 24 hours) at 05/24/15 1402 Last data filed at 05/24/15 1339  Gross per 24 hour  Intake    510  ml  Output    600 ml  Net    -90 ml   There were no vitals filed for this visit.  Exam:  General exam: Small built and frail elderly female lying comfortably supine in bed. Appears better than she did on 1/13 before surgery. Respiratory system: Clear. No increased work of breathing. Cardiovascular system: S1 & S2 heard, RRR. No JVD, murmurs, gallops, clicks or pedal edema. Gastrointestinal system: Abdomen is nondistended, soft and nontender. Normal bowel sounds heard. Central nervous system: Alert and oriented. No focal neurological deficits. Extremities: Symmetric 5 x 5 power. Right hip surgical site dressing clean and dry.   Data Reviewed: Basic Metabolic Panel:  Recent Labs Lab 05/22/15 1512 05/23/15 0001 05/24/15 0603  NA 136  --  136  K 4.3  --  3.8  CL 104  --  104  CO2 21*  --  28  GLUCOSE 87  --  94  BUN 23*  --  11  CREATININE 0.85 0.83 0.74  CALCIUM 9.2  --  8.4*   Liver Function Tests: No results for input(s): AST, ALT, ALKPHOS, BILITOT, PROT, ALBUMIN in the last 168 hours. No results for input(s): LIPASE, AMYLASE in the last 168 hours. No results for input(s): AMMONIA in the last 168 hours. CBC:  Recent Labs Lab 05/22/15 1512 05/23/15 0001 05/24/15 0603  WBC 9.8 12.7* 9.0  NEUTROABS 7.7  --   --   HGB 12.4 12.8 10.3*  HCT 39.2 39.3 32.1*  MCV 92.0 90.6 92.8  PLT 292 282 216   Cardiac Enzymes: No results for input(s): CKTOTAL,  CKMB, CKMBINDEX, TROPONINI in the last 168 hours. BNP (last 3 results) No results for input(s): PROBNP in the last 8760 hours. CBG: No results for input(s): GLUCAP in the last 168 hours.  Recent Results (from the past 240 hour(s))  Surgical pcr screen     Status: Abnormal   Collection Time: 05/23/15 12:31 AM  Result Value Ref Range Status   MRSA, PCR NEGATIVE NEGATIVE Final   Staphylococcus aureus POSITIVE (A) NEGATIVE Final    Comment:        The Xpert SA Assay (FDA approved for NASAL specimens in patients over 51  years of age), is one component of a comprehensive surveillance program.  Test performance has been validated by [facility] for patients greater than or equal to 43 year old. It is not intended to diagnose infection nor to guide or monitor treatment.          Studies: Pelvis Portable  05/23/2015  CLINICAL DATA:  Femur fracture on the right, closed. Initial encounter. EXAM: PORTABLE PELVIS 1-2 VIEWS COMPARISON:  CT from yesterday FINDINGS: Status post open reduction and internal fixation of a proximal right femur fracture with dynamic hip screw. Alignment is anatomic in the frontal projection. Both hips are located. The pelvic ring is intact. IMPRESSION: No adverse finding after right hip fracture ORIF. Electronically Signed   By: Monte Fantasia M.D.   On: 05/23/2015 17:35   Ct Hip Right Wo Contrast  05/22/2015  CLINICAL DATA:  [place] [address] today and injured right hip. Abnormal hip x-rays. EXAM: CT OF THE RIGHT HIP WITHOUT CONTRAST TECHNIQUE: Multidetector CT imaging of the right hip was performed according to the standard protocol. Multiplanar CT image reconstructions were also generated. COMPARISON:  Radiographs 05/22/2015. FINDINGS: Mildly impacted and slightly displaced fracture at the base of the femoral neck and involving the upper aspect of the intertrochanteric region of the hip. Mild degenerative changes involving the hip. No evidence of AVN. Chondrocalcinosis is noted. The visualized right hemipelvis is intact. Moderate degenerative changes at the pubic symphysis. No acetabular fracture. IMPRESSION: Fracture at the base of the cervical neck also involving the intertrochanteric region of the femur. Electronically Signed   By: Marijo Sanes M.D.   On: 05/22/2015 16:20   Chest Portable 1 View  05/22/2015  CLINICAL DATA:  Preoperative for repair of right hip fracture. EXAM: PORTABLE CHEST 1 VIEW COMPARISON:  02/25/2014 chest radiograph. FINDINGS: Stable cardiomediastinal silhouette with normal  heart size. No pneumothorax. No pleural effusion. Stable minimal scarring versus atelectasis at the lateral left lung base. No acute consolidative airspace disease. No pulmonary edema. IMPRESSION: Stable minimal scarring versus atelectasis at the lateral left lung base. Otherwise no active cardiopulmonary disease. Electronically Signed   By: Ilona Sorrel M.D.   On: 05/22/2015 22:00   Dg Hip Operative Unilat With Pelvis Right  05/23/2015  CLINICAL DATA:  Elective surgery. EXAM: OPERATIVE right HIP (WITH PELVIS IF PERFORMED) 2 VIEWS TECHNIQUE: Fluoroscopic spot image(s) were submitted for interpretation post-operatively. COMPARISON:  Radiography from yesterday FINDINGS: Fluoroscopy shows open reduction internal fixation of an intertrochanteric right femur fracture. No evidence of intraoperative fracture. No dislocation. IMPRESSION: Fluoroscopy for right femur fracture ORIF. Electronically Signed   By: Monte Fantasia M.D.   On: 05/23/2015 13:27   Dg Hip Unilat With Pelvis 2-3 Views Right  05/22/2015  CLINICAL DATA:  Fall walking the dog today, right hip pain EXAM: DG HIP (WITH OR WITHOUT PELVIS) 2-3V RIGHT COMPARISON:  12/17/2011 FINDINGS: Four views of the right hip  submitted. Study is limited by diffuse osteopenia. There is shortened appearance of the femoral neck with slight rotation of femoral head. Findings are highly suspicious for impacted fracture of the right femoral neck. Further correlation with CT scan or MRI is recommended. IMPRESSION: There is shortened appearance of the femoral neck with slight rotation of femoral head. Findings are highly suspicious for impacted fracture of the right femoral neck. Further correlation with CT scan or MRI is recommended. These results were called by telephone at the time of interpretation on 05/22/2015 at 3:46 pm to Dr. [name] , who verbally acknowledged these results. Electronically Signed   By: Lahoma Crocker M.D.   On: 05/22/2015 15:47         Scheduled Meds: . azelastine  1 spray Each Nare Daily  . citalopram  20 mg Oral Daily  . dorzolamide  1 drop Both Eyes BID  . enoxaparin (LOVENOX) injection  40 mg Subcutaneous Q24H  . famotidine  10 mg Oral BID  . feeding supplement (ENSURE ENLIVE)  237 mL Oral BID BM  . fluticasone  1 spray Each Nare Daily  . latanoprost  1 drop Both Eyes QHS  . loratadine  10 mg Oral Daily  . LORazepam  1 mg Oral BID  . mupirocin ointment  1 application Nasal BID  . pantoprazole  40 mg Oral Daily   Continuous Infusions: . dextrose 5 % and 0.45 % NaCl with KCl 20 mEq/L 100 mL/hr at 05/24/15 0949  . lactated ringers      Active Problems:   Esophageal reflux   Fracture of femoral neck, right, closed   Femoral neck fracture, right, closed, initial encounter   Femur fracture, right, closed, initial encounter    Time spent: 20 minutes.    Vernell Leep, MD, FACP, FHM. [facility] Pager [phone]  If 7PM-7AM, please contact night-coverage [email or web address] Password [email or web address] 05/24/2015, 2:02 PM    LOS: 2 days

## 2015-05-24 NOTE — Evaluation (Signed)
Physical Therapy Evaluation Patient Details Name: Katrina Burnett MRN: [id number] DOB: 1925-04-01 Today's Date: 05/24/2015   History of Present Illness  Pt is an 82 y/o female who presents s/p L intertrochanteric hip fracture.   Clinical Impression  Pt admitted with above diagnosis. Pt currently with functional limitations due to the deficits listed below (see PT Problem List). At the time of PT eval pt was able to perform transfers with +2 assist for support and safety. PTA, pt was very active and independent. Feel this patient would thrive in the CIR setting as she is motivated to participate with therapy and get back to her independent lifestyle. Pt will benefit from skilled PT to increase their independence and safety with mobility to allow discharge to the venue listed below.       Follow Up Recommendations CIR;Supervision for mobility/OOB    Equipment Recommendations  3in1 (PT)    Recommendations for Other Services       Precautions / Restrictions Precautions Precautions: Fall Restrictions Weight Bearing Restrictions: Yes RLE Weight Bearing: Partial weight bearing RLE Partial Weight Bearing Percentage or Pounds: 50%      Mobility  Bed Mobility Overal bed mobility: Needs Assistance;+2 for physical assistance Bed Mobility: Supine to Sit     Supine to sit: Min assist;+2 for physical assistance     General bed mobility comments: VC's for sequencing and technique as pt transitioned to EOB. Bed pad used for scooting and assist provided for RLE movement.   Transfers Overall transfer level: Needs assistance Equipment used: Rolling walker (2 wheeled) Transfers: Sit to/from [facility] Sit to Stand: Max assist;+2 safety/equipment Stand pivot transfers: Min assist;+2 physical assistance;+2 safety/equipment       General transfer comment: +1 max assist to power-up to full standing position. Pt was cued for PWB status as she took pivotal steps around to the  recliner. +2 utilized for pt support and walker management as she transferred to the chair.   Ambulation/Gait             General Gait Details: Unable at this time due to pain  Stairs            Wheelchair Mobility    Modified Rankin (Stroke Patients Only)       Balance Overall balance assessment: Needs assistance Sitting-balance support: Feet supported;Bilateral upper extremity supported Sitting balance-Leahy Scale: Poor Sitting balance - Comments: Poor mainly due to pain and guarding of anterior hip   Standing balance support: Bilateral upper extremity supported;During functional activity Standing balance-Leahy Scale: Poor                               Pertinent Vitals/Pain Pain Assessment: Faces Faces Pain Scale: Hurts whole lot Pain Location: R hip Pain Descriptors / Indicators: Operative site guarding;Aching Pain Intervention(s): Limited activity within patient's tolerance;Monitored during session;Repositioned    Home Living Family/patient expects to be discharged to:: Private residence Living Arrangements: Children Available Help at Discharge: Family;Available 24 hours/day Type of Home: House Home Access: Stairs to enter Entrance Stairs-Rails: Right;Left;Can reach both Entrance Stairs-Number of Steps: 5 Home Layout: One level Home Equipment: Walker - 2 wheels      Prior Function Level of Independence: Independent         Comments: Very active at home     Hand Dominance        Extremity/Trunk Assessment   Upper Extremity Assessment: Defer to OT evaluation  Lower Extremity Assessment: RLE deficits/detail RLE Deficits / Details: Decreased strength, AROM and acute pain consistent with recent surgery.     Cervical / Trunk Assessment: Kyphotic  Communication   Communication: No difficulties  Cognition Arousal/Alertness: Awake/alert Behavior During Therapy: WFL for tasks assessed/performed Overall Cognitive  Status: Within Functional Limits for tasks assessed                      General Comments      Exercises General Exercises - Lower Extremity Ankle Circles/Pumps: 10 reps Quad Sets: 10 reps Long Arc Quad: 10 reps      Assessment/Plan    PT Assessment Patient needs continued PT services  PT Diagnosis Difficulty walking;Acute pain   PT Problem List Decreased strength;Decreased range of motion;Decreased activity tolerance;Decreased balance;Decreased mobility;Decreased safety awareness;Decreased knowledge of use of DME;Decreased knowledge of precautions;Pain  PT Treatment Interventions DME instruction;Gait training;Stair training;Functional mobility training;Therapeutic activities;Therapeutic exercise;Neuromuscular re-education;Patient/family education   PT Goals (Current goals can be found in the Care Plan section) Acute Rehab PT Goals Patient Stated Goal: Be able to go home PT Goal Formulation: With patient Time For Goal Achievement: 06/07/15 Potential to Achieve Goals: Good    Frequency Min 3X/week   Barriers to discharge        Co-evaluation               End of Session Equipment Utilized During Treatment: Gait belt Activity Tolerance: Patient limited by pain Patient left: in chair;with chair alarm set;with call bell/phone within reach Nurse Communication: Mobility status         Time: 1351-1417 PT Time Calculation (min) (ACUTE ONLY): 26 min   Charges:   PT Evaluation $PT Eval Moderate Complexity: 1 Procedure PT Treatments $Gait Training: 8-22 mins   PT G Codes:        Rolinda Roan 06-09-15, 2:40 PM   Rolinda Roan, PT, DPT Acute Rehabilitation Services Pager: [phone]

## 2015-05-24 NOTE — Progress Notes (Addendum)
    Patient doing well, had some difficulty over night with pain control and stated she was not receiving her Lorazepam as she takes at home, was given sleeping aid and then able to rest, feeling better this morning. Moderate R hip pain. Denies N/V/C, +passing gas and eating/drinking.   Physical Exam: BP 94/51 mmHg  Pulse 81  Temp(Src) 98 F (36.7 C) (Oral)  Resp 16  SpO2 98% Dressing in place, clean and dry, minimal surrounding ecchymosis, distal compartments soft and NT, SCD's in place NVI  CBC Latest Ref Rng 05/24/2015 05/23/2015 05/22/2015  WBC 4.0 - 10.5 K/uL 9.0 12.7(H) 9.8  Hemoglobin 12.0 - 15.0 g/dL 10.3(L) 12.8 12.4  Hematocrit 36.0 - 46.0 % 32.1(L) 39.3 39.2  Platelets 150 - 400 K/uL 216 282 292   CMP Latest Ref Rng 05/24/2015 05/23/2015 05/22/2015  Glucose 65 - 99 mg/dL 94 - 87  BUN 6 - 20 mg/dL 11 - 23(H)  Creatinine 0.44 - 1.00 mg/dL 0.74 0.83 0.85  Sodium 135 - 145 mmol/L 136 - 136  Potassium 3.5 - 5.1 mmol/L 3.8 - 4.3  Chloride 101 - 111 mmol/L 104 - 104  CO2 22 - 32 mmol/L 28 - 21(L)  Calcium 8.9 - 10.3 mg/dL 8.4(L) - 9.2  Total Protein 6.0 - 8.3 g/dL - - -  Total Bilirubin 0.3 - 1.2 mg/dL - - -  Alkaline Phos 39 - 117 U/L - - -  AST 0 - 37 U/L - - -  ALT 0 - 35 U/L - - -    POD #1 s/p R Hip ORIF  - up with PT/OT, 50% WB, encourage ambulation - Meds through primary team, px well controlled at this venture - likely d/c home vs to SNF pending PT/OT progress, pt would like to go home with [facility] - ASA 325 BID

## 2015-05-24 NOTE — Evaluation (Signed)
Occupational Therapy Evaluation Patient Details Name: Katrina Burnett MRN: [id number] DOB: February 05, 1925 Today's Date: 05/24/2015    History of Present Illness Pt is an 82 y/o female who presents s/p L intertrochanteric hip fracture.    Clinical Impression   Pt reports she was independent with ADLs and mobility PTA. OT eval limited by drop in SpO2 and lightheadedness/dizziness with sit to stand. SpO2 down to high 50s in standing; up to high 90s when returned to sitting with initiation of deep breathing; sporadic drops to low 80s when talking with therapist. Pt on 2.5 L O2 via nasal canula throughout (RN and RN tech aware). Feel pt would benefit from CIR level therapies as follow up to maximize independence and safety with ADLs and functional mobility prior to return home; pt and family are agreeable to post acute rehab. Pt would benefit from continued skilled OT to increase independence and safety with LB ADLs, toilet and tub transfers while maintaining PWB status on RLE.     Follow Up Recommendations  CIR    Equipment Recommendations  3 in 1 bedside comode;Tub/shower seat;Other (comment) (AE)    Recommendations for Other Services Rehab consult     Precautions / Restrictions Precautions Precautions: Fall Restrictions Weight Bearing Restrictions: Yes RLE Weight Bearing: Partial weight bearing RLE Partial Weight Bearing Percentage or Pounds: 50%      Mobility Bed Mobility Overal bed mobility: Needs Assistance;+2 for physical assistance Bed Mobility: Supine to Sit     Supine to sit: Min assist;+2 for physical assistance     General bed mobility comments: Pt OOB in chair  Transfers Overall transfer level: Needs assistance Equipment used: Rolling walker (2 wheeled) Transfers: Sit to/from Stand Sit to Stand: Min assist Stand pivot transfers: Min assist;+2 physical assistance;+2 safety/equipment       General transfer comment: Min assist for sit to stand from chair. Decreased  SpO2 with sit to stand.    Balance Overall balance assessment: Needs assistance Sitting-balance support: Feet supported;Bilateral upper extremity supported Sitting balance-Leahy Scale: Poor Sitting balance - Comments: Poor mainly due to pain and guarding of anterior hip   Standing balance support: Bilateral upper extremity supported Standing balance-Leahy Scale: Poor Standing balance comment: RW for support                            ADL Overall ADL's : Needs assistance/impaired Eating/Feeding: Set up;Sitting   Grooming: Set up;Sitting       Lower Body Bathing: Minimal assistance;Sit to/from stand       Lower Body Dressing: Minimal assistance;Sit to/from stand   Toilet Transfer: Minimal assistance;Stand-pivot;BSC;RW           Functional mobility during ADLs: Minimal assistance;Rolling walker (for stand pivot transfer ) General ADL Comments: Family present but stepped out for OT eval. Educated pt on safety with RW, precautions, WB status; pt verbalized understanding. Discussed potential for post acute rehab; pt agreeable. Eval limited by drop in SpO2 and pt with lightheadness/dizziness upon standing. SpO2 dropped to high 50s with sit to stand, deep breathing techniques initiated and SpO2 up to 99 in sitting. Occasional SpO2 drops to low 80s when talking with therapist but back up to high 90s with deep breathing, Pt on 2.5 L O2 throughout.     Vision     Perception     Praxis      Pertinent Vitals/Pain Pain Assessment: 0-10 Pain Score: 8  Faces Pain Scale: Hurts whole lot Pain  Location: R hip Pain Descriptors / Indicators: Aching;Grimacing;Operative site guarding Pain Intervention(s): Limited activity within patient's tolerance;Monitored during session     Hand Dominance     Extremity/Trunk Assessment Upper Extremity Assessment Upper Extremity Assessment: Generalized weakness   Lower Extremity Assessment Lower Extremity Assessment: Defer to PT  evaluation RLE Deficits / Details: Decreased strength, AROM and acute pain consistent with recent surgery.  RLE: Unable to fully assess due to pain RLE Coordination: decreased fine motor;decreased gross motor   Cervical / Trunk Assessment Cervical / Trunk Assessment: Kyphotic   Communication Communication Communication: No difficulties   Cognition Arousal/Alertness: Awake/alert Behavior During Therapy: WFL for tasks assessed/performed Overall Cognitive Status: Within Functional Limits for tasks assessed                     General Comments       Exercises Exercises: General Lower Extremity     Shoulder Instructions      Home Living Family/patient expects to be discharged to:: Private residence Living Arrangements: Children Available Help at Discharge: Family;Available 24 hours/day Type of Home: House Home Access: Stairs to enter [employer] of Steps: 5 Entrance Stairs-Rails: Right;Left;Can reach both Home Layout: One level     Bathroom Shower/Tub: Occupational psychologist: Standard Bathroom Accessibility: Yes How Accessible: Accessible via walker Home Equipment: [place] - 2 wheels;Shower seat - built in;Cane - single point          Prior Functioning/Environment Level of Independence: Independent        Comments: Very active at home    OT Diagnosis: Generalized weakness;Acute pain   OT Problem List: Decreased strength;Decreased activity tolerance;Impaired balance (sitting and/or standing);Decreased knowledge of use of DME or AE;Decreased knowledge of precautions;Pain   OT Treatment/Interventions: Self-care/ADL training;Energy conservation;DME and/or AE instruction;Therapeutic activities;Patient/family education    OT Goals(Current goals can be found in the care plan section) Acute Rehab OT Goals Patient Stated Goal: Be able to go home OT Goal Formulation: With patient Time For Goal Achievement: 06/07/15 Potential to Achieve  Goals: Good ADL Goals Pt Will Perform Grooming: with supervision;standing Pt Will Perform Lower Body Bathing: with supervision;sit to/from stand (with or without AE) Pt Will Perform Lower Body Dressing: with supervision;sit to/from stand (with or without AE) Pt Will Transfer to Toilet: with supervision;ambulating;bedside commode (BSC over toilet) Pt Will Perform Toileting - Clothing Manipulation and hygiene: with supervision;sit to/from stand Pt Will Perform Tub/Shower Transfer: Shower transfer;with supervision;ambulating;shower seat;rolling walker  OT Frequency: Min 2X/week   Barriers to D/C:            Co-evaluation              End of Session Equipment Utilized During Treatment: Gait belt;Rolling walker;Oxygen Nurse Communication: Other (comment) (SpO2 down to high 50s with sit to stand (RN tech/RN))  Activity Tolerance: Other (comment) (Limited by SpO2, lightheaded/dizzy) Patient left: in chair;with call bell/phone within reach;with chair alarm set;with family/visitor present   Time: 1534-1600 OT Time Calculation (min): 26 min Charges:  OT General Charges $OT Visit: 1 Procedure OT Evaluation $OT Eval Moderate Complexity: 1 Procedure OT Treatments $Therapeutic Activity: 8-22 mins G-Codes:     Binnie Kand M.S., OTR/L Pager: [phone]  05/24/2015, 4:16 PM

## 2015-05-25 LAB — CBC
HCT: 33.5 % — ABNORMAL LOW (ref 36.0–46.0)
Hemoglobin: 10.8 g/dL — ABNORMAL LOW (ref 12.0–15.0)
MCH: 29.5 pg (ref 26.0–34.0)
MCHC: 32.2 g/dL (ref 30.0–36.0)
MCV: 91.5 fL (ref 78.0–100.0)
PLATELETS: 231 10*3/uL (ref 150–400)
RBC: 3.66 MIL/uL — ABNORMAL LOW (ref 3.87–5.11)
RDW: 13.2 % (ref 11.5–15.5)
WBC: 9 10*3/uL (ref 4.0–10.5)

## 2015-05-25 LAB — BASIC METABOLIC PANEL
Anion gap: 7 (ref 5–15)
BUN: 8 mg/dL (ref 6–20)
CALCIUM: 9.1 mg/dL (ref 8.9–10.3)
CO2: 27 mmol/L (ref 22–32)
CREATININE: 0.79 mg/dL (ref 0.44–1.00)
Chloride: 103 mmol/L (ref 101–111)
GFR calc non Af Amer: >60 mL/min (ref >60)
Glucose, Bld: 95 mg/dL (ref 65–99)
Potassium: 4.3 mmol/L (ref 3.5–5.1)
SODIUM: 137 mmol/L (ref 135–145)

## 2015-05-25 MED ORDER — ALUM & MAG HYDROXIDE-SIMETH 200-200-20 MG/5ML PO SUSP
30.0000 mL | Freq: Once | ORAL | Status: AC
  Administered 2015-05-25: 30 mL via ORAL
  Filled 2015-05-25: qty 30

## 2015-05-25 NOTE — Clinical Social Work Note (Signed)
Clinical Social Work Assessment  Patient Details  Name: Katrina Burnett MRN: [id number] Date of Birth: December 29, 1924  Date of referral:  05/25/15               Reason for consult:  Facility Placement                Permission sought to share information with:  Family Supports Permission granted to share information::  Yes, Verbal Permission Granted  Name::     [name]  Agency::     Relationship::  son  Contact Information:     Housing/Transportation Living arrangements for the past 2 months:  Single Family Home Source of Information:  Patient Patient Interpreter Needed:  None Criminal Activity/Legal Involvement Pertinent to Current Situation/Hospitalization:  No - Comment as needed Significant Relationships:  Adult Children Lives with:  Adult Children, Pets Do you feel safe going back to the place where you live?  Yes Need for family participation in patient care:  No (Coment)  Care giving concerns:  none   Facilities manager / plan:  Pt stated that she will go home if CIR is not an option.  Pt stated that her son lives with her and she feels that she would be just fine to return home with [facility], should CIR not be an option.  Employment status:  Retired Health visitor PT Recommendations:  Inpatient [place] / Referral to community resources:     Patient/Family's Response to care:  Pt was fine with the care she's receiving in the hospital.  Patient/Family's Understanding of and Emotional Response to Diagnosis, Current Treatment, and Prognosis:  Pt understands that she needs some rehab after the hospitalization but stated that, if CIR isn't an option, she thinks she will do fine with PT/OT in her home.  Emotional Assessment Appearance:  Appears stated age Attitude/Demeanor/Rapport:   (cooperative) Affect (typically observed):  Accepting Orientation:  Oriented to Self, Oriented to Place, Oriented to  Time, Oriented to Situation Alcohol /  Substance use:  Never Used Psych involvement (Current and /or in the community):  No (Comment)  Discharge Needs  Concerns to be addressed:  No discharge needs identified Readmission within the last 30 days:  No Current discharge risk:  None Barriers to Discharge:  No Barriers Identified   Matilde Bash, [place] 05/25/2015, 11:29 AM

## 2015-05-25 NOTE — Progress Notes (Signed)
    Patient doing well, slept much better last night, has not yet had px medicine today and reports increased hip pain as a result, being given meds now. Passing urine, no BM but passing gas, pt reports not eating much. Up with PT yesterday mild progress, pending rehab stay potentially. Daughter at bedside.    Physical Exam: BP 128/62 mmHg  Pulse 75  Temp(Src) 98.7 F (37.1 C) (Oral)  Resp 18  SpO2 100%  CBC Latest Ref Rng 05/25/2015 05/24/2015 05/23/2015  WBC 4.0 - 10.5 K/uL 9.0 9.0 12.7(H)  Hemoglobin 12.0 - 15.0 g/dL 10.8(L) 10.3(L) 12.8  Hematocrit 36.0 - 46.0 % 33.5(L) 32.1(L) 39.3  Platelets 150 - 400 K/uL 231 216 282    CMP Latest Ref Rng 05/25/2015 05/24/2015 05/23/2015  Glucose 65 - 99 mg/dL 95 94 -  BUN 6 - 20 mg/dL 8 11 -  Creatinine 0.44 - 1.00 mg/dL 0.79 0.74 0.83  Sodium 135 - 145 mmol/L 137 136 -  Potassium 3.5 - 5.1 mmol/L 4.3 3.8 -  Chloride 101 - 111 mmol/L 103 104 -  CO2 22 - 32 mmol/L 27 28 -  Calcium 8.9 - 10.3 mg/dL 9.1 8.4(L) -  Total Protein 6.0 - 8.3 g/dL - - -  Total Bilirubin 0.3 - 1.2 mg/dL - - -  Alkaline Phos 39 - 117 U/L - - -  AST 0 - 37 U/L - - -  ALT 0 - 35 U/L - - -    Dressing in place, mild ecchymosis about, pt was sitting at bedside and then transferred back to lying down. Distal compartments soft. 2+ DPP, NVI  POD #2 s/p R Hip ORIF  - up with PT/OT, 50% WB, encourage ambulation  -Pending PT today still - Meds through primary team, well controlled with meds - likely d/c rehab vs to SNF pending PT/OT progress  -Daughter at bedside, pt lives alone and needs to be independent prior to returning home - ASA 325 BID

## 2015-05-25 NOTE — Progress Notes (Signed)
PROGRESS NOTE    Katrina Burnett [id number] DOB: 23-Apr-1925 DOA: 05/22/2015 PCP: Jani Gravel, MD  HPI/Brief narrative 82 year old female patient with history of lumbar compression fracture, GERD, Barrett's esophagus, osteoporosis, presented to [facility] ED on 05/22/15 following a fall while she was walking her dog and her neighbor's dog jumped on her. She complained of severe right hip pain. Imaging confirmed right femoral neck fracture. Orthopedics was consulted and recommended transferring to [facility] for surgery on 1/13. S/P ORIF 1/13. Awaiting CIR consult.   Assessment/Plan:   Right hip intertrochanteric fracture - Sustained status post mechanical fall - Orthopedics was consulted and advised transfer [facility] to [facility] for surgery - Patient underwent ORIF on 1/13 - Management per orthopedics (weightbearing-50% weightbearing, DVT prophylaxis-ASA 325 MG twice a day and wound management) - Per therapies, consulted CIR.  GERD/Barrett's esophagus - Continue PPI. Pepcid added due to worsening reflux symptoms.  Acute blood loss anemia - Secondary to surgery. Stable.   DVT prophylaxis: Lovenox Code Status: Full Family Communication: None at bedside. Disposition Plan: pending CIR consultation   Consultants:  Orthopedics  Procedures:  ORIF right hip 1/13  Foley catheter - DC'ed.  Antimicrobials:  None   Subjective: Appropriate post op R hip pain. Hoping to go home but agreeable to rehab prior.  Objective: Filed Vitals:   05/24/15 1427 05/24/15 2103 05/25/15 0400 05/25/15 0729  BP: 101/51 116/51 125/58 128/62  Pulse: 91 94 77 75  Temp: 98.9 F (37.2 C) 100.3 F (37.9 C) 98.8 F (37.1 C) 98.7 F (37.1 C)  TempSrc: Oral Oral    Resp: 16 18 18 18   SpO2: 95% 95% 100% 100%    Intake/Output Summary (Last 24 hours) at 05/25/15 1313 Last data filed at 05/24/15 2300  Gross per 24 hour  Intake    420 ml  Output    350 ml  Net     70 ml   There were no vitals filed for this  visit.  Exam:  General exam: Small built and frail elderly female lying comfortably supine in bed.  Respiratory system: Clear. No increased work of breathing. Cardiovascular system: S1 & S2 heard, RRR. No JVD, murmurs, gallops, clicks or pedal edema. Gastrointestinal system: Abdomen is nondistended, soft and nontender. Normal bowel sounds heard. Central nervous system: Alert and oriented. No focal neurological deficits. Extremities: Symmetric 5 x 5 power. Right hip surgical site dressing clean and dry.   Data Reviewed: Basic Metabolic Panel:  Recent Labs Lab 05/22/15 1512 05/23/15 0001 05/24/15 0603 05/25/15 0420  NA 136  --  136 137  K 4.3  --  3.8 4.3  CL 104  --  104 103  CO2 21*  --  28 27  GLUCOSE 87  --  94 95  BUN 23*  --  11 8  CREATININE 0.85 0.83 0.74 0.79  CALCIUM 9.2  --  8.4* 9.1   Liver Function Tests: No results for input(s): AST, ALT, ALKPHOS, BILITOT, PROT, ALBUMIN in the last 168 hours. No results for input(s): LIPASE, AMYLASE in the last 168 hours. No results for input(s): AMMONIA in the last 168 hours. CBC:  Recent Labs Lab 05/22/15 1512 05/23/15 0001 05/24/15 0603 05/25/15 0420  WBC 9.8 12.7* 9.0 9.0  NEUTROABS 7.7  --   --   --   HGB 12.4 12.8 10.3* 10.8*  HCT 39.2 39.3 32.1* 33.5*  MCV 92.0 90.6 92.8 91.5  PLT 292 282 216 231   Cardiac Enzymes: No results for input(s):  CKTOTAL, CKMB, CKMBINDEX, TROPONINI in the last 168 hours. BNP (last 3 results) No results for input(s): PROBNP in the last 8760 hours. CBG: No results for input(s): GLUCAP in the last 168 hours.  Recent Results (from the past 240 hour(s))  Surgical pcr screen     Status: Abnormal   Collection Time: 05/23/15 12:31 AM  Result Value Ref Range Status   MRSA, PCR NEGATIVE NEGATIVE Final   Staphylococcus aureus POSITIVE (A) NEGATIVE Final    Comment:        The Xpert SA Assay (FDA approved for NASAL specimens in patients over 27 years of age), is one component of a  comprehensive surveillance program.  Test performance has been validated by [facility] for patients greater than or equal to 19 year old. It is not intended to diagnose infection nor to guide or monitor treatment.          Studies: Pelvis Portable  05/23/2015  CLINICAL DATA:  Femur fracture on the right, closed. Initial encounter. EXAM: PORTABLE PELVIS 1-2 VIEWS COMPARISON:  CT from yesterday FINDINGS: Status post open reduction and internal fixation of a proximal right femur fracture with dynamic hip screw. Alignment is anatomic in the frontal projection. Both hips are located. The pelvic ring is intact. IMPRESSION: No adverse finding after right hip fracture ORIF. Electronically Signed   By: Monte Fantasia M.D.   On: 05/23/2015 17:35   Dg Hip Operative Unilat With Pelvis Right  05/23/2015  CLINICAL DATA:  Elective surgery. EXAM: OPERATIVE right HIP (WITH PELVIS IF PERFORMED) 2 VIEWS TECHNIQUE: Fluoroscopic spot image(s) were submitted for interpretation post-operatively. COMPARISON:  Radiography from yesterday FINDINGS: Fluoroscopy shows open reduction internal fixation of an intertrochanteric right femur fracture. No evidence of intraoperative fracture. No dislocation. IMPRESSION: Fluoroscopy for right femur fracture ORIF. Electronically Signed   By: Monte Fantasia M.D.   On: 05/23/2015 13:27        Scheduled Meds: . azelastine  1 spray Each Nare Daily  . citalopram  20 mg Oral Daily  . dorzolamide  1 drop Both Eyes BID  . enoxaparin (LOVENOX) injection  40 mg Subcutaneous Q24H  . famotidine  10 mg Oral BID  . feeding supplement (ENSURE ENLIVE)  237 mL Oral BID BM  . fluticasone  1 spray Each Nare Daily  . latanoprost  1 drop Both Eyes QHS  . loratadine  10 mg Oral Daily  . LORazepam  1 mg Oral BID  . mupirocin ointment  1 application Nasal BID  . pantoprazole  40 mg Oral Daily   Continuous Infusions: . dextrose 5 % and 0.45 % NaCl with KCl 20 mEq/L 100 mL/hr at  05/24/15 0949  . lactated ringers      Active Problems:   Esophageal reflux   Fracture of femoral neck, right, closed   Femoral neck fracture, right, closed, initial encounter   Femur fracture, right, closed, initial encounter    Time spent: 15 minutes.    Vernell Leep, MD, FACP, FHM. [facility] Pager [phone]  If 7PM-7AM, please contact night-coverage [email or web address] Password [email or web address] 05/25/2015, 1:13 PM    LOS: 3 days

## 2015-05-26 ENCOUNTER — Inpatient Hospital Stay (HOSPITAL_COMMUNITY)
Admission: AD | Admit: 2015-05-26 | Discharge: 2015-06-04 | DRG: 560 | Disposition: A | Discharge status: Home Health | Payer: Medicare Other | Source: Intra-hospital | Attending: Physical Medicine & Rehabilitation | Admitting: Physical Medicine & Rehabilitation

## 2015-05-26 ENCOUNTER — Encounter (HOSPITAL_COMMUNITY): Payer: Self-pay | Admitting: Orthopedic Surgery

## 2015-05-26 DIAGNOSIS — S41111D Laceration without foreign body of right upper arm, subsequent encounter: Secondary | ICD-10-CM

## 2015-05-26 DIAGNOSIS — S72001A Fracture of unspecified part of neck of right femur, initial encounter for closed fracture: Secondary | ICD-10-CM

## 2015-05-26 DIAGNOSIS — Z880 Allergy status to penicillin: Secondary | ICD-10-CM | POA: Diagnosis not present

## 2015-05-26 DIAGNOSIS — R609 Edema, unspecified: Secondary | ICD-10-CM

## 2015-05-26 DIAGNOSIS — R269 Unspecified abnormalities of gait and mobility: Secondary | ICD-10-CM | POA: Insufficient documentation

## 2015-05-26 DIAGNOSIS — D62 Acute posthemorrhagic anemia: Secondary | ICD-10-CM | POA: Diagnosis present

## 2015-05-26 DIAGNOSIS — H409 Unspecified glaucoma: Secondary | ICD-10-CM | POA: Diagnosis present

## 2015-05-26 DIAGNOSIS — Z888 Allergy status to other drugs, medicaments and biological substances status: Secondary | ICD-10-CM | POA: Diagnosis not present

## 2015-05-26 DIAGNOSIS — M810 Age-related osteoporosis without current pathological fracture: Secondary | ICD-10-CM | POA: Diagnosis present

## 2015-05-26 DIAGNOSIS — W19XXXS Unspecified fall, sequela: Secondary | ICD-10-CM | POA: Diagnosis not present

## 2015-05-26 DIAGNOSIS — G8918 Other acute postprocedural pain: Secondary | ICD-10-CM | POA: Insufficient documentation

## 2015-05-26 DIAGNOSIS — S72141A Displaced intertrochanteric fracture of right femur, initial encounter for closed fracture: Secondary | ICD-10-CM | POA: Diagnosis present

## 2015-05-26 DIAGNOSIS — Z79899 Other long term (current) drug therapy: Secondary | ICD-10-CM

## 2015-05-26 DIAGNOSIS — W19XXXA Unspecified fall, initial encounter: Secondary | ICD-10-CM | POA: Diagnosis present

## 2015-05-26 DIAGNOSIS — Z882 Allergy status to sulfonamides status: Secondary | ICD-10-CM | POA: Diagnosis not present

## 2015-05-26 DIAGNOSIS — S72141S Displaced intertrochanteric fracture of right femur, sequela: Secondary | ICD-10-CM | POA: Diagnosis not present

## 2015-05-26 DIAGNOSIS — F4323 Adjustment disorder with mixed anxiety and depressed mood: Secondary | ICD-10-CM

## 2015-05-26 DIAGNOSIS — S72141D Displaced intertrochanteric fracture of right femur, subsequent encounter for closed fracture with routine healing: Secondary | ICD-10-CM | POA: Diagnosis not present

## 2015-05-26 DIAGNOSIS — Z881 Allergy status to other antibiotic agents status: Secondary | ICD-10-CM | POA: Diagnosis not present

## 2015-05-26 DIAGNOSIS — F419 Anxiety disorder, unspecified: Secondary | ICD-10-CM | POA: Diagnosis present

## 2015-05-26 DIAGNOSIS — S7290XA Unspecified fracture of unspecified femur, initial encounter for closed fracture: Secondary | ICD-10-CM | POA: Insufficient documentation

## 2015-05-26 DIAGNOSIS — K5900 Constipation, unspecified: Secondary | ICD-10-CM | POA: Diagnosis present

## 2015-05-26 DIAGNOSIS — K219 Gastro-esophageal reflux disease without esophagitis: Secondary | ICD-10-CM | POA: Diagnosis present

## 2015-05-26 DIAGNOSIS — M353 Polymyalgia rheumatica: Secondary | ICD-10-CM

## 2015-05-26 DIAGNOSIS — F411 Generalized anxiety disorder: Secondary | ICD-10-CM | POA: Diagnosis not present

## 2015-05-26 LAB — CBC
HCT: 30.5 % — ABNORMAL LOW (ref 36.0–46.0)
Hemoglobin: 9.6 g/dL — ABNORMAL LOW (ref 12.0–15.0)
MCH: 28.4 pg (ref 26.0–34.0)
MCHC: 31.5 g/dL (ref 30.0–36.0)
MCV: 90.2 fL (ref 78.0–100.0)
PLATELETS: 255 10*3/uL (ref 150–400)
RBC: 3.38 MIL/uL — ABNORMAL LOW (ref 3.87–5.11)
RDW: 13.3 % (ref 11.5–15.5)
WBC: 7.8 10*3/uL (ref 4.0–10.5)

## 2015-05-26 MED ORDER — ENOXAPARIN SODIUM 40 MG/0.4ML ~~LOC~~ SOLN: 40.0000 mg | SUBCUTANEOUS | Status: DC

## 2015-05-26 MED ORDER — ONDANSETRON HCL 4 MG/2ML IJ SOLN: 4.0000 mg | Freq: Four times a day (QID) | INTRAMUSCULAR | Status: DC | PRN

## 2015-05-26 MED ORDER — ONDANSETRON HCL 4 MG PO TABS: 4.0000 mg | ORAL_TABLET | Freq: Four times a day (QID) | ORAL | Status: DC | PRN

## 2015-05-26 MED ORDER — CITALOPRAM HYDROBROMIDE 20 MG PO TABS
20.0000 mg | ORAL_TABLET | Freq: Every day | ORAL | Status: DC
  Administered 2015-05-27 – 2015-06-04 (×9): 20 mg via ORAL
  Filled 2015-05-26 (×9): qty 1

## 2015-05-26 MED ORDER — LORATADINE 10 MG PO TABS
10.0000 mg | ORAL_TABLET | Freq: Every day | ORAL | Status: DC
  Administered 2015-05-27 – 2015-06-04 (×9): 10 mg via ORAL
  Filled 2015-05-26 (×10): qty 1

## 2015-05-26 MED ORDER — HYDROCODONE-ACETAMINOPHEN 5-325 MG PO TABS
1.0000 | ORAL_TABLET | Freq: Four times a day (QID) | ORAL | Status: DC | PRN
  Administered 2015-05-26: 1 via ORAL
  Administered 2015-05-27 (×2): 2 via ORAL
  Administered 2015-05-27: 1 via ORAL
  Administered 2015-05-28: 2 via ORAL
  Administered 2015-05-28: 1 via ORAL
  Administered 2015-05-28 – 2015-05-30 (×6): 2 via ORAL
  Administered 2015-05-30 (×3): 1 via ORAL
  Administered 2015-05-31: 2 via ORAL
  Administered 2015-05-31: 1 via ORAL
  Administered 2015-05-31 – 2015-06-01 (×2): 2 via ORAL
  Administered 2015-06-01 – 2015-06-02 (×3): 1 via ORAL
  Filled 2015-05-26: qty 2
  Filled 2015-05-26 (×3): qty 1
  Filled 2015-05-26 (×5): qty 2
  Filled 2015-05-26: qty 1
  Filled 2015-05-26 (×4): qty 2
  Filled 2015-05-26: qty 1
  Filled 2015-05-26 (×4): qty 2
  Filled 2015-05-26 (×3): qty 1

## 2015-05-26 MED ORDER — DORZOLAMIDE HCL 2 % OP SOLN
1.0000 [drp] | Freq: Two times a day (BID) | OPHTHALMIC | Status: DC
Start: 2015-05-26 — End: 2015-06-04
  Administered 2015-05-26 – 2015-06-04 (×18): 1 [drp] via OPHTHALMIC
  Filled 2015-05-26: qty 10

## 2015-05-26 MED ORDER — FLUTICASONE PROPIONATE 50 MCG/ACT NA SUSP
1.0000 | Freq: Every day | NASAL | Status: DC
  Administered 2015-05-28 – 2015-06-04 (×8): 1 via NASAL
  Filled 2015-05-26: qty 16

## 2015-05-26 MED ORDER — LATANOPROST 0.005 % OP SOLN
1.0000 [drp] | Freq: Every day | OPHTHALMIC | Status: DC
  Administered 2015-05-26 – 2015-06-03 (×9): 1 [drp] via OPHTHALMIC
  Filled 2015-05-26: qty 2.5

## 2015-05-26 MED ORDER — ENOXAPARIN SODIUM 40 MG/0.4ML ~~LOC~~ SOLN
40.0000 mg | SUBCUTANEOUS | Status: DC
Start: 2015-05-26 — End: 2015-06-04
  Administered 2015-05-26 – 2015-06-03 (×9): 40 mg via SUBCUTANEOUS
  Filled 2015-05-26 (×9): qty 0.4

## 2015-05-26 MED ORDER — ENSURE ENLIVE PO LIQD
237.0000 mL | Freq: Two times a day (BID) | ORAL | Status: DC
  Administered 2015-05-27 – 2015-06-04 (×8): 237 mL via ORAL

## 2015-05-26 MED ORDER — METHOCARBAMOL 500 MG PO TABS
500.0000 mg | ORAL_TABLET | Freq: Four times a day (QID) | ORAL | Status: DC | PRN
  Administered 2015-05-27 – 2015-05-30 (×10): 500 mg via ORAL
  Filled 2015-05-26 (×10): qty 1

## 2015-05-26 MED ORDER — PANTOPRAZOLE SODIUM 40 MG PO TBEC
40.0000 mg | DELAYED_RELEASE_TABLET | Freq: Every day | ORAL | Status: DC
  Administered 2015-05-27 – 2015-06-04 (×9): 40 mg via ORAL
  Filled 2015-05-26 (×9): qty 1

## 2015-05-26 MED ORDER — SENNOSIDES-DOCUSATE SODIUM 8.6-50 MG PO TABS
1.0000 | ORAL_TABLET | Freq: Every evening | ORAL | Status: DC | PRN
  Filled 2015-05-26: qty 1

## 2015-05-26 MED ORDER — LORAZEPAM 1 MG PO TABS
1.0000 mg | ORAL_TABLET | Freq: Two times a day (BID) | ORAL | Status: DC
  Administered 2015-05-26 – 2015-06-04 (×18): 1 mg via ORAL
  Filled 2015-05-26 (×18): qty 1

## 2015-05-26 MED ORDER — FAMOTIDINE 10 MG PO TABS
10.0000 mg | ORAL_TABLET | Freq: Two times a day (BID) | ORAL | Status: DC
  Administered 2015-05-26 – 2015-06-04 (×18): 10 mg via ORAL
  Filled 2015-05-26 (×18): qty 1

## 2015-05-26 MED ORDER — SORBITOL 70 % SOLN
30.0000 mL | Freq: Every day | Status: DC | PRN
  Administered 2015-06-01: 30 mL via ORAL
  Filled 2015-05-26: qty 30

## 2015-05-26 MED ORDER — HYDROCODONE-ACETAMINOPHEN 5-325 MG PO TABS: 1.0000 | ORAL_TABLET | Freq: Four times a day (QID) | ORAL | Status: DC | PRN

## 2015-05-26 MED ORDER — ACETAMINOPHEN 325 MG PO TABS
650.0000 mg | ORAL_TABLET | Freq: Four times a day (QID) | ORAL | Status: DC | PRN
  Administered 2015-05-27 – 2015-06-01 (×3): 650 mg via ORAL
  Filled 2015-05-26 (×4): qty 2

## 2015-05-26 MED ORDER — MUPIROCIN 2 % EX OINT
1.0000 "application " | TOPICAL_OINTMENT | Freq: Two times a day (BID) | CUTANEOUS | Status: AC
  Administered 2015-05-26 – 2015-05-27 (×2): 1 via NASAL
  Filled 2015-05-26: qty 22

## 2015-05-26 MED ORDER — AZELASTINE HCL 0.1 % NA SOLN
1.0000 | Freq: Every day | NASAL | Status: DC
  Administered 2015-05-27 – 2015-06-01 (×5): 1 via NASAL
  Filled 2015-05-26: qty 30

## 2015-05-26 NOTE — Progress Notes (Signed)
PMR Admission Coordinator Pre-Admission Assessment  Patient: Katrina Burnett is an 82 y.o., female MRN: [id number] DOB: 03-31-1925 Height:   Weight:    Insurance Information HMO: PPO: PCP: IPA: 80/20: yes OTHER: no HMO PRIMARY: Medicare a and b Policy#: [id number] Subscriber: pt Benefits: Phone #: online Name: 05/26/15 Eff. Date: 08/09/91 Deduct: $1316 Out of Pocket Max: none Life Max: none CIR: 100% SNF: 20 full days Outpatient: 80% Co-Pay: 20% Home Health: 100% Co-Pay: none DME: 80% Co-Pay: 20% Providers: pt choice  SECONDARY: [employer] Policy#: [id number] Subscriber: pt  Medicaid Application Date: Case Manager:  Disability Application Date: Case Worker:   [employer] [place]    Name Relation Home Work Mobile   Robinson,Susan Daughter [phone]  [phone]   Johnanna Schneiders   [phone]     Current Medical History  Patient Admitting Diagnosis: right intertrochantric hip fracture  History of Present Illness: Katrina Burnett is a 82 y.o. right handed female with history of polymyalgia. Independent prior to admission . One level home with 5 steps to entry. Admitted 05/22/2015 after a fall while walking her dog and neighbors dog knocking her down. She lost her balance and fell on her right side. No loss of consciousness. Sustained a small laceration to the right arm. X-rays and imaging revealed a right hip intertrochanteric fracture 2 part varus displacement. Underwent ORIF 05/23/2015 per Dr. Mayer Camel. 50% weightbearing right lower extremity. Hospital course pain management. Subcutaneous Lovenox for DVT prophylaxis. Acute blood loss anemia 9.6 and monitored.   Past Medical History  Past  Medical History  Diagnosis Date  . Polymyalgia ([place])   . Lumbar compression fracture ([facility])     L1 and L2  . Allergic rhinitis   . Varicose veins   . GERD (gastroesophageal reflux disease)   . Barrett's esophagus 06/1997  . Osteoporosis   . Chondromalacia   . Pneumonia   . Vaginitis   . Diverticulosis   . Tubular adenoma of colon 06/1999  . Glaucoma     Family History  family history includes Colon cancer (age of onset: 44) in her mother; Congestive Heart Failure in her mother.  Prior Rehab/Hospitalizations:  Has the patient had major surgery during 100 days prior to admission? No Pt had previously received SNF rehab after having pneumonia at [facility] in [place]  Current Medications   Current facility-administered medications:  . acetaminophen (TYLENOL) tablet 650 mg, 650 mg, Oral, Q6H PRN **OR** [DISCONTINUED] acetaminophen (TYLENOL) suppository 650 mg, 650 mg, Rectal, Q6H PRN, Leighton Parody, PA-C . azelastine (ASTELIN) 0.1 % nasal spray 1 spray, 1 spray, Each Nare, Daily, Nishant Dhungel, MD, 1 spray at 05/25/15 0927 . citalopram (CELEXA) tablet 20 mg, 20 mg, Oral, Daily, Nishant Dhungel, MD, 20 mg at 05/26/15 1002 . dorzolamide (TRUSOPT) 2 % ophthalmic solution 1 drop, 1 drop, Both Eyes, BID, Nishant Dhungel, MD, 1 drop at 05/26/15 1012 . enoxaparin (LOVENOX) injection 40 mg, 40 mg, Subcutaneous, Q24H, Nishant Dhungel, MD, 40 mg at 05/25/15 2053 . famotidine (PEPCID) tablet 10 mg, 10 mg, Oral, BID, Nishant Dhungel, MD, 10 mg at 05/26/15 1001 . feeding supplement (ENSURE ENLIVE) (ENSURE ENLIVE) liquid 237 mL, 237 mL, Oral, BID BM, Dale [place], RD, 237 mL at 05/26/15 1607 . fluticasone (FLONASE) 50 MCG/ACT nasal spray 1 spray, 1 spray, Each Nare, Daily, Nishant Dhungel, MD, 1 spray at 05/26/15 1011 . HYDROcodone-acetaminophen (NORCO/VICODIN) 5-325 MG per tablet 1-2 tablet, 1-2 tablet, Oral, Q6H PRN, Louellen Molder, MD, 2 tablet  at 05/26/15 1607 . latanoprost (XALATAN) 0.005 % ophthalmic solution 1 drop, 1 drop, Both Eyes, QHS, Nishant Dhungel, MD, 1 drop at 05/25/15 2055 . loratadine (CLARITIN) tablet 10 mg, 10 mg, Oral, Daily, Nishant Dhungel, MD, 10 mg at 05/26/15 1002 . LORazepam (ATIVAN) tablet 1 mg, 1 mg, Oral, BID, Nishant Dhungel, MD, 1 mg at 05/26/15 1002 . menthol-cetylpyridinium (CEPACOL) lozenge 3 mg, 1 lozenge, Oral, PRN, Catalina Gravel, MD . methocarbamol (ROBAXIN) 500 mg in dextrose 5 % 50 mL IVPB, 500 mg, Intravenous, Q6H PRN, Nishant Dhungel, MD, 500 mg at 05/23/15 0256 . morphine 2 MG/ML injection 2 mg, 2 mg, Intravenous, Q2H PRN, Modena Jansky, MD, 2 mg at 05/26/15 1134 . mupirocin ointment (BACTROBAN) 2 % 1 application, 1 application, Nasal, BID, Nishant Dhungel, MD, 1 application at [id number] . ondansetron (ZOFRAN) injection 4 mg, 4 mg, Intravenous, Q6H PRN, Rhetta Mura Schorr, NP, 4 mg at 05/22/15 2218 . pantoprazole (PROTONIX) EC tablet 40 mg, 40 mg, Oral, Daily, Nishant Dhungel, MD, 40 mg at 05/26/15 1002 . phenol (CHLORASEPTIC) mouth spray 1 spray, 1 spray, Mouth/Throat, PRN, Catalina Gravel, MD, 1 spray at 05/23/15 1738 . senna-docusate (Senokot-S) tablet 1 tablet, 1 tablet, Oral, QHS PRN, Louellen Molder, MD, 1 tablet at 05/25/15 2054 . zolpidem (AMBIEN) tablet 5 mg, 5 mg, Oral, QHS PRN, Dianne Dun, NP, 5 mg at 05/24/15 0011  Facility-Administered Medications Ordered in Other Encounters:  . ibandronate (BONIVA) injection 3 mg, 3 mg, Intravenous, Once, Jani Gravel, MD  Patients Current Diet: DIET SOFT Room service appropriate?: Yes; Fluid consistency:: Thin Diet - low sodium heart healthy  Precautions / Restrictions Precautions Precautions: Fall Restrictions Weight Bearing Restrictions: Yes RLE Weight Bearing: Partial weight bearing RLE Partial Weight Bearing Percentage or Pounds: 50%   Has the patient had 2 or more falls or  a fall with injury in the past year?No  Prior Activity Level Community (5-7x/wk): Independent and driving pta. did own adls. [name], son usually cooked. Pt states she typically does own adls, [name] cooks or goes to get food. She drives. And cares for her dog herself.  Home Assistive Devices / Equipment Home Assistive Devices/Equipment: Eyeglasses Home Equipment: Environmental consultant - 2 wheels, Shower seat - built in, [place] - single point  Prior Device Use: Indicate devices/aids used by the patient prior to current illness, exacerbation or injury? None of the above  Prior Functional Level Prior Function Level of Independence: Independent Comments: Very active at home  Self Care: Did the patient need help bathing, dressing, using the toilet or eating? Independent  Indoor Mobility: Did the patient need assistance with walking from room to room (with or without device)? Independent  Stairs: Did the patient need assistance with internal or external stairs (with or without device)? Independent  Functional Cognition: Did the patient need help planning regular tasks such as shopping or remembering to take medications? Independent  Current Functional Level Cognition  Overall Cognitive Status: Within Functional Limits for tasks assessed Orientation Level: Oriented X4   Extremity Assessment (includes Sensation/Coordination)  Upper Extremity Assessment: Generalized weakness  Lower Extremity Assessment: Defer to PT evaluation RLE Deficits / Details: Decreased strength, AROM and acute pain consistent with recent surgery.  RLE: Unable to fully assess due to pain RLE Coordination: decreased fine motor, decreased gross motor    ADLs  Overall ADL's : Needs assistance/impaired Eating/Feeding: Set up, Sitting Grooming: Set up, Sitting Lower Body Bathing: Minimal assistance, Sit to/from stand Lower Body Dressing: Minimal assistance, Sit to/from  stand Toilet Transfer: Minimal assistance, +2 for  safety/equipment, Stand-pivot, BSC, RW, Cueing for sequencing, Cueing for safety Toilet Transfer Details (indicate cue type and reason): Pt required min assist +2 for stand pivot transfer from EOB to chair. Pt reports staff is not coming quickly enough to assist her to [facility] and she is haing to void in bed. Toileting- Clothing Manipulation and Hygiene: Minimal assistance, Sit to/from stand Toileting - Clothing Manipulation Details (indicate cue type and reason): Pt able to complete peri care in standing with min assist for balance in standing. Functional mobility during ADLs: Minimal assistance, +2 for safety/equipment, Rolling walker General ADL Comments: No family present for OT session. Pt in significant pain this session asking for morning meds and pain meds; RN notified. Pt tearful throughout session stating she felt like she couldnt breath. SpO2 96 on RA prior to activity; 93-99 on RA following functional activity/mobility.     Mobility  Overal bed mobility: Needs Assistance Bed Mobility: Supine to Sit Supine to sit: Min assist General bed mobility comments: verbal and tactile cues for sequencing; HOB elevated with use of bedrail; min A for bilat LE to EOB, elevating, trunk, and scooting hips forward    Transfers  Overall transfer level: Needs assistance Equipment used: Rolling walker (2 wheeled) Transfers: Sit to/from Stand, [employer] Transfers Sit to Stand: Min assist, +2 safety/equipment Stand pivot transfers: Min assist, +2 safety/equipment General transfer comment: vc for hand placement, maintaining PWB status, and position of RW and sequencing    Ambulation / Gait / Stairs / Wheelchair Mobility  Ambulation/Gait General Gait Details: Unable at this time due to pain    Posture / Balance Dynamic Sitting Balance Sitting balance - Comments: Poor mainly due to pain and guarding of anterior hip Balance Overall balance assessment: Needs assistance Sitting-balance support:  No upper extremity supported, Feet supported Sitting balance-Leahy Scale: Fair Sitting balance - Comments: Poor mainly due to pain and guarding of anterior hip Standing balance support: Single extremity supported Standing balance-Leahy Scale: Poor Standing balance comment: Pt able to complete peri care in standing with single extremity supported and min assist for balance.    Special needs/care consideration Skin skin tear right elbow. Surgical incision Bowel mgmt: LBM 1/12 and pt complains of constipation. continent Bladder mgmt: continent    Previous Home Environment Living Arrangements: Children, Other (Comment) (son, [name], moved in with her less than a year ago due to hi) Lives With: Other (Comment) (Son, [name], lives with pt due to his issues/needs) Available Help at Discharge: Family, Available 24 hours/day, Other (Comment) (dtr, [name] to provide the 24/7 care at d/c. David unreliable) Type of Home: House Home Layout: One level Home Access: Stairs to enter Entrance Stairs-Rails: Right, Left, Can reach both Entrance Stairs-Number of Steps: 5 Bathroom Shower/Tub: Multimedia programmer: Standard Bathroom Accessibility: Yes How Accessible: Accessible via walker [place]: No Additional Comments: Pt's peekapoo dog is cared for by pt Son, [name], moved in with her less than a year ago due to his financial and social issues. He stays in his room most of the day.  Discharge Living Setting Plans for Discharge Living Setting: Patient's home, Other (Comment) (son, [name], moved in with pt less than a year ago) Type of Home at Discharge: House Discharge Home Layout: One level Discharge Home Access: Stairs to enter Entrance Stairs-Rails: Right, Left, Can reach both Entrance Stairs-Number of Steps: 5 Discharge Bathroom Shower/Tub: Walk-in shower Discharge Bathroom Toilet: Standard Discharge Bathroom Accessibility: Yes How Accessible:  Accessible via walker Does  the patient have any problems obtaining your medications?: No Son, Josph Macho in [place] is POA and dtr, [name] lives in [place]. [name] will be the caregiver 24/7. [name] has social issues and unable to provide the care mentally. Always call [name] for d/c planning needs.[name] will visit daily.  Social/Family/Support Systems Patient Roles: Parent Contact Information: Josph Macho is POA and lives in [place]. Nanda Quinton , will be caregiver Anticipated Caregiver: [name], dtr Anticipated Caregiver's Contact Information: see above Ability/Limitations of Caregiver: no limitations. Dtr lives in [place], but will move in Caregiver Availability: 24/7 Discharge Plan Discussed with Primary Caregiver: Yes Is Caregiver In Agreement with Plan?: Yes Does Caregiver/Family have Issues with Lodging/Transportation while Pt is in Rehab?: No  Goals/Additional Needs Patient/Family Goal for Rehab: supervision PT, supervision to min assist OT Expected length of stay: [facility] 7-10 days Pt/Family Agrees to Admission and willing to participate: Yes Program Orientation Provided & Reviewed with Pt/Caregiver Including Roles & Responsibilities: Yes  Decrease burden of Care through IP rehab admission: n/a  Possible need for SNF placement upon discharge:not anticipated  Patient Condition: This patient's condition remains as documented in the consult dated 05/26/2015, in which the Rehabilitation Physician determined and documented that the patient's condition is appropriate for intensive rehabilitative care in an inpatient rehabilitation facility. Will admit to inpatient rehab today.  Preadmission Screen Completed By: Cleatrice Burke, 05/26/2015 5:00 PM ______________________________________________________________________  Discussed status with Dr. Posey Pronto on 05/26/2015 at 67 and received telephone approval for admission today.  Admission Coordinator: Cleatrice Burke, time 1700 Date 05/26/2015.            Cosigned by: Ankit Lorie Phenix, MD at 05/26/2015 5:32 PM  Revision History     Date/Time User Provider Type Action   05/26/2015 5:32 PM Ankit Lorie Phenix, MD Physician Cosign   05/26/2015 5:01 PM Cristina Gong, RN Rehab Admission Coordinator Sign

## 2015-05-26 NOTE — Discharge Summary (Signed)
Physician Discharge Summary  Katrina Burnett [id number] DOB: [birth year] DOA: 05/22/2015  PCP: Jani Gravel, MD  Admit date: 05/22/2015 Discharge date: 05/26/2015  Time spent: Greater than 30 minutes  Recommendations for Outpatient Follow-up:  1. M.D. at [place]  2. Repeat CBC on 05/27/15. Transfuse PRBCs if hemoglobin <7 g per DL. 3. Dr. Frederik Pear, Orthopedics in 2 weeks. 4. Dr. Jani Gravel, PCP upon discharge from [facility].  Discharge Diagnoses:  Active Problems:   Esophageal reflux   Fracture of femoral neck, right, closed   Femoral neck fracture, right, closed, initial encounter   Femur fracture, right, closed, initial encounter   Closed fracture of femur (HCC)   Post-operative pain   Acute blood loss anemia   Adjustment disorder with mixed anxiety and depressed mood   Falls   Discharge Condition: Improved & Stable  Diet recommendation: Heart healthy diet.  There were no vitals filed for this visit.  History of present illness:  82 year old female patient with history of lumbar compression fracture, GERD, Barrett's esophagus, osteoporosis, presented to [facility] ED on 05/22/15 following a fall while she was walking her dog and her neighbor's dog jumped on her. She complained of severe right hip pain. Imaging confirmed right femoral neck fracture. Orthopedics was consulted and recommended transferring to [facility] for surgery on 1/13. S/P ORIF 1/13.  Hospital Course:   Right hip intertrochanteric fracture - Sustained status post mechanical fall - Orthopedics was consulted and advised transfer from [facility] to [facility] for surgery - Patient underwent ORIF on 1/13 - Management per orthopedics (weightbearing-50% weightbearing, DVT prophylaxis-SCD 72 hours & Lovenox 40 mg subcutaneous daily 2 weeks) - Per therapies, consulted CIR. CIR has evaluated patient and appropriate for their admission today.  GERD/Barrett's esophagus - Continue PPI.   Acute blood loss anemia - Secondary to surgery. Hemoglobin has  slightly dropped from yesterday to 9.6. Follow CBC in a.m. and periodically thereafter and transfuse if hemoglobin <7 g per DL.    Consultants:  Orthopedics  Rehabilitation M.D.  Procedures:  ORIF right hip 1/13  Foley catheter - DC'ed.  Discussed with patient's son at bedside today.   Discharge Exam:  Complaints: Postoperative right hip pain. Moderate to severe in intensity after weightbearing this morning. No other complaints. States that she had a BM and voided urine.  Filed Vitals:   05/25/15 1400 05/25/15 2226 05/26/15 0508 05/26/15 1516  BP: 125/63 110/64 95/61 112/74  Pulse: 101 85 81 84  Temp: 98.4 F (36.9 C) 98.8 F (37.1 C) 98 F (36.7 C) 98 F (36.7 C)  TempSrc:  Oral Oral Oral  Resp: 16 16 16 16   SpO2: 95% 93% 91% 96%     General exam: Small built and frail elderly female lying comfortably supine in bed.  Respiratory system: Clear. No increased work of breathing. Cardiovascular system: S1 & S2 heard, RRR. No JVD, murmurs, gallops, clicks or pedal edema. Gastrointestinal system: Abdomen is nondistended, soft and nontender. Normal bowel sounds heard. Central nervous system: Alert and oriented. No focal neurological deficits. Extremities: Symmetric 5 x 5 power. Right hip surgical site dressing clean and dry.  Discharge Instructions      Discharge Instructions    Call MD for:  difficulty breathing, headache or visual disturbances    Complete by:  As directed      Call MD for:  extreme fatigue    Complete by:  As directed      Call MD for:  persistant dizziness or light-headedness    Complete  by:  As directed      Call MD for:  persistant nausea and vomiting    Complete by:  As directed      Call MD for:  redness, tenderness, or signs of infection (pain, swelling, redness, odor or green/yellow discharge around incision site)    Complete by:  As directed      Call MD for:  severe uncontrolled pain    Complete by:  As directed      Call MD for:   temperature >100.4    Complete by:  As directed      Diet - low sodium heart healthy    Complete by:  As directed      Increase activity slowly    Complete by:  As directed      Partial weight bearing    Complete by:  As directed   % Body Weight:  50  Laterality:  right  Extremity:  Lower            Medication List    TAKE these medications        ALIGN 4 MG Caps  Take 4 mg by mouth daily.     azelastine 0.1 % nasal spray  Commonly known as:  ASTELIN  Place 1 spray into the nose daily. Use in each nostril as directed     cetirizine 10 MG tablet  Commonly known as:  ZYRTEC  Take 10 mg by mouth daily.     cholecalciferol 1000 units tablet  Commonly known as:  VITAMIN D  Take 1,000 Units by mouth daily.     citalopram 40 MG tablet  Commonly known as:  CELEXA  Take 0.5 tablets (20 mg total) by mouth daily.     dorzolamide 2 % ophthalmic solution  Commonly known as:  TRUSOPT  Place 1 drop into both eyes 2 (two) times daily.     enoxaparin 40 MG/0.4ML injection  Commonly known as:  LOVENOX  Inject 0.4 mLs (40 mg total) into the skin daily.     fluconazole 150 MG tablet  Commonly known as:  DIFLUCAN  Take 1 tablet (150 mg total) by mouth once. As needed for yeast     fluticasone 50 MCG/ACT nasal spray  Commonly known as:  FLONASE  Place 1 spray into both nostrils daily.     HYDROcodone-acetaminophen 5-325 MG tablet  Commonly known as:  NORCO  Take 1 tablet by mouth every 6 (six) hours as needed for moderate pain.     ibuprofen 200 MG tablet  Commonly known as:  ADVIL,MOTRIN  Take 200 mg by mouth every 8 (eight) hours as needed. For pain     lansoprazole 30 MG capsule  Commonly known as:  PREVACID  Take 30 mg by mouth daily.     latanoprost 0.005 % ophthalmic solution  Commonly known as:  XALATAN  Place 1 drop into both eyes at bedtime.     LORazepam 1 MG tablet  Commonly known as:  ATIVAN  Take 1 mg by mouth 2 (two) times daily.     methocarbamol 500  MG tablet  Commonly known as:  ROBAXIN  Take 500 mg by mouth 2 (two) times daily as needed. Muscle spasms     multivitamin tablet  Take 1 tablet by mouth daily.       Follow-up Information    Follow up with Kerin Salen, MD In 2 weeks.   Specialty:  Orthopedic Surgery   Contact information:   [phone]  [place] [address] [phone]        The results of significant diagnostics from this hospitalization (including imaging, microbiology, ancillary and laboratory) are listed below for reference.    Significant Diagnostic Studies: Pelvis Portable  05/23/2015  CLINICAL DATA:  Femur fracture on the right, closed. Initial encounter. EXAM: PORTABLE PELVIS 1-2 VIEWS COMPARISON:  CT from yesterday FINDINGS: Status post open reduction and internal fixation of a proximal right femur fracture with dynamic hip screw. Alignment is anatomic in the frontal projection. Both hips are located. The pelvic ring is intact. IMPRESSION: No adverse finding after right hip fracture ORIF. Electronically Signed   By: Monte Fantasia M.D.   On: 05/23/2015 17:35   Ct Hip Right Wo Contrast  05/22/2015  CLINICAL DATA:  [place] today and injured right hip. Abnormal hip x-rays. EXAM: CT OF THE RIGHT HIP WITHOUT CONTRAST TECHNIQUE: Multidetector CT imaging of the right hip was performed according to the standard protocol. Multiplanar CT image reconstructions were also generated. COMPARISON:  Radiographs 05/22/2015. FINDINGS: Mildly impacted and slightly displaced fracture at the base of the femoral neck and involving the upper aspect of the intertrochanteric region of the hip. Mild degenerative changes involving the hip. No evidence of AVN. Chondrocalcinosis is noted. The visualized right hemipelvis is intact. Moderate degenerative changes at the pubic symphysis. No acetabular fracture. IMPRESSION: Fracture at the base of the cervical neck also involving the intertrochanteric region of the femur. Electronically Signed    By: Marijo Sanes M.D.   On: 05/22/2015 16:20   Chest Portable 1 View  05/22/2015  CLINICAL DATA:  Preoperative for repair of right hip fracture. EXAM: PORTABLE CHEST 1 VIEW COMPARISON:  02/25/2014 chest radiograph. FINDINGS: Stable cardiomediastinal silhouette with normal heart size. No pneumothorax. No pleural effusion. Stable minimal scarring versus atelectasis at the lateral left lung base. No acute consolidative airspace disease. No pulmonary edema. IMPRESSION: Stable minimal scarring versus atelectasis at the lateral left lung base. Otherwise no active cardiopulmonary disease. Electronically Signed   By: Ilona Sorrel M.D.   On: 05/22/2015 22:00   Dg Hip Operative Unilat With Pelvis Right  05/23/2015  CLINICAL DATA:  Elective surgery. EXAM: OPERATIVE right HIP (WITH PELVIS IF PERFORMED) 2 VIEWS TECHNIQUE: Fluoroscopic spot image(s) were submitted for interpretation post-operatively. COMPARISON:  Radiography from yesterday FINDINGS: Fluoroscopy shows open reduction internal fixation of an intertrochanteric right femur fracture. No evidence of intraoperative fracture. No dislocation. IMPRESSION: Fluoroscopy for right femur fracture ORIF. Electronically Signed   By: Monte Fantasia M.D.   On: 05/23/2015 13:27   Dg Hip Unilat With Pelvis 2-3 Views Right  05/22/2015  CLINICAL DATA:  Fall walking the dog today, right hip pain EXAM: DG HIP (WITH OR WITHOUT PELVIS) 2-3V RIGHT COMPARISON:  12/17/2011 FINDINGS: Four views of the right hip submitted. Study is limited by diffuse osteopenia. There is shortened appearance of the femoral neck with slight rotation of femoral head. Findings are highly suspicious for impacted fracture of the right femoral neck. Further correlation with CT scan or MRI is recommended. IMPRESSION: There is shortened appearance of the femoral neck with slight rotation of femoral head. Findings are highly suspicious for impacted fracture of the right femoral neck. Further correlation with  CT scan or MRI is recommended. These results were called by telephone at the time of interpretation on 05/22/2015 at 3:46 pm to Dr. [name] , who verbally acknowledged these results. Electronically Signed   By: Lahoma Crocker M.D.   On: 05/22/2015 15:47  Microbiology: Recent Results (from the past 240 hour(s))  Surgical pcr screen     Status: Abnormal   Collection Time: 05/23/15 12:31 AM  Result Value Ref Range Status   MRSA, PCR NEGATIVE NEGATIVE Final   Staphylococcus aureus POSITIVE (A) NEGATIVE Final    Comment:        The Xpert SA Assay (FDA approved for NASAL specimens in patients over 23 years of age), is one component of a comprehensive surveillance program.  Test performance has been validated by [facility] for patients greater than or equal to 59 year old. It is not intended to diagnose infection nor to guide or monitor treatment.      Labs: Basic Metabolic Panel:  Recent Labs Lab 05/22/15 1512 05/23/15 0001 05/24/15 0603 05/25/15 0420  NA 136  --  136 137  K 4.3  --  3.8 4.3  CL 104  --  104 103  CO2 21*  --  28 27  GLUCOSE 87  --  94 95  BUN 23*  --  11 8  CREATININE 0.85 0.83 0.74 0.79  CALCIUM 9.2  --  8.4* 9.1   Liver Function Tests: No results for input(s): AST, ALT, ALKPHOS, BILITOT, PROT, ALBUMIN in the last 168 hours. No results for input(s): LIPASE, AMYLASE in the last 168 hours. No results for input(s): AMMONIA in the last 168 hours. CBC:  Recent Labs Lab 05/22/15 1512 05/23/15 0001 05/24/15 0603 05/25/15 0420 05/26/15 0458  WBC 9.8 12.7* 9.0 9.0 7.8  NEUTROABS 7.7  --   --   --   --   HGB 12.4 12.8 10.3* 10.8* 9.6*  HCT 39.2 39.3 32.1* 33.5* 30.5*  MCV 92.0 90.6 92.8 91.5 90.2  PLT 292 282 216 231 255   Cardiac Enzymes: No results for input(s): CKTOTAL, CKMB, CKMBINDEX, TROPONINI in the last 168 hours. BNP: BNP (last 3 results) No results for input(s): BNP in the last 8760 hours.  ProBNP (last 3 results) No results  for input(s): PROBNP in the last 8760 hours.  CBG: No results for input(s): GLUCAP in the last 168 hours.     Signed:  Vernell Leep, MD, FACP, FHM. [facility] Pager [phone]  If 7PM-7AM, please contact night-coverage [email or web address] Password [facility] 05/26/2015, 3:34 PM

## 2015-05-26 NOTE — PMR Pre-admission (Signed)
PMR Admission Coordinator Pre-Admission Assessment  Patient: Katrina Burnett is an 82 y.o., female MRN: [id number] DOB: 1925-04-29 Height:   Weight:                Insurance Information HMO:     PPO:      PCP:      IPA:      80/20: yes     OTHER: no HMO PRIMARY: Medicare a and b      Policy#: [id number]      Subscriber: pt Benefits:  Phone #: online     Name: 05/26/15 Eff. Date: 08/09/91     Deduct: $1316      Out of Pocket Max: none      Life Max: none CIR: 100%      SNF: 20 full days Outpatient: 80%     Co-Pay: 20% Home Health: 100%      Co-Pay: none DME: 80%     Co-Pay: 20% Providers: pt choice  SECONDARY: [employer]      Policy#: [id number]      Subscriber: pt  Medicaid Application Date:       Case Manager:  Disability Application Date:       Case Worker:   Emergency [place]    Name Relation Home Work Mobile   Burnett,Katrina Daughter [phone]  [phone]   Katrina Burnett   [phone]     Current Medical History  Patient Admitting Diagnosis: right intertrochantric hip fracture  History of Present Illness: Katrina Burnett is a 82 y.o. right handed female with history of polymyalgia.  Independent prior to admission . One level home with 5 steps to entry. Admitted 05/22/2015 after a fall while walking her dog and neighbors dog knocking her down. She lost her balance and fell on her right side. No loss of consciousness. Sustained a small laceration to the right arm. X-rays and imaging revealed a right hip intertrochanteric fracture 2 part varus displacement. Underwent ORIF 05/23/2015 per Dr. Mayer Camel. 50% weightbearing right lower extremity. Hospital course pain management. Subcutaneous Lovenox for DVT prophylaxis. Acute blood loss anemia 9.6 and monitored.   Past Medical History  Past Medical History  Diagnosis Date  . Polymyalgia ([place])   . Lumbar compression fracture ([facility])     L1 and L2  . Allergic rhinitis   . Varicose veins   .  GERD (gastroesophageal reflux disease)   . Barrett's esophagus 06/1997  . Osteoporosis   . Chondromalacia   . Pneumonia   . Vaginitis   . Diverticulosis   . Tubular adenoma of colon 06/1999  . Glaucoma     Family History  family history includes Colon cancer (age of onset: 34) in her mother; Congestive Heart Failure in her mother.  Prior Rehab/Hospitalizations:  Has the patient had major surgery during 100 days prior to admission? No Pt had previously received SNF rehab after having pneumonia at [facility] in [place]  Current Medications   Current facility-administered medications:  .  acetaminophen (TYLENOL) tablet 650 mg, 650 mg, Oral, Q6H PRN **OR** [DISCONTINUED] acetaminophen (TYLENOL) suppository 650 mg, 650 mg, Rectal, Q6H PRN, Leighton Parody, PA-C .  azelastine (ASTELIN) 0.1 % nasal spray 1 spray, 1 spray, Each Nare, Daily, Nishant Dhungel, MD, 1 spray at 05/25/15 0927 .  citalopram (CELEXA) tablet 20 mg, 20 mg, Oral, Daily, Nishant Dhungel, MD, 20 mg at 05/26/15 1002 .  dorzolamide (TRUSOPT) 2 % ophthalmic solution 1 drop, 1 drop, Both Eyes, BID, Nishant  Dhungel, MD, 1 drop at 05/26/15 1012 .  enoxaparin (LOVENOX) injection 40 mg, 40 mg, Subcutaneous, Q24H, Nishant Dhungel, MD, 40 mg at 05/25/15 2053 .  famotidine (PEPCID) tablet 10 mg, 10 mg, Oral, BID, Nishant Dhungel, MD, 10 mg at 05/26/15 1001 .  feeding supplement (ENSURE ENLIVE) (ENSURE ENLIVE) liquid 237 mL, 237 mL, Oral, BID BM, Dale Yemassee, RD, 237 mL at 05/26/15 1607 .  fluticasone (FLONASE) 50 MCG/ACT nasal spray 1 spray, 1 spray, Each Nare, Daily, Nishant Dhungel, MD, 1 spray at 05/26/15 1011 .  HYDROcodone-acetaminophen (NORCO/VICODIN) 5-325 MG per tablet 1-2 tablet, 1-2 tablet, Oral, Q6H PRN, Louellen Molder, MD, 2 tablet at 05/26/15 1607 .  latanoprost (XALATAN) 0.005 % ophthalmic solution 1 drop, 1 drop, Both Eyes, QHS, Nishant Dhungel, MD, 1 drop at 05/25/15 2055 .  loratadine (CLARITIN) tablet 10 mg,  10 mg, Oral, Daily, Nishant Dhungel, MD, 10 mg at 05/26/15 1002 .  LORazepam (ATIVAN) tablet 1 mg, 1 mg, Oral, BID, Nishant Dhungel, MD, 1 mg at 05/26/15 1002 .  menthol-cetylpyridinium (CEPACOL) lozenge 3 mg, 1 lozenge, Oral, PRN, Catalina Gravel, MD .  methocarbamol (ROBAXIN) 500 mg in dextrose 5 % 50 mL IVPB, 500 mg, Intravenous, Q6H PRN, Nishant Dhungel, MD, 500 mg at 05/23/15 0256 .  morphine 2 MG/ML injection 2 mg, 2 mg, Intravenous, Q2H PRN, Modena Jansky, MD, 2 mg at 05/26/15 1134 .  mupirocin ointment (BACTROBAN) 2 % 1 application, 1 application, Nasal, BID, Nishant Dhungel, MD, 1 application at [id number] .  ondansetron (ZOFRAN) injection 4 mg, 4 mg, Intravenous, Q6H PRN, Rhetta Mura Schorr, NP, 4 mg at 05/22/15 2218 .  pantoprazole (PROTONIX) EC tablet 40 mg, 40 mg, Oral, Daily, Nishant Dhungel, MD, 40 mg at 05/26/15 1002 .  phenol (CHLORASEPTIC) mouth spray 1 spray, 1 spray, Mouth/Throat, PRN, Catalina Gravel, MD, 1 spray at 05/23/15 1738 .  senna-docusate (Senokot-S) tablet 1 tablet, 1 tablet, Oral, QHS PRN, Louellen Molder, MD, 1 tablet at 05/25/15 2054 .  zolpidem (AMBIEN) tablet 5 mg, 5 mg, Oral, QHS PRN, Dianne Dun, NP, 5 mg at 05/24/15 0011  Facility-Administered Medications Ordered in Other Encounters:  .  ibandronate (BONIVA) injection 3 mg, 3 mg, Intravenous, Once, Jani Gravel, MD  Patients Current Diet: DIET SOFT Room service appropriate?: Yes; Fluid consistency:: Thin Diet - low sodium heart healthy  Precautions / Restrictions Precautions Precautions: Fall Restrictions Weight Bearing Restrictions: Yes RLE Weight Bearing: Partial weight bearing RLE Partial Weight Bearing Percentage or Pounds: 50%   Has the patient had 2 or more falls or a fall with injury in the past year?No  Prior Activity Level Community (5-7x/wk): Independent and driving pta. did own adls. Katrina Burnett, son usually cooked. Pt states she typically does own adls, Katrina Burnett cooks or  goes to get food. She drives. And cares for her dog herself.  Home Assistive Devices / Equipment Home Assistive Devices/Equipment: Eyeglasses Home Equipment: Environmental consultant - 2 wheels, Shower seat - built in, [place] - single point  Prior Device Use: Indicate devices/aids used by the patient prior to current illness, exacerbation or injury? None of the above  Prior Functional Level Prior Function Level of Independence: Independent Comments: Very active at home  Self Care: Did the patient need help bathing, dressing, using the toilet or eating?  Independent  Indoor Mobility: Did the patient need assistance with walking from room to room (with or without device)? Independent  Stairs: Did the patient need assistance with internal or external stairs (  with or without device)? Independent  Functional Cognition: Did the patient need help planning regular tasks such as shopping or remembering to take medications? Independent  Current Functional Level Cognition  Overall Cognitive Status: Within Functional Limits for tasks assessed Orientation Level: Oriented X4    Extremity Assessment (includes Sensation/Coordination)  Upper Extremity Assessment: Generalized weakness  Lower Extremity Assessment: Defer to PT evaluation RLE Deficits / Details: Decreased strength, AROM and acute pain consistent with recent surgery.  RLE: Unable to fully assess due to pain RLE Coordination: decreased fine motor, decreased gross motor    ADLs  Overall ADL's : Needs assistance/impaired Eating/Feeding: Set up, Sitting Grooming: Set up, Sitting Lower Body Bathing: Minimal assistance, Sit to/from stand Lower Body Dressing: Minimal assistance, Sit to/from stand Toilet Transfer: Minimal assistance, +2 for safety/equipment, Stand-pivot, BSC, RW, Cueing for sequencing, Cueing for safety Toilet Transfer Details (indicate cue type and reason): Pt required min assist +2 for stand pivot transfer from EOB to chair. Pt reports  staff is not coming quickly enough to assist her to [facility] and she is haing to void in bed. Toileting- Clothing Manipulation and Hygiene: Minimal assistance, Sit to/from stand Toileting - Clothing Manipulation Details (indicate cue type and reason): Pt able to complete peri care in standing with min assist for balance in standing. Functional mobility during ADLs: Minimal assistance, +2 for safety/equipment, Rolling walker General ADL Comments: No family present for OT session. Pt in significant pain this session asking for morning meds and pain meds; RN notified. Pt tearful throughout session stating she felt like she couldnt breath. SpO2 96 on RA prior to activity; 93-99 on RA following functional activity/mobility.     Mobility  Overal bed mobility: Needs Assistance Bed Mobility: Supine to Sit Supine to sit: Min assist General bed mobility comments: verbal and tactile cues for sequencing; HOB elevated with use of bedrail; min A for bilat LE to EOB, elevating, trunk, and scooting hips forward    Transfers  Overall transfer level: Needs assistance Equipment used: Rolling walker (2 wheeled) Transfers: Sit to/from Stand, [employer] Transfers Sit to Stand: Min assist, +2 safety/equipment Stand pivot transfers: Min assist, +2 safety/equipment General transfer comment: vc for hand placement, maintaining PWB status, and position of RW and sequencing    Ambulation / Gait / Stairs / Wheelchair Mobility  Ambulation/Gait General Gait Details: Unable at this time due to pain    Posture / Balance Dynamic Sitting Balance Sitting balance - Comments: Poor mainly due to pain and guarding of anterior hip Balance Overall balance assessment: Needs assistance Sitting-balance support: No upper extremity supported, Feet supported Sitting balance-Leahy Scale: Fair Sitting balance - Comments: Poor mainly due to pain and guarding of anterior hip Standing balance support: Single extremity supported Standing  balance-Leahy Scale: Poor Standing balance comment: Pt able to complete peri care in standing with single extremity supported and min assist for balance.    Special needs/care consideration Skin skin tear right elbow. Surgical incision Bowel mgmt: LBM 1/12 and pt complains of constipation. continent Bladder mgmt: continent    Previous Home Environment Living Arrangements: Children, Other (Comment) (son, Katrina Burnett, moved in with her less than a year ago due to hi)  Lives With: Other (Comment) (Son, Katrina Burnett, lives with pt due to his issues/needs) Available Help at Discharge: Family, Available 24 hours/day, Other (Comment) (dtr, Katrina Burnett to provide the 24/7 care at d/c. David unreliable) Type of Home: House Home Layout: One level Home Access: Stairs to enter Entrance Stairs-Rails: Right, Left, Can reach  both Entrance Stairs-Number of Steps: 5 Bathroom Shower/Tub: Engineer, mining: Yes How Accessible: Accessible via walker Home Care Services: No Additional Comments: Pt's peekapoo dog is cared for by pt Son, Katrina Burnett, moved in with her less than a year ago due to his financial and social issues. He stays in his room most of the day.  Discharge Living Setting Plans for Discharge Living Setting: Patient's home, Other (Comment) (son, Katrina Burnett, moved in with pt less than a year ago) Type of Home at Discharge: House Discharge Home Layout: One level Discharge Home Access: Stairs to enter Entrance Stairs-Rails: Right, Left, Can reach both Entrance Stairs-Number of Steps: 5 Discharge Bathroom Shower/Tub: Walk-in shower Discharge Bathroom Toilet: Standard Discharge Bathroom Accessibility: Yes How Accessible: Accessible via walker Does the patient have any problems obtaining your medications?: No Son, Katrina Burnett in [place] is POA and dtr, Katrina Burnett lives in [place]. Katrina Burnett will be the caregiver 24/7. Katrina Burnett has social issues and unable to provide the care mentally. Always  call Katrina Burnett for d/c planning needs.Katrina Burnett will visit daily.  Social/Family/Support Systems Patient Roles: Parent Contact Information: Katrina Burnett is POA and lives in [place]. Katrina Burnett , will be caregiver Anticipated Caregiver: Katrina Burnett, dtr Anticipated Caregiver's Contact Information: see above Ability/Limitations of Caregiver: no limitations. Dtr lives in [place], but will move in Caregiver Availability: 24/7 Discharge Plan Discussed with Primary Caregiver: Yes Is Caregiver In Agreement with Plan?: Yes Does Caregiver/Family have Issues with Lodging/Transportation while Pt is in Rehab?: No  Goals/Additional Needs Patient/Family Goal for Rehab: supervision PT, supervision to min assist OT Expected length of stay: [facility] 7-10 days Pt/Family Agrees to Admission and willing to participate: Yes Program Orientation Provided & Reviewed with Pt/Caregiver Including Roles  & Responsibilities: Yes  Decrease burden of Care through IP rehab admission: n/a  Possible need for SNF placement upon discharge:not anticipated  Patient Condition: This patient's condition remains as documented in the consult dated 05/26/2015, in which the Rehabilitation Physician determined and documented that the patient's condition is appropriate for intensive rehabilitative care in an inpatient rehabilitation facility. Will admit to inpatient rehab today.  Preadmission Screen Completed By:  Cleatrice Burke, 05/26/2015 5:00 PM ______________________________________________________________________   Discussed status with Dr.  Posey Pronto on 05/26/2015 at  7 and received telephone approval for admission today.  Admission Coordinator:  Cleatrice Burke, time 1700 Date 05/26/2015.

## 2015-05-26 NOTE — Progress Notes (Signed)
Physical Medicine and Rehabilitation Consult Reason for Consult: Right intertrochanteric hip fracture Referring Physician: [facility]   HPI: Katrina Burnett is a 82 y.o. right handed female with history of polymyalgia. Patient lives with her son. He is not home 24/7. Independent prior to admission . One level home with 5 steps to entry. Admitted 05/22/2015 after a fall while walking her dog. She lost her balance and fell on her right side. No loss of consciousness. Sustained a small laceration to the right arm. X-rays and imaging revealed a right hip intertrochanteric fracture 2 part varus displacement. Underwent ORIF 05/23/2015 per Dr. Mayer Camel. 50% weightbearing right lower extremity. Hospital course pain management. Subcutaneous Lovenox for DVT prophylaxis. Acute blood loss anemia 9.6 and monitored. Physical therapy evaluation completed 05/24/2015 with recommendations of physical medicine rehabilitation consult.   Review of Systems  Constitutional: Negative for fever and chills.  HENT: Negative for hearing loss.  Eyes: Negative for blurred vision and double vision.  Respiratory: Negative for cough and shortness of breath.  Cardiovascular: Negative for chest pain, palpitations and leg swelling.  Gastrointestinal: Positive for constipation. Negative for nausea and vomiting.   GERD  Musculoskeletal: Positive for back pain. Negative for falls.  Skin: Negative for rash.  Neurological: Negative for seizures, loss of consciousness and headaches.  Psychiatric/Behavioral: Positive for depression.  All other systems reviewed and are negative.  Past Medical History  Diagnosis Date  . Polymyalgia ([place])   . Lumbar compression fracture ([facility])     L1 and L2  . Allergic rhinitis   . Varicose veins   . GERD (gastroesophageal reflux disease)   . Barrett's esophagus 06/1997  . Osteoporosis   . Chondromalacia   . Pneumonia   . Vaginitis   . Diverticulosis   .  Tubular adenoma of colon 06/1999  . Glaucoma    Past Surgical History  Procedure Laterality Date  . Varicose vein surgery    . Fixation kyphoplasty lumbar spine  10/12/10; 09/20/11  . Lung biopsy     Family History  Problem Relation Age of Onset  . Colon cancer Mother 60  . Congestive Heart Failure Mother    Social History:  reports that she has never smoked. She has never used smokeless tobacco. She reports that she does not drink alcohol or use illicit drugs. Allergies:  Allergies  Allergen Reactions  . Avelox [Moxifloxacin Hcl In Nacl]     Felt bad  . Biaxin [Clarithromycin] Nausea Only  . Cefuroxime Diarrhea  . Levaquin [Levofloxacin In D5w] Diarrhea  . Minocycline Nausea And Vomiting  . Paxil [Paroxetine Hcl] Nausea And Vomiting  . Prednisone Nausea Only  . Remeron [Mirtazapine]     Doesn't remember   . Sulfur Nausea And Vomiting  . Trimox [Amoxicillin] Other (See Comments)    Doesn't remember   . Viibryd [Vilazodone Hcl] Other (See Comments)    Doesn't remember    Medications Prior to Admission  Medication Sig Dispense Refill  . azelastine (ASTELIN) 137 MCG/SPRAY nasal spray Place 1 spray into the nose daily. Use in each nostril as directed    . cetirizine (ZYRTEC) 10 MG tablet Take 10 mg by mouth daily.    . cholecalciferol (VITAMIN D) 1000 UNITS tablet Take 1,000 Units by mouth daily.    . citalopram (CELEXA) 40 MG tablet Take 0.5 tablets (20 mg total) by mouth daily. 30 tablet 2  . dorzolamide (TRUSOPT) 2 % ophthalmic solution Place 1 drop into both eyes 2 (two) times daily.     Marland Kitchen  fluconazole (DIFLUCAN) 150 MG tablet Take 1 tablet (150 mg total) by mouth once. As needed for yeast 5 tablet 0  . fluticasone (FLONASE) 50 MCG/ACT nasal spray Place 1 spray into both nostrils daily.     Marland Kitchen ibuprofen (ADVIL,MOTRIN) 200 MG tablet Take 200 mg by mouth every  8 (eight) hours as needed. For pain    . lansoprazole (PREVACID) 30 MG capsule Take 30 mg by mouth daily.    Marland Kitchen latanoprost (XALATAN) 0.005 % ophthalmic solution Place 1 drop into both eyes at bedtime.    Marland Kitchen LORazepam (ATIVAN) 1 MG tablet Take 1 mg by mouth 2 (two) times daily.  0  . methocarbamol (ROBAXIN) 500 MG tablet Take 500 mg by mouth 2 (two) times daily as needed. Muscle spasms  2  . Multiple Vitamin (MULTIVITAMIN) tablet Take 1 tablet by mouth daily.    . Probiotic Product (ALIGN) 4 MG CAPS Take 4 mg by mouth daily.    . [DISCONTINUED] HYDROcodone-acetaminophen (NORCO/VICODIN) 5-325 MG tablet Take 0.5-1 tablets by mouth 2 (two) times daily as needed. pain  0    Home: Home Living Family/patient expects to be discharged to:: Private residence Living Arrangements: Children Available Help at Discharge: Family, Available 24 hours/day Type of Home: House Home Access: Stairs to enter [employer] of Steps: 5 Entrance Stairs-Rails: Right, Left, Can reach both Home Layout: One level Bathroom Shower/Tub: Multimedia programmer: Standard Bathroom Accessibility: Yes Home Equipment: Environmental consultant - 2 wheels, Shower seat - built in, [place] - single point  Functional History: Prior Function Level of Independence: Independent Comments: Very active at home Functional Status:  Mobility: Bed Mobility Overal bed mobility: Needs Assistance, +2 for physical assistance Bed Mobility: Supine to Sit Supine to sit: Min assist, +2 for physical assistance General bed mobility comments: Pt OOB in chair Transfers Overall transfer level: Needs assistance Equipment used: Rolling walker (2 wheeled) Transfers: Sit to/from Stand Sit to Stand: Min assist Stand pivot transfers: Min assist, +2 physical assistance, +2 safety/equipment General transfer comment: Min assist for sit to stand from chair. Decreased SpO2 with sit to stand. Ambulation/Gait General  Gait Details: Unable at this time due to pain    ADL: ADL Overall ADL's : Needs assistance/impaired Eating/Feeding: Set up, Sitting Grooming: Set up, Sitting Lower Body Bathing: Minimal assistance, Sit to/from stand Lower Body Dressing: Minimal assistance, Sit to/from stand Toilet Transfer: Minimal assistance, Stand-pivot, BSC, RW Functional mobility during ADLs: Minimal assistance, Rolling walker (for stand pivot transfer ) General ADL Comments: Family present but stepped out for OT eval. Educated pt on safety with RW, precautions, WB status; pt verbalized understanding. Discussed potential for post acute rehab; pt agreeable. Eval limited by drop in SpO2 and pt with lightheadness/dizziness upon standing. SpO2 dropped to high 50s with sit to stand, deep breathing techniques initiated and SpO2 up to 99 in sitting. Occasional SpO2 drops to low 80s when talking with therapist but back up to high 90s with deep breathing, Pt on 2.5 L O2 throughout.  Cognition: Cognition Overall Cognitive Status: Within Functional Limits for tasks assessed Orientation Level: Oriented X4 Cognition Arousal/Alertness: Awake/alert Behavior During Therapy: WFL for tasks assessed/performed Overall Cognitive Status: Within Functional Limits for tasks assessed  Blood pressure 95/61, pulse 81, temperature 98 F (36.7 C), temperature source Oral, resp. rate 16, SpO2 91 %. Physical Exam  Vitals reviewed. Constitutional: She is oriented to person, place, and time. She appears well-developed and well-nourished.  HENT:  Head: Normocephalic and atraumatic.  Eyes: Conjunctivae and  EOM are normal.  Neck: Normal range of motion. Neck supple. No thyromegaly present.  Cardiovascular: Normal rate and regular rhythm.  Respiratory: Effort normal and breath sounds normal. No respiratory distress.  GI: Soft. Bowel sounds are normal. She exhibits no distension.  Musculoskeletal: She exhibits edema and tenderness.    Neurological: She is alert and oriented to person, place, and time. She has normal reflexes.  Sensation intact to light touch Motor: B/L UE, LLE 5/5 proximal distal Right hip flexion limited by pain, knee extension 4/5, ankle dorsi/plantar flexion 5/5  Skin: Skin is warm and dry.  Hip incision clean and dry appropriately tender  Psychiatric: She has a normal mood and affect. Her behavior is normal.     Lab Results Last 24 Hours    Results for orders placed or performed during the hospital encounter of 05/22/15 (from the past 24 hour(s))  CBC Status: Abnormal   Collection Time: 05/26/15 4:58 AM  Result Value Ref Range   WBC 7.8 4.0 - 10.5 K/uL   RBC 3.38 (L) 3.87 - 5.11 MIL/uL   Hemoglobin 9.6 (L) 12.0 - 15.0 g/dL   HCT 30.5 (L) 36.0 - 46.0 %   MCV 90.2 78.0 - 100.0 fL   MCH 28.4 26.0 - 34.0 pg   MCHC 31.5 30.0 - 36.0 g/dL   RDW 13.3 11.5 - 15.5 %   Platelets 255 150 - 400 K/uL      Imaging Results (Last 48 hours)    No results found.    Assessment/Plan: Diagnosis: Right intertrochanteric hip fracture Labs and images independently reviewed. Records reviewed and summated above.  1. Does the need for close, 24 hr/day medical supervision in concert with the patient's rehab needs make it unreasonable for this patient to be served in a less intensive setting? Yes  2. Co-Morbidities requiring supervision/potential complications: pain management (Biofeedback training with therapies to help reduce reliance on opiate pain medications, monitor pain control during therapies, and sedation at rest and titrate to maximum efficacy to ensure participation and gains in therapies), polymyalgia, ABLA (transfuse if necessary to ensure appropriate perfusion for increased activity tolerance), anxiety (ensure anxiety and resulting apprehension do not limit functional progress; cont medications), falls (Cont safety education) 3. Due to skin/wound  care, disease management, medication administration, pain management and patient education, does the patient require 24 hr/day rehab nursing? Yes 4. Does the patient require coordinated care of a physician, rehab nurse, PT (1-2 hrs/day, 5 days/week) and OT (1-2 hrs/day, 5 days/week) to address physical and functional deficits in the context of the above medical diagnosis(es)? Yes Addressing deficits in the following areas: balance, endurance, locomotion, strength, transferring, bathing, dressing, toileting and psychosocial support 5. Can the patient actively participate in an intensive therapy program of at least 3 hrs of therapy per day at least 5 days per week? Yes 6. The potential for patient to make measurable gains while on inpatient rehab is excellent 7. Anticipated functional outcomes upon discharge from inpatient rehab are modified independent and supervision with PT, modified independent and supervision with OT, n/a with SLP. 8. Estimated rehab length of stay to reach the above functional goals is: 7-10 days. 9. Does the patient have adequate social supports and living environment to accommodate these discharge functional goals? Potentially 10. Anticipated D/C setting: Home 11. Anticipated post D/C treatments: HH therapy and Home excercise program 12. Overall Rehab/Functional Prognosis: excellent  RECOMMENDATIONS: This patient's condition is appropriate for continued rehabilitative care in the following setting: Need to clarify if  pt's son will be available 24/7 at discharge. Likely CIR Patient has agreed to participate in recommended program. Yes Note that insurance prior authorization may be required for reimbursement for recommended care.  Comment: Rehab Admissions Coordinator to follow up.  Delice Lesch, MD 05/26/2015       Revision History     Date/Time User Provider Type Action   05/26/2015 11:07 AM Ankit Lorie Phenix, MD Physician Sign   05/26/2015 7:03 AM Cathlyn Parsons, PA-C Physician Assistant Pend   View Details Report       Routing History     Date/Time From To Method   05/26/2015 11:07 AM Ankit Lorie Phenix, MD Ankit Lorie Phenix, MD In [facility]   05/26/2015 11:07 AM Ankit Lorie Phenix, MD Jani Gravel, MD Fax

## 2015-05-26 NOTE — Progress Notes (Signed)
Patient ID: Katrina Burnett, female   DOB: 03-20-1925, 82 y.o.   MRN: [id number] PATIENT ID: Katrina Burnett  MRN: [id number]  DOB/AGE:  [birth year] / 82 y.o.  3 Days Post-Op Procedure(s) (LRB): OPEN REDUCTION INTERNAL FIXATION HIP (Right)    PROGRESS NOTE Subjective: Patient is alert, oriented, no Nausea, no Vomiting, yes passing gas, . Taking PO well. Denies SOB, Chest or Calf Pain. Using Incentive Spirometer, PAS in place. Ambulate PWB,  Patient reports pain as  5/10  .    Objective: Vital signs in last 24 hours: Filed Vitals:   05/25/15 0729 05/25/15 1400 05/25/15 2226 05/26/15 0508  BP: 128/62 125/63 110/64 95/61  Pulse: 75 101 85 81  Temp: 98.7 F (37.1 C) 98.4 F (36.9 C) 98.8 F (37.1 C) 98 F (36.7 C)  TempSrc:   Oral Oral  Resp: 18 16 16 16   SpO2: 100% 95% 93% 91%      Intake/Output from previous day: I/O last 3 completed shifts: In: 1060 [P.O.:1060] Out: 300 [Urine:300]   Intake/Output this shift:     LABORATORY DATA:  Recent Labs  05/24/15 0603 05/25/15 0420 05/26/15 0458  WBC 9.0 9.0 7.8  HGB 10.3* 10.8* 9.6*  HCT 32.1* 33.5* 30.5*  PLT 216 231 255  NA 136 137  --   K 3.8 4.3  --   CL 104 103  --   CO2 28 27  --   BUN 11 8  --   CREATININE 0.74 0.79  --   GLUCOSE 94 95  --   CALCIUM 8.4* 9.1  --     Examination: Neurologically intact ABD soft Neurovascular intact Sensation intact distally Intact pulses distally Dorsiflexion/Plantar flexion intact Incision: scant drainage No cellulitis present Compartment soft} XR AP&Lat of hip shows well placed\fixed DHS Assessment:   3 Days Post-Op Procedure(s) (LRB): OPEN REDUCTION INTERNAL FIXATION HIP (Right) ADDITIONAL DIAGNOSIS:  Expected Acute Blood Loss Anemia, Polymyalgia  Plan: PT/OT 50% WB, may shower when cleared by PT DVT Prophylaxis: SCDx72 hrs, Lovenox 40mg  SQ QD x 2 weeks  DISCHARGE PLAN: Skilled Nursing Facility/Rehab  DISCHARGE NEEDS: HHPT, Walker and 3-in-1 comode seat

## 2015-05-26 NOTE — Progress Notes (Signed)
Physical Therapy Treatment Patient Details Name: Katrina Burnett MRN: [id number] DOB: [birth year] Today's Date: 05/26/2015    History of Present Illness Pt is an 82 y/o female who presents s/p L intertrochanteric hip fracture.     PT Comments    Patient limited by pain this session and emotional. Pt c/o of not getting enough O2. SpO2 remained in 90s throughout session. Continue to progress as tolerated.  Follow Up Recommendations  CIR;Supervision for mobility/OOB     Equipment Recommendations  3in1 (PT)    Recommendations for Other Services       Precautions / Restrictions Precautions Precautions: Fall Restrictions Weight Bearing Restrictions: Yes RLE Weight Bearing: Partial weight bearing RLE Partial Weight Bearing Percentage or Pounds: 50%    Mobility  Bed Mobility Overal bed mobility: Needs Assistance Bed Mobility: Supine to Sit     Supine to sit: Min assist     General bed mobility comments: verbal and tactile cues for sequencing; HOB elevated with use of bedrail; min A for bilat LE to EOB, elevating, trunk, and scooting hips forward  Transfers Overall transfer level: Needs assistance Equipment used: Rolling walker (2 wheeled) Transfers: Sit to/from [employer] Sit to Stand: Min assist;+2 safety/equipment Stand pivot transfers: Min assist;+2 safety/equipment       General transfer comment: vc for hand placement, maintaining PWB status, and position of RW and sequencing  Ambulation/Gait                 Stairs            Wheelchair Mobility    Modified Rankin (Stroke Patients Only)       Balance Overall balance assessment: Needs assistance Sitting-balance support: No upper extremity supported;Feet supported Sitting balance-Leahy Scale: Fair     Standing balance support: Single extremity supported Standing balance-Leahy Scale: Poor Standing balance comment: Pt able to complete peri care in standing with single extremity  supported and min assist for balance.                    Cognition Arousal/Alertness: Awake/alert Behavior During Therapy: WFL for tasks assessed/performed Overall Cognitive Status: Within Functional Limits for tasks assessed                      Exercises      General Comments General comments (skin integrity, edema, etc.): pt very emotional at times and crying       Pertinent Vitals/Pain Pain Assessment: 0-10 Pain Score: 10-Worst pain ever Pain Location: R hip Pain Descriptors / Indicators: Sore Pain Intervention(s): Limited activity within patient's tolerance;Monitored during session;Repositioned;RN gave pain meds during session;Ice applied    Home Living                      Prior Function            PT Goals (current goals can now be found in the care plan section) Acute Rehab PT Goals Patient Stated Goal: Decrease pain Progress towards PT goals: Progressing toward goals    Frequency  Min 3X/week    PT Plan Current plan remains appropriate    Co-evaluation   Reason for Co-Treatment: For patient/therapist safety PT goals addressed during session: Mobility/safety with mobility OT goals addressed during session: ADL's and self-care     End of Session Equipment Utilized During Treatment: Gait belt Activity Tolerance: Patient limited by pain Patient left: in chair;with chair alarm set;with call bell/phone within reach  Time: [id number] PT Time Calculation (min) (ACUTE ONLY): 26 min  Charges:  $Therapeutic Activity: 23-37 mins                    G Codes:      Salina April, PTA Pager: [phone]   05/26/2015, 3:44 PM

## 2015-05-26 NOTE — H&P (View-Only) (Signed)
Physical Medicine and Rehabilitation Admission H&P    Chief Complaint  Patient presents with  . Hip Pain  . Fall  : HPI: Katrina Burnett is a 82 y.o. right handed female with history of polymyalgia. Patient lives with her son. He is not home 24/7. Independent prior to admission . One level home with 5 steps to entry. Admitted 05/22/2015 after a fall while walking her dog. She lost her balance and fell on her right side. No loss of consciousness. Sustained a small laceration to the right arm. X-rays and imaging revealed a right hip intertrochanteric fracture 2 part varus displacement. Underwent ORIF 05/23/2015 per Dr. Mayer Camel. 50% weightbearing right lower extremity. Hospital course pain management. Subcutaneous Lovenox for DVT prophylaxis. Acute blood loss anemia 9.6 and monitored. Physical therapy evaluation completed 05/24/2015 with recommendations of physical medicine rehabilitation consult. Patient was admitted for comprehensive rehabilitation program  ROS Constitutional: Negative for fever and chills.  HENT: Negative for hearing loss.  Eyes: Negative for blurred vision and double vision.  Respiratory: Negative for cough and shortness of breath.  Cardiovascular: Negative for chest pain, palpitations and leg swelling.  Gastrointestinal: Positive for constipation. Negative for nausea and vomiting.   GERD  Musculoskeletal: Positive for back pain. Negative for falls.  Skin: Negative for rash.  Neurological: Negative for seizures, loss of consciousness and headaches.  Psychiatric/Behavioral: Positive for depression.  All other systems reviewed and are negative   Past Medical History  Diagnosis Date  . Polymyalgia ([place])   . Lumbar compression fracture ([facility])     L1 and L2  . Allergic rhinitis   . Varicose veins   . GERD (gastroesophageal reflux disease)   . Barrett's esophagus 06/1997  . Osteoporosis   . Chondromalacia   . Pneumonia   . Vaginitis   . Diverticulosis   .  Tubular adenoma of colon 06/1999  . Glaucoma    Past Surgical History  Procedure Laterality Date  . Varicose vein surgery    . Fixation kyphoplasty lumbar spine  10/12/10; 09/20/11  . Lung biopsy     Family History  Problem Relation Age of Onset  . Colon cancer Mother 38  . Congestive Heart Failure Mother    Social History:  reports that she has never smoked. She has never used smokeless tobacco. She reports that she does not drink alcohol or use illicit drugs. Allergies:  Allergies  Allergen Reactions  . Avelox [Moxifloxacin Hcl In Nacl]     Felt bad  . Biaxin [Clarithromycin] Nausea Only  . Cefuroxime Diarrhea  . Levaquin [Levofloxacin In D5w] Diarrhea  . Minocycline Nausea And Vomiting  . Paxil [Paroxetine Hcl] Nausea And Vomiting  . Prednisone Nausea Only  . Remeron [Mirtazapine]     Doesn't remember   . Sulfur Nausea And Vomiting  . Trimox [Amoxicillin] Other (See Comments)    Doesn't remember   . Viibryd [Vilazodone Hcl] Other (See Comments)    Doesn't remember    Medications Prior to Admission  Medication Sig Dispense Refill  . azelastine (ASTELIN) 137 MCG/SPRAY nasal spray Place 1 spray into the nose daily. Use in each nostril as directed    . cetirizine (ZYRTEC) 10 MG tablet Take 10 mg by mouth daily.    . cholecalciferol (VITAMIN D) 1000 UNITS tablet Take 1,000 Units by mouth daily.    . citalopram (CELEXA) 40 MG tablet Take 0.5 tablets (20 mg total) by mouth daily. 30 tablet 2  . dorzolamide (TRUSOPT) 2 % ophthalmic solution  Place 1 drop into both eyes 2 (two) times daily.     . fluconazole (DIFLUCAN) 150 MG tablet Take 1 tablet (150 mg total) by mouth once. As needed for yeast 5 tablet 0  . fluticasone (FLONASE) 50 MCG/ACT nasal spray Place 1 spray into both nostrils daily.     Marland Kitchen ibuprofen (ADVIL,MOTRIN) 200 MG tablet Take 200 mg by mouth every 8 (eight) hours as needed. For pain    . lansoprazole (PREVACID) 30 MG capsule Take 30 mg by mouth daily.    Marland Kitchen  latanoprost (XALATAN) 0.005 % ophthalmic solution Place 1 drop into both eyes at bedtime.    Marland Kitchen LORazepam (ATIVAN) 1 MG tablet Take 1 mg by mouth 2 (two) times daily.  0  . methocarbamol (ROBAXIN) 500 MG tablet Take 500 mg by mouth 2 (two) times daily as needed. Muscle spasms  2  . Multiple Vitamin (MULTIVITAMIN) tablet Take 1 tablet by mouth daily.    . Probiotic Product (ALIGN) 4 MG CAPS Take 4 mg by mouth daily.    . [DISCONTINUED] HYDROcodone-acetaminophen (NORCO/VICODIN) 5-325 MG tablet Take 0.5-1 tablets by mouth 2 (two) times daily as needed. pain  0    Home: Home Living Family/patient expects to be discharged to:: Private residence Living Arrangements: Children Available Help at Discharge: Family, Available 24 hours/day Type of Home: House Home Access: Stairs to enter [employer] of Steps: 5 Entrance Stairs-Rails: Right, Left, Can reach both Home Layout: One level Bathroom Shower/Tub: Multimedia programmer: Standard Bathroom Accessibility: Yes Home Equipment: Environmental consultant - 2 wheels, Shower seat - built in, [place] - single point   Functional History: Prior Function Level of Independence: Independent Comments: Very active at home  Functional Status:  Mobility: Bed Mobility Overal bed mobility: Needs Assistance Bed Mobility: Supine to Sit Supine to sit: Min assist General bed mobility comments: VCs for sequencing and technique. Transfers Overall transfer level: Needs assistance Equipment used: Rolling walker (2 wheeled) Transfers: Sit to/from Stand Sit to Stand: Min assist, +2 safety/equipment Stand pivot transfers: Min assist, +2 safety/equipment General transfer comment: Min assist +2 for sit to stand and stand pivot transfer. VCs throughout for sequencing and hand placement.  Ambulation/Gait General Gait Details: Unable at this time due to pain    ADL: ADL Overall ADL's : Needs assistance/impaired Eating/Feeding: Set up, Sitting Grooming: Set up,  Sitting Lower Body Bathing: Minimal assistance, Sit to/from stand Lower Body Dressing: Minimal assistance, Sit to/from stand Toilet Transfer: Minimal assistance, +2 for safety/equipment, Stand-pivot, BSC, RW, Cueing for sequencing, Cueing for safety Toilet Transfer Details (indicate cue type and reason): Pt required min assist +2 for stand pivot transfer from EOB to chair. Pt reports staff is not coming quickly enough to assist her to [facility] and she is haing to void in bed. Toileting- Clothing Manipulation and Hygiene: Minimal assistance, Sit to/from stand Toileting - Clothing Manipulation Details (indicate cue type and reason): Pt able to complete peri care in standing with min assist for balance in standing. Functional mobility during ADLs: Minimal assistance, +2 for safety/equipment, Rolling walker General ADL Comments: No family present for OT session. Pt in significant pain this session asking for morning meds and pain meds; RN notified. Pt tearful throughout session stating she felt like she couldnt breath. SpO2 96 on RA prior to activity; 93-99 on RA following functional activity/mobility.   Cognition: Cognition Overall Cognitive Status: Within Functional Limits for tasks assessed Orientation Level: Oriented X4 Cognition Arousal/Alertness: Awake/alert Behavior During Therapy: WFL for tasks assessed/performed  Overall Cognitive Status: Within Functional Limits for tasks assessed  Physical Exam: Blood pressure 95/61, pulse 81, temperature 98 F (36.7 C), temperature source Oral, resp. rate 16, SpO2 91 %. Physical Exam Constitutional: She is oriented to person, place, and time. She appears well-developed and well-nourished.  HENT:  Head: Normocephalic and atraumatic.  Eyes: Conjunctivae and EOM are normal.  Neck: Normal range of motion. Neck supple. No thyromegaly present.  Cardiovascular: Normal rate and regular rhythm.  Respiratory: Effort normal and breath sounds normal. No  respiratory distress.  GI: Soft. Bowel sounds are normal. She exhibits no distension.  Musculoskeletal: She exhibits edema and tenderness.  Neurological: She is alert and oriented to person, place, and time. She has normal reflexes.  Sensation intact to light touch Motor: B/L UE, LLE 5/5 proximal distal Right hip flexion limited by pain, knee extension 4/5, ankle dorsi/plantar flexion 5/5  Skin: Skin is warm and dry.  Hip incision clean and dry appropriately tender  Psychiatric: She has a normal mood and affect. Her behavior is normal   Results for orders placed or performed during the hospital encounter of 05/22/15 (from the past 48 hour(s))  CBC     Status: Abnormal   Collection Time: 05/25/15  4:20 AM  Result Value Ref Range   WBC 9.0 4.0 - 10.5 K/uL   RBC 3.66 (L) 3.87 - 5.11 MIL/uL   Hemoglobin 10.8 (L) 12.0 - 15.0 g/dL   HCT 33.5 (L) 36.0 - 46.0 %   MCV 91.5 78.0 - 100.0 fL   MCH 29.5 26.0 - 34.0 pg   MCHC 32.2 30.0 - 36.0 g/dL   RDW 13.2 11.5 - 15.5 %   Platelets 231 150 - 400 K/uL  Basic metabolic panel     Status: None   Collection Time: 05/25/15  4:20 AM  Result Value Ref Range   Sodium 137 135 - 145 mmol/L   Potassium 4.3 3.5 - 5.1 mmol/L   Chloride 103 101 - 111 mmol/L   CO2 27 22 - 32 mmol/L   Glucose, Bld 95 65 - 99 mg/dL   BUN 8 6 - 20 mg/dL   Creatinine, Ser 0.79 0.44 - 1.00 mg/dL   Calcium 9.1 8.9 - 10.3 mg/dL   GFR calc non Af Amer >60 >60 mL/min   GFR calc Af Amer >60 >60 mL/min    Comment: (NOTE) The eGFR has been calculated using the CKD EPI equation. This calculation has not been validated in all clinical situations. eGFR's persistently <60 mL/min signify possible Chronic Kidney Disease.    Anion gap 7 5 - 15  CBC     Status: Abnormal   Collection Time: 05/26/15  4:58 AM  Result Value Ref Range   WBC 7.8 4.0 - 10.5 K/uL   RBC 3.38 (L) 3.87 - 5.11 MIL/uL   Hemoglobin 9.6 (L) 12.0 - 15.0 g/dL   HCT 30.5 (L) 36.0 - 46.0 %   MCV 90.2 78.0 - 100.0  fL   MCH 28.4 26.0 - 34.0 pg   MCHC 31.5 30.0 - 36.0 g/dL   RDW 13.3 11.5 - 15.5 %   Platelets 255 150 - 400 K/uL   No results found.   Medical Problem List and Plan: 1.  Gait instability secondary to right intertrochanteric hip fracture. Status post ORIF. 50% weightbearing. 2.  DVT Prophylaxis/Anticoagulation: Lovenox. Monitor platelet counts of any signs of bleeding. Check vascular study. 3. Pain Management/polymyalgia: Hydrocodone and Robaxin as needed. Monitor with increased mobility 4. Mood: Celexa  20 mg daily, Ativan 1 mg twice a day. Provide emotional support 5. Neuropsych: This patient is capable of making decisions on her own behalf. 6. Skin/Wound Care: Routine skin checks 7. Fluids/Electrolytes/Nutrition: Routine eye and nose with follow-up chemistries 8. Acute blood loss anemia. Follow-up CBC  Post Admission Physician Evaluation: 1. Functional deficits secondary  to right intertrochanteric hip fracture. 2. Patient is admitted to receive collaborative, interdisciplinary care between the physiatrist, rehab nursing staff, and therapy team. 3. Patient's level of medical complexity and substantial therapy needs in context of that medical necessity cannot be provided at a lesser intensity of care such as a SNF. 4. Patient has experienced substantial functional loss from his/her baseline which was documented above under the "Functional History" and "Functional Status" headings.  Judging by the patient's diagnosis, physical exam, and functional history, the patient has potential for functional progress which will result in measurable gains while on inpatient rehab.  These gains will be of substantial and practical use upon discharge  in facilitating mobility and self-care at the household level. 5. Physiatrist will provide 24 hour management of medical needs as well as oversight of the therapy plan/treatment and provide guidance as appropriate regarding the interaction of the two. 6. 24  hour rehab nursing will assist with skin/wound care, disease management, medication administration, pain management and patient education and help integrate therapy concepts, techniques,education, etc. 7. PT will assess and treat for/with: Lower extremity strength, range of motion, stamina, balance, functional mobility, safety, adaptive techniques and equipment, woundcare, coping skills, pain control, education.   Goals are: Supervision/Mod I. 8. OT will assess and treat for/with: ADL's, functional mobility, safety, upper extremity strength, adaptive techniques and equipment, wound mgt, ego support, and community reintegration.   Goals are: Supervision/Mod I. Therapy may not proceed with showering this patient. 9. Case Management and Social Worker will assess and treat for psychological issues and discharge planning. 10. Team conference will be held weekly to assess progress toward goals and to determine barriers to discharge. 11. Patient will receive at least 3 hours of therapy per day at least 5 days per week. 12. ELOS: 7-10 days.       13. Prognosis:  excellent  Delice Lesch, MD 05/26/2015

## 2015-05-26 NOTE — Care Management Note (Signed)
Case Management Note  Patient Details  Name: Katrina Burnett MRN: [id number] Date of Birth: 1924/09/06  Subjective/Objective:         Admitted with right hip fracture, s/p ORIF right hip on 05/23/15           Action/Plan: PT recommended inpatient rehab. Patent to evaluated by [facility] and to discharge to CIR today.  Expected Discharge Date:   (unknown)               Expected Discharge Plan:  IP Rehab Facility  In-House Referral:  Clinical Social Work  Discharge planning Services  CM Consult  Post Acute Care Choice:    Choice offered to:     DME Arranged:    DME Agency:     HH Arranged:    [place] Agency:     Status of Service:  Completed, signed off  Medicare Important Message Given:  Yes Date Medicare IM Given:    Medicare IM give by:    Date Additional Medicare IM Given:    Additional Medicare Important Message give by:     If discussed at [place] of Stay Meetings, dates discussed:    Additional Comments:  Nickolas, [place], RN 05/26/2015, 4:34 PM

## 2015-05-26 NOTE — Interval H&P Note (Signed)
Katrina Burnett was admitted today to Inpatient Rehabilitation with the diagnosis of right intertrochanteric hip fracture.  The patient's history has been reviewed, patient examined, and there is no change in status.  Patient continues to be appropriate for intensive inpatient rehabilitation.  I have reviewed the patient's chart and labs.  Questions were answered to the patient's satisfaction. The PAPE has been reviewed and assessment remains appropriate.  Katrina Burnett Lorie Phenix 05/26/2015, 8:16 PM

## 2015-05-26 NOTE — Clinical Social Work Note (Signed)
CSW received referral for SNF.  Case discussed with case manager and plan is to discharge to CIR.  CSW to sign off please re-consult if social work needs arise.  Jones Broom. [place], MSW, [place]

## 2015-05-26 NOTE — Consult Note (Signed)
Physical Medicine and Rehabilitation Consult Reason for Consult: Right intertrochanteric hip fracture Referring Physician: [facility]   HPI: Katrina Burnett is a 82 y.o. right handed female with history of polymyalgia. Patient lives with her son. He is not home 24/7. Independent prior to admission . One level home with 5 steps to entry. Admitted 05/22/2015 after a fall while walking her dog. She lost her balance and fell on her right side. No loss of consciousness. Sustained a small laceration to the right arm. X-rays and imaging revealed a right hip intertrochanteric fracture 2 part varus displacement. Underwent ORIF 05/23/2015 per Dr. Mayer Camel. 50% weightbearing right lower extremity. Hospital course pain management. Subcutaneous Lovenox for DVT prophylaxis. Acute blood loss anemia 9.6 and monitored. Physical therapy evaluation completed 05/24/2015 with recommendations of physical medicine rehabilitation consult.   Review of Systems  Constitutional: Negative for fever and chills.  HENT: Negative for hearing loss.   Eyes: Negative for blurred vision and double vision.  Respiratory: Negative for cough and shortness of breath.   Cardiovascular: Negative for chest pain, palpitations and leg swelling.  Gastrointestinal: Positive for constipation. Negative for nausea and vomiting.       GERD  Musculoskeletal: Positive for back pain. Negative for falls.  Skin: Negative for rash.  Neurological: Negative for seizures, loss of consciousness and headaches.  Psychiatric/Behavioral: Positive for depression.  All other systems reviewed and are negative.  Past Medical History  Diagnosis Date  . Polymyalgia ([place])   . Lumbar compression fracture ([facility])     L1 and L2  . Allergic rhinitis   . Varicose veins   . GERD (gastroesophageal reflux disease)   . Barrett's esophagus 06/1997  . Osteoporosis   . Chondromalacia   . Pneumonia   . Vaginitis   . Diverticulosis   . Tubular adenoma of colon 06/1999    . Glaucoma    Past Surgical History  Procedure Laterality Date  . Varicose vein surgery    . Fixation kyphoplasty lumbar spine  10/12/10; 09/20/11  . Lung biopsy     Family History  Problem Relation Age of Onset  . Colon cancer Mother 58  . Congestive Heart Failure Mother    Social History:  reports that she has never smoked. She has never used smokeless tobacco. She reports that she does not drink alcohol or use illicit drugs. Allergies:  Allergies  Allergen Reactions  . Avelox [Moxifloxacin Hcl In Nacl]     Felt bad  . Biaxin [Clarithromycin] Nausea Only  . Cefuroxime Diarrhea  . Levaquin [Levofloxacin In D5w] Diarrhea  . Minocycline Nausea And Vomiting  . Paxil [Paroxetine Hcl] Nausea And Vomiting  . Prednisone Nausea Only  . Remeron [Mirtazapine]     Doesn't remember   . Sulfur Nausea And Vomiting  . Trimox [Amoxicillin] Other (See Comments)    Doesn't remember   . Viibryd [Vilazodone Hcl] Other (See Comments)    Doesn't remember    Medications Prior to Admission  Medication Sig Dispense Refill  . azelastine (ASTELIN) 137 MCG/SPRAY nasal spray Place 1 spray into the nose daily. Use in each nostril as directed    . cetirizine (ZYRTEC) 10 MG tablet Take 10 mg by mouth daily.    . cholecalciferol (VITAMIN D) 1000 UNITS tablet Take 1,000 Units by mouth daily.    . citalopram (CELEXA) 40 MG tablet Take 0.5 tablets (20 mg total) by mouth daily. 30 tablet 2  . dorzolamide (TRUSOPT) 2 % ophthalmic solution Place 1  drop into both eyes 2 (two) times daily.     . fluconazole (DIFLUCAN) 150 MG tablet Take 1 tablet (150 mg total) by mouth once. As needed for yeast 5 tablet 0  . fluticasone (FLONASE) 50 MCG/ACT nasal spray Place 1 spray into both nostrils daily.     Marland Kitchen ibuprofen (ADVIL,MOTRIN) 200 MG tablet Take 200 mg by mouth every 8 (eight) hours as needed. For pain    . lansoprazole (PREVACID) 30 MG capsule Take 30 mg by mouth daily.    Marland Kitchen latanoprost (XALATAN) 0.005 % ophthalmic  solution Place 1 drop into both eyes at bedtime.    Marland Kitchen LORazepam (ATIVAN) 1 MG tablet Take 1 mg by mouth 2 (two) times daily.  0  . methocarbamol (ROBAXIN) 500 MG tablet Take 500 mg by mouth 2 (two) times daily as needed. Muscle spasms  2  . Multiple Vitamin (MULTIVITAMIN) tablet Take 1 tablet by mouth daily.    . Probiotic Product (ALIGN) 4 MG CAPS Take 4 mg by mouth daily.    . [DISCONTINUED] HYDROcodone-acetaminophen (NORCO/VICODIN) 5-325 MG tablet Take 0.5-1 tablets by mouth 2 (two) times daily as needed. pain  0    Home: Home Living Family/patient expects to be discharged to:: Private residence Living Arrangements: Children Available Help at Discharge: Family, Available 24 hours/day Type of Home: House Home Access: Stairs to enter [employer] of Steps: 5 Entrance Stairs-Rails: Right, Left, Can reach both Home Layout: One level Bathroom Shower/Tub: Multimedia programmer: Standard Bathroom Accessibility: Yes Home Equipment: Environmental consultant - 2 wheels, Shower seat - built in, [place] - single point  Functional History: Prior Function Level of Independence: Independent Comments: Very active at home Functional Status:  Mobility: Bed Mobility Overal bed mobility: Needs Assistance, +2 for physical assistance Bed Mobility: Supine to Sit Supine to sit: Min assist, +2 for physical assistance General bed mobility comments: Pt OOB in chair Transfers Overall transfer level: Needs assistance Equipment used: Rolling walker (2 wheeled) Transfers: Sit to/from Stand Sit to Stand: Min assist Stand pivot transfers: Min assist, +2 physical assistance, +2 safety/equipment General transfer comment: Min assist for sit to stand from chair. Decreased SpO2 with sit to stand. Ambulation/Gait General Gait Details: Unable at this time due to pain    ADL: ADL Overall ADL's : Needs assistance/impaired Eating/Feeding: Set up, Sitting Grooming: Set up, Sitting Lower Body Bathing: Minimal  assistance, Sit to/from stand Lower Body Dressing: Minimal assistance, Sit to/from stand Toilet Transfer: Minimal assistance, Stand-pivot, BSC, RW Functional mobility during ADLs: Minimal assistance, Rolling walker (for stand pivot transfer ) General ADL Comments: Family present but stepped out for OT eval. Educated pt on safety with RW, precautions, WB status; pt verbalized understanding. Discussed potential for post acute rehab; pt agreeable. Eval limited by drop in SpO2 and pt with lightheadness/dizziness upon standing. SpO2 dropped to high 50s with sit to stand, deep breathing techniques initiated and SpO2 up to 99 in sitting. Occasional SpO2 drops to low 80s when talking with therapist but back up to high 90s with deep breathing, Pt on 2.5 L O2 throughout.  Cognition: Cognition Overall Cognitive Status: Within Functional Limits for tasks assessed Orientation Level: Oriented X4 Cognition Arousal/Alertness: Awake/alert Behavior During Therapy: WFL for tasks assessed/performed Overall Cognitive Status: Within Functional Limits for tasks assessed  Blood pressure 95/61, pulse 81, temperature 98 F (36.7 C), temperature source Oral, resp. rate 16, SpO2 91 %. Physical Exam  Vitals reviewed. Constitutional: She is oriented to person, place, and time. She appears well-developed  and well-nourished.  HENT:  Head: Normocephalic and atraumatic.  Eyes: Conjunctivae and EOM are normal.  Neck: Normal range of motion. Neck supple. No thyromegaly present.  Cardiovascular: Normal rate and regular rhythm.   Respiratory: Effort normal and breath sounds normal. No respiratory distress.  GI: Soft. Bowel sounds are normal. She exhibits no distension.  Musculoskeletal: She exhibits edema and tenderness.  Neurological: She is alert and oriented to person, place, and time. She has normal reflexes.  Sensation intact to light touch Motor: B/L UE, LLE 5/5 proximal distal Right hip flexion limited by pain, knee  extension 4/5, ankle dorsi/plantar flexion 5/5  Skin: Skin is warm and dry.  Hip incision clean and dry appropriately tender  Psychiatric: She has a normal mood and affect. Her behavior is normal.    Results for orders placed or performed during the hospital encounter of 05/22/15 (from the past 24 hour(s))  CBC     Status: Abnormal   Collection Time: 05/26/15  4:58 AM  Result Value Ref Range   WBC 7.8 4.0 - 10.5 K/uL   RBC 3.38 (L) 3.87 - 5.11 MIL/uL   Hemoglobin 9.6 (L) 12.0 - 15.0 g/dL   HCT 30.5 (L) 36.0 - 46.0 %   MCV 90.2 78.0 - 100.0 fL   MCH 28.4 26.0 - 34.0 pg   MCHC 31.5 30.0 - 36.0 g/dL   RDW 13.3 11.5 - 15.5 %   Platelets 255 150 - 400 K/uL   No results found.  Assessment/Plan: Diagnosis: Right intertrochanteric hip fracture Labs and images independently reviewed.  Records reviewed and summated above.  1. Does the need for close, 24 hr/day medical supervision in concert with the patient's rehab needs make it unreasonable for this patient to be served in a less intensive setting? Yes  2. Co-Morbidities requiring supervision/potential complications: pain management (Biofeedback training with therapies to help reduce reliance on opiate pain medications, monitor pain control during therapies, and sedation at rest and titrate to maximum efficacy to ensure participation and gains in therapies), polymyalgia, ABLA (transfuse if necessary to ensure appropriate perfusion for increased activity tolerance), anxiety (ensure anxiety and resulting apprehension do not limit functional progress; cont medications), falls (Cont safety education) 3. Due to skin/wound care, disease management, medication administration, pain management and patient education, does the patient require 24 hr/day rehab nursing? Yes 4. Does the patient require coordinated care of a physician, rehab nurse, PT (1-2 hrs/day, 5 days/week) and OT (1-2 hrs/day, 5 days/week) to address physical and functional deficits in the  context of the above medical diagnosis(es)? Yes Addressing deficits in the following areas: balance, endurance, locomotion, strength, transferring, bathing, dressing, toileting and psychosocial support 5. Can the patient actively participate in an intensive therapy program of at least 3 hrs of therapy per day at least 5 days per week? Yes 6. The potential for patient to make measurable gains while on inpatient rehab is excellent 7. Anticipated functional outcomes upon discharge from inpatient rehab are modified independent and supervision  with PT, modified independent and supervision with OT, n/a with SLP. 8. Estimated rehab length of stay to reach the above functional goals is: 7-10 days. 9. Does the patient have adequate social supports and living environment to accommodate these discharge functional goals? Potentially 10. Anticipated D/C setting: Home 11. Anticipated post D/C treatments: HH therapy and Home excercise program 12. Overall Rehab/Functional Prognosis: excellent  RECOMMENDATIONS: This patient's condition is appropriate for continued rehabilitative care in the following setting: Need to clarify if pt's son  will be available 24/7 at discharge.  Likely CIR Patient has agreed to participate in recommended program. Yes Note that insurance prior authorization may be required for reimbursement for recommended care.  Comment: Rehab Admissions Coordinator to follow up.  Delice Lesch, MD 05/26/2015

## 2015-05-26 NOTE — Progress Notes (Signed)
Occupational Therapy Treatment Patient Details Name: Katrina Burnett MRN: [id number] DOB: [birth year] Today's Date: 05/26/2015    History of present illness Pt is an 82 y/o female who presents s/p L intertrochanteric hip fracture.    OT comments  Pt making gradual progress toward OT goals. Pt tearful at start of session secondary to pain and not feeling independent like she used to be. Pt currently requiring min assist +2 for toilet transfers to [place]. Pt min assist for balance in standing with peri care. Pt reports feeling like she can't breath and she is not getting enough O2; SpO2 recorded in 90s on RA throughout session. Continue to recommending CIR for follow up. Will continue to follow acutely.    Follow Up Recommendations  CIR    Equipment Recommendations  3 in 1 bedside comode;Tub/shower seat;Other (comment) (AE)    Recommendations for Other Services      Precautions / Restrictions Precautions Precautions: Fall Restrictions Weight Bearing Restrictions: Yes RLE Weight Bearing: Partial weight bearing RLE Partial Weight Bearing Percentage or Pounds: 50%       Mobility Bed Mobility Overal bed mobility: Needs Assistance Bed Mobility: Supine to Sit     Supine to sit: Min assist     General bed mobility comments: VCs for sequencing and technique.  Transfers Overall transfer level: Needs assistance Equipment used: Rolling walker (2 wheeled) Transfers: Sit to/from Stand Sit to Stand: Min assist;+2 safety/equipment Stand pivot transfers: Min assist;+2 safety/equipment       General transfer comment: Min assist +2 for sit to stand and stand pivot transfer. VCs throughout for sequencing and hand placement.     Balance Overall balance assessment: Needs assistance         Standing balance support: Single extremity supported;During functional activity Standing balance-Leahy Scale: Poor Standing balance comment: Pt able to complete peri care in standing with single  extremity supported and min assist for balance.                   ADL Overall ADL's : Needs assistance/impaired                         Toilet Transfer: Minimal assistance;+2 for safety/equipment;Stand-pivot;BSC;RW;Cueing for sequencing;Cueing for safety Toilet Transfer Details (indicate cue type and reason): Pt required min assist +2 for stand pivot transfer from EOB to chair. Pt reports staff is not coming quickly enough to assist her to [facility] and she is haing to void in bed. Toileting- Clothing Manipulation and Hygiene: Minimal assistance;Sit to/from stand Toileting - Clothing Manipulation Details (indicate cue type and reason): Pt able to complete peri care in standing with min assist for balance in standing.     Functional mobility during ADLs: Minimal assistance;+2 for safety/equipment;Rolling walker General ADL Comments: No family present for OT session. Pt in significant pain this session asking for morning meds and pain meds; RN notified. Pt tearful throughout session stating she felt like she couldnt breath. SpO2 96 on RA prior to activity; 93-99 on RA following functional activity/mobility.       Vision                     Perception     Praxis      Cognition   Behavior During Therapy: [facility] for tasks assessed/performed Overall Cognitive Status: Within Functional Limits for tasks assessed  Extremity/Trunk Assessment               Exercises     Shoulder Instructions       General Comments      Pertinent Vitals/ Pain       Pain Assessment: 0-10 Pain Score: 10-Worst pain ever Pain Location: R hip Pain Descriptors / Indicators: Aching;Constant;Grimacing;Operative site guarding Pain Intervention(s): Limited activity within patient's tolerance;Monitored during session;Repositioned;Patient requesting pain meds-RN notified;Ice applied  Home Living                                           Prior Functioning/Environment              Frequency Min 2X/week     Progress Toward Goals  OT Goals(current goals can now be found in the care plan section)  Progress towards OT goals: Progressing toward goals  Acute Rehab OT Goals Patient Stated Goal: Decrease pain OT Goal Formulation: With patient  Plan Discharge plan remains appropriate    Co-evaluation    PT/OT/SLP Co-Evaluation/Treatment: Yes Reason for Co-Treatment: For patient/therapist safety   OT goals addressed during session: ADL's and self-care      End of Session Equipment Utilized During Treatment: Gait belt;Rolling walker   Activity Tolerance Patient limited by pain   Patient Left in chair;with call bell/phone within reach   Nurse Communication          Time: [id number] OT Time Calculation (min): 27 min  Charges: OT General Charges $OT Visit: 1 Procedure OT Treatments $Self Care/Home Management : 8-22 mins  Binnie Kand M.S., OTR/L Pager: [phone]  05/26/2015, 2:10 PM

## 2015-05-26 NOTE — Care Management Important Message (Signed)
Important Message  Patient Details  Name: Katrina Burnett MRN: [id number] Date of Birth: 05-08-1925   Medicare Important Message Given:  Yes    Takesha Steger P Shantanu Strauch 05/26/2015, 4:04 PM

## 2015-05-26 NOTE — H&P (Signed)
Physical Medicine and Rehabilitation Admission H&P    Chief Complaint  Patient presents with  . Hip Pain  . Fall  : HPI: Katrina Burnett is a 82 y.o. right handed female with history of polymyalgia. Patient lives with her son. He is not home 24/7. Independent prior to admission . One level home with 5 steps to entry. Admitted 05/22/2015 after a fall while walking her dog. She lost her balance and fell on her right side. No loss of consciousness. Sustained a small laceration to the right arm. X-rays and imaging revealed a right hip intertrochanteric fracture 2 part varus displacement. Underwent ORIF 05/23/2015 per Dr. Mayer Camel. 50% weightbearing right lower extremity. Hospital course pain management. Subcutaneous Lovenox for DVT prophylaxis. Acute blood loss anemia 9.6 and monitored. Physical therapy evaluation completed 05/24/2015 with recommendations of physical medicine rehabilitation consult. Patient was admitted for comprehensive rehabilitation program  ROS Constitutional: Negative for fever and chills.  HENT: Negative for hearing loss.  Eyes: Negative for blurred vision and double vision.  Respiratory: Negative for cough and shortness of breath.  Cardiovascular: Negative for chest pain, palpitations and leg swelling.  Gastrointestinal: Positive for constipation. Negative for nausea and vomiting.   GERD  Musculoskeletal: Positive for back pain. Negative for falls.  Skin: Negative for rash.  Neurological: Negative for seizures, loss of consciousness and headaches.  Psychiatric/Behavioral: Positive for depression.  All other systems reviewed and are negative   Past Medical History  Diagnosis Date  . Polymyalgia ([place])   . Lumbar compression fracture ([facility])     L1 and L2  . Allergic rhinitis   . Varicose veins   . GERD (gastroesophageal reflux disease)   . Barrett's esophagus 06/1997  . Osteoporosis   . Chondromalacia   . Pneumonia   . Vaginitis   . Diverticulosis   .  Tubular adenoma of colon 06/1999  . Glaucoma    Past Surgical History  Procedure Laterality Date  . Varicose vein surgery    . Fixation kyphoplasty lumbar spine  10/12/10; 09/20/11  . Lung biopsy     Family History  Problem Relation Age of Onset  . Colon cancer Mother 22  . Congestive Heart Failure Mother    Social History:  reports that she has never smoked. She has never used smokeless tobacco. She reports that she does not drink alcohol or use illicit drugs. Allergies:  Allergies  Allergen Reactions  . Avelox [Moxifloxacin Hcl In Nacl]     Felt bad  . Biaxin [Clarithromycin] Nausea Only  . Cefuroxime Diarrhea  . Levaquin [Levofloxacin In D5w] Diarrhea  . Minocycline Nausea And Vomiting  . Paxil [Paroxetine Hcl] Nausea And Vomiting  . Prednisone Nausea Only  . Remeron [Mirtazapine]     Doesn't remember   . Sulfur Nausea And Vomiting  . Trimox [Amoxicillin] Other (See Comments)    Doesn't remember   . Viibryd [Vilazodone Hcl] Other (See Comments)    Doesn't remember    Medications Prior to Admission  Medication Sig Dispense Refill  . azelastine (ASTELIN) 137 MCG/SPRAY nasal spray Place 1 spray into the nose daily. Use in each nostril as directed    . cetirizine (ZYRTEC) 10 MG tablet Take 10 mg by mouth daily.    . cholecalciferol (VITAMIN D) 1000 UNITS tablet Take 1,000 Units by mouth daily.    . citalopram (CELEXA) 40 MG tablet Take 0.5 tablets (20 mg total) by mouth daily. 30 tablet 2  . dorzolamide (TRUSOPT) 2 % ophthalmic solution  Place 1 drop into both eyes 2 (two) times daily.     . fluconazole (DIFLUCAN) 150 MG tablet Take 1 tablet (150 mg total) by mouth once. As needed for yeast 5 tablet 0  . fluticasone (FLONASE) 50 MCG/ACT nasal spray Place 1 spray into both nostrils daily.     Marland Kitchen ibuprofen (ADVIL,MOTRIN) 200 MG tablet Take 200 mg by mouth every 8 (eight) hours as needed. For pain    . lansoprazole (PREVACID) 30 MG capsule Take 30 mg by mouth daily.    Marland Kitchen  latanoprost (XALATAN) 0.005 % ophthalmic solution Place 1 drop into both eyes at bedtime.    Marland Kitchen LORazepam (ATIVAN) 1 MG tablet Take 1 mg by mouth 2 (two) times daily.  0  . methocarbamol (ROBAXIN) 500 MG tablet Take 500 mg by mouth 2 (two) times daily as needed. Muscle spasms  2  . Multiple Vitamin (MULTIVITAMIN) tablet Take 1 tablet by mouth daily.    . Probiotic Product (ALIGN) 4 MG CAPS Take 4 mg by mouth daily.    . [DISCONTINUED] HYDROcodone-acetaminophen (NORCO/VICODIN) 5-325 MG tablet Take 0.5-1 tablets by mouth 2 (two) times daily as needed. pain  0    Home: Home Living Family/patient expects to be discharged to:: Private residence Living Arrangements: Children Available Help at Discharge: Family, Available 24 hours/day Type of Home: House Home Access: Stairs to enter [employer] of Steps: 5 Entrance Stairs-Rails: Right, Left, Can reach both Home Layout: One level Bathroom Shower/Tub: Multimedia programmer: Standard Bathroom Accessibility: Yes Home Equipment: Environmental consultant - 2 wheels, Shower seat - built in, [place] - single point   Functional History: Prior Function Level of Independence: Independent Comments: Very active at home  Functional Status:  Mobility: Bed Mobility Overal bed mobility: Needs Assistance Bed Mobility: Supine to Sit Supine to sit: Min assist General bed mobility comments: VCs for sequencing and technique. Transfers Overall transfer level: Needs assistance Equipment used: Rolling walker (2 wheeled) Transfers: Sit to/from Stand Sit to Stand: Min assist, +2 safety/equipment Stand pivot transfers: Min assist, +2 safety/equipment General transfer comment: Min assist +2 for sit to stand and stand pivot transfer. VCs throughout for sequencing and hand placement.  Ambulation/Gait General Gait Details: Unable at this time due to pain    ADL: ADL Overall ADL's : Needs assistance/impaired Eating/Feeding: Set up, Sitting Grooming: Set up,  Sitting Lower Body Bathing: Minimal assistance, Sit to/from stand Lower Body Dressing: Minimal assistance, Sit to/from stand Toilet Transfer: Minimal assistance, +2 for safety/equipment, Stand-pivot, BSC, RW, Cueing for sequencing, Cueing for safety Toilet Transfer Details (indicate cue type and reason): Pt required min assist +2 for stand pivot transfer from EOB to chair. Pt reports staff is not coming quickly enough to assist her to [facility] and she is haing to void in bed. Toileting- Clothing Manipulation and Hygiene: Minimal assistance, Sit to/from stand Toileting - Clothing Manipulation Details (indicate cue type and reason): Pt able to complete peri care in standing with min assist for balance in standing. Functional mobility during ADLs: Minimal assistance, +2 for safety/equipment, Rolling walker General ADL Comments: No family present for OT session. Pt in significant pain this session asking for morning meds and pain meds; RN notified. Pt tearful throughout session stating she felt like she couldnt breath. SpO2 96 on RA prior to activity; 93-99 on RA following functional activity/mobility.   Cognition: Cognition Overall Cognitive Status: Within Functional Limits for tasks assessed Orientation Level: Oriented X4 Cognition Arousal/Alertness: Awake/alert Behavior During Therapy: WFL for tasks assessed/performed  Overall Cognitive Status: Within Functional Limits for tasks assessed  Physical Exam: Blood pressure 95/61, pulse 81, temperature 98 F (36.7 C), temperature source Oral, resp. rate 16, SpO2 91 %. Physical Exam Constitutional: She is oriented to person, place, and time. She appears well-developed and well-nourished.  HENT:  Head: Normocephalic and atraumatic.  Eyes: Conjunctivae and EOM are normal.  Neck: Normal range of motion. Neck supple. No thyromegaly present.  Cardiovascular: Normal rate and regular rhythm.  Respiratory: Effort normal and breath sounds normal. No  respiratory distress.  GI: Soft. Bowel sounds are normal. She exhibits no distension.  Musculoskeletal: She exhibits edema and tenderness.  Neurological: She is alert and oriented to person, place, and time. She has normal reflexes.  Sensation intact to light touch Motor: B/L UE, LLE 5/5 proximal distal Right hip flexion limited by pain, knee extension 4/5, ankle dorsi/plantar flexion 5/5  Skin: Skin is warm and dry.  Hip incision clean and dry appropriately tender  Psychiatric: She has a normal mood and affect. Her behavior is normal   Results for orders placed or performed during the hospital encounter of 05/22/15 (from the past 48 hour(s))  CBC     Status: Abnormal   Collection Time: 05/25/15  4:20 AM  Result Value Ref Range   WBC 9.0 4.0 - 10.5 K/uL   RBC 3.66 (L) 3.87 - 5.11 MIL/uL   Hemoglobin 10.8 (L) 12.0 - 15.0 g/dL   HCT 33.5 (L) 36.0 - 46.0 %   MCV 91.5 78.0 - 100.0 fL   MCH 29.5 26.0 - 34.0 pg   MCHC 32.2 30.0 - 36.0 g/dL   RDW 13.2 11.5 - 15.5 %   Platelets 231 150 - 400 K/uL  Basic metabolic panel     Status: None   Collection Time: 05/25/15  4:20 AM  Result Value Ref Range   Sodium 137 135 - 145 mmol/L   Potassium 4.3 3.5 - 5.1 mmol/L   Chloride 103 101 - 111 mmol/L   CO2 27 22 - 32 mmol/L   Glucose, Bld 95 65 - 99 mg/dL   BUN 8 6 - 20 mg/dL   Creatinine, Ser 0.79 0.44 - 1.00 mg/dL   Calcium 9.1 8.9 - 10.3 mg/dL   GFR calc non Af Amer >60 >60 mL/min   GFR calc Af Amer >60 >60 mL/min    Comment: (NOTE) The eGFR has been calculated using the CKD EPI equation. This calculation has not been validated in all clinical situations. eGFR's persistently <60 mL/min signify possible Chronic Kidney Disease.    Anion gap 7 5 - 15  CBC     Status: Abnormal   Collection Time: 05/26/15  4:58 AM  Result Value Ref Range   WBC 7.8 4.0 - 10.5 K/uL   RBC 3.38 (L) 3.87 - 5.11 MIL/uL   Hemoglobin 9.6 (L) 12.0 - 15.0 g/dL   HCT 30.5 (L) 36.0 - 46.0 %   MCV 90.2 78.0 - 100.0  fL   MCH 28.4 26.0 - 34.0 pg   MCHC 31.5 30.0 - 36.0 g/dL   RDW 13.3 11.5 - 15.5 %   Platelets 255 150 - 400 K/uL   No results found.   Medical Problem List and Plan: 1.  Gait instability secondary to right intertrochanteric hip fracture. Status post ORIF. 50% weightbearing. 2.  DVT Prophylaxis/Anticoagulation: Lovenox. Monitor platelet counts of any signs of bleeding. Check vascular study. 3. Pain Management/polymyalgia: Hydrocodone and Robaxin as needed. Monitor with increased mobility 4. Mood: Celexa  20 mg daily, Ativan 1 mg twice a day. Provide emotional support 5. Neuropsych: This patient is capable of making decisions on her own behalf. 6. Skin/Wound Care: Routine skin checks 7. Fluids/Electrolytes/Nutrition: Routine eye and nose with follow-up chemistries 8. Acute blood loss anemia. Follow-up CBC  Post Admission Physician Evaluation: 1. Functional deficits secondary  to right intertrochanteric hip fracture. 2. Patient is admitted to receive collaborative, interdisciplinary care between the physiatrist, rehab nursing staff, and therapy team. 3. Patient's level of medical complexity and substantial therapy needs in context of that medical necessity cannot be provided at a lesser intensity of care such as a SNF. 4. Patient has experienced substantial functional loss from his/her baseline which was documented above under the "Functional History" and "Functional Status" headings.  Judging by the patient's diagnosis, physical exam, and functional history, the patient has potential for functional progress which will result in measurable gains while on inpatient rehab.  These gains will be of substantial and practical use upon discharge  in facilitating mobility and self-care at the household level. 5. Physiatrist will provide 24 hour management of medical needs as well as oversight of the therapy plan/treatment and provide guidance as appropriate regarding the interaction of the two. 6. 24  hour rehab nursing will assist with skin/wound care, disease management, medication administration, pain management and patient education and help integrate therapy concepts, techniques,education, etc. 7. PT will assess and treat for/with: Lower extremity strength, range of motion, stamina, balance, functional mobility, safety, adaptive techniques and equipment, woundcare, coping skills, pain control, education.   Goals are: Supervision/Mod I. 8. OT will assess and treat for/with: ADL's, functional mobility, safety, upper extremity strength, adaptive techniques and equipment, wound mgt, ego support, and community reintegration.   Goals are: Supervision/Mod I. Therapy may not proceed with showering this patient. 9. Case Management and Social Worker will assess and treat for psychological issues and discharge planning. 10. Team conference will be held weekly to assess progress toward goals and to determine barriers to discharge. 11. Patient will receive at least 3 hours of therapy per day at least 5 days per week. 12. ELOS: 7-10 days.       13. Prognosis:  excellent  Delice Lesch, MD 05/26/2015

## 2015-05-26 NOTE — Progress Notes (Signed)
I have discussed with pt, pt's dtr, [name] and sons Josph Macho and [name]. All are in agreement to admit pt to inpt rehab today. I contacted Dr. Algis Liming and he is also in agreement. I will make the arrangements to admit pt later today. RN CM and SW made aware. [phone]

## 2015-05-26 NOTE — Progress Notes (Signed)
I met with pt at bedside with her daughter, [name], at bedside. We discussed rehab venue options of CIR vs SNF rehab and expectations for intensity of therapy and 24/7 caregiver support once home. Family of three sons and 1 daughter to discuss and let me know their preference to pursue. I discussed with SW. [phone]

## 2015-05-27 ENCOUNTER — Inpatient Hospital Stay (HOSPITAL_COMMUNITY): Payer: Medicare Other | Admitting: Physical Therapy

## 2015-05-27 ENCOUNTER — Inpatient Hospital Stay (HOSPITAL_COMMUNITY): Payer: Medicare Other

## 2015-05-27 ENCOUNTER — Inpatient Hospital Stay (HOSPITAL_COMMUNITY): Payer: PRIVATE HEALTH INSURANCE | Admitting: Occupational Therapy

## 2015-05-27 ENCOUNTER — Inpatient Hospital Stay (HOSPITAL_COMMUNITY): Payer: Medicare Other | Admitting: Occupational Therapy

## 2015-05-27 DIAGNOSIS — F411 Generalized anxiety disorder: Secondary | ICD-10-CM

## 2015-05-27 DIAGNOSIS — R609 Edema, unspecified: Secondary | ICD-10-CM

## 2015-05-27 LAB — CBC WITH DIFFERENTIAL/PLATELET
BASOS PCT: 0 %
Basophils Absolute: 0 10*3/uL (ref 0.0–0.1)
EOS ABS: 0.5 10*3/uL (ref 0.0–0.7)
Eosinophils Relative: 8 %
HEMATOCRIT: 31.6 % — AB (ref 36.0–46.0)
HEMOGLOBIN: 10.3 g/dL — AB (ref 12.0–15.0)
Lymphocytes Relative: 22 %
Lymphs Abs: 1.4 10*3/uL (ref 0.7–4.0)
MCH: 29.6 pg (ref 26.0–34.0)
MCHC: 32.6 g/dL (ref 30.0–36.0)
MCV: 90.8 fL (ref 78.0–100.0)
MONO ABS: 0.6 10*3/uL (ref 0.1–1.0)
MONOS PCT: 9 %
Neutro Abs: 4 10*3/uL (ref 1.7–7.7)
Neutrophils Relative %: 61 %
Platelets: 274 10*3/uL (ref 150–400)
RBC: 3.48 MIL/uL — ABNORMAL LOW (ref 3.87–5.11)
RDW: 13.4 % (ref 11.5–15.5)
WBC: 6.6 10*3/uL (ref 4.0–10.5)

## 2015-05-27 LAB — COMPREHENSIVE METABOLIC PANEL
ALBUMIN: 2.5 g/dL — AB (ref 3.5–5.0)
ALK PHOS: 65 U/L (ref 38–126)
ALT: 22 U/L (ref 14–54)
ANION GAP: 8 (ref 5–15)
AST: 36 U/L (ref 15–41)
BILIRUBIN TOTAL: 0.4 mg/dL (ref 0.3–1.2)
BUN: 12 mg/dL (ref 6–20)
CALCIUM: 8.9 mg/dL (ref 8.9–10.3)
CO2: 27 mmol/L (ref 22–32)
Chloride: 102 mmol/L (ref 101–111)
Creatinine, Ser: 0.82 mg/dL (ref 0.44–1.00)
GLUCOSE: 97 mg/dL (ref 65–99)
Potassium: 3.5 mmol/L (ref 3.5–5.1)
Sodium: 137 mmol/L (ref 135–145)
TOTAL PROTEIN: 5.4 g/dL — AB (ref 6.5–8.1)

## 2015-05-27 NOTE — Patient Care Conference (Signed)
Inpatient RehabilitationTeam Conference and Plan of Care Update Date: 05/27/2015   Time: 2:10 PM    Patient Name: Katrina Burnett      Medical Record Number: [id number]  Date of Birth: March 05, 1925 Sex: Female         Room/Bed: 4W19C/4W19C-01 Payor Info: Payor: MEDICARE / Plan: MEDICARE PART A AND B / Product Type: *No Product type* /    Admitting Diagnosis: hip fx  Admit Date/Time:  05/26/2015  6:21 PM Admission Comments: No comment available   Primary Diagnosis:  <principal problem not specified> Principal Problem: <principal problem not specified>  Patient Active Problem List   Diagnosis Date Noted  . Fracture, intertrochanteric, right femur ([place]) 05/26/2015  . Closed fracture of femur ([place])   . Post-operative pain   . Acute blood loss anemia   . Adjustment disorder with mixed anxiety and depressed mood   . Falls   . Abnormality of gait   . Femur fracture, right, closed, initial encounter 05/23/2015  . Fracture of femoral neck, right, closed 05/22/2015  . Femoral neck fracture, right, closed, initial encounter 05/22/2015  . Syncope 05/30/2012  . Nausea & vomiting 05/30/2012  . Chest pain 05/30/2012  . Hypotension 05/30/2012  . Esophageal reflux 12/16/2011  . Lumbar compression fracture ([place])   . Polymyalgia [facility])     Expected Discharge Date: Expected Discharge Date: 06/10/15  Team Members Present: Physician leading conference: Dr. Alger Simons Social Worker Present: Lennart Pall, LCSW Nurse Present: Heather Roberts, RN PT Present: Canary Brim, PT OT Present: Napoleon Form, OT SLP Present: Weston Anna, SLP PPS Coordinator present : Daiva Nakayama, RN, CRRN     Current Status/Progress Goal Weekly Team Focus  Medical   right intertrochanteric hip fracture, polymyalgia, severe anxiety issues. pain  control anxiety for better therapy participation  anxiety control, wound care, sleep restoration   Bowel/Bladder   Incontinent of bladder x 1 during the noc. Continent of bowel. LBM  05/26/15  Pt to remain continent of bladder  Initiate time toileting q 2-3hrs   Swallow/Nutrition/ Hydration             ADL's   Min-mod functional transfers; min -total toileting depending on pain level  Mod I overall with supervision for shower transfers  Pain management, activity tolerance, ADL re-training   Mobility   min to mod assist overall; limited evaluation due to pain, anxiety, and dizziness  mod I to supervision overall for household distances for gait and transfers; min assist for stairs and car  pain management, transfers, gait training, endurance, strengthening, stair negotiation, balance   Communication             Safety/Cognition/ Behavioral Observations            Pain   Vicodin 1-2 tabs prn q 6hrs. Robaxin 500mg  q 6hrs prn  <4  Encourage therapy session to increase tolerance, and decrease need for pain   Skin   Skin tear to R elbow, R knee proximal and  lateral. Hip incision w/dermabond and Mepilex dressing  No additional skin breakdown  Assess skin q shift.  Monitor for approximate healing    Rehab Goals Patient on target to meet rehab goals: Yes *See Care Plan and progress notes for long and short-term goals.  Barriers to Discharge: anxiety, pain issues    Possible Resolutions to Barriers:  neuropsych eval, ego support, pain control    Discharge Planning/Teaching Needs:  home with family to provide 24/7 assistance (daughter to stay)  to schedule  prior to d/c.   Team Discussion:  New eval, hip fx, polymyalgia.  Tearful and anxious with MD this morning - very dramatic.  Limited participation with evals.  Therapists are setting mod i goals, however, dependent on her participation overall.  Revisions to Treatment Plan:  None   Continued Need for Acute Rehabilitation Level of Care: The patient requires daily medical management by a physician with specialized training in physical medicine and rehabilitation for the following conditions: Daily direction of a  multidisciplinary physical rehabilitation program to ensure safe treatment while eliciting the highest outcome that is of practical value to the patient.: Yes Daily medical management of patient stability for increased activity during participation in an intensive rehabilitation regime.: Yes Daily analysis of laboratory values and/or radiology reports with any subsequent need for medication adjustment of medical intervention for : Post surgical problems;Mood/behavior problems;Other  Angeni Chaudhuri 05/28/2015, 10:50 AM

## 2015-05-27 NOTE — Progress Notes (Signed)
[facility] PHYSICAL MEDICINE & REHABILITATION     PROGRESS NOTE    Subjective/Complaints: Highly anxious. "nobody's helping me". "I'm all alone". "my meds are not being given like they should be". Often in tears---was fine when a friend was in the room talking to her prior to my entering  ROS: Pt denies fever, rash/itching, headache, blurred or double vision, nausea, vomiting, abdominal pain, diarrhea, chest pain, shortness of breath, palpitations, dysuria, dizziness, +anxiety, pain   Objective: Vital Signs: Blood pressure 125/83, pulse 92, temperature 98.5 F (36.9 C), temperature source Oral, resp. rate 18, height 4\' 9"  (1.448 m), weight 46.5 kg (102 lb 8.2 oz), SpO2 95 %. No results found.  Recent Labs  05/26/15 0458 05/27/15 0615  WBC 7.8 6.6  HGB 9.6* 10.3*  HCT 30.5* 31.6*  PLT 255 274    Recent Labs  05/25/15 0420 05/27/15 0615  NA 137 137  K 4.3 3.5  CL 103 102  GLUCOSE 95 97  BUN 8 12  CREATININE 0.79 0.82  CALCIUM 9.1 8.9   CBG (last 3)  No results for input(s): GLUCAP in the last 72 hours.  Wt Readings from Last 3 Encounters:  05/26/15 46.5 kg (102 lb 8.2 oz)  08/14/13 43.999 kg (97 lb)  10/05/12 42.638 kg (94 lb)    Physical Exam:  Constitutional: She is oriented to person, place, and time. She appears well-developed and well-nourished.  HENT:  Head: Normocephalic and atraumatic.  Eyes: Conjunctivae and EOM are normal.  Neck: Normal range of motion. Neck supple. No thyromegaly present.  Cardiovascular: Normal rate and regular rhythm.  Respiratory: Effort normal and breath sounds normal. No respiratory distress.  GI: Soft. Bowel sounds are normal. She exhibits no distension.  Musculoskeletal: She exhibits edema and tenderness.  Neurological: She is alert and oriented to person, place, and time. She has normal reflexes.  Sensation intact to light touch Motor: B/L UE, LLE 5/5 proximal distal Right hip flexion remains limited by pain, knee  extension 4/5, ankle dorsi/plantar flexion 5/5  Skin: Skin is warm and dry.  Hip incision clean and dry with foam dressing---dressing re-placed after exam Psychiatric: anxious and tearful throughout visit   Assessment/Plan: 1. Gait instability and functional deficits secondary to right IT hip fx which require 3+ hours per day of interdisciplinary therapy in a comprehensive inpatient rehab setting. Physiatrist is providing close team supervision and 24 hour management of active medical problems listed below. Physiatrist and rehab team continue to assess barriers to discharge/monitor patient progress toward functional and medical goals.  Function:  Bathing Bathing position      Bathing parts      Bathing assist        Upper Body Dressing/Undressing Upper body dressing                    Upper body assist        Lower Body Dressing/Undressing Lower body dressing                                  Lower body assist        Toileting Toileting          Toileting assist     Transfers Chair/bed Clinical biochemist  Cognition Comprehension Comprehension assist level: Follows basic conversation/direction with no assist  Expression Expression assist level: Expresses basic needs/ideas: With no assist  Social Interaction Social Interaction assist level: Interacts appropriately 90% of the time - Needs monitoring or encouragement for participation or interaction.  Problem Solving Problem solving assist level: Solves basic problems with no assist  Memory Memory assist level: More than reasonable amount of time   Medical Problem List and Plan: 1. Gait instability secondary to right intertrochanteric hip fracture. Status post ORIF. 50% weightbearing.  -beginning CIR therapies 2. DVT Prophylaxis/Anticoagulation: Lovenox. Monitor platelet counts of any signs of bleeding. Check vascular study. 3.  Pain Management/polymyalgia: Hydrocodone and Robaxin as needed. May need to make adjustments with meds 4. Mood: Celexa 20 mg daily, Ativan 1 mg twice a day per home regimen  -needs immense emotional support  -will ask neuropsych to see.   5. Neuropsych: This patient is capable of making decisions on her own behalf. 6. Skin/Wound Care: Routine skin checks 7. Fluids/Electrolytes/Nutrition: Routine I's and O's with follow-up chemistries personally reviewed by me today.   -encourage PO 8. Acute blood loss anemia. Follow up hgb stable and back to 10.3 today. No signs of blood loss clinically  LOS (Days) 1 A FACE TO FACE EVALUATION WAS PERFORMED  Shelbylynn Walczyk T 05/27/2015 10:03 AM

## 2015-05-27 NOTE — Progress Notes (Signed)
Physical Therapy Session Note  Patient Details  Name: Katrina Burnett MRN: [id number] Date of Birth: [birth year]  Today's Date: 05/27/2015 PT Individual Time: [id number] PT Individual Time Calculation (min): 30 min   Short Term Goals: Week 1:  PT Short Term Goal 1 (Week 1): Pt will perform bed mobility from flat surface with supervision PT Short Term Goal 2 (Week 1): Pt will be able to perform basic transfers with supervision PT Short Term Goal 3 (Week 1): Pt will be able to perform stair negotiation with mod assist to prepare for home entry PT Short Term Goal 4 (Week 1): Pt will be able to gait x 50' with supervision  Skilled Therapeutic Interventions/Progress Updates:   Patient sitting in recliner, daughter and niece present throughout session staying in background and supportive. With increased time, patient ambulated x 5 ft using RW to wheelchair with min A overall. Patient ambulated from wheelchair <> car about 5 ft using RW with min A and performed simulated car transfer to sedan height using RW with mod A to bring BLE into car. Patient attempted negotiating up/down one 3" step using 2 rails with max A but unable to complete step due to increased R hip pain. Patient returned to room and left sitting in wheelchair with family present. Discussed with daughter and RN possible need for scheduled pain medication as patient currently unable to keep track of when to request pain medications. Patient upset with therapist throughout session for "making me do these terrible things," and stating, "This isn't going to help me!" Education provided on goals and purpose of inpatient rehab and functional mobility training.   Therapy Documentation Precautions:  Precautions Precautions: Fall Restrictions Weight Bearing Restrictions: Yes RLE Weight Bearing: Partial weight bearing RLE Partial Weight Bearing Percentage or Pounds: 50% Pain: Pain Assessment Pain Assessment: Faces Pain Score: 9  Faces Pain  Scale: Hurts whole lot Pain Type: Surgical pain Pain Location: Hip Pain Orientation: Right Pain Descriptors / Indicators: Aching;Discomfort;Grimacing Pain Frequency: Constant Pain Onset: On-going Pain Intervention(s): Repositioned;Emotional support;Rest;RN made aware (premedicated)  See Function Navigator for Current Functional Status.   Therapy/Group: Individual Therapy  Laretta Alstrom 05/27/2015, 2:43 PM

## 2015-05-27 NOTE — Evaluation (Signed)
Physical Therapy Assessment and Plan  Patient Details  Name: Katrina Burnett MRN: [id number] Date of Birth: 1924-07-21  PT Diagnosis: Difficulty walking, Dizziness and giddiness, Edema, Muscle weakness and Pain in RLE Rehab Potential: Good ELOS: 14 days   Today's Date: 05/27/2015 PT Individual Time: 7793-9030 PT Individual Time Calculation (min): 60 min    Problem List:  Patient Active Problem List   Diagnosis Date Noted  . Fracture, intertrochanteric, right femur ([place]) 05/26/2015  . Closed fracture of femur ([place])   . Post-operative pain   . Acute blood loss anemia   . Adjustment disorder with mixed anxiety and depressed mood   . Falls   . Abnormality of gait   . Femur fracture, right, closed, initial encounter 05/23/2015  . Fracture of femoral neck, right, closed 05/22/2015  . Femoral neck fracture, right, closed, initial encounter 05/22/2015  . Syncope 05/30/2012  . Nausea & vomiting 05/30/2012  . Chest pain 05/30/2012  . Hypotension 05/30/2012  . Esophageal reflux 12/16/2011  . Lumbar compression fracture ([place])   . Polymyalgia [facility])     Past Medical History:  Past Medical History  Diagnosis Date  . Polymyalgia ([place])   . Lumbar compression fracture ([facility])     L1 and L2  . Allergic rhinitis   . Varicose veins   . GERD (gastroesophageal reflux disease)   . Barrett's esophagus 06/1997  . Osteoporosis   . Chondromalacia   . Pneumonia   . Vaginitis   . Diverticulosis   . Tubular adenoma of colon 06/1999  . Glaucoma    Past Surgical History:  Past Surgical History  Procedure Laterality Date  . Varicose vein surgery    . Fixation kyphoplasty lumbar spine  10/12/10; 09/20/11  . Lung biopsy    . Orif hip fracture Right 05/23/2015    Procedure: OPEN REDUCTION INTERNAL FIXATION HIP;  Surgeon: Frederik Pear, MD;  Location: [place];  Service: Orthopedics;  Laterality: Right;    Assessment & Plan Clinical Impression: Patient is an 82 y.o. right handed female with history of  polymyalgia. Patient lives with her son. He is not home 24/7. Independent prior to admission . One level home with 5 steps to entry. Admitted 05/22/2015 after a fall while walking her dog. She lost her balance and fell on her right side. No loss of consciousness. Sustained a small laceration to the right arm. X-rays and imaging revealed a right hip intertrochanteric fracture 2 part varus displacement. Underwent ORIF 05/23/2015 per Dr. Mayer Camel. 50% weightbearing right lower extremity.  Patient transferred to CIR on 05/26/2015 .   Patient currently requires min to mod assist with mobility secondary to muscle weakness, muscle joint tightness and pain, decreased cardiorespiratoy endurance and decreased sitting balance, decreased standing balance, decreased balance strategies and difficulty maintaining precautions.  Prior to hospitalization, patient was independent  with mobility and lived with Other (Comment) (Son, [name], lives with pt due to his issues/needs) in a House home.  Home access is 5Stairs to enter.  Patient will benefit from skilled PT intervention to maximize safe functional mobility, minimize fall risk and decrease caregiver burden for planned discharge home with 24 hour supervision.  Anticipate patient will benefit from follow up [place] at discharge.  PT - End of Session Activity Tolerance: Tolerates 30+ min activity with multiple rests (pt became lightheaded in bathroom) Endurance Deficit: Yes Endurance Deficit Description: very limited by pain. Pt lightheaded and dizzy during session and anxious PT Assessment Rehab Potential (ACUTE/IP ONLY): Good Barriers to Discharge:  Inaccessible home environment PT Patient demonstrates impairments in the following area(s): Balance;Edema;Endurance;Motor;Pain;Skin Integrity PT Transfers Functional Problem(s): Bed Mobility;Bed to Chair;Car;Furniture PT Locomotion Functional Problem(s): Ambulation;Wheelchair Mobility;Stairs PT Plan PT Intensity: Minimum of 1-2  x/day ,45 to 90 minutes PT Frequency: 5 out of 7 days PT Duration Estimated Length of Stay: 14 days PT Treatment/Interventions: Ambulation/gait training;Balance/vestibular training;Community reintegration;Discharge planning;Disease management/prevention;DME/adaptive equipment instruction;Functional mobility training;Neuromuscular re-education;Pain management;Patient/family education;Psychosocial support;Skin care/wound management;Stair training;Therapeutic Activities;Therapeutic Exercise;UE/LE Strength taining/ROM;UE/LE Coordination activities;Wheelchair propulsion/positioning PT Transfers Anticipated Outcome(s): mod I basic transfers; Supervision car PT Locomotion Anticipated Outcome(s): mod I short distance gait in household, mod I w/c mobility for longer distances PT Recommendation Follow Up Recommendations: Home health PT Patient destination: Home Equipment Recommended: Wheelchair (measurements);Wheelchair cushion (measurements) Equipment Details: pt already has RW  Skilled Therapeutic Intervention Individual treatment initiated with focus on orientation to rehab, education on PT goals, functional transfer training including OOB and on/off toilet, endurance and activity tolerance, w/c positioning and adjustment of legrests for comfort and positioning, and w/c propulsion training with BUE for functional mobility and strengthening and endurance. Pt very limited during session by pain and becoming lightheaded in bathroom (suspect from pain and warm nature of room) using cool wash cloth and drinking soda to aid with symptoms (BP WFL see below). Handoff to OT in hallway.  PT Evaluation Precautions/Restrictions Precautions Precautions: Fall Restrictions Weight Bearing Restrictions: Yes RLE Weight Bearing: Partial weight bearing RLE Partial Weight Bearing Percentage or Pounds: 50%    Vital Signs Therapy Vitals BP: 125/83 mmHg Patient Position (if appropriate): Sitting Pain  8/10 pain in R  hip - RN made aware and medication given Home Living/Prior Functioning Home Living Available Help at Discharge: Available 24 hours/day;Family (Dtr [name] to provide 24/7 care at d/c. [name] is unable) Type of Home: House Home Access: Stairs to enter [employer] of Steps: 5 Entrance Stairs-Rails: Can reach both Home Layout: One level Additional Comments: Front entrance has 2-3 steps with rails  Lives With: Other (Comment) (Son, [name], lives with pt due to his issues/needs) Prior Function Level of Independence: Independent with basic ADLs;Independent with homemaking with ambulation;Independent with gait;Independent with transfers  Able to Take Stairs?: Yes Driving: Yes Comments: very active PTA; no AD, driving, and taking care of Dog, Susie Cognition Overall Cognitive Status: Within Functional Limits for tasks assessed Safety/Judgment: Appears intact Sensation Sensation Light Touch: Appears Intact Coordination Gross Motor Movements are Fluid and Coordinated: Yes (limited by pain and post op weakness) Motor  Motor Motor: Other (comment) Motor - Skilled Clinical Observations: generalized weakness and pain inhibiting     Trunk/Postural Assessment  Cervical Assessment Cervical Assessment: Within Functional Limits Thoracic Assessment Thoracic Assessment: Exceptions to [facility] (flexed posture) Lumbar Assessment Lumbar Assessment: Exceptions to [facility] (decreased weightbearing on R hip in seated; posterior tilt)  Balance Balance Balance Assessed: Yes Static Sitting Balance Static Sitting - Level of Assistance: 5: Stand by assistance Dynamic Sitting Balance Dynamic Sitting - Level of Assistance: 4: Min assist Sitting balance - Comments: limiting dynamically due to pain Static Standing Balance Static Standing - Level of Assistance: 4: Min assist Dynamic Standing Balance Dynamic Standing - Level of Assistance: 4: Min assist;3: Mod assist Extremity Assessment   See OT evaluation  for UE details   RLE Assessment RLE Assessment: Exceptions to [facility] RLE Strength RLE Overall Strength Comments: limited by pain and post op weakness in hip; knee and ankle grossly 3+/5 LLE Assessment LLE Assessment: Within Functional Limits (decreased muscular endurance)   See Function Navigator for Current  Functional Status.   Refer to Care Plan for Long Term Goals  Recommendations for other services: None  Discharge Criteria: Patient will be discharged from PT if patient refuses treatment 3 consecutive times without medical reason, if treatment goals not met, if there is a change in medical status, if patient makes no progress towards goals or if patient is discharged from hospital.  The above assessment, treatment plan, treatment alternatives and goals were discussed and mutually agreed upon: by patient  Juanna Cao, PT, DPT  05/27/2015, 11:57 AM

## 2015-05-27 NOTE — Plan of Care (Signed)
Problem: RH PAIN MANAGEMENT Goal: RH STG PAIN MANAGED AT OR BELOW PT'S PAIN GOAL <3  Outcome: Not Progressing Reports pain as 9

## 2015-05-27 NOTE — Progress Notes (Signed)
Occupational Therapy Session Note  Patient Details  Name: Katrina Burnett MRN: [id number] Date of Birth: Oct 25, 1924  Today's Date: 05/27/2015 OT Individual Time: 1320-1400 OT Individual Time Calculation (min): 40 min    Short Term Goals: Week 1:  OT Short Term Goal 1 (Week 1): Pt will complete stand pivot transfer to toilet with supervision OT Short Term Goal 2 (Week 1): Pt will complete 3/3 toileting tasks with steadying assist OT Short Term Goal 3 (Week 1): Pt will tolerate full bathing/dressing session with no more than 3 rest breaks OT Short Term Goal 4 (Week 1): Pt will complete bathing task with min A  Skilled Therapeutic Interventions/Progress Updates:  Upon entering the room, pt seated in wheelchair with B LEs elevated and finishing lunch. Pt reporting 10/10 pain in R hip this session. RN notified and bring medication during this session. OT providing education to pt regarding pain management and to call for pain meds when needs. RN also writing due time for medications on dry erase board to assist pt when knowing when they would be due next. Pt continues to state, "It hurts so bad. Just put me on a towel so I can pee." OT educating pt on OT purpose and goals. OT propelled wheelchair into bathroom with pt performing stand pivot transfer with lifting assistance onto standard toilet. Pt began crying and refused to don/doff clothing and when asked to perform hygiene stated, "I can't! I can't " Pt transferred from wheelchair >recliner chair with min A for balance. B LEs elevated and call bell within reach. OT assisted pt in donning B compression socks. Her daughter entered room and OT educated daughter on OT purpose, POC, and goals as well before exiting the room.   Therapy Documentation Precautions:  Precautions Precautions: Fall Restrictions Weight Bearing Restrictions: Yes RLE Weight Bearing: Partial weight bearing RLE Partial Weight Bearing Percentage or Pounds:  50% General: General Chart Reviewed: Yes Vital Signs: Therapy Vitals Temp: 98.1 F (36.7 C) Temp Source: Oral Pulse Rate: 92 Resp: 18 BP: 109/61 mmHg Patient Position (if appropriate): Sitting Oxygen Therapy SpO2: 96 % O2 Device: Not Delivered Pain: Pain Assessment Pain Assessment: Faces Pain Score: 9  Faces Pain Scale: Hurts whole lot Pain Type: Surgical pain Pain Location: Hip Pain Orientation: Right Pain Descriptors / Indicators: Aching;Discomfort;Grimacing Pain Frequency: Constant Pain Onset: On-going Pain Intervention(s): Repositioned;Emotional support;Rest;RN made aware (premedicated)  See Function Navigator for Current Functional Status.   Therapy/Group: Individual Therapy  Phineas Semen 05/27/2015, 3:17 PM

## 2015-05-27 NOTE — Evaluation (Signed)
Occupational Therapy Assessment and Plan  Patient Details  Name: Katrina Burnett MRN: [id number] Date of Birth: [birth year]  OT Diagnosis: acute pain, muscle weakness (generalized) and pain in joint Rehab Potential: Rehab Potential (ACUTE ONLY): Good ELOS: 14 days   Today's Date: 05/27/2015 OT Individual Time: 1027-2536 OT Individual Time Calculation (min): 30 min    OT Missed Time: 40 min and 30 min (pain)  Problem List:  Patient Active Problem List   Diagnosis Date Noted  . Fracture, intertrochanteric, right femur ([place]) 05/26/2015  . Closed fracture of femur ([place])   . Post-operative pain   . Acute blood loss anemia   . Adjustment disorder with mixed anxiety and depressed mood   . Falls   . Abnormality of gait   . Femur fracture, right, closed, initial encounter 05/23/2015  . Fracture of femoral neck, right, closed 05/22/2015  . Femoral neck fracture, right, closed, initial encounter 05/22/2015  . Syncope 05/30/2012  . Nausea & vomiting 05/30/2012  . Chest pain 05/30/2012  . Hypotension 05/30/2012  . Esophageal reflux 12/16/2011  . Lumbar compression fracture ([place])   . Polymyalgia [facility])     Past Medical History:  Past Medical History  Diagnosis Date  . Polymyalgia ([place])   . Lumbar compression fracture ([facility])     L1 and L2  . Allergic rhinitis   . Varicose veins   . GERD (gastroesophageal reflux disease)   . Barrett's esophagus 06/1997  . Osteoporosis   . Chondromalacia   . Pneumonia   . Vaginitis   . Diverticulosis   . Tubular adenoma of colon 06/1999  . Glaucoma    Past Surgical History:  Past Surgical History  Procedure Laterality Date  . Varicose vein surgery    . Fixation kyphoplasty lumbar spine  10/12/10; 09/20/11  . Lung biopsy    . Orif hip fracture Right 05/23/2015    Procedure: OPEN REDUCTION INTERNAL FIXATION HIP;  Surgeon: Frederik Pear, MD;  Location: [place];  Service: Orthopedics;  Laterality: Right;    Assessment & Plan Clinical Impression: Katrina Burnett is a 82 y.o. right handed female with history of polymyalgia. Patient lives with her son. He is not home 24/7. Independent prior to admission . One level home with 5 steps to entry. Admitted 05/22/2015 after a fall while walking her dog. She lost her balance and fell on her right side. No loss of consciousness. Sustained a small laceration to the right arm. X-rays and imaging revealed a right hip intertrochanteric fracture 2 part varus displacement. Underwent ORIF 05/23/2015 per Dr. Mayer Camel. 50% weightbearing right lower extremity. Hospital course pain management. Subcutaneous Lovenox for DVT prophylaxis. Acute blood loss anemia 9.6 and monitored. Physical therapy evaluation completed 05/24/2015 with recommendations of physical medicine rehabilitation consult. Patient was admitted for comprehensive rehabilitation program. Patient transferred to CIR on 05/26/2015 .    Patient currently requires mod with basic self-care skills secondary to muscle weakness, decreased cardiorespiratoy endurance and decreased standing balance, decreased postural control, decreased balance strategies and difficulty maintaining precautions.  Prior to hospitalization, patient could complete ADLs/ IADLs with independent .  Patient will benefit from skilled intervention to decrease level of assist with basic self-care skills, increase independence with basic self-care skills and increase level of independence with iADL prior to discharge home with care partner.  Anticipate patient will require 24 hour supervision and follow up home health.  OT - End of Session Activity Tolerance: Decreased this session Endurance Deficit: Yes Endurance Deficit Description: very limited  by pain. Unable to participate in OT tx session, eval onlyy OT Assessment Rehab Potential (ACUTE ONLY): Good OT Patient demonstrates impairments in the following area(s): Balance;Endurance;Safety;Pain OT Basic ADL's Functional Problem(s):  Grooming;Bathing;Dressing;Toileting OT Advanced ADL's Functional Problem(s): Laundry OT Transfers Functional Problem(s): Toilet;Tub/Shower OT Additional Impairment(s): None OT Plan OT Intensity: Minimum of 1-2 x/day, 45 to 90 minutes OT Frequency: 5 out of 7 days OT Duration/Estimated Length of Stay: 14 days OT Treatment/Interventions: Balance/vestibular training;Discharge planning;Community reintegration;DME/adaptive equipment instruction;Functional mobility training;Pain management;Patient/family education;Self Care/advanced ADL retraining;Therapeutic Activities;Therapeutic Exercise;UE/LE Strength taining/ROM;UE/LE Coordination activities OT Self Feeding Anticipated Outcome(s): Independent OT Basic Self-Care Anticipated Outcome(s): Mod I OT Toileting Anticipated Outcome(s): Mod I OT Bathroom Transfers Anticipated Outcome(s): Supervision OT Recommendation Patient destination: Home Follow Up Recommendations: Home health OT Equipment Recommended: To be determined   Skilled Therapeutic Intervention Session One: Pt seen for OT eval. Pt received in hallway with hand off from PT. She was voicing 10/10 pain in R hip and requesting return to bed. Pt taken back to room and min A stand pivot transfer completed back to bed. Pt able to participate in OT eval, however, unable to complete any functional mobility or ADL tasks due to pain and fatigue. Pt left in supine at end of session, all needs in reach. RN made aware.  Therapist returned ~1 hour later. Pt cont to voice 10/10 pain in hip and fatigue. Left in supine all needs in reach.   OT Evaluation Precautions/Restrictions  Precautions Precautions: Fall Restrictions Weight Bearing Restrictions: Yes RLE Weight Bearing: Partial weight bearing RLE Partial Weight Bearing Percentage or Pounds: 50% General Chart Reviewed: Yes Pain Pain Assessment Pain Assessment: Faces Pain Score: 9  Faces Pain Scale: Hurts whole lot Pain Type: Surgical  pain Pain Location: Hip Pain Orientation: Right Pain Descriptors / Indicators: Aching;Discomfort;Grimacing Pain Frequency: Constant Pain Onset: On-going Pain Intervention(s): Repositioned;Emotional support;Rest;RN made aware (premedicated) Home Living/Prior Functioning Home Living Available Help at Discharge: Available 24 hours/day, Family Type of Home: House Home Access: Stairs to enter [employer] of Steps: 5 Entrance Stairs-Rails: Can reach both Home Layout: One level Bathroom Shower/Tub: Tub/shower unit, Multimedia programmer: Standard Bathroom Accessibility: Yes Additional Comments: Front entrance has 2-3 steps with rails  Lives With: Other (Comment) (Son lives with her) IADL History Homemaking Responsibilities: Yes Current License: Yes Mode of Transportation: Musician Occupation: Retired Prior Function Level of Independence: Independent with basic ADLs, Independent with homemaking with ambulation, Independent with gait, Independent with transfers  Able to Take Stairs?: Yes Driving: Yes Comments: very active PTA; no AD, driving, and taking care of Dog, Susie Vision/Perception  Vision- History Baseline Vision/History: Wears glasses Wears Glasses: Reading only Patient Visual Report: No change from baseline  Cognition Overall Cognitive Status: Within Functional Limits for tasks assessed Orientation Level: Person;Place;Situation Person: Oriented Place: Oriented Situation: Oriented Year: 2017 Month: January Day of Week: Incorrect Memory: Appears intact Immediate Memory Recall: Sock;Blue;Bed Memory Recall: Sock;Blue Memory Recall Sock: Without Cue Memory Recall Blue: Without Cue Safety/Judgment: Appears intact Sensation Sensation Light Touch: Appears Intact Coordination Gross Motor Movements are Fluid and Coordinated: Yes (Limited by pain and fatigue) Fine Motor Movements are Fluid and Coordinated: Not tested Motor  Motor Motor: Other  (comment) Motor - Skilled Clinical Observations: generalized weakness and pain inhibiting Trunk/Postural Assessment  Cervical Assessment Cervical Assessment: Within Functional Limits Thoracic Assessment Thoracic Assessment: Exceptions to [facility] (Flexed posture) Lumbar Assessment Lumbar Assessment: Exceptions to WFL (Decreased WB on R hip) Postural Control Postural Control: Within Functional Limits  Balance  Balance Balance Assessed: Yes Static Sitting Balance Static Sitting - Level of Assistance: 5: Stand by assistance Dynamic Sitting Balance Dynamic Sitting - Level of Assistance: 4: Min assist Sitting balance - Comments: limiting dynamically due to pain Static Standing Balance Static Standing - Level of Assistance: 4: Min assist Dynamic Standing Balance Dynamic Standing - Level of Assistance: 4: Min assist;3: Mod assist Extremity/Trunk Assessment RUE Assessment RUE Assessment: Not tested (Due to pain) LUE Assessment LUE Assessment: Not tested (Due to pain)   See Function Navigator for Current Functional Status.   Refer to Care Plan for Long Term Goals  Recommendations for other services: None  Discharge Criteria: Patient will be discharged from OT if patient refuses treatment 3 consecutive times without medical reason, if treatment goals not met, if there is a change in medical status, if patient makes no progress towards goals or if patient is discharged from hospital.  The above assessment, treatment plan, treatment alternatives and goals were discussed and mutually agreed upon: by patient  Katrina Burnett 05/27/2015, 12:27 PM

## 2015-05-27 NOTE — Progress Notes (Signed)
VASCULAR LAB PRELIMINARY  PRELIMINARY  PRELIMINARY  PRELIMINARY   Bilateral lower extremity venous Doppler has been completed. Bilateral:  No evidence of DVT, superficial thrombosis, or Baker's Cyst.   Janifer Adie, RVT, RDMS 05/27/2015, 11:53 AM

## 2015-05-28 ENCOUNTER — Inpatient Hospital Stay (HOSPITAL_COMMUNITY): Payer: Medicare Other | Admitting: Physical Therapy

## 2015-05-28 ENCOUNTER — Inpatient Hospital Stay (HOSPITAL_COMMUNITY): Payer: PRIVATE HEALTH INSURANCE | Admitting: Occupational Therapy

## 2015-05-28 ENCOUNTER — Inpatient Hospital Stay (HOSPITAL_COMMUNITY): Payer: Medicare Other

## 2015-05-28 NOTE — Plan of Care (Signed)
Problem: RH PAIN MANAGEMENT Goal: RH STG PAIN MANAGED AT OR BELOW PT'S PAIN GOAL <3  Outcome: Not Progressing Rates pain 4/10

## 2015-05-28 NOTE — Progress Notes (Signed)
Occupational Therapy Session Note  Patient Details  Name: Katrina Burnett MRN: [id number] Date of Birth: 10/22/1924  Today's Date: 05/28/2015 OT Individual Time: 1100-1200 OT Individual Time Calculation (min): 60 min    Short Term Goals: Week 1:  OT Short Term Goal 1 (Week 1): Pt will complete stand pivot transfer to toilet with supervision OT Short Term Goal 2 (Week 1): Pt will complete 3/3 toileting tasks with steadying assist OT Short Term Goal 3 (Week 1): Pt will tolerate full bathing/dressing session with no more than 3 rest breaks OT Short Term Goal 4 (Week 1): Pt will complete bathing task with min A  Skilled Therapeutic Interventions/Progress Updates:    Pt seen for OT ADL bathing and dressing session. Pt in supine upon arrival, agreeable to tx session. Close supervision required for stand pivot at [facility] for transfer to w/c. She completed oral care with set-up seated in w/c at sink. Stand pivot transfer with RW completed to shower. Pt bathed seated on shower chair, requiring increased assist for LB bathing due to pain and limited mobility. Pt with significantly increased pain with mobility, VCs provided for deep breathing techniques. Encouraged pt to stay sitting up in w/c at end of session for lunch, however, due to pain pt stated she couldn't and needed to return to supine. She ambulated ~72ft to bed with min A and min A required for transfer to supine. Pt left in supine at end of session, all needs in reach. Assisted pt with placing lunch order.   Educated pt throughout session regarding OT goals, deep breathing techniques, energy conservation, and d/c planning.   Therapy Documentation Precautions:  Precautions Precautions: Fall Restrictions Weight Bearing Restrictions: Yes RLE Weight Bearing: Partial weight bearing RLE Partial Weight Bearing Percentage or Pounds: 50% Pain: Pain Assessment Pain Assessment: 0-10 Pain Score: 6  Pain Type: Surgical pain Pain Location: Hip Pain  Orientation: Right Pain Intervention(s): Medication (See eMAR), RN aware, shower, rest, repositioned  See Function Navigator for Current Functional Status.   Therapy/Group: Individual Therapy  Lewis, Zakirah Weingart C 05/28/2015, 11:16 AM

## 2015-05-28 NOTE — Progress Notes (Signed)
[facility] PHYSICAL MEDICINE & REHABILITATION     PROGRESS NOTE    Subjective/Complaints: In much better spirits. Had a good night. Slept better. Having a little right hip pain. Just got out of bed.  ROS: Pt denies fever, rash/itching, headache, blurred or double vision, nausea, vomiting, abdominal pain, diarrhea, chest pain, shortness of breath, palpitations, dysuria, dizziness, +anxiety, pain   Objective: Vital Signs: Blood pressure 120/71, pulse 88, temperature 98.3 F (36.8 C), temperature source Oral, resp. rate 18, height 4\' 9"  (1.448 m), weight 46.5 kg (102 lb 8.2 oz), SpO2 98 %. No results found.  Recent Labs  05/26/15 0458 05/27/15 0615  WBC 7.8 6.6  HGB 9.6* 10.3*  HCT 30.5* 31.6*  PLT 255 274    Recent Labs  05/27/15 0615  NA 137  K 3.5  CL 102  GLUCOSE 97  BUN 12  CREATININE 0.82  CALCIUM 8.9   CBG (last 3)  No results for input(s): GLUCAP in the last 72 hours.  Wt Readings from Last 3 Encounters:  05/26/15 46.5 kg (102 lb 8.2 oz)  08/14/13 43.999 kg (97 lb)  10/05/12 42.638 kg (94 lb)    Physical Exam:  Constitutional: She is oriented to person, place, and time. She appears well-developed and well-nourished.  HENT:  Head: Normocephalic and atraumatic.  Eyes: Conjunctivae and EOM are normal.  Neck: Normal range of motion. Neck supple. No thyromegaly present.  Cardiovascular: Normal rate and regular rhythm.  Respiratory: Effort normal and breath sounds normal. No respiratory distress.  GI: Soft. Bowel sounds are normal. She exhibits no distension.  Musculoskeletal: She exhibits edema and tenderness.  Neurological: She is alert and oriented to person, place, and time. She has normal reflexes.  Sensation intact to light touch Motor: B/L UE, LLE 5/5 proximal distal Right hip flexion remains limited by pain, knee extension 4/5, ankle dorsi/plantar flexion 5/5  Skin: Skin is warm and dry.  Hip incision clean and dry with foam  dressing Psychiatric: happy and smiling today   Assessment/Plan: 1. Gait instability and functional deficits secondary to right IT hip fx which require 3+ hours per day of interdisciplinary therapy in a comprehensive inpatient rehab setting. Physiatrist is providing close team supervision and 24 hour management of active medical problems listed below. Physiatrist and rehab team continue to assess barriers to discharge/monitor patient progress toward functional and medical goals.  Function:  Bathing Bathing position      Bathing parts      Bathing assist        Upper Body Dressing/Undressing Upper body dressing                    Upper body assist        Lower Body Dressing/Undressing Lower body dressing                                  Lower body assist        Toileting Toileting   Toileting steps completed by patient: Performs perineal hygiene Toileting steps completed by helper: Adjust clothing prior to toileting, Performs perineal hygiene, Adjust clothing after toileting Toileting Assistive Devices: Grab bar or rail  Toileting assist Assist level: Touching or steadying assistance (Pt.75%)   Transfers Chair/bed transfer   Chair/bed transfer method: Stand pivot, Ambulatory Chair/bed transfer assist level: Touching or steadying assistance (Pt > 75%) Chair/bed transfer assistive device: Walker, Air cabin crew  Max distance: 18 Assist level: Touching or steadying assistance (Pt > 75%)   Wheelchair   Type: Manual Max wheelchair distance: 130 ft Assist Level: Supervision or verbal cues  Cognition Comprehension Comprehension assist level: Follows basic conversation/direction with no assist  Expression Expression assist level: Expresses basic needs/ideas: With no assist  Social Interaction Social Interaction assist level: Interacts appropriately 90% of the time - Needs monitoring or encouragement for participation or  interaction.  Problem Solving Problem solving assist level: Solves basic problems with no assist  Memory Memory assist level: Recognizes or recalls 90% of the time/requires cueing < 10% of the time   Medical Problem List and Plan: 1. Gait instability secondary to right intertrochanteric hip fracture. Status post ORIF. 50% weightbearing.  -continue CIR therapies---would expect more out of therapies today given improved mood 2. DVT Prophylaxis/Anticoagulation: Lovenox. Monitor platelet counts of any signs of bleeding.   -dopplers negative 3. Pain Management/polymyalgia: Hydrocodone and Robaxin as needed. May need to make adjustments with meds 4. Mood: Celexa 20 mg daily, Ativan 1 mg twice a day per home regimen  -continue daily emotional support  -requested neuropsych consult however she's doing much better today 5. Neuropsych: This patient is capable of making decisions on her own behalf. 6. Skin/Wound Care: Routine skin checks 7. Fluids/Electrolytes/Nutrition: Routine I's and O's with follow-up chemistries personally reviewed by me today.   -encourage PO 8. Acute blood loss anemia. Follow up hgb stable and back to 10.3. No signs of blood loss clinically  LOS (Days) 2 A FACE TO FACE EVALUATION WAS PERFORMED  SWARTZ,ZACHARY T 05/28/2015 9:25 AM

## 2015-05-28 NOTE — Progress Notes (Signed)
Occupational Therapy Note  Patient Details  Name: Katrina Burnett MRN: [id number] Date of Birth: February 02, 1925  Today's Date: 05/28/2015 OT Individual Time: 1330-1430 OT Individual Time Calculation (min): 60 min   Pt c/o 6/10 pain in right hip; repositioned, RN aware Individual Therapy  Pt resting in bed upon arrival and agreeable to therapy.  Pt stated that she was "exhausted" from morning therapies.  Pt propelled to ADL apartment and practiced walk-in shower transfers X 2. Pt required steady A and more than a reasonable amount of time.  Pt also engaged in functional amb with RW in ADL apartment.  Pt returned to room and requested to use toilet.  Pt amb with RW into bathroom for toileting tasks with steady A.  Pt returned to bed at end of session.  Focus on functional transfers, functional amb with RW, activity tolerance, sit<>stand, standing balance, and safety awareness to increase independence with BADLs.   Leotis Shames [facility] 05/28/2015, 2:52 PM

## 2015-05-28 NOTE — Care Management Note (Signed)
[facility] Individual Statement of Services  Patient Name:  LAMEIKA QASEM  Date:  05/28/2015  Welcome to the [facility].  Our goal is to provide you with an individualized program based on your diagnosis and situation, designed to meet your specific needs.  With this comprehensive rehabilitation program, you will be expected to participate in at least 3 hours of rehabilitation therapies Monday-Friday, with modified therapy programming on the weekends.  Your rehabilitation program will include the following services:  Physical Therapy (PT), Occupational Therapy (OT), 24 hour per day rehabilitation nursing, Therapeutic Recreaction (TR), Neuropsychology, Case Management (Social Worker), Rehabilitation Medicine, Nutrition Services and Pharmacy Services  Weekly team conferences will be held on Tuesdays to discuss your progress.  Your Social Worker will talk with you frequently to get your input and to update you on team discussions.  Team conferences with you and your family in attendance may also be held.  Expected length of stay: 2 weeks  Overall anticipated outcome: modified independent  Depending on your progress and recovery, your program may change. Your Social Worker will coordinate services and will keep you informed of any changes. Your Social Worker's name and contact numbers are listed  below.  The following services may also be recommended but are not provided by the [facility] will be made to provide these services after discharge if needed.  Arrangements include referral to agencies that provide these services.  Your insurance has been verified to be:  Medicare and [place] primary doctor is:  Dr. Jani Gravel  Pertinent information will be shared with your doctor and your insurance  company.  Social Worker:  [place], [place] or ([phone]   Information discussed with and copy given to patient by: Lennart Pall, 05/28/2015, 9:48 AM

## 2015-05-28 NOTE — Progress Notes (Signed)
Social Work  Social Work Assessment and Plan  Patient Details  Name: Katrina Burnett MRN: [id number] Date of Birth: March 28, 1925  Today's Date: 05/28/2015  Problem List:  Patient Active Problem List   Diagnosis Date Noted  . Fracture, intertrochanteric, right femur ([place]) 05/26/2015  . Closed fracture of femur ([place])   . Post-operative pain   . Acute blood loss anemia   . Adjustment disorder with mixed anxiety and depressed mood   . Falls   . Abnormality of gait   . Femur fracture, right, closed, initial encounter 05/23/2015  . Fracture of femoral neck, right, closed 05/22/2015  . Femoral neck fracture, right, closed, initial encounter 05/22/2015  . Syncope 05/30/2012  . Nausea & vomiting 05/30/2012  . Chest pain 05/30/2012  . Hypotension 05/30/2012  . Esophageal reflux 12/16/2011  . Lumbar compression fracture ([place])   . Polymyalgia [facility])    Past Medical History:  Past Medical History  Diagnosis Date  . Polymyalgia ([place])   . Lumbar compression fracture ([facility])     L1 and L2  . Allergic rhinitis   . Varicose veins   . GERD (gastroesophageal reflux disease)   . Barrett's esophagus 06/1997  . Osteoporosis   . Chondromalacia   . Pneumonia   . Vaginitis   . Diverticulosis   . Tubular adenoma of colon 06/1999  . Glaucoma    Past Surgical History:  Past Surgical History  Procedure Laterality Date  . Varicose vein surgery    . Fixation kyphoplasty lumbar spine  10/12/10; 09/20/11  . Lung biopsy    . Orif hip fracture Right 05/23/2015    Procedure: OPEN REDUCTION INTERNAL FIXATION HIP;  Surgeon: Frederik Pear, MD;  Location: [facility];  Service: Orthopedics;  Laterality: Right;   Social History:  reports that she has never smoked. She has never used smokeless tobacco. She reports that she does not drink alcohol or use illicit drugs.  Family / Support Systems Marital Status: Widow/Widower How Long?: since 1982 Patient Roles: Parent Children: daughter, Willeen Cass [facility]) @ (H)  [phone] or [phone];  son, Meda [facility]) @ (C) [phone];  son, [name], living in the home with pt and son, Edd Arbour living [place]. Anticipated Caregiver: [name], dtr Ability/Limitations of Caregiver: no limitations. Dtr lives in [place], but will move in Caregiver Availability: 24/7 Family Dynamics: Per daughter, pt's son, [name] moved in with pt approx one year ago due to his own social issues, but does not feel he is capable of providing reliable assist.  Pt describes daughter, [name], as "easy to get upset" but does not elaborate.    Social History Preferred language: English Religion: Baptist Cultural Background: NA Read: Yes Write: Yes Employment Status: Retired Freight forwarder Issues: None Guardian/Conservator: son, Josph Macho, has POA for pt.  Per MD, pt is capable of making decisions on her own behalf   Abuse/Neglect Physical Abuse: Denies Verbal Abuse: Denies Sexual Abuse: Denies Exploitation of patient/patient's resources: Denies Self-Neglect: Denies  Emotional Status Pt's affect, behavior adn adjustment status: Pt lying in bed and with multiple complaints about pain during assessment interview.  Flat affect and little eye contact until I am leaving - then smiles and thanks me for "coming by..."  She admits frustration with her injury and the fall itself (which she points out was caused by neighbors dog).  She denies any significant emotional distress or s/s of depression,however, per MD, will refer to neuropsychology for formal assessment. Recent Psychosocial Issues: Adult son moved in with  her this past yeardue to his own social issues. Pyschiatric History: None Substance Abuse History: None  Patient / Family Perceptions, Expectations & Goals Pt/Family understanding of illness & functional limitations: Pt with basic understanding of her hip fx, surgery performed and WB restrictions/ need for CIR.  Daughter also with good understanding. Premorbid  pt/family roles/activities: Pt was completely independent, still driving PTA. Anticipated changes in roles/activities/participation: Per team, goals are being set for mod independent, so little change anticipated.  Will need assist mostly with home management and transportation. Pt/family expectations/goals: "I just hope this pain keeps getting better." (does report decreased from yesterday)  [employer]: None Premorbid Home Care/DME Agencies: None Transportation available at discharge: yes Resource referrals recommended: Neuropsychology  Discharge Planning Living Arrangements: Children Support Systems: Children Type of Residence: Private residence Insurance Resources: [employer], Multimedia programmer (specify) Financial Resources: [place] Referred: No Living Expenses: Own Money Management: Patient Does the patient have any problems obtaining your medications?: No Home Management: pt was independent;  children assist as needed Patient/Family Preliminary Plans: Pt fully expects to d/c home with son and daughter providing any needed assistance. Social Work Anticipated Follow Up Needs: HH/OP Expected length of stay: ELOS 7-10 days  Clinical Impression Frail, elderly appearing woman here following an unfortunate fall which resulted in hip fx.  PTA, however, she was completely independent at home and in community.  Son living with her but not expected to be primary support.  Daughter very involved and plans to provide any assist needed.  Doing well despite chronic pain complaints.  Anticipate mod i goals and short LOS.  Will follow for support and d/c planning needs.  Kerrie Timm 05/28/2015, 9:44 AM

## 2015-05-28 NOTE — Progress Notes (Signed)
Social Work Patient ID: Katrina Burnett, female   DOB: 11-25-1924, 82 y.o.   MRN: [id number]   Have reviewed team conference info with pt, daughter (via phone) and son, [name].  All aware and agreeable with targeted d/c date of 1/31 with mod i goals.  Family reports that pt is "extremely independent and she doesn't like people hovering over her...".  They are able to provide up to 24/7 assist, however, not likely needed. Continue to follow.  Zaheer Wageman, LCSW

## 2015-05-28 NOTE — IPOC Note (Signed)
Overall Plan of Care [facility]) Patient Details Name: Katrina Burnett MRN: [id number] DOB: 10/22/1924  Admitting Diagnosis: hip fx  Hospital Problems: Active Problems:   Post-operative pain   Falls   Fracture, intertrochanteric, right femur ([facility])   Abnormality of gait     Functional Problem List: Nursing Pain, Skin Integrity  PT Balance, Edema, Endurance, Motor, Pain, Skin Integrity  OT Balance, Endurance, Safety, Pain  SLP    TR         Basic ADL's: OT Grooming, Bathing, Dressing, Toileting     Advanced  ADL's: OT Laundry     Transfers: PT Bed Mobility, Bed to Chair, Car, Manufacturing systems engineer, Metallurgist: PT Ambulation, Emergency planning/management officer, Stairs     Additional Impairments: OT None  SLP        TR      Anticipated Outcomes Item Anticipated Outcome  Self Feeding Independent  Swallowing      Basic self-care  Mod I  Toileting  Mod I   Bathroom Transfers Supervision  Bowel/Bladder  Mod I  Transfers  mod I basic transfers; Supervision car  Locomotion  mod I short distance gait in household, mod I w/c mobility for longer distances  Communication     Cognition     Pain  <5  Safety/Judgment  Supervision   Therapy Plan: PT Intensity: Minimum of 1-2 x/day ,45 to 90 minutes PT Frequency: 5 out of 7 days PT Duration Estimated Length of Stay: 14 days OT Intensity: Minimum of 1-2 x/day, 45 to 90 minutes OT Frequency: 5 out of 7 days OT Duration/Estimated Length of Stay: 14 days         Team Interventions: Nursing Interventions Patient/Family Education, Pain Management, Skin Care/Wound Management  PT interventions Ambulation/gait training, Training and development officer, Community reintegration, Discharge planning, Disease management/prevention, DME/adaptive equipment instruction, Functional mobility training, Neuromuscular re-education, Pain management, Patient/family education, Psychosocial support, Skin care/wound management, Stair training,  Therapeutic Activities, Therapeutic Exercise, UE/LE Strength taining/ROM, UE/LE Coordination activities, Wheelchair propulsion/positioning  OT Interventions Training and development officer, Discharge planning, Community reintegration, Engineer, drilling, Functional mobility training, Pain management, Patient/family education, Self Care/advanced ADL retraining, Therapeutic Activities, Therapeutic Exercise, UE/LE Strength taining/ROM, UE/LE Coordination activities  SLP Interventions    TR Interventions    SW/CM Interventions Discharge Planning, Psychosocial Support, Patient/Family Education    Team Discharge Planning: Destination: PT-Home ,OT- Home , SLP-  Projected Follow-up: PT-Home health PT, OT-  Home health OT, SLP-  Projected Equipment Needs: PT-Wheelchair (measurements), Wheelchair cushion (measurements), OT- To be determined, SLP-  Equipment Details: PT-pt already has RW, OT-  Patient/family involved in discharge planning: PT- Patient,  OT-Patient, SLP-   MD ELOS: 12-14 days Medical Rehab Prognosis:  Excellent Assessment: The patient has been admitted for CIR therapies with the diagnosis of right intertrochanteric hip fracture. The team will be addressing functional mobility, strength, stamina, balance, safety, adaptive techniques and equipment, self-care, bowel and bladder mgt, patient and caregiver education, pain mgt, ortho precautions, anxiety mgt, community reintegration. Goals have been set at mod I to occasional supervision for mobility and self-care/ADL's.    Meredith Staggers, MD, FAAPMR      See Team Conference Notes for weekly updates to the plan of care

## 2015-05-28 NOTE — Progress Notes (Signed)
Physical Therapy Session Note  Patient Details  Name: Katrina Burnett MRN: [id number] Date of Birth: 07/21/1924  Today's Date: 05/28/2015 PT Individual Time: 0800-0900 and [id number] PT Individual Time Calculation (min): 60 min and 25 min  Short Term Goals: Week 1:  PT Short Term Goal 1 (Week 1): Pt will perform bed mobility from flat surface with supervision PT Short Term Goal 2 (Week 1): Pt will be able to perform basic transfers with supervision PT Short Term Goal 3 (Week 1): Pt will be able to perform stair negotiation with mod assist to prepare for home entry PT Short Term Goal 4 (Week 1): Pt will be able to gait x 50' with supervision  Skilled Therapeutic Interventions/Progress Updates:   Treatment 1: Session focused on functional mobility training, BLE strengthening/ROM, and activity tolerance. Patient in wheelchair at sink, handoff from NT. Patient propelled wheelchair to gym using BUE with supervision and increased time for strengthening and endurance. Gait training using RW 2 x 10 ft with min guard. Patient transferred sit <> supine on mat table with wedge for comfort with supervision. Seated/supine BLE therex: LAQ to fatigue, seated alt hip flexion to fatigue, ankle pumps x 30, heel slides to fatigue each LE, supine hip abduction to tolerance RLE x 8 and LLE x 10, SLR (assisted RLE) x 10 each LE. Patient ambulated using RW x 18 ft with min guard-supervision back to wheelchair, transported in wheelchair back to room, and ambulated to recliner. Patient left in recliner with all needs within reach and BLE elevated.   Treatment 2: Patient resting in bed with increased R hip pain, RN notified. Patient initially refused to participate with therapy due to pain ("tell them I'm sick") but with increased time and max encouragement, she transferred to edge of bed using rail with supervision and ambulated to and from bathroom using RW with min guard for toileting. Patient stood at sink using RW with  close supervision for hand hygiene. Patient returned to bed and transferred sit > supine with supervision, +2A to reposition in bed. Patient left semi reclined with bed alarm on and all needs within reach.   Therapy Documentation Precautions:  Precautions Precautions: Fall Restrictions Weight Bearing Restrictions: Yes RLE Weight Bearing: Partial weight bearing RLE Partial Weight Bearing Percentage or Pounds: 50% Vital Signs: Therapy Vitals Pulse Rate: 88 BP: 120/71 mmHg Patient Position (if appropriate): Sitting Oxygen Therapy SpO2: 98 % O2 Device: Not Delivered Pain: Pain Assessment Pain Assessment: No/denies pain   See Function Navigator for Current Functional Status.   Therapy/Group: Individual Therapy  Laretta Alstrom 05/28/2015, 9:01 AM

## 2015-05-28 NOTE — Progress Notes (Signed)
Patient information reviewed and entered into eRehab system by Shereese Bonnie, RN, CRRN, PPS Coordinator.  Information including medical coding and functional independence measure will be reviewed and updated through discharge.     Per nursing patient was given "Data Collection Information Summary for Patients in Inpatient Rehabilitation Facilities with attached "Privacy Act Statement-Health Care Records" upon admission.  

## 2015-05-28 NOTE — Progress Notes (Signed)
Social Work Patient ID: Katrina Burnett, female   DOB: 1924/06/01, 82 y.o.   MRN: [id number]   Lowella Curb, LCSW Social Worker Signed  Patient Care Conference 05/27/2015  3:25 PM    Expand All Collapse All   Inpatient RehabilitationTeam Conference and Plan of Care Update Date: 05/27/2015   Time: 2:10 PM     Patient Name: Katrina Burnett       Medical Record Number: [id number]  Date of Birth: 03-28-1925 Sex: Female         Room/Bed: 4W19C/4W19C-01 Payor Info: Payor: MEDICARE / Plan: MEDICARE PART A AND B / Product Type: *No Product type* /    Admitting Diagnosis: hip fx   Admit Date/Time:  05/26/2015  6:21 PM Admission Comments: No comment available   Primary Diagnosis:  <principal problem not specified> Principal Problem: <principal problem not specified>    Patient Active Problem List     Diagnosis  Date Noted   .  Fracture, intertrochanteric, right femur ([place])  05/26/2015   .  Closed fracture of femur ([place])     .  Post-operative pain     .  Acute blood loss anemia     .  Adjustment disorder with mixed anxiety and depressed mood     .  Falls     .  Abnormality of gait     .  Femur fracture, right, closed, initial encounter  05/23/2015   .  Fracture of femoral neck, right, closed  05/22/2015   .  Femoral neck fracture, right, closed, initial encounter  05/22/2015   .  Syncope  05/30/2012   .  Nausea & vomiting  05/30/2012   .  Chest pain  05/30/2012   .  Hypotension  05/30/2012   .  Esophageal reflux  12/16/2011   .  Lumbar compression fracture ([place])     .  Polymyalgia [facility])       Expected Discharge Date: Expected Discharge Date: 06/10/15  Team Members Present: Physician leading conference: Dr. Alger Simons Social Worker Present: Lennart Pall, LCSW Nurse Present: Heather Roberts, RN PT Present: Canary Brim, PT OT Present: Napoleon Form, OT SLP Present: Weston Anna, SLP PPS Coordinator present : Daiva Nakayama, RN, CRRN        Current Status/Progress  Goal  Weekly Team Focus    Medical     right intertrochanteric hip fracture, polymyalgia, severe anxiety issues. pain  control anxiety for better therapy participation  anxiety control, wound care, sleep restoration   Bowel/Bladder     Incontinent of bladder x 1 during the noc. Continent of bowel. LBM 05/26/15  Pt to remain continent of bladder  Initiate time toileting q 2-3hrs   Swallow/Nutrition/ Hydration               ADL's     Min-mod functional transfers; min -total toileting depending on pain level  Mod I overall with supervision for shower transfers   Pain management, activity tolerance, ADL re-training   Mobility     min to mod assist overall; limited evaluation due to pain, anxiety, and dizziness  mod I to supervision overall for household distances for gait and transfers; min assist for stairs and car  pain management, transfers, gait training, endurance, strengthening, stair negotiation, balance   Communication               Safety/Cognition/ Behavioral Observations              Pain  Vicodin 1-2 tabs prn q 6hrs. Robaxin 500mg  q 6hrs prn  <4  Encourage therapy session to increase tolerance, and decrease need for pain   Skin     Skin tear to R elbow, R knee proximal and  lateral. Hip incision w/dermabond and Mepilex dressing  No additional skin breakdown  Assess skin q shift.  Monitor for approximate healing     Rehab Goals Patient on target to meet rehab goals: Yes *See Care Plan and progress notes for long and short-term goals.    Barriers to Discharge:  anxiety, pain issues     Possible Resolutions to Barriers:   neuropsych eval, ego support, pain control      Discharge Planning/Teaching Needs:   home with family to provide 24/7 assistance (daughter to stay)   to schedule prior to d/c.    Team Discussion:    New eval, hip fx, polymyalgia.  Tearful and anxious with MD this morning - very dramatic.  Limited participation with evals.  Therapists are setting mod i goals, however,  dependent on her participation overall.   Revisions to Treatment Plan:    None    Continued Need for Acute Rehabilitation Level of Care: The patient requires daily medical management by a physician with specialized training in physical medicine and rehabilitation for the following conditions: Daily direction of a multidisciplinary physical rehabilitation program to ensure safe treatment while eliciting the highest outcome that is of practical value to the patient.: Yes Daily medical management of patient stability for increased activity during participation in an intensive rehabilitation regime.: Yes Daily analysis of laboratory values and/or radiology reports with any subsequent need for medication adjustment of medical intervention for : Post surgical problems;Mood/behavior problems;Other  [name] 05/28/2015, 10:50 AM

## 2015-05-29 ENCOUNTER — Inpatient Hospital Stay (HOSPITAL_COMMUNITY): Payer: PRIVATE HEALTH INSURANCE | Admitting: Occupational Therapy

## 2015-05-29 ENCOUNTER — Inpatient Hospital Stay (HOSPITAL_COMMUNITY): Payer: Medicare Other

## 2015-05-29 ENCOUNTER — Encounter (HOSPITAL_COMMUNITY): Payer: PRIVATE HEALTH INSURANCE

## 2015-05-29 MED ORDER — ALPRAZOLAM 0.25 MG PO TABS
0.2500 mg | ORAL_TABLET | Freq: Three times a day (TID) | ORAL | Status: DC | PRN
  Administered 2015-05-30 – 2015-05-31 (×2): 0.25 mg via ORAL
  Filled 2015-05-29 (×2): qty 1

## 2015-05-29 NOTE — Progress Notes (Signed)
Physical Therapy Session Note  Patient Details  Name: Katrina Burnett MRN: [id number] Date of Birth: [birth year]  Today's Date: 05/29/2015 PT Individual Time: 1100-1200 PT Individual Time Calculation (min): 60 min   Short Term Goals: Week 1:  PT Short Term Goal 1 (Week 1): Pt will perform bed mobility from flat surface with supervision PT Short Term Goal 2 (Week 1): Pt will be able to perform basic transfers with supervision PT Short Term Goal 3 (Week 1): Pt will be able to perform stair negotiation with mod assist to prepare for home entry PT Short Term Goal 4 (Week 1): Pt will be able to gait x 50' with supervision  Skilled Therapeutic Interventions/Progress Updates:    Session focused on w/c mobility for functional mobility training and overall strengthening and endurance, simulated car transfer to prepare for discharge with overall steady assist, stair negotiation initiate to prepare for home entry (2 steps 3" height with B rails with max assist due to pain and difficulty maintaining PWB status, gait training with RW  X 25' with min guard and verbal cues for gait pattern and posture, seated BLE therex for functional strengthening to aid with mobility, and transfer back to bed at end of session due to pain level increased. Discussed with pt possibility of installing a ramp for home entry to increased independence due to significant difficulty with 3" step today and pt anxiety. Pt reports it may be an option and will think about it. Pt with difficulty maintaining PWB status with stairs and pt has 4- 5 steps at home that are standard height. Call bell in reach at end of session and pt left in supine.   Therapy Documentation Precautions:  Precautions Precautions: Fall Restrictions Weight Bearing Restrictions: Yes RLE Weight Bearing: Partial weight bearing RLE Partial Weight Bearing Percentage or Pounds: 50%    Pain: 7/10 hip pain - ice applied at end of session and pt premedicated.   See  Function Navigator for Current Functional Status.   Therapy/Group: Individual Therapy  Canary Brim Ivory Broad, PT, DPT  05/29/2015, 12:18 PM

## 2015-05-29 NOTE — Progress Notes (Signed)
Occupational Therapy Session Note  Patient Details  Name: Katrina Burnett MRN: [id number] Date of Birth: 08-03-1924  Today's Date: 05/29/2015 OT Individual Time: [id number] and 1015-1100 OT Individual Time Calculation (min): 15 min and 45 min   Short Term Goals: Week 1:  OT Short Term Goal 1 (Week 1): Pt will complete stand pivot transfer to toilet with supervision OT Short Term Goal 2 (Week 1): Pt will complete 3/3 toileting tasks with steadying assist OT Short Term Goal 3 (Week 1): Pt will tolerate full bathing/dressing session with no more than 3 rest breaks OT Short Term Goal 4 (Week 1): Pt will complete bathing task with min A  Skilled Therapeutic Interventions/Progress Updates:    Session One: Upon OT arrival, pt calling on call bell for return to bed. Pt very anxious and yelling for her to be returned to bed stating she had been up in w/c too long, and her R LE was hurting. Pt voiced pain in hip at 10/10. VCs for deep breathing provided to help calm pt down. Assisted pt back to bed with min A via stand pivot with RW. Min A required for management of R LE onto bed. Ice pack provided for comfort. Pt left in supine with all needs in reach, RN made aware.   Session Two: Therapist returned one hour later and pt reported feeling better and agreeable to tx session. She rated pain at 5/10 following hour rest break. She declined showering task this session, opting instead to bathe in w/c at sink. She bathed seated and stood with steadying assist to complete buttock/ periarea hygiene. Pt provided with and educated regarding use of LH sponge for LB bathing tasks. Also provided and demonstrated use of reacher for LB dressing, however, pt stated she did not want to use AE, and refused use of reacher. Pt able to thread LEs into underwear, however, voiced increased pain in R LE when doing so. Assist required for threading R LE into pants. Will cont to encourage pt to use AE for dressing  Tasks. She completed  toileting task via stand pivot at grab bar to toilet with steadying assist and VCs for technique. Pt left sitting in w/c at end of session in prep for hand off to PT.  Pt required VCs throughout session for hand placement on RW during functional transfers. She seems to have decreased insight into deficits, stating she believes she could be independent at home right now. Educated and verbalized areas in which pt requires assist and OT goals for pt increasing independence and safety prior to d/c home.   Therapy Documentation Precautions:  Precautions Precautions: Fall Restrictions Weight Bearing Restrictions: Yes RLE Weight Bearing: Partial weight bearing RLE Partial Weight Bearing Percentage or Pounds: 50% Pain: Pain Assessment Pain Score: 10-Worst pain ever Pain Location: Hip Pain Descriptors / Indicators: Aching Pain Intervention(s): RN made aware;Repositioned;Cold applied;Rest  See Function Navigator for Current Functional Status.   Therapy/Group: Individual Therapy  Lewis, Pharoah Goggins C 05/29/2015, 7:16 AM

## 2015-05-29 NOTE — Progress Notes (Signed)
Occupational Therapy Note  Patient Details  Name: Katrina Burnett MRN: [id number] Date of Birth: 1925/02/26  Today's Date: 05/29/2015 OT Individual Time: 1330-1430 OT Individual Time Calculation (min): 60 min   Pt c/o 8/10 pain in right hip; RN aware and admin meds at beginning of session Individual Therapy  Pt resting in bed upon arrival with daughter present.  Pt stated she was in pain and didn't want to be in the hospital.  Pt stated that pain meds didn't help and she couldn't sleep.  Pt stated she wanted to go home and pay for 24 hour care.  Emotional support provided and OT assured pt that her concerns would be passed on to CSW.  CSW notified.  Pt agreeable to engaging in functional amb with RW and dynamic standing tasks.  Pt again voiced desire to go home and take better pain meds and rest.  Reeducated patient on purpose of rehab and her current LTGs. Pt stated her pain increased with activity.  Pt stood to perform BUE tasks 4 X 5 mins and amb with RW to place towels in dirty linen bag.  Pt returned to bed with all needs within reach.  Focus on education, dynamic standing balance, functional amb with RW, and safety awareness.   Leotis Shames [facility] 05/29/2015, 2:41 PM

## 2015-05-29 NOTE — Progress Notes (Signed)
Pt complaining that pain medication not relieving her pain. Pt says that Norco is not doing anything for her. Pt is very anxious and tearful. RN offered Ice and repositioning. Pt refused. Ativan also offered at this time.

## 2015-05-29 NOTE — Progress Notes (Signed)
[place] PHYSICAL MEDICINE & REHABILITATION     PROGRESS NOTE    Subjective/Complaints:  Behavior labile from pleasant to severe anxiety. Anxious again this morning.   ROS: Pt denies fever, rash/itching, headache, blurred or double vision, nausea, vomiting, abdominal pain, diarrhea, chest pain, shortness of breath, palpitations, dysuria, dizziness, +anxiety, pain   Objective: Vital Signs: Blood pressure 124/63, pulse 69, temperature 98.2 F (36.8 C), temperature source Oral, resp. rate 18, height 4\' 9"  (1.448 m), weight 44.951 kg (99 lb 1.6 oz), SpO2 96 %. No results found.  Recent Labs  05/27/15 0615  WBC 6.6  HGB 10.3*  HCT 31.6*  PLT 274    Recent Labs  05/27/15 0615  NA 137  K 3.5  CL 102  GLUCOSE 97  BUN 12  CREATININE 0.82  CALCIUM 8.9   CBG (last 3)  No results for input(s): GLUCAP in the last 72 hours.  Wt Readings from Last 3 Encounters:  05/28/15 44.951 kg (99 lb 1.6 oz)  08/14/13 43.999 kg (97 lb)  10/05/12 42.638 kg (94 lb)    Physical Exam:  Constitutional: She is oriented to person, place, and time. She appears well-developed and well-nourished.  HENT:  Head: Normocephalic and atraumatic.  Eyes: Conjunctivae and EOM are normal.  Neck: Normal range of motion. Neck supple. No thyromegaly present.  Cardiovascular: Normal rate and regular rhythm.  Respiratory: Effort normal and breath sounds normal. No respiratory distress.  GI: Soft. Bowel sounds are normal. She exhibits no distension.  Musculoskeletal: She exhibits edema and tenderness.  Neurological: She is alert and oriented to person, place, and time. She has normal reflexes.  Sensation intact to light touch Motor: B/L UE, LLE 5/5 proximal distal Right hip flexion remains limited by pain, knee extension 4/5, ankle dorsi/plantar flexion 5/5  Skin: Skin is warm and dry.  Hip incision clean and dry with foam dressing Psychiatric: happy and smiling today   Assessment/Plan: 1.  Gait instability and functional deficits secondary to right IT hip fx which require 3+ hours per day of interdisciplinary therapy in a comprehensive inpatient rehab setting. Physiatrist is providing close team supervision and 24 hour management of active medical problems listed below. Physiatrist and rehab team continue to assess barriers to discharge/monitor patient progress toward functional and medical goals.  Function:  Bathing Bathing position   Position: Shower  Bathing parts Body parts bathed by patient: Right arm, Left arm, Chest, Abdomen, Front perineal area Body parts bathed by helper: Buttocks, Right upper leg, Left upper leg, Right lower leg, Left lower leg, Back  Bathing assist        Upper Body Dressing/Undressing Upper body dressing   What is the patient wearing?: Pull over shirt/dress     Pull over shirt/dress - Perfomed by patient: Thread/unthread right sleeve, Thread/unthread left sleeve, Put head through opening, Pull shirt over trunk          Upper body assist Assist Level: Set up   Set up : To obtain clothing/put away  Lower Body Dressing/Undressing Lower body dressing   What is the patient wearing?: Underwear, Pants, Ted Hose, Non-skid slipper socks   Underwear - Performed by helper: Thread/unthread right underwear leg, Thread/unthread left underwear leg, Pull underwear up/down Pants- Performed by patient: Pull pants up/down Pants- Performed by helper: Thread/unthread right pants leg, Thread/unthread left pants leg   Non-skid slipper socks- Performed by helper: Don/doff right sock, Don/doff left sock  TED Hose - Performed by helper: Don/doff right TED hose, Don/doff left TED hose  Lower body assist        Toileting Toileting   Toileting steps completed by patient: Performs perineal hygiene Toileting steps completed by helper: Adjust clothing prior to toileting, Performs perineal hygiene, Adjust clothing after toileting Toileting  Assistive Devices: Grab bar or rail  Toileting assist Assist level: Touching or steadying assistance (Pt.75%)   Transfers Chair/bed transfer   Chair/bed transfer method: Stand pivot, Ambulatory Chair/bed transfer assist level: Touching or steadying assistance (Pt > 75%) Chair/bed transfer assistive device: Walker, Air cabin crew     Max distance: 18 Assist level: Touching or steadying assistance (Pt > 75%)   Wheelchair   Type: Manual Max wheelchair distance: 130 ft Assist Level: Supervision or verbal cues  Cognition Comprehension Comprehension assist level: Follows basic conversation/direction with no assist  Expression Expression assist level: Expresses basic needs/ideas: With no assist  Social Interaction Social Interaction assist level: Interacts appropriately 90% of the time - Needs monitoring or encouragement for participation or interaction.  Problem Solving Problem solving assist level: Solves basic problems with no assist  Memory Memory assist level: Recognizes or recalls 90% of the time/requires cueing < 10% of the time   Medical Problem List and Plan: 1. Gait instability secondary to right intertrochanteric hip fracture. Status post ORIF. 50% weightbearing.  -continue CIR therapies---would expect more out of therapies today given improved mood 2. DVT Prophylaxis/Anticoagulation: Lovenox. Monitor platelet counts of any signs of bleeding.   -dopplers negative 3. Pain Management/polymyalgia: Hydrocodone and Robaxin as needed. May need to make adjustments with meds 4. Mood: Celexa 20 mg daily, Ativan 1 mg twice a day per home regimen--would like to avoid further benzos if possible  -continue daily emotional support  -neuropsych assessment today 5. Neuropsych: This patient is capable of making decisions on her own behalf. 6. Skin/Wound Care: Routine skin checks 7. Fluids/Electrolytes/Nutrition:  .    -encourage PO 8. Acute blood loss anemia. Follow  up hgb stable and back to 10.3.    LOS (Days) 3 A FACE TO FACE EVALUATION WAS PERFORMED  [name] T 05/29/2015 8:57 AM

## 2015-05-30 ENCOUNTER — Inpatient Hospital Stay (HOSPITAL_COMMUNITY): Payer: PRIVATE HEALTH INSURANCE | Admitting: Occupational Therapy

## 2015-05-30 ENCOUNTER — Ambulatory Visit: Payer: PRIVATE HEALTH INSURANCE | Admitting: Gastroenterology

## 2015-05-30 ENCOUNTER — Inpatient Hospital Stay (HOSPITAL_COMMUNITY): Payer: Medicare Other

## 2015-05-30 MED ORDER — METHOCARBAMOL 500 MG PO TABS
500.0000 mg | ORAL_TABLET | Freq: Three times a day (TID) | ORAL | Status: DC
  Administered 2015-05-30 – 2015-06-04 (×14): 500 mg via ORAL
  Filled 2015-05-30 (×15): qty 1

## 2015-05-30 MED ORDER — HYDROCODONE-ACETAMINOPHEN 5-325 MG PO TABS
1.0000 | ORAL_TABLET | Freq: Every day | ORAL | Status: DC
  Administered 2015-05-31 – 2015-06-04 (×5): 1 via ORAL
  Filled 2015-05-30 (×5): qty 1

## 2015-05-30 MED ORDER — METHOCARBAMOL 500 MG PO TABS
500.0000 mg | ORAL_TABLET | Freq: Three times a day (TID) | ORAL | Status: DC | PRN
  Administered 2015-05-31 – 2015-06-02 (×3): 500 mg via ORAL
  Filled 2015-05-30 (×2): qty 1

## 2015-05-30 NOTE — Progress Notes (Addendum)
[place] PHYSICAL MEDICINE & REHABILITATION     PROGRESS NOTE    Subjective/Complaints:  Mood cycles up and down. Perseverating on going home and that we are not giving her enough pain medication. "you are giving me the same i'm taking at home" (which was half a hydrocodone)  ROS: Pt denies fever, rash/itching, headache, blurred or double vision, nausea, vomiting, abdominal pain, diarrhea, chest pain, shortness of breath, palpitations, dysuria, dizziness, +anxiety, pain   Objective: Vital Signs: Blood pressure 138/68, pulse 70, temperature 98.4 F (36.9 C), temperature source Oral, resp. rate 17, height 4\' 9"  (1.448 m), weight 44.951 kg (99 lb 1.6 oz), SpO2 97 %. No results found. No results for input(s): WBC, HGB, HCT, PLT in the last 72 hours. No results for input(s): NA, K, CL, GLUCOSE, BUN, CREATININE, CALCIUM in the last 72 hours.  Invalid input(s): CO CBG (last 3)  No results for input(s): GLUCAP in the last 72 hours.  Wt Readings from Last 3 Encounters:  05/28/15 44.951 kg (99 lb 1.6 oz)  08/14/13 43.999 kg (97 lb)  10/05/12 42.638 kg (94 lb)    Physical Exam:  Constitutional: She is oriented to person, place, and time. She appears well-developed and well-nourished.  HENT:  Head: Normocephalic and atraumatic.  Eyes: Conjunctivae and EOM are normal.  Neck: Normal range of motion. Neck supple. No thyromegaly present.  Cardiovascular: Normal rate and regular rhythm.  Respiratory: Effort normal and breath sounds normal. No respiratory distress.  GI: Soft. Bowel sounds are normal. She exhibits no distension.  Musculoskeletal: She exhibits edema and tenderness.  Neurological: She is alert and oriented to person, place, and time. She has normal reflexes.  Sensation intact to light touch Motor: B/L UE, LLE 5/5 proximal distal Right hip flexion remains limited by pain, knee extension 4/5, ankle dorsi/plantar flexion 5/5  Skin: Skin is warm and dry.  Hip incision  clean and dry,  Well approximated Psychiatric: happy and smiling today   Assessment/Plan: 1. Gait instability and functional deficits secondary to right IT hip fx which require 3+ hours per day of interdisciplinary therapy in a comprehensive inpatient rehab setting. Physiatrist is providing close team supervision and 24 hour management of active medical problems listed below. Physiatrist and rehab team continue to assess barriers to discharge/monitor patient progress toward functional and medical goals.  Function:  Bathing Bathing position   Position: Wheelchair/chair at sink  Bathing parts Body parts bathed by patient: Right arm, Left arm, Chest, Abdomen, Front perineal area, Buttocks, Right upper leg, Left upper leg, Right lower leg, Left lower leg Body parts bathed by helper: Back  Bathing assist Assist Level: Supervision or verbal cues      Upper Body Dressing/Undressing Upper body dressing   What is the patient wearing?: Pull over shirt/dress     Pull over shirt/dress - Perfomed by patient: Thread/unthread right sleeve, Thread/unthread left sleeve, Put head through opening, Pull shirt over trunk          Upper body assist Assist Level: Set up   Set up : To obtain clothing/put away  Lower Body Dressing/Undressing Lower body dressing   What is the patient wearing?: Underwear, Pants Underwear - Performed by patient: Thread/unthread right underwear leg, Thread/unthread left underwear leg, Pull underwear up/down Underwear - Performed by helper: Thread/unthread right underwear leg, Thread/unthread left underwear leg, Pull underwear up/down Pants- Performed by patient: Thread/unthread left pants leg, Pull pants up/down, Thread/unthread right pants leg Pants- Performed by helper: Thread/unthread right pants leg Non-skid slipper  socks- Performed by patient: Don/doff left sock Non-skid slipper socks- Performed by helper: Don/doff right sock               TED Hose -  Performed by helper: Don/doff right TED hose, Don/doff left TED hose  Lower body assist Assist for lower body dressing: Supervision or verbal cues      Toileting Toileting   Toileting steps completed by patient: Adjust clothing prior to toileting, Performs perineal hygiene, Adjust clothing after toileting Toileting steps completed by helper: Adjust clothing prior to toileting, Performs perineal hygiene, Adjust clothing after toileting Toileting Assistive Devices: Grab bar or rail  Toileting assist Assist level: Touching or steadying assistance (Pt.75%)   Transfers Chair/bed transfer   Chair/bed transfer method: Stand pivot Chair/bed transfer assist level: Touching or steadying assistance (Pt > 75%) Chair/bed transfer assistive device: Walker, Air cabin crew     Max distance: 25' Assist level: Touching or steadying assistance (Pt > 75%)   Wheelchair   Type: Manual Max wheelchair distance: 150 Assist Level: Supervision or verbal cues  Cognition Comprehension Comprehension assist level: Follows basic conversation/direction with no assist  Expression Expression assist level: Expresses basic needs/ideas: With no assist  Social Interaction Social Interaction assist level: Interacts appropriately 90% of the time - Needs monitoring or encouragement for participation or interaction.  Problem Solving Problem solving assist level: Solves basic problems with no assist  Memory Memory assist level: Recognizes or recalls 90% of the time/requires cueing < 10% of the time   Medical Problem List and Plan: 1. Gait instability secondary to right intertrochanteric hip fracture. Status post ORIF. 50% weightbearing.  -continue CIR therapies---would expect more out of therapies today given improved mood 2. DVT Prophylaxis/Anticoagulation: Lovenox. Monitor platelet counts of any signs of bleeding.   -dopplers negative 3. Pain Management/polymyalgia: Hydrocodone and Robaxin as  needed.   -will schedule hydrocodone each am at 0600 and will schedule tid robaxin  -anxiety plays a large role also  -scheduled ice to hip 4. Mood: Celexa 20 mg daily, Ativan 1 mg twice a day per home regimen--would like to avoid further benzos if possible  -continue daily emotional support  -neuropsych assessment appreciated  -added prn xanax  5. Neuropsych: This patient is capable of making decisions on her own behalf. 6. Skin/Wound Care: Routine skin checks 7. Fluids/Electrolytes/Nutrition:     -encourage PO 8. Acute blood loss anemia. Follow up hgb stable and back to 10.3.    LOS (Days) 4 A FACE TO FACE EVALUATION WAS PERFORMED  Pegeen Stiger T 05/30/2015 9:46 AM

## 2015-05-30 NOTE — Progress Notes (Signed)
Orthopedic Tech Progress Note Patient Details:  Katrina Burnett 05/17/1924 [id number]  Ortho Devices Type of Ortho Device: Wrist splint Ortho Device/Splint Interventions: Application   [name] 05/30/2015, 3:34 PM

## 2015-05-30 NOTE — Consult Note (Signed)
PSYCHODIAGNOSTIC EVALUATION - [facility]   Ms. Katrina Burnett is an 82 year old woman, who was seen for an initial diagnostic evaluation in the setting of hip surgery.  According to staff members, she has been hyper-focused on pain to the extent that it has been adversely impacting her participation in various therapies.  Therefore, a neuropsychological consult was requested.    During the session, Ms. Katrina Burnett was initially loudly complaining about how terrible she felt and how much pain she was in and was visibly shaky.  She originally remarked that "they aren't giving me anything for pain," but later admitted that "well they give me something every few hours," but said that she did not feel what they were prescribing was helpful.  She also noted that she has not been sleeping well and the fatigue was troublesome.  Finally, she reported pain in her right wrist and requested a brace, given that she is often asked to push herself up using arm strength and bear weight on her wrist.  At the beginning of the session, Ms. Katrina Burnett was perseverative about wanting to go home and receive care there, stating repeatedly that cost was not a factor.  She then asked if I could "get [her] out."  When I told her that I did not have that power, her affect seemed to change, she quieted down, and was able to eat a little.  Her shakiness also seemed to resolve as discussion continued.  Although she still said that she was in pain, she was able to switch topics when directed and spoke in a calmer tone and volume.  Ms. Katrina Burnett was encouraged to see if there is an option for her to have similar rehabilitative therapy from within her home, but if that is not a possibility, to spend her energy figuring out how to make the best of her stay here.  Significant time was spent using strengths-based strategies and positive psychology to help her identify personal characteristics that have helped her  persevere during different situations in the past and helping her identify how she can rely on those characteristics to get her through this.  In addition, owing to her spirituality, time was spent using spiritual counseling techniques to remind Mr. Katrina Burnett that God would not put upon her something that she cannot handle; she also explored reasons why God may be having her endure her current situation.    Toward the end of the session, Ms. Katrina Burnett noted that she felt emotionally much better after speaking with the neuropsychologist.  She requested that the neuropsychologist return for another visit.     IMPRESSION:  Ms.  Katrina Burnett seems to be experiencing symptoms of both depression and anxiety.  While she is likely experiencing legitimate pain that is probably not fully resolved with current medications, it is also probable that her level of anxiety and distress surrounding her pain level is exacerbating physical pain.  Her physician may consider whether alterations to her current medication regimen would be medically indicated to further reduce pain and/or anxiety, and improve sleep.  It was unclear from today's session whether Ms. Katrina Burnett's depression and anxiety are longstanding, but she is certainly at least presenting with an adjustment disorder with mixed anxiety and depression.  Ms. Katrina Burnett is likely to be resistant to physical therapy and may present with more animated complaints to her physical therapists and physician.  It may be helpful for staff members to remind her about her inner strengths and that she can get through  this and to remind her about her faith in order to help her cope by using spirituality.  Staff members may also consider if a wrist brace is medically indicated.  A follow-up session with the neuropsychologist should be scheduled for next week to continue assisting with coping.    DIAGNOSIS:   Adjustment disorder with mixed anxiety and depressed mood  Marlane Hatcher, Psy.D.  Clinical  Neuropsychologist

## 2015-05-30 NOTE — Progress Notes (Signed)
Physical Therapy Session Note  Patient Details  Name: Katrina Burnett MRN: [id number] Date of Birth: [birth year]  Today's Date: 05/30/2015 PT Individual Time: 0915-1040 PT Individual Time Calculation (min): 85 min   Short Term Goals: Week 1:  PT Short Term Goal 1 (Week 1): Pt will perform bed mobility from flat surface with supervision PT Short Term Goal 2 (Week 1): Pt will be able to perform basic transfers with supervision PT Short Term Goal 3 (Week 1): Pt will be able to perform stair negotiation with mod assist to prepare for home entry PT Short Term Goal 4 (Week 1): Pt will be able to gait x 50' with supervision  Skilled Therapeutic Interventions/Progress Updates:    Session focused on functional bed mobility at supervision to mod I level, transfers with RW with cues for PWB status and hand placement (to push up from surface instead of pulling on RW) including toilet transfers, short distance gait with RW at steady assist to close supervision level in/out of bathroom and in room, w/c mobility training for UE strengthening and endurance and functional mobility, and introduced LE HEP (handout given). Pt instructed in ankle pumps, heel slides, hip abduction, quad sets, SAQ, LAQ, standing hip flexion/extension, and standing hamstring curls  x 15 reps each. Educated on importance of laying flat to stretch anterior hip at times as well. Pt verbalized understanding. Pt reporting not feeling well after standing therex thinking it was because she took pain medicine on an empty stomach. Provided some graham crackers and peanut butter and a drink which helped some but pain was increasing and pt request to return to room. BP taken (see below). Assisted back to bed and positioned with ice pack on RLE for pain relief. Missed last 5 min of session due to pain.   Therapy Documentation Precautions:  Precautions Precautions: Fall Restrictions Weight Bearing Restrictions: Yes RLE Weight Bearing: Partial weight  bearing RLE Partial Weight Bearing Percentage or Pounds: 50%   Vitals: Therapy Vitals Pulse Rate: 89 BP: (!) 121/55 mmHg Patient Position (if appropriate):  (after standing) Pain: 7/10 pain in R hip - premedicated  See Function Navigator for Current Functional Status.   Therapy/Group: Individual Therapy  Canary Brim Ivory Broad, PT, DPT  05/30/2015, 10:44 AM

## 2015-05-30 NOTE — Progress Notes (Signed)
Occupational Therapy Session Note  Patient Details  Name: Katrina Burnett MRN: [id number] Date of Birth: May 19, 1924  Today's Date: 05/30/2015 OT Individual Time: [id number] and 1345-1400 OT Individual Time Calculation (min): 60 min and 45 min   Short Term Goals: Week 1:  OT Short Term Goal 1 (Week 1): Pt will complete stand pivot transfer to toilet with supervision OT Short Term Goal 2 (Week 1): Pt will complete 3/3 toileting tasks with steadying assist OT Short Term Goal 3 (Week 1): Pt will tolerate full bathing/dressing session with no more than 3 rest breaks OT Short Term Goal 4 (Week 1): Pt will complete bathing task with min A  Skilled Therapeutic Interventions/Progress Updates:    Session One: Pt seen for OT session focusing on ADL re-training Pt sitting up in w/c upon arrival, eating breakfast. Had lengthy discussion with pt regarding OT goals and POC. Pt voicing she wants to go home despite not being at mod I level yet. Pt with questions regarding continuum of care and possibilities of privately paying for 24 hour care. Cont to re-iterate OT goals and pt's goals for returning to Mod I level prior to d/c home. Will follow up with CSW. Pt completed bathing/ dressing tasks seated in w/c at sink.Standing with close supervision to complete pericare/ buttock hygiene and to pull pants up. She verbaklized increased fatigue and hip pain at end of session, requesting return to supine. Stand pivot with min A completed to EOB. Pt left in supine with ice applied to R hip and all needs in reach.  Pt very anxious regarding pain and desire to return home which limits her participation in therapy sessions. Once pt able to be re-directed, she is close supervision- steadying assist overall.  Session Two: Pt seen for OT tx session focusing on functional mobility and pain management. Pt in supine upon arrival, voicing discomfort in R LE, however, with encouragement pt willing to participate in therapy. She  voiced need for toileting task. Therapist suggested pt walking to bathroom as she will have to do at home, however, pt refused ambulation due to R hip pain. Discussed with pt what she will do at home if she has pain but needs to get up to bathroom, pt stated "I'll figure that out when the time comes but right now I'm in pain and not doing it." Min A squat pivot transfer completed to w/c and w/c> toilet. Pt agreeable to attempt ambulation out of bathroom. Close supervision- min A required for ambulation, pt with 1 LOB episode requiring min-mod A to regain balance. She completed hand hygiene standing at sink then returned to w/c. She self propelled w/c to therapy day room. Once arrived, pt broke down sobbing. VCs provided for deep breathing techniques to calm pt down. Pt very hysterical, stating "you people are just here to hurt me more so I have to stay longer". She voiced she over did exercises in PT session earlier in day and needed a day or two to rest following extensive exercises. Reassurance of her strength and abilities provided, however, pt cont to yell at therapist and remained sobbing. RN made aware of complaints of pain and medication given. Pt refused any tasks in therapy and requested return to room. She self propelled w/c back to room and assist provided for return to bed. Pt left in supine with ice pack applied to R hip and all needs in reach. Pt's daughter arriving as therapist exited.   Therapy Documentation Precautions:  Precautions Precautions: Fall  Restrictions Weight Bearing Restrictions: Yes RLE Weight Bearing: Partial weight bearing RLE Partial Weight Bearing Percentage or Pounds: 50% Pain: Pain Assessment Pain Score: 10-Worst pain ever Pain Location: Hip Pain Orientation: Right Pain Descriptors / Indicators: Aching Pain Intervention(s): Medication (See eMAR);Repositioned;RN made aware;Rest;Cold applied  See Function Navigator for Current Functional Status.   Therapy/Group:  Individual Therapy  Lewis, Breeanne Oblinger C 05/30/2015, 7:12 AM

## 2015-05-31 ENCOUNTER — Inpatient Hospital Stay (HOSPITAL_COMMUNITY): Payer: Medicare Other | Admitting: Physical Therapy

## 2015-05-31 ENCOUNTER — Inpatient Hospital Stay (HOSPITAL_COMMUNITY): Payer: Medicare Other

## 2015-05-31 MED ORDER — TRAZODONE HCL 50 MG PO TABS
25.0000 mg | ORAL_TABLET | Freq: Every day | ORAL | Status: DC
  Administered 2015-05-31 – 2015-06-03 (×4): 25 mg via ORAL
  Filled 2015-05-31 (×4): qty 1

## 2015-05-31 NOTE — Progress Notes (Signed)
Physical Therapy Note  Patient Details  Name: Katrina Burnett MRN: [id number] Date of Birth: 1924/11/11 Today's Date: 05/31/2015    11:00-12:00 GROUP: 60 minutes  Pt transferred supine to edge of bed with head of bed elevated and S. Pt transferred edge of bed to w/c with rolling walker and min A. Pt propelled w/c to gym about 150 feet with B UEs and S. Pt transferred w/c to edge of mat with rolling walker and min A. Pt transferred edge of mat to supine with S and increased time. Pt preformed B LEs exercises 3 sets x 10 reps each, heel slides, hip abd/add, SAQs, and SLRs. Pt transferred supine to edge of mat with S and increased time. Pt transferred edge of mat to w/c with rolling walker and min A. Pt propelled w/c back to room with S about 150 feet.    Dub Amis 05/31/2015, 12:53 PM

## 2015-05-31 NOTE — Progress Notes (Signed)
[facility]IT[facility]     PROGRESS NOTE    Subjective/Complaints:  Want something for sleep. States that pain was worse yesterday with therapy although she likes the ice.  ROS: Pt denies fever, rash/itching, headache, blurred or double vision, nausea, vomiting, abdominal pain, diarrhea, chest pain, shortness of breath, palpitations, dysuria, dizziness, +anxiety, pain   Objective: Vital Signs: Blood pressure 124/56, pulse 73, temperature 98.1 F (36.7 C), temperature source Oral, resp. rate 17, height 4\' 9"  (1.448 m), weight 44.951 kg (99 lb 1.6 oz), SpO2 100 %. No results found. No results for input(s): WBC, HGB, HCT, PLT in the last 72 hours. No results for input(s): NA, K, CL, GLUCOSE, BUN, CREATININE, CALCIUM in the last 72 hours.  Invalid input(s): CO CBG (last 3)  No results for input(s): GLUCAP in the last 72 hours.  Wt Readings from Last 3 Encounters:  05/28/15 44.951 kg (99 lb 1.6 oz)  08/14/13 43.999 kg (97 lb)  10/05/12 42.638 kg (94 lb)    Physical Exam:  Constitutional: She is oriented to person, place, and time. She appears well-developed and well-nourished.  HENT:  Head: Normocephalic and atraumatic.  Eyes: Conjunctivae and EOM are normal.  Neck: Normal range of motion. Neck supple. No thyromegaly present.  Cardiovascular: Normal rate and regular rhythm.  Respiratory: Effort normal and breath sounds normal. No respiratory distress.  GI: Soft. Bowel sounds are normal. She exhibits no distension.  Musculoskeletal: She exhibits edema and tenderness.  Neurological: She is alert and oriented to person, place, and time. She has normal reflexes.  Sensation intact to light touch Motor: B/L UE, LLE 5/5 proximal distal Right hip flexion remains limited by pain, knee extension 4/5, ankle dorsi/plantar flexion 5/5  Skin: Skin is warm and dry.  Hip incision clean and dry--looks great Psychiatric: anxious but  cooperative   Assessment/Plan: 1. Gait instability and functional deficits secondary to right IT hip fx which require 3+ hours per day of interdisciplinary therapy in a comprehensive inpatient rehab setting. Physiatrist is providing close team supervision and 24 hour management of active medical problems listed below. Physiatrist and rehab team continue to assess barriers to discharge/monitor patient progress toward functional and medical goals.  Function:  Bathing Bathing position   Position: Wheelchair/chair at sink  Bathing parts Body parts bathed by patient: Right arm, Left arm, Chest, Abdomen, Front perineal area, Buttocks, Right upper leg, Left upper leg, Right lower leg, Left lower leg Body parts bathed by helper: Back  Bathing assist Assist Level: Supervision or verbal cues      Upper Body Dressing/Undressing Upper body dressing   What is the patient wearing?: Pull over shirt/dress     Pull over shirt/dress - Perfomed by patient: Thread/unthread right sleeve, Thread/unthread left sleeve, Put head through opening, Pull shirt over trunk          Upper body assist Assist Level: Set up   Set up : To obtain clothing/put away  Lower Body Dressing/Undressing Lower body dressing   What is the patient wearing?: Underwear, Pants Underwear - Performed by patient: Thread/unthread right underwear leg, Thread/unthread left underwear leg, Pull underwear up/down Underwear - Performed by helper: Thread/unthread right underwear leg, Thread/unthread left underwear leg, Pull underwear up/down Pants- Performed by patient: Thread/unthread left pants leg, Pull pants up/down, Thread/unthread right pants leg Pants- Performed by helper: Thread/unthread right pants leg Non-skid slipper socks- Performed by patient: Don/doff left sock Non-skid slipper socks- Performed by helper: Don/doff right sock  TED Hose - Performed by helper: Don/doff right TED hose, Don/doff left TED hose   Lower body assist Assist for lower body dressing: Supervision or verbal cues      Toileting Toileting   Toileting steps completed by patient: Adjust clothing prior to toileting, Performs perineal hygiene, Adjust clothing after toileting Toileting steps completed by helper: Adjust clothing prior to toileting, Performs perineal hygiene, Adjust clothing after toileting Toileting Assistive Devices: Grab bar or rail  Toileting assist Assist level: Touching or steadying assistance (Pt.75%)   Transfers Chair/bed transfer   Chair/bed transfer method: Ambulatory, Stand pivot Chair/bed transfer assist level: Touching or steadying assistance (Pt > 75%) Chair/bed transfer assistive device: Walker, Air cabin crew     Max distance: 20' Assist level: Touching or steadying assistance (Pt > 75%)   Wheelchair   Type: Manual Max wheelchair distance: 150 Assist Level: Supervision or verbal cues  Cognition Comprehension Comprehension assist level: Follows basic conversation/direction with no assist  Expression Expression assist level: Expresses basic needs/ideas: With no assist  Social Interaction Social Interaction assist level: Interacts appropriately 90% of the time - Needs monitoring or encouragement for participation or interaction.  Problem Solving Problem solving assist level: Solves basic 90% of the time/requires cueing < 10% of the time  Memory Memory assist level: Recognizes or recalls 75 - 89% of the time/requires cueing 10 - 24% of the time   Medical Problem List and Plan: 1. Gait instability secondary to right intertrochanteric hip fracture. Status post ORIF. 50% weightbearing.  -continue CIR therapies---would expect more out of therapies today given improved mood 2. DVT Prophylaxis/Anticoagulation: Lovenox. Monitor platelet counts of any signs of bleeding.   -dopplers negative 3. Pain Management/polymyalgia: Hydrocodone and Robaxin as needed.   -will schedule  hydrocodone each am at 0600 and will schedule tid robaxin  -anxiety plays a large role also  -scheduled ice to hip 4. Mood: Celexa 20 mg daily, Ativan 1 mg twice a day per home regimen--would like to avoid further benzos if possible  -continue daily emotional support  -neuropsych assessment appreciated  -  prn xanax   -will schedule low dose trazodone at hs, 25mg  5. Neuropsych: This patient is capable of making decisions on her own behalf. 6. Skin/Wound Care: Routine skin checks 7. Fluids/Electrolytes/Nutrition:     -encourage PO 8. Acute blood loss anemia. Follow up hgb stable and back to 10.3.    LOS (Days) 5 A FACE TO FACE EVALUATION WAS PERFORMED  SWARTZ,ZACHARY T 05/31/2015 10:08 AM

## 2015-05-31 NOTE — Progress Notes (Signed)
Occupational Therapy Note  Patient Details  Name: RONNIKA PINCH MRN: [id number] Date of Birth: [birth year]  Today's Date: 05/31/2015 OT Individual Time: 1330-1420 OT Individual Time Calculation (min): 50 min  and Today's Date: 05/31/2015 OT Missed Time: 25 Minutes Missed Time Reason: Other (comment) (n/v)  Pt denied pain Individual Therapy  Pt resting in bed upon arrival and agreeable to therapy.  Pt propelled to therapy gym with focus on w/c mobility. Pt initially engaged in BUE therex on Scifit to increase UB conditioning and strength to assist with functional amb and transfers.  Pt completed 4 mins on work load 1.5 with one rest break.  Pt transitioned to ADL apartment and initially engaged in functional amb with RW for simple home mgmt tasks.  Pt stated she felt weak and requested to sit down.  Pt stated that when her eyes were shut she felt like "she was running in my w/c." BP at 143/79 and HR at 94.  Pt returned to room.  Pt stated she felt nauseous.  Pt returned to bed and RN notified. Pt was very apologetic that she was unable to complete scheduled therapy.    Leotis Shames [facility] 05/31/2015, 2:26 PM

## 2015-05-31 NOTE — Progress Notes (Signed)
Occupational Therapy Session Note  Patient Details  Name: Katrina Burnett MRN: [id number] Date of Birth: [birth year]  Today's Date: 05/31/2015 OT Individual Time: 0900-1000 OT Individual Time Calculation (min): 60 min    Short Term Goals: Week 1:  OT Short Term Goal 1 (Week 1): Pt will complete stand pivot transfer to toilet with supervision OT Short Term Goal 2 (Week 1): Pt will complete 3/3 toileting tasks with steadying assist OT Short Term Goal 3 (Week 1): Pt will tolerate full bathing/dressing session with no more than 3 rest breaks OT Short Term Goal 4 (Week 1): Pt will complete bathing task with min A  Skilled Therapeutic Interventions/Progress Updates:    Pt resting in bed upon arrival. Pt stated she was awaiting pain medications but she was agreeable to engaging in BADL retraining including bathing and dressing with sit<>stand from w/c at sink.  Pt performed functional transfers bed<>w/c with supervision.  Pt required steady A only for standing balance at sink to bathe buttocks and pull up pants.  Pt requires more than a reasonable amount of time to complete tasks with min verbal cues to lock brakes on w/c when standing up.  Pt c/o lightheadedness when standing.  BP at 126/69 and HR 84.  At one point pt's BUE were shaking and she stated that happened when she needed pain medications.  Pt agreed to propelling w/c into hallway but c/o headache after 5 min and returned to room.  Pt transferred to toilet with steady A to use toilet before returning to bed.  Pt continues to perseverate on going home and hiring 24 hour care.  Continued discussion on purpose of rehab and her LTGs. Pt verbalized understanding.  Focus on activity tolerance, sit<>stand, standing balance, functional transfers, safety awareness, and w/c mobility.  Therapy Documentation Precautions:  Precautions Precautions: Fall Restrictions Weight Bearing Restrictions: Yes RLE Weight Bearing: Partial weight bearing RLE Partial  Weight Bearing Percentage or Pounds: 50%  Pain:  Pt c/o 7/10 pain in right hip; repositioned  See Function Navigator for Current Functional Status.   Therapy/Group: Individual Therapy  Leroy Libman 05/31/2015, 10:13 AM

## 2015-06-01 NOTE — Plan of Care (Signed)
Problem: RH BOWEL ELIMINATION Goal: RH STG MANAGE BOWEL WITH ASSISTANCE STG Manage Bowel with Assistance. Mod I  Outcome: Not Progressing Required laxative and sorbitol   Problem: RH PAIN MANAGEMENT Goal: RH STG PAIN MANAGED AT OR BELOW PT'S PAIN GOAL <3  Outcome: Not Progressing 4 or so

## 2015-06-01 NOTE — Progress Notes (Signed)
[name]     PROGRESS NOTE    Subjective/Complaints:  Slept great!! Best night since she's been in the hospital. Pain better too!!  ROS: Pt denies fever, rash/itching, headache, blurred or double vision, nausea, vomiting, abdominal pain, diarrhea, chest pain, shortness of breath, palpitations, dysuria, dizziness, +anxiety, pain   Objective: Vital Signs: Blood pressure 158/70, pulse 78, temperature 97.8 F (36.6 C), temperature source Oral, resp. rate 16, height 4\' 9"  (1.448 m), weight 44.951 kg (99 lb 1.6 oz), SpO2 97 %. No results found. No results for input(s): WBC, HGB, HCT, PLT in the last 72 hours. No results for input(s): NA, K, CL, GLUCOSE, BUN, CREATININE, CALCIUM in the last 72 hours.  Invalid input(s): CO CBG (last 3)  No results for input(s): GLUCAP in the last 72 hours.  Wt Readings from Last 3 Encounters:  05/28/15 44.951 kg (99 lb 1.6 oz)  08/14/13 43.999 kg (97 lb)  10/05/12 42.638 kg (94 lb)    Physical Exam:  Constitutional: She is oriented to person, place, and time. She appears well-developed and well-nourished.  HENT:  Head: Normocephalic and atraumatic.  Eyes: Conjunctivae and EOM are normal.  Neck: Normal range of motion. Neck supple. No thyromegaly present.  Cardiovascular: Normal rate and regular rhythm.  Respiratory: Effort normal and breath sounds normal. No respiratory distress.  GI: Soft. Bowel sounds are normal. She exhibits no distension.  Musculoskeletal: She exhibits edema and tenderness.  Neurological: She is alert and oriented to person, place, and time. She has normal reflexes.  Sensation intact to light touch Motor: B/L UE, LLE 5/5 proximal distal Right hip flexion remains limited by pain, knee extension 4/5, ankle dorsi/plantar flexion 5/5  Skin: Skin is warm and dry.  Hip incision clean and dry--looks great Psychiatric: anxious but cooperative   Assessment/Plan: 1. Gait instability  and functional deficits secondary to right IT hip fx which require 3+ hours per day of interdisciplinary therapy in a comprehensive inpatient rehab setting. Physiatrist is providing close team supervision and 24 hour management of active medical problems listed below. Physiatrist and rehab team continue to assess barriers to discharge/monitor patient progress toward functional and medical goals.  Function:  Bathing Bathing position   Position: Wheelchair/chair at sink  Bathing parts Body parts bathed by patient: Right arm, Left arm, Chest, Abdomen, Front perineal area, Buttocks, Right upper leg, Left upper leg, Right lower leg, Left lower leg Body parts bathed by helper: Back  Bathing assist Assist Level: Supervision or verbal cues      Upper Body Dressing/Undressing Upper body dressing   What is the patient wearing?: Pull over shirt/dress     Pull over shirt/dress - Perfomed by patient: Thread/unthread right sleeve, Thread/unthread left sleeve, Put head through opening, Pull shirt over trunk          Upper body assist Assist Level: Supervision or verbal cues   Set up : To obtain clothing/put away  Lower Body Dressing/Undressing Lower body dressing   What is the patient wearing?: Underwear, Pants Underwear - Performed by patient: Thread/unthread right underwear leg, Thread/unthread left underwear leg, Pull underwear up/down Underwear - Performed by helper: Thread/unthread right underwear leg, Thread/unthread left underwear leg, Pull underwear up/down Pants- Performed by patient: Thread/unthread left pants leg, Pull pants up/down, Thread/unthread right pants leg Pants- Performed by helper: Thread/unthread right pants leg Non-skid slipper socks- Performed by patient: Don/doff right sock, Don/doff left sock Non-skid slipper socks- Performed by helper: Don/doff right sock  TED Hose - Performed by helper: Don/doff right TED hose, Don/doff left TED hose  Lower body  assist Assist for lower body dressing: Touching or steadying assistance (Pt > 75%)      Toileting Toileting   Toileting steps completed by patient: Adjust clothing prior to toileting, Performs perineal hygiene, Adjust clothing after toileting Toileting steps completed by helper: Adjust clothing prior to toileting, Performs perineal hygiene, Adjust clothing after toileting Toileting Assistive Devices: Grab bar or rail  Toileting assist Assist level: Touching or steadying assistance (Pt.75%)   Transfers Chair/bed transfer   Chair/bed transfer method: Stand pivot, Ambulatory Chair/bed transfer assist level: Touching or steadying assistance (Pt > 75%) Chair/bed transfer assistive device: Armrests, Medical sales representative     Max distance: 20' Assist level: Touching or steadying assistance (Pt > 75%)   Wheelchair   Type: Manual Max wheelchair distance: 150 Assist Level: Supervision or verbal cues  Cognition Comprehension Comprehension assist level: Follows basic conversation/direction with no assist  Expression Expression assist level: Expresses basic needs/ideas: With extra time/assistive device  Social Interaction Social Interaction assist level: Interacts appropriately 90% of the time - Needs monitoring or encouragement for participation or interaction.  Problem Solving Problem solving assist level: Solves basic 90% of the time/requires cueing < 10% of the time  Memory Memory assist level: Recognizes or recalls 75 - 89% of the time/requires cueing 10 - 24% of the time   Medical Problem List and Plan: 1. Gait instability secondary to right intertrochanteric hip fracture. Status post ORIF. 50% weightbearing.  -continue CIR therapies-she is anxious to get home!! 2. DVT Prophylaxis/Anticoagulation: Lovenox. Monitor platelet counts of any signs of bleeding.   -dopplers negative 3. Pain Management/polymyalgia: Hydrocodone and Robaxin as needed.   -will schedule hydrocodone  each am at 0600 and will schedule tid robaxin  -anxiety impact  -scheduled ice to hip 4. Mood: Celexa 20 mg daily, Ativan 1 mg twice a day per home regimen--would like to avoid further benzos if possible  -continue daily emotional support  -neuropsych assessment appreciated  -  prn xanax   - scheduled low dose trazodone at hs, 25mg  helped quite a bit 5. Neuropsych: This patient is capable of making decisions on her own behalf. 6. Skin/Wound Care: Routine skin checks 7. Fluids/Electrolytes/Nutrition:     -encourage PO 8. Acute blood loss anemia. Follow up hgb stable and back to 10.3.    -recheck this week  LOS (Days) 6 A FACE TO FACE EVALUATION WAS PERFORMED  SWARTZ,ZACHARY T 06/01/2015 9:15 AM

## 2015-06-02 ENCOUNTER — Inpatient Hospital Stay (HOSPITAL_COMMUNITY): Payer: Medicare Other | Admitting: *Deleted

## 2015-06-02 ENCOUNTER — Inpatient Hospital Stay (HOSPITAL_COMMUNITY): Payer: Medicare Other

## 2015-06-02 ENCOUNTER — Inpatient Hospital Stay (HOSPITAL_COMMUNITY): Payer: PRIVATE HEALTH INSURANCE | Admitting: Occupational Therapy

## 2015-06-02 NOTE — Progress Notes (Signed)
[facility]     PROGRESS NOTE    Subjective/Complaints: Got sleeping medicine late. Didn't sleep as well. Anxious still to get home.  ROS: Pt denies fever, rash/itching, headache, blurred or double vision, nausea, vomiting, abdominal pain, diarrhea, chest pain, shortness of breath, palpitations, dysuria, dizziness, +anxiety, pain   Objective: Vital Signs: Blood pressure 143/63, pulse 74, temperature 98.5 F (36.9 C), temperature source Oral, resp. rate 16, height 4\' 9"  (1.448 m), weight 44.951 kg (99 lb 1.6 oz), SpO2 95 %. No results found. No results for input(s): WBC, HGB, HCT, PLT in the last 72 hours. No results for input(s): NA, K, CL, GLUCOSE, BUN, CREATININE, CALCIUM in the last 72 hours.  Invalid input(s): CO CBG (last 3)  No results for input(s): GLUCAP in the last 72 hours.  Wt Readings from Last 3 Encounters:  05/28/15 44.951 kg (99 lb 1.6 oz)  08/14/13 43.999 kg (97 lb)  10/05/12 42.638 kg (94 lb)    Physical Exam:  Constitutional: She is oriented to person, place, and time. She appears well-developed and well-nourished.  HENT:  Head: Normocephalic and atraumatic.  Eyes: Conjunctivae and EOM are normal.  Neck: Normal range of motion. Neck supple. No thyromegaly present.  Cardiovascular: Normal rate and regular rhythm.  Respiratory: Effort normal and breath sounds normal. No respiratory distress.  GI: Soft. Bowel sounds are normal. She exhibits no distension.  Musculoskeletal: She exhibits edema and tenderness.  Neurological: She is alert and oriented to person, place, and time. She has normal reflexes.  Sensation intact to light touch Motor: B/L UE, LLE 5/5 proximal distal Right hip flexion remains limited by pain, knee extension 4/5, ankle dorsi/plantar flexion 5/5  Skin: Skin is warm and dry.  Hip incision clean and dry  Psychiatric: normal mood. No anxiety   Assessment/Plan: 1. Gait instability and functional  deficits secondary to right IT hip fx which require 3+ hours per day of interdisciplinary therapy in a comprehensive inpatient rehab setting. Physiatrist is providing close team supervision and 24 hour management of active medical problems listed below. Physiatrist and rehab team continue to assess barriers to discharge/monitor patient progress toward functional and medical goals.  Function:  Bathing Bathing position   Position: Wheelchair/chair at sink  Bathing parts Body parts bathed by patient: Right arm, Left arm, Chest, Abdomen, Front perineal area, Buttocks, Right upper leg, Left upper leg, Right lower leg, Left lower leg Body parts bathed by helper: Back  Bathing assist Assist Level: Supervision or verbal cues      Upper Body Dressing/Undressing Upper body dressing   What is the patient wearing?: Pull over shirt/dress     Pull over shirt/dress - Perfomed by patient: Thread/unthread right sleeve, Thread/unthread left sleeve, Put head through opening, Pull shirt over trunk          Upper body assist Assist Level: More than reasonable time   Set up : To obtain clothing/put away  Lower Body Dressing/Undressing Lower body dressing   What is the patient wearing?: Underwear, Pants Underwear - Performed by patient: Thread/unthread right underwear leg, Thread/unthread left underwear leg, Pull underwear up/down Underwear - Performed by helper: Thread/unthread right underwear leg, Thread/unthread left underwear leg, Pull underwear up/down Pants- Performed by patient: Thread/unthread left pants leg, Pull pants up/down, Thread/unthread right pants leg Pants- Performed by helper: Thread/unthread right pants leg Non-skid slipper socks- Performed by patient: Don/doff right sock, Don/doff left sock Non-skid slipper socks- Performed by helper: Don/doff right sock, Don/doff left sock  TED Hose - Performed by helper: Don/doff right TED hose, Don/doff left TED hose  Lower  body assist Assist for lower body dressing: Supervision or verbal cues      Toileting Toileting   Toileting steps completed by patient: Adjust clothing prior to toileting, Performs perineal hygiene, Adjust clothing after toileting Toileting steps completed by helper: Performs perineal hygiene, Adjust clothing prior to toileting, Adjust clothing after toileting [place]: Grab bar or rail  Toileting assist Assist level: Supervision or verbal cues   Transfers Chair/bed transfer   Chair/bed transfer method: Stand pivot, Ambulatory Chair/bed transfer assist level: Touching or steadying assistance (Pt > 75%) Chair/bed transfer assistive device: Armrests, Medical sales representative     Max distance: 20' Assist level: Touching or steadying assistance (Pt > 75%)   Wheelchair   Type: Manual Max wheelchair distance: 150 Assist Level: Supervision or verbal cues  Cognition Comprehension Comprehension assist level: Follows complex conversation/direction with extra time/assistive device  Expression Expression assist level: Expresses basic needs/ideas: With extra time/assistive device  Social Interaction Social Interaction assist level: Interacts appropriately 90% of the time - Needs monitoring or encouragement for participation or interaction.  Problem Solving Problem solving assist level: Solves basic 90% of the time/requires cueing < 10% of the time  Memory Memory assist level: Recognizes or recalls 75 - 89% of the time/requires cueing 10 - 24% of the time   Medical Problem List and Plan: 1. Gait instability secondary to right intertrochanteric hip fracture. Status post ORIF. 50% weightbearing.  -continue CIR therapies-discussed plan of team, safety issues, etc. Did tell her we would discuss again at next team conference 2. DVT Prophylaxis/Anticoagulation: Lovenox. Monitor platelet counts of any signs of bleeding.   -dopplers negative 3. Pain  Management/polymyalgia: Hydrocodone and Robaxin as needed.   -will schedule hydrocodone each am at 0600 and scheduled tid robaxin  -anxiety impact  -scheduled ice to hip 4. Mood: Celexa 20 mg daily, Ativan 1 mg twice a day per home regimen--would like to avoid further benzos if possible  -continue daily emotional support  -neuropsych assessment appreciated  -  prn xanax   - scheduled low dose trazodone at hs, 25mg  ---needs to get it on time. 5. Neuropsych: This patient is capable of making decisions on her own behalf. 6. Skin/Wound Care: Routine skin checks 7. Fluids/Electrolytes/Nutrition:     -encourage PO 8. Acute blood loss anemia. Follow up hgb stable and back to 10.3.    -recheck wednesday  LOS (Days) 7 A FACE TO FACE EVALUATION WAS PERFORMED  SWARTZ,ZACHARY T 06/02/2015 9:15 AM

## 2015-06-02 NOTE — Progress Notes (Signed)
Physical Therapy Session Note  Patient Details  Name: Katrina Burnett MRN: [id number] Date of Birth: 1924/11/25  Today's Date: 06/02/2015 PT Individual Time: 1540-1610 PT Individual Time Calculation (min): 30 min   Short Term Goals: Week 1:  PT Short Term Goal 1 (Week 1): Pt will perform bed mobility from flat surface with supervision PT Short Term Goal 2 (Week 1): Pt will be able to perform basic transfers with supervision PT Short Term Goal 3 (Week 1): Pt will be able to perform stair negotiation with mod assist to prepare for home entry PT Short Term Goal 4 (Week 1): Pt will be able to gait x 50' with supervision  Skilled Therapeutic Interventions/Progress Updates:  TX focused on functional mobility, car transfer, and gait with RW. Pt resting in recliner, ready to go with no complaints.   Pt performed all transfers recliner<>WC<>car with S and gait in room and controlled settings with close S and postural cues, good hand placement. Adjusted RW for optimal height and safety.   Car transfer with S overall and sequence cues with RW.   Pt left up in recliner with all needs in reach. Pt encouraged to mail back survey as she is pleased with her care.       Therapy Documentation Precautions:  Precautions Precautions: Fall Restrictions Weight Bearing Restrictions: Yes RLE Weight Bearing: Partial weight bearing RLE Partial Weight Bearing Percentage or Pounds: 50% General:   Vital Signs: Therapy Vitals Pulse Rate: (!) 102 BP: 132/82 mmHg Patient Position (if appropriate): Lying Pain: none  Other Treatments:     See Function Navigator for Current Functional Status.   Therapy/Group: Individual Therapy   Kennieth Rad, PT, DPT   06/02/2015, 3:53 PM

## 2015-06-02 NOTE — Progress Notes (Signed)
Occupational Therapy Session Note  Patient Details  Name: Katrina Burnett MRN: [id number] Date of Birth: Jul 20, 1924  Today's Date: 06/02/2015 OT Individual Time: [id number] and 1330-1415 OT Individual Time Calculation (min): 60 min and 45 min   Short Term Goals: Week 1:  OT Short Term Goal 1 (Week 1): Pt will complete stand pivot transfer to toilet with supervision OT Short Term Goal 2 (Week 1): Pt will complete 3/3 toileting tasks with steadying assist OT Short Term Goal 3 (Week 1): Pt will tolerate full bathing/dressing session with no more than 3 rest breaks OT Short Term Goal 4 (Week 1): Pt will complete bathing task with min A  Skilled Therapeutic Interventions/Progress Updates:    Session One: Pt seen for OT ADL bathing and dressing session. Pt asleep in supine upon arrival, easily awoken and agreeable to tx session. She was apologetic for behaviors on Friday, stating she was just in pain and knows that we are here to help her get home safely.  She ambulated to bathroom using RW with close supervision- steadying A. VCs required for adherence to WB pre-cautions. She washed hands standing at sink. She declined showering task this AM opting instead to bathe seated in w/c at sink. She completed standing pericare/ buttock hygiene, stabilizing on sink with supervision. She dressed seated in w/c, standing with supervision to pull pants up. She voiced feeling dizzy and "weird" while sitting in w/c. BP 108/79. TED hose donned, pt reported feeling better following rest break and TED hose donned. Pt set up with breakfast tray, left with all needs in reach.  Discussed with pt regarding d/c disposition. Pt reports that she has 24/7 supervision at d/c, and desires to go home. Will discuss with CSW and medical team regarding modiffing goals and d/c planning.  Min cues required throughout session for locking of w/c brakes and hand placement during functional transers.   Session Two: Pt seen for OT tx session  focusing on functional mobility and transfers.  Pt sitting up in recliner upon arrival, finishing lunch. Pt exicted to hear d/c date has been moved up and also with questions regarding continuum of care. All questions answered regarding follow up therapies, OT goals, and safety at d/c.  She ambulated throughout room with supervision to bathroom to complete toileting task. Oral and hand hygiene completed standing at sink with supervision. She self propelled w/c to ADL apartment. Instructions given for w/c parts management to remove leg rests. She completed squat pivot transfer with supervision to EOB. Education and demonstration provided regarding use of 3-1 BSC. Pt completed EOB <> BSC transfer with supervision. Recommending pt use BSC at night in order to reduce risk of falls. Then demonstrated use of BSC over toilet. Pt very excited about this option as she does not have grab bars near toilet at home. She ambulated into bathroom and completed BSC over toilet transfer with supervision. CSW made aware of need of BSC for d/c. Pt returned to room at end of session requesting return to supine. With verbal and tactile cues pt able to manage w/c leg rests. She returned to supine, left with all needs in reach and bed alarm on.   Therapy Documentation Precautions:  Precautions Precautions: Fall Restrictions Weight Bearing Restrictions: Yes RLE Weight Bearing: Partial weight bearing RLE Partial Weight Bearing Percentage or Pounds: 50% Pain: Pain Assessment Pain Assessment: 0-10 Pain Score: 8   See Function Navigator for Current Functional Status.   Therapy/Group: Individual Therapy  Lewis, Zamyah Wiesman C 06/02/2015, 7:12  AM

## 2015-06-02 NOTE — Progress Notes (Signed)
Physical Therapy Session Note  Patient Details  Name: DEBROHA SALIK MRN: [id number] Date of Birth: [birth year]  Today's Date: 06/02/2015 PT Individual Time: 1000-1100 PT Individual Time Calculation (min): 60 min   Short Term Goals: Week 1:  PT Short Term Goal 1 (Week 1): Pt will perform bed mobility from flat surface with supervision PT Short Term Goal 2 (Week 1): Pt will be able to perform basic transfers with supervision PT Short Term Goal 3 (Week 1): Pt will be able to perform stair negotiation with mod assist to prepare for home entry PT Short Term Goal 4 (Week 1): Pt will be able to gait x 50' with supervision  Skilled Therapeutic Interventions/Progress Updates:    Session focused on d/c planning discussion, functional bed mobility and transfer training with RW, gait training for functional mobility and endurance training with RW, stair negotiation to prepare for home entry, and w/c mobility training. Pt at supervision level for basic transfers and close supervision to steady assist for gait on unit. Pt demonstrated improvement with stair negotiation at min assist level for 8 steps (3") and 4 steps (6") and discussed options for home. Pt reports she wants to see how it is at home before she installs a ramp and this PT agrees that this is fine as pt made good improvement in stair negotiation and will have available assist from son. Also discussed pt's request to move d/c date up with understanding that she likely will not reach modified independent level and pt in agreement. Some goals will be downgraded to supervision level to reflect this. Will discuss with primary OT and treatment team in regards to when pt will be ready.   Therapy Documentation Precautions:  Precautions Precautions: Fall Restrictions Weight Bearing Restrictions: Yes RLE Weight Bearing: Partial weight bearing RLE Partial Weight Bearing Percentage or Pounds: 50%  Pain:  c/o pain in R hip - RN notified for pain  medication.   See Function Navigator for Current Functional Status.   Therapy/Group: Individual Therapy  Canary Brim Ivory Broad, PT, DPT  06/02/2015, 12:06 PM

## 2015-06-03 ENCOUNTER — Inpatient Hospital Stay (HOSPITAL_COMMUNITY): Payer: PRIVATE HEALTH INSURANCE | Admitting: Occupational Therapy

## 2015-06-03 ENCOUNTER — Inpatient Hospital Stay (HOSPITAL_COMMUNITY): Payer: Medicare Other

## 2015-06-03 ENCOUNTER — Encounter (HOSPITAL_COMMUNITY): Payer: PRIVATE HEALTH INSURANCE | Admitting: Occupational Therapy

## 2015-06-03 MED ORDER — TRAMADOL HCL 50 MG PO TABS
50.0000 mg | ORAL_TABLET | Freq: Four times a day (QID) | ORAL | Status: DC | PRN
  Administered 2015-06-03: 50 mg via ORAL
  Filled 2015-06-03 (×2): qty 1

## 2015-06-03 NOTE — Plan of Care (Signed)
Problem: RH Balance Goal: LTG Patient will maintain dynamic standing with ADLs (OT) LTG: Patient will maintain dynamic standing balance with assist during activities of daily living (OT)  Outcome: Not Met (add Reason) Pt requires supervision for dynamic tasks- AL  Problem: RH Dressing Goal: LTG Patient will perform lower body dressing w/assist (OT) LTG: Patient will perform lower body dressing with assist, with/without cues in positioning using equipment (OT)  Outcome: Not Met (add Reason) Pt requires supervision- AL  Problem: RH Toileting Goal: LTG Patient will perform toileting w/assist, cues/equip (OT) LTG: Patient will perform toiletiing (clothes management/hygiene) with assist, with/without cues using equipment (OT)  Outcome: Not Met (add Reason) Pt requires supervision- AL  Problem: RH Toilet Transfers Goal: LTG Patient will perform toilet transfers w/assist (OT) LTG: Patient will perform toilet transfers with assist, with/without cues using equipment (OT)  Outcome: Not Met (add Reason) Pt requires supervision and min cuing for hand placement and safety awareness during functional tasks- AL

## 2015-06-03 NOTE — Discharge Summary (Signed)
Katrina Burnett, Katrina Burnett NO.:  [id number]  MEDICAL RECORD NO.:  [id number]  LOCATION:  4W19C                        FACILITY:  [facility]  PHYSICIAN:  Meredith Staggers, M.D.DATE OF BIRTH:  1925-02-10  DATE OF ADMISSION:  05/26/2015 DATE OF DISCHARGE:                              DISCHARGE SUMMARY   DISCHARGE DATE:  June 04, 2015.  DISCHARGE DIAGNOSES: 1. Gait instability secondary to right intertrochanteric hip fracture,     status post open reduction and internal fixation (ORIF). 2. Subcutaneous Lovenox for deep venous thrombosis prophylaxis with     negative venous Doppler studies. 3. Pain management with history of polymyalgia. 4. Depression. 5. Acute blood loss anemia. 6. Constipation, resolved.  HISTORY OF PRESENT ILLNESS:  This is an 82 year old right-handed female with history of polymyalgia.  Lives with her son.  One level home, 5 steps to entry.  Admitted on May 22, 2015, after a fall while walking her dog when she lost her balance landing on her right side.  No loss of consciousness.  Sustained a small laceration to the right arm. X-rays and imaging revealed a right hip intertrochanteric fracture. Underwent ORIF on May 23, 2015 per Dr. Mayer Camel.  50% weightbearing. Hospital course, pain management.  Subcutaneous Lovenox for DVT prophylaxis.  Acute blood loss anemia 9.6, and monitored.  Physical and occupational therapy ongoing.  The patient was admitted for comprehensive rehab program.  PAST MEDICAL HISTORY:  See discharge diagnoses.  SOCIAL HISTORY:  Lives with family.  Independent prior to admission. Functional status upon admission to rehab services.  Min assist stand pivot transfers, +2 safety equipment sit to stand, min to mod assist activities of daily living.  PHYSICAL EXAMINATION:  VITAL SIGNS:  Blood pressure 95/61, pulse 81, temperature 98, respirations 16. GENERAL:  This was an alert female, oriented x3. LUNGS:  Clear to  auscultation without wheeze. CARDIAC:  Regular rate and rhythm without murmur. ABDOMEN:  Soft, nontender.  Good bowel sounds. Hip incision clean and dry, appropriately tender.  [place] COURSE:  The patient was admitted to inpatient rehab services with therapies initiated on a 3-hour daily basis consisting of physical therapy, occupational therapy, and rehabilitation nursing.  The following issues were addressed during the patient's rehabilitation stay.  Pertaining to Ms. Clodfelter's right intertrochanteric hip fracture, she had undergone ORIF.  Neurovascular sensation intact.  She was 50% weightbearing.  She would follow up with Dr. Mayer Camel [facility].  Maintained on Lovenox for DVT prophylaxis.  No bleeding episodes.  Venous Doppler studies negative. Pain management with a history of polymyalgia maintained on hydrocodone and Robaxin as needed with good results with anxiety impacting on her pain management.  She continued on Celexa as well as Ativan for her history of anxiety and depression with emotional support provided, as well as scheduled low-dose trazodone at night time.  She had some mild constipation resolved with laxative assistance.  Acute blood loss anemia, stable.  Latest hemoglobin 10.3.  The patient received weekly collaborative interdisciplinary team conferences to discuss estimated length of stay, family teaching, and any barriers to discharge. Sessions focused on functional bed mobility, transfer training rolling walker, gait training for functional  mobility, and endurance training with rolling walker, stair negotiations.  The patient at supervision level for basic transfers and close supervision to steady assist for ambulation.  Demonstrated improvement with stair negotiation and minimal assistance.  Activities of daily living and homemaking.  She get ambulate to the bathroom using a rolling walker, close supervision steady assist.  Wash her  hands standing at the sink.  She decline some showering tasks.  She dressed seated in her wheelchair.  Overall, the patient with excellent overall progress with some encouragement needed and discharged to home with ongoing therapies dictated per rehab services.  DISCHARGE MEDICATIONS: 1. Xanax 0.25 mg p.o. t.i.d. as needed. 2. Astelin nasal spray 1 spray to each nostril daily. 3. Celexa 20 mg p.o. daily. 4. Trusopt ophthalmic solution 2% 1 drop to both eyes twice daily. 5. Pepcid 10 mg p.o. b.i.d. 6. Flonase 1 spray to each nostril daily. 7. Hydrocodone 1 to 2 tablets every 6 hours as needed for pain,     dispense of 90 tablets. 8. Xalatan ophthalmic solution 1 drop to both eyes at bedtime. 9. Claritin 10 mg p.o. daily. 10.Ativan 1 mg p.o. b.i.d. 11.Robaxin 500 mg every 8 hours as needed for muscle spasms, dispense     of 90 tablets. 12.Protonix 40 mg p.o. daily. 13.Desyrel 25 mg p.o. at bedtime.  DIET:  Mechanical soft.  The patient would follow up with Dr. Alger Simons at the outpatient rehab service office as needed; Dr. Frederik Pear in 2 weeks, call for appointment; Dr. Jani Gravel, medical management.  SPECIAL INSTRUCTIONS:  50% partial weightbearing in right lower extremity.     Lauraine Rinne, P.A.   ______________________________ Meredith Staggers, M.D.    DA/MEDQ  D:  06/03/2015  T:  06/03/2015  Job:  [id number]  cc:   Meredith Staggers, M.D. Kathalene Frames. Mayer Camel, M.D. Arlyss Repress, MD

## 2015-06-03 NOTE — Discharge Instructions (Signed)
Inpatient Rehab Discharge Instructions  Katrina Burnett Discharge date and time: No discharge date for patient encounter.   Activities/Precautions/ Functional Status: Activity: 50% weight bearing right lower extremity Diet: soft Wound Care: keep wound clean and dry Functional status:  ___ No restrictions     ___ Walk up steps independently ___ 24/7 supervision/assistance   ___ Walk up steps with assistance ___ Intermittent supervision/assistance  ___ Bathe/dress independently ___ Walk with walker     ___ Bathe/dress with assistance ___ Walk Independently    ___ Shower independently _x__ Walk with assistance    _x__ Shower with assistance ___ No alcohol     ___ Return to work/school ________   COMMUNITY REFERRALS UPON DISCHARGE:    Home Health:   PT     OT                        Agency:  [facility] Phone: [phone]   Medical Equipment/Items Ordered: wheelchair, commode and tub seat                                                     Agency/Supplier: [facility] @ [phone]      Special Instructions:    My questions have been answered and I understand these instructions. I will adhere to these goals and the provided educational materials after my discharge from the hospital.  Patient/Caregiver Signature _______________________________ Date __________  Clinician Signature _______________________________________ Date __________  Please bring this form and your medication list with you to all your follow-up doctor's appointments.

## 2015-06-03 NOTE — Progress Notes (Signed)
Occupational Therapy Session Note  Patient Details  Name: TRICA MCELREATH MRN: [id number] Date of Birth: 25-Oct-1924  Today's Date: 06/03/2015 OT Individual Time: [id number] and 1300-1330 OT Individual Time Calculation (min): 60 min and 30 min   Short Term Goals: Week 1:  OT Short Term Goal 1 (Week 1): Pt will complete stand pivot transfer to toilet with supervision OT Short Term Goal 2 (Week 1): Pt will complete 3/3 toileting tasks with steadying assist OT Short Term Goal 3 (Week 1): Pt will tolerate full bathing/dressing session with no more than 3 rest breaks OT Short Term Goal 4 (Week 1): Pt will complete bathing task with min A  Skilled Therapeutic Interventions/Progress Updates:    Session One: Pt seen for OT ADL bathing and dressing session. Pt sitting EOB upon arrival with RN present, agreeable to tx session. She voiced feeling weak and "dizzy" however with encouragement, pt willing to participate. She ambulated throughout room using RW with supervision. Functional transfers completed supervision- mod I with questioning cues for hand placement on RW. She bathed seated on shower chair with supervision, standing at grab bars to complete buttock hygiene/ pericare. She dressed seated EOB with increased time and encouragement to complete tasks independently vs asking for help before attempting. BP 122/61 during activity Education completed regarding OT goals, recommendations for home set-up, continuum of care,supervision assistance at d/c, WB restrictions, and d/c planning.   Session Two: Pt seen for OT session focusing on family education, functional transfers, and d/c planning. Pt sitting EOB upon arrival having just received lunch tray and daughter and son present. Extensive education provided regarding recommendations for DME, home set-up, supervision assist level, WB restrictions, continuum of care, and w/c parts management. She ambulated ~6 steps with RW to w/c.  In ADL apartment, pt  completed simulated shower stall transfer to shower chair. Completed with supervision and VCs for technique. Discussed recommendation for hand held shower head and grab bars. Declined practicing BSC transfers, voiced feeling understanding and comfort with transfers.  Pt left in w/c with hand off to PT.   Therapy Documentation Precautions:  Precautions Precautions: Fall Restrictions Weight Bearing Restrictions: Yes RLE Weight Bearing: Partial weight bearing RLE Partial Weight Bearing Percentage or Pounds: 50% Pain: Pain Assessment Pain Score: 3   See Function Navigator for Current Functional Status.   Therapy/Group: Individual Therapy  Lewis, Cesareo Vickrey C 06/03/2015, 7:03 AM

## 2015-06-03 NOTE — Progress Notes (Signed)
Occupational Therapy Discharge Summary  Patient Details  Name: Katrina Burnett MRN: [id number] Date of Birth: 1924-11-04   Patient has met 6 of 10 long term goals due to improved activity tolerance, improved balance, postural control, ability to compensate for deficits and improved coordination.  Patient to discharge at overall Supervision level.  Patient's care partner is independent to provide the necessary physical assistance at discharge.    Reasons goals not met: Pt requesting to move up d/c date and leave at overall supervision level vs extended stay to meet mod I goals. Pt's family/ caregivers are agreeable and able to provide appropriate supervision.   Recommend supervision with all dynamic standing tasks as well as bathing/ shower transfers. Family education completed. Included demonstration/ use of 3-1 BSC with recommendation for BSC at bed side at night and over toilet during the day. Simulated shower stall transfer also completed.   Recommendation:  Patient will benefit from ongoing skilled OT services in home health setting to continue to advance functional skills in the area of BADL, iADL and Reduce care partner burden.  Equipment: Shower chair and 3-1 BSC  Reasons for discharge: discharge from hospital  Patient/family agrees with progress made and goals achieved: Yes  OT Discharge Precautions/Restrictions  Precautions Precautions: Fall Restrictions Weight Bearing Restrictions: Yes RLE Weight Bearing: Partial weight bearing RLE Partial Weight Bearing Percentage or Pounds: 50% Vision/Perception  Vision- History Baseline Vision/History: Wears glasses Wears Glasses: Reading only Patient Visual Report: No change from baseline  Cognition Overall Cognitive Status: Within Functional Limits for tasks assessed Orientation Level: Oriented X4 Memory: Appears intact Awareness: Appears intact Problem Solving: Appears intact Safety/Judgment: Appears  intact Sensation Sensation Light Touch: Appears Intact Coordination Gross Motor Movements are Fluid and Coordinated: Yes Fine Motor Movements are Fluid and Coordinated: Yes (Can be uncoordinated/ shaky when pt is anxious or in severe pain) Motor  Motor Motor: Within Functional Limits Motor - Discharge Observations: generalized weakness and increased pain in RLE limiting Trunk/Postural Assessment  Cervical Assessment Cervical Assessment: Within Functional Limits Thoracic Assessment Thoracic Assessment: Exceptions to [facility] (Flexed posture) Lumbar Assessment Lumbar Assessment: Exceptions to [facility] (Posterior pelvic tilt) Postural Control Postural Control: Within Functional Limits  Balance Balance Balance Assessed: Yes Static Sitting Balance Static Sitting - Level of Assistance: 6: Modified independent (Device/Increase time) Dynamic Sitting Balance Dynamic Sitting - Balance Support: During functional activity;Feet supported Dynamic Sitting - Level of Assistance: 6: Modified independent (Device/Increase time) Static Standing Balance Static Standing - Balance Support: During functional activity;Right upper extremity supported;Left upper extremity supported Static Standing - Level of Assistance: 6: Modified independent (Device/Increase time) Dynamic Standing Balance Dynamic Standing - Balance Support: During functional activity;Right upper extremity supported;Left upper extremity supported Dynamic Standing - Level of Assistance: 6: Modified independent (Device/Increase time);5: Stand by assistance Extremity/Trunk Assessment RUE Assessment RUE Assessment: Within Functional Limits LUE Assessment LUE Assessment: Within Functional Limits   See Function Navigator for Current Functional Status.  Lewis, Marcial Pless C 06/03/2015, 7:25 AM

## 2015-06-03 NOTE — Patient Care Conference (Signed)
Inpatient RehabilitationTeam Conference and Plan of Care Update Date: 06/03/2015   Time: 2:00 PM    Patient Name: Katrina Burnett      Medical Record Number: [id number]  Date of Birth: 1925/03/19 Sex: Female         Room/Bed: 4W19C/4W19C-01 Payor Info: Payor: MEDICARE / Plan: MEDICARE PART A AND B / Product Type: *No Product type* /    Admitting Diagnosis: hip fx  Admit Date/Time:  05/26/2015  6:21 PM Admission Comments: No comment available   Primary Diagnosis:  <principal problem not specified> Principal Problem: <principal problem not specified>  Patient Active Problem List   Diagnosis Date Noted  . Fracture, intertrochanteric, right femur ([place]) 05/26/2015  . Closed fracture of femur ([place])   . Post-operative pain   . Acute blood loss anemia   . Adjustment disorder with mixed anxiety and depressed mood   . Falls   . Abnormality of gait   . Femur fracture, right, closed, initial encounter 05/23/2015  . Fracture of femoral neck, right, closed 05/22/2015  . Femoral neck fracture, right, closed, initial encounter 05/22/2015  . Syncope 05/30/2012  . Nausea & vomiting 05/30/2012  . Chest pain 05/30/2012  . Hypotension 05/30/2012  . Esophageal reflux 12/16/2011  . Lumbar compression fracture ([place])   . Polymyalgia [facility])     Expected Discharge Date: Expected Discharge Date: 06/04/15  Team Members Present: Physician leading conference: Dr. Alger Simons Social Worker Present: Lennart Pall, LCSW Nurse Present: Heather Roberts, RN PT Present: Canary Brim, PT OT Present: Napoleon Form, OT SLP Present: Weston Anna, SLP PPS Coordinator present : Daiva Nakayama, RN, CRRN     Current Status/Progress Goal Weekly Team Focus  Medical   pain control, anxiety better as is sleep.   finalize medical issues and meds for dc  see above, pain med mods,    Bowel/Bladder      Pt to remain continent of bowel and bladder  Monitor   Swallow/Nutrition/ Hydration             ADL's   Steadying assist-  supervision overall  Goals downgraded to supervision overall in order to  move up d/c date  d/c planning, functional transfers, education   Mobility   supervision to min assist overall  mod I transfers; supervision gait; min assist stairs and car  transfers, stairs, fam ed, d/c planning, endurance, strengthening   Communication             Safety/Cognition/ Behavioral Observations            Pain   Vicodin 1-2tabs prn q 6hrs. Schedule Robaxin 518m   <4  Decreased c/o pain   Skin   Skin tear to R elbow healing. Hip incision healing, unremarkable  No additional skin breakdown  Assess skin for appropriate healng q shift    Rehab Goals Patient on target to meet rehab goals: Yes Rehab Goals Revised: to meet revised goals of supervision *See Care Plan and progress notes for long and short-term goals.  Barriers to Discharge: see prior    Possible Resolutions to Barriers:  continued positive reinforcement    Discharge Planning/Teaching Needs:  home with family to provide 24/7 assistance (daughter to stay)  scheduled to take place today 1/24   Team Discussion:  Pt has made excellent gains and met supervision goals. Family education completed today.  Ready for dc tomorrow.  Revisions to Treatment Plan:  Most goals revised to supervision.  Change in d/c date to 06/04/15  Continued Need for Acute Rehabilitation Level of Care: The patient requires daily medical management by a physician with specialized training in physical medicine and rehabilitation for the following conditions: Daily direction of a multidisciplinary physical rehabilitation program to ensure safe treatment while eliciting the highest outcome that is of practical value to the patient.: Yes Daily medical management of patient stability for increased activity during participation in an intensive rehabilitation regime.: Yes Daily analysis of laboratory values and/or radiology reports with any subsequent need for medication  adjustment of medical intervention for : Post surgical problems;Neurological problems;Wound care problems  Kipling Graser 06/03/2015, 4:13 PM

## 2015-06-03 NOTE — Progress Notes (Signed)
Social Work Patient ID: Katrina Burnett, female   DOB: 11-23-1924, 82 y.o.   MRN: [id number]  Katrina Curb, LCSW Social Worker Signed  Patient Care Conference 06/03/2015  4:13 PM    Expand All Collapse All   Inpatient RehabilitationTeam Conference and Plan of Care Update Date: 06/03/2015   Time: 2:00 PM     Patient Name: Katrina Burnett       Medical Record Number: [id number]  Date of Birth: 27-Apr-1925 Sex: Female         Room/Bed: 4W19C/4W19C-01 Payor Info: Payor: MEDICARE / Plan: MEDICARE PART A AND B / Product Type: *No Product type* /    Admitting Diagnosis: hip fx   Admit Date/Time:  05/26/2015  6:21 PM Admission Comments: No comment available   Primary Diagnosis:  <principal problem not specified> Principal Problem: <principal problem not specified>    Patient Active Problem List     Diagnosis  Date Noted   .  Fracture, intertrochanteric, right femur (Katrina Burnett)  05/26/2015   .  Closed fracture of femur (Katrina Burnett)     .  Post-operative pain     .  Acute blood loss anemia     .  Adjustment disorder with mixed anxiety and depressed mood     .  Falls     .  Abnormality of gait     .  Femur fracture, right, closed, initial encounter  05/23/2015   .  Fracture of femoral neck, right, closed  05/22/2015   .  Femoral neck fracture, right, closed, initial encounter  05/22/2015   .  Syncope  05/30/2012   .  Nausea & vomiting  05/30/2012   .  Chest pain  05/30/2012   .  Hypotension  05/30/2012   .  Esophageal reflux  12/16/2011   .  Lumbar compression fracture (Katrina Burnett)     .  Polymyalgia Katrina Burnett)       Expected Discharge Date: Expected Discharge Date: 06/04/15  Team Members Present: Physician leading conference: Dr. Alger Simons Social Worker Present: Lennart Pall, LCSW Nurse Present: Katrina Roberts, RN PT Present: Katrina Burnett, PT OT Present: Katrina Burnett, OT SLP Present: Katrina Burnett, SLP PPS Coordinator present : Katrina Nakayama, RN, CRRN        Current Status/Progress  Goal  Weekly Team Focus    Medical     pain control, anxiety better as is sleep.   finalize medical issues and meds for dc   see above, pain med mods,    Bowel/Bladder        Pt to remain continent of bowel and bladder   Monitor   Swallow/Nutrition/ Hydration               ADL's     Steadying assist- supervision overall   Goals downgraded to supervision overall in order to  move up d/c date  d/c planning, functional transfers, education    Mobility     supervision to min assist overall  mod I transfers; supervision gait; min assist stairs and car   transfers, stairs, fam ed, d/c planning, endurance, strengthening   Communication               Safety/Cognition/ Behavioral Observations              Pain     Vicodin 1-2tabs prn q 6hrs. Schedule Robaxin '500mg'$    <4  Decreased c/o pain   Skin     Skin tear to R elbow healing. Hip incision  healing, unremarkable  No additional skin breakdown  Assess skin for appropriate healng q shift     Rehab Goals Patient on target to meet rehab goals: Yes Rehab Goals Revised: to meet revised goals of supervision *See Care Plan and progress notes for long and short-term goals.    Barriers to Discharge:  see prior     Possible Resolutions to Barriers:   continued positive reinforcement     Discharge Planning/Teaching Needs:   home with family to provide 24/7 assistance (daughter to stay)   scheduled to take place today 1/24    Team Discussion:    Pt has made excellent gains and met supervision goals. Family education completed today.  Ready for dc tomorrow.   Revisions to Treatment Plan:    Most goals revised to supervision.  Change in d/c date to 06/04/15    Continued Need for Acute Rehabilitation Level of Care: The patient requires daily medical management by a physician with specialized training in physical medicine and rehabilitation for the following conditions: Daily direction of a multidisciplinary physical rehabilitation program to ensure safe treatment while  eliciting the highest outcome that is of practical value to the patient.: Yes Daily medical management of patient stability for increased activity during participation in an intensive rehabilitation regime.: Yes Daily analysis of laboratory values and/or radiology reports with any subsequent need for medication adjustment of medical intervention for : Post surgical problems;Neurological problems;Wound care problems  Katrina Burnett 06/03/2015, 4:13 PM                 Katrina Curb, LCSW Social Worker Signed  Patient Care Conference 05/27/2015  3:25 PM    Expand All Collapse All   Inpatient RehabilitationTeam Conference and Plan of Care Update Date: 05/27/2015   Time: 2:10 PM     Patient Name: Katrina Burnett       Medical Record Number: [id number]  Date of Birth: 25-Nov-1924 Sex: Female         Room/Bed: 4W19C/4W19C-01 Payor Info: Payor: MEDICARE / Plan: MEDICARE PART A AND B / Product Type: *No Product type* /    Admitting Diagnosis: hip fx   Admit Date/Time:  05/26/2015  6:21 PM Admission Comments: No comment available   Primary Diagnosis:  <principal problem not specified> Principal Problem: <principal problem not specified>    Patient Active Problem List     Diagnosis  Date Noted   .  Fracture, intertrochanteric, right femur (Katrina Burnett)  05/26/2015   .  Closed fracture of femur (Katrina Burnett)     .  Post-operative pain     .  Acute blood loss anemia     .  Adjustment disorder with mixed anxiety and depressed mood     .  Falls     .  Abnormality of gait     .  Femur fracture, right, closed, initial encounter  05/23/2015   .  Fracture of femoral neck, right, closed  05/22/2015   .  Femoral neck fracture, right, closed, initial encounter  05/22/2015   .  Syncope  05/30/2012   .  Nausea & vomiting  05/30/2012   .  Chest pain  05/30/2012   .  Hypotension  05/30/2012   .  Esophageal reflux  12/16/2011   .  Lumbar compression fracture (Katrina Burnett)     .  Polymyalgia Katrina Burnett)       Expected Discharge Date:  Expected Discharge Date: 06/10/15  Team Members Present: Physician leading conference: Dr. Alger Simons Social  Worker Present: Lennart Pall, LCSW Nurse Present: Katrina Roberts, RN PT Present: Katrina Burnett, PT OT Present: Katrina Burnett, OT SLP Present: Katrina Burnett, SLP PPS Coordinator present : Katrina Nakayama, RN, CRRN        Current Status/Progress  Goal  Weekly Team Focus   Medical     right intertrochanteric hip fracture, polymyalgia, severe anxiety issues. pain  control anxiety for better therapy participation  anxiety control, wound care, sleep restoration   Bowel/Bladder     Incontinent of bladder x 1 during the noc. Continent of bowel. LBM 05/26/15  Pt to remain continent of bladder  Initiate time toileting q 2-3hrs   Swallow/Nutrition/ Hydration               ADL's     Min-mod functional transfers; min -total toileting depending on pain level  Mod I overall with supervision for shower transfers   Pain management, activity tolerance, ADL re-training   Mobility     min to mod assist overall; limited evaluation due to pain, anxiety, and dizziness  mod I to supervision overall for household distances for gait and transfers; min assist for stairs and car  pain management, transfers, gait training, endurance, strengthening, stair negotiation, balance   Communication               Safety/Cognition/ Behavioral Observations              Pain     Vicodin 1-2 tabs prn q 6hrs. Robaxin 510m q 6hrs prn  <4  Encourage therapy session to increase tolerance, and decrease need for pain   Skin     Skin tear to R elbow, R knee proximal and  lateral. Hip incision w/dermabond and Mepilex dressing  No additional skin breakdown  Assess skin q shift.  Monitor for approximate healing     Rehab Goals Patient on target to meet rehab goals: Yes *See Care Plan and progress notes for long and short-term goals.    Barriers to Discharge:  anxiety, pain issues     Possible Resolutions to Barriers:    neuropsych eval, ego support, pain control      Discharge Planning/Teaching Needs:   home with family to provide 24/7 assistance (daughter to stay)   to schedule prior to d/c.    Team Discussion:    New eval, hip fx, polymyalgia.  Tearful and anxious with MD this morning - very dramatic.  Limited participation with evals.  Therapists are setting mod i goals, however, dependent on her participation overall.   Revisions to Treatment Plan:    None    Continued Need for Acute Rehabilitation Level of Care: The patient requires daily medical management by a physician with specialized training in physical medicine and rehabilitation for the following conditions: Daily direction of a multidisciplinary physical rehabilitation program to ensure safe treatment while eliciting the highest outcome that is of practical value to the patient.: Yes Daily medical management of patient stability for increased activity during participation in an intensive rehabilitation regime.: Yes Daily analysis of laboratory values and/or radiology reports with any subsequent need for medication adjustment of medical intervention for : Post surgical problems;Mood/behavior problems;Other  Lamont Tant 05/28/2015, 10:50 AM

## 2015-06-03 NOTE — Progress Notes (Signed)
[place] PHYSICAL MEDICINE & REHABILITATION     PROGRESS NOTE    Subjective/Complaints: Feels that vicodin is stimulating her at night thus affecting her sleep---asked if we could try something different. Otherwise pleased that she's going home tomorrow!  ROS: Pt denies fever, rash/itching, headache, blurred or double vision, nausea, vomiting, abdominal pain, diarrhea, chest pain, shortness of breath, palpitations, dysuria, dizziness, +anxiety, pain   Objective: Vital Signs: Blood pressure 147/82, pulse 69, temperature 98.5 F (36.9 C), temperature source Oral, resp. rate 18, height 4\' 9"  (1.448 m), weight 44.951 kg (99 lb 1.6 oz), SpO2 98 %. No results found. No results for input(s): WBC, HGB, HCT, PLT in the last 72 hours. No results for input(s): NA, K, CL, GLUCOSE, BUN, CREATININE, CALCIUM in the last 72 hours.  Invalid input(s): CO CBG (last 3)  No results for input(s): GLUCAP in the last 72 hours.  Wt Readings from Last 3 Encounters:  05/28/15 44.951 kg (99 lb 1.6 oz)  08/14/13 43.999 kg (97 lb)  10/05/12 42.638 kg (94 lb)    Physical Exam:  Constitutional: She is oriented to person, place, and time. She appears well-developed and well-nourished.  HENT:  Head: Normocephalic and atraumatic.  Eyes: Conjunctivae and EOM are normal.  Neck: Normal range of motion. Neck supple. No thyromegaly present.  Cardiovascular: Normal rate and regular rhythm.  Respiratory: Effort normal and breath sounds normal. No respiratory distress.  GI: Soft. Bowel sounds are normal. She exhibits no distension.  Musculoskeletal: She exhibits edema and tenderness.  Neurological: She is alert and oriented to person, place, and time. She has normal reflexes.  Sensation intact to light touch Motor: B/L UE, LLE 5/5 proximal distal Right hip flexion remains limited by pain, knee extension 4/5, ankle dorsi/plantar flexion 5/5  Skin: Skin is warm and dry.  Hip incision clean and dry   Psychiatric: normal mood. No anxiety   Assessment/Plan: 1. Gait instability and functional deficits secondary to right IT hip fx which require 3+ hours per day of interdisciplinary therapy in a comprehensive inpatient rehab setting. Physiatrist is providing close team supervision and 24 hour management of active medical problems listed below. Physiatrist and rehab team continue to assess barriers to discharge/monitor patient progress toward functional and medical goals.  Function:  Bathing Bathing position   Position: Shower  Bathing parts Body parts bathed by patient: Right arm, Left arm, Chest, Abdomen, Front perineal area, Buttocks, Right upper leg, Left upper leg, Right lower leg, Left lower leg, Back Body parts bathed by helper: Back  Bathing assist Assist Level: Supervision or verbal cues      Upper Body Dressing/Undressing Upper body dressing   What is the patient wearing?: Pull over shirt/dress     Pull over shirt/dress - Perfomed by patient: Thread/unthread right sleeve, Thread/unthread left sleeve, Put head through opening, Pull shirt over trunk          Upper body assist Assist Level: Set up   Set up : To obtain clothing/put away  Lower Body Dressing/Undressing Lower body dressing   What is the patient wearing?: Underwear, Pants, Ted Hose, Non-skid slipper socks Underwear - Performed by patient: Thread/unthread right underwear leg, Thread/unthread left underwear leg, Pull underwear up/down Underwear - Performed by helper: Thread/unthread right underwear leg, Thread/unthread left underwear leg, Pull underwear up/down Pants- Performed by patient: Thread/unthread left pants leg, Pull pants up/down, Thread/unthread right pants leg Pants- Performed by helper: Thread/unthread right pants leg Non-skid slipper socks- Performed by patient: Don/doff right sock, Don/doff  left sock Non-skid slipper socks- Performed by helper: Don/doff right sock, Don/doff left sock                TED Hose - Performed by helper: Don/doff right TED hose, Don/doff left TED hose  Lower body assist Assist for lower body dressing: Supervision or verbal cues      Toileting Toileting   Toileting steps completed by patient: Adjust clothing prior to toileting, Performs perineal hygiene, Adjust clothing after toileting Toileting steps completed by helper: Performs perineal hygiene, Adjust clothing prior to toileting, Adjust clothing after toileting Toileting Assistive Devices: Grab bar or rail  Toileting assist Assist level: Supervision or verbal cues   Transfers Chair/bed transfer   Chair/bed transfer method: Stand pivot, Ambulatory Chair/bed transfer assist level: Supervision or verbal cues Chair/bed transfer assistive device: Armrests, Medical sales representative     Max distance: 50' Assist level: Touching or steadying assistance (Pt > 75%)   Wheelchair   Type: Manual Max wheelchair distance: 150 Assist Level: No help, No cues, assistive device, takes more than reasonable amount of time  Cognition Comprehension Comprehension assist level: Follows complex conversation/direction with extra time/assistive device  Expression Expression assist level: Expresses complex ideas: With extra time/assistive device  Social Interaction Social Interaction assist level: Interacts appropriately 90% of the time - Needs monitoring or encouragement for participation or interaction.  Problem Solving Problem solving assist level: Solves basic 90% of the time/requires cueing < 10% of the time  Memory Memory assist level: Recognizes or recalls 75 - 89% of the time/requires cueing 10 - 24% of the time   Medical Problem List and Plan: 1. Gait instability secondary to right intertrochanteric hip fracture. Status post ORIF. 50% weightbearing.  -team conference today  -aiming toward dc tomorrow 2. DVT Prophylaxis/Anticoagulation: Lovenox. Monitor platelet counts of any signs of bleeding.    -dopplers negative 3. Pain Management/polymyalgia: Hydrocodone and Robaxin as needed.   -added tramadol today----see if this is less "stimulating" for her to use at night  -scheduled hydrocodone each am at 0600 and scheduled tid robaxin  -anxiety impact  -scheduled ice to hip 4. Mood: Celexa 20 mg daily, Ativan 1 mg twice a day per home regimen--would like to avoid further benzos if possible  -continue daily emotional support  -neuropsych assessment appreciated  -  prn xanax   - scheduled low dose trazodone at hs, 25mg  . 5. Neuropsych: This patient is capable of making decisions on her own behalf. 6. Skin/Wound Care: Routine skin checks---dc staples tomorrow 7. Fluids/Electrolytes/Nutrition:     -encourage PO 8. Acute blood loss anemia. Follow up hgb stable and back to 10.3.    -recheck tomorrow prior to dc LOS (Days) 8 A FACE TO FACE EVALUATION WAS PERFORMED  Kyleigha Markert T 06/03/2015 9:18 AM

## 2015-06-03 NOTE — Discharge Summary (Signed)
Discharge summary job [phone]

## 2015-06-03 NOTE — Progress Notes (Signed)
Physical Therapy Discharge Summary  Patient Details  Name: Katrina Burnett MRN: [id number] Date of Birth: 19-May-1924  Today's Date: 06/03/2015 PT Individual Time: 1330-1355 PT Individual Time Calculation (min): 25 min  Reports pain/soreness in R hip -premedicated. Session focused on family education with pt's daughter, [name] and son, [name] with demonstration and education on stair negotiation at min assist level, WB status, functional transfers, endurance, gait distances vs use of w/c, and w/c parts management/breakdown. Family denies any concerns and pt denies questions as well. Reviewed HEP with pt's family also.      Patient has met 9 of 9 long term goals due to improved activity tolerance, improved balance, increased strength, increased range of motion, decreased pain, ability to compensate for deficits and functional use of  right lower extremity.  Patient to discharge at household/short distance ambulatory level at supervision/modified independent with RW and supervision/mod I w/c level for longer distances.   Patient's care partner is independent to provide the necessary physical and supervision assistance at discharge.  Reasons goals not met: n/a - all goals met at this time.  Recommendation:  Patient will benefit from ongoing skilled PT services in home health setting to continue to advance safe functional mobility, address ongoing impairments in gait, balance, strength, pain, ROM, endurance functional mobility, and minimize fall risk.  Equipment: 16x16 w/c with elevating legrests and basic cushion. Pt already has RW.  Reasons for discharge: treatment goals met and discharge from hospital  Patient/family agrees with progress made and goals achieved: Yes  PT Discharge Precautions/Restrictions Precautions Precautions: Fall Restrictions RLE Weight Bearing: Partial weight bearing RLE Partial Weight Bearing Percentage or Pounds: 50%  Cognition Overall Cognitive Status: Within  Functional Limits for tasks assessed Orientation Level: Oriented X4 Memory: Appears intact Awareness: Appears intact Problem Solving: Appears intact Safety/Judgment: Appears intact Sensation Sensation Light Touch: Appears Intact Coordination Gross Motor Movements are Fluid and Coordinated: Yes Motor  Motor Motor: Within Functional Limits Motor - Discharge Observations: generalized weakness and increased pain in RLE limiting   Trunk/Postural Assessment  Cervical Assessment Cervical Assessment: Within Functional Limits Thoracic Assessment Thoracic Assessment:  (flexed posture) Lumbar Assessment Lumbar Assessment:  (posterior tilt)  Balance Balance Balance Assessed: Yes Static Sitting Balance Static Sitting - Level of Assistance: 6: Modified independent (Device/Increase time) Dynamic Sitting Balance Dynamic Sitting - Balance Support: During functional activity;Feet supported Dynamic Sitting - Level of Assistance: 6: Modified independent (Device/Increase time) Static Standing Balance Static Standing - Balance Support: During functional activity;Right upper extremity supported;Left upper extremity supported Static Standing - Level of Assistance: 6: Modified independent (Device/Increase time) Dynamic Standing Balance Dynamic Standing - Balance Support: During functional activity;Right upper extremity supported;Left upper extremity supported Dynamic Standing - Level of Assistance: 6: Modified independent (Device/Increase time);5: Stand by assistance Extremity Assessment   see OT d/c summary for details   RLE Assessment RLE Assessment: Exceptions to [facility] RLE Strength RLE Overall Strength Comments: grossly 3/5 at hip though limited by pain; knee and ankle WFL LLE Assessment LLE Assessment: Within Functional Limits (decreased muscular endurance)   See Function Navigator for Current Functional Status.  Canary Brim Ivory Broad, PT, DPT  06/03/2015, 2:31 PM

## 2015-06-03 NOTE — Progress Notes (Signed)
Physical Therapy Session Note  Patient Details  Name: Katrina Burnett MRN: [id number] Date of Birth: May 18, 1924  Today's Date: 06/03/2015 PT Individual Time: [id number] PT Individual Time Calculation (min): 80 min   Short Term Goals: Week 1:  PT Short Term Goal 1 (Week 1): Pt will perform bed mobility from flat surface with supervision PT Short Term Goal 2 (Week 1): Pt will be able to perform basic transfers with supervision PT Short Term Goal 3 (Week 1): Pt will be able to perform stair negotiation with mod assist to prepare for home entry PT Short Term Goal 4 (Week 1): Pt will be able to gait x 50' with supervision  Skilled Therapeutic Interventions/Progress Updates:    Session focused on functional gait in room, on unit, and in ADL apartment with RW at overall supervision level, various transfers including bed, toilet, simulated car, and low couch for functional mobility training at supervision to mod I level with RW, w/c mobility training on unit at modified independent level for longer distances, and supine therex for BLE strengthening and ROM. Pt became lightheaded during gait during longer distance and BP was low - pt reports this occurred since taking a new medication today. See Doc Flow sheets for assessments in various positions. Symptoms resolved with supine positioning. Notified RN. Therefore, performed supine therex including SAQ, ankle pumps, heel slides, and hip abduction x 15 reps each BLE. Returned back to bed end of session to rest.  Therapy Documentation Precautions:  Precautions Precautions: Fall Restrictions Weight Bearing Restrictions: Yes RLE Weight Bearing: Partial weight bearing RLE Partial Weight Bearing Percentage or Pounds: 50%  Pain: Reports pain in hip is better - premedicated.    See Function Navigator for Current Functional Status.   Therapy/Group: Individual Therapy  Canary Brim Ivory Broad, PT, DPT  06/03/2015, 11:13 AM

## 2015-06-03 NOTE — Progress Notes (Signed)
Social Work Patient ID: Katrina Burnett, female   DOB: Jul 22, 1924, 82 y.o.   MRN: [id number]   Met with pt, son and daughter while they were completing family ed sessions.  All pleased with progress and agreeable with the change in d/c date to 06/04/15.  Reviewed DME and HH being arranged.  No changes to plan in team conference.  Pt ready for d/c tomorrow.   [name], LCSW

## 2015-06-04 MED ORDER — HYDROCODONE-ACETAMINOPHEN 5-325 MG PO TABS: 1.0000 | ORAL_TABLET | Freq: Four times a day (QID) | ORAL | Status: DC | PRN

## 2015-06-04 MED ORDER — ALPRAZOLAM 0.25 MG PO TABS: 0.2500 mg | ORAL_TABLET | Freq: Three times a day (TID) | ORAL | Status: DC | PRN

## 2015-06-04 MED ORDER — TRAMADOL HCL 50 MG PO TABS: 50.0000 mg | ORAL_TABLET | Freq: Four times a day (QID) | ORAL | Status: DC | PRN

## 2015-06-04 MED ORDER — METHOCARBAMOL 500 MG PO TABS: 500.0000 mg | ORAL_TABLET | Freq: Three times a day (TID) | ORAL | Status: DC | PRN

## 2015-06-04 MED ORDER — TRAZODONE HCL 50 MG PO TABS: 25.0000 mg | ORAL_TABLET | Freq: Every day | ORAL | Status: DC

## 2015-06-04 MED ORDER — CITALOPRAM HYDROBROMIDE 40 MG PO TABS: 20.0000 mg | ORAL_TABLET | Freq: Every day | ORAL | Status: DC

## 2015-06-04 NOTE — Progress Notes (Signed)
Patient with patient's son at bedside given discharge information by Helyn Numbers PA, all questions answered. Patient (Katrina Burnett) hip incision derma bonded dressing changed placed an Allevyn dressing. Patient assisted to car via wheelchair with nurse tech assistance. [place]

## 2015-06-04 NOTE — Progress Notes (Signed)
Social Work  Discharge Note  The overall goal for the admission was met for:   Discharge location: Yes - home with family to provide 24/7 assistance/ supervision  Length of Stay: Yes - actually able to d/c earlier than originally targeted - LOS = 9 days  Discharge activity level: Yes - supervision  Home/community participation: Yes  Services provided included: MD, RD, PT, OT, RN, Pharmacy, [place]: Medicare and Private Insurance: [employer]  Follow-up services arranged: Home Health: PT, OT via [place], DME: 16x16 lightweight w/c with ELRs, basic cushion, 3n1 commode and tub seat via [facility] and Patient/Family has no preference for HH/DME agencies  Comments (or additional information):  Patient/Family verbalized understanding of follow-up arrangements: Yes  Individual responsible for coordination of the follow-up plan: pt  Confirmed correct DME delivered: Maayan Jenning 06/04/2015    Zakari Bathe

## 2015-06-04 NOTE — Progress Notes (Signed)
[facility] PHYSICAL MEDICINE & REHABILITATION     PROGRESS NOTE    Subjective/Complaints: In good spirits. Extremely happy to be going home. Appreciative of everyone's efforts. Asked if she needed a rx for barrier cream  ROS: Pt denies fever, rash/itching, headache, blurred or double vision, nausea, vomiting, abdominal pain, diarrhea, chest pain, shortness of breath, palpitations, dysuria, dizziness, +anxiety, pain   Objective: Vital Signs: Blood pressure 128/61, pulse 73, temperature 98.1 F (36.7 C), temperature source Oral, resp. rate 18, height 4\' 9"  (1.448 m), weight 44.951 kg (99 lb 1.6 oz), SpO2 94 %. No results found. No results for input(s): WBC, HGB, HCT, PLT in the last 72 hours. No results for input(s): NA, K, CL, GLUCOSE, BUN, CREATININE, CALCIUM in the last 72 hours.  Invalid input(s): CO CBG (last 3)  No results for input(s): GLUCAP in the last 72 hours.  Wt Readings from Last 3 Encounters:  05/28/15 44.951 kg (99 lb 1.6 oz)  08/14/13 43.999 kg (97 lb)  10/05/12 42.638 kg (94 lb)    Physical Exam:  Constitutional: She is oriented to person, place, and time. She appears well-developed and well-nourished.  HENT:  Head: Normocephalic and atraumatic.  Eyes: Conjunctivae and EOM are normal.  Neck: Normal range of motion. Neck supple. No thyromegaly present.  Cardiovascular: Normal rate and regular rhythm.  Respiratory: Effort normal and breath sounds normal. No respiratory distress.  GI: Soft. Bowel sounds are normal. She exhibits no distension.  Musculoskeletal: She exhibits edema and tenderness.  Neurological: She is alert and oriented to person, place, and time. She has normal reflexes.  Sensation intact to light touch Motor: B/L UE, LLE 5/5 proximal distal Right hip flexion remains limited by pain, knee extension 4/5, ankle dorsi/plantar flexion 5/5  Skin: Skin is warm and dry.  Hip incision clean and dry---foam dressing reapplied Psychiatric:  normal mood. No anxiety   Assessment/Plan: 1. Gait instability and functional deficits secondary to right IT hip fx which require 3+ hours per day of interdisciplinary therapy in a comprehensive inpatient rehab setting. Physiatrist is providing close team supervision and 24 hour management of active medical problems listed below. Physiatrist and rehab team continue to assess barriers to discharge/monitor patient progress toward functional and medical goals.  Function:  Bathing Bathing position   Position: Shower  Bathing parts Body parts bathed by patient: Right arm, Left arm, Chest, Abdomen, Front perineal area, Buttocks, Right upper leg, Left upper leg, Right lower leg, Left lower leg, Back Body parts bathed by helper: Back  Bathing assist Assist Level: Supervision or verbal cues      Upper Body Dressing/Undressing Upper body dressing   What is the patient wearing?: Pull over shirt/dress     Pull over shirt/dress - Perfomed by patient: Thread/unthread right sleeve, Thread/unthread left sleeve, Put head through opening, Pull shirt over trunk          Upper body assist Assist Level: Set up   Set up : To obtain clothing/put away  Lower Body Dressing/Undressing Lower body dressing   What is the patient wearing?: Underwear, Pants, Ted Hose, Non-skid slipper socks Underwear - Performed by patient: Thread/unthread right underwear leg, Thread/unthread left underwear leg, Pull underwear up/down Underwear - Performed by helper: Thread/unthread right underwear leg, Thread/unthread left underwear leg, Pull underwear up/down Pants- Performed by patient: Thread/unthread left pants leg, Pull pants up/down, Thread/unthread right pants leg Pants- Performed by helper: Thread/unthread right pants leg Non-skid slipper socks- Performed by patient: Don/doff right sock, Don/doff left sock  Non-skid slipper socks- Performed by helper: Don/doff right sock, Don/doff left sock               TED  Hose - Performed by helper: Don/doff right TED hose, Don/doff left TED hose  Lower body assist Assist for lower body dressing: Supervision or verbal cues      Toileting Toileting   Toileting steps completed by patient: Adjust clothing prior to toileting, Performs perineal hygiene, Adjust clothing after toileting Toileting steps completed by helper: Performs perineal hygiene, Adjust clothing prior to toileting, Adjust clothing after toileting [place]: Grab bar or rail  Toileting assist Assist level: Supervision or verbal cues   Transfers Chair/bed transfer   Chair/bed transfer method: Stand pivot Chair/bed transfer assist level: No Help, no cues, assistive device, takes more than a reasonable amount of time Chair/bed transfer assistive device: Armrests, Medical sales representative     Max distance: 50' Assist level: Supervision or verbal cues   Wheelchair   Type: Manual Max wheelchair distance: 150 Assist Level: No help, No cues, assistive device, takes more than reasonable amount of time  Cognition Comprehension Comprehension assist level: Follows complex conversation/direction with extra time/assistive device  Expression Expression assist level: Expresses complex ideas: With extra time/assistive device  Social Interaction Social Interaction assist level: Interacts appropriately 90% of the time - Needs monitoring or encouragement for participation or interaction.  Problem Solving Problem solving assist level: Solves complex 90% of the time/cues < 10% of the time  Memory Memory assist level: Recognizes or recalls 75 - 89% of the time/requires cueing 10 - 24% of the time   Medical Problem List and Plan: 1. Gait instability secondary to right intertrochanteric hip fracture. Status post ORIF. 50% weightbearing.  -family ed completed  -follow up arranged. Will need to call her surgeon for follow up appt 2. DVT Prophylaxis/Anticoagulation: Lovenox. Monitor  platelet counts of any signs of bleeding.   -dopplers negative 3. Pain Management/polymyalgia: Hydrocodone and Robaxin as needed.   -added tramadol today----see if this is less "stimulating" for her to use at night  -scheduled hydrocodone each am at 0600 and scheduled tid robaxin  -anxiety impact  -scheduled ice to hip 4. Mood: Celexa 20 mg daily, Ativan 1 mg twice a day per home regimen--would like to avoid further benzos if possible  -continue daily emotional support  -neuropsych assessment appreciated  -  prn xanax   - scheduled low dose trazodone at hs, 25mg --can have an rx for home if she would like . 5. Neuropsych: This patient is capable of making decisions on her own behalf. 6. Skin/Wound Care: Routine skin checks---may use foam dressing for protection if she would like 7. Fluids/Electrolytes/Nutrition:     -encourage PO 8. Acute blood loss anemia. Follow up hgb stable and back to 10.3.    - LOS (Days) 9 A FACE TO FACE EVALUATION WAS PERFORMED  SWARTZ,ZACHARY T 06/04/2015 9:21 AM

## 2015-06-05 DIAGNOSIS — F329 Major depressive disorder, single episode, unspecified: Secondary | ICD-10-CM | POA: Diagnosis not present

## 2015-06-05 DIAGNOSIS — Z9181 History of falling: Secondary | ICD-10-CM | POA: Diagnosis not present

## 2015-06-05 DIAGNOSIS — M353 Polymyalgia rheumatica: Secondary | ICD-10-CM | POA: Diagnosis not present

## 2015-06-05 DIAGNOSIS — S72141D Displaced intertrochanteric fracture of right femur, subsequent encounter for closed fracture with routine healing: Secondary | ICD-10-CM | POA: Diagnosis not present

## 2015-06-05 DIAGNOSIS — M810 Age-related osteoporosis without current pathological fracture: Secondary | ICD-10-CM | POA: Diagnosis not present

## 2015-06-05 DIAGNOSIS — K219 Gastro-esophageal reflux disease without esophagitis: Secondary | ICD-10-CM | POA: Diagnosis not present

## 2015-06-05 DIAGNOSIS — K5790 Diverticulosis of intestine, part unspecified, without perforation or abscess without bleeding: Secondary | ICD-10-CM | POA: Diagnosis not present

## 2015-06-06 DIAGNOSIS — K5790 Diverticulosis of intestine, part unspecified, without perforation or abscess without bleeding: Secondary | ICD-10-CM | POA: Diagnosis not present

## 2015-06-06 DIAGNOSIS — M353 Polymyalgia rheumatica: Secondary | ICD-10-CM | POA: Diagnosis not present

## 2015-06-06 DIAGNOSIS — S72141D Displaced intertrochanteric fracture of right femur, subsequent encounter for closed fracture with routine healing: Secondary | ICD-10-CM | POA: Diagnosis not present

## 2015-06-06 DIAGNOSIS — F329 Major depressive disorder, single episode, unspecified: Secondary | ICD-10-CM | POA: Diagnosis not present

## 2015-06-06 DIAGNOSIS — K219 Gastro-esophageal reflux disease without esophagitis: Secondary | ICD-10-CM | POA: Diagnosis not present

## 2015-06-06 DIAGNOSIS — M810 Age-related osteoporosis without current pathological fracture: Secondary | ICD-10-CM | POA: Diagnosis not present

## 2015-06-07 ENCOUNTER — Telehealth: Payer: Self-pay

## 2015-06-07 NOTE — Telephone Encounter (Signed)
Herbert Deaner- PT with [facility]- called for some verbal orders. He has started care with this pt and would like visits starting with 2wk5 and 1wk2 for fall risk. Also would like a verbal order for a skilled nursing eval for medication management. The pt and her caregiver was confused about which medications to take and how. He would also like a Education officer, museum for [employer]. There is also a pending OT eval.   Clair Gulling also noticed some discrepancies with the pt's medications. Pt states that she was on Lovenox injections, and she does not know whether to take them or not. Also, she was confused on how to take the Hydrocodone. Clair Gulling did reinforce the pt to take it per D/C instructions from the hospital. She also is not taking her Align or lansoprazole. Instead, she is taking Nexium 40mg .

## 2015-06-08 DIAGNOSIS — K219 Gastro-esophageal reflux disease without esophagitis: Secondary | ICD-10-CM | POA: Diagnosis not present

## 2015-06-08 DIAGNOSIS — K5790 Diverticulosis of intestine, part unspecified, without perforation or abscess without bleeding: Secondary | ICD-10-CM | POA: Diagnosis not present

## 2015-06-08 DIAGNOSIS — M810 Age-related osteoporosis without current pathological fracture: Secondary | ICD-10-CM | POA: Diagnosis not present

## 2015-06-08 DIAGNOSIS — M353 Polymyalgia rheumatica: Secondary | ICD-10-CM | POA: Diagnosis not present

## 2015-06-08 DIAGNOSIS — S72141D Displaced intertrochanteric fracture of right femur, subsequent encounter for closed fracture with routine healing: Secondary | ICD-10-CM | POA: Diagnosis not present

## 2015-06-08 DIAGNOSIS — F329 Major depressive disorder, single episode, unspecified: Secondary | ICD-10-CM | POA: Diagnosis not present

## 2015-06-09 DIAGNOSIS — M353 Polymyalgia rheumatica: Secondary | ICD-10-CM | POA: Diagnosis not present

## 2015-06-09 DIAGNOSIS — F329 Major depressive disorder, single episode, unspecified: Secondary | ICD-10-CM | POA: Diagnosis not present

## 2015-06-09 DIAGNOSIS — M810 Age-related osteoporosis without current pathological fracture: Secondary | ICD-10-CM | POA: Diagnosis not present

## 2015-06-09 DIAGNOSIS — K5790 Diverticulosis of intestine, part unspecified, without perforation or abscess without bleeding: Secondary | ICD-10-CM | POA: Diagnosis not present

## 2015-06-09 DIAGNOSIS — S72141D Displaced intertrochanteric fracture of right femur, subsequent encounter for closed fracture with routine healing: Secondary | ICD-10-CM | POA: Diagnosis not present

## 2015-06-09 DIAGNOSIS — K219 Gastro-esophageal reflux disease without esophagitis: Secondary | ICD-10-CM | POA: Diagnosis not present

## 2015-06-09 NOTE — Telephone Encounter (Signed)
Left message with Katrina Burnett to approve all services requested. Also let him know the clarification of her meds.

## 2015-06-09 NOTE — Telephone Encounter (Signed)
Pt may have all services requested.  Pt was not sent home on lovenox---she should not be taking  Hydrocodone should be exactly as it was before she came to the hospital---she took this long before she was ever admitted to rehab. If she wants direction regarding this, then I would recommend one tab q6 prn as its written.  Nexium is fine to take.

## 2015-06-11 ENCOUNTER — Telehealth: Payer: Self-pay | Admitting: *Deleted

## 2015-06-11 DIAGNOSIS — S72141D Displaced intertrochanteric fracture of right femur, subsequent encounter for closed fracture with routine healing: Secondary | ICD-10-CM | POA: Diagnosis not present

## 2015-06-11 DIAGNOSIS — K5790 Diverticulosis of intestine, part unspecified, without perforation or abscess without bleeding: Secondary | ICD-10-CM | POA: Diagnosis not present

## 2015-06-11 DIAGNOSIS — M353 Polymyalgia rheumatica: Secondary | ICD-10-CM | POA: Diagnosis not present

## 2015-06-11 DIAGNOSIS — K219 Gastro-esophageal reflux disease without esophagitis: Secondary | ICD-10-CM | POA: Diagnosis not present

## 2015-06-11 DIAGNOSIS — M810 Age-related osteoporosis without current pathological fracture: Secondary | ICD-10-CM | POA: Diagnosis not present

## 2015-06-11 DIAGNOSIS — F329 Major depressive disorder, single episode, unspecified: Secondary | ICD-10-CM | POA: Diagnosis not present

## 2015-06-11 NOTE — Telephone Encounter (Signed)
Out of xanax and needs refill.  I spoke with family and she will need to get refills from the previous prescriber on her medications.  Family thought so and will call their office.

## 2015-06-13 DIAGNOSIS — K219 Gastro-esophageal reflux disease without esophagitis: Secondary | ICD-10-CM | POA: Diagnosis not present

## 2015-06-13 DIAGNOSIS — M353 Polymyalgia rheumatica: Secondary | ICD-10-CM | POA: Diagnosis not present

## 2015-06-13 DIAGNOSIS — M810 Age-related osteoporosis without current pathological fracture: Secondary | ICD-10-CM | POA: Diagnosis not present

## 2015-06-13 DIAGNOSIS — F329 Major depressive disorder, single episode, unspecified: Secondary | ICD-10-CM | POA: Diagnosis not present

## 2015-06-13 DIAGNOSIS — K5790 Diverticulosis of intestine, part unspecified, without perforation or abscess without bleeding: Secondary | ICD-10-CM | POA: Diagnosis not present

## 2015-06-13 DIAGNOSIS — S72141D Displaced intertrochanteric fracture of right femur, subsequent encounter for closed fracture with routine healing: Secondary | ICD-10-CM | POA: Diagnosis not present

## 2015-06-16 DIAGNOSIS — F419 Anxiety disorder, unspecified: Secondary | ICD-10-CM | POA: Diagnosis not present

## 2015-06-16 DIAGNOSIS — Z09 Encounter for follow-up examination after completed treatment for conditions other than malignant neoplasm: Secondary | ICD-10-CM | POA: Diagnosis not present

## 2015-06-16 DIAGNOSIS — G4700 Insomnia, unspecified: Secondary | ICD-10-CM | POA: Diagnosis not present

## 2015-06-16 DIAGNOSIS — R52 Pain, unspecified: Secondary | ICD-10-CM | POA: Diagnosis not present

## 2015-06-17 DIAGNOSIS — K5790 Diverticulosis of intestine, part unspecified, without perforation or abscess without bleeding: Secondary | ICD-10-CM | POA: Diagnosis not present

## 2015-06-17 DIAGNOSIS — M810 Age-related osteoporosis without current pathological fracture: Secondary | ICD-10-CM | POA: Diagnosis not present

## 2015-06-17 DIAGNOSIS — F329 Major depressive disorder, single episode, unspecified: Secondary | ICD-10-CM | POA: Diagnosis not present

## 2015-06-17 DIAGNOSIS — M353 Polymyalgia rheumatica: Secondary | ICD-10-CM | POA: Diagnosis not present

## 2015-06-17 DIAGNOSIS — K219 Gastro-esophageal reflux disease without esophagitis: Secondary | ICD-10-CM | POA: Diagnosis not present

## 2015-06-17 DIAGNOSIS — S72141D Displaced intertrochanteric fracture of right femur, subsequent encounter for closed fracture with routine healing: Secondary | ICD-10-CM | POA: Diagnosis not present

## 2015-06-18 DIAGNOSIS — M353 Polymyalgia rheumatica: Secondary | ICD-10-CM | POA: Diagnosis not present

## 2015-06-18 DIAGNOSIS — F329 Major depressive disorder, single episode, unspecified: Secondary | ICD-10-CM | POA: Diagnosis not present

## 2015-06-18 DIAGNOSIS — M810 Age-related osteoporosis without current pathological fracture: Secondary | ICD-10-CM | POA: Diagnosis not present

## 2015-06-18 DIAGNOSIS — K5790 Diverticulosis of intestine, part unspecified, without perforation or abscess without bleeding: Secondary | ICD-10-CM | POA: Diagnosis not present

## 2015-06-18 DIAGNOSIS — S72141D Displaced intertrochanteric fracture of right femur, subsequent encounter for closed fracture with routine healing: Secondary | ICD-10-CM | POA: Diagnosis not present

## 2015-06-18 DIAGNOSIS — K219 Gastro-esophageal reflux disease without esophagitis: Secondary | ICD-10-CM | POA: Diagnosis not present

## 2015-06-19 DIAGNOSIS — F329 Major depressive disorder, single episode, unspecified: Secondary | ICD-10-CM | POA: Diagnosis not present

## 2015-06-19 DIAGNOSIS — K5790 Diverticulosis of intestine, part unspecified, without perforation or abscess without bleeding: Secondary | ICD-10-CM | POA: Diagnosis not present

## 2015-06-19 DIAGNOSIS — S72141D Displaced intertrochanteric fracture of right femur, subsequent encounter for closed fracture with routine healing: Secondary | ICD-10-CM | POA: Diagnosis not present

## 2015-06-19 DIAGNOSIS — N390 Urinary tract infection, site not specified: Secondary | ICD-10-CM | POA: Diagnosis not present

## 2015-06-19 DIAGNOSIS — K219 Gastro-esophageal reflux disease without esophagitis: Secondary | ICD-10-CM | POA: Diagnosis not present

## 2015-06-19 DIAGNOSIS — M810 Age-related osteoporosis without current pathological fracture: Secondary | ICD-10-CM | POA: Diagnosis not present

## 2015-06-19 DIAGNOSIS — M353 Polymyalgia rheumatica: Secondary | ICD-10-CM | POA: Diagnosis not present

## 2015-06-20 DIAGNOSIS — K219 Gastro-esophageal reflux disease without esophagitis: Secondary | ICD-10-CM | POA: Diagnosis not present

## 2015-06-20 DIAGNOSIS — M353 Polymyalgia rheumatica: Secondary | ICD-10-CM | POA: Diagnosis not present

## 2015-06-20 DIAGNOSIS — M810 Age-related osteoporosis without current pathological fracture: Secondary | ICD-10-CM | POA: Diagnosis not present

## 2015-06-20 DIAGNOSIS — S72141D Displaced intertrochanteric fracture of right femur, subsequent encounter for closed fracture with routine healing: Secondary | ICD-10-CM | POA: Diagnosis not present

## 2015-06-20 DIAGNOSIS — F329 Major depressive disorder, single episode, unspecified: Secondary | ICD-10-CM | POA: Diagnosis not present

## 2015-06-20 DIAGNOSIS — K5790 Diverticulosis of intestine, part unspecified, without perforation or abscess without bleeding: Secondary | ICD-10-CM | POA: Diagnosis not present

## 2015-06-21 DIAGNOSIS — F329 Major depressive disorder, single episode, unspecified: Secondary | ICD-10-CM | POA: Diagnosis not present

## 2015-06-21 DIAGNOSIS — S72141D Displaced intertrochanteric fracture of right femur, subsequent encounter for closed fracture with routine healing: Secondary | ICD-10-CM | POA: Diagnosis not present

## 2015-06-21 DIAGNOSIS — M810 Age-related osteoporosis without current pathological fracture: Secondary | ICD-10-CM | POA: Diagnosis not present

## 2015-06-21 DIAGNOSIS — M353 Polymyalgia rheumatica: Secondary | ICD-10-CM | POA: Diagnosis not present

## 2015-06-21 DIAGNOSIS — K219 Gastro-esophageal reflux disease without esophagitis: Secondary | ICD-10-CM | POA: Diagnosis not present

## 2015-06-21 DIAGNOSIS — K5790 Diverticulosis of intestine, part unspecified, without perforation or abscess without bleeding: Secondary | ICD-10-CM | POA: Diagnosis not present

## 2015-06-23 DIAGNOSIS — F329 Major depressive disorder, single episode, unspecified: Secondary | ICD-10-CM | POA: Diagnosis not present

## 2015-06-23 DIAGNOSIS — S72141D Displaced intertrochanteric fracture of right femur, subsequent encounter for closed fracture with routine healing: Secondary | ICD-10-CM | POA: Diagnosis not present

## 2015-06-23 DIAGNOSIS — M353 Polymyalgia rheumatica: Secondary | ICD-10-CM | POA: Diagnosis not present

## 2015-06-23 DIAGNOSIS — M810 Age-related osteoporosis without current pathological fracture: Secondary | ICD-10-CM | POA: Diagnosis not present

## 2015-06-23 DIAGNOSIS — K219 Gastro-esophageal reflux disease without esophagitis: Secondary | ICD-10-CM | POA: Diagnosis not present

## 2015-06-23 DIAGNOSIS — K5790 Diverticulosis of intestine, part unspecified, without perforation or abscess without bleeding: Secondary | ICD-10-CM | POA: Diagnosis not present

## 2015-06-24 DIAGNOSIS — S72141D Displaced intertrochanteric fracture of right femur, subsequent encounter for closed fracture with routine healing: Secondary | ICD-10-CM | POA: Diagnosis not present

## 2015-06-24 DIAGNOSIS — M353 Polymyalgia rheumatica: Secondary | ICD-10-CM | POA: Diagnosis not present

## 2015-06-24 DIAGNOSIS — M810 Age-related osteoporosis without current pathological fracture: Secondary | ICD-10-CM | POA: Diagnosis not present

## 2015-06-24 DIAGNOSIS — F329 Major depressive disorder, single episode, unspecified: Secondary | ICD-10-CM | POA: Diagnosis not present

## 2015-06-24 DIAGNOSIS — K5790 Diverticulosis of intestine, part unspecified, without perforation or abscess without bleeding: Secondary | ICD-10-CM | POA: Diagnosis not present

## 2015-06-24 DIAGNOSIS — K219 Gastro-esophageal reflux disease without esophagitis: Secondary | ICD-10-CM | POA: Diagnosis not present

## 2015-06-25 DIAGNOSIS — F329 Major depressive disorder, single episode, unspecified: Secondary | ICD-10-CM | POA: Diagnosis not present

## 2015-06-25 DIAGNOSIS — K219 Gastro-esophageal reflux disease without esophagitis: Secondary | ICD-10-CM | POA: Diagnosis not present

## 2015-06-25 DIAGNOSIS — F419 Anxiety disorder, unspecified: Secondary | ICD-10-CM | POA: Diagnosis not present

## 2015-06-25 DIAGNOSIS — M25551 Pain in right hip: Secondary | ICD-10-CM | POA: Diagnosis not present

## 2015-06-26 DIAGNOSIS — K5790 Diverticulosis of intestine, part unspecified, without perforation or abscess without bleeding: Secondary | ICD-10-CM | POA: Diagnosis not present

## 2015-06-26 DIAGNOSIS — F329 Major depressive disorder, single episode, unspecified: Secondary | ICD-10-CM | POA: Diagnosis not present

## 2015-06-26 DIAGNOSIS — M353 Polymyalgia rheumatica: Secondary | ICD-10-CM | POA: Diagnosis not present

## 2015-06-26 DIAGNOSIS — S72141D Displaced intertrochanteric fracture of right femur, subsequent encounter for closed fracture with routine healing: Secondary | ICD-10-CM | POA: Diagnosis not present

## 2015-06-26 DIAGNOSIS — M810 Age-related osteoporosis without current pathological fracture: Secondary | ICD-10-CM | POA: Diagnosis not present

## 2015-06-26 DIAGNOSIS — K219 Gastro-esophageal reflux disease without esophagitis: Secondary | ICD-10-CM | POA: Diagnosis not present

## 2015-06-30 DIAGNOSIS — K219 Gastro-esophageal reflux disease without esophagitis: Secondary | ICD-10-CM | POA: Diagnosis not present

## 2015-06-30 DIAGNOSIS — S72141D Displaced intertrochanteric fracture of right femur, subsequent encounter for closed fracture with routine healing: Secondary | ICD-10-CM | POA: Diagnosis not present

## 2015-06-30 DIAGNOSIS — M353 Polymyalgia rheumatica: Secondary | ICD-10-CM | POA: Diagnosis not present

## 2015-06-30 DIAGNOSIS — M810 Age-related osteoporosis without current pathological fracture: Secondary | ICD-10-CM | POA: Diagnosis not present

## 2015-06-30 DIAGNOSIS — F329 Major depressive disorder, single episode, unspecified: Secondary | ICD-10-CM | POA: Diagnosis not present

## 2015-06-30 DIAGNOSIS — K5790 Diverticulosis of intestine, part unspecified, without perforation or abscess without bleeding: Secondary | ICD-10-CM | POA: Diagnosis not present

## 2015-07-02 DIAGNOSIS — M810 Age-related osteoporosis without current pathological fracture: Secondary | ICD-10-CM | POA: Diagnosis not present

## 2015-07-02 DIAGNOSIS — S72141D Displaced intertrochanteric fracture of right femur, subsequent encounter for closed fracture with routine healing: Secondary | ICD-10-CM | POA: Diagnosis not present

## 2015-07-02 DIAGNOSIS — K219 Gastro-esophageal reflux disease without esophagitis: Secondary | ICD-10-CM | POA: Diagnosis not present

## 2015-07-02 DIAGNOSIS — M353 Polymyalgia rheumatica: Secondary | ICD-10-CM | POA: Diagnosis not present

## 2015-07-02 DIAGNOSIS — F329 Major depressive disorder, single episode, unspecified: Secondary | ICD-10-CM | POA: Diagnosis not present

## 2015-07-02 DIAGNOSIS — K5790 Diverticulosis of intestine, part unspecified, without perforation or abscess without bleeding: Secondary | ICD-10-CM | POA: Diagnosis not present

## 2015-07-03 DIAGNOSIS — K219 Gastro-esophageal reflux disease without esophagitis: Secondary | ICD-10-CM | POA: Diagnosis not present

## 2015-07-03 DIAGNOSIS — M810 Age-related osteoporosis without current pathological fracture: Secondary | ICD-10-CM | POA: Diagnosis not present

## 2015-07-03 DIAGNOSIS — K5790 Diverticulosis of intestine, part unspecified, without perforation or abscess without bleeding: Secondary | ICD-10-CM | POA: Diagnosis not present

## 2015-07-03 DIAGNOSIS — S72141D Displaced intertrochanteric fracture of right femur, subsequent encounter for closed fracture with routine healing: Secondary | ICD-10-CM | POA: Diagnosis not present

## 2015-07-03 DIAGNOSIS — M353 Polymyalgia rheumatica: Secondary | ICD-10-CM | POA: Diagnosis not present

## 2015-07-03 DIAGNOSIS — F329 Major depressive disorder, single episode, unspecified: Secondary | ICD-10-CM | POA: Diagnosis not present

## 2015-07-07 DIAGNOSIS — M810 Age-related osteoporosis without current pathological fracture: Secondary | ICD-10-CM | POA: Diagnosis not present

## 2015-07-07 DIAGNOSIS — K5790 Diverticulosis of intestine, part unspecified, without perforation or abscess without bleeding: Secondary | ICD-10-CM | POA: Diagnosis not present

## 2015-07-07 DIAGNOSIS — M353 Polymyalgia rheumatica: Secondary | ICD-10-CM | POA: Diagnosis not present

## 2015-07-07 DIAGNOSIS — S72141D Displaced intertrochanteric fracture of right femur, subsequent encounter for closed fracture with routine healing: Secondary | ICD-10-CM | POA: Diagnosis not present

## 2015-07-07 DIAGNOSIS — F329 Major depressive disorder, single episode, unspecified: Secondary | ICD-10-CM | POA: Diagnosis not present

## 2015-07-07 DIAGNOSIS — K219 Gastro-esophageal reflux disease without esophagitis: Secondary | ICD-10-CM | POA: Diagnosis not present

## 2015-07-08 DIAGNOSIS — F329 Major depressive disorder, single episode, unspecified: Secondary | ICD-10-CM | POA: Diagnosis not present

## 2015-07-08 DIAGNOSIS — K219 Gastro-esophageal reflux disease without esophagitis: Secondary | ICD-10-CM | POA: Diagnosis not present

## 2015-07-08 DIAGNOSIS — M810 Age-related osteoporosis without current pathological fracture: Secondary | ICD-10-CM | POA: Diagnosis not present

## 2015-07-08 DIAGNOSIS — K5790 Diverticulosis of intestine, part unspecified, without perforation or abscess without bleeding: Secondary | ICD-10-CM | POA: Diagnosis not present

## 2015-07-08 DIAGNOSIS — S72141D Displaced intertrochanteric fracture of right femur, subsequent encounter for closed fracture with routine healing: Secondary | ICD-10-CM | POA: Diagnosis not present

## 2015-07-08 DIAGNOSIS — M353 Polymyalgia rheumatica: Secondary | ICD-10-CM | POA: Diagnosis not present

## 2015-07-11 DIAGNOSIS — M353 Polymyalgia rheumatica: Secondary | ICD-10-CM | POA: Diagnosis not present

## 2015-07-11 DIAGNOSIS — S72141D Displaced intertrochanteric fracture of right femur, subsequent encounter for closed fracture with routine healing: Secondary | ICD-10-CM | POA: Diagnosis not present

## 2015-07-11 DIAGNOSIS — K5790 Diverticulosis of intestine, part unspecified, without perforation or abscess without bleeding: Secondary | ICD-10-CM | POA: Diagnosis not present

## 2015-07-11 DIAGNOSIS — F329 Major depressive disorder, single episode, unspecified: Secondary | ICD-10-CM | POA: Diagnosis not present

## 2015-07-11 DIAGNOSIS — K219 Gastro-esophageal reflux disease without esophagitis: Secondary | ICD-10-CM | POA: Diagnosis not present

## 2015-07-11 DIAGNOSIS — M810 Age-related osteoporosis without current pathological fracture: Secondary | ICD-10-CM | POA: Diagnosis not present

## 2015-07-14 DIAGNOSIS — S72141D Displaced intertrochanteric fracture of right femur, subsequent encounter for closed fracture with routine healing: Secondary | ICD-10-CM | POA: Diagnosis not present

## 2015-07-14 DIAGNOSIS — M810 Age-related osteoporosis without current pathological fracture: Secondary | ICD-10-CM | POA: Diagnosis not present

## 2015-07-14 DIAGNOSIS — K5790 Diverticulosis of intestine, part unspecified, without perforation or abscess without bleeding: Secondary | ICD-10-CM | POA: Diagnosis not present

## 2015-07-14 DIAGNOSIS — F329 Major depressive disorder, single episode, unspecified: Secondary | ICD-10-CM | POA: Diagnosis not present

## 2015-07-14 DIAGNOSIS — M353 Polymyalgia rheumatica: Secondary | ICD-10-CM | POA: Diagnosis not present

## 2015-07-14 DIAGNOSIS — K219 Gastro-esophageal reflux disease without esophagitis: Secondary | ICD-10-CM | POA: Diagnosis not present

## 2015-07-15 DIAGNOSIS — S72141D Displaced intertrochanteric fracture of right femur, subsequent encounter for closed fracture with routine healing: Secondary | ICD-10-CM | POA: Diagnosis not present

## 2015-07-16 DIAGNOSIS — F419 Anxiety disorder, unspecified: Secondary | ICD-10-CM | POA: Diagnosis not present

## 2015-07-16 DIAGNOSIS — F329 Major depressive disorder, single episode, unspecified: Secondary | ICD-10-CM | POA: Diagnosis not present

## 2015-07-16 DIAGNOSIS — M810 Age-related osteoporosis without current pathological fracture: Secondary | ICD-10-CM | POA: Diagnosis not present

## 2015-07-16 DIAGNOSIS — I8393 Asymptomatic varicose veins of bilateral lower extremities: Secondary | ICD-10-CM | POA: Diagnosis not present

## 2015-07-18 ENCOUNTER — Other Ambulatory Visit: Payer: Self-pay | Admitting: *Deleted

## 2015-07-18 ENCOUNTER — Encounter: Payer: Self-pay | Admitting: Vascular Surgery

## 2015-07-18 DIAGNOSIS — I83813 Varicose veins of bilateral lower extremities with pain: Secondary | ICD-10-CM

## 2015-08-05 DIAGNOSIS — S72141D Displaced intertrochanteric fracture of right femur, subsequent encounter for closed fracture with routine healing: Secondary | ICD-10-CM | POA: Diagnosis not present

## 2015-08-05 DIAGNOSIS — M25551 Pain in right hip: Secondary | ICD-10-CM | POA: Diagnosis not present

## 2015-08-26 DIAGNOSIS — E7800 Pure hypercholesterolemia, unspecified: Secondary | ICD-10-CM | POA: Diagnosis not present

## 2015-08-26 DIAGNOSIS — K219 Gastro-esophageal reflux disease without esophagitis: Secondary | ICD-10-CM | POA: Diagnosis not present

## 2015-08-26 DIAGNOSIS — M810 Age-related osteoporosis without current pathological fracture: Secondary | ICD-10-CM | POA: Diagnosis not present

## 2015-08-26 DIAGNOSIS — F419 Anxiety disorder, unspecified: Secondary | ICD-10-CM | POA: Diagnosis not present

## 2015-09-09 ENCOUNTER — Encounter: Payer: PRIVATE HEALTH INSURANCE | Admitting: Vascular Surgery

## 2015-09-09 ENCOUNTER — Encounter (HOSPITAL_COMMUNITY): Payer: PRIVATE HEALTH INSURANCE

## 2015-09-15 ENCOUNTER — Ambulatory Visit (INDEPENDENT_AMBULATORY_CARE_PROVIDER_SITE_OTHER): Payer: Medicare Other | Admitting: Gastroenterology

## 2015-09-15 ENCOUNTER — Encounter: Payer: Self-pay | Admitting: Gastroenterology

## 2015-09-15 VITALS — BP 120/70 | HR 84 | Ht 59.0 in | Wt 99.0 lb

## 2015-09-15 DIAGNOSIS — K219 Gastro-esophageal reflux disease without esophagitis: Secondary | ICD-10-CM

## 2015-09-15 MED ORDER — ESOMEPRAZOLE MAGNESIUM 40 MG PO CPDR: 40.0000 mg | DELAYED_RELEASE_CAPSULE | Freq: Every day | ORAL | Status: DC

## 2015-09-15 NOTE — Patient Instructions (Signed)
We have sent the following medications to your pharmacy for you to pick up at your convenience: Nexium 40 mg daily.   Patient advised to avoid spicy, acidic, citrus, chocolate, mints, fruit and fruit juices.  Limit the intake of caffeine, alcohol and Soda.  Don't exercise too soon after eating.  Don't lie down within 3-4 hours of eating.  Elevate the head of your bed.  Normal BMI (Body Mass Index- based on height and weight) is between 23 and 30. Your BMI today is Body mass index is 19.98 kg/(m^2). Marland Kitchen Please consider follow up  regarding your BMI with your Primary Care Provider.  Thank you for choosing me and [facility].  Pricilla Riffle. Dagoberto Ligas., MD., Marval Regal

## 2015-09-15 NOTE — Progress Notes (Signed)
    History of Present Illness: This is an 82 year old female with GERD and belching. Biopsies from an upper endoscopy in 1999 by Dr. Lajoyce Corners revealed a rare focus of Barrett's. Subsequent upper endoscopies in 2001 at 2003 by Dr. Lajoyce Corners did not show Barrett's. Her most recent colonoscopy was performed in February 2010 by Dr. Lajoyce Corners for followup of colon polyps showing diverticulosis. Recently she has had more frequent reflux symptoms. She is not closely following antireflux measures. Denies weight loss, abdominal pain, constipation, diarrhea, change in stool caliber, melena, hematochezia, nausea, vomiting, dysphagia,chest pain.   Current Medications, Allergies, Past Medical History, Past Surgical History, Family History and Social History were reviewed in [employer] record.  Physical Exam: General: Well developed, well nourished, no acute distress Head: Normocephalic and atraumatic Eyes:  sclerae anicteric, EOMI Ears: Normal auditory acuity Mouth: No deformity or lesions Lungs: Clear throughout to auscultation Heart: Regular rate and rhythm; no murmurs, rubs or bruits Abdomen: Soft, non tender and non distended. No masses, hepatosplenomegaly or hernias noted. Normal Bowel sounds Musculoskeletal: Symmetrical with no gross deformities  Pulses:  Normal pulses noted Extremities: No clubbing, cyanosis, edema or deformities noted Neurological: Alert oriented x 4, grossly nonfocal Psychological:  Alert and cooperative. Normal mood and affect  Assessment and Recommendations:  1. GERD, suboptimal control. Closely follow antireflux measures and increase Nexium to 40 mg po qam. Gas-X qid prn gas, belching. Call back if symptoms not controlled. Follow up with PCP.

## 2015-09-22 DIAGNOSIS — S72141D Displaced intertrochanteric fracture of right femur, subsequent encounter for closed fracture with routine healing: Secondary | ICD-10-CM | POA: Diagnosis not present

## 2015-09-22 DIAGNOSIS — M25551 Pain in right hip: Secondary | ICD-10-CM | POA: Diagnosis not present

## 2015-09-30 DIAGNOSIS — S72141D Displaced intertrochanteric fracture of right femur, subsequent encounter for closed fracture with routine healing: Secondary | ICD-10-CM | POA: Diagnosis not present

## 2015-09-30 DIAGNOSIS — M25551 Pain in right hip: Secondary | ICD-10-CM | POA: Diagnosis not present

## 2015-10-02 DIAGNOSIS — F419 Anxiety disorder, unspecified: Secondary | ICD-10-CM | POA: Diagnosis not present

## 2015-10-02 DIAGNOSIS — E7800 Pure hypercholesterolemia, unspecified: Secondary | ICD-10-CM | POA: Diagnosis not present

## 2015-10-02 DIAGNOSIS — Z0000 Encounter for general adult medical examination without abnormal findings: Secondary | ICD-10-CM | POA: Diagnosis not present

## 2015-10-03 DIAGNOSIS — M25551 Pain in right hip: Secondary | ICD-10-CM | POA: Diagnosis not present

## 2015-10-03 DIAGNOSIS — S72141D Displaced intertrochanteric fracture of right femur, subsequent encounter for closed fracture with routine healing: Secondary | ICD-10-CM | POA: Diagnosis not present

## 2015-10-09 DIAGNOSIS — E7800 Pure hypercholesterolemia, unspecified: Secondary | ICD-10-CM | POA: Diagnosis not present

## 2015-10-09 DIAGNOSIS — M810 Age-related osteoporosis without current pathological fracture: Secondary | ICD-10-CM | POA: Diagnosis not present

## 2015-10-09 DIAGNOSIS — L219 Seborrheic dermatitis, unspecified: Secondary | ICD-10-CM | POA: Diagnosis not present

## 2015-10-28 DIAGNOSIS — M25551 Pain in right hip: Secondary | ICD-10-CM | POA: Diagnosis not present

## 2015-10-28 DIAGNOSIS — S72141D Displaced intertrochanteric fracture of right femur, subsequent encounter for closed fracture with routine healing: Secondary | ICD-10-CM | POA: Diagnosis not present

## 2015-11-03 DIAGNOSIS — H401131 Primary open-angle glaucoma, bilateral, mild stage: Secondary | ICD-10-CM | POA: Diagnosis not present

## 2016-02-09 DIAGNOSIS — Z23 Encounter for immunization: Secondary | ICD-10-CM | POA: Diagnosis not present

## 2016-02-09 DIAGNOSIS — G4700 Insomnia, unspecified: Secondary | ICD-10-CM | POA: Diagnosis not present

## 2016-02-09 DIAGNOSIS — M810 Age-related osteoporosis without current pathological fracture: Secondary | ICD-10-CM | POA: Diagnosis not present

## 2016-02-09 DIAGNOSIS — F419 Anxiety disorder, unspecified: Secondary | ICD-10-CM | POA: Diagnosis not present

## 2016-02-09 DIAGNOSIS — E7800 Pure hypercholesterolemia, unspecified: Secondary | ICD-10-CM | POA: Diagnosis not present

## 2016-03-18 DIAGNOSIS — Z85828 Personal history of other malignant neoplasm of skin: Secondary | ICD-10-CM | POA: Diagnosis not present

## 2016-03-18 DIAGNOSIS — L738 Other specified follicular disorders: Secondary | ICD-10-CM | POA: Diagnosis not present

## 2016-03-18 DIAGNOSIS — L720 Epidermal cyst: Secondary | ICD-10-CM | POA: Diagnosis not present

## 2016-03-18 DIAGNOSIS — Z419 Encounter for procedure for purposes other than remedying health state, unspecified: Secondary | ICD-10-CM | POA: Diagnosis not present

## 2016-06-10 DIAGNOSIS — I959 Hypotension, unspecified: Secondary | ICD-10-CM | POA: Diagnosis not present

## 2016-06-10 DIAGNOSIS — M810 Age-related osteoporosis without current pathological fracture: Secondary | ICD-10-CM | POA: Diagnosis not present

## 2016-06-10 DIAGNOSIS — E559 Vitamin D deficiency, unspecified: Secondary | ICD-10-CM | POA: Diagnosis not present

## 2016-06-10 DIAGNOSIS — E7800 Pure hypercholesterolemia, unspecified: Secondary | ICD-10-CM | POA: Diagnosis not present

## 2016-06-17 DIAGNOSIS — F329 Major depressive disorder, single episode, unspecified: Secondary | ICD-10-CM | POA: Diagnosis not present

## 2016-06-17 DIAGNOSIS — F419 Anxiety disorder, unspecified: Secondary | ICD-10-CM | POA: Diagnosis not present

## 2016-06-17 DIAGNOSIS — Z0000 Encounter for general adult medical examination without abnormal findings: Secondary | ICD-10-CM | POA: Diagnosis not present

## 2016-07-14 DIAGNOSIS — F419 Anxiety disorder, unspecified: Secondary | ICD-10-CM | POA: Diagnosis not present

## 2016-07-14 DIAGNOSIS — E039 Hypothyroidism, unspecified: Secondary | ICD-10-CM | POA: Diagnosis not present

## 2016-07-14 DIAGNOSIS — M549 Dorsalgia, unspecified: Secondary | ICD-10-CM | POA: Diagnosis not present

## 2016-07-14 DIAGNOSIS — Z5181 Encounter for therapeutic drug level monitoring: Secondary | ICD-10-CM | POA: Diagnosis not present

## 2016-07-23 ENCOUNTER — Encounter: Payer: Self-pay | Admitting: Gastroenterology

## 2016-07-23 ENCOUNTER — Encounter (INDEPENDENT_AMBULATORY_CARE_PROVIDER_SITE_OTHER): Payer: Self-pay

## 2016-07-23 ENCOUNTER — Ambulatory Visit (INDEPENDENT_AMBULATORY_CARE_PROVIDER_SITE_OTHER): Payer: Medicare Other | Admitting: Gastroenterology

## 2016-07-23 VITALS — BP 140/64 | HR 80 | Ht 59.0 in | Wt 96.4 lb

## 2016-07-23 DIAGNOSIS — R140 Abdominal distension (gaseous): Secondary | ICD-10-CM

## 2016-07-23 DIAGNOSIS — K589 Irritable bowel syndrome without diarrhea: Secondary | ICD-10-CM | POA: Diagnosis not present

## 2016-07-23 DIAGNOSIS — K219 Gastro-esophageal reflux disease without esophagitis: Secondary | ICD-10-CM

## 2016-07-23 MED ORDER — GLYCOPYRROLATE 1 MG PO TABS: 1.0000 mg | ORAL_TABLET | Freq: Two times a day (BID) | ORAL | 11 refills | Status: DC

## 2016-07-23 MED ORDER — ESOMEPRAZOLE MAGNESIUM 40 MG PO CPDR: 40.0000 mg | DELAYED_RELEASE_CAPSULE | Freq: Every day | ORAL | 11 refills | Status: DC

## 2016-07-23 NOTE — Patient Instructions (Signed)
We have sent the following medications to your pharmacy for you to pick up at your convenience: Nexium and Robinul.  You have been given a low fat diet.   You Gas-X four times a day for gas and bloating.   Stop all milk products.   Call our office back if your symptoms have not improved.   Thank you for choosing me and [facility].  Pricilla Riffle. Dagoberto Ligas., MD., Mar[facility] Regal

## 2016-07-23 NOTE — Progress Notes (Signed)
    History of Present Illness: This is an 83 year old female with GERD, gas, bloating, belching, abdominal pain. She recently has decreased her intake of milk products and most symptoms have improved. Gas-X has also improved her symptoms. She is no longer taking glycopyrrolate. She previously had problems with constipation but she states her bowel movements have generally been normal.  Current Medications, Allergies, Past Medical History, Past Surgical History, Family History and Social History were reviewed in [employer] record.  Physical Exam: General: Well developed, well nourished, no acute distress Head: Normocephalic and atraumatic Eyes:  sclerae anicteric, EOMI Ears: Normal auditory acuity Mouth: No deformity or lesions Lungs: Clear throughout to auscultation Heart: Regular rate and rhythm; no murmurs, rubs or bruits Abdomen: Soft, non tender and non distended. No masses, hepatosplenomegaly or hernias noted. Normal Bowel sounds Musculoskeletal: Symmetrical with no gross deformities  Pulses:  Normal pulses noted Extremities: No clubbing, cyanosis, edema or deformities noted Neurological: Alert oriented x 4, grossly nonfocal Psychological:  Alert and cooperative. Normal mood and affect  Assessment and Recommendations:  1. GERD. Closely follow all antireflux measures. Continue Nexium 40 mg by mouth every morning.  2. IBS with abdominal pain, abdominal bloating and increased gas. Lactose avoidance diet for one week and continue his symptoms improved. Low gas diet. Gas-X 4 times a day when necessary. Resume glycopyrrolate 1 mg twice daily as needed. If symptoms not substantially improved with these measures the patient is advised to call back within one week for further evaluation.

## 2016-07-27 ENCOUNTER — Ambulatory Visit: Payer: Medicare Other | Admitting: Physician Assistant

## 2016-08-02 ENCOUNTER — Telehealth: Payer: Self-pay | Admitting: Gastroenterology

## 2016-08-02 NOTE — Telephone Encounter (Signed)
DC Robinul, very likely having side effects. IBgard 2 po tid for 1 week then tid prn.

## 2016-08-02 NOTE — Telephone Encounter (Signed)
Patient states for the last 2 days she has noticed that after taking Robinul, she will have dizziness and abnormal gait. Patient states it's like she has vertigo. Patient states she looked up the side effects and the main one is dizziness. Informed patient to stop taking this medication and I can send this message to Dr. Fuller Plan and see if he wants to put her on another medication in it's place. Please advise Dr. Fuller Plan.

## 2016-08-03 NOTE — Telephone Encounter (Signed)
Informed patient to pick up IBgard at her pharmacy and how to take the medication. Patient verbalized understanding.

## 2016-08-17 DIAGNOSIS — M25551 Pain in right hip: Secondary | ICD-10-CM | POA: Diagnosis not present

## 2016-08-23 DIAGNOSIS — M25551 Pain in right hip: Secondary | ICD-10-CM | POA: Diagnosis not present

## 2016-09-21 DIAGNOSIS — S299XXA Unspecified injury of thorax, initial encounter: Secondary | ICD-10-CM | POA: Diagnosis not present

## 2016-09-21 DIAGNOSIS — R0781 Pleurodynia: Secondary | ICD-10-CM | POA: Diagnosis not present

## 2016-09-21 DIAGNOSIS — W19XXXA Unspecified fall, initial encounter: Secondary | ICD-10-CM | POA: Diagnosis not present

## 2016-10-11 DIAGNOSIS — F419 Anxiety disorder, unspecified: Secondary | ICD-10-CM | POA: Diagnosis not present

## 2016-10-11 DIAGNOSIS — Z5181 Encounter for therapeutic drug level monitoring: Secondary | ICD-10-CM | POA: Diagnosis not present

## 2016-10-11 DIAGNOSIS — E039 Hypothyroidism, unspecified: Secondary | ICD-10-CM | POA: Diagnosis not present

## 2016-10-14 DIAGNOSIS — G8929 Other chronic pain: Secondary | ICD-10-CM | POA: Diagnosis not present

## 2016-10-14 DIAGNOSIS — E039 Hypothyroidism, unspecified: Secondary | ICD-10-CM | POA: Diagnosis not present

## 2016-10-14 DIAGNOSIS — M545 Low back pain: Secondary | ICD-10-CM | POA: Diagnosis not present

## 2016-10-14 DIAGNOSIS — F419 Anxiety disorder, unspecified: Secondary | ICD-10-CM | POA: Diagnosis not present

## 2016-11-25 ENCOUNTER — Emergency Department (HOSPITAL_COMMUNITY): Payer: Medicare Other

## 2016-11-25 ENCOUNTER — Encounter (HOSPITAL_COMMUNITY): Payer: Self-pay | Admitting: Neurology

## 2016-11-25 ENCOUNTER — Observation Stay (HOSPITAL_COMMUNITY)
Admission: EM | Admit: 2016-11-25 | Discharge: 2016-11-26 | Disposition: A | Discharge status: Home / Self Care | Payer: Medicare Other | Attending: Family Medicine | Admitting: Family Medicine

## 2016-11-25 DIAGNOSIS — H409 Unspecified glaucoma: Secondary | ICD-10-CM | POA: Insufficient documentation

## 2016-11-25 DIAGNOSIS — K219 Gastro-esophageal reflux disease without esophagitis: Secondary | ICD-10-CM | POA: Diagnosis not present

## 2016-11-25 DIAGNOSIS — Z87891 Personal history of nicotine dependence: Secondary | ICD-10-CM | POA: Diagnosis not present

## 2016-11-25 DIAGNOSIS — R296 Repeated falls: Secondary | ICD-10-CM | POA: Diagnosis not present

## 2016-11-25 DIAGNOSIS — D638 Anemia in other chronic diseases classified elsewhere: Secondary | ICD-10-CM | POA: Diagnosis not present

## 2016-11-25 DIAGNOSIS — Z79899 Other long term (current) drug therapy: Secondary | ICD-10-CM | POA: Insufficient documentation

## 2016-11-25 DIAGNOSIS — M353 Polymyalgia rheumatica: Secondary | ICD-10-CM | POA: Diagnosis not present

## 2016-11-25 DIAGNOSIS — D649 Anemia, unspecified: Secondary | ICD-10-CM

## 2016-11-25 DIAGNOSIS — Z8719 Personal history of other diseases of the digestive system: Secondary | ICD-10-CM | POA: Diagnosis not present

## 2016-11-25 DIAGNOSIS — E871 Hypo-osmolality and hyponatremia: Secondary | ICD-10-CM

## 2016-11-25 DIAGNOSIS — Z882 Allergy status to sulfonamides status: Secondary | ICD-10-CM | POA: Diagnosis not present

## 2016-11-25 DIAGNOSIS — Z880 Allergy status to penicillin: Secondary | ICD-10-CM | POA: Diagnosis not present

## 2016-11-25 DIAGNOSIS — M8088XD Other osteoporosis with current pathological fracture, vertebra(e), subsequent encounter for fracture with routine healing: Secondary | ICD-10-CM | POA: Insufficient documentation

## 2016-11-25 DIAGNOSIS — Z888 Allergy status to other drugs, medicaments and biological substances status: Secondary | ICD-10-CM | POA: Insufficient documentation

## 2016-11-25 DIAGNOSIS — Z881 Allergy status to other antibiotic agents status: Secondary | ICD-10-CM | POA: Insufficient documentation

## 2016-11-25 DIAGNOSIS — K2270 Barrett's esophagus without dysplasia: Secondary | ICD-10-CM | POA: Diagnosis not present

## 2016-11-25 DIAGNOSIS — F329 Major depressive disorder, single episode, unspecified: Secondary | ICD-10-CM | POA: Insufficient documentation

## 2016-11-25 DIAGNOSIS — J189 Pneumonia, unspecified organism: Secondary | ICD-10-CM | POA: Diagnosis not present

## 2016-11-25 DIAGNOSIS — R05 Cough: Secondary | ICD-10-CM | POA: Diagnosis not present

## 2016-11-25 DIAGNOSIS — F0390 Unspecified dementia without behavioral disturbance: Secondary | ICD-10-CM | POA: Insufficient documentation

## 2016-11-25 DIAGNOSIS — R0602 Shortness of breath: Secondary | ICD-10-CM | POA: Diagnosis not present

## 2016-11-25 DIAGNOSIS — J181 Lobar pneumonia, unspecified organism: Secondary | ICD-10-CM

## 2016-11-25 DIAGNOSIS — F419 Anxiety disorder, unspecified: Secondary | ICD-10-CM | POA: Insufficient documentation

## 2016-11-25 DIAGNOSIS — W19XXXS Unspecified fall, sequela: Secondary | ICD-10-CM | POA: Diagnosis not present

## 2016-11-25 DIAGNOSIS — W19XXXA Unspecified fall, initial encounter: Secondary | ICD-10-CM | POA: Diagnosis present

## 2016-11-25 HISTORY — DX: Personal history of peptic ulcer disease: Z87.11

## 2016-11-25 HISTORY — DX: Pneumonia, unspecified organism: J18.9

## 2016-11-25 HISTORY — DX: Unspecified osteoarthritis, unspecified site: M19.90

## 2016-11-25 HISTORY — DX: Low back pain: M54.5

## 2016-11-25 HISTORY — DX: Other chronic pain: G89.29

## 2016-11-25 HISTORY — DX: Personal history of other diseases of the digestive system: Z87.19

## 2016-11-25 HISTORY — DX: Low back pain, unspecified: M54.50

## 2016-11-25 LAB — URINALYSIS, ROUTINE W REFLEX MICROSCOPIC
BACTERIA UA: NONE SEEN
Bilirubin Urine: NEGATIVE
GLUCOSE, UA: NEGATIVE mg/dL
KETONES UR: NEGATIVE mg/dL
LEUKOCYTES UA: NEGATIVE
NITRITE: NEGATIVE
PH: 6 (ref 5.0–8.0)
PROTEIN: 30 mg/dL — AB
Specific Gravity, Urine: 1.013 (ref 1.005–1.030)

## 2016-11-25 LAB — BASIC METABOLIC PANEL
ANION GAP: 10 (ref 5–15)
BUN: 14 mg/dL (ref 6–20)
CHLORIDE: 96 mmol/L — AB (ref 101–111)
CO2: 23 mmol/L (ref 22–32)
Calcium: 9.2 mg/dL (ref 8.9–10.3)
Creatinine, Ser: 0.92 mg/dL (ref 0.44–1.00)
GFR calc Af Amer: >60 mL/min (ref >60)
GFR, EST NON AFRICAN AMERICAN: 53 mL/min — AB (ref >60)
GLUCOSE: 111 mg/dL — AB (ref 65–99)
POTASSIUM: 3.4 mmol/L — AB (ref 3.5–5.1)
Sodium: 129 mmol/L — ABNORMAL LOW (ref 135–145)

## 2016-11-25 LAB — CBC
HEMATOCRIT: 29.5 % — AB (ref 36.0–46.0)
HEMOGLOBIN: 9.5 g/dL — AB (ref 12.0–15.0)
MCH: 26.8 pg (ref 26.0–34.0)
MCHC: 32.2 g/dL (ref 30.0–36.0)
MCV: 83.1 fL (ref 78.0–100.0)
Platelets: 316 10*3/uL (ref 150–400)
RBC: 3.55 MIL/uL — ABNORMAL LOW (ref 3.87–5.11)
RDW: 15.5 % (ref 11.5–15.5)
WBC: 9.7 10*3/uL (ref 4.0–10.5)

## 2016-11-25 LAB — STREP PNEUMONIAE URINARY ANTIGEN: Strep Pneumo Urinary Antigen: NEGATIVE

## 2016-11-25 LAB — I-STAT CG4 LACTIC ACID, ED: LACTIC ACID, VENOUS: 0.74 mmol/L (ref 0.5–1.9)

## 2016-11-25 LAB — I-STAT TROPONIN, ED: Troponin i, poc: 0.02 ng/mL (ref 0.00–0.08)

## 2016-11-25 MED ORDER — DEXTROSE 5 % IV SOLN
500.0000 mg | Freq: Once | INTRAVENOUS | Status: DC
  Filled 2016-11-25: qty 500

## 2016-11-25 MED ORDER — ARIPIPRAZOLE 2 MG PO TABS
2.0000 mg | ORAL_TABLET | Freq: Every day | ORAL | Status: DC
  Administered 2016-11-25 – 2016-11-26 (×2): 2 mg via ORAL
  Filled 2016-11-25 (×2): qty 1

## 2016-11-25 MED ORDER — DEXTROSE 5 % IV SOLN
1.0000 g | INTRAVENOUS | Status: DC
  Filled 2016-11-25: qty 10

## 2016-11-25 MED ORDER — ALIGN 4 MG PO CAPS: 4.0000 mg | ORAL_CAPSULE | Freq: Every day | ORAL | Status: DC

## 2016-11-25 MED ORDER — LORATADINE 10 MG PO TABS
10.0000 mg | ORAL_TABLET | Freq: Every day | ORAL | Status: DC
  Administered 2016-11-26: 10 mg via ORAL
  Filled 2016-11-25: qty 1

## 2016-11-25 MED ORDER — CITALOPRAM HYDROBROMIDE 20 MG PO TABS
40.0000 mg | ORAL_TABLET | Freq: Every day | ORAL | Status: DC
  Administered 2016-11-26: 40 mg via ORAL
  Filled 2016-11-25: qty 2

## 2016-11-25 MED ORDER — ADULT MULTIVITAMIN W/MINERALS CH
1.0000 | ORAL_TABLET | Freq: Every day | ORAL | Status: DC
  Administered 2016-11-26: 1 via ORAL
  Filled 2016-11-25: qty 1

## 2016-11-25 MED ORDER — DORZOLAMIDE HCL 2 % OP SOLN
1.0000 [drp] | Freq: Two times a day (BID) | OPHTHALMIC | Status: DC
  Administered 2016-11-25 – 2016-11-26 (×2): 1 [drp] via OPHTHALMIC
  Filled 2016-11-25: qty 10

## 2016-11-25 MED ORDER — SENNOSIDES-DOCUSATE SODIUM 8.6-50 MG PO TABS: 1.0000 | ORAL_TABLET | Freq: Every evening | ORAL | Status: DC | PRN

## 2016-11-25 MED ORDER — DEXTROSE 5 % IV SOLN
500.0000 mg | INTRAVENOUS | Status: DC
  Filled 2016-11-25: qty 500

## 2016-11-25 MED ORDER — TRAZODONE HCL 50 MG PO TABS
25.0000 mg | ORAL_TABLET | Freq: Every evening | ORAL | Status: DC | PRN
  Administered 2016-11-25: 25 mg via ORAL
  Filled 2016-11-25: qty 1

## 2016-11-25 MED ORDER — FLUTICASONE PROPIONATE 50 MCG/ACT NA SUSP
1.0000 | Freq: Every day | NASAL | Status: DC
  Administered 2016-11-26: 1 via NASAL
  Filled 2016-11-25: qty 16

## 2016-11-25 MED ORDER — LATANOPROST 0.005 % OP SOLN
1.0000 [drp] | Freq: Every day | OPHTHALMIC | Status: DC
  Administered 2016-11-25: 1 [drp] via OPHTHALMIC
  Filled 2016-11-25: qty 2.5

## 2016-11-25 MED ORDER — ONDANSETRON HCL 4 MG/2ML IJ SOLN
4.0000 mg | Freq: Once | INTRAMUSCULAR | Status: AC
  Administered 2016-11-25: 4 mg via INTRAVENOUS
  Filled 2016-11-25: qty 2

## 2016-11-25 MED ORDER — ALPRAZOLAM 0.25 MG PO TABS
0.2500 mg | ORAL_TABLET | Freq: Three times a day (TID) | ORAL | Status: DC | PRN
Start: 2016-11-25 — End: 2016-11-26
  Administered 2016-11-25 – 2016-11-26 (×2): 0.25 mg via ORAL
  Filled 2016-11-25 (×2): qty 1

## 2016-11-25 MED ORDER — POTASSIUM CHLORIDE CRYS ER 20 MEQ PO TBCR
40.0000 meq | EXTENDED_RELEASE_TABLET | Freq: Once | ORAL | Status: AC
  Administered 2016-11-25: 40 meq via ORAL
  Filled 2016-11-25: qty 2

## 2016-11-25 MED ORDER — RISAQUAD PO CAPS
1.0000 | ORAL_CAPSULE | Freq: Every day | ORAL | Status: DC
  Administered 2016-11-26: 1 via ORAL
  Filled 2016-11-25: qty 1

## 2016-11-25 MED ORDER — DEXTROSE 5 % IV SOLN
1.0000 g | Freq: Once | INTRAVENOUS | Status: AC
  Administered 2016-11-25: 1 g via INTRAVENOUS
  Filled 2016-11-25: qty 10

## 2016-11-25 MED ORDER — TRAMADOL HCL 50 MG PO TABS
50.0000 mg | ORAL_TABLET | Freq: Two times a day (BID) | ORAL | Status: DC | PRN
  Administered 2016-11-25: 50 mg via ORAL
  Filled 2016-11-25: qty 1

## 2016-11-25 MED ORDER — ACETAMINOPHEN 650 MG RE SUPP: 650.0000 mg | Freq: Four times a day (QID) | RECTAL | Status: DC | PRN

## 2016-11-25 MED ORDER — AZELASTINE HCL 0.1 % NA SOLN
1.0000 | Freq: Every day | NASAL | Status: DC
  Administered 2016-11-26: 1 via NASAL
  Filled 2016-11-25: qty 30

## 2016-11-25 MED ORDER — ACETAMINOPHEN 325 MG PO TABS
650.0000 mg | ORAL_TABLET | Freq: Four times a day (QID) | ORAL | Status: DC | PRN
  Administered 2016-11-25: 650 mg via ORAL
  Filled 2016-11-25: qty 2

## 2016-11-25 MED ORDER — PANTOPRAZOLE SODIUM 40 MG PO TBEC
40.0000 mg | DELAYED_RELEASE_TABLET | Freq: Every day | ORAL | Status: DC
  Administered 2016-11-26: 40 mg via ORAL
  Filled 2016-11-25: qty 1

## 2016-11-25 MED ORDER — SODIUM CHLORIDE 0.9 % IV SOLN
INTRAVENOUS | Status: DC
  Administered 2016-11-25: 20:00:00 via INTRAVENOUS

## 2016-11-25 MED ORDER — VITAMIN D 1000 UNITS PO TABS
1000.0000 [IU] | ORAL_TABLET | Freq: Every day | ORAL | Status: DC
  Administered 2016-11-26: 1000 [IU] via ORAL
  Filled 2016-11-25: qty 1

## 2016-11-25 NOTE — ED Notes (Signed)
Patient transported to X-ray 

## 2016-11-25 NOTE — ED Provider Notes (Signed)
I saw and evaluated the patient, reviewed the resident's note and I agree with the findings and plan.   EKG Interpretation  Date/Time:  Thursday November 25 2016 14:45:33 EDT Ventricular Rate:  94 PR Interval:    QRS Duration: 82 QT Interval:  353 QTC Calculation: 442 R Axis:   -14 Text Interpretation:  Sinus rhythm Atrial premature complex LVH by voltage Anterior Q waves, possibly due to LVH no acute ischemic changes  Confirmed by Brantley Stage [id number]) on 11/25/2016 3:18:41 PM      83 year old female with history of GERD who presents with 2 weeks of cough, green sputum, shortness of breath and fatigue. Her doctor's office called the ambulance today to bring her into the ED for evaluation. She is afebrile, near tachycardic, but normotensive and in no respiratory distress. Crackles at the right lung base. Chest x-ray visualized. It is suggestive of right lower lobe bronchopneumonia. We'll treat for community-acquired pneumonia. Admit for observation.    Forde Dandy, MD 11/25/16 [phone]

## 2016-11-25 NOTE — ED Notes (Signed)
Attempted report to 6N 

## 2016-11-25 NOTE — ED Provider Notes (Signed)
[facility] DEPT Provider Note   CSN: [id number] Arrival date & time: 11/25/16  1440     History   Chief Complaint Chief Complaint  Patient presents with  . Shortness of Breath  . Chest Pain    HPI BRITLEY GASHI is a 83 y.o. female with history of GERD who is coming in today for shortness of breath cough, and chest pain. Poor historian. Patient states it has been going on for over a week now. Has not tried anything for it. States her chest only hurts when she coughs and is in the substernal area that is nonradiating. Describes it as sharp. Says at that time she does get short of breath. No diaphoresis, nausea, vomiting. Her cough is productive of clear sputum. She denies any abdominal pain. No urinary symptoms.  HPI  Past Medical History:  Diagnosis Date  . Allergic rhinitis   . Anxiety   . Arthritis    "hands" (11/25/2016)  . Barrett's esophagus 06/1997  . CAP (community acquired pneumonia) 11/25/2016   "this is my 3rd time having pneumonia" (11/25/2016)  . Chondromalacia   . Chronic lower back pain   . Depression   . Diverticulosis   . GERD (gastroesophageal reflux disease)   . Glaucoma   . History of hiatal hernia   . History of stomach ulcers   . Lumbar compression fracture (HCC)    L1 and L2  . Osteoporosis   . Pneumonia   . Polymyalgia ([place])   . Tubular adenoma of colon 06/1999  . Vaginitis   . Varicose veins     Patient Active Problem List   Diagnosis Date Noted  . CAP (community acquired pneumonia) 11/25/2016  . Anemia 11/25/2016  . Hyponatremia 11/25/2016  . Fracture, intertrochanteric, right femur ([place]) 05/26/2015  . Closed fracture of femur ([place])   . Post-operative pain   . Acute blood loss anemia   . Adjustment disorder with mixed anxiety and depressed mood   . Falls   . Abnormality of gait   . Femur fracture, right, closed, initial encounter 05/23/2015  . Fracture of femoral neck, right, closed ([place]) 05/22/2015  . Femoral neck fracture, right,  closed, initial encounter 05/22/2015  . Syncope 05/30/2012  . Nausea & vomiting 05/30/2012  . Chest pain 05/30/2012  . Hypotension 05/30/2012  . Esophageal reflux 12/16/2011  . Lumbar compression fracture ([place])   . Polymyalgia ([place])     Past Surgical History:  Procedure Laterality Date  . BACK SURGERY    . CATARACT EXTRACTION W/ INTRAOCULAR LENS  IMPLANT, BILATERAL Bilateral   . FIXATION KYPHOPLASTY LUMBAR SPINE  10/12/10; 09/20/11  . FRACTURE SURGERY    . LUMBAR SPINE SURGERY     "did OR on 2 fractures in my lower back"  . LUNG BIOPSY    . ORIF HIP FRACTURE Right 05/23/2015   Procedure: OPEN REDUCTION INTERNAL FIXATION HIP;  Surgeon: Frederik Pear, MD;  Location: [facility];  Service: Orthopedics;  Laterality: Right;  Marland Kitchen VARICOSE VEIN SURGERY     vein ligation and stripping "years ago"    OB History    Gravida Para Term Preterm AB Living   4 4 4     4    SAB TAB Ectopic Multiple Live Births                   Home Medications    Prior to Admission medications   Medication Sig Start Date End Date Taking? Authorizing Provider  ALPRAZolam Duanne Moron) 0.25 MG  tablet Take 1 tablet (0.25 mg total) by mouth 3 (three) times daily as needed for anxiety. 06/04/15  Yes Angiulli, Lavon Paganini, PA-C  ARIPiprazole (ABILIFY) 2 MG tablet Take 2 mg by mouth daily. 11/12/16  Yes [provider]  azelastine (ASTELIN) 137 MCG/SPRAY nasal spray Place 1 spray into the nose daily. Reported on 09/15/2015   Yes [provider]  cetirizine (ZYRTEC) 10 MG tablet Take 10 mg by mouth daily.   Yes [provider]  cholecalciferol (VITAMIN D) 1000 UNITS tablet Take 1,000 Units by mouth daily.   Yes [provider]  citalopram (CELEXA) 40 MG tablet Take 40 mg by mouth daily. 09/12/16  Yes [provider]  cyclobenzaprine (FLEXERIL) 10 MG tablet Take 10 mg by mouth 3 (three) times daily as needed for muscle spasms.    Yes [provider]  dorzolamide (TRUSOPT) 2 % ophthalmic  solution Place 1 drop into both eyes 2 (two) times daily.    Yes [provider]  esomeprazole (NEXIUM) 40 MG capsule Take 1 capsule (40 mg total) by mouth daily. 07/23/16  Yes Ladene Artist, MD  fluticasone (FLONASE) 50 MCG/ACT nasal spray Place 1 spray into both nostrils daily.    Yes [provider]  glycopyrrolate (ROBINUL) 1 MG tablet Take 1 tablet (1 mg total) by mouth 2 (two) times daily. 07/23/16  Yes Ladene Artist, MD  HYDROcodone-acetaminophen (NORCO) 5-325 MG tablet Take 1 tablet by mouth every 6 (six) hours as needed for moderate pain. 06/04/15  Yes Angiulli, Lavon Paganini, PA-C  ibuprofen (ADVIL,MOTRIN) 200 MG tablet Take 200 mg by mouth every 6 (six) hours as needed for mild pain.   Yes [provider]  latanoprost (XALATAN) 0.005 % ophthalmic solution Place 1 drop into both eyes at bedtime.   Yes [provider]  LORazepam (ATIVAN) 1 MG tablet Take 1 mg by mouth 2 (two) times daily as needed for anxiety.   Yes [provider]  Multiple Vitamin (MULTIVITAMIN) tablet Take 1 tablet by mouth daily.   Yes [provider]  Probiotic Product (ALIGN) 4 MG CAPS Take 4 mg by mouth daily.   Yes [provider]  traMADol (ULTRAM) 50 MG tablet Take 1 tablet (50 mg total) by mouth every 6 (six) hours as needed for moderate pain. 06/04/15  Yes Angiulli, Lavon Paganini, PA-C  traZODone (DESYREL) 50 MG tablet Take 0.5 tablets (25 mg total) by mouth at bedtime. Patient taking differently: Take 25 mg by mouth at bedtime as needed and may repeat dose one time if needed for sleep.  06/04/15  Yes Angiulli, Lavon Paganini, PA-C    Family History Family History  Problem Relation Age of Onset  . Colon cancer Mother 78  . Congestive Heart Failure Mother   . Heart disease Father   . Alcohol abuse Father   . Heart disease Maternal Grandmother   . Fibromyalgia Son     Social History Social History  Substance Use Topics  . Smoking status: Former Smoker     Years: 1.00  . Smokeless tobacco: Never Used     Comment: At age 19 yrs old but nothing since   . Alcohol use No     Allergies   Avelox [moxifloxacin hcl in nacl]; Biaxin [clarithromycin]; Cefuroxime; Levaquin [levofloxacin in d5w]; Minocycline; Paxil [paroxetine hcl]; Prednisone; Remeron [mirtazapine]; Sulfur; Trimox [amoxicillin]; and Viibryd [vilazodone hcl]   Review of Systems Review of Systems  Constitutional: Negative for chills and fever.  HENT: Negative  for ear pain and sore throat.   Eyes: Negative for pain and visual disturbance.  Respiratory: Positive for cough and shortness of breath. Negative for chest tightness, wheezing and stridor.   Cardiovascular: Positive for chest pain. Negative for palpitations.  Gastrointestinal: Negative for abdominal pain, constipation, diarrhea, nausea and vomiting.  Endocrine: Negative for polyuria.  Genitourinary: Negative for dysuria and hematuria.  Musculoskeletal: Negative for arthralgias and back pain.  Skin: Negative for color change and rash.  Neurological: Negative for seizures and syncope.  Hematological: Does not bruise/bleed easily.  Psychiatric/Behavioral: Negative for agitation and behavioral problems.  All other systems reviewed and are negative.    Physical Exam Updated Vital Signs BP (!) 141/63 (BP Location: Right Arm)   Pulse 98   Temp (!) 101.3 F (38.5 C) (Oral)   Resp 19   Ht 5\' 1"  (1.549 m)   Wt 45.4 kg (100 lb)   SpO2 98%   BMI 18.89 kg/m   Physical Exam  Constitutional: She is oriented to person, place, and time. She appears well-developed and well-nourished. No distress.  HENT:  Head: Normocephalic and atraumatic.  Eyes: Pupils are equal, round, and reactive to light. Conjunctivae and EOM are normal.  Neck: Normal range of motion. Neck supple. Tracheal deviation present.  Cardiovascular: Normal rate and intact distal pulses.   No murmur heard. Pulmonary/Chest: Effort normal. No respiratory distress.  She has no wheezes. She has rales.  Crackles noted on the right lung base.  Abdominal: Soft. There is no tenderness.  Musculoskeletal: She exhibits no edema or deformity.  Neurological: She is alert and oriented to person, place, and time.  Skin: Skin is warm and dry.  Psychiatric: She has a normal mood and affect.  Nursing note and vitals reviewed.    ED Treatments / Results  Labs (all labs ordered are listed, but only abnormal results are displayed) Labs Reviewed  BASIC METABOLIC PANEL - Abnormal; Notable for the following:       Result Value   Sodium 129 (*)    Potassium 3.4 (*)    Chloride 96 (*)    Glucose, Bld 111 (*)    GFR calc non Af Amer 53 (*)    All other components within normal limits  CBC - Abnormal; Notable for the following:    RBC 3.55 (*)    Hemoglobin 9.5 (*)    HCT 29.5 (*)    All other components within normal limits  URINALYSIS, ROUTINE W REFLEX MICROSCOPIC - Abnormal; Notable for the following:    Hgb urine dipstick SMALL (*)    Protein, ur 30 (*)    Squamous Epithelial / LPF 0-5 (*)    All other components within normal limits  CULTURE, BLOOD (ROUTINE X 2)  CULTURE, BLOOD (ROUTINE X 2)  CULTURE, EXPECTORATED SPUTUM-ASSESSMENT  GRAM STAIN  STREP PNEUMONIAE URINARY ANTIGEN  CBC  BASIC METABOLIC PANEL  I-STAT TROPONIN, ED  I-STAT CG4 LACTIC ACID, ED  I-STAT CG4 LACTIC ACID, ED    EKG  EKG Interpretation  Date/Time:  Thursday November 25 2016 14:45:33 EDT Ventricular Rate:  94 PR Interval:    QRS Duration: 82 QT Interval:  353 QTC Calculation: 442 R Axis:   -14 Text Interpretation:  Sinus rhythm Atrial premature complex LVH by voltage Anterior Q waves, possibly due to LVH no acute ischemic changes  Confirmed by Brantley Stage [phone]) on 11/25/2016 3:18:51 PM       Radiology Dg Chest 2 View  Result Date: 11/25/2016 CLINICAL DATA:  Midline CP x several months, worse x today, cough (productive) x 1 month, nonsmoker, felt fatigued after being out  in the yard for the past few days as well EXAM: CHEST  2 VIEW COMPARISON:  09/21/2016 FINDINGS: Linear and coarse reticular opacities are noted at the lung bases, greater on the right, associated with right lung base bronchial wall thickening. This has increased when compared to the prior study. Lungs are hyperexpanded but otherwise clear. No pleural effusion or pneumothorax. Cardiac silhouette is normal in size. No mediastinal or hilar masses. No convincing adenopathy. Bony thorax is demineralized. There are vertebral compression fractures, 2 in the upper lumbar spine treated with prior vertebroplasty. One in the midthoracic spine un treated a new from thoracic radiographs dated 05/30/2012, but appearing to be present on the prior AP chest radiograph. It may be more severe. IMPRESSION: 1. Increased lung base opacity most evident on the right. This suggests bronchopneumonia. 2. No other acute cardiopulmonary abnormalities. No evidence of pulmonary edema. 3. Compression fracture of a midthoracic vertebra, moderate severity, likely chronic, but an acute fracture should be considered if there is mid back pain. This could be further assessed with thoracic MRI if desired clinically. Electronically Signed   By: Lajean Manes M.D.   On: 11/25/2016 15:27    Procedures Procedures (including critical care time)  Medications Ordered in ED Medications  ARIPiprazole (ABILIFY) tablet 2 mg (2 mg Oral Given 11/25/16 2127)  citalopram (CELEXA) tablet 40 mg (not administered)  pantoprazole (PROTONIX) EC tablet 40 mg (not administered)  ALPRAZolam (XANAX) tablet 0.25 mg (0.25 mg Oral Given 11/25/16 1750)  traZODone (DESYREL) tablet 25 mg (25 mg Oral Given 11/25/16 2127)  azelastine (ASTELIN) 0.1 % nasal spray 1 spray (not administered)  loratadine (CLARITIN) tablet 10 mg (not administered)  dorzolamide (TRUSOPT) 2 % ophthalmic solution 1 drop (1 drop Both Eyes Given 11/25/16 2128)  fluticasone (FLONASE) 50 MCG/ACT nasal  spray 1 spray (not administered)  latanoprost (XALATAN) 0.005 % ophthalmic solution 1 drop (1 drop Both Eyes Given 11/25/16 2128)  cholecalciferol (VITAMIN D) tablet 1,000 Units (not administered)  multivitamin with minerals tablet 1 tablet (not administered)  0.9 %  sodium chloride infusion ( Intravenous New Bag/Given 11/25/16 2026)  acetaminophen (TYLENOL) tablet 650 mg (650 mg Oral Given 11/25/16 2247)    Or  acetaminophen (TYLENOL) suppository 650 mg ( Rectal See Alternative 11/25/16 2247)  senna-docusate (Senokot-S) tablet 1 tablet (not administered)  cefTRIAXone (ROCEPHIN) 1 g in dextrose 5 % 50 mL IVPB (not administered)  azithromycin (ZITHROMAX) 500 mg in dextrose 5 % 250 mL IVPB (not administered)  traMADol (ULTRAM) tablet 50 mg (50 mg Oral Given 11/25/16 2127)  acidophilus (RISAQUAD) capsule 1 capsule (not administered)  cefTRIAXone (ROCEPHIN) 1 g in dextrose 5 % 50 mL IVPB (0 g Intravenous Stopped 11/25/16 1722)  ondansetron (ZOFRAN) injection 4 mg (4 mg Intravenous Given 11/25/16 1626)  potassium chloride SA (K-DUR,KLOR-CON) CR tablet 40 mEq (40 mEq Oral Given 11/25/16 2128)     Initial Impression / Assessment and Plan / ED Course  I have reviewed the triage vital signs and the nursing notes.  Pertinent labs & imaging results that were available during my care of the patient were reviewed by me and considered in my medical decision making (see chart for details).      Patient presenting with cough for 10 days. Associated chest pain. Does not appear cardiac in nature, troponin negative. EKG reassuring with no signs of ischemia. This x-ray does show a  right-sided pneumonia which is consistent with the history and my physical exam. Considering age and other comorbidities and a high PORT score, do believe inpatient treatment is necessary. Started on Rocephin and azithro while emergency department.  Patient will be admitted to medicine for further evaluation and management.  Patient was  seen with my attending, Dr. Oleta Mouse, who voiced agreement and oversaw the evaluation and treatment of this patient.   Dragon Field seismologist was used in the creation of this note. If there are any errors or inconsistencies needing clarification, please contact me directly.    Final Clinical Impressions(s) / ED Diagnoses   Final diagnoses:  Community acquired pneumonia of right lower lobe of lung [facility])    New Prescriptions Current Discharge Medication List       Valda Lamb, MD 11/26/16 0017    Forde Dandy, MD 11/26/16 [phone]

## 2016-11-25 NOTE — ED Triage Notes (Signed)
Per ems- pt comes from home c/o sob, general weakness for several days. This morning she had some chest pressure, and has had cough with green mucus. She called her doctor and they called 911, concerned she may have PNA. Given 324 aspirin. No CP, BP 124/82, HR 96, RR 14, 94% RA, CBG 123.

## 2016-11-25 NOTE — H&P (Signed)
[facility] History and Physical  PRESSLEY BARSKY MBE:[id number] DOB: 04/03/1925 DOA: 11/25/2016  Referring physician: Dr Fortino Sic PCP: Jani Gravel, MD   Chief Complaint: cough, sob  HPI: LATOYA MAULDING is a 83 y.o. female lives with son with pmh of femoral neck fx with admission 05/2015, lumbar compression fx, GERD, Barrett's esophagus, anxiety, depression presents to ED with 2 week hx of cough and shortness of breath. Pt was apparently seen in PCP's office today and sent to ED via ambulance for further evaluation though unable to verify this. Pt is poor historian and no family members available at time of admission. Pt describes approximate 2 week hx of cough productive of green sputum and sob. Pt denies any recent chest pain, no recent headache, dizziness, fever, abdominal pain, n/v/d. She denies noticing any dark stool.  ED COURSE Labs remarkable for hgb 9.5 (last available 10.3 in 1/17), Na 129, lactic acid 0.74. CXR consistent with RLL pna.   Review of Systems:  As stated in hpi, otherwise negative   Past Medical History:  Diagnosis Date  . Allergic rhinitis   . Anxiety   . Barrett's esophagus 06/1997  . Chondromalacia   . Depression   . Diverticulosis   . GERD (gastroesophageal reflux disease)   . Glaucoma   . Lumbar compression fracture (HCC)    L1 and L2  . Osteoporosis   . Pneumonia   . Polymyalgia ([place])   . Tubular adenoma of colon 06/1999  . Vaginitis   . Varicose veins     Social History: 4 children. Lives with son. Independent of ADLs  reports that she has never smoked. She has never used smokeless tobacco. She reports that she does not drink alcohol or use drugs.  Allergies  Allergen Reactions  . Avelox [Moxifloxacin Hcl In Nacl] Other (See Comments)    Felt bad  . Biaxin [Clarithromycin] Nausea Only  . Cefuroxime Diarrhea  . Levaquin [Levofloxacin In D5w] Diarrhea  . Minocycline Nausea And Vomiting  . Paxil [Paroxetine Hcl] Nausea And Vomiting  .  Prednisone Nausea Only  . Remeron [Mirtazapine]     Doesn't remember   . Sulfur Nausea And Vomiting  . Trimox [Amoxicillin] Other (See Comments)    Doesn't remember   . Viibryd [Vilazodone Hcl] Other (See Comments)    Doesn't remember     Family History  Problem Relation Age of Onset  . Colon cancer Mother 71  . Congestive Heart Failure Mother   . Heart disease Father   . Alcohol abuse Father   . Heart disease Maternal Grandmother   . Fibromyalgia Son      Prior to Admission medications   Medication Sig Start Date End Date Taking? Authorizing Provider  ALPRAZolam (XANAX) 0.25 MG tablet Take 1 tablet (0.25 mg total) by mouth 3 (three) times daily as needed for anxiety. 06/04/15  Yes Angiulli, Lavon Paganini, PA-C  ARIPiprazole (ABILIFY) 2 MG tablet Take 2 mg by mouth daily. 11/12/16  Yes [provider]  azelastine (ASTELIN) 137 MCG/SPRAY nasal spray Place 1 spray into the nose daily. Reported on 09/15/2015   Yes [provider]  cetirizine (ZYRTEC) 10 MG tablet Take 10 mg by mouth daily.   Yes [provider]  cholecalciferol (VITAMIN D) 1000 UNITS tablet Take 1,000 Units by mouth daily.   Yes [provider]  citalopram (CELEXA) 40 MG tablet Take 40 mg by mouth daily. 09/12/16  Yes [provider]  cyclobenzaprine (FLEXERIL) 10 MG tablet Take  10 mg by mouth 3 (three) times daily as needed for muscle spasms.    Yes [provider]  dorzolamide (TRUSOPT) 2 % ophthalmic solution Place 1 drop into both eyes 2 (two) times daily.    Yes [provider]  esomeprazole (NEXIUM) 40 MG capsule Take 1 capsule (40 mg total) by mouth daily. 07/23/16  Yes Ladene Artist, MD  fluticasone (FLONASE) 50 MCG/ACT nasal spray Place 1 spray into both nostrils daily.    Yes [provider]  glycopyrrolate (ROBINUL) 1 MG tablet Take 1 tablet (1 mg total) by mouth 2 (two) times daily. 07/23/16  Yes Ladene Artist, MD  HYDROcodone-acetaminophen  (NORCO) 5-325 MG tablet Take 1 tablet by mouth every 6 (six) hours as needed for moderate pain. 06/04/15  Yes Angiulli, Lavon Paganini, PA-C  ibuprofen (ADVIL,MOTRIN) 200 MG tablet Take 200 mg by mouth every 6 (six) hours as needed for mild pain.   Yes [provider]  latanoprost (XALATAN) 0.005 % ophthalmic solution Place 1 drop into both eyes at bedtime.   Yes [provider]  LORazepam (ATIVAN) 1 MG tablet Take 1 mg by mouth 2 (two) times daily as needed for anxiety.   Yes [provider]  Multiple Vitamin (MULTIVITAMIN) tablet Take 1 tablet by mouth daily.   Yes [provider]  Probiotic Product (ALIGN) 4 MG CAPS Take 4 mg by mouth daily.   Yes [provider]  traMADol (ULTRAM) 50 MG tablet Take 1 tablet (50 mg total) by mouth every 6 (six) hours as needed for moderate pain. 06/04/15  Yes Angiulli, Lavon Paganini, PA-C  traZODone (DESYREL) 50 MG tablet Take 0.5 tablets (25 mg total) by mouth at bedtime. Patient taking differently: Take 25 mg by mouth at bedtime as needed and may repeat dose one time if needed for sleep.  06/04/15  Yes Cathlyn Parsons, PA-C   Physical Exam: Vitals:   11/25/16 1441 11/25/16 1445  BP: 133/79   Pulse: 94   Resp: 14   Temp: 98.5 F (36.9 C)   TempSrc: Oral   SpO2: 94% 95%  Weight: 45.4 kg (100 lb)   Height: 5\' 1"  (1.549 m)     Wt Readings from Last 3 Encounters:  11/25/16 45.4 kg (100 lb)  07/23/16 43.7 kg (96 lb 6 oz)  09/15/15 44.9 kg (99 lb)    General: awake, alert lying supine in bed in NAD Eyes: EOMI, normal lids, iris ENT: grossly normal hearing, lips & tongue, mucous membranes dry Neck: no LAD, masses or thyromegaly Cardiovascular: S1S2 RRR, no m/r/g. No LE edema.  Respiratory: RLL crackles, no w/r/r. Normal respiratory effort. Abdomen: flat, soft, ntnd, BS+ Skin: no rash or induration seen on limited exam Musculoskeletal: grossly normal tone BUE/BLE Psychiatric: grossly normal mood and affect, speech  fluent and appropriate. Oriented to name and place.  Neurologic: CN 2-12 grossly intact          Labs on Admission:  Basic Metabolic Panel:  Recent Labs Lab 11/25/16 1500  NA 129*  K 3.4*  CL 96*  CO2 23  GLUCOSE 111*  BUN 14  CREATININE 0.92  CALCIUM 9.2   Liver Function Tests: No results for input(s): AST, ALT, ALKPHOS, BILITOT, PROT, ALBUMIN in the last 168 hours. No results for input(s): LIPASE, AMYLASE in the last 168 hours. No results for input(s): AMMONIA in the last 168 hours. CBC:  Recent Labs Lab 11/25/16 1500  WBC 9.7  HGB 9.5*  HCT 29.5*  MCV 83.1  PLT 316   Cardiac Enzymes: No results for input(s): CKTOTAL, CKMB, CKMBINDEX, TROPONINI in the last 168 hours.  BNP (last 3 results) No results for input(s): BNP in the last 8760 hours.  ProBNP (last 3 results) No results for input(s): PROBNP in the last 8760 hours.   Serum creatinine: 0.92 mg/dL 11/25/16 1500 Estimated creatinine clearance: 29.1 mL/min  CBG: No results for input(s): GLUCAP in the last 168 hours.  Radiological Exams on Admission: Dg Chest 2 View  Result Date: 11/25/2016 CLINICAL DATA:  Midline CP x several months, worse x today, cough (productive) x 1 month, nonsmoker, felt fatigued after being out in the yard for the past few days as well EXAM: CHEST  2 VIEW COMPARISON:  09/21/2016 FINDINGS: Linear and coarse reticular opacities are noted at the lung bases, greater on the right, associated with right lung base bronchial wall thickening. This has increased when compared to the prior study. Lungs are hyperexpanded but otherwise clear. No pleural effusion or pneumothorax. Cardiac silhouette is normal in size. No mediastinal or hilar masses. No convincing adenopathy. Bony thorax is demineralized. There are vertebral compression fractures, 2 in the upper lumbar spine treated with prior vertebroplasty. One in the midthoracic spine un treated a new from thoracic radiographs dated 05/30/2012, but  appearing to be present on the prior AP chest radiograph. It may be more severe. IMPRESSION: 1. Increased lung base opacity most evident on the right. This suggests bronchopneumonia. 2. No other acute cardiopulmonary abnormalities. No evidence of pulmonary edema. 3. Compression fracture of a midthoracic vertebra, moderate severity, likely chronic, but an acute fracture should be considered if there is mid back pain. This could be further assessed with thoracic MRI if desired clinically. Electronically Signed   By: Lajean Manes M.D.   On: 11/25/2016 15:27    EKG: Independently reviewed. 94bpm, no acute t wave abnormalities.   Assessment/Plan Active Problems:   Esophageal reflux   Falls   CAP (community acquired pneumonia)   Anemia   Hyponatremia   CAP -no evidence of sepsis with normal WBC, VSS, lactic acid 0.74 and NT appearing -IV azithromycin and Rocephin -sputum culture, blood culture x 2   Hyponatremia, mild -appears somewhat dehydrated on exam -gentle IVF's -monitor trend. If no improvement check urine studies.  Anemia, chronic normocytic -9.5<--10.3 on  -no evidence of ABL on exam -monitor trend  GERD -continue PPI  Anxiety/Depression -stable. Continue home xanax, zoloft. Will hold Ativan for now  Thoracic compression fx, chronic -incidental finding on chest xray and not new from 05/2015 -continue Ultram as prior to admit  Mild cognitive impairment -daughter reports pt at baseline and have suspected dementia for some time.  -oriented to name and place. Year 2017, month June.  -Suspect mild dementia. Follow up outpt  Code Status: full code for now. Need to discuss with son  who is not available right now DVT Prophylaxis: SCDs Family Communication:  Spoke with pt's daughter, [name], over the phone. Plan is for pt to return home where son takes care of her.  Disposition Plan: Pending Improvement    Dionne Milo, NP [phone] [facility] [email or web address] Password [email or web address]

## 2016-11-26 DIAGNOSIS — J181 Lobar pneumonia, unspecified organism: Secondary | ICD-10-CM | POA: Diagnosis not present

## 2016-11-26 DIAGNOSIS — J189 Pneumonia, unspecified organism: Secondary | ICD-10-CM | POA: Diagnosis not present

## 2016-11-26 LAB — CBC
HEMATOCRIT: 29.1 % — AB (ref 36.0–46.0)
HEMOGLOBIN: 9.2 g/dL — AB (ref 12.0–15.0)
MCH: 26.4 pg (ref 26.0–34.0)
MCHC: 31.6 g/dL (ref 30.0–36.0)
MCV: 83.4 fL (ref 78.0–100.0)
Platelets: 307 10*3/uL (ref 150–400)
RBC: 3.49 MIL/uL — AB (ref 3.87–5.11)
RDW: 15.6 % — ABNORMAL HIGH (ref 11.5–15.5)
WBC: 9.3 10*3/uL (ref 4.0–10.5)

## 2016-11-26 LAB — BLOOD CULTURE ID PANEL (REFLEXED)
ACINETOBACTER BAUMANNII: NOT DETECTED
CANDIDA ALBICANS: NOT DETECTED
CANDIDA KRUSEI: NOT DETECTED
CANDIDA PARAPSILOSIS: NOT DETECTED
Candida glabrata: NOT DETECTED
Candida tropicalis: NOT DETECTED
ENTEROBACTER CLOACAE COMPLEX: NOT DETECTED
ENTEROBACTERIACEAE SPECIES: NOT DETECTED
ENTEROCOCCUS SPECIES: NOT DETECTED
ESCHERICHIA COLI: NOT DETECTED
Haemophilus influenzae: NOT DETECTED
KLEBSIELLA OXYTOCA: NOT DETECTED
Klebsiella pneumoniae: NOT DETECTED
LISTERIA MONOCYTOGENES: NOT DETECTED
Methicillin resistance: DETECTED — AB
Neisseria meningitidis: NOT DETECTED
PSEUDOMONAS AERUGINOSA: NOT DETECTED
Proteus species: NOT DETECTED
STREPTOCOCCUS AGALACTIAE: NOT DETECTED
STREPTOCOCCUS PNEUMONIAE: NOT DETECTED
STREPTOCOCCUS PYOGENES: NOT DETECTED
Serratia marcescens: NOT DETECTED
Staphylococcus aureus (BCID): NOT DETECTED
Staphylococcus species: DETECTED — AB
Streptococcus species: NOT DETECTED

## 2016-11-26 LAB — BASIC METABOLIC PANEL
ANION GAP: 4 — AB (ref 5–15)
BUN: 10 mg/dL (ref 6–20)
CHLORIDE: 104 mmol/L (ref 101–111)
CO2: 25 mmol/L (ref 22–32)
Calcium: 8.7 mg/dL — ABNORMAL LOW (ref 8.9–10.3)
Creatinine, Ser: 0.83 mg/dL (ref 0.44–1.00)
GFR calc non Af Amer: >60 mL/min (ref >60)
Glucose, Bld: 98 mg/dL (ref 65–99)
POTASSIUM: 4.7 mmol/L (ref 3.5–5.1)
SODIUM: 133 mmol/L — AB (ref 135–145)

## 2016-11-26 MED ORDER — LEVOFLOXACIN 500 MG PO TABS: 500.0000 mg | ORAL_TABLET | Freq: Every day | ORAL | 0 refills | Status: DC

## 2016-11-26 MED ORDER — LEVOFLOXACIN 500 MG PO TABS: 500.0000 mg | ORAL_TABLET | ORAL | 0 refills | Status: AC

## 2016-11-26 NOTE — Care Management Note (Signed)
Case Management Note  Patient Details  Name: Katrina Burnett MRN: [id number] Date of Birth: March 05, 1925  Subjective/Objective:  Pt admitted on 11/25/16 with pneumonia.  PTA, pt independent, lives at home with her son.  She uses a walker at times for ambulation.                    Action/Plan: Pt for discharge home today with family to assist.  She denies any needs for home.  PCP is Jani Gravel.    No discharge needs identified.   Expected Discharge Date:  11/26/16               Expected Discharge Plan:  Home/Self Care  In-House Referral:     Discharge planning Services  CM Consult  Post Acute Care Choice:    Choice offered to:     DME Arranged:    DME Agency:     HH Arranged:    HH Agency:     Status of Service:  Completed, signed off  If discussed at [employer] of Stay Meetings, dates discussed:    Additional Comments:  Ella Bodo, RN 11/26/2016, 2:05 PM

## 2016-11-26 NOTE — Progress Notes (Signed)
PHARMACY - PHYSICIAN COMMUNICATION CRITICAL VALUE ALERT - BLOOD CULTURE IDENTIFICATION (BCID)  Results for orders placed or performed during the hospital encounter of 11/25/16  Blood Culture ID Panel (Reflexed) (Collected: 11/25/2016  4:17 PM)  Result Value Ref Range   Enterococcus species NOT DETECTED NOT DETECTED   Listeria monocytogenes NOT DETECTED NOT DETECTED   Staphylococcus species DETECTED (A) NOT DETECTED   Staphylococcus aureus NOT DETECTED NOT DETECTED   Methicillin resistance DETECTED (A) NOT DETECTED   Streptococcus species NOT DETECTED NOT DETECTED   Streptococcus agalactiae NOT DETECTED NOT DETECTED   Streptococcus pneumoniae NOT DETECTED NOT DETECTED   Streptococcus pyogenes NOT DETECTED NOT DETECTED   Acinetobacter baumannii NOT DETECTED NOT DETECTED   Enterobacteriaceae species NOT DETECTED NOT DETECTED   Enterobacter cloacae complex NOT DETECTED NOT DETECTED   Escherichia coli NOT DETECTED NOT DETECTED   Klebsiella oxytoca NOT DETECTED NOT DETECTED   Klebsiella pneumoniae NOT DETECTED NOT DETECTED   Proteus species NOT DETECTED NOT DETECTED   Serratia marcescens NOT DETECTED NOT DETECTED   Haemophilus influenzae NOT DETECTED NOT DETECTED   Neisseria meningitidis NOT DETECTED NOT DETECTED   Pseudomonas aeruginosa NOT DETECTED NOT DETECTED   Candida albicans NOT DETECTED NOT DETECTED   Candida glabrata NOT DETECTED NOT DETECTED   Candida krusei NOT DETECTED NOT DETECTED   Candida parapsilosis NOT DETECTED NOT DETECTED   Candida tropicalis NOT DETECTED NOT DETECTED    Name of physician (or Provider) Contacted: Dr. Verlon Au  Changes to prescribed antibiotics required: No changes needed, likely contaminant  Tad Moore 11/26/2016  4:13 PM

## 2016-11-26 NOTE — Discharge Summary (Addendum)
Physician Discharge Summary  Katrina Burnett KPT:[id number] DOB: 05/25/1924 DOA: 11/25/2016  PCP: Jani Gravel, MD  Admit date: 11/25/2016 Discharge date: 11/26/2016  Time spent: 25 minutes  Recommendations for Outpatient Follow-up:  1. Needs repeat chest x-ray two-view 1 month's time denote clearing of infiltrate 2. Will need Levaquin qod for 5 doses for community-acquired pneumonia-suggest outpatient workup for recurrent pneumonia including speech eval 3. Patient insistent on leaving hospital 7/20-tried to convince patient to stay in-house-patient feels better so discharging her the usual way with full course of antibiotics 4. Consider discontinuation or lower dosing of Abilify/SSRI as below   Discharge Diagnoses:  Active Problems:   Esophageal reflux   Falls   CAP (community acquired pneumonia)   Anemia   Hyponatremia   Discharge Condition: Improved  Diet recommendation: Regular  Filed Weights   11/25/16 1441  Weight: 45.4 kg (100 lb)    History of present illness:  89 ? Right intratrochanteric hip fracture status post ORIF-had CIR stay until 06/03/15 Polymyalgia rheumatica Bipolar Anemia Prior lumbar compression fractures-found to have thoracic compression fractures 2017 Mild cognitive impairments with mild dementia Barrett's esophagus   Came to emergency room 7/19 with reported two-week history cough, green sputum Poor historian-slightly confused   On admission sodium 129 BUN/creatinine 14/0.9 lactic acid 0.7 potassium normal White count 9.7 platelet 316 hemoglobin 9.5 and in keeping with prior results CXR 2 view = increased opacity most evident on the right   Hospital Course:    Community acquired pneumonia  Continue empiric azithromycin and ceftriaxone, narrowed quickly to by mouth Levaquin on discharge  Risk factor for aspiration include dementia which she seems to have-might need swallow eval as an outpatient since patient declines stating beyond today  Note  that patient is on Robinul which is presumably for secretions at homee  Bipolar  Continue Xanax 0.25 3 times a day when necessary anxiety  Continue Abilify 2 mg, Celexa 40 mg daily--note that both meds on the beers criteria and will need discussion with PCP regarding the same  Continue trazodone at bedtime for sleep  Seasonal allergies  Continue nasal spray Flonase and Astelin as well as Claritin  Chronic lumbar compression fractures  Holding Flexeril from admission 10 mg 3 times a day   Continue tramadol 50 mg every 12 (home dose every 6)  Holding ibuprofen for now  Meds were resumed as per home protocol on discharge   I had a chat with the son on the phone and he is fine taking her home today She'll be discharged home with evaluation for mobility by RN  Discharge Exam: Vitals:   11/25/16 2347 11/26/16 0548  BP:  137/71  Pulse:  96  Resp:  19  Temp: 98.8 F (37.1 C) 98.2 F (36.8 C)    Frail pleasant oriented slightly confused Relates to me that she's been sick for about 2 weeks called her PCPs office and finally came in at the suggestion of the nurse at the front desk that she might have pneumonia She's not had any further sputum or fever or chills She is eating and drinking somewhat She's been out of bed once but has not ambulated in the hallway at She has no other symptoms or complaints  EOMI NCAT Throat is soft supple Chest is clinically clear without rales rhonchi or adventitious sounds [id number]-[id number] holosystolic murmur 4/6 no tachycardia Abdomen soft nontender Range of motion intact Smile symmetric  Discharge Instructions    Current Discharge Medication List    START  taking these medications   Details  levofloxacin (LEVAQUIN) 500 MG tablet Take 1 tablet (500 mg total) by mouth every other day. Qty: 5 tablet, Refills: 0      CONTINUE these medications which have NOT CHANGED   Details  ALPRAZolam (XANAX) 0.25 MG tablet Take 1 tablet (0.25 mg total) by mouth 3  (three) times daily as needed for anxiety. Qty: 30 tablet, Refills: 0    ARIPiprazole (ABILIFY) 2 MG tablet Take 2 mg by mouth daily. Refills: 0    azelastine (ASTELIN) 137 MCG/SPRAY nasal spray Place 1 spray into the nose daily. Reported on 09/15/2015    cetirizine (ZYRTEC) 10 MG tablet Take 10 mg by mouth daily.    cholecalciferol (VITAMIN D) 1000 UNITS tablet Take 1,000 Units by mouth daily.    citalopram (CELEXA) 40 MG tablet Take 40 mg by mouth daily. Refills: 2    cyclobenzaprine (FLEXERIL) 10 MG tablet Take 10 mg by mouth 3 (three) times daily as needed for muscle spasms.     dorzolamide (TRUSOPT) 2 % ophthalmic solution Place 1 drop into both eyes 2 (two) times daily.     esomeprazole (NEXIUM) 40 MG capsule Take 1 capsule (40 mg total) by mouth daily. Qty: 30 capsule, Refills: 11    fluticasone (FLONASE) 50 MCG/ACT nasal spray Place 1 spray into both nostrils daily.     glycopyrrolate (ROBINUL) 1 MG tablet Take 1 tablet (1 mg total) by mouth 2 (two) times daily. Qty: 60 tablet, Refills: 11    HYDROcodone-acetaminophen (NORCO) 5-325 MG tablet Take 1 tablet by mouth every 6 (six) hours as needed for moderate pain. Qty: 90 tablet, Refills: 0    ibuprofen (ADVIL,MOTRIN) 200 MG tablet Take 200 mg by mouth every 6 (six) hours as needed for mild pain.    latanoprost (XALATAN) 0.005 % ophthalmic solution Place 1 drop into both eyes at bedtime.    LORazepam (ATIVAN) 1 MG tablet Take 1 mg by mouth 2 (two) times daily as needed for anxiety.    Multiple Vitamin (MULTIVITAMIN) tablet Take 1 tablet by mouth daily.    Probiotic Product (ALIGN) 4 MG CAPS Take 4 mg by mouth daily.    traMADol (ULTRAM) 50 MG tablet Take 1 tablet (50 mg total) by mouth every 6 (six) hours as needed for moderate pain. Qty: 30 tablet, Refills: 0    traZODone (DESYREL) 50 MG tablet Take 0.5 tablets (25 mg total) by mouth at bedtime. Qty: 30 tablet, Refills: 0       Allergies  Allergen Reactions  .  Avelox [Moxifloxacin Hcl In Nacl] Other (See Comments)    Felt bad  . Biaxin [Clarithromycin] Nausea Only  . Cefuroxime Diarrhea  . Levaquin [Levofloxacin In D5w] Diarrhea  . Minocycline Nausea And Vomiting  . Paxil [Paroxetine Hcl] Nausea And Vomiting  . Prednisone Nausea Only  . Remeron [Mirtazapine]     Doesn't remember   . Sulfur Nausea And Vomiting  . Trimox [Amoxicillin] Other (See Comments)    Doesn't remember   . Viibryd [Vilazodone Hcl] Other (See Comments)    Doesn't remember       The results of significant diagnostics from this hospitalization (including imaging, microbiology, ancillary and laboratory) are listed below for reference.    Significant Diagnostic Studies: Dg Chest 2 View  Result Date: 11/25/2016 CLINICAL DATA:  Midline CP x several months, worse x today, cough (productive) x 1 month, nonsmoker, felt fatigued after being out in the yard for the past few days  as well EXAM: CHEST  2 VIEW COMPARISON:  09/21/2016 FINDINGS: Linear and coarse reticular opacities are noted at the lung bases, greater on the right, associated with right lung base bronchial wall thickening. This has increased when compared to the prior study. Lungs are hyperexpanded but otherwise clear. No pleural effusion or pneumothorax. Cardiac silhouette is normal in size. No mediastinal or hilar masses. No convincing adenopathy. Bony thorax is demineralized. There are vertebral compression fractures, 2 in the upper lumbar spine treated with prior vertebroplasty. One in the midthoracic spine un treated a new from thoracic radiographs dated 05/30/2012, but appearing to be present on the prior AP chest radiograph. It may be more severe. IMPRESSION: 1. Increased lung base opacity most evident on the right. This suggests bronchopneumonia. 2. No other acute cardiopulmonary abnormalities. No evidence of pulmonary edema. 3. Compression fracture of a midthoracic vertebra, moderate severity, likely chronic, but an  acute fracture should be considered if there is mid back pain. This could be further assessed with thoracic MRI if desired clinically. Electronically Signed   By: Lajean Manes M.D.   On: 11/25/2016 15:27    Microbiology: No results found for this or any previous visit (from the past 240 hour(s)).   Labs: Basic Metabolic Panel:  Recent Labs Lab 11/25/16 1500 11/26/16 0414  NA 129* 133*  K 3.4* 4.7  CL 96* 104  CO2 23 25  GLUCOSE 111* 98  BUN 14 10  CREATININE 0.92 0.83  CALCIUM 9.2 8.7*   Liver Function Tests: No results for input(s): AST, ALT, ALKPHOS, BILITOT, PROT, ALBUMIN in the last 168 hours. No results for input(s): LIPASE, AMYLASE in the last 168 hours. No results for input(s): AMMONIA in the last 168 hours. CBC:  Recent Labs Lab 11/25/16 1500 11/26/16 0414  WBC 9.7 9.3  HGB 9.5* 9.2*  HCT 29.5* 29.1*  MCV 83.1 83.4  PLT 316 307   Cardiac Enzymes: No results for input(s): CKTOTAL, CKMB, CKMBINDEX, TROPONINI in the last 168 hours. BNP: BNP (last 3 results) No results for input(s): BNP in the last 8760 hours.  ProBNP (last 3 results) No results for input(s): PROBNP in the last 8760 hours.  CBG: No results for input(s): GLUCAP in the last 168 hours.     SignedNita Sells MD   [facility] 11/26/2016, 10:30 AM

## 2016-11-26 NOTE — Progress Notes (Signed)
Pt discharged home in stable condition. Discharge instructions provided before leaving unit. AVS also given to pt and her daughter prior to discharge

## 2016-11-26 NOTE — Care Management Obs Status (Signed)
[facility] NOTIFICATION   Patient Details  Name: Katrina Burnett MRN: [id number] Date of Birth: March 20, 1925   Medicare Observation Status Notification Given:  Yes    Ella Bodo, RN 11/26/2016, 12:18 PM

## 2016-11-28 LAB — CULTURE, BLOOD (ROUTINE X 2): SPECIAL REQUESTS: ADEQUATE

## 2016-11-30 LAB — CULTURE, BLOOD (ROUTINE X 2)
CULTURE: NO GROWTH
SPECIAL REQUESTS: ADEQUATE

## 2016-12-09 DIAGNOSIS — Z09 Encounter for follow-up examination after completed treatment for conditions other than malignant neoplasm: Secondary | ICD-10-CM | POA: Diagnosis not present

## 2016-12-09 DIAGNOSIS — J189 Pneumonia, unspecified organism: Secondary | ICD-10-CM | POA: Diagnosis not present

## 2016-12-09 DIAGNOSIS — E039 Hypothyroidism, unspecified: Secondary | ICD-10-CM | POA: Diagnosis not present

## 2016-12-09 DIAGNOSIS — I700 Atherosclerosis of aorta: Secondary | ICD-10-CM | POA: Diagnosis not present

## 2016-12-20 ENCOUNTER — Other Ambulatory Visit (HOSPITAL_COMMUNITY): Payer: Self-pay | Admitting: Respiratory Therapy

## 2016-12-20 DIAGNOSIS — R05 Cough: Secondary | ICD-10-CM

## 2016-12-20 DIAGNOSIS — F419 Anxiety disorder, unspecified: Secondary | ICD-10-CM | POA: Diagnosis not present

## 2016-12-20 DIAGNOSIS — J449 Chronic obstructive pulmonary disease, unspecified: Secondary | ICD-10-CM | POA: Diagnosis not present

## 2016-12-20 DIAGNOSIS — I8391 Asymptomatic varicose veins of right lower extremity: Secondary | ICD-10-CM | POA: Diagnosis not present

## 2016-12-20 DIAGNOSIS — Z23 Encounter for immunization: Secondary | ICD-10-CM | POA: Diagnosis not present

## 2016-12-20 DIAGNOSIS — R053 Chronic cough: Secondary | ICD-10-CM

## 2016-12-20 DIAGNOSIS — J181 Lobar pneumonia, unspecified organism: Secondary | ICD-10-CM | POA: Diagnosis not present

## 2016-12-24 ENCOUNTER — Encounter (HOSPITAL_COMMUNITY): Payer: Medicare Other

## 2017-01-06 ENCOUNTER — Ambulatory Visit (HOSPITAL_COMMUNITY): Payer: Medicare Other

## 2017-01-17 DIAGNOSIS — Z5181 Encounter for therapeutic drug level monitoring: Secondary | ICD-10-CM | POA: Diagnosis not present

## 2017-01-17 DIAGNOSIS — E039 Hypothyroidism, unspecified: Secondary | ICD-10-CM | POA: Diagnosis not present

## 2017-01-17 DIAGNOSIS — M545 Low back pain: Secondary | ICD-10-CM | POA: Diagnosis not present

## 2017-01-20 DIAGNOSIS — Z79899 Other long term (current) drug therapy: Secondary | ICD-10-CM | POA: Diagnosis not present

## 2017-01-20 DIAGNOSIS — F419 Anxiety disorder, unspecified: Secondary | ICD-10-CM | POA: Diagnosis not present

## 2017-01-20 DIAGNOSIS — Z0000 Encounter for general adult medical examination without abnormal findings: Secondary | ICD-10-CM | POA: Diagnosis not present

## 2017-02-24 DIAGNOSIS — M25551 Pain in right hip: Secondary | ICD-10-CM | POA: Diagnosis not present

## 2017-03-08 DIAGNOSIS — M25551 Pain in right hip: Secondary | ICD-10-CM | POA: Diagnosis not present

## 2017-04-04 DIAGNOSIS — H524 Presbyopia: Secondary | ICD-10-CM | POA: Diagnosis not present

## 2017-04-04 DIAGNOSIS — H401131 Primary open-angle glaucoma, bilateral, mild stage: Secondary | ICD-10-CM | POA: Diagnosis not present

## 2017-04-07 DIAGNOSIS — I872 Venous insufficiency (chronic) (peripheral): Secondary | ICD-10-CM | POA: Diagnosis not present

## 2017-04-07 DIAGNOSIS — L0390 Cellulitis, unspecified: Secondary | ICD-10-CM | POA: Diagnosis not present

## 2017-04-14 DIAGNOSIS — L0390 Cellulitis, unspecified: Secondary | ICD-10-CM | POA: Diagnosis not present

## 2017-04-14 DIAGNOSIS — I872 Venous insufficiency (chronic) (peripheral): Secondary | ICD-10-CM | POA: Diagnosis not present

## 2017-06-16 DIAGNOSIS — E039 Hypothyroidism, unspecified: Secondary | ICD-10-CM | POA: Diagnosis not present

## 2017-06-16 DIAGNOSIS — E559 Vitamin D deficiency, unspecified: Secondary | ICD-10-CM | POA: Diagnosis not present

## 2017-06-16 DIAGNOSIS — Z79899 Other long term (current) drug therapy: Secondary | ICD-10-CM | POA: Diagnosis not present

## 2017-06-16 DIAGNOSIS — F419 Anxiety disorder, unspecified: Secondary | ICD-10-CM | POA: Diagnosis not present

## 2017-06-16 DIAGNOSIS — E7800 Pure hypercholesterolemia, unspecified: Secondary | ICD-10-CM | POA: Diagnosis not present

## 2017-06-16 DIAGNOSIS — Z0000 Encounter for general adult medical examination without abnormal findings: Secondary | ICD-10-CM | POA: Diagnosis not present

## 2017-06-16 DIAGNOSIS — Z5181 Encounter for therapeutic drug level monitoring: Secondary | ICD-10-CM | POA: Diagnosis not present

## 2017-06-23 DIAGNOSIS — M1990 Unspecified osteoarthritis, unspecified site: Secondary | ICD-10-CM | POA: Diagnosis not present

## 2017-06-23 DIAGNOSIS — F419 Anxiety disorder, unspecified: Secondary | ICD-10-CM | POA: Diagnosis not present

## 2017-06-23 DIAGNOSIS — L219 Seborrheic dermatitis, unspecified: Secondary | ICD-10-CM | POA: Diagnosis not present

## 2017-06-23 DIAGNOSIS — Z0000 Encounter for general adult medical examination without abnormal findings: Secondary | ICD-10-CM | POA: Diagnosis not present

## 2017-06-23 DIAGNOSIS — E7800 Pure hypercholesterolemia, unspecified: Secondary | ICD-10-CM | POA: Diagnosis not present

## 2017-06-23 DIAGNOSIS — Z79899 Other long term (current) drug therapy: Secondary | ICD-10-CM | POA: Diagnosis not present

## 2017-06-24 ENCOUNTER — Other Ambulatory Visit: Payer: Self-pay | Admitting: Gastroenterology

## 2017-08-22 ENCOUNTER — Other Ambulatory Visit: Payer: Self-pay

## 2017-08-22 MED ORDER — ESOMEPRAZOLE MAGNESIUM 40 MG PO CPDR: 40.0000 mg | DELAYED_RELEASE_CAPSULE | Freq: Every day | ORAL | 0 refills | Status: DC

## 2017-09-14 DIAGNOSIS — Z79899 Other long term (current) drug therapy: Secondary | ICD-10-CM | POA: Diagnosis not present

## 2017-09-14 DIAGNOSIS — E7800 Pure hypercholesterolemia, unspecified: Secondary | ICD-10-CM | POA: Diagnosis not present

## 2017-09-14 DIAGNOSIS — Z5181 Encounter for therapeutic drug level monitoring: Secondary | ICD-10-CM | POA: Diagnosis not present

## 2017-09-15 ENCOUNTER — Other Ambulatory Visit: Payer: Self-pay | Admitting: Gastroenterology

## 2017-09-16 DIAGNOSIS — F419 Anxiety disorder, unspecified: Secondary | ICD-10-CM | POA: Diagnosis not present

## 2017-09-16 DIAGNOSIS — E7800 Pure hypercholesterolemia, unspecified: Secondary | ICD-10-CM | POA: Diagnosis not present

## 2017-10-14 DIAGNOSIS — Z79899 Other long term (current) drug therapy: Secondary | ICD-10-CM | POA: Diagnosis not present

## 2017-10-14 DIAGNOSIS — E7800 Pure hypercholesterolemia, unspecified: Secondary | ICD-10-CM | POA: Diagnosis not present

## 2017-10-14 DIAGNOSIS — F419 Anxiety disorder, unspecified: Secondary | ICD-10-CM | POA: Diagnosis not present

## 2017-10-14 DIAGNOSIS — E039 Hypothyroidism, unspecified: Secondary | ICD-10-CM | POA: Diagnosis not present

## 2017-11-18 ENCOUNTER — Other Ambulatory Visit: Payer: Self-pay | Admitting: Gastroenterology

## 2017-12-24 ENCOUNTER — Other Ambulatory Visit: Payer: Self-pay | Admitting: Gastroenterology

## 2017-12-26 ENCOUNTER — Telehealth: Payer: Self-pay | Admitting: Gastroenterology

## 2017-12-27 MED ORDER — ESOMEPRAZOLE MAGNESIUM 40 MG PO CPDR: DELAYED_RELEASE_CAPSULE | ORAL | 1 refills | Status: DC

## 2017-12-27 NOTE — Telephone Encounter (Signed)
Prescription sent to patient's pharmacy and patient notified.  

## 2018-01-05 DIAGNOSIS — L97919 Non-pressure chronic ulcer of unspecified part of right lower leg with unspecified severity: Secondary | ICD-10-CM | POA: Diagnosis not present

## 2018-01-05 DIAGNOSIS — L0390 Cellulitis, unspecified: Secondary | ICD-10-CM | POA: Diagnosis not present

## 2018-01-05 DIAGNOSIS — I872 Venous insufficiency (chronic) (peripheral): Secondary | ICD-10-CM | POA: Diagnosis not present

## 2018-01-11 DIAGNOSIS — E039 Hypothyroidism, unspecified: Secondary | ICD-10-CM | POA: Diagnosis not present

## 2018-01-11 DIAGNOSIS — E7800 Pure hypercholesterolemia, unspecified: Secondary | ICD-10-CM | POA: Diagnosis not present

## 2018-01-11 DIAGNOSIS — Z79899 Other long term (current) drug therapy: Secondary | ICD-10-CM | POA: Diagnosis not present

## 2018-01-16 DIAGNOSIS — Z5181 Encounter for therapeutic drug level monitoring: Secondary | ICD-10-CM | POA: Diagnosis not present

## 2018-01-16 DIAGNOSIS — M159 Polyosteoarthritis, unspecified: Secondary | ICD-10-CM | POA: Diagnosis not present

## 2018-01-16 DIAGNOSIS — Z23 Encounter for immunization: Secondary | ICD-10-CM | POA: Diagnosis not present

## 2018-01-16 DIAGNOSIS — G4700 Insomnia, unspecified: Secondary | ICD-10-CM | POA: Diagnosis not present

## 2018-01-16 DIAGNOSIS — F411 Generalized anxiety disorder: Secondary | ICD-10-CM | POA: Diagnosis not present

## 2018-02-13 ENCOUNTER — Ambulatory Visit (INDEPENDENT_AMBULATORY_CARE_PROVIDER_SITE_OTHER): Payer: Medicare Other | Admitting: Gastroenterology

## 2018-02-13 ENCOUNTER — Encounter: Payer: Self-pay | Admitting: Gastroenterology

## 2018-02-13 VITALS — BP 118/72 | HR 64 | Ht <= 58 in | Wt 93.2 lb

## 2018-02-13 DIAGNOSIS — K219 Gastro-esophageal reflux disease without esophagitis: Secondary | ICD-10-CM

## 2018-02-13 MED ORDER — ESOMEPRAZOLE MAGNESIUM 40 MG PO CPDR: DELAYED_RELEASE_CAPSULE | ORAL | 11 refills | Status: DC

## 2018-02-13 NOTE — Patient Instructions (Signed)
We have sent the following medications to your pharmacy for you to pick up at your convenience: esomeprazole.   Thank you for choosing me and [facility].  Pricilla Riffle. Dagoberto Ligas., MD., Marval Regal

## 2018-02-13 NOTE — Progress Notes (Signed)
    History of Present Illness: This is a 84 year old female returning for follow-up of GERD.  She states her reflux symptoms are very well controlled on her current regimen.  She occasionally has constipation when she takes hydrocodone.  No other gastrointestinal complaints.  Current Medications, Allergies, Past Medical History, Past Surgical History, Family History and Social History were reviewed in [employer] record.  Physical Exam: General: Well developed, well nourished, thin, elderly, no acute distress Head: Normocephalic and atraumatic Eyes:  sclerae anicteric, EOMI Ears: Normal auditory acuity Mouth: No deformity or lesions Lungs: Clear throughout to auscultation Heart: Regular rate and rhythm; no murmurs, rubs or bruits Abdomen: Soft, non tender and non distended. No masses, hepatosplenomegaly or hernias noted. Normal Bowel sounds Rectal: Not done Musculoskeletal: Symmetrical with no gross deformities  Pulses:  Normal pulses noted Extremities: No clubbing, cyanosis, edema or deformities noted Neurological: Alert oriented x 4, grossly nonfocal Psychological:  Alert and cooperative. Normal mood and affect   Assessment and Recommendations:  1.  GERD.  Continue esomeprazole 40 mg daily and follow standard antireflux measures. REV in 1 year.   2.  Mild constipation.  MiraLAX daily as needed.

## 2018-02-20 DIAGNOSIS — M545 Low back pain: Secondary | ICD-10-CM | POA: Diagnosis not present

## 2018-03-02 ENCOUNTER — Encounter (HOSPITAL_COMMUNITY): Payer: Self-pay

## 2018-03-02 ENCOUNTER — Other Ambulatory Visit: Payer: Self-pay

## 2018-03-02 ENCOUNTER — Emergency Department (HOSPITAL_COMMUNITY): Payer: Medicare Other

## 2018-03-02 ENCOUNTER — Emergency Department (HOSPITAL_COMMUNITY)
Admission: EM | Admit: 2018-03-02 | Discharge: 2018-03-02 | Disposition: A | Discharge status: Home / Self Care | Payer: Medicare Other | Attending: Emergency Medicine | Admitting: Emergency Medicine

## 2018-03-02 DIAGNOSIS — Z87891 Personal history of nicotine dependence: Secondary | ICD-10-CM | POA: Insufficient documentation

## 2018-03-02 DIAGNOSIS — R0902 Hypoxemia: Secondary | ICD-10-CM | POA: Diagnosis not present

## 2018-03-02 DIAGNOSIS — M5489 Other dorsalgia: Secondary | ICD-10-CM | POA: Insufficient documentation

## 2018-03-02 DIAGNOSIS — M549 Dorsalgia, unspecified: Secondary | ICD-10-CM

## 2018-03-02 DIAGNOSIS — M546 Pain in thoracic spine: Secondary | ICD-10-CM | POA: Diagnosis not present

## 2018-03-02 DIAGNOSIS — R0789 Other chest pain: Secondary | ICD-10-CM | POA: Diagnosis not present

## 2018-03-02 DIAGNOSIS — Z79899 Other long term (current) drug therapy: Secondary | ICD-10-CM | POA: Diagnosis not present

## 2018-03-02 MED ORDER — OXYCODONE-ACETAMINOPHEN 5-325 MG PO TABS
1.0000 | ORAL_TABLET | Freq: Once | ORAL | Status: AC
  Administered 2018-03-02: 1 via ORAL
  Filled 2018-03-02: qty 1

## 2018-03-02 MED ORDER — OXYCODONE-ACETAMINOPHEN 5-325 MG PO TABS: 1.0000 | ORAL_TABLET | Freq: Four times a day (QID) | ORAL | 0 refills | Status: DC | PRN

## 2018-03-02 NOTE — ED Notes (Signed)
Bed: [facility] Expected date:  Expected time:  Means of arrival:  Comments: EMS back pain 

## 2018-03-02 NOTE — ED Notes (Signed)
Pt and family requesting update multiple times. Pt and family updated multiple times. Pt and family informed that MD would be over shortly to speak with them. Pts daughter states " When will that be?"

## 2018-03-02 NOTE — ED Notes (Signed)
Pt and family requesting to speak to MD. Roderic Palau MD made aware. Pt and family updated

## 2018-03-02 NOTE — ED Notes (Addendum)
Per MD: Pt may eat. Pt provided with graham crackers and coke.

## 2018-03-02 NOTE — ED Triage Notes (Signed)
Pt arrives from home via [facility]. Pt reports she was doing yard work 5 days ago and in the following pain has had worsening thoracic lateral back pain. Pt denies fall/injury/trauma. Pt seen at PCP- Negative Xray. Pt complaining of worsening pain.

## 2018-03-02 NOTE — ED Notes (Signed)
ED Provider at bedside. 

## 2018-03-02 NOTE — ED Notes (Signed)
Pt escorted to restroom with steady. Pt asked to ambulate back to bed. Pt ambulated without difficulty.

## 2018-03-02 NOTE — ED Provider Notes (Signed)
[facility] DEPT Provider Note   CSN: [id number] Arrival date & time: 03/02/18  1032     History   Chief Complaint No chief complaint on file.   HPI Katrina Burnett is a 84 y.o. female.  Patient states she has pain in her upper back bilaterally after she was doing a lot of yard work  The history is provided by the patient. No language interpreter was used.  Back Pain   This is a new problem. The current episode started 6 to 12 hours ago. The problem occurs constantly. The problem has not changed since onset.The pain is associated with lifting heavy objects. The pain is present in the thoracic spine. The quality of the pain is described as stabbing. The pain does not radiate. The pain is at a severity of 4/10. The pain is moderate. The symptoms are aggravated by bending. The pain is the same all the time. Pertinent negatives include no chest pain, no headaches and no abdominal pain.    Past Medical History:  Diagnosis Date  . Allergic rhinitis   . Anxiety   . Arthritis    "hands" (11/25/2016)  . Barrett's esophagus 06/1997  . CAP (community acquired pneumonia) 11/25/2016   "this is my 3rd time having pneumonia" (11/25/2016)  . Chondromalacia   . Chronic lower back pain   . Depression   . Diverticulosis   . GERD (gastroesophageal reflux disease)   . Glaucoma   . History of hiatal hernia   . History of stomach ulcers   . Lumbar compression fracture (HCC)    L1 and L2  . Osteoporosis   . Pneumonia   . Polymyalgia ([place])   . Tubular adenoma of colon 06/1999  . Vaginitis   . Varicose veins     Patient Active Problem List   Diagnosis Date Noted  . CAP (community acquired pneumonia) 11/25/2016  . Anemia 11/25/2016  . Hyponatremia 11/25/2016  . Fracture, intertrochanteric, right femur ([place]) 05/26/2015  . Closed fracture of femur ([place])   . Post-operative pain   . Acute blood loss anemia   . Adjustment disorder with mixed anxiety and depressed  mood   . Falls   . Abnormality of gait   . Femur fracture, right, closed, initial encounter 05/23/2015  . Fracture of femoral neck, right, closed ([place]) 05/22/2015  . Femoral neck fracture, right, closed, initial encounter 05/22/2015  . Syncope 05/30/2012  . Nausea & vomiting 05/30/2012  . Chest pain 05/30/2012  . Hypotension 05/30/2012  . Esophageal reflux 12/16/2011  . Lumbar compression fracture ([place])   . Polymyalgia ([place])     Past Surgical History:  Procedure Laterality Date  . BACK SURGERY    . CATARACT EXTRACTION W/ INTRAOCULAR LENS  IMPLANT, BILATERAL Bilateral   . FIXATION KYPHOPLASTY LUMBAR SPINE  10/12/10; 09/20/11  . FRACTURE SURGERY    . LUMBAR SPINE SURGERY     "did OR on 2 fractures in my lower back"  . LUNG BIOPSY    . ORIF HIP FRACTURE Right 05/23/2015   Procedure: OPEN REDUCTION INTERNAL FIXATION HIP;  Surgeon: Frederik Pear, MD;  Location: [facility];  Service: Orthopedics;  Laterality: Right;  Marland Kitchen VARICOSE VEIN SURGERY     vein ligation and stripping "years ago"     OB History    Gravida  4   Para  4   Term  4   Preterm      AB      Living  4  SAB      TAB      Ectopic      Multiple      Live Births               Home Medications    Prior to Admission medications   Medication Sig Start Date End Date Taking? Authorizing Provider  cetirizine (ZYRTEC) 10 MG tablet Take 10 mg by mouth daily.   Yes [provider]  cholecalciferol (VITAMIN D) 1000 UNITS tablet Take 1,000 Units by mouth daily.   Yes [provider]  citalopram (CELEXA) 40 MG tablet Take 40 mg by mouth daily. 09/12/16  Yes [provider]  cyclobenzaprine (FLEXERIL) 10 MG tablet Take 10 mg by mouth 3 (three) times daily as needed for muscle spasms.    Yes [provider]  dorzolamide (TRUSOPT) 2 % ophthalmic solution Place 1 drop into both eyes 2 (two) times daily.    Yes [provider]  esomeprazole (NEXIUM) 40 MG capsule TAKE 1 CAPSULE  BY MOUTH EVERY DAY Patient taking differently: Take 40 mg by mouth daily.  02/13/18  Yes Ladene Artist, MD  fluticasone (FLONASE) 50 MCG/ACT nasal spray Place 1 spray into both nostrils daily as needed for allergies.    Yes [provider]  HYDROcodone-acetaminophen (NORCO) 5-325 MG tablet Take 1 tablet by mouth every 6 (six) hours as needed for moderate pain. 06/04/15  Yes Angiulli, Lavon Paganini, PA-C  ibuprofen (ADVIL,MOTRIN) 200 MG tablet Take 200 mg by mouth every 6 (six) hours as needed for mild pain.   Yes [provider]  ketoconazole (NIZORAL) 2 % shampoo Apply 1 application topically daily as needed for irritation.  02/10/18  Yes [provider]  latanoprost (XALATAN) 0.005 % ophthalmic solution Place 1 drop into both eyes at bedtime.   Yes [provider]  LORazepam (ATIVAN) 1 MG tablet Take 1 mg by mouth 2 (two) times daily as needed for anxiety.   Yes [provider]  Multiple Vitamin (MULTIVITAMIN) tablet Take 1 tablet by mouth daily.   Yes [provider]  Probiotic Product (ALIGN) 4 MG CAPS Take 4 mg by mouth daily.   Yes [provider]  traMADol (ULTRAM) 50 MG tablet Take 1 tablet (50 mg total) by mouth every 6 (six) hours as needed for moderate pain. 06/04/15  Yes Angiulli, Lavon Paganini, PA-C  traZODone (DESYREL) 50 MG tablet Take 0.5 tablets (25 mg total) by mouth at bedtime. Patient taking differently: Take 25-50 mg by mouth at bedtime as needed for sleep.  06/04/15  Yes Angiulli, Lavon Paganini, PA-C  glycopyrrolate (ROBINUL) 1 MG tablet Take 1 tablet (1 mg total) by mouth 2 (two) times daily. Patient not taking: Reported on 03/02/2018 07/23/16   Ladene Artist, MD  oxyCODONE-acetaminophen (PERCOCET) 5-325 MG tablet Take 1-2 tablets by mouth every 6 (six) hours as needed. 03/02/18   Milton Ferguson, MD    Family History Family History  Problem Relation Age of Onset  . Colon cancer Mother 83  . Congestive Heart Failure Mother     . Heart disease Father   . Alcohol abuse Father   . Heart disease Maternal Grandmother   . Fibromyalgia Son     Social History Social History   Tobacco Use  . Smoking status: Former Smoker    Years: 1.00  . Smokeless tobacco: Never Used  . Tobacco comment: At age 10 yrs old but nothing since   Substance Use Topics  .  Alcohol use: No  . Drug use: No     Allergies   Avelox [moxifloxacin hcl in nacl]; Biaxin [clarithromycin]; Cefuroxime; Levaquin [levofloxacin in d5w]; Minocycline; Paxil [paroxetine hcl]; Prednisone; Remeron [mirtazapine]; Sulfur; Trimox [amoxicillin]; and Viibryd [vilazodone hcl]   Review of Systems Review of Systems  Constitutional: Negative for appetite change and fatigue.  HENT: Negative for congestion, ear discharge and sinus pressure.   Eyes: Negative for discharge.  Respiratory: Negative for cough.   Cardiovascular: Negative for chest pain.  Gastrointestinal: Negative for abdominal pain and diarrhea.  Genitourinary: Negative for frequency and hematuria.  Musculoskeletal: Positive for back pain.  Skin: Negative for rash.  Neurological: Negative for seizures and headaches.  Psychiatric/Behavioral: Negative for hallucinations.     Physical Exam Updated Vital Signs BP 139/75 (BP Location: Right Arm)   Pulse 86   Temp 98.4 F (36.9 C) (Oral)   Resp 16   Wt 42.6 kg   SpO2 96%   BMI 19.65 kg/m   Physical Exam  Constitutional: She is oriented to person, place, and time. She appears well-developed.  HENT:  Head: Normocephalic.  Eyes: Conjunctivae and EOM are normal. No scleral icterus.  Neck: Neck supple. No thyromegaly present.  Cardiovascular: Normal rate and regular rhythm. Exam reveals no gallop and no friction rub.  No murmur heard. Pulmonary/Chest: No stridor. She has no wheezes. She has no rales. She exhibits no tenderness.  Abdominal: She exhibits no distension. There is no tenderness. There is no rebound.  Musculoskeletal: Normal  range of motion. She exhibits no edema.  Tender upper back bilaterally  Lymphadenopathy:    She has no cervical adenopathy.  Neurological: She is oriented to person, place, and time. She exhibits normal muscle tone. Coordination normal.  Skin: No rash noted. No erythema.  Psychiatric: She has a normal mood and affect. Her behavior is normal.     ED Treatments / Results  Labs (all labs ordered are listed, but only abnormal results are displayed) Labs Reviewed - No data to display  EKG None  Radiology Ct Chest Wo Contrast  Result Date: 03/02/2018 CLINICAL DATA:  Lateral chest pain following yard work several days ago common no known fall, initial encounter EXAM: CT CHEST WITHOUT CONTRAST TECHNIQUE: Multidetector CT imaging of the chest was performed following the standard protocol without IV contrast. COMPARISON:  12/09/2016 chest x-ray, chest CT from 05/30/2012 FINDINGS: Cardiovascular: Somewhat limited due to lack of IV contrast. Aortic calcifications are seen without aneurysmal dilatation. No cardiac enlargement is noted. No pericardial effusion is seen. Scattered coronary calcifications are noted. Mediastinum/Nodes: Thoracic inlet is within normal limits. The esophagus is air and fluid filled. At the level of the gastroesophageal junction there is fullness identified likely related to a hiatal hernia although incompletely evaluated on this exam. No significant hilar or mediastinal adenopathy is noted. Lungs/Pleura: Lungs are well aerated bilaterally. Bibasilar atelectatic changes are noted right slightly greater than left. No sizable effusion is seen. Scarring is noted in the right upper lobe laterally stable from the previous CT. Upper Abdomen: No acute abnormality. Musculoskeletal: Compression deformities noted at T6 stable in appearance from the prior chest x-ray from 2018. New T9 compression deformity is noted of uncertain chronicity but new from the prior exam from 2018. Prior  augmentation at L1 is seen. No rib fractures are noted. IMPRESSION: Compression deformities in the thoracic spine which appear chronic although the T9 compression deformity is of uncertain age. If patient's clinical symptomatology persists nonemergent MRI may be helpful. No rib  fractures are seen. Bibasilar atelectatic changes. Aortic Atherosclerosis (ICD10-I70.0). Electronically Signed   By: Inez Catalina M.D.   On: 03/02/2018 12:33    Procedures Procedures (including critical care time)  Medications Ordered in ED Medications  oxyCODONE-acetaminophen (PERCOCET/ROXICET) 5-325 MG per tablet 1 tablet (1 tablet Oral Given 03/02/18 1146)     Initial Impression / Assessment and Plan / ED Course  I have reviewed the triage vital signs and the nursing notes.  Pertinent labs & imaging results that were available during my care of the patient were reviewed by me and considered in my medical decision making (see chart for details).     CT scan shows old compression fractures in the thoracic spine otherwise negative.  Patient does not hurt over her spine she hurts on her chest laterally.  Suspect this is muscle pain.  She is given Percocets and will follow-up with her doctor Final Clinical Impressions(s) / ED Diagnoses   Final diagnoses:  Upper back pain    ED Discharge Orders         Ordered    oxyCODONE-acetaminophen (PERCOCET) 5-325 MG tablet  Every 6 hours PRN     03/02/18 1459           Milton Ferguson, MD 03/04/18 2203

## 2018-03-02 NOTE — Discharge Instructions (Addendum)
Follow-up with your doctor if not improving 

## 2018-03-08 DIAGNOSIS — M4854XA Collapsed vertebra, not elsewhere classified, thoracic region, initial encounter for fracture: Secondary | ICD-10-CM | POA: Diagnosis not present

## 2018-03-16 ENCOUNTER — Other Ambulatory Visit (HOSPITAL_COMMUNITY): Payer: Self-pay | Admitting: Interventional Radiology

## 2018-03-17 ENCOUNTER — Other Ambulatory Visit (HOSPITAL_COMMUNITY): Payer: Self-pay | Admitting: Interventional Radiology

## 2018-03-17 ENCOUNTER — Ambulatory Visit (HOSPITAL_COMMUNITY)
Admission: RE | Admit: 2018-03-17 | Discharge: 2018-03-17 | Disposition: A | Discharge status: Home / Self Care | Payer: Medicare Other | Source: Ambulatory Visit | Attending: Interventional Radiology | Admitting: Interventional Radiology

## 2018-03-17 ENCOUNTER — Other Ambulatory Visit (HOSPITAL_COMMUNITY): Payer: Self-pay | Admitting: Radiology

## 2018-03-17 DIAGNOSIS — S22000A Wedge compression fracture of unspecified thoracic vertebra, initial encounter for closed fracture: Secondary | ICD-10-CM | POA: Diagnosis not present

## 2018-03-17 DIAGNOSIS — S22070A Wedge compression fracture of T9-T10 vertebra, initial encounter for closed fracture: Secondary | ICD-10-CM | POA: Diagnosis not present

## 2018-03-17 DIAGNOSIS — X58XXXA Exposure to other specified factors, initial encounter: Secondary | ICD-10-CM | POA: Insufficient documentation

## 2018-03-17 DIAGNOSIS — S22060A Wedge compression fracture of T7-T8 vertebra, initial encounter for closed fracture: Secondary | ICD-10-CM

## 2018-03-20 ENCOUNTER — Other Ambulatory Visit: Payer: Self-pay | Admitting: Radiology

## 2018-03-21 ENCOUNTER — Ambulatory Visit (HOSPITAL_COMMUNITY)
Admission: RE | Admit: 2018-03-21 | Discharge: 2018-03-21 | Disposition: A | Discharge status: Home / Self Care | Payer: Medicare Other | Source: Ambulatory Visit | Attending: Interventional Radiology | Admitting: Interventional Radiology

## 2018-03-21 ENCOUNTER — Encounter (HOSPITAL_COMMUNITY): Payer: Self-pay

## 2018-03-21 ENCOUNTER — Other Ambulatory Visit: Payer: Self-pay

## 2018-03-21 ENCOUNTER — Telehealth: Payer: Self-pay | Admitting: Student

## 2018-03-21 DIAGNOSIS — S22060A Wedge compression fracture of T7-T8 vertebra, initial encounter for closed fracture: Secondary | ICD-10-CM

## 2018-03-21 DIAGNOSIS — M545 Low back pain: Secondary | ICD-10-CM | POA: Diagnosis not present

## 2018-03-21 DIAGNOSIS — Z538 Procedure and treatment not carried out for other reasons: Secondary | ICD-10-CM | POA: Diagnosis not present

## 2018-03-21 DIAGNOSIS — M4854XA Collapsed vertebra, not elsewhere classified, thoracic region, initial encounter for fracture: Secondary | ICD-10-CM | POA: Diagnosis not present

## 2018-03-21 DIAGNOSIS — R7989 Other specified abnormal findings of blood chemistry: Secondary | ICD-10-CM | POA: Diagnosis not present

## 2018-03-21 DIAGNOSIS — S22070A Wedge compression fracture of T9-T10 vertebra, initial encounter for closed fracture: Secondary | ICD-10-CM

## 2018-03-21 DIAGNOSIS — S22078A Other fracture of T9-T10 vertebra, initial encounter for closed fracture: Secondary | ICD-10-CM | POA: Diagnosis not present

## 2018-03-21 LAB — CBC
HEMATOCRIT: 37.9 % (ref 36.0–46.0)
Hemoglobin: 11.5 g/dL — ABNORMAL LOW (ref 12.0–15.0)
MCH: 27 pg (ref 26.0–34.0)
MCHC: 30.3 g/dL (ref 30.0–36.0)
MCV: 89 fL (ref 80.0–100.0)
NRBC: 0 % (ref 0.0–0.2)
PLATELETS: 387 10*3/uL (ref 150–400)
RBC: 4.26 MIL/uL (ref 3.87–5.11)
RDW: 14 % (ref 11.5–15.5)
WBC: 9 10*3/uL (ref 4.0–10.5)

## 2018-03-21 LAB — BASIC METABOLIC PANEL
Anion gap: 7 (ref 5–15)
BUN: 12 mg/dL (ref 8–23)
CALCIUM: 9.8 mg/dL (ref 8.9–10.3)
CHLORIDE: 104 mmol/L (ref 98–111)
CO2: 27 mmol/L (ref 22–32)
Creatinine, Ser: 1.03 mg/dL — ABNORMAL HIGH (ref 0.44–1.00)
GFR calc non Af Amer: 46 mL/min — ABNORMAL LOW (ref >60)
GFR, EST AFRICAN AMERICAN: 53 mL/min — AB (ref >60)
GLUCOSE: 84 mg/dL (ref 70–99)
Potassium: 3.9 mmol/L (ref 3.5–5.1)
SODIUM: 138 mmol/L (ref 135–145)

## 2018-03-21 LAB — PROTIME-INR
INR: 1
Prothrombin Time: 13.1 seconds (ref 11.4–15.2)

## 2018-03-21 LAB — APTT: APTT: 35 s (ref 24–36)

## 2018-03-21 MED ORDER — OXYCODONE-ACETAMINOPHEN 5-325 MG PO TABS
1.0000 | ORAL_TABLET | Freq: Once | ORAL | Status: AC
  Administered 2018-03-21: 2 via ORAL
  Filled 2018-03-21: qty 2

## 2018-03-21 MED ORDER — VANCOMYCIN HCL IN DEXTROSE 1-5 GM/200ML-% IV SOLN: 1000.0000 mg | Freq: Once | INTRAVENOUS | Status: DC

## 2018-03-21 MED ORDER — SODIUM CHLORIDE 0.9 % IV SOLN
INTRAVENOUS | Status: DC
  Administered 2018-03-21: 10:00:00 via INTRAVENOUS

## 2018-03-21 NOTE — Progress Notes (Signed)
Pt c/o increasing back pain, case has been delayed and is requesting pain med, Jannifer Franklin PA informed, orders followed.

## 2018-03-21 NOTE — Progress Notes (Signed)
Patient was scheduled for an image-guided thoracic 8 and thoracic 9 kyphoplasty/vertebroplasty today with Dr. Estanislado Pandy.  Unfortunately, there were multiple code stroke cases today that required Dr. Arlean Hopping immediate attention. Because of this, patient's procedure had to be postponed.  I spoke with patient and family members to inform them. Patient's procedure has been rescheduled for 03/24/2018.  Patient requested prescription for pain medication. States she ran out of her pain medication from her PCP. Informed patient that she must call her PCP for refill of prescription.  All questions answered and concerns addressed. Patient conveys understanding and agrees with plan.  Katrina Graff Bomani Oommen, PA-C 03/21/2018, 12:34 PM

## 2018-03-21 NOTE — Discharge Instructions (Addendum)
Percutaneous Vertebroplasty, Care After °These instructions give you information on caring for yourself after your procedure. Your doctor may also give you more specific instructions. Call your doctor if you have any problems or questions after your procedure. °Follow these instructions at home: °· Take medicine as told by your doctor. °· Keep your wound dry and covered for 24 hours or as told by your doctor. °· Ask your doctor when you can bathe or shower. °· Put an ice pack on your wound. °? Put ice in a plastic bag. °? Place a towel between your skin and the bag. °? Leave the ice on for 15-20 minutes, 3-4 times a day. °· Rest in your bed for 24 hours or as told by your doctor. °· Return to normal activities as told by your doctor. °· Ask your doctor what stretches and exercises you can do. °· Do not bend or lift anything heavy as told by your doctor. °Contact a doctor if: °· Your wound becomes red, puffy (swollen), or tender to the touch. °· You are bleeding or leaking fluid from the wound. °· You are sick to your stomach (nauseous) or throw up (vomit) for more than 24 hours after the procedure. °· Your back pain does not get better. °· You have a fever. °Get help right away if: °· You have bad back pain that comes on suddenly. °· You cannot control when you pee (urinate) or poop (bowel movement). °· You lose feeling (numbness) or have tingling in your legs or feet, or they become weak. °· You have sudden weakness in your arms or legs. °· You have shooting pain down your legs. °· You have chest pain or a hard time breathing. °· You feel dizzy or pass out (faint). °· Your vision changes or you cannot talk as you normally do. °This information is not intended to replace advice given to you by your health care provider. Make sure you discuss any questions you have with your health care provider. °Document Released: 07/21/2009 Document Revised: 10/02/2015 Document Reviewed: 01/02/2013 °Elsevier Interactive Patient  Education © 2018 [employer] °Balloon Kyphoplasty, Care After °Refer to this sheet in the next few weeks. These instructions provide you with information about caring for yourself after your procedure. Your health care provider may also give you more specific instructions. Your treatment has been planned according to current medical practices, but problems sometimes occur. Call your health care provider if you have any problems or questions after your procedure. °What can I expect after the procedure? °After your procedure, it is common to have back pain. °Follow these instructions at home: °Incision care °· Follow instructions from your health care provider about how to take care of your incisions. Make sure you: °? Wash your hands with soap and water before you change your bandage (dressing). If soap and water are not available, use hand sanitizer. °? Change your dressing as told by your health care provider. °? Leave stitches (sutures), skin glue, or adhesive strips in place. These skin closures may need to be in place for 2 weeks or longer. If adhesive strip edges start to loosen and curl up, you may trim the loose edges. Do not remove adhesive strips completely unless your health care provider tells you to do that. °· Check your incision area every day for signs of infection. Watch for: °? Redness, swelling, or pain. °? Fluid, blood, or pus. °· Keep your dressing dry until your health care provider says that it can be removed. °  Activity ° °· Rest your back and avoid intense physical activity for as long as told by your health care provider. °· Return to your normal activities as told by your health care provider. Ask your health care provider what activities are safe for you. °· Do not lift anything that is heavier than 10 lb (4.5 kg). This is about the weight of a gallon of milk. You may need to avoid heavy lifting for several weeks. °General instructions °· Take over-the-counter and prescription medicines  only as told by your health care provider. °· If directed, apply ice to the painful area: °? Put ice in a plastic bag. °? Place a towel between your skin and the bag. °? Leave the ice on for 20 minutes, 2-3 times per day. °· Do not use tobacco products, including cigarettes, chewing tobacco, or e-cigarettes. If you need help quitting, ask your health care provider. °· Keep all follow-up visits as told by your health care provider. This is important. °Contact a health care provider if: °· You have a fever. °· You have redness, swelling, or pain at the site of your incisions. °· You have fluid, blood, or pus coming from your incisions. °· You have pain that gets worse or does not get better with medicine. °· You develop numbness or weakness in any part of your body. °Get help right away if: °· You have chest pain. °· You have difficulty breathing. °· You cannot move your legs. °· You cannot control your bladder or bowel movements. °· You suddenly become weak or numb on one side of your body. °· You become very confused. °· You have trouble speaking or understanding, or both. °This information is not intended to replace advice given to you by your health care provider. Make sure you discuss any questions you have with your health care provider. °Document Released: 01/15/2015 Document Revised: 10/02/2015 Document Reviewed: 08/19/2014 °Elsevier Interactive Patient Education © 2018 [employer] °Moderate Conscious Sedation, Adult, Care After °These instructions provide you with information about caring for yourself after your procedure. Your health care provider may also give you more specific instructions. Your treatment has been planned according to current medical practices, but problems sometimes occur. Call your health care provider if you have any problems or questions after your procedure. °What can I expect after the procedure? °After your procedure, it is common: °· To feel sleepy for several hours. °· To feel  clumsy and have poor balance for several hours. °· To have poor judgment for several hours. °· To vomit if you eat too soon. ° °Follow these instructions at home: °For at least 24 hours after the procedure: ° °· Do not: °? Participate in activities where you could fall or become injured. °? Drive. °? Use heavy machinery. °? Drink alcohol. °? Take sleeping pills or medicines that cause drowsiness. °? Make important decisions or sign legal documents. °? Take care of children on your own. °· Rest. °Eating and drinking °· Follow the diet recommended by your health care provider. °· If you vomit: °? Drink water, juice, or soup when you can drink without vomiting. °? Make sure you have little or no nausea before eating solid foods. °General instructions °· Have a responsible adult stay with you until you are awake and alert. °· Take over-the-counter and prescription medicines only as told by your health care provider. °· If you smoke, do not smoke without supervision. °· Keep all follow-up visits as told by your health care provider. This is   important. °Contact a health care provider if: °· You keep feeling nauseous or you keep vomiting. °· You feel light-headed. °· You develop a rash. °· You have a fever. °Get help right away if: °· You have trouble breathing. °This information is not intended to replace advice given to you by your health care provider. Make sure you discuss any questions you have with your health care provider. °Document Released: 02/14/2013 Document Revised: 09/29/2015 Document Reviewed: 08/16/2015 °Elsevier Interactive Patient Education © 2018 [employer] ° °

## 2018-03-21 NOTE — H&P (Signed)
Chief Complaint: Patient was seen in consultation today for Thoracic 8/9 Kyphoplasty   Referring Physician(s): Dr Marlowe Aschoff  Supervising Physician: Luanne Bras  Patient Status: [facility] - Out-pt  History of Present Illness: Katrina Burnett is a 84 y.o. female   Felt sudden back pain while working in yard No fall Pain gradually worsening Went to ED 10/24 CT that day:  Compression deformities in the thoracic spine which appear chronic although the T9 compression deformity is of uncertain age. If patient's clinical symptomatology persists nonemergent MRI may be helpful. No rib fractures are seen. Pt s pain at that time was not specific to back-- mostly toward front  Pain worsened over next week MRI 11/8: IMPRESSION: Biconcave compression fracture of T9 is acute or subacute. There is also microfracturing in the inferior endplate of T8 eccentric to the right and a small bone contusion in the anterior margin of T8.  Now scheduled for Kyphoplasty per Dr Estanislado Pandy review Pt has had previous KP Lumbar 1 and Lumbar 2: 2012 and 2013   Past Medical History:  Diagnosis Date  . Allergic rhinitis   . Anxiety   . Arthritis    "hands" (11/25/2016)  . Barrett's esophagus 06/1997  . CAP (community acquired pneumonia) 11/25/2016   "this is my 3rd time having pneumonia" (11/25/2016)  . Chondromalacia   . Chronic lower back pain   . Depression   . Diverticulosis   . GERD (gastroesophageal reflux disease)   . Glaucoma   . History of hiatal hernia   . History of stomach ulcers   . Lumbar compression fracture (HCC)    L1 and L2  . Osteoporosis   . Pneumonia   . Polymyalgia ([place])   . Tubular adenoma of colon 06/1999  . Vaginitis   . Varicose veins     Past Surgical History:  Procedure Laterality Date  . BACK SURGERY    . CATARACT EXTRACTION W/ INTRAOCULAR LENS  IMPLANT, BILATERAL Bilateral   . FIXATION KYPHOPLASTY LUMBAR SPINE  10/12/10; 09/20/11  . FRACTURE SURGERY    .  LUMBAR SPINE SURGERY     "did OR on 2 fractures in my lower back"  . LUNG BIOPSY    . ORIF HIP FRACTURE Right 05/23/2015   Procedure: OPEN REDUCTION INTERNAL FIXATION HIP;  Surgeon: Frederik Pear, MD;  Location: [facility];  Service: Orthopedics;  Laterality: Right;  Marland Kitchen VARICOSE VEIN SURGERY     vein ligation and stripping "years ago"    Allergies: Avelox [moxifloxacin hcl in nacl]; Biaxin [clarithromycin]; Cefuroxime; Levaquin [levofloxacin in d5w]; Minocycline; Paxil [paroxetine hcl]; Prednisone; Remeron [mirtazapine]; Sulfur; Trimox [amoxicillin]; and Viibryd [vilazodone hcl]  Medications: Prior to Admission medications   Medication Sig Start Date End Date Taking? Authorizing Provider  cholecalciferol (VITAMIN D) 1000 UNITS tablet Take 1,000 Units by mouth daily.   Yes [provider]  citalopram (CELEXA) 40 MG tablet Take 40 mg by mouth daily. 09/12/16  Yes [provider]  cyclobenzaprine (FLEXERIL) 10 MG tablet Take 10 mg by mouth 3 (three) times daily as needed for muscle spasms.    Yes [provider]  dorzolamide (TRUSOPT) 2 % ophthalmic solution Place 1 drop into both eyes 2 (two) times daily.    Yes [provider]  esomeprazole (NEXIUM) 40 MG capsule TAKE 1 CAPSULE BY MOUTH EVERY DAY Patient taking differently: Take 40 mg by mouth daily.  02/13/18  Yes Ladene Artist, MD  fluticasone (FLONASE) 50 MCG/ACT nasal spray Place 1 spray into  both nostrils daily as needed for allergies.    Yes [provider]  HYDROcodone-acetaminophen (NORCO) 5-325 MG tablet Take 1 tablet by mouth every 6 (six) hours as needed for moderate pain. 06/04/15  Yes Angiulli, Lavon Paganini, PA-C  ibuprofen (ADVIL,MOTRIN) 200 MG tablet Take 200 mg by mouth every 6 (six) hours as needed for mild pain.   Yes [provider]  LORazepam (ATIVAN) 1 MG tablet Take 1 mg by mouth 2 (two) times daily as needed for anxiety.   Yes [provider]  Multiple Vitamin  (MULTIVITAMIN) tablet Take 1 tablet by mouth daily.   Yes [provider]  traMADol (ULTRAM) 50 MG tablet Take 1 tablet (50 mg total) by mouth every 6 (six) hours as needed for moderate pain. 06/04/15  Yes Angiulli, Lavon Paganini, PA-C  traZODone (DESYREL) 50 MG tablet Take 0.5 tablets (25 mg total) by mouth at bedtime. Patient taking differently: Take 25-50 mg by mouth at bedtime as needed for sleep.  06/04/15  Yes Angiulli, Lavon Paganini, PA-C  cetirizine (ZYRTEC) 10 MG tablet Take 10 mg by mouth daily.    [provider]  glycopyrrolate (ROBINUL) 1 MG tablet Take 1 tablet (1 mg total) by mouth 2 (two) times daily. Patient not taking: Reported on 03/02/2018 07/23/16   Ladene Artist, MD  ketoconazole (NIZORAL) 2 % shampoo Apply 1 application topically daily as needed for irritation.  02/10/18   [provider]  latanoprost (XALATAN) 0.005 % ophthalmic solution Place 1 drop into both eyes at bedtime.    [provider]  oxyCODONE-acetaminophen (PERCOCET) 5-325 MG tablet Take 1-2 tablets by mouth every 6 (six) hours as needed. 03/02/18   Milton Ferguson, MD  Probiotic Product (ALIGN) 4 MG CAPS Take 4 mg by mouth daily.    [provider]     Family History  Problem Relation Age of Onset  . Colon cancer Mother 38  . Congestive Heart Failure Mother   . Heart disease Father   . Alcohol abuse Father   . Heart disease Maternal Grandmother   . Fibromyalgia Son     Social History   Socioeconomic History  . Marital status: Widowed    Spouse name: Not on file  . Number of children: 4  . Years of education: Not on file  . Highest education level: Not on file  Occupational History  . Occupation: Retired  Scientific laboratory technician  . Financial resource strain: Not on file  . Food insecurity:    Worry: Not on file    Inability: Not on file  . Transportation needs:    Medical: Not on file    Non-medical: Not on file  Tobacco Use  . Smoking status: Former Smoker     Years: 1.00  . Smokeless tobacco: Never Used  . Tobacco comment: At age 44 yrs old but nothing since   Substance and Sexual Activity  . Alcohol use: No  . Drug use: No  . Sexual activity: Never    Birth control/protection: Post-menopausal  Lifestyle  . Physical activity:    Days per week: Not on file    Minutes per session: Not on file  . Stress: Not on file  Relationships  . Social connections:    Talks on phone: Not on file    Gets together: Not on file    Attends religious service: Not on file    Active member of club or organization: Not on file    Attends meetings of clubs  or organizations: Not on file    Relationship status: Not on file  Other Topics Concern  . Not on file  Social History Narrative   Daily caffeine     Review of Systems: A 12 point ROS discussed and pertinent positives are indicated in the HPI above.  All other systems are negative.  Review of Systems  Constitutional: Positive for activity change and appetite change. Negative for fatigue.  Respiratory: Negative for cough and shortness of breath.   Cardiovascular: Negative for chest pain.  Gastrointestinal: Negative for nausea and vomiting.  Musculoskeletal: Positive for back pain and gait problem.  Neurological: Negative for weakness.  Psychiatric/Behavioral: Negative for behavioral problems and confusion.    Vital Signs: BP (!) 167/84   Pulse 84   Temp 98.1 F (36.7 C)   Resp 20   Ht 4\' 9"  (1.448 m)   Wt 95 lb (43.1 kg)   SpO2 98%   BMI 20.56 kg/m   Physical Exam  Cardiovascular: Normal rate, regular rhythm and normal heart sounds.  Pulmonary/Chest: Effort normal and breath sounds normal.  Abdominal: Soft. Bowel sounds are normal.  Musculoskeletal: Normal range of motion.  Mid back pain  Neurological: She is alert.  Skin: Skin is warm and dry.  Psychiatric: She has a normal mood and affect. Her behavior is normal. Judgment and thought content normal.  Vitals  reviewed.   Imaging: Ct Chest Wo Contrast  Result Date: 03/02/2018 CLINICAL DATA:  Lateral chest pain following yard work several days ago common no known fall, initial encounter EXAM: CT CHEST WITHOUT CONTRAST TECHNIQUE: Multidetector CT imaging of the chest was performed following the standard protocol without IV contrast. COMPARISON:  12/09/2016 chest x-ray, chest CT from 05/30/2012 FINDINGS: Cardiovascular: Somewhat limited due to lack of IV contrast. Aortic calcifications are seen without aneurysmal dilatation. No cardiac enlargement is noted. No pericardial effusion is seen. Scattered coronary calcifications are noted. Mediastinum/Nodes: Thoracic inlet is within normal limits. The esophagus is air and fluid filled. At the level of the gastroesophageal junction there is fullness identified likely related to a hiatal hernia although incompletely evaluated on this exam. No significant hilar or mediastinal adenopathy is noted. Lungs/Pleura: Lungs are well aerated bilaterally. Bibasilar atelectatic changes are noted right slightly greater than left. No sizable effusion is seen. Scarring is noted in the right upper lobe laterally stable from the previous CT. Upper Abdomen: No acute abnormality. Musculoskeletal: Compression deformities noted at T6 stable in appearance from the prior chest x-ray from 2018. New T9 compression deformity is noted of uncertain chronicity but new from the prior exam from 2018. Prior augmentation at L1 is seen. No rib fractures are noted. IMPRESSION: Compression deformities in the thoracic spine which appear chronic although the T9 compression deformity is of uncertain age. If patient's clinical symptomatology persists nonemergent MRI may be helpful. No rib fractures are seen. Bibasilar atelectatic changes. Aortic Atherosclerosis (ICD10-I70.0). Electronically Signed   By: Inez Catalina M.D.   On: 03/02/2018 12:33   Mr Thoracic Spine Wo Contrast  Result Date: 03/17/2018 CLINICAL  DATA:  Thoracic spine pain for 3 weeks. Age-indeterminate compression fracture on CT chest. EXAM: MRI THORACIC SPINE WITHOUT CONTRAST TECHNIQUE: Multiplanar, multisequence MR imaging of the thoracic spine was performed. No intravenous contrast was administered. COMPARISON:  CT chest 03/02/2018.  PA and lateral chest 12/09/2016. FINDINGS: Alignment: There is some exaggeration of the normal thoracic kyphosis. No listhesis. Convex right thoracolumbar scoliosis noted. Vertebrae: There is marrow edema within the T9 compression fracture seen  on the prior CT consistent with acute or subacute injury. The fracture is biconcave with vertebral body height loss of up to 80% centrally. There is also mild marrow edema in the inferior endplate of T8 eccentric to the right and a more rounded focus of abnormal marrow signal anteriorly in T8. Mild edema extends into the right pedicle and facet but no fracture of the pedicle or facet is identified. Remote T6, L1 and L2 compression fractures are noted. The patient is status post vertebral augmentation at L1 and L2. Cord:  Normal signal throughout. Paraspinal and other soft tissues: Dependent atelectasis is present. The gallbladder appears somewhat distended but otherwise unremarkable. Disc levels: T6-7: There is a shallow broad-based protrusion but no stenosis. T8-9: Mild bony retropulsion off the posterior aspect of T9 is present but the central canal and foramina are open. T12-L1: Shallow broad-based disc bulge without stenosis. Except as described, intervertebral disc spaces are unremarkable. IMPRESSION: Biconcave compression fracture of T9 is acute or subacute. There is also microfracturing in the inferior endplate of T8 eccentric to the right and a small bone contusion in the anterior margin of T8. Remote T6, L1 and L2 compression fractures. Negative for central canal stenosis. Electronically Signed   By: Inge Rise M.D.   On: 03/17/2018 15:09    Labs:  CBC: No results  for input(s): WBC, HGB, HCT, PLT in the last 8760 hours.  COAGS: No results for input(s): INR, APTT in the last 8760 hours.  BMP: No results for input(s): NA, K, CL, CO2, GLUCOSE, BUN, CALCIUM, CREATININE, GFRNONAA, GFRAA in the last 8760 hours.  Invalid input(s): CMP  LIVER FUNCTION TESTS: No results for input(s): BILITOT, AST, ALT, ALKPHOS, PROT, ALBUMIN in the last 8760 hours.  TUMOR MARKERS: No results for input(s): AFPTM, CEA, CA199, CHROMGRNA in the last 8760 hours.  Assessment and Plan:  Worsening back pain Meds without relief MRI revealing acute T8 and T9 fractures Scheduled for Kyphoplasty in IR Risks and benefits of Thoracic 8 and 9 Kyphoplasty were discussed with the patient including, but not limited to education regarding the natural healing process of compression fractures without intervention, bleeding, infection, cement migration which may cause spinal cord damage, paralysis, pulmonary embolism or even death.  This interventional procedure involves the use of X-rays and because of the nature of the planned procedure, it is possible that we will have prolonged use of X-ray fluoroscopy.  Potential radiation risks to you include (but are not limited to) the following: - A slightly elevated risk for cancer  several years later in life. This risk is typically less than 0.5% percent. This risk is low in comparison to the normal incidence of human cancer, which is 33% for women and 50% for men according to the [employer]. - Radiation induced injury can include skin redness, resembling a rash, tissue breakdown / ulcers and hair loss (which can be temporary or permanent).   The likelihood of either of these occurring depends on the difficulty of the procedure and whether you are sensitive to radiation due to previous procedures, disease, or genetic conditions.   IF your procedure requires a prolonged use of radiation, you will be notified and given written  instructions for further action.  It is your responsibility to monitor the irradiated area for the 2 weeks following the procedure and to notify your physician if you are concerned that you have suffered a radiation induced injury.    All of the patient's questions were answered, patient is  agreeable to proceed.  Consent signed and in chart.  Thank you for this interesting consult.  I greatly enjoyed meeting Katrina Burnett and look forward to participating in their care.  A copy of this report was sent to the requesting provider on this date.  Electronically Signed: Lavonia Drafts, PA-C 03/21/2018, 9:58 AM   I spent a total of    40 Minutes in face to face in clinical consultation, greater than 50% of which was counseling/coordinating care for Thoracic 8 and 9 KP

## 2018-03-22 ENCOUNTER — Other Ambulatory Visit: Payer: Self-pay | Admitting: Radiology

## 2018-03-24 ENCOUNTER — Other Ambulatory Visit (HOSPITAL_COMMUNITY): Payer: Self-pay | Admitting: Interventional Radiology

## 2018-03-24 ENCOUNTER — Ambulatory Visit (HOSPITAL_COMMUNITY)
Admission: RE | Admit: 2018-03-24 | Discharge: 2018-03-24 | Disposition: A | Discharge status: Home / Self Care | Payer: Medicare Other | Source: Ambulatory Visit | Attending: Interventional Radiology | Admitting: Interventional Radiology

## 2018-03-24 ENCOUNTER — Encounter (HOSPITAL_COMMUNITY): Payer: Self-pay

## 2018-03-24 DIAGNOSIS — F419 Anxiety disorder, unspecified: Secondary | ICD-10-CM | POA: Diagnosis not present

## 2018-03-24 DIAGNOSIS — Z882 Allergy status to sulfonamides status: Secondary | ICD-10-CM | POA: Diagnosis not present

## 2018-03-24 DIAGNOSIS — M353 Polymyalgia rheumatica: Secondary | ICD-10-CM | POA: Insufficient documentation

## 2018-03-24 DIAGNOSIS — S22060A Wedge compression fracture of T7-T8 vertebra, initial encounter for closed fracture: Secondary | ICD-10-CM

## 2018-03-24 DIAGNOSIS — Z880 Allergy status to penicillin: Secondary | ICD-10-CM | POA: Diagnosis not present

## 2018-03-24 DIAGNOSIS — M810 Age-related osteoporosis without current pathological fracture: Secondary | ICD-10-CM | POA: Diagnosis not present

## 2018-03-24 DIAGNOSIS — M4854XA Collapsed vertebra, not elsewhere classified, thoracic region, initial encounter for fracture: Secondary | ICD-10-CM | POA: Insufficient documentation

## 2018-03-24 DIAGNOSIS — Z791 Long term (current) use of non-steroidal anti-inflammatories (NSAID): Secondary | ICD-10-CM | POA: Diagnosis not present

## 2018-03-24 DIAGNOSIS — Z87891 Personal history of nicotine dependence: Secondary | ICD-10-CM | POA: Diagnosis not present

## 2018-03-24 DIAGNOSIS — Z7951 Long term (current) use of inhaled steroids: Secondary | ICD-10-CM | POA: Insufficient documentation

## 2018-03-24 DIAGNOSIS — K219 Gastro-esophageal reflux disease without esophagitis: Secondary | ICD-10-CM | POA: Insufficient documentation

## 2018-03-24 DIAGNOSIS — F329 Major depressive disorder, single episode, unspecified: Secondary | ICD-10-CM | POA: Diagnosis not present

## 2018-03-24 DIAGNOSIS — H409 Unspecified glaucoma: Secondary | ICD-10-CM | POA: Diagnosis not present

## 2018-03-24 DIAGNOSIS — G8929 Other chronic pain: Secondary | ICD-10-CM | POA: Diagnosis not present

## 2018-03-24 DIAGNOSIS — S22078A Other fracture of T9-T10 vertebra, initial encounter for closed fracture: Secondary | ICD-10-CM | POA: Diagnosis not present

## 2018-03-24 DIAGNOSIS — S22070A Wedge compression fracture of T9-T10 vertebra, initial encounter for closed fracture: Secondary | ICD-10-CM

## 2018-03-24 HISTORY — PX: IR VERTEBROPLASTY CERV/THOR BX INC UNI/BIL INC/INJECT/IMAGING: IMG5515

## 2018-03-24 HISTORY — PX: IR KYPHO THORACIC WITH BONE BIOPSY: IMG5518

## 2018-03-24 MED ORDER — MIDAZOLAM HCL 2 MG/2ML IJ SOLN
INTRAMUSCULAR | Status: AC
  Filled 2018-03-24: qty 2

## 2018-03-24 MED ORDER — GELATIN ABSORBABLE 12-7 MM EX MISC
CUTANEOUS | Status: AC
  Filled 2018-03-24: qty 1

## 2018-03-24 MED ORDER — SODIUM CHLORIDE 0.9 % IV SOLN
INTRAVENOUS | Status: DC
  Administered 2018-03-24: 10:00:00 via INTRAVENOUS

## 2018-03-24 MED ORDER — FENTANYL CITRATE (PF) 100 MCG/2ML IJ SOLN
INTRAMUSCULAR | Status: AC | PRN
  Administered 2018-03-24: 25 ug via INTRAVENOUS
  Administered 2018-03-24: 12.5 ug via INTRAVENOUS

## 2018-03-24 MED ORDER — SODIUM CHLORIDE 0.9 % IV SOLN: INTRAVENOUS | Status: AC

## 2018-03-24 MED ORDER — SODIUM CHLORIDE 0.9 % IV SOLN
INTRAVENOUS | Status: AC | PRN
  Administered 2018-03-24: 10 mL/h via INTRAVENOUS

## 2018-03-24 MED ORDER — VANCOMYCIN HCL IN DEXTROSE 1-5 GM/200ML-% IV SOLN
INTRAVENOUS | Status: AC
  Filled 2018-03-24: qty 200

## 2018-03-24 MED ORDER — FENTANYL CITRATE (PF) 100 MCG/2ML IJ SOLN
INTRAMUSCULAR | Status: AC
  Filled 2018-03-24: qty 2

## 2018-03-24 MED ORDER — VANCOMYCIN HCL IN DEXTROSE 1-5 GM/200ML-% IV SOLN
INTRAVENOUS | Status: AC | PRN
  Administered 2018-03-24: 1000 mg via INTRAVENOUS

## 2018-03-24 MED ORDER — TOBRAMYCIN SULFATE 1.2 G IJ SOLR
INTRAMUSCULAR | Status: AC
  Filled 2018-03-24: qty 1.2

## 2018-03-24 MED ORDER — IOPAMIDOL (ISOVUE-300) INJECTION 61%
INTRAVENOUS | Status: AC
  Filled 2018-03-24: qty 50

## 2018-03-24 MED ORDER — VANCOMYCIN HCL IN DEXTROSE 1-5 GM/200ML-% IV SOLN: 1000.0000 mg | INTRAVENOUS | Status: DC

## 2018-03-24 MED ORDER — MIDAZOLAM HCL 2 MG/2ML IJ SOLN
INTRAMUSCULAR | Status: AC | PRN
  Administered 2018-03-24: 1 mg via INTRAVENOUS

## 2018-03-24 MED ORDER — BUPIVACAINE HCL (PF) 0.5 % IJ SOLN
INTRAMUSCULAR | Status: AC
  Filled 2018-03-24: qty 30

## 2018-03-24 MED ORDER — HYDROMORPHONE HCL 1 MG/ML IJ SOLN
INTRAMUSCULAR | Status: AC
  Filled 2018-03-24: qty 1

## 2018-03-24 NOTE — H&P (Addendum)
Chief Complaint: Patient was seen in consultation today for T8/T9 kyphoplasty  Referring Physician(s): Dr. Dewayne Hatch  Supervising Physician: Luanne Bras  Patient Status: [facility] - Out-pt  History of Present Illness: Katrina Burnett is a 84 y.o. female with a past medical history significant for anxiety, arthritis, barrett's esophagus, chrondomalacia, chronic low back pain with previously treated L1/L2 compression fractures (2012 and 2013 with Dr. Estanislado Pandy), osteoperosis, diverticulosis, depression, GERD, glaucoma. She originally presented for kyphoplasty on 11/12 unfortunately the procedure had to be postponed until today due to several emergencies in NIR. She states began having pain in her back and chest which started suddenly while working in her yard, she denies any falls or other trauma. She presented to the ED on 10/24 due to progressively worsening back pain - CT chest w/o contrast was performed and showed compression deformities in the thoracic spine which appear chronic although the T9 compression deformity is of uncertain age. Follow-up MR thoracic spine was performed on 11/8 which showed biconcave compression fracture of T9 is acute or subacute. There is also microfracturing in the inferior endplate of T8 eccentric to the right and a small bone contusion in the anterior margin of T8; remote T6, L1, and L2 compression fractures. She was subsequently scheduled for vertebroplasty/kyphoplasty of T8 and T9 for pain management.  She denies any complaints today except for mid to low back pain which makes it difficult for her to walk, sit on hard surfaces, bend over and do chores. She states she is normally very active and has been unable to perform ADLs at baseline. She is requesting refill of narcotic pain medication as well stating she has taken her last pill this morning, she normally obtains her narcotic prescription from her PCP.   Past Medical History:  Diagnosis Date  . Allergic  rhinitis   . Anxiety   . Arthritis    "hands" (11/25/2016)  . Barrett's esophagus 06/1997  . CAP (community acquired pneumonia) 11/25/2016   "this is my 3rd time having pneumonia" (11/25/2016)  . Chondromalacia   . Chronic lower back pain   . Depression   . Diverticulosis   . GERD (gastroesophageal reflux disease)   . Glaucoma   . History of hiatal hernia   . History of stomach ulcers   . Lumbar compression fracture (HCC)    L1 and L2  . Osteoporosis   . Pneumonia   . Polymyalgia ([place])   . Tubular adenoma of colon 06/1999  . Vaginitis   . Varicose veins     Past Surgical History:  Procedure Laterality Date  . BACK SURGERY    . CATARACT EXTRACTION W/ INTRAOCULAR LENS  IMPLANT, BILATERAL Bilateral   . FIXATION KYPHOPLASTY LUMBAR SPINE  10/12/10; 09/20/11  . FRACTURE SURGERY    . LUMBAR SPINE SURGERY     "did OR on 2 fractures in my lower back"  . LUNG BIOPSY    . ORIF HIP FRACTURE Right 05/23/2015   Procedure: OPEN REDUCTION INTERNAL FIXATION HIP;  Surgeon: Frederik Pear, MD;  Location: [place];  Service: Orthopedics;  Laterality: Right;  Marland Kitchen VARICOSE VEIN SURGERY     vein ligation and stripping "years ago"    Allergies: Avelox [moxifloxacin hcl in nacl]; Biaxin [clarithromycin]; Cefuroxime; Levaquin [levofloxacin in d5w]; Minocycline; Paxil [paroxetine hcl]; Prednisone; Remeron [mirtazapine]; Sulfur; Trimox [amoxicillin]; and Viibryd [vilazodone hcl]  Medications: Prior to Admission medications   Medication Sig Start Date End Date Taking? Authorizing Provider  cetirizine (ZYRTEC) 10 MG tablet Take 10 mg  by mouth daily.   Yes [provider]  cholecalciferol (VITAMIN D) 1000 UNITS tablet Take 1,000 Units by mouth daily.   Yes [provider]  citalopram (CELEXA) 40 MG tablet Take 40 mg by mouth daily. 09/12/16  Yes [provider]  cyclobenzaprine (FLEXERIL) 10 MG tablet Take 10 mg by mouth 3 (three) times daily as needed for muscle spasms.    Yes [provider]  dorzolamide (TRUSOPT) 2 % ophthalmic solution Place 1 drop into both eyes 2 (two) times daily.    Yes [provider]  esomeprazole (NEXIUM) 40 MG capsule TAKE 1 CAPSULE BY MOUTH EVERY DAY Patient taking differently: Take 40 mg by mouth daily.  02/13/18  Yes Ladene Artist, MD  fluticasone (FLONASE) 50 MCG/ACT nasal spray Place 1 spray into both nostrils daily as needed for allergies.    Yes [provider]  HYDROcodone-acetaminophen (NORCO) 5-325 MG tablet Take 1 tablet by mouth every 6 (six) hours as needed for moderate pain. 06/04/15  Yes Angiulli, Lavon Paganini, PA-C  ibuprofen (ADVIL,MOTRIN) 200 MG tablet Take 200 mg by mouth every 6 (six) hours as needed for mild pain.   Yes [provider]  latanoprost (XALATAN) 0.005 % ophthalmic solution Place 1 drop into both eyes at bedtime.   Yes [provider]  Multiple Vitamin (MULTIVITAMIN) tablet Take 1 tablet by mouth daily.   Yes [provider]  Probiotic Product (ALIGN) 4 MG CAPS Take 4 mg by mouth daily.   Yes [provider]  traZODone (DESYREL) 50 MG tablet Take 0.5 tablets (25 mg total) by mouth at bedtime. Patient taking differently: Take 25-50 mg by mouth at bedtime as needed for sleep.  06/04/15  Yes Angiulli, Lavon Paganini, PA-C  glycopyrrolate (ROBINUL) 1 MG tablet Take 1 tablet (1 mg total) by mouth 2 (two) times daily. Patient not taking: Reported on 03/02/2018 07/23/16   Ladene Artist, MD  ketoconazole (NIZORAL) 2 % shampoo Apply 1 application topically daily as needed for irritation.  02/10/18   [provider]  LORazepam (ATIVAN) 1 MG tablet Take 1 mg by mouth 2 (two) times daily as needed for anxiety.    [provider]  oxyCODONE-acetaminophen (PERCOCET) 5-325 MG tablet Take 1-2 tablets by mouth every 6 (six) hours as needed. 03/02/18   Milton Ferguson, MD  traMADol (ULTRAM) 50 MG tablet Take 1 tablet (50 mg total) by mouth every 6 (six) hours as needed  for moderate pain. 06/04/15   Angiulli, Lavon Paganini, PA-C     Family History  Problem Relation Age of Onset  . Colon cancer Mother 67  . Congestive Heart Failure Mother   . Heart disease Father   . Alcohol abuse Father   . Heart disease Maternal Grandmother   . Fibromyalgia Son     Social History   Socioeconomic History  . Marital status: Widowed    Spouse name: Not on file  . Number of children: 4  . Years of education: Not on file  . Highest education level: Not on file  Occupational History  . Occupation: Retired  Scientific laboratory technician  . Financial resource strain: Not on file  . Food insecurity:    Worry: Not on file    Inability: Not on file  . Transportation needs:    Medical: Not on file    Non-medical: Not on file  Tobacco Use  . Smoking status: Former Smoker    Years: 1.00  . Smokeless tobacco: Never Used  .  Tobacco comment: At age 48 yrs old but nothing since   Substance and Sexual Activity  . Alcohol use: No  . Drug use: No  . Sexual activity: Never    Birth control/protection: Post-menopausal  Lifestyle  . Physical activity:    Days per week: Not on file    Minutes per session: Not on file  . Stress: Not on file  Relationships  . Social connections:    Talks on phone: Not on file    Gets together: Not on file    Attends religious service: Not on file    Active member of club or organization: Not on file    Attends meetings of clubs or organizations: Not on file    Relationship status: Not on file  Other Topics Concern  . Not on file  Social History Narrative   Daily caffeine      Review of Systems: A 12 point ROS discussed and pertinent positives are indicated in the HPI above.  All other systems are negative.  Review of Systems  Constitutional: Positive for activity change (decreased due to pain). Negative for chills and fever.  Respiratory: Negative for cough and shortness of breath.   Cardiovascular: Negative for chest pain.  Gastrointestinal:  Negative for abdominal pain, diarrhea, nausea and vomiting.  Genitourinary: Negative for difficulty urinating, dysuria, flank pain and hematuria.  Musculoskeletal: Positive for back pain.  Skin: Negative for rash.  Neurological: Negative for dizziness, syncope and headaches.  Psychiatric/Behavioral: Negative for confusion.    Vital Signs: BP (!) 141/85   Pulse 87   Temp 98.1 F (36.7 C) (Oral)   Ht 4\' 9"  (1.448 m)   Wt 95 lb (43.1 kg)   SpO2 98%   BMI 20.56 kg/m   Physical Exam  Constitutional: She is oriented to person, place, and time. No distress.  HENT:  Head: Normocephalic.  Cardiovascular: Normal rate, regular rhythm and normal heart sounds.  Pulmonary/Chest: Effort normal and breath sounds normal.  Abdominal: Soft. She exhibits no distension. There is no tenderness.  Musculoskeletal:  Difficulty turning in bed and sitting during exam, mild tenderness to palpation of lumber and low thoracic spine.   Neurological: She is alert and oriented to person, place, and time.  Skin: Skin is warm and dry. She is not diaphoretic.  Psychiatric: She has a normal mood and affect. Her behavior is normal. Judgment and thought content normal.  Vitals reviewed.    MD Evaluation Airway: WNL Heart: WNL Abdomen: WNL Chest/ Lungs: WNL ASA  Classification: 3 Mallampati/Airway Score: One   Imaging: Ct Chest Wo Contrast  Result Date: 03/02/2018 CLINICAL DATA:  Lateral chest pain following yard work several days ago common no known fall, initial encounter EXAM: CT CHEST WITHOUT CONTRAST TECHNIQUE: Multidetector CT imaging of the chest was performed following the standard protocol without IV contrast. COMPARISON:  12/09/2016 chest x-ray, chest CT from 05/30/2012 FINDINGS: Cardiovascular: Somewhat limited due to lack of IV contrast. Aortic calcifications are seen without aneurysmal dilatation. No cardiac enlargement is noted. No pericardial effusion is seen. Scattered coronary calcifications  are noted. Mediastinum/Nodes: Thoracic inlet is within normal limits. The esophagus is air and fluid filled. At the level of the gastroesophageal junction there is fullness identified likely related to a hiatal hernia although incompletely evaluated on this exam. No significant hilar or mediastinal adenopathy is noted. Lungs/Pleura: Lungs are well aerated bilaterally. Bibasilar atelectatic changes are noted right slightly greater than left. No sizable effusion is seen. Scarring is noted in the  right upper lobe laterally stable from the previous CT. Upper Abdomen: No acute abnormality. Musculoskeletal: Compression deformities noted at T6 stable in appearance from the prior chest x-ray from 2018. New T9 compression deformity is noted of uncertain chronicity but new from the prior exam from 2018. Prior augmentation at L1 is seen. No rib fractures are noted. IMPRESSION: Compression deformities in the thoracic spine which appear chronic although the T9 compression deformity is of uncertain age. If patient's clinical symptomatology persists nonemergent MRI may be helpful. No rib fractures are seen. Bibasilar atelectatic changes. Aortic Atherosclerosis (ICD10-I70.0). Electronically Signed   By: Inez Catalina M.D.   On: 03/02/2018 12:33   Mr Thoracic Spine Wo Contrast  Result Date: 03/17/2018 CLINICAL DATA:  Thoracic spine pain for 3 weeks. Age-indeterminate compression fracture on CT chest. EXAM: MRI THORACIC SPINE WITHOUT CONTRAST TECHNIQUE: Multiplanar, multisequence MR imaging of the thoracic spine was performed. No intravenous contrast was administered. COMPARISON:  CT chest 03/02/2018.  PA and lateral chest 12/09/2016. FINDINGS: Alignment: There is some exaggeration of the normal thoracic kyphosis. No listhesis. Convex right thoracolumbar scoliosis noted. Vertebrae: There is marrow edema within the T9 compression fracture seen on the prior CT consistent with acute or subacute injury. The fracture is biconcave with  vertebral body height loss of up to 80% centrally. There is also mild marrow edema in the inferior endplate of T8 eccentric to the right and a more rounded focus of abnormal marrow signal anteriorly in T8. Mild edema extends into the right pedicle and facet but no fracture of the pedicle or facet is identified. Remote T6, L1 and L2 compression fractures are noted. The patient is status post vertebral augmentation at L1 and L2. Cord:  Normal signal throughout. Paraspinal and other soft tissues: Dependent atelectasis is present. The gallbladder appears somewhat distended but otherwise unremarkable. Disc levels: T6-7: There is a shallow broad-based protrusion but no stenosis. T8-9: Mild bony retropulsion off the posterior aspect of T9 is present but the central canal and foramina are open. T12-L1: Shallow broad-based disc bulge without stenosis. Except as described, intervertebral disc spaces are unremarkable. IMPRESSION: Biconcave compression fracture of T9 is acute or subacute. There is also microfracturing in the inferior endplate of T8 eccentric to the right and a small bone contusion in the anterior margin of T8. Remote T6, L1 and L2 compression fractures. Negative for central canal stenosis. Electronically Signed   By: Inge Rise M.D.   On: 03/17/2018 15:09    Labs:  CBC: Recent Labs    03/21/18 0932  WBC 9.0  HGB 11.5*  HCT 37.9  PLT 387    COAGS: Recent Labs    03/21/18 0932  INR 1.00  APTT 35    BMP: Recent Labs    03/21/18 0932  NA 138  K 3.9  CL 104  CO2 27  GLUCOSE 84  BUN 12  CALCIUM 9.8  CREATININE 1.03*  GFRNONAA 46*  GFRAA 53*    LIVER FUNCTION TESTS: No results for input(s): BILITOT, AST, ALT, ALKPHOS, PROT, ALBUMIN in the last 8760 hours.  TUMOR MARKERS: No results for input(s): AFPTM, CEA, CA199, CHROMGRNA in the last 8760 hours.  Assessment and Plan:  Compression fracture T8/T9 noted on MR thoracic spine from 11/8 - no fall or obvious trauma per  patient. Using Percocet or Norco for pain control which helps some, patient and daughter are requesting pain medication refill as she took her last pill this morning and will be out for the weekend. Discussed  that this is not normally done by our service and they should call PCP for refill - I will also discuss this with Dr. Estanislado Pandy.   Patient is afebrile, denies any urinary symptoms, she does not take blood thinning medications, WBC 9.0, INR 1.00.  Risks and benefits of T8/T9 kyphoplasty/vertebroplasty were discussed with the patient including, but not limited to education regarding the natural healing process of compression fractures without intervention, bleeding, infection, cement migration which may cause spinal cord damage, paralysis, pulmonary embolism or even death.  This interventional procedure involves the use of X-rays and because of the nature of the planned procedure, it is possible that we will have prolonged use of X-ray fluoroscopy. Potential radiation risks to you include (but are not limited to) the following: - A slightly elevated risk for cancer  several years later in life. This risk is typically less than 0.5% percent. This risk is low in comparison to the normal incidence of human cancer, which is 33% for women and 50% for men according to the [employer]. - Radiation induced injury can include skin redness, resembling a rash, tissue breakdown / ulcers and hair loss (which can be temporary or permanent).  The likelihood of either of these occurring depends on the difficulty of the procedure and whether you are sensitive to radiation due to previous procedures, disease, or genetic conditions.  IF your procedure requires a prolonged use of radiation, you will be notified and given written instructions for further action.  It is your responsibility to monitor the irradiated area for the 2 weeks following the procedure and to notify your physician if you are concerned  that you have suffered a radiation induced injury.    All of the patient's questions were answered, patient is agreeable to proceed.  Consent signed and in chart.  Thank you for this interesting consult.  I greatly enjoyed meeting BOWIE DOIRON and look forward to participating in their care.  A copy of this report was sent to the requesting provider on this date.  Electronically Signed: Joaquim Nam, PA-C 03/24/2018, 10:28 AM   I spent a total of  25 Minutes in face to face in clinical consultation, greater than 50% of which was counseling/coordinating care for kyphoplasty.

## 2018-03-24 NOTE — Discharge Instructions (Signed)
Moderate Conscious Sedation, Adult, Care After These instructions provide you with information about caring for yourself after your procedure. Your health care provider may also give you more specific instructions. Your treatment has been planned according to current medical practices, but problems sometimes occur. Call your health care provider if you have any problems or questions after your procedure. What can I expect after the procedure? After your procedure, it is common:  To feel sleepy for several hours.  To feel clumsy and have poor balance for several hours.  To have poor judgment for several hours.  To vomit if you eat too soon.  Follow these instructions at home: For at least 24 hours after the procedure:   Do not: ? Participate in activities where you could fall or become injured. ? Drive. ? Use heavy machinery. ? Drink alcohol. ? Take sleeping pills or medicines that cause drowsiness. ? Make important decisions or sign legal documents. ? Take care of children on your own.  Rest. Eating and drinking  Follow the diet recommended by your health care provider.  If you vomit: ? Drink water, juice, or soup when you can drink without vomiting. ? Make sure you have little or no nausea before eating solid foods. General instructions  Have a responsible adult stay with you until you are awake and alert.  Take over-the-counter and prescription medicines only as told by your health care provider.  If you smoke, do not smoke without supervision.  Keep all follow-up visits as told by your health care provider. This is important. Contact a health care provider if:  You keep feeling nauseous or you keep vomiting.  You feel light-headed.  You develop a rash.  You have a fever. Get help right away if:  You have trouble breathing. This information is not intended to replace advice given to you by your health care provider. Make sure you discuss any questions you have  with your health care provider. Document Released: 02/14/2013 Document Revised: 09/29/2015 Document Reviewed: 08/16/2015 Elsevier Interactive Patient Education  2018 [place] INSTRUCTIONS  Medications: (check all that apply)     Resume all home medications as before procedure.                      Continue your pain medications as prescribed as needed.  Over the next 3-5 days, decrease your pain medication as tolerated.  Over the counter medications (i.e. Tylenol, ibuprofen, and aleve) may be substituted once severe/moderate pain symptoms have subsided.   Wound Care: - Bandages may be removed the day following your procedure.  You may get your incision wet once bandages are removed.  Bandaids may be used to cover the incisions until scab formation.  Topical ointments are optional.  - If you develop a fever greater than 101 degrees, have increased skin redness at the incision sites or pus-like oozing from incisions occurring within 1 week of the procedure, contact radiology at [phone] or [phone].  - Ice pack to back for 15-20 minutes 2-3 time per day for first 2-3 days post procedure.  The ice will expedite muscle healing and help with the pain from the incisions.   Activity: - Bedrest today with limited activity for 24 hours post procedure.  - No driving for 48 hours.  - Increase your activity as tolerated after bedrest (with assistance if necessary).  - Refrain from any strenuous activity or heavy lifting (greater than 10 lbs.).   Follow up: -  Contact radiology at [phone] or [phone] if any questions/concerns.  - A physician assistant from radiology will contact you in approximately 1 week.  - If a biopsy was performed at the time of your procedure, your referring physician should receive the results in usually 2-3 days.         1. No stooping,bending or lifting more than 10 lbs for 2 weeks. 2.Use walker to ambulate for 2  weeks. 3.RTC 2 weeks PRN

## 2018-03-24 NOTE — Procedures (Signed)
S/P T 8 KP and T9 VP

## 2018-03-24 NOTE — Sedation Documentation (Signed)
Pt is resting at this time. Procedure finished

## 2018-03-24 NOTE — Sedation Documentation (Signed)
Report called to Herold Harms RN

## 2018-04-19 DIAGNOSIS — K219 Gastro-esophageal reflux disease without esophagitis: Secondary | ICD-10-CM | POA: Diagnosis not present

## 2018-04-19 DIAGNOSIS — Z79899 Other long term (current) drug therapy: Secondary | ICD-10-CM | POA: Diagnosis not present

## 2018-04-19 DIAGNOSIS — F419 Anxiety disorder, unspecified: Secondary | ICD-10-CM | POA: Diagnosis not present

## 2018-04-19 DIAGNOSIS — G8929 Other chronic pain: Secondary | ICD-10-CM | POA: Diagnosis not present

## 2018-04-19 DIAGNOSIS — G4700 Insomnia, unspecified: Secondary | ICD-10-CM | POA: Diagnosis not present

## 2018-04-19 DIAGNOSIS — F329 Major depressive disorder, single episode, unspecified: Secondary | ICD-10-CM | POA: Diagnosis not present

## 2018-04-21 ENCOUNTER — Encounter (HOSPITAL_COMMUNITY): Payer: Self-pay | Admitting: Interventional Radiology

## 2018-07-03 DIAGNOSIS — M5136 Other intervertebral disc degeneration, lumbar region: Secondary | ICD-10-CM | POA: Diagnosis not present

## 2018-07-03 DIAGNOSIS — F419 Anxiety disorder, unspecified: Secondary | ICD-10-CM | POA: Diagnosis not present

## 2018-07-03 DIAGNOSIS — M5030 Other cervical disc degeneration, unspecified cervical region: Secondary | ICD-10-CM | POA: Diagnosis not present

## 2018-07-12 DIAGNOSIS — E7800 Pure hypercholesterolemia, unspecified: Secondary | ICD-10-CM | POA: Diagnosis not present

## 2018-07-12 DIAGNOSIS — Z5181 Encounter for therapeutic drug level monitoring: Secondary | ICD-10-CM | POA: Diagnosis not present

## 2018-07-20 DIAGNOSIS — F329 Major depressive disorder, single episode, unspecified: Secondary | ICD-10-CM | POA: Diagnosis not present

## 2018-08-14 ENCOUNTER — Telehealth: Payer: Self-pay | Admitting: Gastroenterology

## 2018-08-14 MED ORDER — ESOMEPRAZOLE MAGNESIUM 40 MG PO CPDR: DELAYED_RELEASE_CAPSULE | ORAL | 5 refills | Status: DC

## 2018-08-14 NOTE — Telephone Encounter (Signed)
Prescription sent to patient's pharmacy.

## 2018-09-04 ENCOUNTER — Other Ambulatory Visit: Payer: Self-pay | Admitting: Internal Medicine

## 2018-09-04 DIAGNOSIS — Z1231 Encounter for screening mammogram for malignant neoplasm of breast: Secondary | ICD-10-CM

## 2018-09-21 DIAGNOSIS — Z7189 Other specified counseling: Secondary | ICD-10-CM | POA: Diagnosis not present

## 2018-09-21 DIAGNOSIS — Z658 Other specified problems related to psychosocial circumstances: Secondary | ICD-10-CM | POA: Diagnosis not present

## 2018-09-21 DIAGNOSIS — F4321 Adjustment disorder with depressed mood: Secondary | ICD-10-CM | POA: Diagnosis not present

## 2018-09-21 DIAGNOSIS — M5030 Other cervical disc degeneration, unspecified cervical region: Secondary | ICD-10-CM | POA: Diagnosis not present

## 2018-09-21 DIAGNOSIS — R6889 Other general symptoms and signs: Secondary | ICD-10-CM | POA: Diagnosis not present

## 2018-09-21 DIAGNOSIS — R300 Dysuria: Secondary | ICD-10-CM | POA: Diagnosis not present

## 2018-09-21 DIAGNOSIS — M1990 Unspecified osteoarthritis, unspecified site: Secondary | ICD-10-CM | POA: Diagnosis not present

## 2018-10-19 DIAGNOSIS — Z0000 Encounter for general adult medical examination without abnormal findings: Secondary | ICD-10-CM | POA: Diagnosis not present

## 2018-10-19 DIAGNOSIS — F329 Major depressive disorder, single episode, unspecified: Secondary | ICD-10-CM | POA: Diagnosis not present

## 2018-10-24 ENCOUNTER — Inpatient Hospital Stay (HOSPITAL_COMMUNITY)
Admission: EM | Admit: 2018-10-24 | Discharge: 2018-10-28 | DRG: 200 | Disposition: A | Discharge status: Home Health | Payer: Medicare Other | Attending: General Surgery | Admitting: General Surgery

## 2018-10-24 ENCOUNTER — Encounter (HOSPITAL_COMMUNITY): Payer: Self-pay | Admitting: Radiology

## 2018-10-24 ENCOUNTER — Emergency Department (HOSPITAL_COMMUNITY): Payer: Medicare Other

## 2018-10-24 DIAGNOSIS — Z961 Presence of intraocular lens: Secondary | ICD-10-CM | POA: Diagnosis present

## 2018-10-24 DIAGNOSIS — M542 Cervicalgia: Secondary | ICD-10-CM | POA: Diagnosis present

## 2018-10-24 DIAGNOSIS — D649 Anemia, unspecified: Secondary | ICD-10-CM | POA: Diagnosis not present

## 2018-10-24 DIAGNOSIS — M353 Polymyalgia rheumatica: Secondary | ICD-10-CM | POA: Diagnosis not present

## 2018-10-24 DIAGNOSIS — K5790 Diverticulosis of intestine, part unspecified, without perforation or abscess without bleeding: Secondary | ICD-10-CM | POA: Diagnosis present

## 2018-10-24 DIAGNOSIS — Z8711 Personal history of peptic ulcer disease: Secondary | ICD-10-CM | POA: Diagnosis not present

## 2018-10-24 DIAGNOSIS — G8929 Other chronic pain: Secondary | ICD-10-CM | POA: Diagnosis not present

## 2018-10-24 DIAGNOSIS — Z4682 Encounter for fitting and adjustment of non-vascular catheter: Secondary | ICD-10-CM | POA: Diagnosis not present

## 2018-10-24 DIAGNOSIS — M810 Age-related osteoporosis without current pathological fracture: Secondary | ICD-10-CM | POA: Diagnosis not present

## 2018-10-24 DIAGNOSIS — J9383 Other pneumothorax: Secondary | ICD-10-CM | POA: Diagnosis not present

## 2018-10-24 DIAGNOSIS — G2581 Restless legs syndrome: Secondary | ICD-10-CM | POA: Diagnosis present

## 2018-10-24 DIAGNOSIS — S2242XA Multiple fractures of ribs, left side, initial encounter for closed fracture: Secondary | ICD-10-CM

## 2018-10-24 DIAGNOSIS — M19042 Primary osteoarthritis, left hand: Secondary | ICD-10-CM | POA: Diagnosis not present

## 2018-10-24 DIAGNOSIS — J90 Pleural effusion, not elsewhere classified: Secondary | ICD-10-CM | POA: Diagnosis not present

## 2018-10-24 DIAGNOSIS — W19XXXA Unspecified fall, initial encounter: Secondary | ICD-10-CM | POA: Diagnosis not present

## 2018-10-24 DIAGNOSIS — M19041 Primary osteoarthritis, right hand: Secondary | ICD-10-CM | POA: Diagnosis present

## 2018-10-24 DIAGNOSIS — R52 Pain, unspecified: Secondary | ICD-10-CM | POA: Diagnosis not present

## 2018-10-24 DIAGNOSIS — W010XXA Fall on same level from slipping, tripping and stumbling without subsequent striking against object, initial encounter: Secondary | ICD-10-CM | POA: Diagnosis present

## 2018-10-24 DIAGNOSIS — Z800 Family history of malignant neoplasm of digestive organs: Secondary | ICD-10-CM

## 2018-10-24 DIAGNOSIS — Y92009 Unspecified place in unspecified non-institutional (private) residence as the place of occurrence of the external cause: Secondary | ICD-10-CM | POA: Diagnosis not present

## 2018-10-24 DIAGNOSIS — H409 Unspecified glaucoma: Secondary | ICD-10-CM | POA: Diagnosis present

## 2018-10-24 DIAGNOSIS — Z20828 Contact with and (suspected) exposure to other viral communicable diseases: Secondary | ICD-10-CM | POA: Diagnosis not present

## 2018-10-24 DIAGNOSIS — Z9842 Cataract extraction status, left eye: Secondary | ICD-10-CM | POA: Diagnosis not present

## 2018-10-24 DIAGNOSIS — J939 Pneumothorax, unspecified: Secondary | ICD-10-CM | POA: Diagnosis not present

## 2018-10-24 DIAGNOSIS — Z881 Allergy status to other antibiotic agents status: Secondary | ICD-10-CM

## 2018-10-24 DIAGNOSIS — S80811A Abrasion, right lower leg, initial encounter: Secondary | ICD-10-CM | POA: Diagnosis present

## 2018-10-24 DIAGNOSIS — S270XXA Traumatic pneumothorax, initial encounter: Secondary | ICD-10-CM | POA: Diagnosis not present

## 2018-10-24 DIAGNOSIS — Z8701 Personal history of pneumonia (recurrent): Secondary | ICD-10-CM | POA: Diagnosis not present

## 2018-10-24 DIAGNOSIS — J9811 Atelectasis: Secondary | ICD-10-CM | POA: Diagnosis not present

## 2018-10-24 DIAGNOSIS — R0902 Hypoxemia: Secondary | ICD-10-CM | POA: Diagnosis not present

## 2018-10-24 DIAGNOSIS — Z9181 History of falling: Secondary | ICD-10-CM

## 2018-10-24 DIAGNOSIS — K219 Gastro-esophageal reflux disease without esophagitis: Secondary | ICD-10-CM | POA: Diagnosis not present

## 2018-10-24 DIAGNOSIS — Z888 Allergy status to other drugs, medicaments and biological substances status: Secondary | ICD-10-CM

## 2018-10-24 DIAGNOSIS — Z79899 Other long term (current) drug therapy: Secondary | ICD-10-CM

## 2018-10-24 DIAGNOSIS — M25519 Pain in unspecified shoulder: Secondary | ICD-10-CM | POA: Diagnosis not present

## 2018-10-24 DIAGNOSIS — S199XXA Unspecified injury of neck, initial encounter: Secondary | ICD-10-CM | POA: Diagnosis not present

## 2018-10-24 DIAGNOSIS — Z87891 Personal history of nicotine dependence: Secondary | ICD-10-CM | POA: Diagnosis not present

## 2018-10-24 DIAGNOSIS — R918 Other nonspecific abnormal finding of lung field: Secondary | ICD-10-CM | POA: Diagnosis not present

## 2018-10-24 DIAGNOSIS — Z9841 Cataract extraction status, right eye: Secondary | ICD-10-CM

## 2018-10-24 DIAGNOSIS — Z1159 Encounter for screening for other viral diseases: Secondary | ICD-10-CM

## 2018-10-24 LAB — CBC WITH DIFFERENTIAL/PLATELET
Abs Immature Granulocytes: 0.11 10*3/uL — ABNORMAL HIGH (ref 0.00–0.07)
Basophils Absolute: 0.1 10*3/uL (ref 0.0–0.1)
Basophils Relative: 1 %
Eosinophils Absolute: 0.1 10*3/uL (ref 0.0–0.5)
Eosinophils Relative: 1 %
HCT: 37.6 % (ref 36.0–46.0)
Hemoglobin: 11.7 g/dL — ABNORMAL LOW (ref 12.0–15.0)
Immature Granulocytes: 1 %
Lymphocytes Relative: 5 %
Lymphs Abs: 0.7 10*3/uL (ref 0.7–4.0)
MCH: 28.3 pg (ref 26.0–34.0)
MCHC: 31.1 g/dL (ref 30.0–36.0)
MCV: 90.8 fL (ref 80.0–100.0)
Monocytes Absolute: 0.6 10*3/uL (ref 0.1–1.0)
Monocytes Relative: 4 %
Neutro Abs: 13.1 10*3/uL — ABNORMAL HIGH (ref 1.7–7.7)
Neutrophils Relative %: 88 %
Platelets: 317 10*3/uL (ref 150–400)
RBC: 4.14 MIL/uL (ref 3.87–5.11)
RDW: 14.2 % (ref 11.5–15.5)
WBC: 14.7 10*3/uL — ABNORMAL HIGH (ref 4.0–10.5)
nRBC: 0 % (ref 0.0–0.2)

## 2018-10-24 LAB — BASIC METABOLIC PANEL
Anion gap: 10 (ref 5–15)
BUN: 23 mg/dL (ref 8–23)
CO2: 23 mmol/L (ref 22–32)
Calcium: 9.2 mg/dL (ref 8.9–10.3)
Chloride: 106 mmol/L (ref 98–111)
Creatinine, Ser: 0.91 mg/dL (ref 0.44–1.00)
GFR calc Af Amer: >60 mL/min (ref >60)
GFR calc non Af Amer: 55 mL/min — ABNORMAL LOW (ref >60)
Glucose, Bld: 106 mg/dL — ABNORMAL HIGH (ref 70–99)
Potassium: 4 mmol/L (ref 3.5–5.1)
Sodium: 139 mmol/L (ref 135–145)

## 2018-10-24 LAB — SARS CORONAVIRUS 2 BY RT PCR (HOSPITAL ORDER, PERFORMED IN ~~LOC~~ HOSPITAL LAB): SARS Coronavirus 2: NEGATIVE

## 2018-10-24 MED ORDER — ACETAMINOPHEN 500 MG PO TABS
1000.0000 mg | ORAL_TABLET | Freq: Once | ORAL | Status: AC
  Administered 2018-10-24: 1000 mg via ORAL
  Filled 2018-10-24: qty 2

## 2018-10-24 MED ORDER — OXYCODONE HCL 5 MG PO TABS
10.0000 mg | ORAL_TABLET | ORAL | Status: DC | PRN
  Administered 2018-10-24 – 2018-10-27 (×8): 10 mg via ORAL
  Filled 2018-10-24 (×8): qty 2

## 2018-10-24 MED ORDER — MORPHINE SULFATE (PF) 4 MG/ML IV SOLN: 4.0000 mg | INTRAVENOUS | Status: DC | PRN

## 2018-10-24 MED ORDER — DORZOLAMIDE HCL 2 % OP SOLN
1.0000 [drp] | Freq: Two times a day (BID) | OPHTHALMIC | Status: DC
  Administered 2018-10-24 – 2018-10-28 (×8): 1 [drp] via OPHTHALMIC
  Filled 2018-10-24: qty 10

## 2018-10-24 MED ORDER — PANTOPRAZOLE SODIUM 40 MG PO TBEC
40.0000 mg | DELAYED_RELEASE_TABLET | Freq: Every day | ORAL | Status: DC
  Administered 2018-10-25 – 2018-10-28 (×4): 40 mg via ORAL
  Filled 2018-10-24 (×5): qty 1

## 2018-10-24 MED ORDER — CITALOPRAM HYDROBROMIDE 40 MG PO TABS
40.0000 mg | ORAL_TABLET | Freq: Every day | ORAL | Status: DC
  Administered 2018-10-25 – 2018-10-28 (×4): 40 mg via ORAL
  Filled 2018-10-24 (×4): qty 1

## 2018-10-24 MED ORDER — BISACODYL 10 MG RE SUPP: 10.0000 mg | Freq: Every day | RECTAL | Status: DC | PRN

## 2018-10-24 MED ORDER — LORAZEPAM 1 MG PO TABS
1.0000 mg | ORAL_TABLET | Freq: Two times a day (BID) | ORAL | Status: DC | PRN
  Administered 2018-10-25 – 2018-10-28 (×4): 1 mg via ORAL
  Filled 2018-10-24 (×4): qty 1

## 2018-10-24 MED ORDER — OXYCODONE HCL 5 MG PO TABS
5.0000 mg | ORAL_TABLET | ORAL | Status: DC | PRN
  Administered 2018-10-25: 5 mg via ORAL
  Filled 2018-10-24: qty 1

## 2018-10-24 MED ORDER — FENTANYL CITRATE (PF) 100 MCG/2ML IJ SOLN
50.0000 ug | Freq: Once | INTRAMUSCULAR | Status: AC
  Administered 2018-10-24: 50 ug via INTRAVENOUS
  Filled 2018-10-24: qty 2

## 2018-10-24 MED ORDER — ACETAMINOPHEN 500 MG PO TABS
1000.0000 mg | ORAL_TABLET | Freq: Four times a day (QID) | ORAL | Status: DC
  Administered 2018-10-24 – 2018-10-28 (×12): 1000 mg via ORAL
  Filled 2018-10-24 (×12): qty 2

## 2018-10-24 MED ORDER — ONDANSETRON HCL 4 MG/2ML IJ SOLN: 4.0000 mg | Freq: Four times a day (QID) | INTRAMUSCULAR | Status: DC | PRN

## 2018-10-24 MED ORDER — MORPHINE SULFATE (PF) 2 MG/ML IV SOLN
1.0000 mg | INTRAVENOUS | Status: DC | PRN
  Administered 2018-10-26: 1 mg via INTRAVENOUS
  Filled 2018-10-24: qty 1

## 2018-10-24 MED ORDER — OXYCODONE HCL 5 MG PO TABS
2.5000 mg | ORAL_TABLET | Freq: Once | ORAL | Status: AC
  Administered 2018-10-24: 2.5 mg via ORAL
  Filled 2018-10-24: qty 1

## 2018-10-24 MED ORDER — ONDANSETRON 4 MG PO TBDP: 4.0000 mg | ORAL_TABLET | Freq: Four times a day (QID) | ORAL | Status: DC | PRN

## 2018-10-24 MED ORDER — ENOXAPARIN SODIUM 40 MG/0.4ML ~~LOC~~ SOLN
40.0000 mg | SUBCUTANEOUS | Status: DC
  Administered 2018-10-25 – 2018-10-28 (×4): 40 mg via SUBCUTANEOUS
  Filled 2018-10-24 (×4): qty 0.4

## 2018-10-24 MED ORDER — DOCUSATE SODIUM 100 MG PO CAPS
100.0000 mg | ORAL_CAPSULE | Freq: Two times a day (BID) | ORAL | Status: DC
  Administered 2018-10-24 – 2018-10-28 (×8): 100 mg via ORAL
  Filled 2018-10-24 (×9): qty 1

## 2018-10-24 MED ORDER — MORPHINE SULFATE (PF) 2 MG/ML IV SOLN
2.0000 mg | INTRAVENOUS | Status: DC | PRN
  Administered 2018-10-24 – 2018-10-25 (×3): 2 mg via INTRAVENOUS
  Filled 2018-10-24 (×3): qty 1

## 2018-10-24 MED ORDER — TETANUS-DIPHTH-ACELL PERTUSSIS 5-2.5-18.5 LF-MCG/0.5 IM SUSP: 0.5000 mL | Freq: Once | INTRAMUSCULAR | Status: DC

## 2018-10-24 MED ORDER — CYCLOBENZAPRINE HCL 10 MG PO TABS
10.0000 mg | ORAL_TABLET | Freq: Three times a day (TID) | ORAL | Status: DC | PRN
  Administered 2018-10-25 – 2018-10-27 (×4): 10 mg via ORAL
  Filled 2018-10-24 (×6): qty 1

## 2018-10-24 MED ORDER — LIDOCAINE-EPINEPHRINE (PF) 2 %-1:200000 IJ SOLN
10.0000 mL | Freq: Once | INTRAMUSCULAR | Status: AC
  Administered 2018-10-24: 10 mL via INTRADERMAL
  Filled 2018-10-24: qty 10

## 2018-10-24 MED ORDER — SODIUM CHLORIDE 0.9 % IV SOLN
INTRAVENOUS | Status: DC
  Administered 2018-10-24 – 2018-10-26 (×4): via INTRAVENOUS

## 2018-10-24 MED ORDER — LATANOPROST 0.005 % OP SOLN
1.0000 [drp] | Freq: Every day | OPHTHALMIC | Status: DC
  Administered 2018-10-24 – 2018-10-27 (×4): 1 [drp] via OPHTHALMIC
  Filled 2018-10-24: qty 2.5

## 2018-10-24 NOTE — ED Provider Notes (Signed)
[facility] DEPT Provider Note   CSN: [id number] Arrival date & time: 10/24/18  1324    History   Chief Complaint Chief Complaint  Patient presents with   Fall    HPI Katrina Burnett is a 85 y.o. female.     85 yo F with a chief complaint of a fall.  The patient walked her dog in the rain and then when she came back inside she slipped on the wet floor.  She landed on the left side of her chest.  She apparently had a fall like this similar in the past week.  Having worsening left-sided chest pain.  Shortness of breath.  She denies head injury or loss of consciousness.  She has been having chronic neck pain and bilateral shoulder pain that is mildly worse.  She denies lower extremity pain.  The history is provided by the patient.  Fall This is a new problem. The current episode started 6 to 12 hours ago. The problem occurs constantly. The problem has not changed since onset.Associated symptoms include chest pain and shortness of breath. Pertinent negatives include no headaches. The symptoms are aggravated by bending and twisting (breathing). Nothing relieves the symptoms. She has tried nothing for the symptoms. The treatment provided no relief.    Past Medical History:  Diagnosis Date   Allergic rhinitis    Anxiety    Arthritis    "hands" (11/25/2016)   Barrett's esophagus 06/1997   CAP (community acquired pneumonia) 11/25/2016   "this is my 3rd time having pneumonia" (11/25/2016)   Chondromalacia    Chronic lower back pain    Depression    Diverticulosis    GERD (gastroesophageal reflux disease)    Glaucoma    History of hiatal hernia    History of stomach ulcers    Lumbar compression fracture (HCC)    L1 and L2   Osteoporosis    Pneumonia    Polymyalgia ([place])    Tubular adenoma of colon 06/1999   Vaginitis    Varicose veins     Patient Active Problem List   Diagnosis Date Noted   Pneumothorax on left 10/24/2018    CAP (community acquired pneumonia) 11/25/2016   Anemia 11/25/2016   Hyponatremia 11/25/2016   Fracture, intertrochanteric, right femur ([place]) 05/26/2015   Closed fracture of femur ([place])    Post-operative pain    Acute blood loss anemia    Adjustment disorder with mixed anxiety and depressed mood    Fall    Abnormality of gait    Femur fracture, right, closed, initial encounter 05/23/2015   Fracture of femoral neck, right, closed ([place]) 05/22/2015   Femoral neck fracture, right, closed, initial encounter 05/22/2015   Syncope 05/30/2012   Nausea & vomiting 05/30/2012   Chest pain 05/30/2012   Hypotension 05/30/2012   Esophageal reflux 12/16/2011   Lumbar compression fracture (HCC)    Polymyalgia ([place])     Past Surgical History:  Procedure Laterality Date   BACK SURGERY     CATARACT EXTRACTION W/ INTRAOCULAR LENS  IMPLANT, BILATERAL Bilateral    FIXATION KYPHOPLASTY LUMBAR SPINE  10/12/10; 09/20/11   FRACTURE SURGERY     IR KYPHO THORACIC WITH BONE BIOPSY  03/24/2018   IR VERTEBROPLASTY CERV/THOR BX INC UNI/BIL INC/INJECT/IMAGING  03/24/2018   LUMBAR SPINE SURGERY     "did OR on 2 fractures in my lower back"   LUNG BIOPSY     ORIF HIP FRACTURE Right 05/23/2015   Procedure: OPEN  REDUCTION INTERNAL FIXATION HIP;  Surgeon: Frederik Pear, MD;  Location: [facility];  Service: Orthopedics;  Laterality: Right;   VARICOSE VEIN SURGERY     vein ligation and stripping "years ago"     OB History    Gravida  4   Para  4   Term  4   Preterm      AB      Living  4     SAB      TAB      Ectopic      Multiple      Live Births               Home Medications    Prior to Admission medications   Medication Sig Start Date End Date Taking? Authorizing Provider  cetirizine (ZYRTEC) 10 MG tablet Take 10 mg by mouth daily.   Yes [provider]  cholecalciferol (VITAMIN D) 1000 UNITS tablet Take 1,000 Units by mouth at bedtime.    Yes [provider]  citalopram (CELEXA) 40 MG tablet Take 40 mg by mouth daily. 09/12/16  Yes [provider]  dorzolamide (TRUSOPT) 2 % ophthalmic solution Place 1 drop into both eyes 2 (two) times daily.    Yes [provider]  esomeprazole (NEXIUM) 40 MG capsule TAKE 1 CAPSULE BY MOUTH EVERY DAY Patient taking differently: Take 40 mg by mouth daily.  08/14/18  Yes Ladene Artist, MD  fluticasone (FLONASE) 50 MCG/ACT nasal spray Place 1 spray into both nostrils daily as needed for allergies.    Yes [provider]  ibuprofen (ADVIL,MOTRIN) 200 MG tablet Take 400 mg by mouth every 6 (six) hours as needed for mild pain.    Yes [provider]  ketoconazole (NIZORAL) 2 % shampoo Apply 1 application topically daily as needed for irritation.  02/10/18  Yes [provider]  latanoprost (XALATAN) 0.005 % ophthalmic solution Place 1 drop into both eyes at bedtime.   Yes [provider]  LORazepam (ATIVAN) 1 MG tablet Take 1 mg by mouth 2 (two) times daily as needed for anxiety.   Yes [provider]  Multiple Vitamin (MULTIVITAMIN) tablet Take 1 tablet by mouth at bedtime.    Yes [provider]  oxyCODONE-acetaminophen (PERCOCET) 5-325 MG tablet Take 1-2 tablets by mouth every 6 (six) hours as needed. 03/02/18  Yes Milton Ferguson, MD  Probiotic Product (ALIGN) 4 MG CAPS Take 4 mg by mouth at bedtime.    Yes [provider]  traZODone (DESYREL) 50 MG tablet Take 0.5 tablets (25 mg total) by mouth at bedtime. Patient taking differently: Take 25-50 mg by mouth at bedtime as needed for sleep.  06/04/15  Yes Angiulli, Lavon Paganini, PA-C  glycopyrrolate (ROBINUL) 1 MG tablet Take 1 tablet (1 mg total) by mouth 2 (two) times daily. Patient not taking: Reported on 10/24/2018 07/23/16   Ladene Artist, MD  HYDROcodone-acetaminophen [facility]) 5-325 MG tablet Take 1 tablet by mouth every 6 (six) hours as needed for moderate pain. Patient not  taking: Reported on 10/24/2018 06/04/15   Angiulli, Lavon Paganini, PA-C  traMADol (ULTRAM) 50 MG tablet Take 1 tablet (50 mg total) by mouth every 6 (six) hours as needed for moderate pain. Patient not taking: Reported on 10/24/2018 06/04/15   Cathlyn Parsons, PA-C    Family History Family History  Problem Relation Age of Onset   Colon cancer Mother 48   Congestive Heart Failure Mother    Heart  disease Father    Alcohol abuse Father    Heart disease Maternal Grandmother    Fibromyalgia Son     Social History Social History   Tobacco Use   Smoking status: Former Smoker    Years: 1.00   Smokeless tobacco: Never Used   Tobacco comment: At age 70 yrs old but nothing since   Substance Use Topics   Alcohol use: No   Drug use: No     Allergies   Avelox [moxifloxacin hcl in nacl], Biaxin [clarithromycin], Cefuroxime, Levaquin [levofloxacin in d5w], Minocycline, Paxil [paroxetine hcl], Prednisone, Remeron [mirtazapine], Sulfur, Trimox [amoxicillin], and Viibryd [vilazodone hcl]   Review of Systems Review of Systems  Constitutional: Negative for chills and fever.  HENT: Negative for congestion and rhinorrhea.   Eyes: Negative for redness and visual disturbance.  Respiratory: Positive for shortness of breath. Negative for wheezing.   Cardiovascular: Positive for chest pain. Negative for palpitations.  Gastrointestinal: Negative for nausea and vomiting.  Genitourinary: Negative for dysuria and urgency.  Musculoskeletal: Positive for arthralgias and neck pain. Negative for myalgias.  Skin: Negative for pallor and wound.  Neurological: Negative for dizziness and headaches.     Physical Exam Updated Vital Signs BP (!) 166/89    Pulse 86    Temp 97.8 F (36.6 C) (Oral)    Resp 16    SpO2 100%   Physical Exam Vitals signs and nursing note reviewed.  Constitutional:      General: She is not in acute distress.    Appearance: She is well-developed. She is not diaphoretic.    HENT:     Head: Normocephalic and atraumatic.  Eyes:     Pupils: Pupils are equal, round, and reactive to light.  Neck:     Musculoskeletal: Normal range of motion and neck supple.  Cardiovascular:     Rate and Rhythm: Normal rate and regular rhythm.     Heart sounds: No murmur. No friction rub. No gallop.   Pulmonary:     Effort: Pulmonary effort is normal.     Breath sounds: No wheezing or rales.  Abdominal:     General: There is no distension.     Palpations: Abdomen is soft.     Tenderness: There is no abdominal tenderness.  Musculoskeletal:        General: Tenderness present.     Comments: Tenderness about the left anterior lateral chest wall.  Slightly diminished breath sounds to the left  Skin:    General: Skin is warm and dry.  Neurological:     Mental Status: She is alert and oriented to person, place, and time.  Psychiatric:        Behavior: Behavior normal.      ED Treatments / Results  Labs (all labs ordered are listed, but only abnormal results are displayed) Labs Reviewed  CBC WITH DIFFERENTIAL/PLATELET - Abnormal; Notable for the following components:      Result Value   WBC 14.7 (*)    Hemoglobin 11.7 (*)    Neutro Abs 13.1 (*)    Abs Immature Granulocytes 0.11 (*)    All other components within normal limits  BASIC METABOLIC PANEL - Abnormal; Notable for the following components:   Glucose, Bld 106 (*)    GFR calc non Af Amer 55 (*)    All other components within normal limits  SARS CORONAVIRUS 2 (HOSPITAL ORDER, [facility])    EKG None  Radiology Ct Chest Wo Contrast  Result Date: 10/24/2018 CLINICAL DATA:  Slip and fall on a porch approximately 2 hours earlier today. Chest pain. Question rib fracture. Initial encounter. EXAM: CT CHEST WITHOUT CONTRAST TECHNIQUE: Multidetector CT imaging of the chest was performed following the standard protocol without IV contrast. COMPARISON:  None. FINDINGS: Cardiovascular: Heart  size is upper normal. No pericardial effusion. Scattered atherosclerotic calcifications noted. Mediastinum/Nodes: No enlarged mediastinal or axillary lymph nodes. Thyroid gland, trachea, and esophagus demonstrate no significant findings. Lungs/Pleura: The patient has a left pneumothorax estimated at 25-30%. The right lung is expanded. Mild atelectasis on the left is identified. There is some basilar scar or linear atelectasis. No nodule, mass or consolidative process. Small left pleural effusion noted. No right effusion. Upper Abdomen: Negative. Musculoskeletal: Acute left eighth and ninth rib fractures are identified. The patient has remote T2, T6, T8, T9, L1 and L2 fractures. IMPRESSION: Left pneumothorax estimated at 25-30% with an associated small left effusion. Acute fractures of the posterior arcs of the left eighth and ninth ribs. Remote thoracic spine fractures. Critical Value/emergent results were called by telephone at the time of interpretation on 10/24/2018 at 3:04 pm to Dr. Deno Etienne , who verbally acknowledged these results. Aortic Atherosclerosis (ICD10-I70.0). Electronically Signed   By: Inge Rise M.D.   On: 10/24/2018 15:06   Ct Cervical Spine Wo Contrast  Result Date: 10/24/2018 CLINICAL DATA:  Pain status post fall EXAM: CT CERVICAL SPINE WITHOUT CONTRAST TECHNIQUE: Multidetector CT imaging of the cervical spine was performed without intravenous contrast. Multiplanar CT image reconstructions were also generated. COMPARISON:  None. FINDINGS: Alignment: Normal Skull base and vertebrae: No acute fracture. No primary bone lesion or focal pathologic process. Soft tissues and spinal canal: No prevertebral fluid or swelling. No visible canal hematoma. Disc levels: Mild-to-moderate multilevel disc height loss is noted, greatest at the lower cervical segments. Upper chest: Again noted is a left-sided pneumothorax. There is airspace opacity at the right lung apex measuring approximately 1.7 cm.  The appearance favors scarring. Other: None. IMPRESSION: 1. No acute cervical spine fracture. 2. Again noted is a partially visualized left-sided pneumothorax. This is better visualized on the patient's recent CT of the chest. Electronically Signed   By: Constance Holster M.D.   On: 10/24/2018 15:26    Procedures CHEST TUBE INSERTION  Date/Time: 10/24/2018 5:12 PM Performed by: Deno Etienne, DO Authorized by: Deno Etienne, DO   Consent:    Consent obtained:  Verbal   Consent given by:  Patient   Risks discussed:  Bleeding, damage to surrounding structures, infection and incomplete drainage   Alternatives discussed:  No treatment, delayed treatment, alternative treatment and observation Pre-procedure details:    Skin preparation:  ChloraPrep   Preparation: Patient was prepped and draped in the usual sterile fashion   Anesthesia (see MAR for exact dosages):    Anesthesia method:  Local infiltration   Local anesthetic:  Lidocaine 2% WITH epi Procedure details:    Placement location:  L lateral   Scalpel size:  11   Tube size (Fr):  12   Dissection instrument:  Finger   Ultrasound guidance: no     Tension pneumothorax: no     Tube connected to:  Water seal   Drainage characteristics:  Air only   Suture material:  2-0 silk   Dressing:  4x4 sterile gauze, petrolatum-impregnated gauze and Xeroform gauze Post-procedure details:    Post-insertion x-ray findings: tube in good position     Patient tolerance of procedure:  Tolerated well, no  immediate complications   (including critical care time)  Medications Ordered in ED Medications  acetaminophen (TYLENOL) tablet 1,000 mg (1,000 mg Oral Given 10/24/18 1541)  oxyCODONE (Oxy IR/ROXICODONE) immediate release tablet 2.5 mg (2.5 mg Oral Given 10/24/18 1541)  fentaNYL (SUBLIMAZE) injection 50 mcg (50 mcg Intravenous Given 10/24/18 1520)  lidocaine-EPINEPHrine (XYLOCAINE W/EPI) 2 %-1:200000 (PF) injection 10 mL (10 mLs Intradermal Given by Other  10/24/18 1543)  fentaNYL (SUBLIMAZE) injection 50 mcg (50 mcg Intravenous Given 10/24/18 1738)     Initial Impression / Assessment and Plan / ED Course  I have reviewed the triage vital signs and the nursing notes.  Pertinent labs & imaging results that were available during my care of the patient were reviewed by me and considered in my medical decision making (see chart for details).        85 yo F with a chief complaints of left-sided chest pain after a fall.  This happened earlier today mechanical in nature.  Patient was having some severe pain and feeling shortness of breath and so I elected for a CT scan to approximate the exact number of ribs that she had fractured.  It was found that she had a left-sided pneumothorax.  I discussed this with general surgery and I placed the chest tube.  They will admit to trauma.  Called from pharmacy, Tdap was ordered by the trauma service.  She was able to verify that the patient had her last tetanus updated in 2014.  Will cancel at this time.  CRITICAL CARE Performed by: Cecilio Asper   Total critical care time: 35 minutes  Critical care time was exclusive of separately billable procedures and treating other patients.  Critical care was necessary to treat or prevent imminent or life-threatening deterioration.  Critical care was time spent personally by me on the following activities: development of treatment plan with patient and/or surrogate as well as nursing, discussions with consultants, evaluation of patient's response to treatment, examination of patient, obtaining history from patient or surrogate, ordering and performing treatments and interventions, ordering and review of laboratory studies, ordering and review of radiographic studies, pulse oximetry and re-evaluation of patient's condition.   The patients results and plan were reviewed and discussed.   Any x-rays performed were independently reviewed by myself.    Differential diagnosis were considered with the presenting HPI.  Medications  acetaminophen (TYLENOL) tablet 1,000 mg (1,000 mg Oral Given 10/24/18 1541)  oxyCODONE (Oxy IR/ROXICODONE) immediate release tablet 2.5 mg (2.5 mg Oral Given 10/24/18 1541)  fentaNYL (SUBLIMAZE) injection 50 mcg (50 mcg Intravenous Given 10/24/18 1520)  lidocaine-EPINEPHrine (XYLOCAINE W/EPI) 2 %-1:200000 (PF) injection 10 mL (10 mLs Intradermal Given by Other 10/24/18 1543)  fentaNYL (SUBLIMAZE) injection 50 mcg (50 mcg Intravenous Given 10/24/18 1738)    Vitals:   10/24/18 1530 10/24/18 1547 10/24/18 1630 10/24/18 1700  BP: 132/76  (!) 164/88 (!) 166/89  Pulse:  80 85 86  Resp: 14 16 17 16   Temp:      TempSrc:      SpO2:  100% 99% 100%    Final diagnoses:  Closed fracture of multiple ribs of left side, initial encounter  Pneumothorax on left    Admission/ observation were discussed with the admitting physician, patient and/or family and they are comfortable with the plan.    Final Clinical Impressions(s) / ED Diagnoses   Final diagnoses:  Closed fracture of multiple ribs of left side, initial encounter  Pneumothorax on left    ED  Discharge Orders    None       Deno Etienne, [place] 10/24/18 1751

## 2018-10-24 NOTE — ED Notes (Signed)
Attempted to call report to [facility]. The primary nurse will call back momentarily.

## 2018-10-24 NOTE — H&P (Addendum)
[facility] Admission Note  Katrina Burnett 25-Nov-1924  [id number].    Requesting MD: Dr. Deno Etienne Chief Complaint/Reason for Consult: Fall, rib fractures, PTX   HPI: Katrina Burnett is a 85 y.o. female with a history of chronic low back pain on chronic pain medication, arthritis, GERD who presented the ED today after a fall.  The patient reports that earlier this morning she was taking her dog back inside and slipped on a wet porch step and landed on her left side.  She denies any head trauma or loss of consciousness.  She is not any blood thinners.  She denies being down for prolonged period of time.  She reportedly was able to walk back inside and take an oxycodone before calling the ambulance.  She complains of left-sided chest pain, shortness of breath and left shoulder pain.  She denies any headache, changes in vision, neck pain, back pain, abdominal pain, lower extremity pain, nausea or vomiting.  She lives at home with her son [name]. He was reportedly out when this happened and did not witness this. She also had a fall 1.5 weeks ago per family for which she saw her PCP for.  She normally ambulates with a walker at home and is fairly independent.   In the ED patient underwent a CT of the cervical spine and chest.  CT cervical spine without evidence of cervical spine fracture.  CT chest with a left-sided pneumothorax estimated at 25-30% with a small left effusion and acute fractures of the left eighth and ninth posterior ribs.  Patient currently on nonrebreather.  Her oxygen saturations dropped to 81-85% with talking.  EDP to place chest tube.  ROS: Review of Systems  Constitutional: Negative for chills and fever.  HENT: Negative for hearing loss and tinnitus.   Eyes: Negative for blurred vision.  Respiratory: Positive for shortness of breath.   Cardiovascular: Positive for chest pain. Negative for leg swelling.  Gastrointestinal: Negative for abdominal pain, nausea and vomiting.   Musculoskeletal: Positive for falls, joint pain and myalgias. Negative for back pain and neck pain.  Neurological: Negative for dizziness, tingling, sensory change, speech change, focal weakness and headaches.  All other systems reviewed and are negative.  All systems reviewed and otherwise negative except for as above  Family History  Problem Relation Age of Onset  . Colon cancer Mother 50  . Congestive Heart Failure Mother   . Heart disease Father   . Alcohol abuse Father   . Heart disease Maternal Grandmother   . Fibromyalgia Son     Past Medical History:  Diagnosis Date  . Allergic rhinitis   . Anxiety   . Arthritis    "hands" (11/25/2016)  . Barrett's esophagus 06/1997  . CAP (community acquired pneumonia) 11/25/2016   "this is my 3rd time having pneumonia" (11/25/2016)  . Chondromalacia   . Chronic lower back pain   . Depression   . Diverticulosis   . GERD (gastroesophageal reflux disease)   . Glaucoma   . History of hiatal hernia   . History of stomach ulcers   . Lumbar compression fracture (HCC)    L1 and L2  . Osteoporosis   . Pneumonia   . Polymyalgia ([place])   . Tubular adenoma of colon 06/1999  . Vaginitis   . Varicose veins     Past Surgical History:  Procedure Laterality Date  . BACK SURGERY    . CATARACT EXTRACTION W/ INTRAOCULAR LENS  IMPLANT, BILATERAL Bilateral   .  FIXATION KYPHOPLASTY LUMBAR SPINE  10/12/10; 09/20/11  . FRACTURE SURGERY    . IR KYPHO THORACIC WITH BONE BIOPSY  03/24/2018  . IR VERTEBROPLASTY CERV/THOR BX INC UNI/BIL INC/INJECT/IMAGING  03/24/2018  . LUMBAR SPINE SURGERY     "did OR on 2 fractures in my lower back"  . LUNG BIOPSY    . ORIF HIP FRACTURE Right 05/23/2015   Procedure: OPEN REDUCTION INTERNAL FIXATION HIP;  Surgeon: Frederik Pear, MD;  Location: [facility];  Service: Orthopedics;  Laterality: Right;  Marland Kitchen VARICOSE VEIN SURGERY     vein ligation and stripping "years ago"    Social History:  reports that she has quit smoking. She  quit after 1.00 year of use. She has never used smokeless tobacco. She reports that she does not drink alcohol or use drugs.  Allergies:  Allergies  Allergen Reactions  . Avelox [Moxifloxacin Hcl In Nacl] Other (See Comments)    Felt bad  . Biaxin [Clarithromycin] Nausea Only  . Cefuroxime Diarrhea  . Levaquin [Levofloxacin In D5w] Diarrhea  . Minocycline Nausea And Vomiting  . Paxil [Paroxetine Hcl] Nausea And Vomiting  . Prednisone Nausea Only  . Remeron [Mirtazapine]     Doesn't remember   . Sulfur Nausea And Vomiting  . Trimox [Amoxicillin] Other (See Comments)    Doesn't remember  Has patient had a PCN reaction causing immediate rash, facial/tongue/throat swelling, SOB or lightheadedness with hypotension: Unknown Has patient had a PCN reaction causing severe rash involving mucus membranes or skin necrosis: Unknown Has patient had a PCN reaction that required hospitalization: Unknown Has patient had a PCN reaction occurring within the last 10 years: Unknown If all of the above answers are "NO", then may proceed with Cephalosporin use.  Arman Filter Hcl] Other (See Comments)    Doesn't remember     (Not in a hospital admission)   Prior to Admission medications   Medication Sig Start Date End Date Taking? Authorizing Provider  cetirizine (ZYRTEC) 10 MG tablet Take 10 mg by mouth daily.    [provider]  cholecalciferol (VITAMIN D) 1000 UNITS tablet Take 1,000 Units by mouth daily.    [provider]  citalopram (CELEXA) 40 MG tablet Take 40 mg by mouth daily. 09/12/16   [provider]  cyclobenzaprine (FLEXERIL) 10 MG tablet Take 10 mg by mouth 3 (three) times daily as needed for muscle spasms.     [provider]  dorzolamide (TRUSOPT) 2 % ophthalmic solution Place 1 drop into both eyes 2 (two) times daily.     [provider]  esomeprazole (NEXIUM) 40 MG capsule TAKE 1 CAPSULE BY MOUTH EVERY DAY 08/14/18   Ladene Artist,  MD  fluticasone [facility]) 50 MCG/ACT nasal spray Place 1 spray into both nostrils daily as needed for allergies.     [provider]  glycopyrrolate (ROBINUL) 1 MG tablet Take 1 tablet (1 mg total) by mouth 2 (two) times daily. Patient not taking: Reported on 03/02/2018 07/23/16   Ladene Artist, MD  HYDROcodone-acetaminophen [facility]) 5-325 MG tablet Take 1 tablet by mouth every 6 (six) hours as needed for moderate pain. 06/04/15   Angiulli, Lavon Paganini, PA-C  ibuprofen (ADVIL,MOTRIN) 200 MG tablet Take 200 mg by mouth every 6 (six) hours as needed for mild pain.    [provider]  ketoconazole (NIZORAL) 2 % shampoo Apply 1 application topically daily as needed for irritation.  02/10/18   [provider]  latanoprost (XALATAN) 0.005 %  ophthalmic solution Place 1 drop into both eyes at bedtime.    [provider]  LORazepam (ATIVAN) 1 MG tablet Take 1 mg by mouth 2 (two) times daily as needed for anxiety.    [provider]  Multiple Vitamin (MULTIVITAMIN) tablet Take 1 tablet by mouth daily.    [provider]  oxyCODONE-acetaminophen (PERCOCET) 5-325 MG tablet Take 1-2 tablets by mouth every 6 (six) hours as needed. 03/02/18   Milton Ferguson, MD  Probiotic Product (ALIGN) 4 MG CAPS Take 4 mg by mouth daily.    [provider]  traMADol (ULTRAM) 50 MG tablet Take 1 tablet (50 mg total) by mouth every 6 (six) hours as needed for moderate pain. 06/04/15   Angiulli, Lavon Paganini, PA-C  traZODone (DESYREL) 50 MG tablet Take 0.5 tablets (25 mg total) by mouth at bedtime. Patient taking differently: Take 25-50 mg by mouth at bedtime as needed for sleep.  06/04/15   Angiulli, Lavon Paganini, PA-C    Blood pressure 132/76, pulse 80, temperature 97.8 F (36.6 C), temperature source Oral, resp. rate 16, SpO2 100 %. Physical Exam  Constitutional: She is oriented to person, place, and time and well-developed, well-nourished, and in no distress. No distress.   Frail, elderly appearing female sitting up in bed on nonrebreather.  HENT:  Head: Normocephalic. Head is without raccoon's eyes, without Battle's sign and without abrasion.  Right Ear: External ear normal. No hemotympanum.  Left Ear: External ear normal. No hemotympanum.  Nose: Nose normal.  Mouth/Throat: Uvula is midline, oropharynx is clear and moist and mucous membranes are normal. Normal dentition. No lacerations.  No palpable open infra skull fractures.  No raccoon eyes or battle signs.  No hemotympanum.  No CSF otorrhea.  No evidence of dental trauma.  Eyes: Pupils are equal, round, and reactive to light. Conjunctivae, EOM and lids are normal.  Extraocular movements intact without entrapment  Neck: Trachea normal, normal range of motion and full passive range of motion without pain. Neck supple.  No C-spine tenderness palpation or step-offs.  Patient able to rotate her neck 45 degrees left and right without pain.  She is able to flex and extend her neck without pain.  Cardiovascular: Normal rate, regular rhythm, normal heart sounds and normal pulses.  No murmur heard. Pulses:      Right radial pulse not accessible and left radial pulse not accessible.       Right dorsalis pedis pulse not accessible and left dorsalis pedis pulse not accessible.       Right posterior tibial pulse not accessible and left posterior tibial pulse not accessible.  Pulmonary/Chest: Effort normal. No accessory muscle usage. No respiratory distress. She has decreased breath sounds in the left upper field. She exhibits tenderness.  Patient on nonrebreather.  O2 sats dropped from 95% to ~80-85% with phonation.  Tenderness along the left posterior chest wall.  No crepitus noted.  No flail chest.  Abdominal: Soft. Normal appearance and bowel sounds are normal. There is no abdominal tenderness. There is no rigidity, no rebound and no guarding.  Musculoskeletal:     Comments: Mild tenderness to the left shoulder.  No  deformity.  Passive range of motion of bilateral upper and lower extremities without pain or difficulty.  Negative logroll test bilaterally.  No sacral crepitus.  Compartments soft to upper and lower extremities.  Neurological: She is alert and oriented to person, place, and time. She has normal motor skills and intact cranial nerves. She is not  agitated. She displays no tremor, facial symmetry and normal speech. No cranial nerve deficit. GCS score is 15.  Skin: Skin is warm and dry. Abrasion noted. No laceration noted. She is not diaphoretic.  Mild abrasion to right shin  Psychiatric: Mood, memory, affect and judgment normal.  Nursing note and vitals reviewed.    Results for orders placed or performed during the hospital encounter of 10/24/18 (from the past 48 hour(s))  CBC with Differential     Status: Abnormal   Collection Time: 10/24/18  3:22 PM  Result Value Ref Range   WBC 14.7 (H) 4.0 - 10.5 K/uL   RBC 4.14 3.87 - 5.11 MIL/uL   Hemoglobin 11.7 (L) 12.0 - 15.0 g/dL   HCT 37.6 36.0 - 46.0 %   MCV 90.8 80.0 - 100.0 fL   MCH 28.3 26.0 - 34.0 pg   MCHC 31.1 30.0 - 36.0 g/dL   RDW 14.2 11.5 - 15.5 %   Platelets 317 150 - 400 K/uL   nRBC 0.0 0.0 - 0.2 %   Neutrophils Relative % 88 %   Neutro Abs 13.1 (H) 1.7 - 7.7 K/uL   Lymphocytes Relative 5 %   Lymphs Abs 0.7 0.7 - 4.0 K/uL   Monocytes Relative 4 %   Monocytes Absolute 0.6 0.1 - 1.0 K/uL   Eosinophils Relative 1 %   Eosinophils Absolute 0.1 0.0 - 0.5 K/uL   Basophils Relative 1 %   Basophils Absolute 0.1 0.0 - 0.1 K/uL   Immature Granulocytes 1 %   Abs Immature Granulocytes 0.11 (H) 0.00 - 0.07 K/uL    Comment: Performed at [facility], [place] [address], [place], [place] [address]  Basic metabolic panel     Status: Abnormal   Collection Time: 10/24/18  3:22 PM  Result Value Ref Range   Sodium 139 135 - 145 mmol/L   Potassium 4.0 3.5 - 5.1 mmol/L   Chloride 106 98 - 111 mmol/L   CO2 23 22 - 32 mmol/L    Glucose, Bld 106 (H) 70 - 99 mg/dL   BUN 23 8 - 23 mg/dL   Creatinine, Ser 0.91 0.44 - 1.00 mg/dL   Calcium 9.2 8.9 - 10.3 mg/dL   GFR calc non Af Amer 55 (L) >60 mL/min   GFR calc Af Amer >60 >60 mL/min   Anion gap 10 5 - 15    Comment: Performed at [facility], [place] [address], [place], [place] [address]   Ct Chest Wo Contrast  Result Date: 10/24/2018 CLINICAL DATA:  Slip and fall on a porch approximately 2 hours earlier today. Chest pain. Question rib fracture. Initial encounter. EXAM: CT CHEST WITHOUT CONTRAST TECHNIQUE: Multidetector CT imaging of the chest was performed following the standard protocol without IV contrast. COMPARISON:  None. FINDINGS: Cardiovascular: Heart size is upper normal. No pericardial effusion. Scattered atherosclerotic calcifications noted. Mediastinum/Nodes: No enlarged mediastinal or axillary lymph nodes. Thyroid gland, trachea, and esophagus demonstrate no significant findings. Lungs/Pleura: The patient has a left pneumothorax estimated at 25-30%. The right lung is expanded. Mild atelectasis on the left is identified. There is some basilar scar or linear atelectasis. No nodule, mass or consolidative process. Small left pleural effusion noted. No right effusion. Upper Abdomen: Negative. Musculoskeletal: Acute left eighth and ninth rib fractures are identified. The patient has remote T2, T6, T8, T9, L1 and L2 fractures. IMPRESSION: Left pneumothorax estimated at 25-30% with an associated small left effusion. Acute fractures of the posterior arcs of  the left eighth and ninth ribs. Remote thoracic spine fractures. Critical Value/emergent results were called by telephone at the time of interpretation on 10/24/2018 at 3:04 pm to Dr. Deno Etienne , who verbally acknowledged these results. Aortic Atherosclerosis (ICD10-I70.0). Electronically Signed   By: Inge Rise M.D.   On: 10/24/2018 15:06   Ct Cervical Spine Wo Contrast  Result Date:  10/24/2018 CLINICAL DATA:  Pain status post fall EXAM: CT CERVICAL SPINE WITHOUT CONTRAST TECHNIQUE: Multidetector CT imaging of the cervical spine was performed without intravenous contrast. Multiplanar CT image reconstructions were also generated. COMPARISON:  None. FINDINGS: Alignment: Normal Skull base and vertebrae: No acute fracture. No primary bone lesion or focal pathologic process. Soft tissues and spinal canal: No prevertebral fluid or swelling. No visible canal hematoma. Disc levels: Mild-to-moderate multilevel disc height loss is noted, greatest at the lower cervical segments. Upper chest: Again noted is a left-sided pneumothorax. There is airspace opacity at the right lung apex measuring approximately 1.7 cm. The appearance favors scarring. Other: None. IMPRESSION: 1. No acute cervical spine fracture. 2. Again noted is a partially visualized left-sided pneumothorax. This is better visualized on the patient's recent CT of the chest. Electronically Signed   By: Constance Holster M.D.   On: 10/24/2018 15:26   Anti-infectives (From admission, onward)   None       Assessment/Plan Fall Left 8th and 9th rib fx - Pulm toliet, IS, pain control Left PTX 25-30% - EDP to place chest tube. O2. Pulm toliet, IS, pain control, repeat CXR in AM. PT/OT Right shin abrasion - Tetanus vaccine, local wound care Anemia - Hgb 11.7 on admission. No hypotension or tachycardia. Appears at baseline from last CBC on 03/21/18 C-spine cleared  ID - None VTE - SCDs, Lovenox  FEN - Regular POC - Willeen Cass (Daughter) [phone] [facility]) or [phone] (Mobile) & [name] [phone] (mobile) - I spoke and updated the patient's daughter on the phone. The patients son, [name], was in the background and was updated on this as well.   Jillyn Ledger, [facility] 10/24/2018, 4:27 PM Pager: [phone]

## 2018-10-24 NOTE — ED Notes (Signed)
Patient being transported via [facility] to [facility].

## 2018-10-24 NOTE — ED Notes (Signed)
X-ray at bedside

## 2018-10-24 NOTE — ED Notes (Signed)
Gave report to Judie Petit, RN foir room [facility] [id number]. Also, informed [facility] of transportation.

## 2018-10-24 NOTE — ED Notes (Signed)
At bedside with MD Leia Alf, RN for chest tube insertion.  [place]ara suction canister attached to suction at preset 20 cm.  1 L sterile water, extra petroleum dressing, and tape at bedside.  NRB removed from pt, pt on RA.  Will continue to monitor.

## 2018-10-24 NOTE — ED Notes (Signed)
Patient complaining severe pain left lateral rib cage area-chest tube intact-small amount bloody drainage noted-suction -20 to Pleur-Evac. Breath sounds essentially cleat-slighly diminished LLL-O2 sat 98% on O2 2l/[place]-Kaitlin RN obtaining pain medication.

## 2018-10-24 NOTE — ED Notes (Signed)
Bed: WA08 Expected date:  Expected time:  Means of arrival:  Comments: EMS 90 something fall ROOM 8

## 2018-10-24 NOTE — ED Triage Notes (Signed)
Pt BIBA from home c/o unwitnessed mechanical fall.  Pt slipped on the patio.  C/o left shoulder, stomach and mid back pain.  Pt reports she fell, got up, went inside and took an oxycodone, then called ambulance. Denies LOC, denies taking blood thinners.

## 2018-10-25 ENCOUNTER — Other Ambulatory Visit: Payer: Self-pay

## 2018-10-25 ENCOUNTER — Inpatient Hospital Stay (HOSPITAL_COMMUNITY): Payer: Medicare Other

## 2018-10-25 LAB — CBC
HCT: 34.6 % — ABNORMAL LOW (ref 36.0–46.0)
Hemoglobin: 10.6 g/dL — ABNORMAL LOW (ref 12.0–15.0)
MCH: 27.2 pg (ref 26.0–34.0)
MCHC: 30.6 g/dL (ref 30.0–36.0)
MCV: 88.9 fL (ref 80.0–100.0)
Platelets: 313 10*3/uL (ref 150–400)
RBC: 3.89 MIL/uL (ref 3.87–5.11)
RDW: 14 % (ref 11.5–15.5)
WBC: 9 10*3/uL (ref 4.0–10.5)
nRBC: 0 % (ref 0.0–0.2)

## 2018-10-25 LAB — BASIC METABOLIC PANEL
Anion gap: 6 (ref 5–15)
BUN: 16 mg/dL (ref 8–23)
CO2: 23 mmol/L (ref 22–32)
Calcium: 8.9 mg/dL (ref 8.9–10.3)
Chloride: 108 mmol/L (ref 98–111)
Creatinine, Ser: 0.73 mg/dL (ref 0.44–1.00)
GFR calc Af Amer: >60 mL/min (ref >60)
GFR calc non Af Amer: >60 mL/min (ref >60)
Glucose, Bld: 99 mg/dL (ref 70–99)
Potassium: 4.5 mmol/L (ref 3.5–5.1)
Sodium: 137 mmol/L (ref 135–145)

## 2018-10-25 LAB — MRSA PCR SCREENING: MRSA by PCR: POSITIVE — AB

## 2018-10-25 MED ORDER — CHLORHEXIDINE GLUCONATE CLOTH 2 % EX PADS
6.0000 | MEDICATED_PAD | Freq: Every day | CUTANEOUS | Status: DC
  Administered 2018-10-25 – 2018-10-28 (×3): 6 via TOPICAL

## 2018-10-25 MED ORDER — ORAL CARE MOUTH RINSE
15.0000 mL | Freq: Two times a day (BID) | OROMUCOSAL | Status: DC
  Administered 2018-10-25 – 2018-10-28 (×7): 15 mL via OROMUCOSAL

## 2018-10-25 MED ORDER — MUPIROCIN 2 % EX OINT
1.0000 "application " | TOPICAL_OINTMENT | Freq: Two times a day (BID) | CUTANEOUS | Status: DC
  Administered 2018-10-25 – 2018-10-28 (×7): 1 via NASAL
  Filled 2018-10-25 (×2): qty 22

## 2018-10-25 NOTE — Evaluation (Addendum)
Occupational Therapy Evaluation Patient Details Name: Katrina Burnett MRN: [id number] DOB: [birth year] Today's Date: 10/25/2018    History of Present Illness 85 y.o. female with a history of chronic low back pain on chronic pain medication, arthritis, GERD who presented the ED today after a fall. Pt sustained L 8-9 rib fx, L PTX.    Clinical Impression   This 85 y/o female presents with the above. PTA pt reports independent with ADL and functional mobility, was driving. Pt very emotional this AM and throughout session, partly due to wanting to go home but suspect also due to pain. Pt currently requires minA for stand pivot transfers and to take few steps to recliner in room (use of HHA, +2 lines/safety). She currently requires minguard assist for seated UB ADL, modA for LB and toileting ADL. Pt reports she lives with son who is able to assist some, reports she is also willing to hire a home care nurse if necessary. She will benefit form continued acute OT services and recommend follow up [facility] therapy services to maximize her safety and independence with ADL and mobility. Will follow.     Follow Up Recommendations  Home health OT;Supervision/Assistance - 24 hour(HH aide, if needed? )    Equipment Recommendations  3 in 1 bedside commode           Precautions / Restrictions Precautions Precautions: Fall Precaution Comments: chest tube to suction Restrictions Weight Bearing Restrictions: No      Mobility Bed Mobility Overal bed mobility: Needs Assistance Bed Mobility: Supine to Sit     Supine to sit: Min assist     General bed mobility comments: assist only to scoot hips towards EOB; pt able to transition to sitting with close minguard for lines/safety  Transfers Overall transfer level: Needs assistance Equipment used: 1 person hand held assist Transfers: Sit to/from [facility] Sit to Stand: Min assist;+2 safety/equipment Stand pivot transfers: Min assist;+2  safety/equipment       General transfer comment: steadying assist to rise to standing; use of face to face/HHA to take pivotal steps to [facility] and then to recliner     Balance Overall balance assessment: Needs assistance Sitting-balance support: Feet supported Sitting balance-Leahy Scale: Fair     Standing balance support: Bilateral upper extremity supported Standing balance-Leahy Scale: Poor                             ADL either performed or assessed with clinical judgement   ADL Overall ADL's : Needs assistance/impaired Eating/Feeding: Modified independent;Sitting   Grooming: Wash/dry face;Set up;Min guard;Sitting   Upper Body Bathing: Min guard;Sitting   Lower Body Bathing: Moderate assistance;Sit to/from stand   Upper Body Dressing : Set up;Minimal assistance;Sitting   Lower Body Dressing: Moderate assistance;Sit to/from stand;+2 for safety/equipment   Toilet Transfer: Minimal assistance;+2 for safety/equipment;Stand-pivot;BSC   Toileting- Clothing Manipulation and Hygiene: Moderate assistance;Sitting/lateral lean;Sit to/from stand;+2 for safety/equipment;+2 for physical assistance Toileting - Clothing Manipulation Details (indicate cue type and reason): assist for clothing management (briefs/gown) with minA for standing balance; pt performing peri-care via lateral lean in sitting after voiding bladder     Functional mobility during ADLs: Minimal assistance;+2 for safety/equipment(HHA) General ADL Comments: pt limited due to pain; very emotional throughout                         Pertinent Vitals/Pain Pain Assessment: Faces Faces Pain Scale: Hurts even  more Pain Location: L ribcage Pain Descriptors / Indicators: Discomfort;Grimacing;Crying Pain Intervention(s): Monitored during session;Repositioned;Limited activity within patient's tolerance     Hand Dominance     Extremity/Trunk Assessment Upper Extremity Assessment Upper Extremity  Assessment: Generalized weakness   Lower Extremity Assessment Lower Extremity Assessment: Defer to PT evaluation       Communication Communication Communication: No difficulties   Cognition Arousal/Alertness: Awake/alert Behavior During Therapy: Anxious(VERY emotional) Overall Cognitive Status: Within Functional Limits for tasks assessed                                 General Comments: pt crying and emotionally labile throughout session, reporting strong wishes to go home   General Comments  pt on supplemental O2 (2L); difficult to obtain clear waveform however when able to get accurate wave form SpO2 >92%    Exercises     Shoulder Instructions      Home Living Family/patient expects to be discharged to:: Private residence Living Arrangements: Children Available Help at Discharge: Family;Available PRN/intermittently Type of Home: House Home Access: Stairs to enter [employer] of Steps: 4 Entrance Stairs-Rails: Right;Left;Can reach both Home Layout: One level     Bathroom Shower/Tub: Occupational psychologist: Standard     Home Equipment: Grab bars - tub/shower;Cane - single point;Walker - 2 wheels          Prior Functioning/Environment Level of Independence: Independent        Comments: drives        OT Problem List: Decreased strength;Decreased range of motion;Decreased activity tolerance;Impaired balance (sitting and/or standing);Cardiopulmonary status limiting activity;Decreased knowledge of use of DME or AE;Pain      OT Treatment/Interventions: Self-care/ADL training;Therapeutic exercise;Neuromuscular education;Energy conservation;DME and/or AE instruction;Therapeutic activities;Patient/family education;Balance training    OT Goals(Current goals can be found in the care plan section) Acute Rehab OT Goals Patient Stated Goal: "I want to go home" OT Goal Formulation: With patient Time For Goal Achievement:  11/08/18 Potential to Achieve Goals: Good  OT Frequency: Min 2X/week   Barriers to D/C:            Co-evaluation PT/OT/SLP Co-Evaluation/Treatment: Yes Reason for Co-Treatment: For patient/therapist safety;To address functional/ADL transfers;Complexity of the patient's impairments (multi-system involvement)   OT goals addressed during session: ADL's and self-care      AM-PAC OT "6 Clicks" Daily Activity     Outcome Measure Help from another person eating meals?: None Help from another person taking care of personal grooming?: A Little Help from another person toileting, which includes using toliet, bedpan, or urinal?: A Lot Help from another person bathing (including washing, rinsing, drying)?: A Lot Help from another person to put on and taking off regular upper body clothing?: A Little Help from another person to put on and taking off regular lower body clothing?: A Lot 6 Click Score: 16   End of Session Equipment Utilized During Treatment: Oxygen Nurse Communication: Mobility status  Activity Tolerance: Patient tolerated treatment well Patient left: in chair;with call bell/phone within reach;with chair alarm set;with nursing/sitter in room  OT Visit Diagnosis: Unsteadiness on feet (R26.81);Muscle weakness (generalized) (M62.81);Pain Pain - Right/Left: Left Pain - part of body: (ribcage)                Time: 0258-5277 OT Time Calculation (min): 39 min Charges:  OT General Charges $OT Visit: 1 Visit OT Evaluation $OT Eval Moderate Complexity: 1 Mod OT Treatments $Self  Care/Home Management : 8-22 mins  Lou Cal, OT [facility] Services Pager [phone] Office [phone]   Raymondo Band 10/25/2018, 9:53 AM

## 2018-10-25 NOTE — Evaluation (Signed)
Physical Therapy Evaluation Patient Details Name: Katrina Burnett MRN: [id number] DOB: Jun 24, 1924 Today's Date: 10/25/2018   History of Present Illness  85 y.o. female with a history of chronic low back pain on chronic pain medication, arthritis, GERD who presented the ED today after a fall. Pt sustained L 8-9 rib fx, L PTX.     Clinical Impression  Patient admitted with the above listed diagnosis. Patient reports independence with mobility prior to admission. Patient today very emotional during session with multiple request to return home. Patient requiring overall min A to min guard assist for mobility. Will recommend HHPT with 24/hr supervision at discharge. Will require 3-in-1 at discharge for home use/safety. PT to continue to follow acutely to progress safe and independent functional mobility.      Follow Up Recommendations Home health PT;Supervision/Assistance - 24 hour    Equipment Recommendations  3in1 (PT)    Recommendations for Other Services       Precautions / Restrictions Precautions Precautions: Fall Precaution Comments: chest tube to suction Restrictions Weight Bearing Restrictions: No      Mobility  Bed Mobility Overal bed mobility: Needs Assistance Bed Mobility: Supine to Sit     Supine to sit: Min assist     General bed mobility comments: assist only to scoot hips towards EOB; pt able to transition to sitting with close minguard for lines/safety  Transfers Overall transfer level: Needs assistance Equipment used: 1 person hand held assist Transfers: Sit to/from [facility] Sit to Stand: Min assist;+2 safety/equipment Stand pivot transfers: Min assist;+2 safety/equipment       General transfer comment: steadying assist to rise to standing; use of face to face/HHA to take pivotal steps to [facility] and then to recliner   Ambulation/Gait Ambulation/Gait assistance: Min assist Gait Distance (Feet): 10 Feet Assistive device: 1 person hand held  assist Gait Pattern/deviations: Step-to pattern;Decreased stride length;Shuffle Gait velocity: decreased   General Gait Details: short ambulation distance to recliner from [facility] - B UE support with mild instability  Stairs            Wheelchair Mobility    Modified Rankin (Stroke Patients Only)       Balance Overall balance assessment: Needs assistance Sitting-balance support: Feet supported Sitting balance-Leahy Scale: Fair     Standing balance support: Bilateral upper extremity supported Standing balance-Leahy Scale: Poor                               Pertinent Vitals/Pain Pain Assessment: Faces Faces Pain Scale: Hurts even more Pain Location: L ribcage Pain Descriptors / Indicators: Discomfort;Grimacing;Crying Pain Intervention(s): Limited activity within patient's tolerance;Monitored during session;Repositioned    Home Living Family/patient expects to be discharged to:: Private residence Living Arrangements: Children Available Help at Discharge: Family;Available PRN/intermittently Type of Home: House Home Access: Stairs to enter Entrance Stairs-Rails: Right;Left;Can reach both Entrance Stairs-Number of Steps: 4 Home Layout: One level Home Equipment: Grab bars - tub/shower;Cane - single point;Walker - 2 wheels      Prior Function Level of Independence: Independent         Comments: drives     Hand Dominance        Extremity/Trunk Assessment   Upper Extremity Assessment Upper Extremity Assessment: Defer to OT evaluation    Lower Extremity Assessment Lower Extremity Assessment: Generalized weakness       Communication   Communication: No difficulties  Cognition Arousal/Alertness: Awake/alert Behavior During Therapy: Anxious(VERY emotional)  Overall Cognitive Status: Within Functional Limits for tasks assessed                                 General Comments: pt crying and emotionally labile throughout session,  reporting strong wishes to go home      General Comments General comments (skin integrity, edema, etc.): on supplemental O2 - not a good wave form - generally >90% with mobility    Exercises     Assessment/Plan    PT Assessment Patient needs continued PT services  PT Problem List Decreased strength;Decreased activity tolerance;Decreased balance;Decreased mobility;Decreased knowledge of use of DME;Decreased safety awareness       PT Treatment Interventions DME instruction;Gait training;Stair training;Functional mobility training;Therapeutic activities;Therapeutic exercise;Balance training;Patient/family education    PT Goals (Current goals can be found in the Care Plan section)  Acute Rehab PT Goals Patient Stated Goal: "I want to go home" PT Goal Formulation: With patient Time For Goal Achievement: 11/08/18 Potential to Achieve Goals: Good    Frequency Min 4X/week   Barriers to discharge        Co-evaluation PT/OT/SLP Co-Evaluation/Treatment: Yes Reason for Co-Treatment: Necessary to address cognition/behavior during functional activity;For patient/therapist safety;To address functional/ADL transfers PT goals addressed during session: Mobility/safety with mobility;Balance OT goals addressed during session: ADL's and self-care       AM-PAC PT "6 Clicks" Mobility  Outcome Measure Help needed turning from your back to your side while in a flat bed without using bedrails?: A Little Help needed moving from lying on your back to sitting on the side of a flat bed without using bedrails?: A Little Help needed moving to and from a bed to a chair (including a wheelchair)?: A Little Help needed standing up from a chair using your arms (e.g., wheelchair or bedside chair)?: A Little Help needed to walk in hospital room?: A Little Help needed climbing 3-5 steps with a railing? : A Lot 6 Click Score: 17    End of Session   Activity Tolerance: Patient tolerated treatment  well Patient left: in chair;with call bell/phone within reach;with chair alarm set;with nursing/sitter in room Nurse Communication: Mobility status PT Visit Diagnosis: Unsteadiness on feet (R26.81);Other abnormalities of gait and mobility (R26.89);Muscle weakness (generalized) (M62.81);History of falling (Z91.81)    Time: 1275-1700 PT Time Calculation (min) (ACUTE ONLY): 39 min   Charges:   PT Evaluation $PT Eval Moderate Complexity: 1 Mod          Lanney Gins, PT, DPT Supplemental Physical Therapist 10/25/18 10:00 AM Pager: [phone] Office: [phone]

## 2018-10-25 NOTE — Progress Notes (Addendum)
[facility]/Trauma Progress Note      Assessment/Plan Fall Left 8th and 9th rib fx - Pulm toliet, IS, pain control Left PTX 25-30% - S/P L CT. O2. Pulm toilet, IS, pain control, CXR today showed no PTX, CT to water seal, repeat CXR tomorrow am Right shin abrasion - Tetanus vaccine, local wound care Anemia - Hgb 10.6 today. No hypotension or tachycardia.  C-spine cleared  ID - None VTE - SCDs, Lovenox  FEN - Regular POC - Willeen Cass (Daughter) [phone] [facility] or [phone] (Mobile) & [name] (son) [phone] (mobile)   Plan: CT to H2O seal, PT/OT, pain control.    LOS: 1 day    Subjective: CC: Rib pain  Pt is having significant rib pain and is crying. She states she just wants to go home. No issues overnight. She lives with her son who she states could help take care of her. She takes percocet for back pain. Spoke with daughter on phone.   Objective: Vital signs in last 24 hours: Temp:  [97.6 F (36.4 C)-98.4 F (36.9 C)] 97.8 F (36.6 C) (06/17 0758) Pulse Rate:  [72-92] 72 (06/17 0758) Resp:  [14-25] 18 (06/17 0758) BP: (126-181)/(76-93) 131/78 (06/17 0758) SpO2:  [88 %-100 %] 97 % (06/17 0758)    Intake/Output from previous day: 06/16 0701 - 06/17 0700 In: 1000 [I.V.:1000] Out: -  Intake/Output this shift: No intake/output data recorded.  PE: Gen:  Alert, NAD, pleasant, crying Card:  RRR, no M/G/R heard Pulm:  CTA, no W/R/R, rate and effort normal, CT without air leak Skin: no rashes noted, warm and dry Extremities: moves all 4's, no LE edema Neuro: no gross motor or sensory deficits. A&O   Anti-infectives: Anti-infectives (From admission, onward)   None      Lab Results:  Recent Labs    10/24/18 1522 10/25/18 0332  WBC 14.7* 9.0  HGB 11.7* 10.6*  HCT 37.6 34.6*  PLT 317 313   BMET Recent Labs    10/24/18 1522 10/25/18 0332  NA 139 137  K 4.0 4.5  CL 106 108  CO2 23 23  GLUCOSE 106* 99  BUN 23 16   CREATININE 0.91 0.73  CALCIUM 9.2 8.9   PT/INR No results for input(s): LABPROT, INR in the last 72 hours. CMP     Component Value Date/Time   NA 137 10/25/2018 0332   K 4.5 10/25/2018 0332   CL 108 10/25/2018 0332   CO2 23 10/25/2018 0332   GLUCOSE 99 10/25/2018 0332   BUN 16 10/25/2018 0332   CREATININE 0.73 10/25/2018 0332   CALCIUM 8.9 10/25/2018 0332   PROT 5.4 (L) 05/27/2015 0615   ALBUMIN 2.5 (L) 05/27/2015 0615   AST 36 05/27/2015 0615   ALT 22 05/27/2015 0615   ALKPHOS 65 05/27/2015 0615   BILITOT 0.4 05/27/2015 0615   GFRNONAA >60 10/25/2018 0332   GFRAA >60 10/25/2018 0332   Lipase     Component Value Date/Time   LIPASE 35 05/30/2012 1450    Studies/Results: Ct Chest Wo Contrast  Result Date: 10/24/2018 CLINICAL DATA:  Slip and fall on a porch approximately 2 hours earlier today. Chest pain. Question rib fracture. Initial encounter. EXAM: CT CHEST WITHOUT CONTRAST TECHNIQUE: Multidetector CT imaging of the chest was performed following the standard protocol without IV contrast. COMPARISON:  None. FINDINGS: Cardiovascular: Heart size is upper normal. No pericardial effusion. Scattered atherosclerotic calcifications noted. Mediastinum/Nodes: No enlarged mediastinal or axillary lymph nodes. Thyroid gland, trachea, and  esophagus demonstrate no significant findings. Lungs/Pleura: The patient has a left pneumothorax estimated at 25-30%. The right lung is expanded. Mild atelectasis on the left is identified. There is some basilar scar or linear atelectasis. No nodule, mass or consolidative process. Small left pleural effusion noted. No right effusion. Upper Abdomen: Negative. Musculoskeletal: Acute left eighth and ninth rib fractures are identified. The patient has remote T2, T6, T8, T9, L1 and L2 fractures. IMPRESSION: Left pneumothorax estimated at 25-30% with an associated small left effusion. Acute fractures of the posterior arcs of the left eighth and ninth ribs. Remote  thoracic spine fractures. Critical Value/emergent results were called by telephone at the time of interpretation on 10/24/2018 at 3:04 pm to Dr. Deno Etienne , who verbally acknowledged these results. Aortic Atherosclerosis (ICD10-I70.0). Electronically Signed   By: Inge Rise M.D.   On: 10/24/2018 15:06   Ct Cervical Spine Wo Contrast  Result Date: 10/24/2018 CLINICAL DATA:  Pain status post fall EXAM: CT CERVICAL SPINE WITHOUT CONTRAST TECHNIQUE: Multidetector CT imaging of the cervical spine was performed without intravenous contrast. Multiplanar CT image reconstructions were also generated. COMPARISON:  None. FINDINGS: Alignment: Normal Skull base and vertebrae: No acute fracture. No primary bone lesion or focal pathologic process. Soft tissues and spinal canal: No prevertebral fluid or swelling. No visible canal hematoma. Disc levels: Mild-to-moderate multilevel disc height loss is noted, greatest at the lower cervical segments. Upper chest: Again noted is a left-sided pneumothorax. There is airspace opacity at the right lung apex measuring approximately 1.7 cm. The appearance favors scarring. Other: None. IMPRESSION: 1. No acute cervical spine fracture. 2. Again noted is a partially visualized left-sided pneumothorax. This is better visualized on the patient's recent CT of the chest. Electronically Signed   By: Constance Holster M.D.   On: 10/24/2018 15:26   Dg Chest Port 1 View  Result Date: 10/25/2018 CLINICAL DATA:  Follow-up left pneumothorax. EXAM: PORTABLE CHEST 1 VIEW COMPARISON:  October 24, 2018 FINDINGS: Left chest tube remains in place. No pneumothorax identified. Opacity associated with a small effusion left base is stable. No change in the cardiomediastinal silhouette. IMPRESSION: 1. The left chest tube remains in place with no pneumothorax. 2. A small left effusion with underlying atelectasis is identified. Electronically Signed   By: Dorise Bullion III M.D   On: 10/25/2018 09:22   Dg  Chest Port 1 View  Result Date: 10/24/2018 CLINICAL DATA:  Chest tube placement. EXAM: PORTABLE CHEST 1 VIEW COMPARISON:  CT chest from same day. Chest x-ray dated December 09, 2016. FINDINGS: Interval placement of a left-sided chest tube. No visible residual left pneumothorax. Unchanged small left pleural effusion. The right lung is clear. The heart size and mediastinal contours are within normal limits. Normal pulmonary vascularity. Known left-sided rib fractures are better evaluated on CT from same day. Multiple chronic thoracic compression deformities, some of which have undergone kyphoplasty. IMPRESSION: 1. Interval left-sided chest tube placement. No visible residual left pneumothorax. 2. Unchanged small left pleural effusion. Electronically Signed   By: Titus Dubin M.D.   On: 10/24/2018 18:10      Kalman Drape , [facility] 10/25/2018, 9:36 AM  Pager: [phone] Mon-Wed, Friday 7:00am-4:30pm Thurs 7am-11:30am  Consults: [phone]   Very anxious, but clinically improved Chest tube to water seal  Imogene Burn. Georgette Dover, MD, [facility] Beeper [phone]  10/25/2018 9:52 AM

## 2018-10-25 NOTE — Plan of Care (Signed)
  Problem: Education: Goal: Knowledge of General Education information will improve Description: Including pain rating scale, medication(s)/side effects and non-pharmacologic comfort measures Outcome: Progressing   Problem: Health Behavior/Discharge Planning: Goal: Ability to manage health-related needs will improve Outcome: Progressing   Problem: Clinical Measurements: Goal: Ability to maintain clinical measurements within normal limits will improve Outcome: Progressing Goal: Will remain free from infection Outcome: Progressing Goal: Respiratory complications will improve Outcome: Progressing Goal: Cardiovascular complication will be avoided Outcome: Progressing   Problem: Coping: Goal: Level of anxiety will decrease Outcome: Progressing   Problem: Pain Managment: Goal: General experience of comfort will improve Outcome: Progressing   Problem: Safety: Goal: Ability to remain free from injury will improve Outcome: Progressing   Problem: Skin Integrity: Goal: Risk for impaired skin integrity will decrease Outcome: Progressing

## 2018-10-26 ENCOUNTER — Inpatient Hospital Stay (HOSPITAL_COMMUNITY): Payer: Medicare Other

## 2018-10-26 LAB — CBC
HCT: 32.6 % — ABNORMAL LOW (ref 36.0–46.0)
Hemoglobin: 9.9 g/dL — ABNORMAL LOW (ref 12.0–15.0)
MCH: 27.3 pg (ref 26.0–34.0)
MCHC: 30.4 g/dL (ref 30.0–36.0)
MCV: 90.1 fL (ref 80.0–100.0)
Platelets: 260 10*3/uL (ref 150–400)
RBC: 3.62 MIL/uL — ABNORMAL LOW (ref 3.87–5.11)
RDW: 14.2 % (ref 11.5–15.5)
WBC: 7.9 10*3/uL (ref 4.0–10.5)
nRBC: 0 % (ref 0.0–0.2)

## 2018-10-26 MED ORDER — ALUM & MAG HYDROXIDE-SIMETH 200-200-20 MG/5ML PO SUSP
30.0000 mL | Freq: Four times a day (QID) | ORAL | Status: DC | PRN
  Administered 2018-10-26 – 2018-10-28 (×4): 30 mL via ORAL
  Filled 2018-10-26 (×3): qty 30

## 2018-10-26 NOTE — Progress Notes (Signed)
Occupational Therapy Treatment Patient Details Name: Katrina Burnett MRN: [id number] DOB: Jun 22, 1924 Today's Date: 10/26/2018    History of present illness 85 y.o. female with a history of chronic low back pain on chronic pain medication, arthritis, GERD who presented the ED today after a fall. Pt sustained L 8-9 rib fx, L PTX.    OT comments  Pt with improved cognition and pain management today. Pt continues to struggle with LB ADL due to pain, improved standing balance for standing grooming at sink and hallway ambulation with chair follow for safety. Current plan remains appropriate as Pt will benefit from continued OT acutely and at the [facility] level for safety and independence.    Follow Up Recommendations  Home health OT;Supervision/Assistance - 24 hour(HH Aide)    Equipment Recommendations  3 in 1 bedside commode    Recommendations for Other Services      Precautions / Restrictions Precautions Precautions: Fall Precaution Comments: chest tube Restrictions Weight Bearing Restrictions: No       Mobility Bed Mobility               General bed mobility comments: OOB in recliner at beginning and end of session  Transfers Overall transfer level: Needs assistance Equipment used: Rolling walker (2 wheeled) Transfers: Sit to/from Stand Sit to Stand: Min assist         General transfer comment: steadying assist to rise to standing, vc for safe hand placement with RW    Balance Overall balance assessment: Needs assistance Sitting-balance support: Feet supported Sitting balance-Leahy Scale: Fair     Standing balance support: Bilateral upper extremity supported Standing balance-Leahy Scale: Poor Standing balance comment: approaching fair for static standing, poor for dynamic                           ADL either performed or assessed with clinical judgement   ADL Overall ADL's : Needs assistance/impaired     Grooming: Wash/dry hands;Min  guard;Standing Grooming Details (indicate cue type and reason): sink level         Upper Body Dressing : Set up;Minimal assistance;Sitting Upper Body Dressing Details (indicate cue type and reason): to don second gown Lower Body Dressing: Moderate assistance;Sitting/lateral leans Lower Body Dressing Details (indicate cue type and reason): unable to perform don/doff of sock, can get off but not back on. Pt's son will be able to assist Toilet Transfer: Minimal assistance;Ambulation;RW;Regular Toilet   Toileting- Clothing Manipulation and Hygiene: Minimal assistance;Sit to/from stand       Functional mobility during ADLs: Minimal assistance;Rolling walker General ADL Comments: improved cognition and no crying today. Pt limited access to LB for ADL due to rib pain      Vision       Perception     Praxis      Cognition Arousal/Alertness: Awake/alert Behavior During Therapy: WFL for tasks assessed/performed Overall Cognitive Status: Within Functional Limits for tasks assessed                                          Exercises     Shoulder Instructions       General Comments very difficult to get O2 reading - using clinical judgement Pt with no signs or symptoms of distress    Pertinent Vitals/ Pain       Pain Assessment: Faces Faces Pain Scale:  Hurts even more Pain Location: L ribcage Pain Descriptors / Indicators: Discomfort;Grimacing Pain Intervention(s): Limited activity within patient's tolerance;Monitored during session;Repositioned  Home Living                                          Prior Functioning/Environment              Frequency  Min 2X/week        Progress Toward Goals  OT Goals(current goals can now be found in the care plan section)  Progress towards OT goals: Progressing toward goals  Acute Rehab OT Goals Patient Stated Goal: "I want to go home" OT Goal Formulation: With patient Time For Goal  Achievement: 11/08/18 Potential to Achieve Goals: Good  Plan Discharge plan remains appropriate;Frequency remains appropriate    Co-evaluation    PT/OT/SLP Co-Evaluation/Treatment: Yes Reason for Co-Treatment: For patient/therapist safety;To address functional/ADL transfers PT goals addressed during session: Mobility/safety with mobility;Balance;Proper use of DME OT goals addressed during session: ADL's and self-care;Proper use of Adaptive equipment and DME      AM-PAC OT "6 Clicks" Daily Activity     Outcome Measure   Help from another person eating meals?: None Help from another person taking care of personal grooming?: A Little Help from another person toileting, which includes using toliet, bedpan, or urinal?: A Lot Help from another person bathing (including washing, rinsing, drying)?: A Lot Help from another person to put on and taking off regular upper body clothing?: A Little Help from another person to put on and taking off regular lower body clothing?: A Lot 6 Click Score: 16    End of Session Equipment Utilized During Treatment: Rolling walker  OT Visit Diagnosis: Unsteadiness on feet (R26.81);Muscle weakness (generalized) (M62.81);Pain Pain - Right/Left: Left Pain - part of body: (chest tube insertion point)   Activity Tolerance Patient tolerated treatment well   Patient Left in chair;with call bell/phone within reach;with chair alarm set   Nurse Communication Mobility status        Time: 1610-9604 OT Time Calculation (min): 24 min  Charges: OT General Charges $OT Visit: 1 Visit OT Treatments $Self Care/Home Management : 8-22 mins  Hulda Humphrey OTR/L Acute Rehabilitation Services Pager: [phone] Office: [place] 10/26/2018, 12:29 PM

## 2018-10-26 NOTE — TOC Progression Note (Signed)
Transition of Care [facility]) - Progression Note    Patient Details  Name: Katrina Burnett MRN: [id number] Date of Birth: 05-07-1925  Transition of Care [facility]) CM/SW [place], [place] Phone Number: 10/26/2018, 2:36 PM  Clinical Narrative:    SBIRT complete, pt denies any ETOH or other substance use before her fall. She is aware of [place] set up by [facility] and looking forward to going home to see her dog.    Expected Discharge Plan: [place]    Expected Discharge Plan and Services Expected Discharge Plan: [place]     Post Acute Care Choice: Home Health, Durable Medical Equipment                   DME Arranged: 3-N-1 DME Agency: [employer] Date DME Agency Contacted: 10/26/18 Time DME Agency Contacted: 1122 Representative spoke with at DME Agency: [place]: PT, OT, Nurse's Aide [place] Agency: [place] Date [place]: 10/26/18 Time [place]: 1122 Representative spoke with at [place]: [place] ([place]) Interventions    Readmission Risk Interventions No flowsheet data found.

## 2018-10-26 NOTE — TOC Initial Note (Addendum)
Transition of Care [facility]) - Initial/Assessment Note    Patient Details  Name: Katrina Burnett MRN: [id number] Date of Birth: 1924/09/29  Transition of Care [facility]) CM/SW Contact:    Maryclare Labrador, RN Phone Number: 10/26/2018, 11:28 AM  Clinical Narrative:     PTA from home with son - pt informed CM that son will provide 24 hour supervision .  Pt has PCP and denied barriers with paying for prescriptions.  Pt is interested in HH/DME as ordered - CM provided medicare.gov list verbally - pt chose [facility] for DME and [facility] for [facility] - both agencies accepted referral              Expected Discharge Plan: [place]     Patient Goals and CMS Choice Patient states their goals for this hospitalization and ongoing recovery are:: Pt informed CM that she is ready to get out of the hosptal so she doesnt get sicker CMS Medicare.gov Compare Post Acute Care list provided to:: Patient Choice offered to / list presented to : Patient  Expected Discharge Plan and Services Expected Discharge Plan: [place] Choice: Home Health, Durable Medical Equipment                   DME Arranged: 3-N-1 DME Agency: [facility] Date DME Agency Contacted: 10/26/18 Time DME Agency Contacted: 1122 Representative spoke with at DME Agency: [place]: PT, OT, Nurse's Aide [place] Agency: [place] Date [place]: 10/26/18 Time [place]: 1122 Representative spoke with at [place] Arrangements/Services   Lives with:: Adult Children Patient language and need for interpreter reviewed:: Yes Do you feel safe going back to the place where you live?: Yes      Need for Family Participation in Patient Care: Yes (Comment) Care giver support system in place?: Yes (comment)   Criminal Activity/Legal Involvement Pertinent to Current Situation/Hospitalization: No - Comment as needed  Activities of Daily  Living Home Assistive Devices/Equipment: None ADL Screening (condition at time of admission) Patient's cognitive ability adequate to safely complete daily activities?: Yes Is the patient deaf or have difficulty hearing?: No Does the patient have difficulty seeing, even when wearing glasses/contacts?: No Does the patient have difficulty concentrating, remembering, or making decisions?: No Patient able to express need for assistance with ADLs?: Yes Does the patient have difficulty dressing or bathing?: No Independently performs ADLs?: No Communication: Independent Dressing (OT): Needs assistance Is this a change from baseline?: Change from baseline, expected to last <3days Grooming: Independent Feeding: Independent Bathing: Needs assistance Is this a change from baseline?: Change from baseline, expected to last <3 days Toileting: Needs assistance Is this a change from baseline?: Change from baseline, expected to last <3 days In/Out Bed: Needs assistance Is this a change from baseline?: Change from baseline, expected to last <3 days Walks in Home: Needs assistance Is this a change from baseline?: Change from baseline, expected to last <3 days Does the patient have difficulty walking or climbing stairs?: No Weakness of Legs: None Weakness of Arms/Hands: None  Permission Sought/Granted         Permission granted to share info w AGENCY: [facility], [facility]        Emotional Assessment   Attitude/Demeanor/Rapport: Self-Confident, Engaged, Gracious Affect (typically observed): Accepting, [facility] Orientation: : Oriented to Self, Oriented to Place, Oriented to  Time, Oriented to Situation   Psych Involvement:  No (comment)  Admission diagnosis:  Pneumothorax on left [J93.9] Closed fracture of multiple ribs of left side, initial encounter [S22.42XA] Patient Active Problem List   Diagnosis Date Noted  . Pneumothorax on left 10/24/2018  . Pneumothorax, left 10/24/2018  . CAP (community  acquired pneumonia) 11/25/2016  . Anemia 11/25/2016  . Hyponatremia 11/25/2016  . Fracture, intertrochanteric, right femur ([place]) 05/26/2015  . Closed fracture of femur ([place])   . Post-operative pain   . Acute blood loss anemia   . Adjustment disorder with mixed anxiety and depressed mood   . Fall   . Abnormality of gait   . Femur fracture, right, closed, initial encounter 05/23/2015  . Fracture of femoral neck, right, closed ([place]) 05/22/2015  . Femoral neck fracture, right, closed, initial encounter 05/22/2015  . Syncope 05/30/2012  . Nausea & vomiting 05/30/2012  . Chest pain 05/30/2012  . Hypotension 05/30/2012  . Esophageal reflux 12/16/2011  . Lumbar compression fracture ([place])   . Polymyalgia ([place])    PCP:  Jani Gravel, MD Pharmacy:   [address] [facility], [place] [facility] [address] [place] [address] Phone: [phone] Fax: [phone]  [id number] - [place], [place]. AT [address] [place] [address] Phone: [phone] Fax: [phone]     Social Determinants of Health (SDOH) Interventions    Readmission Risk Interventions No flowsheet data found.

## 2018-10-26 NOTE — Progress Notes (Signed)
Physical Therapy Treatment Patient Details Name: BARI LEIB MRN: [id number] DOB: Feb 24, 1925 Today's Date: 10/26/2018    History of Present Illness 85 y.o. female with a history of chronic low back pain on chronic pain medication, arthritis, GERD who presented the ED today after a fall. Pt sustained L 8-9 rib fx, L PTX.     PT Comments    Patient with improved activity tolerance today. Does still reports considerable amount of pain at ribs with coughing/mobility. Progressive gait distance with RW this session with up to light Min A for steadying. Will continue to recommend HHPT at discharge. Will continue to follow.    Follow Up Recommendations  Home health PT;Supervision/Assistance - 24 hour     Equipment Recommendations  3in1 (PT)    Recommendations for Other Services       Precautions / Restrictions Precautions Precautions: Fall Precaution Comments: chest tube Restrictions Weight Bearing Restrictions: No    Mobility  Bed Mobility               General bed mobility comments: OOB in recliner at beginning and end of session  Transfers Overall transfer level: Needs assistance Equipment used: Rolling walker (2 wheeled) Transfers: Sit to/from Stand Sit to Stand: Min assist         General transfer comment: steadying assist to rise to standing, vc for safe hand placement with RW  Ambulation/Gait Ambulation/Gait assistance: Min guard;Min assist Gait Distance (Feet): 250 Feet Assistive device: Rolling walker (2 wheeled) Gait Pattern/deviations: Step-through pattern;Decreased stride length;Trunk flexed     General Gait Details: ambulation in hallway with RW - up to Min A at time for light steadying assist; improved tolerance    Stairs             Wheelchair Mobility    Modified Rankin (Stroke Patients Only)       Balance Overall balance assessment: Needs assistance Sitting-balance support: Feet supported Sitting balance-Leahy Scale: Fair      Standing balance support: Bilateral upper extremity supported Standing balance-Leahy Scale: Poor Standing balance comment: approaching fair for static standing, poor for dynamic                            Cognition Arousal/Alertness: Awake/alert Behavior During Therapy: WFL for tasks assessed/performed Overall Cognitive Status: Within Functional Limits for tasks assessed                                        Exercises      General Comments General comments (skin integrity, edema, etc.): ambulation on RA - poor O2 reading - no S?S of SOB/distress      Pertinent Vitals/Pain Pain Assessment: Faces Faces Pain Scale: Hurts even more Pain Location: L ribcage Pain Descriptors / Indicators: Discomfort;Grimacing Pain Intervention(s): Limited activity within patient's tolerance;Monitored during session;Repositioned    Home Living                      Prior Function            PT Goals (current goals can now be found in the care plan section) Acute Rehab PT Goals Patient Stated Goal: "I want to go home" PT Goal Formulation: With patient Time For Goal Achievement: 11/08/18 Potential to Achieve Goals: Good Progress towards PT goals: Progressing toward goals    Frequency    Min  4X/week      PT Plan Current plan remains appropriate    Co-evaluation PT/OT/SLP Co-Evaluation/Treatment: Yes Reason for Co-Treatment: For patient/therapist safety;To address functional/ADL transfers PT goals addressed during session: Mobility/safety with mobility;Balance;Proper use of DME;Strengthening/ROM OT goals addressed during session: ADL's and self-care;Proper use of [facility] equipment and DME      AM-PAC PT "6 Clicks" Mobility   Outcome Measure  Help needed turning from your back to your side while in a flat bed without using bedrails?: A Little Help needed moving from lying on your back to sitting on the side of a flat bed without using bedrails?:  A Little Help needed moving to and from a bed to a chair (including a wheelchair)?: A Little Help needed standing up from a chair using your arms (e.g., wheelchair or bedside chair)?: A Little Help needed to walk in hospital room?: A Little Help needed climbing 3-5 steps with a railing? : A Little 6 Click Score: 18    End of Session Equipment Utilized During Treatment: Gait belt Activity Tolerance: Patient tolerated treatment well Patient left: in chair;with call bell/phone within reach;with chair alarm set Nurse Communication: Mobility status PT Visit Diagnosis: Unsteadiness on feet (R26.81);Other abnormalities of gait and mobility (R26.89);Muscle weakness (generalized) (M62.81);History of falling (Z91.81)     Time: 7035-0093 PT Time Calculation (min) (ACUTE ONLY): 24 min  Charges:  $Gait Training: 8-22 mins           Lanney Gins, PT, DPT Supplemental Physical Therapist 10/26/18 12:48 PM Pager: [phone] Office: [phone]

## 2018-10-26 NOTE — Progress Notes (Signed)
[facility]/Trauma Progress Note      Assessment/Plan Fall Left 8th and 9th rib fx- Pulm toliet, IS, pain control Left PTX 25-30% - S/P L CT. O2. Pulm toilet, IS, pain control, CXR today showed no PTX, CT to water seal, repeat CXR tomorrow am Right shin abrasion - Tetanus vaccine, local wound care Anemia - Hgb 9.9 today. Stable. No hypotension or tachycardia.  C-spine cleared  ID -None VTE -SCDs, Lovenox FEN -Regular POC - Katrina Burnett (Daughter) [phone] [facility]) or [phone] ([place]) & Katrina Burnett (son) [phone] (mobile)   Plan: CT to H2O seal with repeat chest xray tomorrow morning, PT/OT, pain control.    LOS: 2 days    Subjective: CC: rib fracture  No new CP, cough, fever, or SOB. She walked with therapies this am. She has restless leg syndrome and her legs are bothering her this am. Otherwise no other complaints.   Per nurse, CT was not placed to H2O seal yesterday per order so she placed it on water seal this am.   Objective: Vital signs in last 24 hours: Temp:  [97.5 F (36.4 C)-99.4 F (37.4 C)] 98.4 F (36.9 C) (06/18 0743) Pulse Rate:  [34-95] 75 (06/18 0326) Resp:  [14-20] 17 (06/18 0743) BP: (92-178)/(55-83) 164/77 (06/18 0743) SpO2:  [95 %-100 %] 98 % (06/18 0743) Last BM Date: 10/23/18  Intake/Output from previous day: 06/17 0701 - 06/18 0700 In: 2043.5 [P.O.:960; I.V.:1083.5] Out: 40 [Chest Tube:40] Intake/Output this shift: Total I/O In: 1139.3 [I.V.:1139.3] Out: 24 [Chest Tube:24]  PE: Gen:  Alert, NAD, pleasant Card:  regular rate, irregular rhythm, no M/G/R heard Pulm:  mildly diminished breath sounds L base, no W/R/R, rate and effort normal, CT without air leak Skin: no rashes noted, warm and dry Extremities: moves all 4's, no LE edema Neuro: no gross motor or sensory deficits. A&O   Anti-infectives: Anti-infectives (From admission, onward)   None      Lab Results:  Recent Labs    10/25/18 0332  10/26/18 0452  WBC 9.0 7.9  HGB 10.6* 9.9*  HCT 34.6* 32.6*  PLT 313 260   BMET Recent Labs    10/24/18 1522 10/25/18 0332  NA 139 137  K 4.0 4.5  CL 106 108  CO2 23 23  GLUCOSE 106* 99  BUN 23 16  CREATININE 0.91 0.73  CALCIUM 9.2 8.9   PT/INR No results for input(s): LABPROT, INR in the last 72 hours. CMP     Component Value Date/Time   NA 137 10/25/2018 0332   K 4.5 10/25/2018 0332   CL 108 10/25/2018 0332   CO2 23 10/25/2018 0332   GLUCOSE 99 10/25/2018 0332   BUN 16 10/25/2018 0332   CREATININE 0.73 10/25/2018 0332   CALCIUM 8.9 10/25/2018 0332   PROT 5.4 (L) 05/27/2015 0615   ALBUMIN 2.5 (L) 05/27/2015 0615   AST 36 05/27/2015 0615   ALT 22 05/27/2015 0615   ALKPHOS 65 05/27/2015 0615   BILITOT 0.4 05/27/2015 0615   GFRNONAA >60 10/25/2018 0332   GFRAA >60 10/25/2018 0332   Lipase     Component Value Date/Time   LIPASE 35 05/30/2012 1450    Studies/Results: Ct Chest Wo Contrast  Result Date: 10/24/2018 CLINICAL DATA:  Slip and fall on a porch approximately 2 hours earlier today. Chest pain. Question rib fracture. Initial encounter. EXAM: CT CHEST WITHOUT CONTRAST TECHNIQUE: Multidetector CT imaging of the chest was performed following the standard protocol without IV contrast. COMPARISON:  None.  FINDINGS: Cardiovascular: Heart size is upper normal. No pericardial effusion. Scattered atherosclerotic calcifications noted. Mediastinum/Nodes: No enlarged mediastinal or axillary lymph nodes. Thyroid gland, trachea, and esophagus demonstrate no significant findings. Lungs/Pleura: The patient has a left pneumothorax estimated at 25-30%. The right lung is expanded. Mild atelectasis on the left is identified. There is some basilar scar or linear atelectasis. No nodule, mass or consolidative process. Small left pleural effusion noted. No right effusion. Upper Abdomen: Negative. Musculoskeletal: Acute left eighth and ninth rib fractures are identified. The patient has  remote T2, T6, T8, T9, L1 and L2 fractures. IMPRESSION: Left pneumothorax estimated at 25-30% with an associated small left effusion. Acute fractures of the posterior arcs of the left eighth and ninth ribs. Remote thoracic spine fractures. Critical Value/emergent results were called by telephone at the time of interpretation on 10/24/2018 at 3:04 pm to Dr. Deno Etienne , who verbally acknowledged these results. Aortic Atherosclerosis (ICD10-I70.0). Electronically Signed   By: Inge Rise M.D.   On: 10/24/2018 15:06   Ct Cervical Spine Wo Contrast  Result Date: 10/24/2018 CLINICAL DATA:  Pain status post fall EXAM: CT CERVICAL SPINE WITHOUT CONTRAST TECHNIQUE: Multidetector CT imaging of the cervical spine was performed without intravenous contrast. Multiplanar CT image reconstructions were also generated. COMPARISON:  None. FINDINGS: Alignment: Normal Skull base and vertebrae: No acute fracture. No primary bone lesion or focal pathologic process. Soft tissues and spinal canal: No prevertebral fluid or swelling. No visible canal hematoma. Disc levels: Mild-to-moderate multilevel disc height loss is noted, greatest at the lower cervical segments. Upper chest: Again noted is a left-sided pneumothorax. There is airspace opacity at the right lung apex measuring approximately 1.7 cm. The appearance favors scarring. Other: None. IMPRESSION: 1. No acute cervical spine fracture. 2. Again noted is a partially visualized left-sided pneumothorax. This is better visualized on the patient's recent CT of the chest. Electronically Signed   By: Constance Holster M.D.   On: 10/24/2018 15:26   Dg Chest Port 1 View  Result Date: 10/26/2018 CLINICAL DATA:  Chest tube in place EXAM: PORTABLE CHEST 1 VIEW COMPARISON:  10/25/2018 FINDINGS: Stable AP portable examination with a left-sided pigtail chest tube in position. No significant pneumothorax is appreciated. There are persistent small bilateral pleural effusions and  associated atelectasis. No new airspace opacity. Cardiomegaly. IMPRESSION: Stable AP portable examination with a left-sided pigtail chest tube in position. No significant pneumothorax is appreciated. There are persistent small bilateral pleural effusions and associated atelectasis. No new airspace opacity. Cardiomegaly. Electronically Signed   By: Eddie Candle M.D.   On: 10/26/2018 09:44   Dg Chest Port 1 View  Result Date: 10/25/2018 CLINICAL DATA:  Follow-up left pneumothorax. EXAM: PORTABLE CHEST 1 VIEW COMPARISON:  October 24, 2018 FINDINGS: Left chest tube remains in place. No pneumothorax identified. Opacity associated with a small effusion left base is stable. No change in the cardiomediastinal silhouette. IMPRESSION: 1. The left chest tube remains in place with no pneumothorax. 2. A small left effusion with underlying atelectasis is identified. Electronically Signed   By: Dorise Bullion III M.D   On: 10/25/2018 09:22   Dg Chest Port 1 View  Result Date: 10/24/2018 CLINICAL DATA:  Chest tube placement. EXAM: PORTABLE CHEST 1 VIEW COMPARISON:  CT chest from same day. Chest x-ray dated December 09, 2016. FINDINGS: Interval placement of a left-sided chest tube. No visible residual left pneumothorax. Unchanged small left pleural effusion. The right lung is clear. The heart size and mediastinal contours are within  normal limits. Normal pulmonary vascularity. Known left-sided rib fractures are better evaluated on CT from same day. Multiple chronic thoracic compression deformities, some of which have undergone kyphoplasty. IMPRESSION: 1. Interval left-sided chest tube placement. No visible residual left pneumothorax. 2. Unchanged small left pleural effusion. Electronically Signed   By: Titus Dubin M.D.   On: 10/24/2018 18:10      Kalman Drape , [facility] Surgery 10/26/2018, 10:38 AM  Pager: [phone] Mon-Wed, Friday 7:00am-4:30pm Thurs 7am-11:30am  Consults: [phone]

## 2018-10-27 ENCOUNTER — Inpatient Hospital Stay (HOSPITAL_COMMUNITY): Payer: Medicare Other

## 2018-10-27 LAB — CBC
HCT: 33 % — ABNORMAL LOW (ref 36.0–46.0)
Hemoglobin: 10.3 g/dL — ABNORMAL LOW (ref 12.0–15.0)
MCH: 27.7 pg (ref 26.0–34.0)
MCHC: 31.2 g/dL (ref 30.0–36.0)
MCV: 88.7 fL (ref 80.0–100.0)
Platelets: 278 10*3/uL (ref 150–400)
RBC: 3.72 MIL/uL — ABNORMAL LOW (ref 3.87–5.11)
RDW: 14.2 % (ref 11.5–15.5)
WBC: 7.5 10*3/uL (ref 4.0–10.5)
nRBC: 0 % (ref 0.0–0.2)

## 2018-10-27 MED ORDER — OXYCODONE HCL 5 MG PO TABS
10.0000 mg | ORAL_TABLET | ORAL | Status: DC | PRN
  Administered 2018-10-27 – 2018-10-28 (×2): 15 mg via ORAL
  Filled 2018-10-27 (×3): qty 3

## 2018-10-27 MED ORDER — WHITE PETROLATUM EX OINT
TOPICAL_OINTMENT | CUTANEOUS | Status: AC
  Administered 2018-10-27: 0.2
  Filled 2018-10-27: qty 28.35

## 2018-10-27 MED ORDER — TRAMADOL HCL 50 MG PO TABS
50.0000 mg | ORAL_TABLET | Freq: Two times a day (BID) | ORAL | Status: DC
  Administered 2018-10-27 – 2018-10-28 (×3): 50 mg via ORAL
  Filled 2018-10-27 (×3): qty 1

## 2018-10-27 MED ORDER — METHOCARBAMOL 500 MG PO TABS
500.0000 mg | ORAL_TABLET | Freq: Three times a day (TID) | ORAL | Status: DC
  Administered 2018-10-27 – 2018-10-28 (×4): 500 mg via ORAL
  Filled 2018-10-27 (×3): qty 1

## 2018-10-27 MED ORDER — SIMETHICONE 80 MG PO CHEW
80.0000 mg | CHEWABLE_TABLET | Freq: Four times a day (QID) | ORAL | Status: DC
  Administered 2018-10-27 – 2018-10-28 (×2): 80 mg via ORAL
  Filled 2018-10-27 (×3): qty 1

## 2018-10-27 MED ORDER — BISACODYL 10 MG RE SUPP
10.0000 mg | Freq: Once | RECTAL | Status: AC
  Administered 2018-10-27: 10 mg via RECTAL
  Filled 2018-10-27: qty 1

## 2018-10-27 MED ORDER — SACCHAROMYCES BOULARDII 250 MG PO CAPS
250.0000 mg | ORAL_CAPSULE | Freq: Two times a day (BID) | ORAL | Status: DC
  Administered 2018-10-27 – 2018-10-28 (×3): 250 mg via ORAL
  Filled 2018-10-27 (×3): qty 1

## 2018-10-27 MED ORDER — GUAIFENESIN ER 600 MG PO TB12
600.0000 mg | ORAL_TABLET | Freq: Two times a day (BID) | ORAL | Status: DC
  Administered 2018-10-27 – 2018-10-28 (×3): 600 mg via ORAL
  Filled 2018-10-27 (×3): qty 1

## 2018-10-27 NOTE — Progress Notes (Signed)
PT Cancellation Note  Patient Details Name: Katrina Burnett MRN: [id number] DOB: 05-11-1924   Cancelled Treatment:    Reason Eval/Treat Not Completed: Patient declined, no reason specified;Pain limiting ability to participate.  Pt not feeling up for OOB mobility as she is reporting sternal pain (she reports it is indigestion).  RN made aware via call bell.  PT will attempt again tomorrow.   Thanks,  Barbarann Ehlers. Vuong Musa, PT, DPT  Acute Rehabilitation [phone] pager [phone] office  @ [facility]: [phone]    Harvie Heck 10/27/2018, 6:39 PM

## 2018-10-27 NOTE — Discharge Instructions (Signed)
PNEUMOTHORAX OR HEMOTHORAX +/- RIB FRACTURES  HOME INSTRUCTIONS   1. PAIN CONTROL:  1. Pain is best controlled by a usual combination of three different methods TOGETHER:  i. Ice/Heat ii. Over the counter pain medication iii. Prescription pain medication 2. You may experience some swelling and bruising in area of broken ribs. Ice packs or heating pads (30-60 minutes up to 6 times a day) will help. Use ice for the first few days to help decrease swelling and bruising, then switch to heat to help relax tight/sore spots and speed recovery. Some people prefer to use ice alone, heat alone, alternating between ice & heat. Experiment to what works for you. Swelling and bruising can take several weeks to resolve.  3. It is helpful to take an over-the-counter pain medication regularly for the first few weeks. Choose one of the following that works best for you:  i. Naproxen (Aleve, etc) Two 220mg tabs twice a day ii. Ibuprofen (Advil, etc) Three 200mg tabs four times a day (every meal & bedtime) iii. Acetaminophen (Tylenol, etc) 500-650mg four times a day (every meal & bedtime) 4. A prescription for pain medication (such as oxycodone, hydrocodone, etc) may be given to you upon discharge. Take your pain medication as prescribed.  i. If you are having problems/concerns with the prescription medicine (does not control pain, nausea, vomiting, rash, itching, etc), please call us [phone] to see if we need to switch you to a different pain medicine that will work better for you and/or control your side effect better. ii. If you need a refill on your pain medication, please contact your pharmacy. They will contact our office to request authorization. Prescriptions will not be filled after 5 pm or on week-ends. 1. Avoid getting constipated. When taking pain medications, it is common to experience some constipation. Increasing fluid intake and taking a fiber supplement (such as Metamucil, Citrucel, FiberCon,  MiraLax, etc) 1-2 times a day regularly will usually help prevent this problem from occurring. A mild laxative (prune juice, Milk of Magnesia, MiraLax, etc) should be taken according to package directions if there are no bowel movements after 48 hours.  2. Watch out for diarrhea. If you have many loose bowel movements, simplify your diet to bland foods & liquids for a few days. Stop any stool softeners and decrease your fiber supplement. Switching to mild anti-diarrheal medications (Kayopectate, Pepto Bismol) can help. If this worsens or does not improve, please call us. 3. Chest tube site wound: you may remove the dressing from your chest tube site 3 days after the removal of your chest tube. DO NOT shower over the dressing. Once   removed, you may shower as normal. Do not submerge your wound in water for 2-3 weeks.  4. FOLLOW UP  a. Please call our office to set up or confirm an appointment for follow up for 2 weeks after discharge. You will need to get a chest xray at either [facility] or [facility]. This will be outlined in your follow up instructions. Please call [facility] at [phone] if you have any questions about follow up.  b. If you have any orthopedic or other injuries you will need to follow up as outlined in your follow up instructions.   WHEN TO CALL US [phone]:  1. Poor pain control 2. Reactions / problems with new medications (rash/itching, nausea, etc)  3. Fever over 101.5 F (38.5 C) 4. Worsening swelling or bruising 5. Redness, drainage, pain or swelling around chest   tube site 6. Worsening pain, productive cough, difficulty breathing or any other concerning symptoms  The clinic staff is available to answer your questions during regular business hours (8:30am-5pm). Please don't hesitate to call and ask to speak to one of our nurses for clinical concerns.  If you have a medical emergency, go to the nearest emergency room or call 911.  A surgeon from [facility] is always on call at the hospitals   [facility]  [address], [address], [place], [place] [address] ?  MAIN: [phone] ? TOLL FREE: [phone] ?  FAX [phone]  [email or web address]      Information on Rib Fractures  A rib fracture is a break or crack in one of the bones of the ribs. The ribs are long, curved bones that wrap around your chest and attach to your spine and your breastbone. The ribs protect your heart, lungs, and other organs in the chest. A broken or cracked rib is often painful but is not usually serious. Most rib fractures heal on their own over time. However, rib fractures can be more serious if multiple ribs are broken or if broken ribs move out of place and push against other structures or organs. What are the causes? This condition is caused by:  Repetitive movements with high force, such as pitching a baseball or having severe coughing spells.  A direct blow to the chest, such as a sports injury, a car accident, or a fall.  Cancer that has spread to the bones, which can weaken bones and cause them to break. What are the signs or symptoms? Symptoms of this condition include:  Pain when you breathe in or cough.  Pain when someone presses on the injured area.  Feeling short of breath. How is this diagnosed? This condition is diagnosed with a physical exam and medical history. Imaging tests may also be done, such as:  Chest X-ray.  CT scan.  MRI.  Bone scan.  Chest ultrasound. How is this treated? Treatment for this condition depends on the severity of the fracture. Most rib fractures usually heal on their own in 1-3 months. Sometimes healing takes longer if there is a cough that does not stop or if there are other activities that make the injury worse (aggravating factors). While you heal, you will be given medicines to control the pain. You will also be taught deep breathing  exercises. Severe injuries may require hospitalization or surgery. Follow these instructions at home: Managing pain, stiffness, and swelling  If directed, apply ice to the injured area. ? Put ice in a plastic bag. ? Place a towel between your skin and the bag. ? Leave the ice on for 20 minutes, 2-3 times a day.  Take over-the-counter and prescription medicines only as told by your health care provider. Activity  Avoid a lot of activity and any activities or movements that cause pain. Be careful during activities and avoid bumping the injured rib.  Slowly increase your activity as told by your health care provider. General instructions  Do deep breathing exercises as told by your health care provider. This helps prevent pneumonia, which is a common complication of a broken rib. Your health care provider may instruct you to: ? Take deep breaths several times a day. ? Try to cough several times a day, holding a pillow against the injured area. ? Use a device called incentive spirometer to practice deep breathing several times a day.  Drink enough   fluid to keep your urine pale yellow.  Do not wear a rib belt or binder. These restrict breathing, which can lead to pneumonia.  Keep all follow-up visits as told by your health care provider. This is important. Contact a health care provider if:  You have a fever. Get help right away if:  You have difficulty breathing or you are short of breath.  You develop a cough that does not stop, or you cough up thick or bloody sputum.  You have nausea, vomiting, or pain in your abdomen.  Your pain gets worse and medicine does not help. Summary  A rib fracture is a break or crack in one of the bones of the ribs.  A broken or cracked rib is often painful but is not usually serious.  Most rib fractures heal on their own over time.  Treatment for this condition depends on the severity of the fracture.  Avoid a lot of activity and any  activities or movements that cause pain. This information is not intended to replace advice given to you by your health care provider. Make sure you discuss any questions you have with your health care provider. Document Released: 04/26/2005 Document Revised: 07/26/2016 Document Reviewed: 07/26/2016 Elsevier Interactive Patient Education  2019 Elsevier Inc.    Pneumothorax A pneumothorax is commonly called a collapsed lung. It is a condition in which air leaks from a lung and builds up between the thin layer of tissue that covers the lungs (visceral pleura) and the interior wall of the chest cavity (parietal pleura). The air gets trapped outside the lung, between the lung and the chest wall (pleural space). The air takes up space and prevents the lung from fully expanding. This condition sometimes occurs suddenly with no apparent cause. The buildup of air may be small or large. A small pneumothorax may go away on its own. A large pneumothorax will require treatment and hospitalization. What are the causes? This condition may be caused by:  Trauma and injury to the chest wall.  Surgery and other medical procedures.  A complication of an underlying lung problem, especially chronic obstructive pulmonary disease (COPD) or emphysema. Sometimes the cause of this condition is not known. What increases the risk? You are more likely to develop this condition if:  You have an underlying lung problem.  You smoke.  You are 20-40 years old, female, tall, and underweight.  You have a personal or family history of pneumothorax.  You have an eating disorder (anorexia nervosa). This condition can also happen quickly, even in people with no history of lung problems. What are the signs or symptoms? Sometimes a pneumothorax will have no symptoms. When symptoms are present, they can include:  Chest pain.  Shortness of breath.  Increased rate of breathing.  Bluish color to your lips or skin  (cyanosis). How is this diagnosed? This condition may be diagnosed by:  A medical history and physical exam.  A chest X-ray, chest CT scan, or ultrasound. How is this treated? Treatment depends on how severe your condition is. The goal of treatment is to remove the extra air and allow your lung to expand back to its normal size.  For a small pneumothorax: ? No treatment may be needed. ? Extra oxygen is sometimes used to make it go away more quickly.  For a large pneumothorax or a pneumothorax that is causing symptoms, a procedure is done to drain the air from your lungs. To do this, a health care provider may   use: ? A needle with a syringe. This is used to suck air from a pleural space where no additional leakage is taking place. ? A chest tube. This is used to suck air where there is ongoing leakage into the pleural space. The chest tube may need to remain in place for several days until the air leak has healed.  In more severe cases, surgery may be needed to repair the damage that is causing the leak.  If you have multiple pneumothorax episodes or have an air leak that will not heal, a procedure called a pleurodesis may be done. A medicine is placed in the pleural space to irritate the tissues around the lung so that the lung will stick to the chest wall, seal any leaks, and stop any buildup of air in that space. If you have an underlying lung problem, severe symptoms, or a large pneumothorax you will usually need to stay in the hospital. Follow these instructions at home: Lifestyle  Do not use any products that contain nicotine or tobacco, such as cigarettes and e-cigarettes. These are major risk factors in pneumothorax. If you need help quitting, ask your health care provider.  Do not lift anything that is heavier than 10 lb (4.5 kg), or the limit that your health care provider tells you, until he or she says that it is safe.  Avoid activities that take a lot of effort (strenuous)  for as long as told by your health care provider.  Return to your normal activities as told by your health care provider. Ask your health care provider what activities are safe for you.  Do not fly in an airplane or scuba dive until your health care provider says it is okay. General instructions  Take over-the-counter and prescription medicines only as told by your health care provider.  If a cough or pain makes it difficult for you to sleep at night, try sleeping in a semi-upright position in a recliner or by using 2 or 3 pillows.  If you had a chest tube and it was removed, ask your health care provider when you can remove the bandage (dressing). While the dressing is in place, do not allow it to get wet.  Keep all follow-up visits as told by your health care provider. This is important. Contact a health care provider if:  You cough up thick mucus (sputum) that is yellow or green in color.  You were treated with a chest tube, and you have redness, increasing pain, or discharge at the site where it was placed. Get help right away if:  You have increasing chest pain or shortness of breath.  You have a cough that will not go away.  You begin coughing up blood.  You have pain that is getting worse or is not controlled with medicines.  The site where your chest tube was located opens up.  You feel air coming out of the site where the chest tube was placed.  You have a fever or persistent symptoms for more than 2-3 days.  You have a fever and your symptoms suddenly get worse. These symptoms may represent a serious problem that is an emergency. Do not wait to see if the symptoms will go away. Get medical help right away. Call your local emergency services (911 in the [place] Do not drive yourself to the hospital. Summary  A pneumothorax, commonly called a collapsed lung, is a condition in which air leaks from a lung and gets trapped between the   lung and the chest wall (pleural  space).  The buildup of air may be small or large. A small pneumothorax may go away on its own. A large pneumothorax will require treatment and hospitalization.  Treatment for this condition depends on how severe the pneumothorax is. The goal of treatment is to remove the extra air and allow the lung to expand back to its normal size. This information is not intended to replace advice given to you by your health care provider. Make sure you discuss any questions you have with your health care provider. Document Released: 04/26/2005 Document Revised: 04/04/2017 Document Reviewed: 04/04/2017 Elsevier Interactive Patient Education  2019 Elsevier Inc.   

## 2018-10-27 NOTE — Progress Notes (Signed)
[facility]/Trauma Progress Note      Assessment/Plan Fall Left 8th and 9th rib fx- Pulm toliet, IS, pain control Left PTX 25-30% -S/P LCT. O2. Pulm toilet, IS, pain control, CXRtodayshowed no PTX, DC CT, CXR 1300 Right shin abrasion - Tetanus vaccine, local wound care Anemia - Hgb 10.3 today. Stable. No hypotension or tachycardia.  C-spine cleared  ID -None VTE -SCDs, Lovenox FEN -Regular POC - Willeen Cass (Daughter) [phone] [facility]) or [phone] (Mobile) & [name] Allen(son)[phone] (mobile)   Plan: DC CT, CXR 1300, PT/OT, pain control.wean O2. Possible DC this afternoon pending O2 sats.   Talked to [name], daughter, on phone.    LOS: 3 days    Subjective: CC: Rib pain  Wants to go home. She was tearful cause she does not think she is getting great care. She was upset that she had 2 men taking care of her last night. She does not use O2 at home. No increased CP, no SOB or fever overnight. She states she is pulling 750cc on IS.   Objective: Vital signs in last 24 hours: Temp:  [98.4 F (36.9 C)-98.7 F (37.1 C)] 98.5 F (36.9 C) (06/19 0752) Pulse Rate:  [46-91] 46 (06/19 0404) Resp:  [11-24] 11 (06/19 0404) BP: (120-147)/(74-88) 136/88 (06/19 0404) SpO2:  [92 %-96 %] 95 % (06/19 0404) Last BM Date: 10/23/18  Intake/Output from previous day: 06/18 0701 - 06/19 0700 In: 2359.3 [P.O.:820; I.V.:1539.3] Out: 34 [Chest Tube:34] Intake/Output this shift: No intake/output data recorded.  PE: Gen: Alert, NAD, tearful Card: regular rate, irregular rhythm, no M/G/R heard Pulm: mildly diminished breath sounds L base, no W/R/R, rate andeffort normal, CT without air leak Skin: no rashes noted, warm and dry Extremities: moves all 4's, no LE edema Neuro: no gross motor or sensory deficits. A&O   Anti-infectives: Anti-infectives (From admission, onward)   None      Lab Results:  Recent Labs    10/26/18 0452 10/27/18 0448   WBC 7.9 7.5  HGB 9.9* 10.3*  HCT 32.6* 33.0*  PLT 260 278   BMET Recent Labs    10/24/18 1522 10/25/18 0332  NA 139 137  K 4.0 4.5  CL 106 108  CO2 23 23  GLUCOSE 106* 99  BUN 23 16  CREATININE 0.91 0.73  CALCIUM 9.2 8.9   PT/INR No results for input(s): LABPROT, INR in the last 72 hours. CMP     Component Value Date/Time   NA 137 10/25/2018 0332   K 4.5 10/25/2018 0332   CL 108 10/25/2018 0332   CO2 23 10/25/2018 0332   GLUCOSE 99 10/25/2018 0332   BUN 16 10/25/2018 0332   CREATININE 0.73 10/25/2018 0332   CALCIUM 8.9 10/25/2018 0332   PROT 5.4 (L) 05/27/2015 0615   ALBUMIN 2.5 (L) 05/27/2015 0615   AST 36 05/27/2015 0615   ALT 22 05/27/2015 0615   ALKPHOS 65 05/27/2015 0615   BILITOT 0.4 05/27/2015 0615   GFRNONAA >60 10/25/2018 0332   GFRAA >60 10/25/2018 0332   Lipase     Component Value Date/Time   LIPASE 35 05/30/2012 1450    Studies/Results: Dg Chest Port 1 View  Result Date: 10/27/2018 CLINICAL DATA:  Pneumothorax. EXAM: PORTABLE CHEST 1 VIEW COMPARISON:  10/26/2018. FINDINGS: Left chest tube in stable position. No pneumothorax. Stable cardiomegaly. Persistent left base atelectasis and small bilateral pleural effusions. No interim change. Prior thoracic and lumbar vertebroplasties. IMPRESSION: 1.  Left chest tube stable position.  No pneumothorax. 2.  Persistent left base atelectasis and small bilateral pleural effusions. No interim change. Electronically Signed   By: Marcello Moores  Register   On: 10/27/2018 07:57   Dg Chest Port 1 View  Result Date: 10/26/2018 CLINICAL DATA:  Chest tube in place EXAM: PORTABLE CHEST 1 VIEW COMPARISON:  10/25/2018 FINDINGS: Stable AP portable examination with a left-sided pigtail chest tube in position. No significant pneumothorax is appreciated. There are persistent small bilateral pleural effusions and associated atelectasis. No new airspace opacity. Cardiomegaly. IMPRESSION: Stable AP portable examination with a left-sided  pigtail chest tube in position. No significant pneumothorax is appreciated. There are persistent small bilateral pleural effusions and associated atelectasis. No new airspace opacity. Cardiomegaly. Electronically Signed   By: Eddie Candle M.D.   On: 10/26/2018 09:44      Kalman Drape , [facility] 10/27/2018, 9:52 AM  Pager: [phone] Mon-Wed, Friday 7:00am-4:30pm Thurs 7am-11:30am  Consults: [phone]

## 2018-10-27 NOTE — Care Management Important Message (Signed)
Important Message  Patient Details  Name: Katrina Burnett MRN: [id number] Date of Birth: [birth year]   Medicare Important Message Given:        [name] 10/27/2018, 4:18 PM

## 2018-10-28 ENCOUNTER — Inpatient Hospital Stay (HOSPITAL_COMMUNITY): Payer: Medicare Other

## 2018-10-28 MED ORDER — TRAZODONE HCL 50 MG PO TABS: 25.0000 mg | ORAL_TABLET | Freq: Every evening | ORAL | Status: DC | PRN

## 2018-10-28 MED ORDER — ESOMEPRAZOLE MAGNESIUM 40 MG PO CPDR: 40.0000 mg | DELAYED_RELEASE_CAPSULE | Freq: Every day | ORAL | Status: DC

## 2018-10-28 NOTE — Progress Notes (Signed)
Pt given d/c education and all questions answered. No printed prescriptions to give and equipment was delivered. IV removed and patient taken to car with all belongings.

## 2018-10-28 NOTE — Progress Notes (Addendum)
   Subjective/Chief Complaint: Wants to go home C/o some indigestion No sob/wob No n/v C/o that didn't meals at approp times yesterday   Objective: Vital signs in last 24 hours: Temp:  [97.7 F (36.5 C)-98.5 F (36.9 C)] 98.5 F (36.9 C) (06/20 0915) Pulse Rate:  [46-157] 157 (06/20 0915) Resp:  [14-23] 20 (06/20 0915) BP: (130-162)/(80-100) 162/86 (06/20 0915) SpO2:  [80 %-100 %] 80 % (06/20 0915) Last BM Date: 10/27/18  Intake/Output from previous day: 06/19 0701 - 06/20 0700 In: 25 [P.O.:960] Out: 852 [Urine:850; Chest Tube:2] Intake/Output this shift: Total I/O In: 240 [P.O.:240] Out: -   Gen: Alert, NAD,  Card:regular rate, irregular rhythm, no M/G/R heard Pulm:mildly diminished breath sounds L base, no W/R/R, rate andeffort normal,  Skin: no rashes noted, warm and dry Extremities: moves all 4's, no LE edema Neuro: no gross motor or sensory deficits. A&O   Lab Results:  Recent Labs    10/26/18 0452 10/27/18 0448  WBC 7.9 7.5  HGB 9.9* 10.3*  HCT 32.6* 33.0*  PLT 260 278   BMET No results for input(s): NA, K, CL, CO2, GLUCOSE, BUN, CREATININE, CALCIUM in the last 72 hours. PT/INR No results for input(s): LABPROT, INR in the last 72 hours. ABG No results for input(s): PHART, HCO3 in the last 72 hours.  Invalid input(s): PCO2, PO2  Studies/Results: Dg Chest Port 1 View  Result Date: 10/27/2018 CLINICAL DATA:  85 year old female with a history of pneumothorax EXAM: PORTABLE CHEST 1 VIEW COMPARISON:  10/27/2018 FINDINGS: Cardiomediastinal silhouette unchanged in size and contour. Interval removal of left-sided thoracostomy tube. No visualized pneumothorax. Blunting at the bilateral costophrenic angles with partial obscuration of the right hemidiaphragm. No pneumothorax on the right. Coarsened interstitial markings bilateral lungs. IMPRESSION: Interval removal of left thoracostomy tube with no visualized pneumothorax. Small bilateral pleural  effusions. Electronically Signed   By: Corrie Mckusick D.O.   On: 10/27/2018 13:40   Dg Chest Port 1 View  Result Date: 10/27/2018 CLINICAL DATA:  Pneumothorax. EXAM: PORTABLE CHEST 1 VIEW COMPARISON:  10/26/2018. FINDINGS: Left chest tube in stable position. No pneumothorax. Stable cardiomegaly. Persistent left base atelectasis and small bilateral pleural effusions. No interim change. Prior thoracic and lumbar vertebroplasties. IMPRESSION: 1.  Left chest tube stable position.  No pneumothorax. 2. Persistent left base atelectasis and small bilateral pleural effusions. No interim change. Electronically Signed   By: Marcello Moores  Register   On: 10/27/2018 07:57    Anti-infectives: Anti-infectives (From admission, onward)   None      Assessment/Plan: Fall Left 8th and 9th rib fx- Pulm toliet, IS, pain control Left PTX 25-30% -S/P LCT. O2. Pulm toilet, IS, pain control, CXRtodayshowed no PTX, DC CT, CXR 1300; CXR this am looks ok to me, no PTX that I can see; she maintained O2 sats of 92-95% while I was in room on RA; IS [phone];  Right shin abrasion - Tetanus vaccine, local wound care Anemia - Hgb10.36/19. Stable.No hypotension or tachycardia.  C-spine cleared  ID -None VTE -SCDs, Lovenox FEN -Regular  dispo - ok to dc home with HHPT/OT; cont pulm toilet/IS at home. Discussed dc instructions with pt. Discussed with daughter via phone.   Katrina Burnett. Redmond Pulling, MD, FACS General, Bariatric, & Minimally Invasive Surgery [facility], [place]   LOS: 4 days    [name] 10/28/2018

## 2018-10-28 NOTE — Progress Notes (Signed)
Physical Therapy Treatment Patient Details Name: Katrina Burnett MRN: [id number] DOB: [birth year] Today's Date: 10/28/2018    History of Present Illness 85 y.o. female with a history of chronic low back pain on chronic pain medication, arthritis, GERD who presented the ED today after a fall. Pt sustained L 8-9 rib fx, L PTX.     PT Comments    Pt denying pain, eager to go home. Making good progress towards physical therapy goals, ambulating in room with walker at supervision level. SpO2 95% on RA, DOE 0/4, HR up to 118 bpm. Performing ADL's I.e. dressing, bathing, brushing teeth independently. D/c plan remains appropriate.     Follow Up Recommendations  Home health PT;Supervision/Assistance - 24 hour     Equipment Recommendations  3in1 (PT)    Recommendations for Other Services       Precautions / Restrictions Precautions Precautions: Fall Restrictions Weight Bearing Restrictions: No    Mobility  Bed Mobility Overal bed mobility: Modified Independent                Transfers Overall transfer level: Needs assistance Equipment used: Rolling walker (2 wheeled);None Transfers: Sit to/from Stand Sit to Stand: Supervision            Ambulation/Gait Ambulation/Gait assistance: Supervision Gait Distance (Feet): 30 Feet Assistive device: Rolling walker (2 wheeled);None Gait Pattern/deviations: Step-through pattern;Decreased stride length;Trunk flexed Gait velocity: decreased   General Gait Details: pt lightly utilizing walker for balance, trunk flexed posture. slightly more unsteady without walker but no physical assist required   Stairs             Wheelchair Mobility    Modified Rankin (Stroke Patients Only)       Balance Overall balance assessment: Needs assistance Sitting-balance support: Feet supported Sitting balance-Leahy Scale: Good     Standing balance support: No upper extremity supported;During functional activity Standing balance-Leahy  Scale: Fair Standing balance comment: Participating in hygiene task at sink without support                            Cognition Arousal/Alertness: Awake/alert Behavior During Therapy: WFL for tasks assessed/performed Overall Cognitive Status: Within Functional Limits for tasks assessed                                        Exercises      General Comments        Pertinent Vitals/Pain Pain Assessment: No/denies pain    Home Living                      Prior Function            PT Goals (current goals can now be found in the care plan section) Acute Rehab PT Goals Patient Stated Goal: "I want to go home" PT Goal Formulation: With patient Time For Goal Achievement: 11/08/18 Potential to Achieve Goals: Good Progress towards PT goals: Progressing toward goals    Frequency    Min 4X/week      PT Plan Current plan remains appropriate    Co-evaluation              AM-PAC PT "6 Clicks" Mobility   Outcome Measure  Help needed turning from your back to your side while in a flat bed without using bedrails?: None Help needed moving from lying  on your back to sitting on the side of a flat bed without using bedrails?: None Help needed moving to and from a bed to a chair (including a wheelchair)?: None Help needed standing up from a chair using your arms (e.g., wheelchair or bedside chair)?: None Help needed to walk in hospital room?: None Help needed climbing 3-5 steps with a railing? : A Little 6 Click Score: 23    End of Session Equipment Utilized During Treatment: Gait belt Activity Tolerance: Patient tolerated treatment well Patient left: with call bell/phone within reach;in bed Nurse Communication: Mobility status PT Visit Diagnosis: Unsteadiness on feet (R26.81);Other abnormalities of gait and mobility (R26.89);Muscle weakness (generalized) (M62.81);History of falling (Z91.81)     Time: 3614-4315 PT Time Calculation  (min) (ACUTE ONLY): 22 min  Charges:  $Therapeutic Activity: 8-22 mins                     Ellamae Sia, PT, DPT Acute Rehabilitation Services Pager [phone] Office [phone]    Willy Eddy 10/28/2018, 12:46 PM

## 2018-10-30 DIAGNOSIS — Z8701 Personal history of pneumonia (recurrent): Secondary | ICD-10-CM | POA: Diagnosis not present

## 2018-10-30 DIAGNOSIS — I8390 Asymptomatic varicose veins of unspecified lower extremity: Secondary | ICD-10-CM | POA: Diagnosis not present

## 2018-10-30 DIAGNOSIS — S2242XD Multiple fractures of ribs, left side, subsequent encounter for fracture with routine healing: Secondary | ICD-10-CM | POA: Diagnosis not present

## 2018-10-30 DIAGNOSIS — M8088XD Other osteoporosis with current pathological fracture, vertebra(e), subsequent encounter for fracture with routine healing: Secondary | ICD-10-CM | POA: Diagnosis not present

## 2018-10-30 DIAGNOSIS — M19041 Primary osteoarthritis, right hand: Secondary | ICD-10-CM | POA: Diagnosis not present

## 2018-10-30 DIAGNOSIS — K2270 Barrett's esophagus without dysplasia: Secondary | ICD-10-CM | POA: Diagnosis not present

## 2018-10-30 DIAGNOSIS — S40012D Contusion of left shoulder, subsequent encounter: Secondary | ICD-10-CM | POA: Diagnosis not present

## 2018-10-30 DIAGNOSIS — F329 Major depressive disorder, single episode, unspecified: Secondary | ICD-10-CM | POA: Diagnosis not present

## 2018-10-30 DIAGNOSIS — I700 Atherosclerosis of aorta: Secondary | ICD-10-CM | POA: Diagnosis not present

## 2018-10-30 DIAGNOSIS — Z8781 Personal history of (healed) traumatic fracture: Secondary | ICD-10-CM | POA: Diagnosis not present

## 2018-10-30 DIAGNOSIS — H409 Unspecified glaucoma: Secondary | ICD-10-CM | POA: Diagnosis not present

## 2018-10-30 DIAGNOSIS — G8929 Other chronic pain: Secondary | ICD-10-CM | POA: Diagnosis not present

## 2018-10-30 DIAGNOSIS — D62 Acute posthemorrhagic anemia: Secondary | ICD-10-CM | POA: Diagnosis not present

## 2018-10-30 DIAGNOSIS — S80811D Abrasion, right lower leg, subsequent encounter: Secondary | ICD-10-CM | POA: Diagnosis not present

## 2018-10-30 DIAGNOSIS — M545 Low back pain: Secondary | ICD-10-CM | POA: Diagnosis not present

## 2018-10-30 DIAGNOSIS — M19042 Primary osteoarthritis, left hand: Secondary | ICD-10-CM | POA: Diagnosis not present

## 2018-10-30 DIAGNOSIS — K5790 Diverticulosis of intestine, part unspecified, without perforation or abscess without bleeding: Secondary | ICD-10-CM | POA: Diagnosis not present

## 2018-10-30 DIAGNOSIS — Z79891 Long term (current) use of opiate analgesic: Secondary | ICD-10-CM | POA: Diagnosis not present

## 2018-10-30 DIAGNOSIS — F4323 Adjustment disorder with mixed anxiety and depressed mood: Secondary | ICD-10-CM | POA: Diagnosis not present

## 2018-10-30 DIAGNOSIS — K219 Gastro-esophageal reflux disease without esophagitis: Secondary | ICD-10-CM | POA: Diagnosis not present

## 2018-10-30 DIAGNOSIS — Z87891 Personal history of nicotine dependence: Secondary | ICD-10-CM | POA: Diagnosis not present

## 2018-10-30 DIAGNOSIS — M9420 Chondromalacia, unspecified site: Secondary | ICD-10-CM | POA: Diagnosis not present

## 2018-10-30 DIAGNOSIS — F419 Anxiety disorder, unspecified: Secondary | ICD-10-CM | POA: Diagnosis not present

## 2018-10-30 DIAGNOSIS — W19XXXD Unspecified fall, subsequent encounter: Secondary | ICD-10-CM | POA: Diagnosis not present

## 2018-10-30 DIAGNOSIS — Z9181 History of falling: Secondary | ICD-10-CM | POA: Diagnosis not present

## 2018-10-31 ENCOUNTER — Ambulatory Visit: Payer: PRIVATE HEALTH INSURANCE

## 2018-10-31 DIAGNOSIS — S2242XD Multiple fractures of ribs, left side, subsequent encounter for fracture with routine healing: Secondary | ICD-10-CM | POA: Diagnosis not present

## 2018-10-31 DIAGNOSIS — S80811D Abrasion, right lower leg, subsequent encounter: Secondary | ICD-10-CM | POA: Diagnosis not present

## 2018-10-31 DIAGNOSIS — S40012D Contusion of left shoulder, subsequent encounter: Secondary | ICD-10-CM | POA: Diagnosis not present

## 2018-10-31 DIAGNOSIS — M8088XD Other osteoporosis with current pathological fracture, vertebra(e), subsequent encounter for fracture with routine healing: Secondary | ICD-10-CM | POA: Diagnosis not present

## 2018-10-31 DIAGNOSIS — D62 Acute posthemorrhagic anemia: Secondary | ICD-10-CM | POA: Diagnosis not present

## 2018-10-31 DIAGNOSIS — I8390 Asymptomatic varicose veins of unspecified lower extremity: Secondary | ICD-10-CM | POA: Diagnosis not present

## 2018-10-31 NOTE — Discharge Summary (Signed)
[facility]/Trauma Discharge Summary   Patient ID: Katrina Burnett MRN: [id number] DOB/AGE: [birth year] 83 y.o.  Admit date: 10/24/2018 Discharge date: 10/31/2018  Admitting Diagnosis: Fall Left 8th and 9th rib fx  Left PTX 25-30% Right shin abrasion  Anemia  Discharge Diagnosis Patient Active Problem List   Diagnosis Date Noted  . Pneumothorax on left 10/24/2018  . Pneumothorax, left 10/24/2018  . CAP (community acquired pneumonia) 11/25/2016  . Anemia 11/25/2016  . Hyponatremia 11/25/2016  . Fracture, intertrochanteric, right femur ([place]) 05/26/2015  . Closed fracture of femur ([place])   . Post-operative pain   . Acute blood loss anemia   . Adjustment disorder with mixed anxiety and depressed mood   . Fall   . Abnormality of gait   . Femur fracture, right, closed, initial encounter 05/23/2015  . Fracture of femoral neck, right, closed ([place]) 05/22/2015  . Femoral neck fracture, right, closed, initial encounter 05/22/2015  . Syncope 05/30/2012  . Nausea & vomiting 05/30/2012  . Chest pain 05/30/2012  . Hypotension 05/30/2012  . Esophageal reflux 12/16/2011  . Lumbar compression fracture ([place])   . Polymyalgia ([place])     Consultants none  Imaging: No results found.  Procedures Dr. Achille Rich (10/24/18) - L chest tube placement  HPI: Katrina Burnett is a 85 y.o. female with a history of chronic low back pain on chronic pain medication, arthritis, GERD who presented the ED today after a fall.  The patient reports that earlier this morning she was taking her dog back inside and slipped on a wet porch step and landed on her left side.  She denies any head trauma or loss of consciousness.  She is not any blood thinners.  She denies being down for prolonged period of time.  She reportedly was able to walk back inside and take an oxycodone before calling the ambulance.  She complains of left-sided chest pain, shortness of breath and left shoulder pain.  She denies any headache,  changes in vision, neck pain, back pain, abdominal pain, lower extremity pain, nausea or vomiting.  She lives at home with her son [name]. He was reportedly out when this happened and did not witness this. She also had a fall 1.5 weeks ago per family for which she saw her PCP for.  She normally ambulates with a walker at home and is fairly independent.   In the ED patient underwent a CT of the cervical spine and chest.  CT cervical spine without evidence of cervical spine fracture.  CT chest with a left-sided pneumothorax estimated at 25-30% with a small left effusion and acute fractures of the left eighth and ninth posterior ribs.  Patient currently on nonrebreather.  Her oxygen saturations dropped to 81-85% with talking.  EDP to place chest tube.  Hospital Course:  Workup showed Left 8th and 9th rib fx, Left PTX 25-30%, and Right shin abrasion. Chest tube was placed by EDP. Patient was admitted to the trauma service and transferred from [place] [facility] to [facility]. Repeat CXR on 06/17 showed resolution of PTX. CT was removed on 06/19. Pt worked with therapies who recommended HH.    On 10/28/18, the patient was voiding well, tolerating diet, ambulating well, pain well controlled, vital signs stable, CT site c/d/i and felt stable for discharge home.  Patient will follow up as outlined below and knows to call with questions or concerns.     Physical Exam: See progress note from Dr. [name] on 06/20.   Allergies  as of 10/28/2018      Reactions   Avelox [moxifloxacin Hcl In Nacl] Other (See Comments)   Felt bad   Biaxin [clarithromycin] Nausea Only   Cefuroxime Diarrhea   Levaquin [levofloxacin In D5w] Diarrhea   Minocycline Nausea And Vomiting   Paxil [paroxetine Hcl] Nausea And Vomiting   Prednisone Nausea Only   Remeron [mirtazapine]    Doesn't remember    Sulfur Nausea And Vomiting   Trimox [amoxicillin] Other (See Comments)   Doesn't remember  Has patient had a PCN reaction causing  immediate rash, facial/tongue/throat swelling, SOB or lightheadedness with hypotension: Unknown Has patient had a PCN reaction causing severe rash involving mucus membranes or skin necrosis: Unknown Has patient had a PCN reaction that required hospitalization: Unknown Has patient had a PCN reaction occurring within the last 10 years: Unknown If all of the above answers are "NO", then may proceed with Cephalosporin use.   Viibryd [vilazodone Hcl] Other (See Comments)   Doesn't remember       Medication List    STOP taking these medications   glycopyrrolate 1 MG tablet Commonly known as: Robinul   HYDROcodone-acetaminophen 5-325 MG tablet Commonly known as: Norco   traMADol 50 MG tablet Commonly known as: ULTRAM     TAKE these medications   Align 4 MG Caps Take 4 mg by mouth at bedtime.   cetirizine 10 MG tablet Commonly known as: ZYRTEC Take 10 mg by mouth daily.   cholecalciferol 1000 units tablet Commonly known as: VITAMIN D Take 1,000 Units by mouth at bedtime.   citalopram 40 MG tablet Commonly known as: CELEXA Take 40 mg by mouth daily.   dorzolamide 2 % ophthalmic solution Commonly known as: TRUSOPT Place 1 drop into both eyes 2 (two) times daily.   esomeprazole 40 MG capsule Commonly known as: NEXIUM Take 1 capsule (40 mg total) by mouth daily.   fluticasone 50 MCG/ACT nasal spray Commonly known as: FLONASE Place 1 spray into both nostrils daily as needed for allergies.   ibuprofen 200 MG tablet Commonly known as: ADVIL Take 400 mg by mouth every 6 (six) hours as needed for mild pain.   ketoconazole 2 % shampoo Commonly known as: NIZORAL Apply 1 application topically daily as needed for irritation.   latanoprost 0.005 % ophthalmic solution Commonly known as: XALATAN Place 1 drop into both eyes at bedtime.   LORazepam 1 MG tablet Commonly known as: ATIVAN Take 1 mg by mouth 2 (two) times daily as needed for anxiety.   multivitamin tablet Take 1  tablet by mouth at bedtime.   oxyCODONE-acetaminophen 5-325 MG tablet Commonly known as: Percocet Take 1-2 tablets by mouth every 6 (six) hours as needed.   traZODone 50 MG tablet Commonly known as: DESYREL Take 0.5-1 tablets (25-50 mg total) by mouth at bedtime as needed for sleep.        Follow-up Information    [facility]. Go on 11/14/2018.   Why: at Please arrive 20 minutes prior to complete paperwork. please bring photo ID and insurance card Contact information: [place] [id number] [place]. Go on 11/10/2018.   Why: for a chest xray. This is needed before your follow up appointment with trauma. No appointment needed. If you have any issues with this please call [facility] at [place] information: [place] [phone]  Signed: [facility] 10/31/2018, 2:00 PM Pager: [phone] Consults: [phone] Mon-Fri 7:00 am-4:30 pm Sat-Sun 7:00 am-11:30 am

## 2018-11-03 DIAGNOSIS — S2242XD Multiple fractures of ribs, left side, subsequent encounter for fracture with routine healing: Secondary | ICD-10-CM | POA: Diagnosis not present

## 2018-11-03 DIAGNOSIS — S40012D Contusion of left shoulder, subsequent encounter: Secondary | ICD-10-CM | POA: Diagnosis not present

## 2018-11-03 DIAGNOSIS — S80811D Abrasion, right lower leg, subsequent encounter: Secondary | ICD-10-CM | POA: Diagnosis not present

## 2018-11-03 DIAGNOSIS — M8088XD Other osteoporosis with current pathological fracture, vertebra(e), subsequent encounter for fracture with routine healing: Secondary | ICD-10-CM | POA: Diagnosis not present

## 2018-11-03 DIAGNOSIS — I8390 Asymptomatic varicose veins of unspecified lower extremity: Secondary | ICD-10-CM | POA: Diagnosis not present

## 2018-11-03 DIAGNOSIS — D62 Acute posthemorrhagic anemia: Secondary | ICD-10-CM | POA: Diagnosis not present

## 2018-11-07 DIAGNOSIS — S40012D Contusion of left shoulder, subsequent encounter: Secondary | ICD-10-CM | POA: Diagnosis not present

## 2018-11-07 DIAGNOSIS — S2242XD Multiple fractures of ribs, left side, subsequent encounter for fracture with routine healing: Secondary | ICD-10-CM | POA: Diagnosis not present

## 2018-11-07 DIAGNOSIS — D62 Acute posthemorrhagic anemia: Secondary | ICD-10-CM | POA: Diagnosis not present

## 2018-11-07 DIAGNOSIS — M8088XD Other osteoporosis with current pathological fracture, vertebra(e), subsequent encounter for fracture with routine healing: Secondary | ICD-10-CM | POA: Diagnosis not present

## 2018-11-07 DIAGNOSIS — S80811D Abrasion, right lower leg, subsequent encounter: Secondary | ICD-10-CM | POA: Diagnosis not present

## 2018-11-07 DIAGNOSIS — I8390 Asymptomatic varicose veins of unspecified lower extremity: Secondary | ICD-10-CM | POA: Diagnosis not present

## 2018-11-09 DIAGNOSIS — D62 Acute posthemorrhagic anemia: Secondary | ICD-10-CM | POA: Diagnosis not present

## 2018-11-09 DIAGNOSIS — M8088XD Other osteoporosis with current pathological fracture, vertebra(e), subsequent encounter for fracture with routine healing: Secondary | ICD-10-CM | POA: Diagnosis not present

## 2018-11-09 DIAGNOSIS — I8390 Asymptomatic varicose veins of unspecified lower extremity: Secondary | ICD-10-CM | POA: Diagnosis not present

## 2018-11-09 DIAGNOSIS — S2242XD Multiple fractures of ribs, left side, subsequent encounter for fracture with routine healing: Secondary | ICD-10-CM | POA: Diagnosis not present

## 2018-11-09 DIAGNOSIS — S40012D Contusion of left shoulder, subsequent encounter: Secondary | ICD-10-CM | POA: Diagnosis not present

## 2018-11-09 DIAGNOSIS — S80811D Abrasion, right lower leg, subsequent encounter: Secondary | ICD-10-CM | POA: Diagnosis not present

## 2018-11-13 ENCOUNTER — Other Ambulatory Visit: Payer: Self-pay | Admitting: General Surgery

## 2018-11-13 ENCOUNTER — Other Ambulatory Visit: Payer: Self-pay

## 2018-11-13 ENCOUNTER — Ambulatory Visit
Admission: RE | Admit: 2018-11-13 | Discharge: 2018-11-13 | Disposition: A | Discharge status: Home / Self Care | Payer: Medicare Other | Source: Ambulatory Visit | Attending: General Surgery | Admitting: General Surgery

## 2018-11-13 DIAGNOSIS — J939 Pneumothorax, unspecified: Secondary | ICD-10-CM

## 2018-11-16 DIAGNOSIS — I8390 Asymptomatic varicose veins of unspecified lower extremity: Secondary | ICD-10-CM | POA: Diagnosis not present

## 2018-11-16 DIAGNOSIS — S40012D Contusion of left shoulder, subsequent encounter: Secondary | ICD-10-CM | POA: Diagnosis not present

## 2018-11-16 DIAGNOSIS — D62 Acute posthemorrhagic anemia: Secondary | ICD-10-CM | POA: Diagnosis not present

## 2018-11-16 DIAGNOSIS — M8088XD Other osteoporosis with current pathological fracture, vertebra(e), subsequent encounter for fracture with routine healing: Secondary | ICD-10-CM | POA: Diagnosis not present

## 2018-11-16 DIAGNOSIS — S2242XD Multiple fractures of ribs, left side, subsequent encounter for fracture with routine healing: Secondary | ICD-10-CM | POA: Diagnosis not present

## 2018-11-16 DIAGNOSIS — S80811D Abrasion, right lower leg, subsequent encounter: Secondary | ICD-10-CM | POA: Diagnosis not present

## 2018-11-21 ENCOUNTER — Ambulatory Visit
Admission: RE | Admit: 2018-11-21 | Discharge: 2018-11-21 | Disposition: A | Discharge status: Home / Self Care | Payer: Medicare Other | Source: Ambulatory Visit | Attending: General Surgery | Admitting: General Surgery

## 2018-11-21 ENCOUNTER — Other Ambulatory Visit: Payer: Self-pay | Admitting: General Surgery

## 2018-11-21 DIAGNOSIS — J939 Pneumothorax, unspecified: Secondary | ICD-10-CM

## 2018-11-22 DIAGNOSIS — S2242XD Multiple fractures of ribs, left side, subsequent encounter for fracture with routine healing: Secondary | ICD-10-CM | POA: Diagnosis not present

## 2018-11-22 DIAGNOSIS — M8088XD Other osteoporosis with current pathological fracture, vertebra(e), subsequent encounter for fracture with routine healing: Secondary | ICD-10-CM | POA: Diagnosis not present

## 2018-11-22 DIAGNOSIS — S40012D Contusion of left shoulder, subsequent encounter: Secondary | ICD-10-CM | POA: Diagnosis not present

## 2018-11-22 DIAGNOSIS — I8390 Asymptomatic varicose veins of unspecified lower extremity: Secondary | ICD-10-CM | POA: Diagnosis not present

## 2018-11-22 DIAGNOSIS — S80811D Abrasion, right lower leg, subsequent encounter: Secondary | ICD-10-CM | POA: Diagnosis not present

## 2018-11-22 DIAGNOSIS — D62 Acute posthemorrhagic anemia: Secondary | ICD-10-CM | POA: Diagnosis not present

## 2018-12-07 DIAGNOSIS — M810 Age-related osteoporosis without current pathological fracture: Secondary | ICD-10-CM | POA: Diagnosis not present

## 2018-12-07 DIAGNOSIS — F419 Anxiety disorder, unspecified: Secondary | ICD-10-CM | POA: Diagnosis not present

## 2018-12-07 DIAGNOSIS — M542 Cervicalgia: Secondary | ICD-10-CM | POA: Diagnosis not present

## 2018-12-07 DIAGNOSIS — Z780 Asymptomatic menopausal state: Secondary | ICD-10-CM | POA: Diagnosis not present

## 2018-12-07 DIAGNOSIS — Z0000 Encounter for general adult medical examination without abnormal findings: Secondary | ICD-10-CM | POA: Diagnosis not present

## 2018-12-07 DIAGNOSIS — R2681 Unsteadiness on feet: Secondary | ICD-10-CM | POA: Diagnosis not present

## 2018-12-13 ENCOUNTER — Ambulatory Visit: Payer: PRIVATE HEALTH INSURANCE

## 2019-01-30 DIAGNOSIS — F419 Anxiety disorder, unspecified: Secondary | ICD-10-CM | POA: Diagnosis not present

## 2019-01-30 DIAGNOSIS — E7800 Pure hypercholesterolemia, unspecified: Secondary | ICD-10-CM | POA: Diagnosis not present

## 2019-01-30 DIAGNOSIS — Z0000 Encounter for general adult medical examination without abnormal findings: Secondary | ICD-10-CM | POA: Diagnosis not present

## 2019-01-30 DIAGNOSIS — E039 Hypothyroidism, unspecified: Secondary | ICD-10-CM | POA: Diagnosis not present

## 2019-01-30 DIAGNOSIS — M810 Age-related osteoporosis without current pathological fracture: Secondary | ICD-10-CM | POA: Diagnosis not present

## 2019-02-06 DIAGNOSIS — Z79899 Other long term (current) drug therapy: Secondary | ICD-10-CM | POA: Diagnosis not present

## 2019-02-06 DIAGNOSIS — F419 Anxiety disorder, unspecified: Secondary | ICD-10-CM | POA: Diagnosis not present

## 2019-02-06 DIAGNOSIS — L0390 Cellulitis, unspecified: Secondary | ICD-10-CM | POA: Diagnosis not present

## 2019-02-06 DIAGNOSIS — M542 Cervicalgia: Secondary | ICD-10-CM | POA: Diagnosis not present

## 2019-02-06 DIAGNOSIS — Z0000 Encounter for general adult medical examination without abnormal findings: Secondary | ICD-10-CM | POA: Diagnosis not present

## 2019-02-15 ENCOUNTER — Encounter: Payer: Self-pay | Admitting: Gynecology

## 2019-03-12 ENCOUNTER — Emergency Department (HOSPITAL_COMMUNITY): Payer: Medicare Other

## 2019-03-12 ENCOUNTER — Encounter (HOSPITAL_COMMUNITY): Payer: Self-pay | Admitting: *Deleted

## 2019-03-12 ENCOUNTER — Emergency Department (HOSPITAL_COMMUNITY)
Admission: EM | Admit: 2019-03-12 | Discharge: 2019-03-12 | Disposition: A | Discharge status: Home / Self Care | Payer: Medicare Other | Attending: Emergency Medicine | Admitting: Emergency Medicine

## 2019-03-12 ENCOUNTER — Other Ambulatory Visit: Payer: Self-pay

## 2019-03-12 DIAGNOSIS — Y9301 Activity, walking, marching and hiking: Secondary | ICD-10-CM | POA: Insufficient documentation

## 2019-03-12 DIAGNOSIS — W19XXXA Unspecified fall, initial encounter: Secondary | ICD-10-CM

## 2019-03-12 DIAGNOSIS — Y998 Other external cause status: Secondary | ICD-10-CM | POA: Diagnosis not present

## 2019-03-12 DIAGNOSIS — W010XXA Fall on same level from slipping, tripping and stumbling without subsequent striking against object, initial encounter: Secondary | ICD-10-CM | POA: Insufficient documentation

## 2019-03-12 DIAGNOSIS — S29001A Unspecified injury of muscle and tendon of front wall of thorax, initial encounter: Secondary | ICD-10-CM | POA: Diagnosis not present

## 2019-03-12 DIAGNOSIS — Z79899 Other long term (current) drug therapy: Secondary | ICD-10-CM | POA: Diagnosis not present

## 2019-03-12 DIAGNOSIS — Y92018 Other place in single-family (private) house as the place of occurrence of the external cause: Secondary | ICD-10-CM | POA: Diagnosis not present

## 2019-03-12 DIAGNOSIS — S2232XA Fracture of one rib, left side, initial encounter for closed fracture: Secondary | ICD-10-CM

## 2019-03-12 DIAGNOSIS — Z87891 Personal history of nicotine dependence: Secondary | ICD-10-CM | POA: Insufficient documentation

## 2019-03-12 DIAGNOSIS — S299XXA Unspecified injury of thorax, initial encounter: Secondary | ICD-10-CM | POA: Diagnosis present

## 2019-03-12 MED ORDER — OXYCODONE-ACETAMINOPHEN 5-325 MG PO TABS
1.0000 | ORAL_TABLET | Freq: Once | ORAL | Status: AC
  Administered 2019-03-12: 1 via ORAL
  Filled 2019-03-12: qty 1

## 2019-03-12 MED ORDER — LIDOCAINE 5 % EX PTCH: 1.0000 | MEDICATED_PATCH | CUTANEOUS | 0 refills | Status: DC

## 2019-03-12 NOTE — Discharge Instructions (Signed)
Because of the multiple pain medication prescriptions you been getting from Dr. Maudie Mercury I cannot prescribe you any pain medication today.  However continue using the pain medication you have at home as well as the lidocaine patches.  When you run out give Dr. Julianne Rice office a call.

## 2019-03-12 NOTE — ED Provider Notes (Signed)
[facility] DEPT Provider Note   CSN: [id number] Arrival date & time: 03/12/19  1301     History   Chief Complaint Chief Complaint  Patient presents with  . Fall    HPI Katrina Burnett is a 85 y.o. female.     The history is provided by the patient.  Fall This is a new problem. The current episode started yesterday. The problem occurs constantly. The problem has not changed since onset.Associated symptoms comments: Left rib and back pain.  No syncope, lightheaded or dizziness before or after fall.  Was in the hallway and the lights were off and thinks her feet must have gotten tangled.  She was able to get up and go to bed but worsening pain today. No anticoagulation.. The symptoms are aggravated by bending, twisting and coughing (deep breaths). Relieved by: improved after she took some percocet at home. She has tried rest for the symptoms. The treatment provided no relief.    Past Medical History:  Diagnosis Date  . Allergic rhinitis   . Anxiety   . Arthritis    "hands" (11/25/2016)  . Barrett's esophagus 06/1997  . CAP (community acquired pneumonia) 11/25/2016   "this is my 3rd time having pneumonia" (11/25/2016)  . Chondromalacia   . Chronic lower back pain   . Depression   . Diverticulosis   . GERD (gastroesophageal reflux disease)   . Glaucoma   . History of hiatal hernia   . History of stomach ulcers   . Lumbar compression fracture (H[facility])    L1 and L2  . Osteoporosis   . Pneumonia   . Polymyalgia ([place])   . Tubular adenoma of colon 06/1999  . Vaginitis   . Varicose veins     Patient Active Problem List   Diagnosis Date Noted  . Pneumothorax on left 10/24/2018  . Pneumothorax, left 10/24/2018  . CAP (community acquired pneumonia) 11/25/2016  . Anemia 11/25/2016  . Hyponatremia 11/25/2016  . Fracture, intertrochanteric, right femur ([place]) 05/26/2015  . Closed fracture of femur ([place])   . Post-operative pain   . Acute blood loss  anemia   . Adjustment disorder with mixed anxiety and depressed mood   . Fall   . Abnormality of gait   . Femur fracture, right, closed, initial encounter 05/23/2015  . Fracture of femoral neck, right, closed ([place]) 05/22/2015  . Femoral neck fracture, right, closed, initial encounter 05/22/2015  . Syncope 05/30/2012  . Nausea & vomiting 05/30/2012  . Chest pain 05/30/2012  . Hypotension 05/30/2012  . Esophageal reflux 12/16/2011  . Lumbar compression fracture ([place])   . Polymyalgia ([place])     Past Surgical History:  Procedure Laterality Date  . BACK SURGERY    . CATARACT EXTRACTION W/ INTRAOCULAR LENS  IMPLANT, BILATERAL Bilateral   . FIXATION KYPHOPLASTY LUMBAR SPINE  10/12/10; 09/20/11  . FRACTURE SURGERY    . IR KYPHO THORACIC WITH BONE BIOPSY  03/24/2018  . IR VERTEBROPLASTY CERV/THOR BX INC UNI/BIL INC/INJECT/IMAGING  03/24/2018  . LUMBAR SPINE SURGERY     "did OR on 2 fractures in my lower back"  . LUNG BIOPSY    . ORIF HIP FRACTURE Right 05/23/2015   Procedure: OPEN REDUCTION INTERNAL FIXATION HIP;  Surgeon: Frederik Pear, MD;  Location: [facility];  Service: Orthopedics;  Laterality: Right;  Marland Kitchen VARICOSE VEIN SURGERY     vein ligation and stripping "years ago"     OB History    Gravida  4  Para  4   Term  4   Preterm      AB      Living  4     SAB      TAB      Ectopic      Multiple      Live Births               Home Medications    Prior to Admission medications   Medication Sig Start Date End Date Taking? Authorizing Provider  cetirizine (ZYRTEC) 10 MG tablet Take 10 mg by mouth daily.    [provider]  cholecalciferol (VITAMIN D) 1000 UNITS tablet Take 1,000 Units by mouth at bedtime.     [provider]  citalopram (CELEXA) 40 MG tablet Take 40 mg by mouth daily. 09/12/16   [provider]  dorzolamide (TRUSOPT) 2 % ophthalmic solution Place 1 drop into both eyes 2 (two) times daily.     [provider]   esomeprazole (NEXIUM) 40 MG capsule Take 1 capsule (40 mg total) by mouth daily. 10/28/18   [name], MD  fluticasone [facility]) 50 MCG/ACT nasal spray Place 1 spray into both nostrils daily as needed for allergies.     [provider]  ibuprofen (ADVIL,MOTRIN) 200 MG tablet Take 400 mg by mouth every 6 (six) hours as needed for mild pain.     [provider]  ketoconazole (NIZORAL) 2 % shampoo Apply 1 application topically daily as needed for irritation.  02/10/18   [provider]  latanoprost (XALATAN) 0.005 % ophthalmic solution Place 1 drop into both eyes at bedtime.    [provider]  LORazepam (ATIVAN) 1 MG tablet Take 1 mg by mouth 2 (two) times daily as needed for anxiety.    [provider]  Multiple Vitamin (MULTIVITAMIN) tablet Take 1 tablet by mouth at bedtime.     [provider]  oxyCODONE-acetaminophen (PERCOCET) 5-325 MG tablet Take 1-2 tablets by mouth every 6 (six) hours as needed. 03/02/18   Milton Ferguson, MD  Probiotic Product (ALIGN) 4 MG CAPS Take 4 mg by mouth at bedtime.     [provider]  traZODone (DESYREL) 50 MG tablet Take 0.5-1 tablets (25-50 mg total) by mouth at bedtime as needed for sleep. 10/28/18   [name], MD    Family History Family History  Problem Relation Age of Onset  . Colon cancer Mother 64  . Congestive Heart Failure Mother   . Heart disease Father   . Alcohol abuse Father   . Heart disease Maternal Grandmother   . Fibromyalgia Son     Social History Social History   Tobacco Use  . Smoking status: Former Smoker    Years: 1.00  . Smokeless tobacco: Never Used  . Tobacco comment: At age 29 yrs old but nothing since   Substance Use Topics  . Alcohol use: No  . Drug use: No     Allergies   Avelox [moxifloxacin hcl in nacl], Biaxin [clarithromycin], Cefuroxime, Levaquin [levofloxacin in d5w], Minocycline, Paxil [paroxetine hcl], Prednisone, Remeron [mirtazapine],  Sulfur, Trimox [amoxicillin], and Viibryd [vilazodone hcl]   Review of Systems Review of Systems  All other systems reviewed and are negative.    Physical Exam Updated Vital Signs BP (!) 157/79 (BP Location: Left Arm)   Pulse 81   Temp 98.5 F (36.9 C) (Oral)   Resp 16   Ht 4\' 9"  (1.448 m)   Wt 40.8 kg  SpO2 99%   BMI 19.48 kg/m   Physical Exam Vitals signs and nursing note reviewed.  Constitutional:      General: She is not in acute distress.    Appearance: Normal appearance. She is well-developed and normal weight.  HENT:     Head: Normocephalic and atraumatic.  Eyes:     Pupils: Pupils are equal, round, and reactive to light.  Cardiovascular:     Rate and Rhythm: Normal rate and regular rhythm.     Heart sounds: Normal heart sounds. No murmur. No friction rub.  Pulmonary:     Effort: Pulmonary effort is normal.     Breath sounds: Normal breath sounds. No wheezing or rales.    Chest:     Chest wall: Tenderness present.    Abdominal:     General: Bowel sounds are normal. There is no distension.     Palpations: Abdomen is soft.     Tenderness: There is abdominal tenderness in the left upper quadrant. There is no guarding or rebound.  Musculoskeletal: Normal range of motion.        General: No tenderness.     Right hip: Normal.     Left hip: Normal.     Comments: No edema.  No midline cervical/thoracic or lumbar tenderness.    Skin:    General: Skin is warm and dry.     Findings: No rash.  Neurological:     Mental Status: She is alert and oriented to person, place, and time.     Cranial Nerves: No cranial nerve deficit.  Psychiatric:        Behavior: Behavior normal.      ED Treatments / Results  Labs (all labs ordered are listed, but only abnormal results are displayed) Labs Reviewed - No data to display  EKG None  Radiology Dg Ribs Unilateral W/chest Left  Result Date: 03/12/2019 CLINICAL DATA:  Left flank pain post fall. EXAM: LEFT RIBS  AND CHEST - 3+ VIEW COMPARISON:  11/13/2018 FINDINGS: Lungs are adequately inflated and otherwise clear. Cardiomediastinal silhouette is normal. Old posterior left 7 and eighth rib fractures. Findings suggest a subtle acute minimally displaced left posterolateral ninth rib fracture. Old vertebral compression fractures post kyphoplasty. IMPRESSION: No acute cardiopulmonary disease. Minimally displaced posterolateral left ninth rib fracture. Old left posterior seventh and eighth rib fractures. Electronically Signed   By: Marin Olp M.D.   On: 03/12/2019 14:13    Procedures Procedures (including critical care time)  Medications Ordered in ED Medications  oxyCODONE-acetaminophen (PERCOCET/ROXICET) 5-325 MG per tablet 1 tablet (has no administration in time range)     Initial Impression / Assessment and Plan / ED Course  I have reviewed the triage vital signs and the nursing notes.  Pertinent labs & imaging results that were available during my care of the patient were reviewed by me and considered in my medical decision making (see chart for details).       Elderly female with a fall last night at her home.  She was walking down the hall and the lights were not on and thinks she must of tripped.  She had no preceding symptoms concerning for syncope and denies any head injury or loss of consciousness after the fall.  However she has been having pain in her left side since the fall.  She has been able to ambulate today without any pain in her legs but states the left side pain is severe.  She has no midline tenderness in  her cervical thoracic or lumbar spine concerning for compression fracture.  She does have significant tenderness over the left rib cage.  She also has some left upper quadrant abdominal tenderness.  Patient does not take anticoagulation.  Vital signs are reassuring and sats are 99% on room air.  Patient did take an oxycodone that she has at home this morning around 9 AM and states  it did help with the pain but it is starting to wear off.  Concern for rib fractures are solid organ injury of the left side of the abdomen.  Offered patient a CAT scan of her chest and abdomen to further evaluate and she states she does not want a wait that long she just wants an x-ray.  Explained to her that we could be missing things and she states she understands but all she wants is an x-ray.  Patient was given a dose of pain control and x-ray pending.  2:35 PM Chest x-ray shows a minimally displaced posterior lateral left ninth rib fracture without pneumothorax.  Patient was given lidocaine patches and prescription for pain medication as she states she only has 8 pills left.  Final Clinical Impressions(s) / ED Diagnoses   Final diagnoses:  Fall, initial encounter  Closed fracture of one rib of left side, initial encounter    ED Discharge Orders         Ordered    lidocaine (LIDODERM) 5 %  Every 24 hours     03/12/19 1438           Blanchie Dessert, MD 03/12/19 1438

## 2019-03-12 NOTE — ED Triage Notes (Addendum)
Pt fell at home yesterday, able to get up by self, had anxiety when she fell and feels like she may have passed out, clarifies she did not pass out and fall. [place] more on left side, soreness in mid back  worse today.

## 2019-06-07 ENCOUNTER — Other Ambulatory Visit: Payer: Self-pay | Admitting: Internal Medicine

## 2019-06-07 ENCOUNTER — Other Ambulatory Visit (HOSPITAL_COMMUNITY): Payer: Self-pay | Admitting: Internal Medicine

## 2019-06-07 DIAGNOSIS — G459 Transient cerebral ischemic attack, unspecified: Secondary | ICD-10-CM

## 2019-06-15 ENCOUNTER — Ambulatory Visit (HOSPITAL_COMMUNITY): Payer: Medicare Other

## 2019-06-15 ENCOUNTER — Encounter (HOSPITAL_COMMUNITY): Payer: PRIVATE HEALTH INSURANCE

## 2019-06-15 ENCOUNTER — Encounter (HOSPITAL_COMMUNITY): Payer: Self-pay

## 2019-06-15 ENCOUNTER — Other Ambulatory Visit (HOSPITAL_COMMUNITY): Payer: PRIVATE HEALTH INSURANCE

## 2019-06-29 ENCOUNTER — Ambulatory Visit: Payer: Medicare Other | Admitting: Neurology

## 2019-07-05 ENCOUNTER — Ambulatory Visit: Payer: Medicare Other | Attending: Internal Medicine

## 2019-07-05 DIAGNOSIS — Z23 Encounter for immunization: Secondary | ICD-10-CM | POA: Insufficient documentation

## 2019-07-05 NOTE — Progress Notes (Signed)
   Covid-19 Vaccination Clinic  Name:  Katrina Burnett    MRN: [id number] DOB: 02/21/1925  07/05/2019  [name] was observed post Covid-19 immunization for 15 minutes without incidence. She was provided with Vaccine Information Sheet and instruction to access the V-Safe system.   [name] was instructed to call 911 with any severe reactions post vaccine: Marland Kitchen Difficulty breathing  . Swelling of your face and throat  . A fast heartbeat  . A bad rash all over your body  . Dizziness and weakness    Immunizations Administered    Name Date Dose VIS Date Route   Pfizer COVID-19 Vaccine 07/05/2019 11:14 AM 0.3 mL 04/20/2019 Intramuscular   Manufacturer: [place]   Lot: [id number]   [place]: [id number]

## 2019-07-24 DIAGNOSIS — F419 Anxiety disorder, unspecified: Secondary | ICD-10-CM | POA: Diagnosis not present

## 2019-07-24 DIAGNOSIS — M5136 Other intervertebral disc degeneration, lumbar region: Secondary | ICD-10-CM | POA: Diagnosis not present

## 2019-07-24 DIAGNOSIS — W19XXXA Unspecified fall, initial encounter: Secondary | ICD-10-CM | POA: Diagnosis not present

## 2019-07-24 DIAGNOSIS — F329 Major depressive disorder, single episode, unspecified: Secondary | ICD-10-CM | POA: Diagnosis not present

## 2019-07-24 DIAGNOSIS — G459 Transient cerebral ischemic attack, unspecified: Secondary | ICD-10-CM | POA: Diagnosis not present

## 2019-07-24 DIAGNOSIS — E7800 Pure hypercholesterolemia, unspecified: Secondary | ICD-10-CM | POA: Diagnosis not present

## 2019-07-24 DIAGNOSIS — Z79899 Other long term (current) drug therapy: Secondary | ICD-10-CM | POA: Diagnosis not present

## 2019-07-24 DIAGNOSIS — J449 Chronic obstructive pulmonary disease, unspecified: Secondary | ICD-10-CM | POA: Diagnosis not present

## 2019-07-25 ENCOUNTER — Ambulatory Visit: Payer: Medicare Other | Attending: Internal Medicine

## 2019-07-25 DIAGNOSIS — Z23 Encounter for immunization: Secondary | ICD-10-CM

## 2019-07-25 NOTE — Progress Notes (Signed)
   Covid-19 Vaccination Clinic  Name:  Katrina Burnett    MRN: [id number] DOB: May 30, 1924  07/25/2019  [name] was observed post Covid-19 immunization for 15 minutes without incident. She was provided with Vaccine Information Sheet and instruction to access the V-Safe system.   [name] was instructed to call 911 with any severe reactions post vaccine: Marland Kitchen Difficulty breathing  . Swelling of face and throat  . A fast heartbeat  . A bad rash all over body  . Dizziness and weakness   Immunizations Administered    Name Date Dose VIS Date Route   Pfizer COVID-19 Vaccine 07/25/2019 12:44 PM 0.3 mL 04/20/2019 Intramuscular   Manufacturer: [place]   Lot: [id number]   [place]: [id number]

## 2019-07-31 DIAGNOSIS — R0781 Pleurodynia: Secondary | ICD-10-CM | POA: Diagnosis not present

## 2019-07-31 DIAGNOSIS — S2232XA Fracture of one rib, left side, initial encounter for closed fracture: Secondary | ICD-10-CM | POA: Diagnosis not present

## 2019-07-31 DIAGNOSIS — N390 Urinary tract infection, site not specified: Secondary | ICD-10-CM | POA: Diagnosis not present

## 2019-08-02 DIAGNOSIS — Z79899 Other long term (current) drug therapy: Secondary | ICD-10-CM | POA: Diagnosis not present

## 2019-08-02 DIAGNOSIS — N390 Urinary tract infection, site not specified: Secondary | ICD-10-CM | POA: Diagnosis not present

## 2019-08-28 DIAGNOSIS — N6489 Other specified disorders of breast: Secondary | ICD-10-CM | POA: Diagnosis not present

## 2019-08-28 DIAGNOSIS — R482 Apraxia: Secondary | ICD-10-CM | POA: Diagnosis not present

## 2019-10-15 DIAGNOSIS — E7800 Pure hypercholesterolemia, unspecified: Secondary | ICD-10-CM | POA: Diagnosis not present

## 2019-10-15 DIAGNOSIS — Z0000 Encounter for general adult medical examination without abnormal findings: Secondary | ICD-10-CM | POA: Diagnosis not present

## 2019-11-11 ENCOUNTER — Other Ambulatory Visit: Payer: Self-pay

## 2019-11-11 ENCOUNTER — Emergency Department (HOSPITAL_COMMUNITY): Payer: Medicare Other

## 2019-11-11 ENCOUNTER — Observation Stay (HOSPITAL_COMMUNITY)
Admission: EM | Admit: 2019-11-11 | Discharge: 2019-11-12 | Disposition: A | Discharge status: Home / Self Care | Payer: Medicare Other | Attending: Internal Medicine | Admitting: Internal Medicine

## 2019-11-11 DIAGNOSIS — Z7982 Long term (current) use of aspirin: Secondary | ICD-10-CM | POA: Insufficient documentation

## 2019-11-11 DIAGNOSIS — J309 Allergic rhinitis, unspecified: Secondary | ICD-10-CM | POA: Diagnosis not present

## 2019-11-11 DIAGNOSIS — D72829 Elevated white blood cell count, unspecified: Secondary | ICD-10-CM | POA: Insufficient documentation

## 2019-11-11 DIAGNOSIS — S32591A Other specified fracture of right pubis, initial encounter for closed fracture: Secondary | ICD-10-CM | POA: Insufficient documentation

## 2019-11-11 DIAGNOSIS — F419 Anxiety disorder, unspecified: Secondary | ICD-10-CM | POA: Diagnosis not present

## 2019-11-11 DIAGNOSIS — Z8711 Personal history of peptic ulcer disease: Secondary | ICD-10-CM | POA: Insufficient documentation

## 2019-11-11 DIAGNOSIS — H409 Unspecified glaucoma: Secondary | ICD-10-CM | POA: Diagnosis not present

## 2019-11-11 DIAGNOSIS — M353 Polymyalgia rheumatica: Secondary | ICD-10-CM | POA: Insufficient documentation

## 2019-11-11 DIAGNOSIS — K2100 Gastro-esophageal reflux disease with esophagitis, without bleeding: Secondary | ICD-10-CM | POA: Diagnosis not present

## 2019-11-11 DIAGNOSIS — S32000A Wedge compression fracture of unspecified lumbar vertebra, initial encounter for closed fracture: Secondary | ICD-10-CM | POA: Diagnosis present

## 2019-11-11 DIAGNOSIS — S72001A Fracture of unspecified part of neck of right femur, initial encounter for closed fracture: Secondary | ICD-10-CM

## 2019-11-11 DIAGNOSIS — F4323 Adjustment disorder with mixed anxiety and depressed mood: Secondary | ICD-10-CM | POA: Diagnosis present

## 2019-11-11 DIAGNOSIS — Z66 Do not resuscitate: Secondary | ICD-10-CM | POA: Diagnosis not present

## 2019-11-11 DIAGNOSIS — G8929 Other chronic pain: Secondary | ICD-10-CM | POA: Diagnosis not present

## 2019-11-11 DIAGNOSIS — S32401A Unspecified fracture of right acetabulum, initial encounter for closed fracture: Secondary | ICD-10-CM | POA: Diagnosis not present

## 2019-11-11 DIAGNOSIS — W19XXXA Unspecified fall, initial encounter: Secondary | ICD-10-CM | POA: Insufficient documentation

## 2019-11-11 DIAGNOSIS — Z20822 Contact with and (suspected) exposure to covid-19: Secondary | ICD-10-CM | POA: Diagnosis not present

## 2019-11-11 DIAGNOSIS — K219 Gastro-esophageal reflux disease without esophagitis: Secondary | ICD-10-CM | POA: Diagnosis not present

## 2019-11-11 DIAGNOSIS — R739 Hyperglycemia, unspecified: Secondary | ICD-10-CM | POA: Insufficient documentation

## 2019-11-11 DIAGNOSIS — D649 Anemia, unspecified: Secondary | ICD-10-CM | POA: Diagnosis not present

## 2019-11-11 DIAGNOSIS — Z87891 Personal history of nicotine dependence: Secondary | ICD-10-CM | POA: Diagnosis not present

## 2019-11-11 DIAGNOSIS — Z79899 Other long term (current) drug therapy: Secondary | ICD-10-CM | POA: Insufficient documentation

## 2019-11-11 DIAGNOSIS — K5900 Constipation, unspecified: Secondary | ICD-10-CM | POA: Diagnosis not present

## 2019-11-11 DIAGNOSIS — N1831 Chronic kidney disease, stage 3a: Secondary | ICD-10-CM | POA: Insufficient documentation

## 2019-11-11 DIAGNOSIS — N179 Acute kidney failure, unspecified: Secondary | ICD-10-CM | POA: Insufficient documentation

## 2019-11-11 DIAGNOSIS — S72141A Displaced intertrochanteric fracture of right femur, initial encounter for closed fracture: Secondary | ICD-10-CM | POA: Diagnosis not present

## 2019-11-11 DIAGNOSIS — Z03818 Encounter for observation for suspected exposure to other biological agents ruled out: Secondary | ICD-10-CM | POA: Diagnosis not present

## 2019-11-11 DIAGNOSIS — M545 Low back pain: Secondary | ICD-10-CM | POA: Diagnosis not present

## 2019-11-11 DIAGNOSIS — S329XXA Fracture of unspecified parts of lumbosacral spine and pelvis, initial encounter for closed fracture: Secondary | ICD-10-CM | POA: Diagnosis present

## 2019-11-11 LAB — CBC WITH DIFFERENTIAL/PLATELET
Abs Immature Granulocytes: 0.04 10*3/uL (ref 0.00–0.07)
Basophils Absolute: 0 10*3/uL (ref 0.0–0.1)
Basophils Relative: 0 %
Eosinophils Absolute: 0 10*3/uL (ref 0.0–0.5)
Eosinophils Relative: 0 %
HCT: 28.6 % — ABNORMAL LOW (ref 36.0–46.0)
Hemoglobin: 9 g/dL — ABNORMAL LOW (ref 12.0–15.0)
Immature Granulocytes: 0 %
Lymphocytes Relative: 6 %
Lymphs Abs: 0.6 10*3/uL — ABNORMAL LOW (ref 0.7–4.0)
MCH: 27.9 pg (ref 26.0–34.0)
MCHC: 31.5 g/dL (ref 30.0–36.0)
MCV: 88.5 fL (ref 80.0–100.0)
Monocytes Absolute: 0.5 10*3/uL (ref 0.1–1.0)
Monocytes Relative: 5 %
Neutro Abs: 9.1 10*3/uL — ABNORMAL HIGH (ref 1.7–7.7)
Neutrophils Relative %: 89 %
Platelets: 243 10*3/uL (ref 150–400)
RBC: 3.23 MIL/uL — ABNORMAL LOW (ref 3.87–5.11)
RDW: 15.2 % (ref 11.5–15.5)
WBC: 10.2 10*3/uL (ref 4.0–10.5)
nRBC: 0 % (ref 0.0–0.2)

## 2019-11-11 LAB — TYPE AND SCREEN
ABO/RH(D): O POS
Antibody Screen: NEGATIVE

## 2019-11-11 LAB — BASIC METABOLIC PANEL
Anion gap: 12 (ref 5–15)
BUN: 25 mg/dL — ABNORMAL HIGH (ref 8–23)
CO2: 23 mmol/L (ref 22–32)
Calcium: 9 mg/dL (ref 8.9–10.3)
Chloride: 101 mmol/L (ref 98–111)
Creatinine, Ser: 1.1 mg/dL — ABNORMAL HIGH (ref 0.44–1.00)
GFR calc Af Amer: 50 mL/min — ABNORMAL LOW (ref >60)
GFR calc non Af Amer: 43 mL/min — ABNORMAL LOW (ref >60)
Glucose, Bld: 132 mg/dL — ABNORMAL HIGH (ref 70–99)
Potassium: 4.7 mmol/L (ref 3.5–5.1)
Sodium: 136 mmol/L (ref 135–145)

## 2019-11-11 LAB — PROTIME-INR
INR: 1 (ref 0.8–1.2)
Prothrombin Time: 12.9 seconds (ref 11.4–15.2)

## 2019-11-11 LAB — SARS CORONAVIRUS 2 BY RT PCR (HOSPITAL ORDER, PERFORMED IN ~~LOC~~ HOSPITAL LAB): SARS Coronavirus 2: NEGATIVE

## 2019-11-11 MED ORDER — BISACODYL 10 MG RE SUPP: 10.0000 mg | Freq: Every day | RECTAL | Status: DC | PRN

## 2019-11-11 MED ORDER — LATANOPROST 0.005 % OP SOLN
1.0000 [drp] | Freq: Every day | OPHTHALMIC | Status: DC
  Administered 2019-11-11: 1 [drp] via OPHTHALMIC
  Filled 2019-11-11: qty 2.5

## 2019-11-11 MED ORDER — DOCUSATE SODIUM 100 MG PO CAPS
100.0000 mg | ORAL_CAPSULE | Freq: Two times a day (BID) | ORAL | Status: DC
  Administered 2019-11-11 – 2019-11-12 (×2): 100 mg via ORAL
  Filled 2019-11-11 (×2): qty 1

## 2019-11-11 MED ORDER — ENOXAPARIN SODIUM 30 MG/0.3ML ~~LOC~~ SOLN
30.0000 mg | SUBCUTANEOUS | Status: DC
  Administered 2019-11-11: 30 mg via SUBCUTANEOUS
  Filled 2019-11-11: qty 0.3

## 2019-11-11 MED ORDER — BISACODYL 10 MG RE SUPP
10.0000 mg | Freq: Once | RECTAL | Status: DC
  Filled 2019-11-11: qty 1

## 2019-11-11 MED ORDER — FENTANYL CITRATE (PF) 100 MCG/2ML IJ SOLN: 25.0000 ug | Freq: Once | INTRAMUSCULAR | Status: DC

## 2019-11-11 MED ORDER — RISAQUAD PO CAPS
1.0000 | ORAL_CAPSULE | Freq: Every day | ORAL | Status: DC
  Administered 2019-11-11: 1 via ORAL
  Filled 2019-11-11: qty 1

## 2019-11-11 MED ORDER — FLUTICASONE PROPIONATE 50 MCG/ACT NA SUSP
1.0000 | Freq: Every day | NASAL | Status: DC | PRN
  Filled 2019-11-11 (×2): qty 16

## 2019-11-11 MED ORDER — MORPHINE SULFATE (PF) 2 MG/ML IV SOLN
0.5000 mg | INTRAVENOUS | Status: DC | PRN
  Administered 2019-11-12 (×2): 0.5 mg via INTRAVENOUS
  Filled 2019-11-11 (×2): qty 1

## 2019-11-11 MED ORDER — METHOCARBAMOL 500 MG PO TABS
500.0000 mg | ORAL_TABLET | Freq: Four times a day (QID) | ORAL | Status: DC | PRN
  Administered 2019-11-12: 500 mg via ORAL
  Filled 2019-11-11: qty 1

## 2019-11-11 MED ORDER — ACETAMINOPHEN 500 MG PO TABS
500.0000 mg | ORAL_TABLET | Freq: Four times a day (QID) | ORAL | Status: DC | PRN
  Administered 2019-11-12: 500 mg via ORAL
  Filled 2019-11-11: qty 1

## 2019-11-11 MED ORDER — SENNOSIDES-DOCUSATE SODIUM 8.6-50 MG PO TABS
1.0000 | ORAL_TABLET | Freq: Two times a day (BID) | ORAL | Status: DC
  Administered 2019-11-11: 1 via ORAL
  Filled 2019-11-11 (×2): qty 1

## 2019-11-11 MED ORDER — ALIGN 4 MG PO CAPS: 4.0000 mg | ORAL_CAPSULE | Freq: Every day | ORAL | Status: DC

## 2019-11-11 MED ORDER — ADULT MULTIVITAMIN W/MINERALS CH
1.0000 | ORAL_TABLET | Freq: Every day | ORAL | Status: DC
  Administered 2019-11-11: 1 via ORAL
  Filled 2019-11-11: qty 1

## 2019-11-11 MED ORDER — ALUM & MAG HYDROXIDE-SIMETH 200-200-20 MG/5ML PO SUSP
30.0000 mL | Freq: Once | ORAL | Status: AC
  Administered 2019-11-11: 30 mL via ORAL
  Filled 2019-11-11: qty 30

## 2019-11-11 MED ORDER — OXYCODONE-ACETAMINOPHEN 5-325 MG PO TABS
1.0000 | ORAL_TABLET | Freq: Four times a day (QID) | ORAL | Status: DC | PRN
  Administered 2019-11-11 – 2019-11-12 (×3): 1 via ORAL
  Filled 2019-11-11 (×3): qty 1

## 2019-11-11 MED ORDER — VITAMIN D 25 MCG (1000 UNIT) PO TABS
1000.0000 [IU] | ORAL_TABLET | Freq: Every day | ORAL | Status: DC
  Administered 2019-11-11: 1000 [IU] via ORAL
  Filled 2019-11-11: qty 1

## 2019-11-11 MED ORDER — SODIUM CHLORIDE 0.9 % IV SOLN: INTRAVENOUS | Status: AC

## 2019-11-11 MED ORDER — LORATADINE 10 MG PO TABS
10.0000 mg | ORAL_TABLET | Freq: Every day | ORAL | Status: DC
  Administered 2019-11-11 – 2019-11-12 (×2): 10 mg via ORAL
  Filled 2019-11-11 (×2): qty 1

## 2019-11-11 MED ORDER — POLYETHYLENE GLYCOL 3350 17 G PO PACK: 17.0000 g | PACK | Freq: Every day | ORAL | Status: DC | PRN

## 2019-11-11 MED ORDER — METHOCARBAMOL 1000 MG/10ML IJ SOLN
500.0000 mg | Freq: Four times a day (QID) | INTRAVENOUS | Status: DC | PRN
  Filled 2019-11-11: qty 5

## 2019-11-11 MED ORDER — ASPIRIN EC 81 MG PO TBEC
81.0000 mg | DELAYED_RELEASE_TABLET | Freq: Every day | ORAL | Status: DC
  Administered 2019-11-11 – 2019-11-12 (×2): 81 mg via ORAL
  Filled 2019-11-11 (×2): qty 1

## 2019-11-11 MED ORDER — TRAZODONE HCL 50 MG PO TABS
50.0000 mg | ORAL_TABLET | Freq: Every evening | ORAL | Status: DC | PRN
  Administered 2019-11-11: 50 mg via ORAL
  Filled 2019-11-11: qty 1

## 2019-11-11 MED ORDER — DORZOLAMIDE HCL 2 % OP SOLN
1.0000 [drp] | Freq: Two times a day (BID) | OPHTHALMIC | Status: DC
  Administered 2019-11-11 – 2019-11-12 (×2): 1 [drp] via OPHTHALMIC
  Filled 2019-11-11: qty 10

## 2019-11-11 MED ORDER — ONE-DAILY MULTI VITAMINS PO TABS: 1.0000 | ORAL_TABLET | Freq: Every day | ORAL | Status: DC

## 2019-11-11 MED ORDER — MAGNESIUM CITRATE PO SOLN: 1.0000 | Freq: Once | ORAL | Status: DC | PRN

## 2019-11-11 NOTE — ED Notes (Signed)
[phone] call Jerene Pitch.

## 2019-11-11 NOTE — H&P (Signed)
History and Physical    Katrina Burnett GHW:[id number] DOB: Oct 12, 1924 DOA: 11/11/2019  PCP: Jani Gravel, MD   Patient coming from: Home  Chief Complaint: Hip Pain   HPI: Katrina Burnett is a 86 y.o. female with medical history significant of anxiety and depression, arthritis, history of Barrett's esophagus, GERD, hiatal hernia, chronic lower back pain, history of lumbar compression fracture L1-L2, osteoporosis, diverticulosis, as well as other comorbidities who presents to the hospital for evaluation for right-sided hip pain.  Patient had a mechanical fall 3 days ago and since then she has had significantly worsening pain.  She continues to ambulate but pain has worsened.  She denies any nausea or vomiting but states that she has not had a bowel movement about 5 days and has been taking Dulcolax pills.  The patient states that the pain is a 0 out of 10 currently after she received pain medication.  She denies any chest pain, shortness breath, lightheadedness or dizziness and states that she did not hit her head when she fell.  At home she has been getting around but has been a little bit more difficult to move and has some discomfort with walking but still able to walk.  She states that she still likes to do outdoor activities and does gardening and goes for walks and used to ride her bike. [facility] was was called to admit this patient for Her Right Hip Pain due to Pelvic Fracture and Ortho has been consulted for further evaluation and recommendations.  ED Course: In the ED she had basic blood work done along with hip x-ray and a Covid test which was negative.  Ortho was consulted and Dr. Sharol Given recommended weightbearing as tolerated deformity see the patient in the morning  Review of Systems: As per HPI otherwise all other systems reviewed and negative.   Past Medical History:  Diagnosis Date  . Allergic rhinitis   . Anxiety   . Arthritis    "hands" (11/25/2016)  . Barrett's esophagus 06/1997  . CAP  (community acquired pneumonia) 11/25/2016   "this is my 3rd time having pneumonia" (11/25/2016)  . Chondromalacia   . Chronic lower back pain   . Depression   . Diverticulosis   . GERD (gastroesophageal reflux disease)   . Glaucoma   . History of hiatal hernia   . History of stomach ulcers   . Lumbar compression fracture (HCC)    L1 and L2  . Osteoporosis   . Pneumonia   . Polymyalgia ([place])   . Tubular adenoma of colon 06/1999  . Vaginitis   . Varicose veins    Past Surgical History:  Procedure Laterality Date  . BACK SURGERY    . CATARACT EXTRACTION W/ INTRAOCULAR LENS  IMPLANT, BILATERAL Bilateral   . FIXATION KYPHOPLASTY LUMBAR SPINE  10/12/10; 09/20/11  . FRACTURE SURGERY    . IR KYPHO THORACIC WITH BONE BIOPSY  03/24/2018  . IR VERTEBROPLASTY CERV/THOR BX INC UNI/BIL INC/INJECT/IMAGING  03/24/2018  . LUMBAR SPINE SURGERY     "did OR on 2 fractures in my lower back"  . LUNG BIOPSY    . ORIF HIP FRACTURE Right 05/23/2015   Procedure: OPEN REDUCTION INTERNAL FIXATION HIP;  Surgeon: Frederik Pear, MD;  Location: [facility];  Service: Orthopedics;  Laterality: Right;  Marland Kitchen VARICOSE VEIN SURGERY     vein ligation and stripping "years ago"   SOCIAL HISTORY  reports that she has quit smoking. She quit after 1.00 year of use. She has  never used smokeless tobacco. She reports that she does not drink alcohol and does not use drugs.  Allergies  Allergen Reactions  . Avelox [Moxifloxacin Hcl In Nacl] Other (See Comments)    Felt bad  . Biaxin [Clarithromycin] Nausea Only  . Cefuroxime Diarrhea  . Levaquin [Levofloxacin In D5w] Diarrhea  . Minocycline Nausea And Vomiting  . Paxil [Paroxetine Hcl] Nausea And Vomiting  . Prednisone Nausea Only  . Remeron [Mirtazapine]     Doesn't remember   . Sulfur Nausea And Vomiting  . Trimox [Amoxicillin] Other (See Comments)    Doesn't remember  Has patient had a PCN reaction causing immediate rash, facial/tongue/throat swelling, SOB or lightheadedness  with hypotension: Unknown Has patient had a PCN reaction causing severe rash involving mucus membranes or skin necrosis: Unknown Has patient had a PCN reaction that required hospitalization: Unknown Has patient had a PCN reaction occurring within the last 10 years: Unknown If all of the above answers are "NO", then may proceed with Cephalosporin use.  Arman Filter Hcl] Other (See Comments)    Doesn't remember    Family History  Problem Relation Age of Onset  . Colon cancer Mother 34  . Congestive Heart Failure Mother   . Heart disease Father   . Alcohol abuse Father   . Heart disease Maternal Grandmother   . Fibromyalgia Son    Prior to Admission medications   Medication Sig Start Date End Date Taking? Authorizing Provider  acetaminophen (TYLENOL) 500 MG tablet Take 500-1,000 mg by mouth every 6 (six) hours as needed for moderate pain.   Yes [provider]  aspirin EC 81 MG tablet Take 81 mg by mouth daily. Swallow whole.   Yes [provider]  cetirizine (ZYRTEC) 10 MG tablet Take 10 mg by mouth daily as needed for allergies.    Yes [provider]  cholecalciferol (VITAMIN D) 1000 UNITS tablet Take 1,000 Units by mouth at bedtime.    Yes [provider]  dorzolamide (TRUSOPT) 2 % ophthalmic solution Place 1 drop into both eyes 2 (two) times daily.    Yes [provider]  fluticasone (FLONASE) 50 MCG/ACT nasal spray Place 1 spray into both nostrils daily as needed for allergies.    Yes [provider]  ibuprofen (ADVIL,MOTRIN) 200 MG tablet Take 400 mg by mouth every 6 (six) hours as needed for mild pain.    Yes [provider]  latanoprost (XALATAN) 0.005 % ophthalmic solution Place 1 drop into both eyes at bedtime.   Yes [provider]  Multiple Vitamin (MULTIVITAMIN) tablet Take 1 tablet by mouth at bedtime.    Yes [provider]  oxyCODONE-acetaminophen (PERCOCET) 5-325 MG tablet Take 1-2  tablets by mouth every 6 (six) hours as needed. Patient taking differently: Take 1 tablet by mouth every 6 (six) hours as needed for moderate pain or severe pain.  03/02/18  Yes Milton [place], MD  Probiotic Product (ALIGN) 4 MG CAPS Take 4 mg by mouth at bedtime.    Yes [provider]  simethicone (MYLICON) 621 MG chewable tablet Chew 125 mg by mouth in the morning and at bedtime.   Yes [provider]  traMADol (ULTRAM) 50 MG tablet Take 25 mg by mouth 2 (two) times daily as needed for moderate pain.  10/05/19  Yes [provider]  traZODone (DESYREL) 50 MG tablet Take 0.5-1 tablets (25-50 mg total) by mouth at bedtime as needed for sleep. Patient taking differently: Take 50  mg by mouth at bedtime as needed for sleep.  10/28/18  Yes [name], MD  citalopram (CELEXA) 40 MG tablet Take 40 mg by mouth daily. Patient not taking: Reported on 11/11/2019 09/12/16   [provider]  cyclobenzaprine (FLEXERIL) 10 MG tablet Take 10 mg by mouth 3 (three) times daily. Patient not taking: Reported on 11/11/2019 11/05/19   [provider]  esomeprazole (NEXIUM) 40 MG capsule Take 1 capsule (40 mg total) by mouth daily. Patient not taking: Reported on 11/11/2019 10/28/18   [name], MD  ketoconazole (NIZORAL) 2 % shampoo Apply 1 application topically daily as needed for irritation.  Patient not taking: Reported on 11/11/2019 02/10/18   [provider]  lidocaine (LIDODERM) 5 % Place 1 patch onto the skin daily. Remove & Discard patch within 12 hours or as directed by MD Patient not taking: Reported on 11/11/2019 03/12/19   Blanchie Dessert, MD  LORazepam (ATIVAN) 1 MG tablet Take 1 mg by mouth 2 (two) times daily as needed for anxiety. Patient not taking: Reported on 11/11/2019    [provider]  pravastatin (PRAVACHOL) 10 MG tablet Take 10 mg by mouth daily. Patient not taking: Reported on 11/11/2019 10/02/19   [provider]   Physical  Exam: Vitals:   11/11/19 1445 11/11/19 1547 11/11/19 1745 11/11/19 1809  BP: 129/83 (!) 145/79 (!) 159/91 (!) 156/87  Pulse: 87 84 95 93  Resp: 17 (!) 21 15 16   Temp:  97.9 F (36.6 C)  99 F (37.2 C)  TempSrc:  Oral  Oral  SpO2: 91% 92% 98% 96%   Constitutional: Patient is extremely thin Caucasian female who is currently in NAD and appears anxious and upset when I walked in but did calm down and does appear a little uncomfortable Eyes: Lids and conjunctivae normal, sclerae anicteric  ENMT: External Ears, Nose appear normal. Grossly normal hearing.  Neck: Appears normal, supple, no cervical masses, normal ROM, no appreciable thyromegaly; no JVD Respiratory: Diminished to auscultation bilaterally, no wheezing, rales, rhonchi or crackles. Normal respiratory effort and patient is not tachypenic. No accessory muscle use.  Unlabored breathing Cardiovascular: RRR, no murmurs / rubs / gallops. S1 and S2 auscultated.  Very mild lower extremity edema bilaterally Abdomen: Soft, non-tender, non-distended. Bowel sounds positive.  GU: Deferred. Musculoskeletal: No clubbing / cyanosis of digits/nails.  She has some pain on palpation of the right side of her hip skin Skin: No rashes, lesions, ulcers on limited skin evaluation. No induration; Warm and dry.  Neurologic: CN 2-12 grossly intact with no focal deficits. Romberg sign and cerebellar reflexes not assessed.  Psychiatric: Normal judgment and insight. Alert and oriented x 3.  Originally was anxious was anxious but that had a calm mood mood and appropriate affect.   Labs on Admission: I have personally reviewed following labs and imaging studies  CBC: Recent Labs  Lab 11/11/19 1319  WBC 10.2  NEUTROABS 9.1*  HGB 9.0*  HCT 28.6*  MCV 88.5  PLT 761   Basic Metabolic Panel: Recent Labs  Lab 11/11/19 1319  NA 136  K 4.7  CL 101  CO2 23  GLUCOSE 132*  BUN 25*  CREATININE 1.10*  CALCIUM 9.0   GFR: CrCl cannot be calculated (Unknown  ideal weight.). Liver Function Tests: No results for input(s): AST, ALT, ALKPHOS, BILITOT, PROT, ALBUMIN in the last 168 hours. No results for input(s): LIPASE, AMYLASE in the last 168 hours. No results for input(s): AMMONIA in the last 168 hours.  Coagulation Profile: Recent Labs  Lab 11/11/19 1319  INR 1.0   Cardiac Enzymes: No results for input(s): CKTOTAL, CKMB, CKMBINDEX, TROPONINI in the last 168 hours. BNP (last 3 results) No results for input(s): PROBNP in the last 8760 hours. HbA1C: No results for input(s): HGBA1C in the last 72 hours. CBG: No results for input(s): GLUCAP in the last 168 hours. Lipid Profile: No results for input(s): CHOL, HDL, LDLCALC, TRIG, CHOLHDL, LDLDIRECT in the last 72 hours. Thyroid Function Tests: No results for input(s): TSH, T4TOTAL, FREET4, T3FREE, THYROIDAB in the last 72 hours. Anemia Panel: No results for input(s): VITAMINB12, FOLATE, FERRITIN, TIBC, IRON, RETICCTPCT in the last 72 hours. Urine analysis:    Component Value Date/Time   COLORURINE YELLOW 11/25/2016 1803   APPEARANCEUR CLEAR 11/25/2016 1803   LABSPEC 1.013 11/25/2016 1803   PHURINE 6.0 11/25/2016 1803   GLUCOSEU NEGATIVE 11/25/2016 1803   HGBUR SMALL (A) 11/25/2016 1803   BILIRUBINUR NEGATIVE 11/25/2016 1803   KETONESUR NEGATIVE 11/25/2016 1803   PROTEINUR 30 (A) 11/25/2016 1803   UROBILINOGEN 0.2 05/24/2013 1634   NITRITE NEGATIVE 11/25/2016 1803   LEUKOCYTESUR NEGATIVE 11/25/2016 1803   Sepsis Labs: !!!!!!!!!!!!!!!!!!!!!!!!!!!!!!!!!!!!!!!!!!!! @LABRCNTIP (procalcitonin:4,lacticidven:4) ) Recent Results (from the past 240 hour(s))  SARS Coronavirus 2 by RT PCR (hospital order, performed in [place] hospital lab) Nasopharyngeal Nasopharyngeal Swab     Status: None   Collection Time: 11/11/19  3:13 PM   Specimen: Nasopharyngeal Swab  Result Value Ref Range Status   SARS Coronavirus 2 NEGATIVE NEGATIVE Final    Comment: (NOTE) SARS-CoV-2 target nucleic acids are  NOT DETECTED.  The SARS-CoV-2 RNA is generally detectable in upper and lower respiratory specimens during the acute phase of infection. The lowest concentration of SARS-CoV-2 viral copies this assay can detect is 250 copies / mL. A negative result does not preclude SARS-CoV-2 infection and should not be used as the sole basis for treatment or other patient management decisions.  A negative result may occur with improper specimen collection / handling, submission of specimen other than nasopharyngeal swab, presence of viral mutation(s) within the areas targeted by this assay, and inadequate number of viral copies (<250 copies / mL). A negative result must be combined with clinical observations, patient history, and epidemiological information.  Fact Sheet for Patients:   StrictlyIdeas.no  Fact Sheet for Healthcare Providers: BankingDealers.co.za  This test is not yet approved or  cleared by the [place] FDA and has been authorized for detection and/or diagnosis of SARS-CoV-2 by FDA under an Emergency Use Authorization (EUA).  This EUA will remain in effect (meaning this test can be used) for the duration of the COVID-19 declaration under Section 564(b)(1) of the Act, 21 U.S.C. section 360bbb-3(b)(1), unless the authorization is terminated or revoked sooner.  Performed at [facility], [place] [address] [place], [place] [address]     Radiological Exams on Admission: DG Chest Port 1 View  Result Date: 11/11/2019 CLINICAL DATA:  Right pelvic fractures, fall EXAM: PORTABLE CHEST 1 VIEW COMPARISON:  11/13/2018 FINDINGS: Stable mild cardiomegaly without CHF. No focal pneumonia. Lungs remain clear. No large effusion or pneumothorax. Trachea midline. Aorta atherosclerotic. Bones are osteopenic. Previous vertebral augmentation changes noted of the spine. IMPRESSION: Mild cardiomegaly without acute chest process.  Stable exam.  Electronically Signed   By: Jerilynn Mages.  Shick M.D.   On: 11/11/2019 13:46   DG Hip Unilat With Pelvis 2-3 Views Right  Result Date: 11/11/2019 CLINICAL DATA:  86 year old female with hip pain on  the right EXAM: DG HIP (WITH OR WITHOUT PELVIS) 2-3V RIGHT COMPARISON:  05/23/2015 FINDINGS: Osteopenia. Fracture of the right pelvis involving the acetabulum, with disruption of the iliopectineal line. Fracture of the inferior pubic ramus. Bilateral hips projects normally over the acetabula. Surgical changes of right hip fixation, with dynamic plate/screw fixation. Lucency present at the femoral head/neck screw IMPRESSION: Right pelvic fracture involving the acetabulum, as well as the right inferior pubic ramus. CT imaging is indicated. Surgical changes of prior right hip fixation, with some lucency at the femoral neck/head screw. Electronically Signed   By: Corrie Mckusick D.O.   On: 11/11/2019 13:46   EKG: Independently reviewed.  Sinus rhythm rate of 90 and a QTC of 446.  There is low voltage but no evidence of ST elevation on my interpretation  Assessment/Plan Active Problems:   Lumbar compression fracture (HCC)   Esophageal reflux   Adjustment disorder with mixed anxiety and depressed mood   Fall   Pelvic fracture (HCC)  Right Hip Pain in the setting of Fall Right Pelvic Fracture including the acetabulum as well as the Inferior Pubic Ramus -Place in observation MedSurg  -She is hemodynamically stable at this time  -Hip x-ray showed a right pelvic fracture involving the acetabulum as well as right inferior pubic ramus; CT imaging was indicated but I discussed the case with Dr. Sharol Given who feels that we do not need to follow-up with CT imaging as this is likely nonoperative next-he recommends weightbearing as tolerated and pain control along with PT and OT but he states that he formally see the patient in the a.m. for further direction -PT OT to further evaluate and treat; transition of care consulted    -Weightbearing as tolerated per Ortho recommendations -Continue with pain control with acetaminophen, oxycodone-acetaminophen, as well as and IV morphine 0.5 mg every 4 hours as needed for severe pain if p.o. ineffective -Hip fracture order set utilized -Continue wit DVT prophylaxis with Geralynn Ochs apparent 30 mg subcu nightly CDs -Continue with vitamin D and check vitamin D level in a.m. -Bowel regimen as below for constipation -Further Ortho evaluation; likely this is nonoperative and she can be discharged in next 24 to 48 hours once she is evaluated by PT and OT and to see what equipment she will need for home  AKI on CKD stage IIIa -Upon further review of her creatinine it ranges anywhere from 0.7-0.9 -Last year her creatinine was 16/0.73 and today it is now 625/1.10 -We will give very gentle IV fluid hydration 50 MLS per hour for 12 hours -Avoid nephrotoxic medications, contrast dyes, hypotension and renally dose medications -Repeat CMP in a.m. and continue to monitor renal function carefully  Hyperglycemia -Patient's glucose is 132 -Continue monitor blood sugars carefully and will not check a hemoglobin A1c -If necessary will place on sensitive NovoLog sign scale AC  Normocytic Anemia -Patient's Hemoglobin/Hematocrit is 9.0/20.6; it dropped from earlier last month when it was 10.3/33.0 -We will hold her ibuprofen for now -Check anemia panel in the a.m. -Continue to monitor for signs and symptoms of bleeding; currently no overt bleeding noted -Repeat CBC in a.m.  Constipation -Likely in the setting of opioids  -She tried to Dulcolax pills without relief next-we will add senna docusate 1 tab p.o. twice daily and give her as a rectal suppository bisacodyl 10 mg once now -We will also add MiraLAX as needed  Depression and Anxiety -She is no longer taking citalopram 40 mg p.o. daily and is no longer taking  lorazepam 1 mg p.o. twice daily  Glaucoma -Continue with dorzolamide 2%  ophthalmic solution 1 drop in both eyes twice daily as well as latanoprost 0.005% ophthalmic solution 1 drop in both eyes nightly  GERD/Barrett's esophagus/history of hiatal hernia/history of stomach ulcers -If needed we will order her a PPI but she is no longer taking her esomeprazole 40 mg p.o. daily -She takes simethicone 125 mg chewable tablet in the morning and nightly which we have held -If needed  Chronic low back pain, history of lumbar compression fracture at L1-L2/arthritis and polymyalgia -She is no longer taking cyclobenzaprine 10 mg p.o. 3 times daily; will add Robaxin 500 mg p.o./IV every 6 as needed -Continue with acetaminophen [phone] mg every 6 hours as needed for moderate pain as well as oxycodone-acetaminophen 1-2 tabs every 6 hours as needed for moderate to severe pain -We will hold her ibuprofen 400 mg every 6; she is no longer taking tramadol 25 mg p.o. twice daily as needed  DVT prophylaxis: Enoxaparin 30 mg sq q24h Code Status: Patient wishes to be DO NOT RESUSCITATE Family Communication: No family present at bedside Disposition Plan: Pending further evaluation by PT and OT Consults called: Dr. Meridee Score of Orthopedic Surgery  Admission status: Observation Med-Surge  Severity of Illness: The appropriate patient status for this patient is OBSERVATION. Observation status is judged to be reasonable and necessary in order to provide the required intensity of service to ensure the patient's safety. The patient's presenting symptoms, physical exam findings, and initial radiographic and laboratory data in the context of their medical condition is felt to place them at decreased risk for further clinical deterioration. Furthermore, it is anticipated that the patient will be medically stable for discharge from the hospital within 2 midnights of admission. The following factors support the patient status of observation.   " The patient's presenting symptoms include right hip  pain. " The physical exam findings include anxiety. " The initial radiographic and laboratory data are showing a right pelvic fracture .  Kerney Elbe, D.O. [facility]AG[facility] is on [place]  If 7PM-7AM, please contact night-coverage [email or web address]  11/11/2019, 6:17 PM

## 2019-11-11 NOTE — ED Notes (Signed)
Work number for patient's son. [phone]

## 2019-11-11 NOTE — ED Provider Notes (Signed)
[facility] DEPT Provider Note   CSN: [id number] Arrival date & time: 11/11/19  1252     History Chief Complaint  Patient presents with   Karin Lieu ALBIRTHA GRINAGE is a 86 y.o. female.  HPI     86 year old female comes from her home with chief complaint of fall and hip pain.  I have also spoken with patient's son, Mr. Zenia Resides.  Patient reports that she had a mechanical fall in her bathroom few days back.  Since then she has been having pain in her right hip.  The pain is severe and has impacted her ability to move around.  She is still walking with a lot of discomfort.  In her chart review it is noted that patient is prescribed high-dose narcotic medications and also Ativan.  Patient reports that she has been taking those medications as needed, and despite taking them she has had some pain.   Past Medical History:  Diagnosis Date   Allergic rhinitis    Anxiety    Arthritis    "hands" (11/25/2016)   Barrett's esophagus 06/1997   CAP (community acquired pneumonia) 11/25/2016   "this is my 3rd time having pneumonia" (11/25/2016)   Chondromalacia    Chronic lower back pain    Depression    Diverticulosis    GERD (gastroesophageal reflux disease)    Glaucoma    History of hiatal hernia    History of stomach ulcers    Lumbar compression fracture (HCC)    L1 and L2   Osteoporosis    Pneumonia    Polymyalgia ([place])    Tubular adenoma of colon 06/1999   Vaginitis    Varicose veins     Patient Active Problem List   Diagnosis Date Noted   Pelvic fracture ([place]) 11/11/2019   Pneumothorax on left 10/24/2018   Pneumothorax, left 10/24/2018   CAP (community acquired pneumonia) 11/25/2016   Anemia 11/25/2016   Hyponatremia 11/25/2016   Fracture, intertrochanteric, right femur ([place]) 05/26/2015   Closed fracture of femur ([place])    Post-operative pain    Acute blood loss anemia    Adjustment disorder with mixed anxiety and  depressed mood    Fall    Abnormality of gait    Femur fracture, right, closed, initial encounter 05/23/2015   Fracture of femoral neck, right, closed ([place]) 05/22/2015   Femoral neck fracture, right, closed, initial encounter 05/22/2015   Syncope 05/30/2012   Nausea & vomiting 05/30/2012   Chest pain 05/30/2012   Hypotension 05/30/2012   Esophageal reflux 12/16/2011   Lumbar compression fracture ([place])    Polymyalgia ([place])     Past Surgical History:  Procedure Laterality Date   BACK SURGERY     CATARACT EXTRACTION W/ INTRAOCULAR LENS  IMPLANT, BILATERAL Bilateral    FIXATION KYPHOPLASTY LUMBAR SPINE  10/12/10; 09/20/11   FRACTURE SURGERY     IR KYPHO THORACIC WITH BONE BIOPSY  03/24/2018   IR VERTEBROPLASTY CERV/THOR BX INC UNI/BIL INC/INJECT/IMAGING  03/24/2018   LUMBAR SPINE SURGERY     "did OR on 2 fractures in my lower back"   LUNG BIOPSY     ORIF HIP FRACTURE Right 05/23/2015   Procedure: OPEN REDUCTION INTERNAL FIXATION HIP;  Surgeon: Frederik Pear, MD;  Location: [facility];  Service: Orthopedics;  Laterality: Right;   VARICOSE VEIN SURGERY     vein ligation and stripping "years ago"     OB History    Gravida  4  Para  4   Term  4   Preterm      AB      Living  4     SAB      TAB      Ectopic      Multiple      Live Births              Family History  Problem Relation Age of Onset   Colon cancer Mother 62   Congestive Heart Failure Mother    Heart disease Father    Alcohol abuse Father    Heart disease Maternal Grandmother    Fibromyalgia Son     Social History   Tobacco Use   Smoking status: Former Smoker    Years: 1.00   Smokeless tobacco: Never Used   Tobacco comment: At age 6 yrs old but nothing since   Vaping Use   Vaping Use: Never used  Substance Use Topics   Alcohol use: No   Drug use: No    Home Medications Prior to Admission medications   Medication Sig Start Date End Date Taking?  Authorizing Provider  acetaminophen (TYLENOL) 500 MG tablet Take 500-1,000 mg by mouth every 6 (six) hours as needed for moderate pain.   Yes [provider]  aspirin EC 81 MG tablet Take 81 mg by mouth daily. Swallow whole.   Yes [provider]  cetirizine (ZYRTEC) 10 MG tablet Take 10 mg by mouth daily as needed for allergies.    Yes [provider]  cholecalciferol (VITAMIN D) 1000 UNITS tablet Take 1,000 Units by mouth at bedtime.    Yes [provider]  dorzolamide (TRUSOPT) 2 % ophthalmic solution Place 1 drop into both eyes 2 (two) times daily.    Yes [provider]  fluticasone (FLONASE) 50 MCG/ACT nasal spray Place 1 spray into both nostrils daily as needed for allergies.    Yes [provider]  ibuprofen (ADVIL,MOTRIN) 200 MG tablet Take 400 mg by mouth every 6 (six) hours as needed for mild pain.    Yes [provider]  latanoprost (XALATAN) 0.005 % ophthalmic solution Place 1 drop into both eyes at bedtime.   Yes [provider]  Multiple Vitamin (MULTIVITAMIN) tablet Take 1 tablet by mouth at bedtime.    Yes [provider]  oxyCODONE-acetaminophen (PERCOCET) 5-325 MG tablet Take 1-2 tablets by mouth every 6 (six) hours as needed. Patient taking differently: Take 1 tablet by mouth every 6 (six) hours as needed for moderate pain or severe pain.  03/02/18  Yes Milton Ferguson, MD  Probiotic Product (ALIGN) 4 MG CAPS Take 4 mg by mouth at bedtime.    Yes [provider]  simethicone (MYLICON) 182 MG chewable tablet Chew 125 mg by mouth in the morning and at bedtime.   Yes [provider]  traMADol (ULTRAM) 50 MG tablet Take 25 mg by mouth 2 (two) times daily as needed for moderate pain.  10/05/19  Yes [provider]  traZODone (DESYREL) 50 MG tablet Take 0.5-1 tablets (25-50 mg total) by mouth at bedtime as needed for sleep. Patient taking differently: Take 50 mg by mouth at bedtime  as needed for sleep.  10/28/18  Yes [name], MD  citalopram (CELEXA) 40 MG tablet Take 40 mg by mouth daily. Patient not taking: Reported on 11/11/2019 09/12/16   [provider]  cyclobenzaprine (FLEXERIL) 10 MG tablet Take 10 mg by mouth 3 (three) times daily. Patient  not taking: Reported on 11/11/2019 11/05/19   [provider]  esomeprazole (NEXIUM) 40 MG capsule Take 1 capsule (40 mg total) by mouth daily. Patient not taking: Reported on 11/11/2019 10/28/18   [name], MD  ketoconazole (NIZORAL) 2 % shampoo Apply 1 application topically daily as needed for irritation.  Patient not taking: Reported on 11/11/2019 02/10/18   [provider]  lidocaine (LIDODERM) 5 % Place 1 patch onto the skin daily. Remove & Discard patch within 12 hours or as directed by MD Patient not taking: Reported on 11/11/2019 03/12/19   Blanchie Dessert, MD  LORazepam (ATIVAN) 1 MG tablet Take 1 mg by mouth 2 (two) times daily as needed for anxiety. Patient not taking: Reported on 11/11/2019    [provider]  pravastatin (PRAVACHOL) 10 MG tablet Take 10 mg by mouth daily. Patient not taking: Reported on 11/11/2019 10/02/19   [provider]    Allergies    Avelox [moxifloxacin hcl in nacl], Biaxin [clarithromycin], Cefuroxime, Levaquin [levofloxacin in d5w], Minocycline, Paxil [paroxetine hcl], Prednisone, Remeron [mirtazapine], Sulfur, Trimox [amoxicillin], and Viibryd [vilazodone hcl]  Review of Systems   Review of Systems  Constitutional: Positive for activity change.  Respiratory: Negative for shortness of breath.   Cardiovascular: Negative for chest pain.  Gastrointestinal: Negative for vomiting.  Musculoskeletal: Positive for arthralgias.  Allergic/Immunologic: Negative for immunocompromised state.  Hematological: Does not bruise/bleed easily.    Physical Exam Updated Vital Signs BP (!) 145/79 (BP Location: Right Arm)    Pulse 84    Temp 97.9 F (36.6 C) (Oral)     Resp (!) 21    SpO2 92%   Physical Exam Vitals and nursing note reviewed.  Constitutional:      Appearance: She is well-developed.  HENT:     Head: Normocephalic and atraumatic.  Eyes:     Pupils: Pupils are equal, round, and reactive to light.  Cardiovascular:     Rate and Rhythm: Normal rate and regular rhythm.     Heart sounds: Normal heart sounds. No murmur heard.   Pulmonary:     Effort: Pulmonary effort is normal. No respiratory distress.  Abdominal:     General: There is no distension.     Palpations: Abdomen is soft.     Tenderness: There is no abdominal tenderness. There is no guarding or rebound.  Musculoskeletal:     Cervical back: Neck supple.     Comments: Patient has tenderness over the right hip.  Skin:    General: Skin is warm and dry.  Neurological:     Mental Status: She is alert and oriented to person, place, and time.     ED Results / Procedures / Treatments   Labs (all labs ordered are listed, but only abnormal results are displayed) Labs Reviewed  BASIC METABOLIC PANEL - Abnormal; Notable for the following components:      Result Value   Glucose, Bld 132 (*)    BUN 25 (*)    Creatinine, Ser 1.10 (*)    GFR calc non Af Amer 43 (*)    GFR calc Af Amer 50 (*)    All other components within normal limits  CBC WITH DIFFERENTIAL/PLATELET - Abnormal; Notable for the following components:   RBC 3.23 (*)    Hemoglobin 9.0 (*)    HCT 28.6 (*)    Neutro Abs 9.1 (*)    Lymphs Abs 0.6 (*)    All other components within normal limits  SARS CORONAVIRUS 2  BY RT PCR (HOSPITAL ORDER, [facility])  PROTIME-INR  TYPE AND SCREEN  ABO/RH    EKG EKG Interpretation  Date/Time:  Sunday November 11 2019 13:41:09 EDT Ventricular Rate:  90 PR Interval:    QRS Duration: 84 QT Interval:  364 QTC Calculation: 446 R Axis:   -6 Text Interpretation: Sinus rhythm Paired ventricular premature complexes Aberrant conduction of SV complex(es) Low  voltage, precordial leads Probable anteroseptal infarct, old No acute changes Confirmed by ,  ([id number]) on 11/11/2019 4:51:41 PM   Radiology DG Chest Port 1 View  Result Date: 11/11/2019 CLINICAL DATA:  Right pelvic fractures, fall EXAM: PORTABLE CHEST 1 VIEW COMPARISON:  11/13/2018 FINDINGS: Stable mild cardiomegaly without CHF. No focal pneumonia. Lungs remain clear. No large effusion or pneumothorax. Trachea midline. Aorta atherosclerotic. Bones are osteopenic. Previous vertebral augmentation changes noted of the spine. IMPRESSION: Mild cardiomegaly without acute chest process.  Stable exam. Electronically Signed   By: M.  Shick M.D.   On: 11/11/2019 13:46   DG Hip Unilat With Pelvis 2-3 Views Right  Result Date: 11/11/2019 CLINICAL DATA:  86 year old female with hip pain on the right EXAM: DG HIP (WITH OR WITHOUT PELVIS) 2-3V RIGHT COMPARISON:  05/23/2015 FINDINGS: Osteopenia. Fracture of the right pelvis involving the acetabulum, with disruption of the iliopectineal line. Fracture of the inferior pubic ramus. Bilateral hips projects normally over the acetabula. Surgical changes of right hip fixation, with dynamic plate/screw fixation. Lucency present at the femoral head/neck screw IMPRESSION: Right pelvic fracture involving the acetabulum, as well as the right inferior pubic ramus. CT imaging is indicated. Surgical changes of prior right hip fixation, with some lucency at the femoral neck/head screw. Electronically Signed   By: Jaime  Wagner D.O.   On: 11/11/2019 13:46    Procedures Procedures (including critical care time)  Medications Ordered in ED Medications  fentaNYL (SUBLIMAZE) injection 25 mcg (has no administration in time range)    ED Course  I have reviewed the triage vital signs and the nursing notes.  Pertinent labs & imaging results that were available during my care of the patient were reviewed by me and considered in my medical decision making (see chart for  details).    MDM Rules/Calculators/A&P                          86  year old female comes in a chief complaint of fall. The fall occurred few days ago.  It appears that patient has been having falls for the last several months now, while staying at her home.  On the x-ray she is noted to have right-sided hip fracture.  There is hardware at that site. I discussed the case with Dr. Sharol Given, orthopedic surgery.  He has reviewed the x-ray and informed me that patient will be weightbearing as tolerated and that she has an injury that is likely going to heal without surgical intervention.  These findings were discussed with the patient and her son.  Patient wants to go home and the son wants her to come back home, however family has been taking care of a lot of her ADLs over the last few days and are overwhelmed at this time.  It appears to me that patient's pain is not also being controlled with home narcotic regimen that she is on.  We will admit her and she can get her pain in better control along with social worker/Pt consult who can help arrange home health.  Final Clinical Impression(s) / ED Diagnoses Final diagnoses:  Closed right hip fracture, initial encounter [facility])    Rx / [place] Orders ED Discharge Orders    None       Varney Biles, MD 11/11/19 1753

## 2019-11-11 NOTE — ED Notes (Signed)
XR at bedside

## 2019-11-11 NOTE — ED Notes (Signed)
ED Provider at bedside. 

## 2019-11-11 NOTE — ED Triage Notes (Addendum)
Per EMS, patient from home, reports fall on 6/28. Reports worsening pain to right leg and left shoulder. Ambulatory.   [name], patient's son, [phone]

## 2019-11-11 NOTE — ED Notes (Signed)
Attempted to call report without answer.

## 2019-11-11 NOTE — Plan of Care (Signed)
Plan of care discussed with pt.

## 2019-11-11 NOTE — Progress Notes (Signed)
Attempted to call Pt's son twice, call was not answered. Left voice message to call back.

## 2019-11-12 DIAGNOSIS — S3289XA Fracture of other parts of pelvis, initial encounter for closed fracture: Secondary | ICD-10-CM | POA: Diagnosis not present

## 2019-11-12 DIAGNOSIS — M353 Polymyalgia rheumatica: Secondary | ICD-10-CM | POA: Diagnosis not present

## 2019-11-12 DIAGNOSIS — S32434A Nondisplaced fracture of anterior column [iliopubic] of right acetabulum, initial encounter for closed fracture: Secondary | ICD-10-CM

## 2019-11-12 DIAGNOSIS — K219 Gastro-esophageal reflux disease without esophagitis: Secondary | ICD-10-CM | POA: Diagnosis not present

## 2019-11-12 DIAGNOSIS — R52 Pain, unspecified: Secondary | ICD-10-CM | POA: Diagnosis not present

## 2019-11-12 DIAGNOSIS — Z743 Need for continuous supervision: Secondary | ICD-10-CM | POA: Diagnosis not present

## 2019-11-12 DIAGNOSIS — R0902 Hypoxemia: Secondary | ICD-10-CM | POA: Diagnosis not present

## 2019-11-12 DIAGNOSIS — F4323 Adjustment disorder with mixed anxiety and depressed mood: Secondary | ICD-10-CM | POA: Diagnosis not present

## 2019-11-12 DIAGNOSIS — S32591A Other specified fracture of right pubis, initial encounter for closed fracture: Secondary | ICD-10-CM | POA: Diagnosis not present

## 2019-11-12 DIAGNOSIS — R279 Unspecified lack of coordination: Secondary | ICD-10-CM | POA: Diagnosis not present

## 2019-11-12 DIAGNOSIS — S32401A Unspecified fracture of right acetabulum, initial encounter for closed fracture: Secondary | ICD-10-CM | POA: Diagnosis not present

## 2019-11-12 DIAGNOSIS — Z20822 Contact with and (suspected) exposure to covid-19: Secondary | ICD-10-CM | POA: Diagnosis not present

## 2019-11-12 LAB — CBC WITH DIFFERENTIAL/PLATELET
Abs Immature Granulocytes: 0.05 10*3/uL (ref 0.00–0.07)
Basophils Absolute: 0 10*3/uL (ref 0.0–0.1)
Basophils Relative: 0 %
Eosinophils Absolute: 0 10*3/uL (ref 0.0–0.5)
Eosinophils Relative: 0 %
HCT: 28.7 % — ABNORMAL LOW (ref 36.0–46.0)
Hemoglobin: 9 g/dL — ABNORMAL LOW (ref 12.0–15.0)
Immature Granulocytes: 0 %
Lymphocytes Relative: 10 %
Lymphs Abs: 1.2 10*3/uL (ref 0.7–4.0)
MCH: 28 pg (ref 26.0–34.0)
MCHC: 31.4 g/dL (ref 30.0–36.0)
MCV: 89.1 fL (ref 80.0–100.0)
Monocytes Absolute: 0.8 10*3/uL (ref 0.1–1.0)
Monocytes Relative: 7 %
Neutro Abs: 9.9 10*3/uL — ABNORMAL HIGH (ref 1.7–7.7)
Neutrophils Relative %: 83 %
Platelets: 283 10*3/uL (ref 150–400)
RBC: 3.22 MIL/uL — ABNORMAL LOW (ref 3.87–5.11)
RDW: 15.4 % (ref 11.5–15.5)
WBC: 11.9 10*3/uL — ABNORMAL HIGH (ref 4.0–10.5)
nRBC: 0 % (ref 0.0–0.2)

## 2019-11-12 LAB — ABO/RH: ABO/RH(D): O POS

## 2019-11-12 LAB — COMPREHENSIVE METABOLIC PANEL
ALT: 17 U/L (ref 0–44)
AST: 24 U/L (ref 15–41)
Albumin: 3.3 g/dL — ABNORMAL LOW (ref 3.5–5.0)
Alkaline Phosphatase: 77 U/L (ref 38–126)
Anion gap: 8 (ref 5–15)
BUN: 23 mg/dL (ref 8–23)
CO2: 25 mmol/L (ref 22–32)
Calcium: 9 mg/dL (ref 8.9–10.3)
Chloride: 102 mmol/L (ref 98–111)
Creatinine, Ser: 0.94 mg/dL (ref 0.44–1.00)
GFR calc Af Amer: >60 mL/min (ref >60)
GFR calc non Af Amer: 52 mL/min — ABNORMAL LOW (ref >60)
Glucose, Bld: 87 mg/dL (ref 70–99)
Potassium: 3.8 mmol/L (ref 3.5–5.1)
Sodium: 135 mmol/L (ref 135–145)
Total Bilirubin: 0.8 mg/dL (ref 0.3–1.2)
Total Protein: 5.9 g/dL — ABNORMAL LOW (ref 6.5–8.1)

## 2019-11-12 LAB — PHOSPHORUS: Phosphorus: 2.7 mg/dL (ref 2.5–4.6)

## 2019-11-12 LAB — MAGNESIUM: Magnesium: 2.4 mg/dL (ref 1.7–2.4)

## 2019-11-12 MED ORDER — METHOCARBAMOL 500 MG PO TABS: 500.0000 mg | ORAL_TABLET | Freq: Four times a day (QID) | ORAL | 0 refills | Status: DC | PRN

## 2019-11-12 MED ORDER — POLYETHYLENE GLYCOL 3350 17 G PO PACK: 17.0000 g | PACK | Freq: Every day | ORAL | 0 refills | Status: DC | PRN

## 2019-11-12 MED ORDER — SENNOSIDES-DOCUSATE SODIUM 8.6-50 MG PO TABS: 1.0000 | ORAL_TABLET | Freq: Two times a day (BID) | ORAL | 0 refills | Status: DC

## 2019-11-12 MED ORDER — LORAZEPAM 1 MG PO TABS
1.0000 mg | ORAL_TABLET | Freq: Two times a day (BID) | ORAL | Status: DC | PRN
  Administered 2019-11-12: 1 mg via ORAL
  Filled 2019-11-12: qty 1

## 2019-11-12 NOTE — Progress Notes (Signed)
Nutrition Brief Note  Patient identified for consult per hip fracture protocol. MST score of 0.  Wt Readings from Last 15 Encounters:  11/11/19 46.1 kg  03/12/19 40.8 kg  03/24/18 43.1 kg  03/21/18 43.1 kg  03/02/18 42.6 kg  02/13/18 42.3 kg  11/25/16 45.4 kg  07/23/16 43.7 kg  09/15/15 44.9 kg  05/28/15 45 kg  08/14/13 44 kg  10/05/12 42.6 kg  08/03/12 43.5 kg  07/31/12 43.5 kg  05/30/12 45.6 kg    Body mass index is 21.99 kg/m. Patient meets criteria for normal weight based on current BMI.  She was admitted for R pelvic fx which was determined to be inoperable.    Current diet order is Heart Healthy and she ate 100% of breakfast this AM which consisted of coffee with creamer and sweet & low, orange juice, and yogurt (188 kcal, 15 grams protein). Labs and medications reviewed.   Discharge order was entered at 1108 for discharge home; no discharge summary entered yet.   No nutrition interventions warranted at this time. If nutrition issues arise, please consult RD.     Jarome Matin, MS, RD, LDN, CNSC Inpatient Clinical Dietitian RD pager # [phone] in [place]  After hours/weekend pager # [phone] in [facility]

## 2019-11-12 NOTE — Consult Note (Signed)
ORTHOPAEDIC CONSULTATION  REQUESTING PHYSICIAN: Kerney Elbe, DO  Chief Complaint: Right hip pain.  HPI: Katrina Burnett is a 86 y.o. female who presents with a fall on the right hip.  Patient states she fell several days ago has been having persistent pain since that time.  Patient is status post DHS screw for an intertrochanteric hip fracture about 3 years ago.  Surgery performed by Dr. Mayer Camel  Past Medical History:  Diagnosis Date  . Allergic rhinitis   . Anxiety   . Arthritis    "hands" (11/25/2016)  . Barrett's esophagus 06/1997  . CAP (community acquired pneumonia) 11/25/2016   "this is my 3rd time having pneumonia" (11/25/2016)  . Chondromalacia   . Chronic lower back pain   . Depression   . Diverticulosis   . GERD (gastroesophageal reflux disease)   . Glaucoma   . History of hiatal hernia   . History of stomach ulcers   . Lumbar compression fracture (HCC)    L1 and L2  . Osteoporosis   . Pneumonia   . Polymyalgia ([place])   . Tubular adenoma of colon 06/1999  . Vaginitis   . Varicose veins    Past Surgical History:  Procedure Laterality Date  . BACK SURGERY    . CATARACT EXTRACTION W/ INTRAOCULAR LENS  IMPLANT, BILATERAL Bilateral   . FIXATION KYPHOPLASTY LUMBAR SPINE  10/12/10; 09/20/11  . FRACTURE SURGERY    . IR KYPHO THORACIC WITH BONE BIOPSY  03/24/2018  . IR VERTEBROPLASTY CERV/THOR BX INC UNI/BIL INC/INJECT/IMAGING  03/24/2018  . LUMBAR SPINE SURGERY     "did OR on 2 fractures in my lower back"  . LUNG BIOPSY    . ORIF HIP FRACTURE Right 05/23/2015   Procedure: OPEN REDUCTION INTERNAL FIXATION HIP;  Surgeon: Frederik Pear, MD;  Location: [facility];  Service: Orthopedics;  Laterality: Right;  Marland Kitchen VARICOSE VEIN SURGERY     vein ligation and stripping "years ago"   Social History   Socioeconomic History  . Marital status: Widowed    Spouse name: Not on file  . Number of children: 4  . Years of education: Not on file  . Highest education level: Not on file    Occupational History  . Occupation: Retired  Tobacco Use  . Smoking status: Former Smoker    Years: 1.00  . Smokeless tobacco: Never Used  . Tobacco comment: At age 55 yrs old but nothing since   Vaping Use  . Vaping Use: Never used  Substance and Sexual Activity  . Alcohol use: No  . Drug use: No  . Sexual activity: Never    Birth control/protection: Post-menopausal  Other Topics Concern  . Not on file  Social History Narrative   Daily caffeine    Social Determinants of Health   Financial Resource Strain:   . Difficulty of Paying Living Expenses:   Food Insecurity:   . Worried About Charity fundraiser in the Last Year:   . Arboriculturist in the Last Year:   Transportation Needs:   . Film/video editor (Medical):   Marland Kitchen Lack of Transportation (Non-Medical):   Physical Activity:   . Days of Exercise per Week:   . Minutes of Exercise per Session:   Stress:   . Feeling of Stress :   Social Connections:   . Frequency of Communication with Friends and Family:   . Frequency of Social Gatherings with Friends and Family:   . Attends Religious Services:   .  Active Member of Clubs or Organizations:   . Attends Archivist Meetings:   Marland Kitchen Marital Status:    Family History  Problem Relation Age of Onset  . Colon cancer Mother 66  . Congestive Heart Failure Mother   . Heart disease Father   . Alcohol abuse Father   . Heart disease Maternal Grandmother   . Fibromyalgia Son    - negative except otherwise stated in the family history section Allergies  Allergen Reactions  . Avelox [Moxifloxacin Hcl In Nacl] Other (See Comments)    Felt bad  . Biaxin [Clarithromycin] Nausea Only  . Cefuroxime Diarrhea  . Levaquin [Levofloxacin In D5w] Diarrhea  . Minocycline Nausea And Vomiting  . Paxil [Paroxetine Hcl] Nausea And Vomiting  . Prednisone Nausea Only  . Remeron [Mirtazapine]     Doesn't remember   . Sulfur Nausea And Vomiting  . Trimox [Amoxicillin] Other  (See Comments)    Doesn't remember  Has patient had a PCN reaction causing immediate rash, facial/tongue/throat swelling, SOB or lightheadedness with hypotension: Unknown Has patient had a PCN reaction causing severe rash involving mucus membranes or skin necrosis: Unknown Has patient had a PCN reaction that required hospitalization: Unknown Has patient had a PCN reaction occurring within the last 10 years: Unknown If all of the above answers are "NO", then may proceed with Cephalosporin use.  Arman Filter Hcl] Other (See Comments)    Doesn't remember    Prior to Admission medications   Medication Sig Start Date End Date Taking? Authorizing Provider  acetaminophen (TYLENOL) 500 MG tablet Take 500-1,000 mg by mouth every 6 (six) hours as needed for moderate pain.   Yes [provider]  aspirin EC 81 MG tablet Take 81 mg by mouth daily. Swallow whole.   Yes [provider]  cetirizine (ZYRTEC) 10 MG tablet Take 10 mg by mouth daily as needed for allergies.    Yes [provider]  cholecalciferol (VITAMIN D) 1000 UNITS tablet Take 1,000 Units by mouth at bedtime.    Yes [provider]  dorzolamide (TRUSOPT) 2 % ophthalmic solution Place 1 drop into both eyes 2 (two) times daily.    Yes [provider]  fluticasone (FLONASE) 50 MCG/ACT nasal spray Place 1 spray into both nostrils daily as needed for allergies.    Yes [provider]  ibuprofen (ADVIL,MOTRIN) 200 MG tablet Take 400 mg by mouth every 6 (six) hours as needed for mild pain.    Yes [provider]  latanoprost (XALATAN) 0.005 % ophthalmic solution Place 1 drop into both eyes at bedtime.   Yes [provider]  Multiple Vitamin (MULTIVITAMIN) tablet Take 1 tablet by mouth at bedtime.    Yes [provider]  oxyCODONE-acetaminophen (PERCOCET) 5-325 MG tablet Take 1-2 tablets by mouth every 6 (six) hours as needed. Patient taking differently: Take 1  tablet by mouth every 6 (six) hours as needed for moderate pain or severe pain.  03/02/18  Yes Milton Ferguson, MD  Probiotic Product (ALIGN) 4 MG CAPS Take 4 mg by mouth at bedtime.    Yes [provider]  simethicone (MYLICON) 384 MG chewable tablet Chew 125 mg by mouth in the morning and at bedtime.   Yes [provider]  traMADol (ULTRAM) 50 MG tablet Take 25 mg by mouth 2 (two) times daily as needed for moderate pain.  10/05/19  Yes [provider]  traZODone (DESYREL) 50 MG tablet Take 0.5-1 tablets (25-50 mg  total) by mouth at bedtime as needed for sleep. Patient taking differently: Take 50 mg by mouth at bedtime as needed for sleep.  10/28/18  Yes [name], MD  citalopram (CELEXA) 40 MG tablet Take 40 mg by mouth daily. Patient not taking: Reported on 11/11/2019 09/12/16   [provider]  cyclobenzaprine (FLEXERIL) 10 MG tablet Take 10 mg by mouth 3 (three) times daily. Patient not taking: Reported on 11/11/2019 11/05/19   [provider]  esomeprazole (NEXIUM) 40 MG capsule Take 1 capsule (40 mg total) by mouth daily. Patient not taking: Reported on 11/11/2019 10/28/18   [name], MD  ketoconazole (NIZORAL) 2 % shampoo Apply 1 application topically daily as needed for irritation.  Patient not taking: Reported on 11/11/2019 02/10/18   [provider]  lidocaine (LIDODERM) 5 % Place 1 patch onto the skin daily. Remove & Discard patch within 12 hours or as directed by MD Patient not taking: Reported on 11/11/2019 03/12/19   Blanchie Dessert, MD  LORazepam (ATIVAN) 1 MG tablet Take 1 mg by mouth 2 (two) times daily as needed for anxiety. Patient not taking: Reported on 11/11/2019    [provider]  pravastatin (PRAVACHOL) 10 MG tablet Take 10 mg by mouth daily. Patient not taking: Reported on 11/11/2019 10/02/19   [provider]   DG Chest Port 1 View  Result Date: 11/11/2019 CLINICAL DATA:  Right pelvic fractures, fall EXAM:  PORTABLE CHEST 1 VIEW COMPARISON:  11/13/2018 FINDINGS: Stable mild cardiomegaly without CHF. No focal pneumonia. Lungs remain clear. No large effusion or pneumothorax. Trachea midline. Aorta atherosclerotic. Bones are osteopenic. Previous vertebral augmentation changes noted of the spine. IMPRESSION: Mild cardiomegaly without acute chest process.  Stable exam. Electronically Signed   By: Jerilynn Mages.  Shick M.D.   On: 11/11/2019 13:46   DG Hip Unilat With Pelvis 2-3 Views Right  Result Date: 11/11/2019 CLINICAL DATA:  86 year old female with hip pain on the right EXAM: DG HIP (WITH OR WITHOUT PELVIS) 2-3V RIGHT COMPARISON:  05/23/2015 FINDINGS: Osteopenia. Fracture of the right pelvis involving the acetabulum, with disruption of the iliopectineal line. Fracture of the inferior pubic ramus. Bilateral hips projects normally over the acetabula. Surgical changes of right hip fixation, with dynamic plate/screw fixation. Lucency present at the femoral head/neck screw IMPRESSION: Right pelvic fracture involving the acetabulum, as well as the right inferior pubic ramus. CT imaging is indicated. Surgical changes of prior right hip fixation, with some lucency at the femoral neck/head screw. Electronically Signed   By: Corrie Mckusick D.O.   On: 11/11/2019 13:46   - pertinent xrays, CT, MRI studies were reviewed and independently interpreted  Positive ROS: All other systems have been reviewed and were otherwise negative with the exception of those mentioned in the HPI and as above.  Physical Exam: General: Alert, no acute distress Psychiatric: Patient is competent for consent with normal mood and affect Lymphatic: No axillary or cervical lymphadenopathy Cardiovascular: No pedal edema Respiratory: No cyanosis, no use of accessory musculature GI: No organomegaly, abdomen is soft and non-tender    Images:  @ENCIMAGES @  Labs:  Lab Results  Component Value Date   ESRSEDRATE 43 (H) 06/03/2009   REPTSTATUS 11/28/2016  FINAL 11/25/2016   GRAMSTAIN  05/26/2009    FEW WBC PRESENT, PREDOMINANTLY PMN NO SQUAMOUS EPITHELIAL CELLS SEEN NO ORGANISMS SEEN   CULT (A) 11/25/2016    STAPHYLOCOCCUS SPECIES (COAGULASE NEGATIVE) THE SIGNIFICANCE OF ISOLATING THIS ORGANISM FROM A SINGLE SET OF BLOOD CULTURES WHEN MULTIPLE  SETS ARE DRAWN IS UNCERTAIN. PLEASE NOTIFY THE MICROBIOLOGY DEPARTMENT WITHIN ONE WEEK IF SPECIATION AND SENSITIVITIES ARE REQUIRED.    LABORGA PSEUDOMONAS AERUGINOSA 05/24/2009    Lab Results  Component Value Date   ALBUMIN 3.3 (L) 11/12/2019   ALBUMIN 2.5 (L) 05/27/2015   ALBUMIN 3.4 (L) 05/30/2012    Neurologic: Patient does not have protective sensation bilateral lower extremities.   MUSCULOSKELETAL:   Skin: Examination patient skin is intact no open wounds.  Patient has no pain with passive range of motion of the right hip.  Review the radiographs shows a nondisplaced acetabular and rami fracture on the right.  Assessment: Assessment: Nondisplaced right acetabular and pubic rami fracture  Plan: Plan: Physical therapy progressive ambulation weightbearing as tolerated.  Patient most likely will need a walker.  I will follow-up in the office in 2 weeks to repeat radiographs.  Thank you for the consult and the opportunity to see Ms. Irine Seal, MD [facility] [phone] 8:37 AM

## 2019-11-12 NOTE — Evaluation (Signed)
Occupational Therapy Evaluation Patient Details Name: Katrina Burnett MRN: [id number] DOB: 22-Mar-1925 Today's Date: 11/12/2019    History of Present Illness 86 y.o. female who presents with a fall on the right hip.  Patient states she fell several days prior to admission and  has been having persistent pain since that time.medical history significant of anxiety and depression, arthritis, history of Barrett's esophagus, GERD, hiatal hernia, chronic lower back pain, history of lumbar compression fracture L1-L2, osteoporosis, diverticulosis   Clinical Impression   OT eval and education complete.  Pt very motivated to get well !    Follow Up Recommendations  Home health OT;Supervision/Assistance - 24 hour    Equipment Recommendations  None recommended by OT    Recommendations for Other Services       Precautions / Restrictions Precautions Precautions: Fall Restrictions Weight Bearing Restrictions: No Other Position/Activity Restrictions: WBAT per ortho MD      Mobility Bed Mobility Overal bed mobility: Needs Assistance Bed Mobility: Supine to Sit     Supine to sit: Min assist     General bed mobility comments: pt in chair per OT assist  Transfers Overall transfer level: Needs assistance Equipment used: Rolling walker (2 wheeled) Transfers: Sit to/from [facility] Sit to Stand: Min guard Stand pivot transfers: Min guard       General transfer comment: cues for hand placement    Balance Overall balance assessment: Needs assistance;History of Falls Sitting-balance support: No upper extremity supported;Feet supported Sitting balance-Leahy Scale: Fair     Standing balance support: Bilateral upper extremity supported Standing balance-Leahy Scale: Poor Standing balance comment: reliant on UEs                           ADL either performed or assessed with clinical judgement   ADL Overall ADL's : Needs assistance/impaired Eating/Feeding: Set  up;Sitting   Grooming: Set up;Sitting   Upper Body Bathing: Set up;Sitting   Lower Body Bathing: Minimal assistance;Sit to/from stand;Cueing for safety;Cueing for compensatory techniques   Upper Body Dressing : Set up;Sitting   Lower Body Dressing: Minimal assistance;Sit to/from stand;Cueing for safety;Cueing for sequencing   Toilet Transfer: Minimal assistance;RW   Toileting- Clothing Manipulation and Hygiene: Minimal assistance;Sit to/from stand       Functional mobility during ADLs: Minimal assistance;Rolling walker       Vision Patient Visual Report: No change from baseline       Perception     Praxis      Pertinent Vitals/Pain Pain Assessment: 0-10 Pain Score: 5  Pain Location: pelvic region Pain Descriptors / Indicators: Sore Pain Intervention(s): Limited activity within patient's tolerance;Monitored during session;Premedicated before session;Repositioned     Hand Dominance     Extremity/Trunk Assessment Upper Extremity Assessment Upper Extremity Assessment: Generalized weakness   Lower Extremity Assessment Lower Extremity Assessment: Generalized weakness       Communication Communication Communication: No difficulties   Cognition Arousal/Alertness: Awake/alert Behavior During Therapy: WFL for tasks assessed/performed Overall Cognitive Status: Within Functional Limits for tasks assessed Area of Impairment: Memory                     Memory: Decreased short-term memory         General Comments: grossly appropiate however diminished insight at times. pt states she has had no pain meds since she has been here (had oxy at 700); ? intermittent confusion, RN reported as moments of confusion well  Home Living Family/patient expects to be discharged to:: Private residence Living Arrangements: Children (son) Available Help at Discharge: Family Type of Home: House Home Access: Stairs to enter Technical brewer of Steps:  4 Entrance Stairs-Rails: Can reach both;Left;Right Home Layout: One level         Biochemist, clinical: Standard Bathroom Accessibility: Yes   Home Equipment: Environmental consultant - 2 wheels;Grab bars - tub/shower          Prior Functioning/Environment Level of Independence: Independent                 OT Problem List: Impaired balance (sitting and/or standing)         OT Goals(Current goals can be found in the care plan section) Acute Rehab OT Goals Patient Stated Goal: home soon OT Goal Formulation: With patient  OT Frequency:      AM-PAC OT "6 Clicks" Daily Activity     Outcome Measure Help from another person eating meals?: None Help from another person taking care of personal grooming?: None Help from another person toileting, which includes using toliet, bedpan, or urinal?: A Little Help from another person bathing (including washing, rinsing, drying)?: A Little Help from another person to put on and taking off regular upper body clothing?: None Help from another person to put on and taking off regular lower body clothing?: A Little 6 Click Score: 21   End of Session Equipment Utilized During Treatment: Rolling walker Nurse Communication: Mobility status  Activity Tolerance: Patient tolerated treatment well Patient left: in chair;with call bell/phone within reach;with family/visitor present  OT Visit Diagnosis: Muscle weakness (generalized) (M62.81);History of falling (Z91.81)                Time: 1000-1019 OT Time Calculation (min): 19 min Charges:  OT General Charges $OT Visit: 1 Visit OT Evaluation $OT Eval Low Complexity: 1 Low  Katrina Burnett, OT Acute Rehabilitation Services [phone] Office- [phone]     Katrina Burnett, Edwena Felty D 11/12/2019, 12:59 PM

## 2019-11-12 NOTE — Evaluation (Signed)
Physical Therapy Evaluation Patient Details Name: Katrina Burnett MRN: [id number] DOB: [birth year] Today's Date: 11/12/2019   History of Present Illness  86 y.o. female who presents with a fall on the right hip.  Patient states she fell several days prior to admission and  has been having persistent pain since that time.medical history significant of anxiety and depression, arthritis, history of Barrett's esophagus, GERD, hiatal hernia, chronic lower back pain, history of lumbar compression fracture L1-L2, osteoporosis, diverticulosis  Clinical Impression  Pt admitted with above diagnosis.  PT amb hallway distance with min assist to min/guard (total distance ~ 270' with RW). Will benefit from HHPT at d/c and family assist. Pt has 4 steps with bil rails, feel pt can amb up steps with family assist as well as she is mobilizing this am.   Pt currently with functional limitations due to the deficits listed below (see PT Problem List). Pt will benefit from skilled PT to increase their independence and safety with mobility to allow discharge to the venue listed below.       Follow Up Recommendations Home health PT;Supervision for mobility/OOB    Equipment Recommendations  None recommended by PT    Recommendations for Other Services       Precautions / Restrictions Precautions Precautions: Fall Restrictions Weight Bearing Restrictions: No Other Position/Activity Restrictions: WBAT per ortho MD      Mobility  Bed Mobility               General bed mobility comments: pt in chair per OT assist  Transfers Overall transfer level: Needs assistance Equipment used: Rolling walker (2 wheeled) Transfers: Sit to/from Stand Sit to Stand: Min guard         General transfer comment: cues for hand placement  Ambulation/Gait Ambulation/Gait assistance: Min guard;Min assist Gait Distance (Feet): 190 Feet (80' more)   Gait Pattern/deviations: Step-through pattern;Decreased stride  length;Drifts right/left     General Gait Details: occasional assist to maneuver RW and avoid obstacles on L, cues for Rw position and safety  Stairs            Wheelchair Mobility    Modified Rankin (Stroke Patients Only)       Balance Overall balance assessment: Needs assistance;History of Falls Sitting-balance support: No upper extremity supported;Feet supported Sitting balance-Leahy Scale: Fair     Standing balance support: Bilateral upper extremity supported Standing balance-Leahy Scale: Poor Standing balance comment: reliant on UEs                             Pertinent Vitals/Pain Pain Assessment: 0-10 Pain Score: 5  Pain Location: pelvic region Pain Descriptors / Indicators: Sore Pain Intervention(s): Limited activity within patient's tolerance;Monitored during session;Premedicated before session;Repositioned    Home Living Family/patient expects to be discharged to:: Private residence Living Arrangements: Children (son) Available Help at Discharge: Family Type of Home: House Home Access: Stairs to enter Entrance Stairs-Rails: Can reach both;Left;Right Entrance Stairs-Number of Steps: 4 Home Layout: One level Home Equipment: Environmental consultant - 2 wheels;Grab bars - tub/shower      Prior Function Level of Independence: Independent               Hand Dominance        Extremity/Trunk Assessment   Upper Extremity Assessment Upper Extremity Assessment: Defer to OT evaluation    Lower Extremity Assessment Lower Extremity Assessment: Generalized weakness       Communication   Communication: No  difficulties  Cognition Arousal/Alertness: Awake/alert Behavior During Therapy: WFL for tasks assessed/performed Overall Cognitive Status: Impaired/Different from baseline Area of Impairment: Memory                     Memory: Decreased short-term memory         General Comments: grossly appropiate however diminished insight at  times. pt states she has had no pain meds since she has been here (had oxy at 700); ? intermittent confusion, RN reported as moments of confusion well      General Comments      Exercises     Assessment/Plan    PT Assessment Patient needs continued PT services  PT Problem List Decreased strength;Decreased mobility;Decreased activity tolerance;Decreased balance;Decreased knowledge of use of DME;Pain;Decreased cognition;Decreased knowledge of precautions       PT Treatment Interventions DME instruction;Therapeutic exercise;Gait training;Functional mobility training;Therapeutic activities;Patient/family education;Stair training;Balance training    PT Goals (Current goals can be found in the Care Plan section)  Acute Rehab PT Goals Patient Stated Goal: home soon PT Goal Formulation: With patient Time For Goal Achievement: 11/19/19 Potential to Achieve Goals: Good    Frequency Min 3X/week   Barriers to discharge        Co-evaluation               AM-PAC PT "6 Clicks" Mobility  Outcome Measure Help needed turning from your back to your side while in a flat bed without using bedrails?: A Little Help needed moving from lying on your back to sitting on the side of a flat bed without using bedrails?: A Little Help needed moving to and from a bed to a chair (including a wheelchair)?: A Little Help needed standing up from a chair using your arms (e.g., wheelchair or bedside chair)?: A Little Help needed to walk in hospital room?: A Little Help needed climbing 3-5 steps with a railing? : A Little 6 Click Score: 18    End of Session Equipment Utilized During Treatment: Gait belt Activity Tolerance: Patient tolerated treatment well;Patient limited by pain Patient left: in chair;with call bell/phone within reach   PT Visit Diagnosis: Difficulty in walking, not elsewhere classified (R26.2);History of falling (Z91.81);Unsteadiness on feet (R26.81)    Time: 6579-0383 PT Time  Calculation (min) (ACUTE ONLY): 21 min   Charges:   PT Evaluation $PT Eval Low Complexity: [place], PT  Acute Rehab Dept ([place]) [phone] Pager [phone]  11/12/2019   [facility] 11/12/2019, 11:36 AM

## 2019-11-12 NOTE — Discharge Summary (Signed)
Physician Discharge Summary  DALYAH PLA ZYS:[id number] DOB: [birth year] DOA: 11/11/2019  PCP: Jani Gravel, MD  Admit date: 11/11/2019 Discharge date: 11/12/2019  Admitted From: Home Disposition: Home with Home Health  Recommendations for Outpatient Follow-up:  1. Follow up with PCP in 1-2 weeks 2. Follow up with Orthopedic Surgery within 1-2 weeks and repeat Hip X-Rays 3. Please obtain CMP/CBC, Mag, Phos in one week 4. Please follow up on the following pending results:  Home Health: No Equipment/Devices: None   Discharge Condition: Stable CODE STATUS: DO NOT RESUSCITATE  Diet recommendation:   Brief/Interim Summary: Katrina Burnett is a 86 y.o. female with medical history significant of anxiety and depression, arthritis, history of Barrett's esophagus, GERD, hiatal hernia, chronic lower back pain, history of lumbar compression fracture L1-L2, osteoporosis, diverticulosis, as well as other comorbidities who presents to the hospital for evaluation for right-sided hip pain.  Patient had a mechanical fall 3 days ago and since then she has had significantly worsening pain.  She continues to ambulate but pain has worsened.  She denies any nausea or vomiting but states that she has not had a bowel movement about 5 days and has been taking Dulcolax pills.  The patient states that the pain is a 0 out of 10 currently after she received pain medication.  She denies any chest pain, shortness breath, lightheadedness or dizziness and states that she did not hit her head when she fell.  At home she has been getting around but has been a little bit more difficult to move and has some discomfort with walking but still able to walk.  She states that she still likes to do outdoor activities and does gardening and goes for walks and used to ride her bike. [facility] was was called to admit this patient for Her Right Hip Pain due to Pelvic Fracture and Ortho has been consulted for further evaluation and recommendations.  ED  Course: In the ED she had basic blood work done along with hip x-ray and a Covid test which was negative.  Ortho was consulted and Dr. Sharol Given recommended weightbearing as tolerated deformity see the patient in the morning  **Patient's pain was stable and she ambulated with physical therapy and they recommended home health PT OT.  Orthopedic surgery evaluated and felt that she is stable and recommended nonoperative management and follow-up with them in 1 to 2 weeks for repeat x-rays.  They recommended weightbearing as tolerated.  Patient states that she was sore but she was able to ambulate with her walker.  Her AKI improved after fluid hydration and she is stable to be discharged home at this time with family and will need to follow-up with PCP, as well as orthopedic surgery in 1 to 2 weeks and she understands agrees with plan of care.  Discharge Diagnoses:  Active Problems:   Lumbar compression fracture (HCC)   Esophageal reflux   Adjustment disorder with mixed anxiety and depressed mood   Fall   Pelvic fracture (HCC)  Right Hip Pain in the setting of Fall, stable and improved Right Pelvic Fracture including the acetabulum as well as the Inferior Pubic Ramus -Place in observation MedSurg  -She is hemodynamically stable at this time  -Hip x-ray showed a right pelvic fracture involving the acetabulum as well as right inferior pubic ramus; CT imaging was indicated but I discussed the case with Dr. Sharol Given who feels that we do not need to follow-up with CT imaging as this is likely nonoperative next-he  recommends weightbearing as tolerated and pain control along with PT and OT but he states that he formally see the patient in the a.m. for further direction -PT OT to further evaluate and treat; transition of care consulted  and they are recommending home health PT and OT with no equipment and she is stable to be discharged today -Weightbearing as tolerated per Ortho recommendations -Continue with pain  control with acetaminophen, oxycodone-acetaminophen, as well as and IV morphine 0.5 mg every 4 hours as needed for severe pain if p.o. ineffective -Hip fracture order set utilized -Continue wit DVT prophylaxis with Geralynn Ochs apparent 30 mg subcu nightly CDs -Continue with vitamin D and check vitamin D level in a.m. -Bowel regimen as below for constipation -Further Ortho evaluation; likely this is nonoperative and she will be discharged home today with outpatient orthopedic follow-up in 1 to 2 weeks with repeat imaging at that time  AKI on CKD stage IIIa -Upon further review of her creatinine it ranges anywhere from 0.7-0.9 -Last year her creatinine was 16/0.73 and today it is now 625/1.10 on admission; today it is improved after IV fluid hydration and it is 23/0.94 -We will give very gentle IV fluid hydration 50 MLS per hour for 12 hours. -Avoid nephrotoxic medications, contrast dyes, hypotension and renally dose medications -Repeat CMP in a.m. and continue to monitor renal function carefully  Hyperglycemia -Patient's glucose is 132 on admission and today it was 87 on CMP -Continue monitor blood sugars carefully and will not check a hemoglobin A1c -If necessary will place on sensitive NovoLog sign scale AC  Normocytic Anemia -Patient's Hemoglobin/Hematocrit is 9.0/20.6 on admission; it dropped from earlier last month when it was 10.3/33.0 -We will hold her ibuprofen for now -Check anemia panel in the outpatient setting -Repeat hemoglobin/hematocrit today is 9.0/20.7. -Continue to monitor for signs and symptoms of bleeding; currently no overt bleeding noted -Repeat CBC in a.m.  Constipation, improved -Likely in the setting of opioids  -She tried to Dulcolax pills without relief next-we will add senna docusate 1 tab p.o. twice daily and give her as a rectal suppository bisacodyl 10 mg once now -We will also add MiraLAX as needed at discharge  Depression and Anxiety -She is no longer  taking citalopram 40 mg p.o. daily and is no longer taking lorazepam 1 mg p.o. twice daily however she did states that she takes it at home  Glaucoma -Continue with dorzolamide 2% ophthalmic solution 1 drop in both eyes twice daily as well as latanoprost 0.005% ophthalmic solution 1 drop in both eyes nightly  GERD/Barrett's esophagus/history of hiatal hernia/history of stomach ulcers -If needed we will order her a PPI but she is no longer taking her esomeprazole 40 mg p.o. daily -She takes simethicone 125 mg chewable tablet in the morning and nightly which we have held -If needed  Chronic low back pain, history of lumbar compression fracture at L1-L2/arthritis and polymyalgia -She is no longer taking cyclobenzaprine 10 mg p.o. 3 times daily; will add Robaxin 500 mg p.o./IV every 6 as needed -Continue with acetaminophen [phone] mg every 6 hours as needed for moderate pain as well as oxycodone-acetaminophen 1-2 tabs every 6 hours as needed for moderate to severe pain -We will hold her ibuprofen 400 mg every 6; she is no longer taking tramadol 25 mg p.o. twice daily as needed  Leukocytosis -Likely Reactive to Pain -WBC went from 10.2 and is now 11.9 -No current signs and symptoms of infection- continue monitor trend in the outpatient  setting  Discharge Instructions Discharge Instructions    Call MD for:  difficulty breathing, headache or visual disturbances   Complete by: As directed    Call MD for:  extreme fatigue   Complete by: As directed    Call MD for:  hives   Complete by: As directed    Call MD for:  persistant dizziness or light-headedness   Complete by: As directed    Call MD for:  persistant nausea and vomiting   Complete by: As directed    Call MD for:  redness, tenderness, or signs of infection (pain, swelling, redness, odor or green/yellow discharge around incision site)   Complete by: As directed    Call MD for:  severe uncontrolled pain   Complete by: As  directed    Call MD for:  temperature >100.4   Complete by: As directed    Diet - low sodium heart healthy   Complete by: As directed    Discharge instructions   Complete by: As directed    You were cared for by a hospitalist during your hospital stay. If you have any questions about your discharge medications or the care you received while you were in the hospital after you are discharged, you can call the unit and ask to speak with the hospitalist on call if the hospitalist that took care of you is not available. Once you are discharged, your primary care physician will handle any further medical issues. Please note that NO REFILLS for any discharge medications will be authorized once you are discharged, as it is imperative that you return to your primary care physician (or establish a relationship with a primary care physician if you do not have one) for your aftercare needs so that they can reassess your need for medications and monitor your lab values.  Follow up with PCP and Orthopedic Surgery within 1-2 weeks and have repeat Hip X-Rays. Take all medications as prescribed. If symptoms change or worsen please return to the ED for evaluation   Increase activity slowly   Complete by: As directed      Allergies as of 11/12/2019      Reactions   Avelox [moxifloxacin Hcl In Nacl] Other (See Comments)   Felt bad   Biaxin [clarithromycin] Nausea Only   Cefuroxime Diarrhea   Levaquin [levofloxacin In D5w] Diarrhea   Minocycline Nausea And Vomiting   Paxil [paroxetine Hcl] Nausea And Vomiting   Prednisone Nausea Only   Remeron [mirtazapine]    Doesn't remember    Sulfur Nausea And Vomiting   Trimox [amoxicillin] Other (See Comments)   Doesn't remember  Has patient had a PCN reaction causing immediate rash, facial/tongue/throat swelling, SOB or lightheadedness with hypotension: Unknown Has patient had a PCN reaction causing severe rash involving mucus membranes or skin necrosis: Unknown Has  patient had a PCN reaction that required hospitalization: Unknown Has patient had a PCN reaction occurring within the last 10 years: Unknown If all of the above answers are "NO", then may proceed with Cephalosporin use.   Viibryd [vilazodone Hcl] Other (See Comments)   Doesn't remember       Medication List    STOP taking these medications   citalopram 40 MG tablet Commonly known as: CELEXA   cyclobenzaprine 10 MG tablet Commonly known as: FLEXERIL   esomeprazole 40 MG capsule Commonly known as: NEXIUM   ibuprofen 200 MG tablet Commonly known as: ADVIL   ketoconazole 2 % shampoo Commonly known as: NIZORAL  lidocaine 5 % Commonly known as: Lidoderm   pravastatin 10 MG tablet Commonly known as: PRAVACHOL   traMADol 50 MG tablet Commonly known as: ULTRAM     TAKE these medications   acetaminophen 500 MG tablet Commonly known as: TYLENOL Take 500-1,000 mg by mouth every 6 (six) hours as needed for moderate pain.   Align 4 MG Caps Take 4 mg by mouth at bedtime.   aspirin EC 81 MG tablet Take 81 mg by mouth daily. Swallow whole.   cetirizine 10 MG tablet Commonly known as: ZYRTEC Take 10 mg by mouth daily as needed for allergies.   cholecalciferol 1000 units tablet Commonly known as: VITAMIN D Take 1,000 Units by mouth at bedtime.   dorzolamide 2 % ophthalmic solution Commonly known as: TRUSOPT Place 1 drop into both eyes 2 (two) times daily.   fluticasone 50 MCG/ACT nasal spray Commonly known as: FLONASE Place 1 spray into both nostrils daily as needed for allergies.   latanoprost 0.005 % ophthalmic solution Commonly known as: XALATAN Place 1 drop into both eyes at bedtime.   LORazepam 1 MG tablet Commonly known as: ATIVAN Take 1 mg by mouth 2 (two) times daily as needed for anxiety.   methocarbamol 500 MG tablet Commonly known as: ROBAXIN Take 1 tablet (500 mg total) by mouth every 6 (six) hours as needed for muscle spasms.   multivitamin  tablet Take 1 tablet by mouth at bedtime.   oxyCODONE-acetaminophen 5-325 MG tablet Commonly known as: Percocet Take 1-2 tablets by mouth every 6 (six) hours as needed. What changed:   how much to take  reasons to take this   polyethylene glycol 17 g packet Commonly known as: MIRALAX / GLYCOLAX Take 17 g by mouth daily as needed for mild constipation.   senna-docusate 8.6-50 MG tablet Commonly known as: Senokot-S Take 1 tablet by mouth 2 (two) times daily.   simethicone 125 MG chewable tablet Commonly known as: MYLICON Chew 935 mg by mouth in the morning and at bedtime.   traZODone 50 MG tablet Commonly known as: DESYREL Take 0.5-1 tablets (25-50 mg total) by mouth at bedtime as needed for sleep. What changed: how much to take       Follow-up Information    Newt Minion, MD Follow up in 2 week(s).   Specialty: Orthopedic Surgery Contact information: [place] [address] [facility].   Specialty: Home Health Services Why: [place].  Phone Number: [phone]             Allergies  Allergen Reactions  . Avelox [Moxifloxacin Hcl In Nacl] Other (See Comments)    Felt bad  . Biaxin [Clarithromycin] Nausea Only  . Cefuroxime Diarrhea  . Levaquin [Levofloxacin In D5w] Diarrhea  . Minocycline Nausea And Vomiting  . Paxil [Paroxetine Hcl] Nausea And Vomiting  . Prednisone Nausea Only  . Remeron [Mirtazapine]     Doesn't remember   . Sulfur Nausea And Vomiting  . Trimox [Amoxicillin] Other (See Comments)    Doesn't remember  Has patient had a PCN reaction causing immediate rash, facial/tongue/throat swelling, SOB or lightheadedness with hypotension: Unknown Has patient had a PCN reaction causing severe rash involving mucus membranes or skin necrosis: Unknown Has patient had a PCN reaction that required hospitalization: Unknown Has patient had a PCN reaction  occurring within the last 10 years: Unknown If all of the above answers are "NO",  then may proceed with Cephalosporin use.  Arman Filter Hcl] Other (See Comments)    Doesn't remember     Consultations:  Orthopedic Surgery Dr. Sharol Given  Procedures/Studies: DG Chest Port 1 View  Result Date: 11/11/2019 CLINICAL DATA:  Right pelvic fractures, fall EXAM: PORTABLE CHEST 1 VIEW COMPARISON:  11/13/2018 FINDINGS: Stable mild cardiomegaly without CHF. No focal pneumonia. Lungs remain clear. No large effusion or pneumothorax. Trachea midline. Aorta atherosclerotic. Bones are osteopenic. Previous vertebral augmentation changes noted of the spine. IMPRESSION: Mild cardiomegaly without acute chest process.  Stable exam. Electronically Signed   By: Jerilynn Mages.  Shick M.D.   On: 11/11/2019 13:46   DG Hip Unilat With Pelvis 2-3 Views Right  Result Date: 11/11/2019 CLINICAL DATA:  86 year old female with hip pain on the right EXAM: DG HIP (WITH OR WITHOUT PELVIS) 2-3V RIGHT COMPARISON:  05/23/2015 FINDINGS: Osteopenia. Fracture of the right pelvis involving the acetabulum, with disruption of the iliopectineal line. Fracture of the inferior pubic ramus. Bilateral hips projects normally over the acetabula. Surgical changes of right hip fixation, with dynamic plate/screw fixation. Lucency present at the femoral head/neck screw IMPRESSION: Right pelvic fracture involving the acetabulum, as well as the right inferior pubic ramus. CT imaging is indicated. Surgical changes of prior right hip fixation, with some lucency at the femoral neck/head screw. Electronically Signed   By: Corrie Mckusick D.O.   On: 11/11/2019 13:46    Subjective: And examined at bedside sitting in the chair and denied any complaints.  No nausea or vomiting.  States that she felt sore but states that she was able to ambulate.  No other concerns and wanting to go home.  She understands and agrees the plan of care and her pain is relatively well  controlled.   Discharge Exam: Vitals:   11/12/19 0615 11/12/19 0940  BP: (!) 156/75 (!) 153/82  Pulse: 76 84  Resp: 20 18  Temp: 98.9 F (37.2 C)   SpO2: 99%    Vitals:   11/11/19 2125 11/12/19 0116 11/12/19 0615 11/12/19 0940  BP: (!) 158/80 (!) 141/74 (!) 156/75 (!) 153/82  Pulse: 87 70 76 84  Resp: 17 16 20 18   Temp: 98.9 F (37.2 C) 98.5 F (36.9 C) 98.9 F (37.2 C)   TempSrc: Oral Oral Oral   SpO2: 99% 96% 99%   Weight:      Height:       General: Pt is alert, awake, not in acute distress Cardiovascular: RRR, S1/S2 +, no rubs, no gallops Respiratory: Diminished bilaterally, no wheezing, no rhonchi Abdominal: Soft, NT, ND, bowel sounds + Extremities: Trace edema, no cyanosis  The results of significant diagnostics from this hospitalization (including imaging, microbiology, ancillary and laboratory) are listed below for reference.    Microbiology: Recent Results (from the past 240 hour(s))  SARS Coronavirus 2 by RT PCR (hospital order, performed in [facility] hospital lab) Nasopharyngeal Nasopharyngeal Swab     Status: None   Collection Time: 11/11/19  3:13 PM   Specimen: Nasopharyngeal Swab  Result Value Ref Range Status   SARS Coronavirus 2 NEGATIVE NEGATIVE Final    Comment: (NOTE) SARS-CoV-2 target nucleic acids are NOT DETECTED.  The SARS-CoV-2 RNA is generally detectable in upper and lower respiratory specimens during the acute phase of infection. The lowest concentration of SARS-CoV-2 viral copies this assay can detect is 250 copies / mL. A negative result does not preclude SARS-CoV-2 infection and should not be used as the sole basis for treatment or  other patient management decisions.  A negative result may occur with improper specimen collection / handling, submission of specimen other than nasopharyngeal swab, presence of viral mutation(s) within the areas targeted by this assay, and inadequate number of viral copies (<250 copies / mL). A negative  result must be combined with clinical observations, patient history, and epidemiological information.  Fact Sheet for Patients:   StrictlyIdeas.no  Fact Sheet for Healthcare Providers: BankingDealers.co.za  This test is not yet approved or  cleared by the [place] FDA and has been authorized for detection and/or diagnosis of SARS-CoV-2 by FDA under an Emergency Use Authorization (EUA).  This EUA will remain in effect (meaning this test can be used) for the duration of the COVID-19 declaration under Section 564(b)(1) of the Act, 21 U.S.C. section 360bbb-3(b)(1), unless the authorization is terminated or revoked sooner.  Performed at [facility], [place] [address], [place], [place] [address]     Labs: BNP (last 3 results) No results for input(s): BNP in the last 8760 hours. Basic Metabolic Panel: Recent Labs  Lab 11/11/19 1319 11/12/19 0405  NA 136 135  K 4.7 3.8  CL 101 102  CO2 23 25  GLUCOSE 132* 87  BUN 25* 23  CREATININE 1.10* 0.94  CALCIUM 9.0 9.0  MG  --  2.4  PHOS  --  2.7   Liver Function Tests: Recent Labs  Lab 11/12/19 0405  AST 24  ALT 17  ALKPHOS 77  BILITOT 0.8  PROT 5.9*  ALBUMIN 3.3*   No results for input(s): LIPASE, AMYLASE in the last 168 hours. No results for input(s): AMMONIA in the last 168 hours. CBC: Recent Labs  Lab 11/11/19 1319 11/12/19 0405  WBC 10.2 11.9*  NEUTROABS 9.1* 9.9*  HGB 9.0* 9.0*  HCT 28.6* 28.7*  MCV 88.5 89.1  PLT 243 283   Cardiac Enzymes: No results for input(s): CKTOTAL, CKMB, CKMBINDEX, TROPONINI in the last 168 hours. BNP: Invalid input(s): POCBNP CBG: No results for input(s): GLUCAP in the last 168 hours. D-Dimer No results for input(s): DDIMER in the last 72 hours. Hgb A1c No results for input(s): HGBA1C in the last 72 hours. Lipid Profile No results for input(s): CHOL, HDL, LDLCALC, TRIG, CHOLHDL, LDLDIRECT in the last 72  hours. Thyroid function studies No results for input(s): TSH, T4TOTAL, T3FREE, THYROIDAB in the last 72 hours.  Invalid input(s): FREET3 Anemia work up No results for input(s): VITAMINB12, FOLATE, FERRITIN, TIBC, IRON, RETICCTPCT in the last 72 hours. Urinalysis    Component Value Date/Time   COLORURINE YELLOW 11/25/2016 1803   APPEARANCEUR CLEAR 11/25/2016 1803   LABSPEC 1.013 11/25/2016 1803   PHURINE 6.0 11/25/2016 1803   GLUCOSEU NEGATIVE 11/25/2016 1803   HGBUR SMALL (A) 11/25/2016 1803   BILIRUBINUR NEGATIVE 11/25/2016 1803   KETONESUR NEGATIVE 11/25/2016 1803   PROTEINUR 30 (A) 11/25/2016 1803   UROBILINOGEN 0.2 05/24/2013 1634   NITRITE NEGATIVE 11/25/2016 1803   LEUKOCYTESUR NEGATIVE 11/25/2016 1803   Sepsis Labs Invalid input(s): PROCALCITONIN,  WBC,  LACTICIDVEN Microbiology Recent Results (from the past 240 hour(s))  SARS Coronavirus 2 by RT PCR (hospital order, performed in [place] hospital lab) Nasopharyngeal Nasopharyngeal Swab     Status: None   Collection Time: 11/11/19  3:13 PM   Specimen: Nasopharyngeal Swab  Result Value Ref Range Status   SARS Coronavirus 2 NEGATIVE NEGATIVE Final    Comment: (NOTE) SARS-CoV-2 target nucleic acids are NOT DETECTED.  The SARS-CoV-2 RNA is generally detectable in  upper and lower respiratory specimens during the acute phase of infection. The lowest concentration of SARS-CoV-2 viral copies this assay can detect is 250 copies / mL. A negative result does not preclude SARS-CoV-2 infection and should not be used as the sole basis for treatment or other patient management decisions.  A negative result may occur with improper specimen collection / handling, submission of specimen other than nasopharyngeal swab, presence of viral mutation(s) within the areas targeted by this assay, and inadequate number of viral copies (<250 copies / mL). A negative result must be combined with clinical observations, patient history, and  epidemiological information.  Fact Sheet for Patients:   StrictlyIdeas.no  Fact Sheet for Healthcare Providers: BankingDealers.co.za  This test is not yet approved or  cleared by the [place] FDA and has been authorized for detection and/or diagnosis of SARS-CoV-2 by FDA under an Emergency Use Authorization (EUA).  This EUA will remain in effect (meaning this test can be used) for the duration of the COVID-19 declaration under Section 564(b)(1) of the Act, 21 U.S.C. section 360bbb-3(b)(1), unless the authorization is terminated or revoked sooner.  Performed at [facility], [place] [address], [place], [place] [address]    Time coordinating discharge: 35 minutes  SIGNED:  Kerney Elbe, DO [facility] 11/12/2019, 5:51 PM Pager is on [place]  If 7PM-7AM, please contact night-coverage [email or web address]

## 2019-11-12 NOTE — Plan of Care (Signed)

## 2019-11-12 NOTE — TOC Initial Note (Addendum)
Transition of Care [facility]) - Initial/Assessment Note    Patient Details  Name: Katrina Burnett MRN: [id number] Date of Birth: 03-04-1925  Transition of Care [facility]) CM/SW Contact:    Lia Hopping, [place] Phone Number: 11/12/2019, 11:58 AM  Clinical Narrative:        Patient admitted from home after a fall. Patient has a nondisplaced acetabular and rami fracture/weight bearing as tolerated. PT recommends HHPT/OT.    CSW met with the patient at beside to discuss her discharge to home. Patient alert and oriented x4 and very pleasant. Patient lives at home with her son [name]. Patient reports she is fairly independent at home with ADL'S. Patient uses a rolling walker at home and also has a modified shower. Patient reports she has used [facility] person care services in the past for day and evening care. Patient plans to restart services with them. CSW provided the patient with a list of home health agencies. Patient chose [facility] ([facility] CSW reached out to the agency to confirm staff available. Agency information on AVS.  Patient told CSW to contact her daughter [name] about her discharge plan. CSW provided an update to the patient daughter [name]. [name] reports she will notify the patient son [name].  Family is requesting the patient discharge by [facility]. Patient understands her insurance may or may not cover the cost. Patient agreeable to transport. Patient family will be at home to accept the patient.   Expected Discharge Plan: [place] Barriers to Discharge: No Barriers Identified   Patient Goals and CMS Choice Patient states their goals for this hospitalization and ongoing recovery are:: "I am ready to go home." CMS Medicare.gov Compare Post Acute Care list provided to:: Patient Choice offered to / list presented to : Patient  Expected Discharge Plan and Services Expected Discharge Plan: [place] In-house Referral: Clinical Social Work Discharge  Planning Services: CM Consult Post Acute Care Choice: [place] arrangements for the past 2 months: [place] Expected Discharge Date: 11/12/19               DME Arranged: N/A DME Agency: NA       HH Arranged: PT, OT [place] Agency: [facility] ([place]) Date [place]: 11/12/19 Time [place]: [id number] Representative spoke with at [place]: Santiago Glad  Prior Living Arrangements/Services Living arrangements for the past 2 months: Single Family Home Lives with:: Adult Children Patient language and need for interpreter reviewed:: No Do you feel safe going back to the place where you live?: No      Need for Family Participation in Patient Care: Yes (Comment) Care giver support system in place?: Yes (comment)   Criminal Activity/Legal Involvement Pertinent to Current Situation/Hospitalization: No - Comment as needed  Activities of Daily Living Home Assistive Devices/Equipment: Gilford Rile (specify type) ADL Screening (condition at time of admission) Patient's cognitive ability adequate to safely complete daily activities?: Yes Is the patient deaf or have difficulty hearing?: No Does the patient have difficulty seeing, even when wearing glasses/contacts?: No Does the patient have difficulty concentrating, remembering, or making decisions?: No Patient able to express need for assistance with ADLs?: Yes Does the patient have difficulty dressing or bathing?: No Independently performs ADLs?: Yes (appropriate for developmental age) Does the patient have difficulty walking or climbing stairs?: Yes Weakness of Legs: Right Weakness of Arms/Hands: Left  Permission Sought/Granted Permission sought to share information with : Case Manager Permission granted to share  information with : Yes, Verbal Permission Granted              Emotional Assessment Appearance:: Appears stated age Attitude/Demeanor/Rapport: Engaged Affect (typically observed):  Accepting, Pleasant Orientation: : Oriented to Self, Oriented to Place, Oriented to  Time, Oriented to Situation Alcohol / Substance Use: Not Applicable Psych Involvement: No (comment)  Admission diagnosis:  Pelvic fracture ([place]) [S32.9XXA] Closed right hip fracture, initial encounter [facility]) [S72.001A] Patient Active Problem List   Diagnosis Date Noted  . Pelvic fracture ([place]) 11/11/2019  . Pneumothorax on left 10/24/2018  . Pneumothorax, left 10/24/2018  . CAP (community acquired pneumonia) 11/25/2016  . Anemia 11/25/2016  . Hyponatremia 11/25/2016  . Fracture, intertrochanteric, right femur ([place]) 05/26/2015  . Closed fracture of femur ([place])   . Post-operative pain   . Acute blood loss anemia   . Adjustment disorder with mixed anxiety and depressed mood   . Fall   . Abnormality of gait   . Femur fracture, right, closed, initial encounter 05/23/2015  . Fracture of femoral neck, right, closed ([place]) 05/22/2015  . Femoral neck fracture, right, closed, initial encounter 05/22/2015  . Syncope 05/30/2012  . Nausea & vomiting 05/30/2012  . Chest pain 05/30/2012  . Hypotension 05/30/2012  . Esophageal reflux 12/16/2011  . Lumbar compression fracture ([place])   . Polymyalgia ([place])    PCP:  Jani Gravel, MD Pharmacy:   [place], [address] [facility] [address] [place] [address] Phone: [phone] Fax: [phone]  [id number] [place] [address] [place]Phone: [phone]: [phone]    Social Determinants of Health (SDOH) Interventions    Readmission Risk Interventions No flowsheet data found.

## 2019-11-15 ENCOUNTER — Telehealth: Payer: Self-pay | Admitting: Orthopedic Surgery

## 2019-11-15 NOTE — Telephone Encounter (Signed)
Museum/gallery conservator from [employer] called.   She needs verbal orders to continue working with the patient post fall   Call back: [phone]

## 2019-11-15 NOTE — Telephone Encounter (Signed)
Called and lm on vm to advise that the pt was seen in consult in the hospital 11/12/19 right hip and may be progressive ambulation wtbat and needs to follow up in the office around the week of 11/26/19 to call with any questions verbal ok for continued PT

## 2019-11-18 ENCOUNTER — Emergency Department (HOSPITAL_COMMUNITY): Payer: Medicare Other

## 2019-11-18 ENCOUNTER — Other Ambulatory Visit: Payer: Self-pay

## 2019-11-18 ENCOUNTER — Inpatient Hospital Stay (HOSPITAL_COMMUNITY)
Admission: EM | Admit: 2019-11-18 | Discharge: 2019-11-23 | DRG: 392 | Disposition: A | Discharge status: Home Health | Payer: Medicare Other | Attending: Family Medicine | Admitting: Family Medicine

## 2019-11-18 DIAGNOSIS — Z8249 Family history of ischemic heart disease and other diseases of the circulatory system: Secondary | ICD-10-CM

## 2019-11-18 DIAGNOSIS — W19XXXA Unspecified fall, initial encounter: Secondary | ICD-10-CM | POA: Diagnosis present

## 2019-11-18 DIAGNOSIS — I959 Hypotension, unspecified: Secondary | ICD-10-CM | POA: Diagnosis not present

## 2019-11-18 DIAGNOSIS — M810 Age-related osteoporosis without current pathological fracture: Secondary | ICD-10-CM | POA: Diagnosis present

## 2019-11-18 DIAGNOSIS — D75839 Thrombocytosis, unspecified: Secondary | ICD-10-CM

## 2019-11-18 DIAGNOSIS — Z9841 Cataract extraction status, right eye: Secondary | ICD-10-CM

## 2019-11-18 DIAGNOSIS — Z79899 Other long term (current) drug therapy: Secondary | ICD-10-CM

## 2019-11-18 DIAGNOSIS — Z961 Presence of intraocular lens: Secondary | ICD-10-CM | POA: Diagnosis present

## 2019-11-18 DIAGNOSIS — Z881 Allergy status to other antibiotic agents status: Secondary | ICD-10-CM

## 2019-11-18 DIAGNOSIS — M4856XA Collapsed vertebra, not elsewhere classified, lumbar region, initial encounter for fracture: Secondary | ICD-10-CM | POA: Diagnosis present

## 2019-11-18 DIAGNOSIS — Z8616 Personal history of COVID-19: Secondary | ICD-10-CM

## 2019-11-18 DIAGNOSIS — R0789 Other chest pain: Secondary | ICD-10-CM | POA: Diagnosis not present

## 2019-11-18 DIAGNOSIS — Z62820 Parent-biological child conflict: Secondary | ICD-10-CM | POA: Diagnosis present

## 2019-11-18 DIAGNOSIS — Z87891 Personal history of nicotine dependence: Secondary | ICD-10-CM

## 2019-11-18 DIAGNOSIS — I10 Essential (primary) hypertension: Secondary | ICD-10-CM | POA: Diagnosis not present

## 2019-11-18 DIAGNOSIS — K2270 Barrett's esophagus without dysplasia: Secondary | ICD-10-CM | POA: Diagnosis present

## 2019-11-18 DIAGNOSIS — K2100 Gastro-esophageal reflux disease with esophagitis, without bleeding: Principal | ICD-10-CM | POA: Diagnosis present

## 2019-11-18 DIAGNOSIS — K219 Gastro-esophageal reflux disease without esophagitis: Secondary | ICD-10-CM | POA: Diagnosis present

## 2019-11-18 DIAGNOSIS — M19042 Primary osteoarthritis, left hand: Secondary | ICD-10-CM | POA: Diagnosis present

## 2019-11-18 DIAGNOSIS — F329 Major depressive disorder, single episode, unspecified: Secondary | ICD-10-CM | POA: Diagnosis present

## 2019-11-18 DIAGNOSIS — R079 Chest pain, unspecified: Secondary | ICD-10-CM

## 2019-11-18 DIAGNOSIS — Z882 Allergy status to sulfonamides status: Secondary | ICD-10-CM

## 2019-11-18 DIAGNOSIS — K922 Gastrointestinal hemorrhage, unspecified: Secondary | ICD-10-CM | POA: Diagnosis not present

## 2019-11-18 DIAGNOSIS — Z888 Allergy status to other drugs, medicaments and biological substances status: Secondary | ICD-10-CM

## 2019-11-18 DIAGNOSIS — K209 Esophagitis, unspecified without bleeding: Secondary | ICD-10-CM

## 2019-11-18 DIAGNOSIS — S329XXA Fracture of unspecified parts of lumbosacral spine and pelvis, initial encounter for closed fracture: Secondary | ICD-10-CM | POA: Diagnosis present

## 2019-11-18 DIAGNOSIS — N179 Acute kidney failure, unspecified: Secondary | ICD-10-CM | POA: Diagnosis present

## 2019-11-18 DIAGNOSIS — Z9842 Cataract extraction status, left eye: Secondary | ICD-10-CM

## 2019-11-18 DIAGNOSIS — Z800 Family history of malignant neoplasm of digestive organs: Secondary | ICD-10-CM

## 2019-11-18 DIAGNOSIS — F419 Anxiety disorder, unspecified: Secondary | ICD-10-CM | POA: Diagnosis present

## 2019-11-18 DIAGNOSIS — Z811 Family history of alcohol abuse and dependence: Secondary | ICD-10-CM

## 2019-11-18 DIAGNOSIS — R457 State of emotional shock and stress, unspecified: Secondary | ICD-10-CM | POA: Diagnosis not present

## 2019-11-18 DIAGNOSIS — K2289 Other specified disease of esophagus: Secondary | ICD-10-CM

## 2019-11-18 DIAGNOSIS — M353 Polymyalgia rheumatica: Secondary | ICD-10-CM | POA: Diagnosis present

## 2019-11-18 DIAGNOSIS — E876 Hypokalemia: Secondary | ICD-10-CM

## 2019-11-18 DIAGNOSIS — D638 Anemia in other chronic diseases classified elsewhere: Secondary | ICD-10-CM | POA: Diagnosis present

## 2019-11-18 DIAGNOSIS — D62 Acute posthemorrhagic anemia: Secondary | ICD-10-CM | POA: Diagnosis present

## 2019-11-18 DIAGNOSIS — R748 Abnormal levels of other serum enzymes: Secondary | ICD-10-CM | POA: Diagnosis present

## 2019-11-18 DIAGNOSIS — Z638 Other specified problems related to primary support group: Secondary | ICD-10-CM

## 2019-11-18 DIAGNOSIS — Z880 Allergy status to penicillin: Secondary | ICD-10-CM

## 2019-11-18 DIAGNOSIS — S32000A Wedge compression fracture of unspecified lumbar vertebra, initial encounter for closed fracture: Secondary | ICD-10-CM | POA: Diagnosis present

## 2019-11-18 DIAGNOSIS — R000 Tachycardia, unspecified: Secondary | ICD-10-CM | POA: Diagnosis not present

## 2019-11-18 DIAGNOSIS — G8929 Other chronic pain: Secondary | ICD-10-CM | POA: Diagnosis present

## 2019-11-18 DIAGNOSIS — M19041 Primary osteoarthritis, right hand: Secondary | ICD-10-CM | POA: Diagnosis present

## 2019-11-18 DIAGNOSIS — Z66 Do not resuscitate: Secondary | ICD-10-CM | POA: Diagnosis present

## 2019-11-18 DIAGNOSIS — D473 Essential (hemorrhagic) thrombocythemia: Secondary | ICD-10-CM | POA: Diagnosis not present

## 2019-11-18 DIAGNOSIS — S32591A Other specified fracture of right pubis, initial encounter for closed fracture: Secondary | ICD-10-CM | POA: Diagnosis present

## 2019-11-18 DIAGNOSIS — Z7982 Long term (current) use of aspirin: Secondary | ICD-10-CM

## 2019-11-18 DIAGNOSIS — K449 Diaphragmatic hernia without obstruction or gangrene: Secondary | ICD-10-CM

## 2019-11-18 HISTORY — DX: Cerebral infarction, unspecified: I63.9

## 2019-11-18 MED ORDER — FENTANYL CITRATE (PF) 100 MCG/2ML IJ SOLN
50.0000 ug | Freq: Once | INTRAMUSCULAR | Status: AC
  Administered 2019-11-19: 50 ug via INTRAVENOUS
  Filled 2019-11-18: qty 2

## 2019-11-18 MED ORDER — SODIUM CHLORIDE 0.9% FLUSH
3.0000 mL | Freq: Once | INTRAVENOUS | Status: AC
  Administered 2019-11-19: 3 mL via INTRAVENOUS

## 2019-11-18 MED ORDER — ASPIRIN 81 MG PO CHEW
324.0000 mg | CHEWABLE_TABLET | Freq: Once | ORAL | Status: AC
  Administered 2019-11-19: 324 mg via ORAL
  Filled 2019-11-18: qty 4
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
## 2019-11-19 ENCOUNTER — Observation Stay (HOSPITAL_COMMUNITY): Payer: Medicare Other | Admitting: Certified Registered"

## 2019-11-19 ENCOUNTER — Emergency Department (HOSPITAL_COMMUNITY): Payer: Medicare Other

## 2019-11-19 ENCOUNTER — Other Ambulatory Visit: Payer: Self-pay

## 2019-11-19 ENCOUNTER — Encounter (HOSPITAL_COMMUNITY)
Admission: EM | Disposition: A | Discharge status: Home Health | Payer: Self-pay | Source: Home / Self Care | Attending: Family Medicine

## 2019-11-19 ENCOUNTER — Observation Stay (HOSPITAL_COMMUNITY): Payer: Medicare Other

## 2019-11-19 ENCOUNTER — Encounter (HOSPITAL_COMMUNITY): Payer: Self-pay | Admitting: Internal Medicine

## 2019-11-19 DIAGNOSIS — K209 Esophagitis, unspecified without bleeding: Secondary | ICD-10-CM

## 2019-11-19 DIAGNOSIS — Z62820 Parent-biological child conflict: Secondary | ICD-10-CM | POA: Diagnosis not present

## 2019-11-19 DIAGNOSIS — R933 Abnormal findings on diagnostic imaging of other parts of digestive tract: Secondary | ICD-10-CM

## 2019-11-19 DIAGNOSIS — R748 Abnormal levels of other serum enzymes: Secondary | ICD-10-CM | POA: Diagnosis not present

## 2019-11-19 DIAGNOSIS — K922 Gastrointestinal hemorrhage, unspecified: Secondary | ICD-10-CM

## 2019-11-19 DIAGNOSIS — K228 Other specified diseases of esophagus: Secondary | ICD-10-CM

## 2019-11-19 DIAGNOSIS — K449 Diaphragmatic hernia without obstruction or gangrene: Secondary | ICD-10-CM

## 2019-11-19 DIAGNOSIS — Z66 Do not resuscitate: Secondary | ICD-10-CM | POA: Diagnosis not present

## 2019-11-19 DIAGNOSIS — G8929 Other chronic pain: Secondary | ICD-10-CM | POA: Diagnosis not present

## 2019-11-19 DIAGNOSIS — F419 Anxiety disorder, unspecified: Secondary | ICD-10-CM | POA: Diagnosis not present

## 2019-11-19 DIAGNOSIS — J984 Other disorders of lung: Secondary | ICD-10-CM | POA: Diagnosis not present

## 2019-11-19 DIAGNOSIS — K219 Gastro-esophageal reflux disease without esophagitis: Secondary | ICD-10-CM

## 2019-11-19 DIAGNOSIS — Z8616 Personal history of COVID-19: Secondary | ICD-10-CM | POA: Diagnosis not present

## 2019-11-19 DIAGNOSIS — K2270 Barrett's esophagus without dysplasia: Secondary | ICD-10-CM | POA: Diagnosis not present

## 2019-11-19 DIAGNOSIS — N179 Acute kidney failure, unspecified: Secondary | ICD-10-CM | POA: Diagnosis not present

## 2019-11-19 DIAGNOSIS — S32591A Other specified fracture of right pubis, initial encounter for closed fracture: Secondary | ICD-10-CM | POA: Diagnosis not present

## 2019-11-19 DIAGNOSIS — D62 Acute posthemorrhagic anemia: Secondary | ICD-10-CM | POA: Diagnosis not present

## 2019-11-19 DIAGNOSIS — K2289 Other specified disease of esophagus: Secondary | ICD-10-CM

## 2019-11-19 DIAGNOSIS — I700 Atherosclerosis of aorta: Secondary | ICD-10-CM | POA: Diagnosis not present

## 2019-11-19 DIAGNOSIS — B49 Unspecified mycosis: Secondary | ICD-10-CM | POA: Diagnosis not present

## 2019-11-19 DIAGNOSIS — K2100 Gastro-esophageal reflux disease with esophagitis, without bleeding: Secondary | ICD-10-CM | POA: Diagnosis not present

## 2019-11-19 DIAGNOSIS — M4856XA Collapsed vertebra, not elsewhere classified, lumbar region, initial encounter for fracture: Secondary | ICD-10-CM | POA: Diagnosis not present

## 2019-11-19 DIAGNOSIS — J9811 Atelectasis: Secondary | ICD-10-CM | POA: Diagnosis not present

## 2019-11-19 HISTORY — PX: BIOPSY: SHX5522

## 2019-11-19 HISTORY — PX: ESOPHAGOGASTRODUODENOSCOPY (EGD) WITH PROPOFOL: SHX5813

## 2019-11-19 LAB — CBC WITH DIFFERENTIAL/PLATELET
Abs Immature Granulocytes: 0.18 10*3/uL — ABNORMAL HIGH (ref 0.00–0.07)
Basophils Absolute: 0.1 10*3/uL (ref 0.0–0.1)
Basophils Relative: 0 %
Eosinophils Absolute: 0 10*3/uL (ref 0.0–0.5)
Eosinophils Relative: 0 %
HCT: 30.1 % — ABNORMAL LOW (ref 36.0–46.0)
Hemoglobin: 9.4 g/dL — ABNORMAL LOW (ref 12.0–15.0)
Immature Granulocytes: 1 %
Lymphocytes Relative: 7 %
Lymphs Abs: 1.4 10*3/uL (ref 0.7–4.0)
MCH: 28 pg (ref 26.0–34.0)
MCHC: 31.2 g/dL (ref 30.0–36.0)
MCV: 89.6 fL (ref 80.0–100.0)
Monocytes Absolute: 0.9 10*3/uL (ref 0.1–1.0)
Monocytes Relative: 5 %
Neutro Abs: 16.5 10*3/uL — ABNORMAL HIGH (ref 1.7–7.7)
Neutrophils Relative %: 87 %
Platelets: 593 10*3/uL — ABNORMAL HIGH (ref 150–400)
RBC: 3.36 MIL/uL — ABNORMAL LOW (ref 3.87–5.11)
RDW: 15.7 % — ABNORMAL HIGH (ref 11.5–15.5)
WBC: 19.1 10*3/uL — ABNORMAL HIGH (ref 4.0–10.5)
nRBC: 0 % (ref 0.0–0.2)

## 2019-11-19 LAB — COMPREHENSIVE METABOLIC PANEL
ALT: 15 U/L (ref 0–44)
ALT: 16 U/L (ref 0–44)
AST: 21 U/L (ref 15–41)
AST: 20 U/L (ref 15–41)
Albumin: 3.4 g/dL — ABNORMAL LOW (ref 3.5–5.0)
Albumin: 2.9 g/dL — ABNORMAL LOW (ref 3.5–5.0)
Alkaline Phosphatase: 137 U/L — ABNORMAL HIGH (ref 38–126)
Alkaline Phosphatase: 125 U/L (ref 38–126)
Anion gap: 10 (ref 5–15)
Anion gap: 11 (ref 5–15)
BUN: 14 mg/dL (ref 8–23)
BUN: 32 mg/dL — ABNORMAL HIGH (ref 8–23)
CO2: 25 mmol/L (ref 22–32)
CO2: 23 mmol/L (ref 22–32)
Calcium: 10.3 mg/dL (ref 8.9–10.3)
Calcium: 9.1 mg/dL (ref 8.9–10.3)
Chloride: 101 mmol/L (ref 98–111)
Chloride: 103 mmol/L (ref 98–111)
Creatinine, Ser: 0.88 mg/dL (ref 0.44–1.00)
Creatinine, Ser: 0.87 mg/dL (ref 0.44–1.00)
GFR calc Af Amer: >60 mL/min (ref >60)
GFR calc Af Amer: >60 mL/min (ref >60)
GFR calc non Af Amer: 57 mL/min — ABNORMAL LOW (ref >60)
GFR calc non Af Amer: 57 mL/min — ABNORMAL LOW (ref >60)
Glucose, Bld: 110 mg/dL — ABNORMAL HIGH (ref 70–99)
Glucose, Bld: 113 mg/dL — ABNORMAL HIGH (ref 70–99)
Potassium: 3.3 mmol/L — ABNORMAL LOW (ref 3.5–5.1)
Potassium: 3.7 mmol/L (ref 3.5–5.1)
Sodium: 136 mmol/L (ref 135–145)
Sodium: 137 mmol/L (ref 135–145)
Total Bilirubin: 0.4 mg/dL (ref 0.3–1.2)
Total Bilirubin: 0.1 mg/dL — ABNORMAL LOW (ref 0.3–1.2)
Total Protein: 6.7 g/dL (ref 6.5–8.1)
Total Protein: 5.8 g/dL — ABNORMAL LOW (ref 6.5–8.1)

## 2019-11-19 LAB — IRON AND TIBC
Iron: 56 ug/dL (ref 28–170)
Saturation Ratios: 15 % (ref 10.4–31.8)
TIBC: 385 ug/dL (ref 250–450)
UIBC: 329 ug/dL

## 2019-11-19 LAB — PROCALCITONIN: Procalcitonin: <0.1 ng/mL

## 2019-11-19 LAB — CBC
HCT: 36.5 % (ref 36.0–46.0)
Hemoglobin: 11.4 g/dL — ABNORMAL LOW (ref 12.0–15.0)
MCH: 27.8 pg (ref 26.0–34.0)
MCHC: 31.2 g/dL (ref 30.0–36.0)
MCV: 89 fL (ref 80.0–100.0)
Platelets: 614 10*3/uL — ABNORMAL HIGH (ref 150–400)
RBC: 4.1 MIL/uL (ref 3.87–5.11)
RDW: 15.4 % (ref 11.5–15.5)
WBC: 13.4 10*3/uL — ABNORMAL HIGH (ref 4.0–10.5)
nRBC: 0 % (ref 0.0–0.2)

## 2019-11-19 LAB — LACTIC ACID, PLASMA
Lactic Acid, Venous: 1.3 mmol/L (ref 0.5–1.9)
Lactic Acid, Venous: 1.6 mmol/L (ref 0.5–1.9)

## 2019-11-19 LAB — TSH: TSH: 1.142 u[IU]/mL (ref 0.350–4.500)

## 2019-11-19 LAB — TROPONIN I (HIGH SENSITIVITY)
Troponin I (High Sensitivity): 15 ng/L (ref <18)
Troponin I (High Sensitivity): 12 ng/L (ref <18)

## 2019-11-19 LAB — SARS CORONAVIRUS 2 BY RT PCR (HOSPITAL ORDER, PERFORMED IN ~~LOC~~ HOSPITAL LAB): SARS Coronavirus 2: NEGATIVE

## 2019-11-19 LAB — FOLATE: Folate: 22.1 ng/mL (ref >5.9)

## 2019-11-19 LAB — VITAMIN B12: Vitamin B-12: 325 pg/mL (ref 180–914)

## 2019-11-19 LAB — CBG MONITORING, ED: Glucose-Capillary: 86 mg/dL (ref 70–99)

## 2019-11-19 SURGERY — ESOPHAGOGASTRODUODENOSCOPY (EGD) WITH PROPOFOL
Anesthesia: Monitor Anesthesia Care

## 2019-11-19 MED ORDER — HYDROMORPHONE HCL 1 MG/ML IJ SOLN
0.5000 mg | Freq: Once | INTRAMUSCULAR | Status: AC
  Administered 2019-11-19: 0.5 mg via INTRAVENOUS
  Filled 2019-11-19: qty 1

## 2019-11-19 MED ORDER — SODIUM CHLORIDE 0.9 % IV SOLN
INTRAVENOUS | Status: DC | PRN
  Administered 2019-11-19 – 2019-11-22 (×3): 250 mL via INTRAVENOUS

## 2019-11-19 MED ORDER — LIDOCAINE 2% (20 MG/ML) 5 ML SYRINGE
INTRAMUSCULAR | Status: DC | PRN
  Administered 2019-11-19: 100 mg via INTRAVENOUS

## 2019-11-19 MED ORDER — DIPHENHYDRAMINE HCL 50 MG/ML IJ SOLN
12.5000 mg | Freq: Once | INTRAMUSCULAR | Status: AC
  Administered 2019-11-19: 12.5 mg via INTRAVENOUS
  Filled 2019-11-19: qty 1

## 2019-11-19 MED ORDER — ONDANSETRON HCL 4 MG/2ML IJ SOLN
4.0000 mg | Freq: Four times a day (QID) | INTRAMUSCULAR | Status: DC | PRN
  Administered 2019-11-19: 4 mg via INTRAVENOUS
  Filled 2019-11-19: qty 2

## 2019-11-19 MED ORDER — PROPOFOL 500 MG/50ML IV EMUL
INTRAVENOUS | Status: DC | PRN
  Administered 2019-11-19: 100 ug/kg/min via INTRAVENOUS

## 2019-11-19 MED ORDER — LACTATED RINGERS IV SOLN: INTRAVENOUS | Status: DC | PRN

## 2019-11-19 MED ORDER — IOHEXOL 350 MG/ML SOLN
75.0000 mL | Freq: Once | INTRAVENOUS | Status: AC | PRN
  Administered 2019-11-19: 75 mL via INTRAVENOUS

## 2019-11-19 MED ORDER — PIPERACILLIN-TAZOBACTAM 3.375 G IVPB
3.3750 g | Freq: Three times a day (TID) | INTRAVENOUS | Status: DC
  Administered 2019-11-19 – 2019-11-22 (×9): 3.375 g via INTRAVENOUS
  Filled 2019-11-19 (×10): qty 50

## 2019-11-19 MED ORDER — ALPRAZOLAM 0.5 MG PO TABS
1.0000 mg | ORAL_TABLET | Freq: Three times a day (TID) | ORAL | Status: DC | PRN
  Administered 2019-11-19 – 2019-11-22 (×5): 1 mg via ORAL
  Filled 2019-11-19: qty 4
  Filled 2019-11-19 (×4): qty 2

## 2019-11-19 MED ORDER — BISACODYL 10 MG RE SUPP
10.0000 mg | Freq: Every day | RECTAL | Status: AC | PRN
  Administered 2019-11-19: 10 mg via RECTAL
  Filled 2019-11-19: qty 1

## 2019-11-19 MED ORDER — DORZOLAMIDE HCL 2 % OP SOLN
1.0000 [drp] | Freq: Two times a day (BID) | OPHTHALMIC | Status: DC
  Administered 2019-11-19 – 2019-11-23 (×9): 1 [drp] via OPHTHALMIC
  Filled 2019-11-19: qty 10

## 2019-11-19 MED ORDER — ASPIRIN EC 81 MG PO TBEC
81.0000 mg | DELAYED_RELEASE_TABLET | Freq: Every day | ORAL | Status: DC
  Administered 2019-11-20: 81 mg via ORAL
  Filled 2019-11-19: qty 1

## 2019-11-19 MED ORDER — PIPERACILLIN-TAZOBACTAM 3.375 G IVPB: 3.3750 g | Freq: Three times a day (TID) | INTRAVENOUS | Status: DC

## 2019-11-19 MED ORDER — ONDANSETRON HCL 4 MG PO TABS: 4.0000 mg | ORAL_TABLET | Freq: Four times a day (QID) | ORAL | Status: DC | PRN

## 2019-11-19 MED ORDER — SENNOSIDES-DOCUSATE SODIUM 8.6-50 MG PO TABS
1.0000 | ORAL_TABLET | Freq: Two times a day (BID) | ORAL | Status: DC
  Administered 2019-11-19 – 2019-11-23 (×7): 1 via ORAL
  Filled 2019-11-19 (×8): qty 1

## 2019-11-19 MED ORDER — LORAZEPAM 2 MG/ML IJ SOLN
0.5000 mg | Freq: Once | INTRAMUSCULAR | Status: AC
  Administered 2019-11-19: 0.5 mg via INTRAVENOUS
  Filled 2019-11-19: qty 1

## 2019-11-19 MED ORDER — HYDRALAZINE HCL 20 MG/ML IJ SOLN: 5.0000 mg | INTRAMUSCULAR | Status: DC | PRN

## 2019-11-19 MED ORDER — OXYCODONE-ACETAMINOPHEN 5-325 MG PO TABS
1.0000 | ORAL_TABLET | Freq: Four times a day (QID) | ORAL | Status: DC | PRN
  Administered 2019-11-19 – 2019-11-22 (×9): 1 via ORAL
  Filled 2019-11-19 (×9): qty 1

## 2019-11-19 MED ORDER — PANTOPRAZOLE SODIUM 40 MG IV SOLR
40.0000 mg | Freq: Two times a day (BID) | INTRAVENOUS | Status: DC
  Administered 2019-11-19 – 2019-11-23 (×9): 40 mg via INTRAVENOUS
  Filled 2019-11-19 (×9): qty 40

## 2019-11-19 MED ORDER — METHOCARBAMOL 500 MG PO TABS
500.0000 mg | ORAL_TABLET | Freq: Four times a day (QID) | ORAL | Status: DC | PRN
  Administered 2019-11-20: 500 mg via ORAL
  Filled 2019-11-19: qty 1

## 2019-11-19 MED ORDER — PROPOFOL 10 MG/ML IV BOLUS
INTRAVENOUS | Status: DC | PRN
  Administered 2019-11-19: 20 mg via INTRAVENOUS

## 2019-11-19 MED ORDER — LATANOPROST 0.005 % OP SOLN
1.0000 [drp] | Freq: Every day | OPHTHALMIC | Status: DC
  Administered 2019-11-19 – 2019-11-22 (×4): 1 [drp] via OPHTHALMIC
  Filled 2019-11-19 (×2): qty 2.5

## 2019-11-19 MED ORDER — TRAZODONE HCL 50 MG PO TABS
50.0000 mg | ORAL_TABLET | Freq: Every evening | ORAL | Status: DC | PRN
  Administered 2019-11-22: 50 mg via ORAL
  Filled 2019-11-19 (×2): qty 1

## 2019-11-19 SURGICAL SUPPLY — 15 items

## 2019-11-19 NOTE — Anesthesia Preprocedure Evaluation (Addendum)
Anesthesia Evaluation  Patient identified by MRN, date of birth, ID band Patient awake    Reviewed: Allergy & Precautions, NPO status , Patient's Chart, lab work & pertinent test results  Airway Mallampati: II  TM Distance: >3 FB Neck ROM: Full    Dental no notable dental hx. (+) Teeth Intact, Dental Advisory Given   Pulmonary former smoker,    Pulmonary exam normal breath sounds clear to auscultation       Cardiovascular Exercise Tolerance: Good Normal cardiovascular exam Rhythm:Regular Rate:Normal     Neuro/Psych Anxiety Depression  Neuromuscular disease    GI/Hepatic Neg liver ROS, hiatal hernia, GERD  Medicated and Controlled,  Endo/Other  negative endocrine ROS  Renal/GU negative Renal ROS     Musculoskeletal  (+) Arthritis ,   Abdominal   Peds  Hematology  (+) anemia ,   Anesthesia Other Findings   Reproductive/Obstetrics                            Anesthesia Physical Anesthesia Plan  ASA: IV  Anesthesia Plan: MAC   Post-op Pain Management:    Induction:   PONV Risk Score and Plan: Treatment may vary due to age or medical condition  Airway Management Planned: Nasal Cannula and Natural Airway  Additional Equipment:   Intra-op Plan:   Post-operative Plan:   Informed Consent: I have reviewed the patients History and Physical, chart, labs and discussed the procedure including the risks, benefits and alternatives for the proposed anesthesia with the patient or authorized representative who has indicated his/her understanding and acceptance.     Dental advisory given  Plan Discussed with:   Anesthesia Plan Comments: (EGD for Hiatal hernia v esophageal mass)       Anesthesia Quick Evaluation

## 2019-11-19 NOTE — ED Notes (Signed)
Attempted report x1. 

## 2019-11-19 NOTE — ED Notes (Signed)
Placing on 2 lpm for comfort
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
## 2019-11-19 NOTE — Anesthesia Postprocedure Evaluation (Signed)
Anesthesia Post Note  Patient: Katrina Burnett  Procedure(s) Performed: ESOPHAGOGASTRODUODENOSCOPY (EGD) WITH PROPOFOL (N/A ) BIOPSY     Patient location during evaluation: Endoscopy Anesthesia Type: MAC Level of consciousness: awake and alert Pain management: pain level controlled Vital Signs Assessment: post-procedure vital signs reviewed and stable Respiratory status: spontaneous breathing, nonlabored ventilation, respiratory function stable and patient connected to nasal cannula oxygen Cardiovascular status: blood pressure returned to baseline and stable Postop Assessment: no apparent nausea or vomiting Anesthetic complications: no   No complications documented.  Last Vitals:  Vitals:   11/19/19 1325 11/19/19 1436  BP:  114/70  Pulse: (!) 106 100  Resp: (!) 22 (!) 31  Temp: 36.6 C 36.9 C  SpO2: 99% 95%    Last Pain:  Vitals:   11/19/19 1436  TempSrc: Axillary  PainSc: [place]

## 2019-11-19 NOTE — ED Notes (Signed)
Son [name] keeps calling requesting information. Seen RN melanie's previous note

## 2019-11-19 NOTE — ED Notes (Signed)
Patient has used bedpan several times this morning, with complaint of needing to have a bowel movement. Patient has only been able to put out small amounts of urine no bowel movement.

## 2019-11-19 NOTE — Op Note (Signed)
[facility] Patient Name: Katrina Burnett Procedure Date : 11/19/2019 MRN: [id number] Attending MD: Gatha Mayer , MD Date of Birth: 1925/02/06 CSN: [id number] Age: 86 Admit Type: Inpatient Procedure:                Upper GI endoscopy Indications:              Abnormal CT of the GI tract ? mass vs hiatal hernia Providers:                Gatha Mayer, MD, Theodora Blow, Technician,                            Lance Coon, CRNA, Josie Dixon, RN Referring MD:              Medicines:                Propofol per Anesthesia, Monitored Anesthesia Care Complications:            No immediate complications. Estimated Blood Loss:     Estimated blood loss was minimal. Procedure:                Pre-Anesthesia Assessment:                           - Prior to the procedure, a History and Physical                            was performed, and patient medications and                            allergies were reviewed. The patient's tolerance of                            previous anesthesia was also reviewed. The risks                            and benefits of the procedure and the sedation                            options and risks were discussed with the patient.                            All questions were answered, and informed consent                            was obtained. Prior Anticoagulants: The patient has                            taken no previous anticoagulant or antiplatelet                            agents. ASA Grade Assessment: III - A patient with                            severe systemic disease. After reviewing the risks  and benefits, the patient was deemed in                            satisfactory condition to undergo the procedure.                           After obtaining informed consent, the endoscope was                            passed under direct vision. Throughout the                            procedure, the patient's  blood pressure, pulse, and                            oxygen saturations were monitored continuously. The                            GIF-H190 ([id number]) Olympus gastroscope was                            introduced through the mouth, and advanced to the                            second part of duodenum. The upper GI endoscopy was                            accomplished without difficulty. The patient                            tolerated the procedure well. Scope In: Scope Out: Findings:      LA Grade C (one or more mucosal breaks continuous between tops of 2 or       more mucosal folds, less than 75% circumference) esophagitis was found       in the lower third of the esophagus. Biopsies were taken with a cold       forceps for histology. Verification of patient identification for the       specimen was done. Estimated blood loss was minimal.      A 6 cm hiatal hernia was present.      Clotted blood was found in the gastric fundus and in the gastric body.      The exam was otherwise without abnormality.      The cardia and gastric fundus were otherwise normal on retroflexion. Impression:               - LA Grade C esophagitis. Biopsied. I think that is                            what I saw - ? leukoplakia. It felt somehwat firm                           - 6 cm hiatal hernia.                           -  Clotted blood/dark fluid in the gastric fundus                            and in the gastric body. Limits views                           - The examination was otherwise normal. The                            retained blood/fluid did limit exam some Recommendation:           - Return patient to hospital ward for ongoing care.                           - Full liquid diet.                           - Continue present medications. bid PPI                           - Await pathology results. Could need a repeat exam                            once blood and clots clear Procedure Code(s):         --- Professional ---                           [phone], Esophagogastroduodenoscopy, flexible,                            transoral; with biopsy, single or multiple Diagnosis Code(s):        --- Professional ---                           K20.90, Esophagitis, unspecified without bleeding                           K44.9, Diaphragmatic hernia without obstruction or                            gangrene                           K92.2, Gastrointestinal hemorrhage, unspecified                           R93.3, Abnormal findings on diagnostic imaging of                            other parts of digestive tract CPT copyright 2019 [employer]. All rights reserved. The codes documented in this report are preliminary and upon coder review may  be revised to meet current compliance requirements. Gatha Mayer, MD 11/19/2019 2:50:51 PM This report has been signed electronically. Number of Addenda: 0

## 2019-11-19 NOTE — ED Notes (Signed)
The pt has not been scanned because the chemisteries are not resulted  Blood was sent at 0029  Lab looking for  The blood

## 2019-11-19 NOTE — ED Notes (Signed)
The pt wants med for sleep  And she wants to go home
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
## 2019-11-19 NOTE — ED Provider Notes (Signed)
Patient seen/examined in the Emergency Department in conjunction with Advanced Practice Provider McDonald Patient reports chest pain Exam : awake/alert, no distress.  She reports central CP, recent admit for pelvic fx Plan: workup pending, anticipate admission     Ripley Fraise, MD 11/19/19 [phone]

## 2019-11-19 NOTE — Progress Notes (Signed)
Pharmacy Antibiotic Note  Katrina Burnett is a 86 y.o. female admitted on 11/18/2019 with CP, acute leukocytosis and concern for infection.  Pharmacy has been consulted for zosyn dosing.  Plan: Zosyn 3.375g IV every 8 hours (extended infusion) Monitor renal function, clinical progression   Height: 4\' 9"  (144.8 cm) Weight: 43.1 kg (95 lb 0.3 oz) IBW/kg (Calculated) : 38.6  Temp (24hrs), Avg:98.6 F (37 C), Min:98.6 F (37 C), Max:98.6 F (37 C)  Recent Labs  Lab 11/19/19 0049 11/19/19 1019  WBC 13.4* 19.1*  CREATININE 0.88 0.87    Estimated Creatinine Clearance: 24.6 mL/min (by C-G formula based on SCr of 0.87 mg/dL).    Allergies  Allergen Reactions  . Avelox [Moxifloxacin Hcl In Nacl] Other (See Comments)    Felt bad  . Biaxin [Clarithromycin] Nausea Only  . Cefuroxime Diarrhea  . Levaquin [Levofloxacin In D5w] Diarrhea  . Minocycline Nausea And Vomiting  . Paxil [Paroxetine Hcl] Nausea And Vomiting  . Prednisone Nausea Only  . Remeron [Mirtazapine]     Doesn't remember   . Sulfur Nausea And Vomiting  . Trimox [Amoxicillin] Other (See Comments)    Doesn't remember  Has patient had a PCN reaction causing immediate rash, facial/tongue/throat swelling, SOB or lightheadedness with hypotension: Unknown Has patient had a PCN reaction causing severe rash involving mucus membranes or skin necrosis: Unknown Has patient had a PCN reaction that required hospitalization: Unknown Has patient had a PCN reaction occurring within the last 10 years: Unknown If all of the above answers are "NO", then may proceed with Cephalosporin use.  Arman Filter Hcl] Other (See Comments)    Doesn't remember     Bertis Ruddy, PharmD Clinical Pharmacist ED Pharmacist Phone # [phone] 11/19/2019 11:40 AM

## 2019-11-19 NOTE — Plan of Care (Signed)
  Problem: Activity: Goal: Risk for activity intolerance will decrease Outcome: Progressing   

## 2019-11-19 NOTE — Transfer of Care (Signed)
Immediate Anesthesia Transfer of Care Note  Patient: Katrina Burnett  Procedure(s) Performed: ESOPHAGOGASTRODUODENOSCOPY (EGD) WITH PROPOFOL (N/A ) BIOPSY  Patient Location: Endoscopy Unit  Anesthesia Type:MAC  Level of Consciousness: drowsy and patient cooperative  Airway & Oxygen Therapy: Patient Spontanous Breathing  Post-op Assessment: Report given to RN and Post -op Vital signs reviewed and stable  Post vital signs: Reviewed and stable  Last Vitals:  Vitals Value Taken Time  BP    Temp    Pulse 100 11/19/19 1435  Resp 31 11/19/19 1435  SpO2 95 % 11/19/19 1435  Vitals shown include unvalidated device data.  Last Pain:  Vitals:   11/19/19 1325  TempSrc: Oral  PainSc: 0-No pain         Complications: No complications documented.

## 2019-11-20 DIAGNOSIS — E861 Hypovolemia: Secondary | ICD-10-CM | POA: Diagnosis not present

## 2019-11-20 DIAGNOSIS — Z638 Other specified problems related to primary support group: Secondary | ICD-10-CM | POA: Diagnosis not present

## 2019-11-20 DIAGNOSIS — G8929 Other chronic pain: Secondary | ICD-10-CM | POA: Diagnosis present

## 2019-11-20 DIAGNOSIS — M19042 Primary osteoarthritis, left hand: Secondary | ICD-10-CM | POA: Diagnosis present

## 2019-11-20 DIAGNOSIS — M19041 Primary osteoarthritis, right hand: Secondary | ICD-10-CM | POA: Diagnosis present

## 2019-11-20 DIAGNOSIS — N179 Acute kidney failure, unspecified: Secondary | ICD-10-CM | POA: Diagnosis present

## 2019-11-20 DIAGNOSIS — S32000G Wedge compression fracture of unspecified lumbar vertebra, subsequent encounter for fracture with delayed healing: Secondary | ICD-10-CM | POA: Diagnosis not present

## 2019-11-20 DIAGNOSIS — M2550 Pain in unspecified joint: Secondary | ICD-10-CM | POA: Diagnosis not present

## 2019-11-20 DIAGNOSIS — M353 Polymyalgia rheumatica: Secondary | ICD-10-CM | POA: Diagnosis present

## 2019-11-20 DIAGNOSIS — K228 Other specified diseases of esophagus: Secondary | ICD-10-CM | POA: Diagnosis not present

## 2019-11-20 DIAGNOSIS — I700 Atherosclerosis of aorta: Secondary | ICD-10-CM | POA: Diagnosis not present

## 2019-11-20 DIAGNOSIS — S32434A Nondisplaced fracture of anterior column [iliopubic] of right acetabulum, initial encounter for closed fracture: Secondary | ICD-10-CM | POA: Diagnosis not present

## 2019-11-20 DIAGNOSIS — Z8616 Personal history of COVID-19: Secondary | ICD-10-CM | POA: Diagnosis not present

## 2019-11-20 DIAGNOSIS — Z7982 Long term (current) use of aspirin: Secondary | ICD-10-CM | POA: Diagnosis not present

## 2019-11-20 DIAGNOSIS — J984 Other disorders of lung: Secondary | ICD-10-CM | POA: Diagnosis not present

## 2019-11-20 DIAGNOSIS — Z62820 Parent-biological child conflict: Secondary | ICD-10-CM | POA: Diagnosis present

## 2019-11-20 DIAGNOSIS — K2270 Barrett's esophagus without dysplasia: Secondary | ICD-10-CM | POA: Diagnosis present

## 2019-11-20 DIAGNOSIS — Z79899 Other long term (current) drug therapy: Secondary | ICD-10-CM | POA: Diagnosis not present

## 2019-11-20 DIAGNOSIS — K2100 Gastro-esophageal reflux disease with esophagitis, without bleeding: Secondary | ICD-10-CM | POA: Diagnosis not present

## 2019-11-20 DIAGNOSIS — F419 Anxiety disorder, unspecified: Secondary | ICD-10-CM | POA: Diagnosis present

## 2019-11-20 DIAGNOSIS — I959 Hypotension, unspecified: Secondary | ICD-10-CM | POA: Diagnosis not present

## 2019-11-20 DIAGNOSIS — K219 Gastro-esophageal reflux disease without esophagitis: Secondary | ICD-10-CM | POA: Diagnosis not present

## 2019-11-20 DIAGNOSIS — M810 Age-related osteoporosis without current pathological fracture: Secondary | ICD-10-CM | POA: Diagnosis present

## 2019-11-20 DIAGNOSIS — D638 Anemia in other chronic diseases classified elsewhere: Secondary | ICD-10-CM | POA: Diagnosis present

## 2019-11-20 DIAGNOSIS — Z961 Presence of intraocular lens: Secondary | ICD-10-CM | POA: Diagnosis present

## 2019-11-20 DIAGNOSIS — J9811 Atelectasis: Secondary | ICD-10-CM | POA: Diagnosis not present

## 2019-11-20 DIAGNOSIS — K921 Melena: Secondary | ICD-10-CM | POA: Diagnosis not present

## 2019-11-20 DIAGNOSIS — Z7401 Bed confinement status: Secondary | ICD-10-CM | POA: Diagnosis not present

## 2019-11-20 DIAGNOSIS — K449 Diaphragmatic hernia without obstruction or gangrene: Secondary | ICD-10-CM | POA: Diagnosis present

## 2019-11-20 DIAGNOSIS — E876 Hypokalemia: Secondary | ICD-10-CM | POA: Diagnosis not present

## 2019-11-20 DIAGNOSIS — Z66 Do not resuscitate: Secondary | ICD-10-CM | POA: Diagnosis present

## 2019-11-20 DIAGNOSIS — M4856XA Collapsed vertebra, not elsewhere classified, lumbar region, initial encounter for fracture: Secondary | ICD-10-CM | POA: Diagnosis present

## 2019-11-20 DIAGNOSIS — S32591A Other specified fracture of right pubis, initial encounter for closed fracture: Secondary | ICD-10-CM | POA: Diagnosis present

## 2019-11-20 DIAGNOSIS — W19XXXA Unspecified fall, initial encounter: Secondary | ICD-10-CM | POA: Diagnosis not present

## 2019-11-20 DIAGNOSIS — R5381 Other malaise: Secondary | ICD-10-CM | POA: Diagnosis not present

## 2019-11-20 DIAGNOSIS — F329 Major depressive disorder, single episode, unspecified: Secondary | ICD-10-CM | POA: Diagnosis present

## 2019-11-20 DIAGNOSIS — I9589 Other hypotension: Secondary | ICD-10-CM | POA: Diagnosis not present

## 2019-11-20 DIAGNOSIS — K209 Esophagitis, unspecified without bleeding: Secondary | ICD-10-CM | POA: Diagnosis not present

## 2019-11-20 DIAGNOSIS — R748 Abnormal levels of other serum enzymes: Secondary | ICD-10-CM | POA: Diagnosis present

## 2019-11-20 DIAGNOSIS — D62 Acute posthemorrhagic anemia: Secondary | ICD-10-CM | POA: Diagnosis not present

## 2019-11-20 LAB — COMPREHENSIVE METABOLIC PANEL
ALT: 15 U/L (ref 0–44)
AST: 21 U/L (ref 15–41)
Albumin: 2.9 g/dL — ABNORMAL LOW (ref 3.5–5.0)
Alkaline Phosphatase: 119 U/L (ref 38–126)
Anion gap: 13 (ref 5–15)
BUN: 29 mg/dL — ABNORMAL HIGH (ref 8–23)
CO2: 21 mmol/L — ABNORMAL LOW (ref 22–32)
Calcium: 9.1 mg/dL (ref 8.9–10.3)
Chloride: 103 mmol/L (ref 98–111)
Creatinine, Ser: 1.11 mg/dL — ABNORMAL HIGH (ref 0.44–1.00)
GFR calc Af Amer: 50 mL/min — ABNORMAL LOW (ref >60)
GFR calc non Af Amer: 43 mL/min — ABNORMAL LOW (ref >60)
Glucose, Bld: 123 mg/dL — ABNORMAL HIGH (ref 70–99)
Potassium: 3.3 mmol/L — ABNORMAL LOW (ref 3.5–5.1)
Sodium: 137 mmol/L (ref 135–145)
Total Bilirubin: 0.4 mg/dL (ref 0.3–1.2)
Total Protein: 5.8 g/dL — ABNORMAL LOW (ref 6.5–8.1)

## 2019-11-20 LAB — CBC WITH DIFFERENTIAL/PLATELET
Abs Immature Granulocytes: 0.12 10*3/uL — ABNORMAL HIGH (ref 0.00–0.07)
Basophils Absolute: 0.1 10*3/uL (ref 0.0–0.1)
Basophils Relative: 1 %
Eosinophils Absolute: 0.2 10*3/uL (ref 0.0–0.5)
Eosinophils Relative: 2 %
HCT: 27.8 % — ABNORMAL LOW (ref 36.0–46.0)
Hemoglobin: 8.4 g/dL — ABNORMAL LOW (ref 12.0–15.0)
Immature Granulocytes: 1 %
Lymphocytes Relative: 14 %
Lymphs Abs: 1.8 10*3/uL (ref 0.7–4.0)
MCH: 27.9 pg (ref 26.0–34.0)
MCHC: 30.2 g/dL (ref 30.0–36.0)
MCV: 92.4 fL (ref 80.0–100.0)
Monocytes Absolute: 0.6 10*3/uL (ref 0.1–1.0)
Monocytes Relative: 5 %
Neutro Abs: 9.9 10*3/uL — ABNORMAL HIGH (ref 1.7–7.7)
Neutrophils Relative %: 77 %
Platelets: 578 10*3/uL — ABNORMAL HIGH (ref 150–400)
RBC: 3.01 MIL/uL — ABNORMAL LOW (ref 3.87–5.11)
RDW: 15.9 % — ABNORMAL HIGH (ref 11.5–15.5)
WBC: 12.7 10*3/uL — ABNORMAL HIGH (ref 4.0–10.5)
nRBC: 0 % (ref 0.0–0.2)

## 2019-11-20 LAB — CEA: CEA: 2.4 ng/mL (ref 0.0–4.7)

## 2019-11-20 LAB — SURGICAL PATHOLOGY

## 2019-11-20 NOTE — Progress Notes (Signed)
 Occupational Therapy Evaluation Patient Details Name: Katrina Burnett MRN: [id number] DOB: 03-31-1925 Today's Date: 11/20/2019    History of Present Illness 86 y.o. female admitted 7/11 with chest pain particularly with eating with esophageal mass s/p EGD 7/12. Pt with fall 11/11/19 with right pelvic fx treated non operatively. PMHx: anxiety and depression, arthritis, Barrett's esophagus, GERD, hiatal hernia, lumbar compression fracture L1-L2, osteoporosis, diverticulosis   Clinical Impression   PTA, pt lives with son and reports Modified Independence with mobility and ADLs using RW. Pt reports having increased difficulty completing daily tasks at home since recent hip fx. Pt presents now with deficits in strength, endurance, dynamic standing balance and intermittent hip pain that impairs ability to complete ADLs without assistance. Pt requires Min A at most for UB/LB ADLs and mobility assessed on OT eval. Pt unsure of who she will discharge home with at this time, but if 24/7 supervision available, pt is an appropriate HHOT candidate. Plan to further address activity tolerance and safety with ADLs at acute level.     Follow Up Recommendations  SNF (HHOT if 24/7 supervision able to be provided )    Equipment Recommendations  3 in 1 bedside commode    Recommendations for Other Services       Precautions / Restrictions Precautions Precautions: Fall Restrictions Weight Bearing Restrictions: No Other Position/Activity Restrictions: WBAT per ortho MD      Mobility Bed Mobility Overal bed mobility: Needs Assistance Bed Mobility: Supine to Sit     Supine to sit: Min guard     General bed mobility comments: min guard for safe advancement of B LE  Transfers Overall transfer level: Needs assistance Equipment used: Rolling walker (2 wheeled) Transfers: Sit to/from [facility] Sit to Stand: Min assist Stand pivot transfers: Min assist       General transfer comment: Pt  required Min A to stand from low toilet (placed BSC over toilet for increased ease for future toileting tasks). Pt min guard for sit to stand at bedside with intermittent cues required for hand placement. Pt varied between min guard and Min A for stand pivot requiring OT assist to [facility] RW safely.     Balance Overall balance assessment: Needs assistance;History of Falls Sitting-balance support: No upper extremity supported;Feet supported Sitting balance-Leahy Scale: Fair     Standing balance support: Single extremity supported;During functional activity Standing balance-Leahy Scale: Poor Standing balance comment: Pt fair static balance with B UE on RW. When only one hand support, pt balance Poor, reliant on B UE support                           ADL either performed or assessed with clinical judgement   ADL Overall ADL's : Needs assistance/impaired Eating/Feeding: Independent;Sitting   Grooming: Min guard;Standing;Wash/dry hands Grooming Details (indicate cue type and reason): min guard for hand hygiene standing at sink with RW to maintain stability, close progression to supervision Upper Body Bathing: Sitting;Supervision/ safety   Lower Body Bathing: Minimal assistance;Sit to/from stand   Upper Body Dressing : Supervision/safety;Sitting   Lower Body Dressing: Min guard;Sit to/from stand Lower Body Dressing Details (indicate cue type and reason): min guard for LB dressing in standing. Pt able to demonstrate ability to adjust socks at bed level and sitting EOB Toilet Transfer: Min guard;Ambulation;Comfort height toilet;RW [employer] forces technical officer Details (indicate cue type and reason): min guard for mobility to bathroom in room using RW, physical assistance needed to maintain dynamic  standing balance Toileting- Clothing Manipulation and Hygiene: Min guard;Sit to/from stand Toileting - Clothing Manipulation Details (indicate cue type and reason): min guard for stability in standing  with clothing mgmt     Functional mobility during ADLs: Min guard;Rolling walker General ADL Comments: Pt with decreased dynamic standing balance, low endurance, decreased strength, and intermittent pain from recent hip fx impair pt's ability to complete ADLs without assistance.     Vision Baseline Vision/History: Wears glasses Wears Glasses: Reading only Patient Visual Report: No change from baseline Vision Assessment?: No apparent visual deficits     Perception     Praxis      Pertinent Vitals/Pain Pain Assessment: Faces Faces Pain Scale: Hurts little more Pain Location: pelvic region Pain Descriptors / Indicators: Sore Pain Intervention(s): Limited activity within patient's tolerance;Monitored during session     Hand Dominance Right   Extremity/Trunk Assessment Upper Extremity Assessment Upper Extremity Assessment: Generalized weakness   Lower Extremity Assessment Lower Extremity Assessment: Defer to PT evaluation   Cervical / Trunk Assessment Cervical / Trunk Assessment: Normal   Communication Communication Communication: No difficulties   Cognition Arousal/Alertness: Awake/alert Behavior During Therapy: WFL for tasks assessed/performed Overall Cognitive Status: Impaired/Different from baseline Area of Impairment: Safety/judgement                         Safety/Judgement: Decreased awareness of safety     General Comments: Pt A&Ox4, but with decreased awareness of safety regarding DC home (reports not planning to go home with son, but unsure of who will stay with her, declined Home Instead services on the phone during session, etc)   General Comments  Pt reporting SOB after mobility back from bathroom with RW, HR up to 118bpm during activity.     Exercises     Shoulder Instructions      Home Living Family/patient expects to be discharged to:: Private residence Living Arrangements: Children Available Help at Discharge: Family Type of Home:  House Home Access: Stairs to enter Technical  of Steps: 3-4 Entrance Stairs-Rails: Can reach both;Left;Right Home Layout: One level     Bathroom Shower/Tub: Walk-in shower;Tub/shower unit   Bathroom Toilet: Standard Bathroom Accessibility: Yes How Accessible: Accessible via walker Home Equipment: Walker - 2 wheels;Grab bars - tub/shower;Shower seat          Prior Functioning/Environment Level of Independence: Independent with assistive device(s)        Comments: Pt reports Modified Independence for ADLs and mobility in the home with RW. Pt reports increasing difficulty managing at home after DC with recent pelvic fx. Pt denies any falls since the fall that resulted in hip fx.        OT Problem List: Decreased strength;Decreased activity tolerance;Impaired balance (sitting and/or standing);Decreased safety awareness;Decreased knowledge of use of DME or AE      OT Treatment/Interventions: Self-care/ADL training;Therapeutic exercise;Energy conservation;DME and/or AE instruction;Therapeutic activities;Patient/family education    OT Goals(Current goals can be found in the care plan section) Acute Rehab OT Goals Patient Stated Goal: feel better OT Goal Formulation: With patient Time For Goal Achievement: 12/04/19 Potential to Achieve Goals: Good ADL Goals Pt Will Perform Grooming: with modified independence;standing Pt Will Perform Lower Body Bathing: with modified independence;sit to/from stand Pt Will Perform Lower Body Dressing: with modified independence;sit to/from stand Pt Will Transfer to Toilet: with modified independence;ambulating;bedside commode Pt Will Perform Toileting - Clothing Manipulation and hygiene: with modified independence;sit to/from stand  OT Frequency: Min 2X/week  Barriers to D/C:            Co-evaluation              AM-PAC OT "6 Clicks" Daily Activity     Outcome Measure Help from another person eating meals?: None Help  from another person taking care of personal grooming?: A Little Help from another person toileting, which includes using toliet, bedpan, or urinal?: A Little Help from another person bathing (including washing, rinsing, drying)?: A Little Help from another person to put on and taking off regular upper body clothing?: A Little Help from another person to put on and taking off regular lower body clothing?: A Little 6 Click Score: 19   End of Session Equipment Utilized During Treatment: Gait belt;Rolling walker Nurse Communication: Mobility status  Activity Tolerance: Patient tolerated treatment well Patient left: in chair;with call bell/phone within reach;with nursing/sitter in room  OT Visit Diagnosis: Unsteadiness on feet (R26.81);Other abnormalities of gait and mobility (R26.89);Muscle weakness (generalized) (M62.81);History of falling (Z91.81)                Time: 0812-0850 OT Time Calculation (min): 38 min Charges:  OT General Charges $OT Visit: 1 Visit OT Evaluation $OT Eval Moderate Complexity: 1 Mod OT Treatments $Self Care/Home Management : 8-22 mins $Therapeutic Activity: 8-22 mins  Layla Maw, OTR/L  Layla Maw 11/20/2019, 9:18 AM
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
## 2019-11-20 NOTE — Progress Notes (Signed)
Inpatient Rehabilitation Admissions Coordinator  Inpatient rehab consult received. Patient not a candidate for CIR admit and SNF is recommended for her debility . We will sign off.  Danne Baxter, RN, MSN Rehab Admissions Coordinator [phone] 11/20/2019 6:15 PM
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
## 2019-11-21 ENCOUNTER — Encounter (HOSPITAL_COMMUNITY): Payer: Self-pay | Admitting: Internal Medicine

## 2019-11-21 DIAGNOSIS — K921 Melena: Secondary | ICD-10-CM

## 2019-11-21 DIAGNOSIS — K922 Gastrointestinal hemorrhage, unspecified: Secondary | ICD-10-CM | POA: Diagnosis not present

## 2019-11-21 DIAGNOSIS — E861 Hypovolemia: Secondary | ICD-10-CM

## 2019-11-21 DIAGNOSIS — I9589 Other hypotension: Secondary | ICD-10-CM

## 2019-11-21 DIAGNOSIS — S32000G Wedge compression fracture of unspecified lumbar vertebra, subsequent encounter for fracture with delayed healing: Secondary | ICD-10-CM

## 2019-11-21 DIAGNOSIS — S32434A Nondisplaced fracture of anterior column [iliopubic] of right acetabulum, initial encounter for closed fracture: Secondary | ICD-10-CM

## 2019-11-21 DIAGNOSIS — W19XXXA Unspecified fall, initial encounter: Secondary | ICD-10-CM

## 2019-11-21 LAB — PREPARE RBC (CROSSMATCH)

## 2019-11-21 LAB — BASIC METABOLIC PANEL
Anion gap: 10 (ref 5–15)
BUN: 20 mg/dL (ref 8–23)
CO2: 23 mmol/L (ref 22–32)
Calcium: 8.7 mg/dL — ABNORMAL LOW (ref 8.9–10.3)
Chloride: 107 mmol/L (ref 98–111)
Creatinine, Ser: 1.02 mg/dL — ABNORMAL HIGH (ref 0.44–1.00)
GFR calc Af Amer: 55 mL/min — ABNORMAL LOW (ref >60)
GFR calc non Af Amer: 47 mL/min — ABNORMAL LOW (ref >60)
Glucose, Bld: 85 mg/dL (ref 70–99)
Potassium: 4.1 mmol/L (ref 3.5–5.1)
Sodium: 140 mmol/L (ref 135–145)

## 2019-11-21 LAB — CBC
HCT: 21.9 % — ABNORMAL LOW (ref 36.0–46.0)
Hemoglobin: 6.7 g/dL — CL (ref 12.0–15.0)
MCH: 28 pg (ref 26.0–34.0)
MCHC: 30.6 g/dL (ref 30.0–36.0)
MCV: 91.6 fL (ref 80.0–100.0)
Platelets: 459 10*3/uL — ABNORMAL HIGH (ref 150–400)
RBC: 2.39 MIL/uL — ABNORMAL LOW (ref 3.87–5.11)
RDW: 15.9 % — ABNORMAL HIGH (ref 11.5–15.5)
WBC: 9.9 10*3/uL (ref 4.0–10.5)
nRBC: 0 % (ref 0.0–0.2)

## 2019-11-21 LAB — HEMOGLOBIN AND HEMATOCRIT, BLOOD
HCT: 36.4 % (ref 36.0–46.0)
Hemoglobin: 11.8 g/dL — ABNORMAL LOW (ref 12.0–15.0)

## 2019-11-21 MED ORDER — SODIUM CHLORIDE 0.9% IV SOLUTION: Freq: Once | INTRAVENOUS | Status: AC

## 2019-11-21 MED ORDER — ADULT MULTIVITAMIN W/MINERALS CH
1.0000 | ORAL_TABLET | Freq: Every day | ORAL | Status: DC
  Administered 2019-11-21 – 2019-11-23 (×3): 1 via ORAL
  Filled 2019-11-21 (×3): qty 1

## 2019-11-21 MED ORDER — ENSURE ENLIVE PO LIQD
237.0000 mL | Freq: Three times a day (TID) | ORAL | Status: DC
  Administered 2019-11-21 – 2019-11-23 (×4): 237 mL via ORAL

## 2019-11-21 MED ORDER — SODIUM CHLORIDE 0.9% IV SOLUTION: Freq: Once | INTRAVENOUS | Status: DC

## 2019-11-21 MED ORDER — WHITE PETROLATUM EX OINT
TOPICAL_OINTMENT | CUTANEOUS | Status: AC
  Administered 2019-11-21: 0.2
  Filled 2019-11-21: qty 28.35

## 2019-11-21 MED ORDER — PROSOURCE PLUS PO LIQD
30.0000 mL | Freq: Two times a day (BID) | ORAL | Status: DC
  Administered 2019-11-21 – 2019-11-23 (×4): 30 mL via ORAL
  Filled 2019-11-21 (×4): qty 30

## 2019-11-21 MED ORDER — FUROSEMIDE 10 MG/ML IJ SOLN
20.0000 mg | Freq: Once | INTRAMUSCULAR | Status: AC
  Administered 2019-11-21: 20 mg via INTRAVENOUS
  Filled 2019-11-21: qty 2

## 2019-11-21 NOTE — TOC Initial Note (Signed)
Transition of Care [facility]) - Initial/Assessment Note    Patient Details  Name: Katrina Burnett MRN: [id number] Date of Birth: May 24, 1924  Transition of Care [facility]) CM/SW Contact:    Sable Feil, LCSW Phone Number: 11/21/2019, 4:02 PM  Clinical Narrative: CSW talked with patient at the bedside regarding her discharge disposition and the recommendation of [place] rehab.  Katrina Burnett was lying in bed and was awake, alert, pleasant and agreeable to talking with CSW. Patient was given reason for visit and recommendation of ST rehab. Katrina Burnett informed CSW that she has been to [facility] before and when asked, responded that she has not been to a facility for ST rehab. Patient reported that she her son will be moving out soon and she will just be in the home for awhile, as she is moving to an apartment complex in [place] (could not remember the name of the facility). Katrina Burnett indicated that her daughter is setting it up and she plans to sell her home, as she is tired of the upkeep of a house. Patient talked about her love of flowers and that she has a greenhouse that she will miss when she moves.  When asked, patient responded that her daughter Katrina Burnett and her husband [name] (described him as "so wonderful") lives in [place] and gave permission for them to be contacted. She also provided CSW with her daughter's phone number.  Contacted daughter Katrina Burnett and talked with she and her husband regarding her mother's discharge plan. Per daughter, her mother will be going to [facility] and they purchased the [employer] and will ensure that patient has the help she needs. Per daughter, her mom will discharge home and CSW talked with her regarding her mom's need for assistance/supervision at home based on PT evaluation, to ensure her safety and also to lower the risk of falls. Daughter expressed understanding and indicated that her mom will have the help she needs at home. Discussed  home health services at patient's home until she moved and advised daughter that [facility] list will be placed in patient's room in top dresser drawer. Daughter reported that her mom has had [facility] services before.                    Expected Discharge Plan: [place] Barriers to Discharge: Continued Medical Work up   Patient Goals and CMS Choice Patient states their goals for this hospitalization and ongoing recovery are:: Patient willing to consider ST rehab and gave permission for her daughter to be contacted. Per daughter, patient will d/c home briefly and is then moving to [employer]. Living CMS Medicare.gov Compare Post Acute Care list provided to:: Other (Comment Required) (Medicare.gov home health list left in room for daughter/son-in-law) Choice offered to / list presented to : Adult Children [facility] list left in room)  Expected Discharge Plan and Services Expected Discharge Plan: [place] In-house Referral: Clinical Social Work Discharge Planning Services: Other - See comment (Patient will need HH services at discharge and the family will pay privately if needed for aide services) Post Acute Care Choice: Home Health (Per family, patient has had [facility] services before) Living arrangements for the past 2 months: Single Family Home                                      Prior  Living Arrangements/Services Living arrangements for the past 2 months: Single Family Home Lives with:: Adult Children (Patient's son did live with her, however the per patient her son will be moving out and the house will be sold) Patient language and need for interpreter reviewed:: No Do you feel safe going back to the place where you live?: Yes (Family plans to assure that someone is with patient to assure her safety)      Need for Family Participation in Patient Care: Yes (Comment) Care giver support system in place?: Yes (comment)   Criminal Activity/Legal Involvement  Pertinent to Current Situation/Hospitalization: No - Comment as needed  Activities of Daily Living Home Assistive Devices/Equipment: Walker (specify type) ADL Screening (condition at time of admission) Patient's cognitive ability adequate to safely complete daily activities?: Yes Is the patient deaf or have difficulty hearing?: No Does the patient have difficulty seeing, even when wearing glasses/contacts?: No Does the patient have difficulty concentrating, remembering, or making decisions?: No Patient able to express need for assistance with ADLs?: Yes Does the patient have difficulty dressing or bathing?: No Independently performs ADLs?: Yes (appropriate for developmental age) Does the patient have difficulty walking or climbing stairs?: Yes Weakness of Legs: Right Weakness of Arms/Hands: Left  Permission Sought/Granted Permission sought to share information with : Family Supports (Patient gave permission for her daughter and son-in-law to be contacted) Permission granted to share information with : Yes, Verbal Permission Granted  Share Information with NAME: Willeen Cass     Permission granted to share info w Relationship: Daughter  Permission granted to share info w Contact Information: [phone] (home)  Emotional Assessment Appearance:: Appears stated age Attitude/Demeanor/Rapport: Engaged Affect (typically observed): Appropriate, Pleasant Orientation: : Oriented to Self, Oriented to Place, Oriented to Situation Alcohol / Substance Use: Tobacco Use, Alcohol Use, Illicit Drugs (Per H&P, patient quit smoking and does not drink or use illicit drugs) Psych Involvement: No (comment)  Admission diagnosis:  Hypokalemia [E87.6] Thrombocytosis (HCC) [D47.3] Esophageal mass [K22.8] Chest pain, unspecified type [R07.9] Patient Active Problem List   Diagnosis Date Noted  . GIB (gastrointestinal bleeding) 11/21/2019  . Esophageal mass 11/19/2019  . Hiatal hernia   . Esophagitis    . Pelvic fracture ([place]) 11/11/2019  . Pneumothorax on left 10/24/2018  . Pneumothorax, left 10/24/2018  . Anemia 11/25/2016  . Hyponatremia 11/25/2016  . Fracture, intertrochanteric, right femur ([place]) 05/26/2015  . Closed fracture of femur ([place])   . Acute blood loss anemia   . Adjustment disorder with mixed anxiety and depressed mood   . Fall   . Abnormality of gait   . Femur fracture, right, closed, initial encounter 05/23/2015  . Fracture of femoral neck, right, closed ([place]) 05/22/2015  . Femoral neck fracture, right, closed, initial encounter 05/22/2015  . Syncope 05/30/2012  . Nausea & vomiting 05/30/2012  . Chest pain 05/30/2012  . Hypotension 05/30/2012  . Esophageal reflux 12/16/2011  . Lumbar compression fracture ([place])   . Polymyalgia ([place])    PCP:  Jani Gravel, MD Pharmacy:   [place], [place] [facility] [address] [place] [address] Phone: [phone] Fax: [phone]  [id number] - [place], [address] AT [address] [place] [address] Phone: [phone] Fax: [phone]     Social Determinants of Health (SDOH) Interventions  No SDOH interventions requested or needed at this time  Readmission Risk Interventions No flowsheet data found.
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
## 2019-11-21 NOTE — Progress Notes (Signed)
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 145 mmol/L 140 137 137  Potassium 3.5 - 5.1 mmol/L  4.1 3.3(L) 3.7  Chloride 98 - 111 mmol/L 107 103 103  CO2 22 - 32 mmol/L 23 21(L) 23  Calcium 8.9 - 10.3 mg/dL 8.7(L) 9.1 9.1  Total Protein 6.5 - 8.1 g/dL - 5.8(L) 5.8(L)  Total Bilirubin 0.3 - 1.2 mg/dL - 0.4 0.1(L)  Alkaline Phos 38 - 126 U/L - 119 125  AST 15 - 41 U/L - 21 20  ALT 0 - 44 U/L - 15 16       Micro Results Recent Results (from the past 240 hour(s))  SARS Coronavirus 2 by RT PCR (hospital order, performed in [facility] hospital lab) Nasopharyngeal Nasopharyngeal Swab     Status: None   Collection Time: 11/11/19  3:13 PM   Specimen: Nasopharyngeal Swab  Result Value Ref Range Status   SARS Coronavirus 2  NEGATIVE NEGATIVE Final    Comment: (NOTE) SARS-CoV-2 target nucleic acids are NOT DETECTED.  The SARS-CoV-2 RNA is generally detectable in upper and lower respiratory specimens during the acute phase of infection. The lowest concentration of SARS-CoV-2 viral copies this assay can detect is 250 copies / mL. A negative result does not preclude SARS-CoV-2 infection and should not be used as the sole basis for treatment or other patient management decisions.  A negative result may occur with improper specimen collection / handling, submission of specimen other than nasopharyngeal swab, presence of viral mutation(s) within the areas targeted by this assay, and inadequate number of viral copies (<250 copies / mL). A negative result must be combined with clinical observations, patient history, and epidemiological information.  Fact Sheet for Patients:   StrictlyIdeas.no  Fact Sheet for Healthcare Providers: BankingDealers.co.za  This test is not yet approved or  cleared by the [place] FDA and has been authorized for detection and/or diagnosis of SARS-CoV-2 by FDA under an Emergency Use Authorization (EUA).  This EUA will remain in effect (meaning this test can be used) for the duration of the COVID-19 declaration under Section 564(b)(1) of the Act, 21 U.S.C. section 360bbb-3(b)(1), unless the authorization is terminated or revoked sooner.  Performed at [facility], [place] [address], [place], [place] [address]   SARS Coronavirus 2 by RT PCR (hospital order, performed in [facility] hospital lab) Nasopharyngeal Nasopharyngeal Swab     Status: None   Collection Time: 11/19/19  4:55 AM   Specimen: Nasopharyngeal Swab  Result Value Ref Range Status   SARS Coronavirus 2 NEGATIVE NEGATIVE Final    Comment: (NOTE) SARS-CoV-2 target nucleic acids are NOT DETECTED.  The SARS-CoV-2 RNA is generally detectable in upper and  lower respiratory specimens during the acute phase of infection. The lowest concentration of SARS-CoV-2 viral copies this assay can detect is 250 copies / mL. A negative result does not preclude SARS-CoV-2 infection and should not be used as the sole basis for treatment or other patient management decisions.  A negative result may occur with improper specimen collection / handling, submission of specimen other than nasopharyngeal swab, presence of viral mutation(s) within the areas targeted by this assay, and inadequate number of viral copies (<250 copies / mL). A negative result must be combined with clinical observations, patient history, and epidemiological information.  Fact Sheet for Patients:   StrictlyIdeas.no  Fact Sheet for Healthcare Providers: BankingDealers.co.za  This test is not yet approved or  cleared by the [place] FDA and has been authorized for detection and/or diagnosis of SARS-CoV-2 by FDA under an Emergency Use Authorization (EUA).  This EUA will  remain in effect (meaning this test can be used) for the duration of the COVID-19 declaration under Section 564(b)(1) of the Act, 21 U.S.C. section 360bbb-3(b)(1), unless the authorization is terminated or revoked sooner.  Performed at [facility], [place] [address] [place], [place] [address]     Radiology Reports DG Chest 2 View  Result Date: 11/18/2019 CLINICAL DATA:  Chest pain. EXAM: CHEST - 2 VIEW COMPARISON:  November 11, 2019 FINDINGS: The heart size is mildly enlarged. There is no pneumothorax. No focal infiltrate. No significant pleural effusion. Multilevel vertebral body fractures are noted. The patient is status post prior multilevel vertebral augmentation IMPRESSION: No active cardiopulmonary disease. Electronically Signed   By: Constance Holster M.D.   On: 11/18/2019 23:58   CT Angio Chest PE W and/or Wo Contrast  Result Date: 11/19/2019 CLINICAL  DATA:  86 year old female with concern for pulmonary embolism. EXAM: CT ANGIOGRAPHY CHEST WITH CONTRAST TECHNIQUE: Multidetector CT imaging of the chest was performed using the standard protocol during bolus administration of intravenous contrast. Multiplanar CT image reconstructions and MIPs were obtained to evaluate the vascular anatomy. CONTRAST:  77mL OMNIPAQUE IOHEXOL 350 MG/ML SOLN COMPARISON:  Chest CT dated 10/24/2018. FINDINGS: Evaluation of this exam is limited due to respiratory motion artifact. Cardiovascular: Top-normal cardiac size. No pericardial effusion. Mild atherosclerotic calcification of the thoracic aorta. No pulmonary artery embolus identified. Mediastinum/Nodes: Mildly enlarged right hilar lymph node measures 14 mm. No mediastinal adenopathy. There is masslike thickening of the distal esophagus versus moderate size hiatal hernia. Evaluation of the esophagus is very limited. There is mild dilatation of the esophageal lumen with residual ingested content. Further evaluation with esophagram or endoscopy, if clinically indicated, recommended. No mediastinal fluid collection. Lungs/Pleura: Bibasilar linear atelectasis/scarring. Minimal right lung base subpleural dependent atelectasis. There is a 1 cm right apical subpleural scarring. No lobar consolidation or pneumothorax. The central airways are patent. Upper Abdomen: No acute abnormality. Musculoskeletal: Osteopenia with multilevel degenerative changes of the spine. Old T6 compression fracture and T8 and T9 vertebroplasties. No acute osseous pathology. Review of the MIP images confirms the above findings. IMPRESSION: 1. No acute intrathoracic pathology. No CT evidence of pulmonary artery embolus. 2. Moderate size hiatal hernia versus distal esophageal mass. Further evaluation with esophagram or endoscopy, if clinically indicated, recommended. 3. Aortic Atherosclerosis (ICD10-I70.0). Electronically Signed   By: Anner Crete M.D.   On:  11/19/2019 03:31   DG Chest Port 1 View  Result Date: 11/11/2019 CLINICAL DATA:  Right pelvic fractures, fall EXAM: PORTABLE CHEST 1 VIEW COMPARISON:  11/13/2018 FINDINGS: Stable mild cardiomegaly without CHF. No focal pneumonia. Lungs remain clear. No large effusion or pneumothorax. Trachea midline. Aorta atherosclerotic. Bones are osteopenic. Previous vertebral augmentation changes noted of the spine. IMPRESSION: Mild cardiomegaly without acute chest process.  Stable exam. Electronically Signed   By: Jerilynn Mages.  Shick M.D.   On: 11/11/2019 13:46   DG Knee Complete 4 Views Right  Result Date: 11/19/2019 CLINICAL DATA:  Right knee pain and bruising from a fall last week. EXAM: RIGHT KNEE - COMPLETE 4+ VIEW COMPARISON:  None. FINDINGS: No acute fracture, dislocation, or knee joint effusion is identified. Chondrocalcinosis is noted in the medial and lateral compartments with preserved joint space widths. IMPRESSION: No acute osseous abnormality identified.  Chondrocalcinosis. Electronically Signed   By: Logan Bores M.D.   On: 11/19/2019 06:51   DG Hip Unilat With Pelvis 2-3 Views Right  Result Date: 11/11/2019 CLINICAL DATA:  86 year old female with hip pain on  the right EXAM: DG HIP (WITH OR WITHOUT PELVIS) 2-3V RIGHT COMPARISON:  05/23/2015 FINDINGS: Osteopenia. Fracture of the right pelvis involving the acetabulum, with disruption of the iliopectineal line. Fracture of the inferior pubic ramus. Bilateral hips projects normally over the acetabula. Surgical changes of right hip fixation, with dynamic plate/screw fixation. Lucency present at the femoral head/neck screw IMPRESSION: Right pelvic fracture involving the acetabulum, as well as the right inferior pubic ramus. CT imaging is indicated. Surgical changes of prior right hip fixation, with some lucency at the femoral neck/head screw. Electronically Signed   By: Corrie Mckusick D.O.   On: 11/11/2019 13:46    SIGNED: Deatra James, MD, FACP, FHM. [facility],  Pager (please use [email or web address] to page/text)  If 7PM-7AM, please contact night-coverage [email or web address] [facility] 11/21/2019, 1:04 PM

## 2019-11-22 DIAGNOSIS — D62 Acute posthemorrhagic anemia: Secondary | ICD-10-CM

## 2019-11-22 LAB — TYPE AND SCREEN
ABO/RH(D): O POS
Antibody Screen: NEGATIVE
Unit division: 0
Unit division: 0
Unit division: 0
Unit division: 0

## 2019-11-22 LAB — CBC
HCT: 40.8 % (ref 36.0–46.0)
Hemoglobin: 13.3 g/dL (ref 12.0–15.0)
MCH: 29.3 pg (ref 26.0–34.0)
MCHC: 32.6 g/dL (ref 30.0–36.0)
MCV: 89.9 fL (ref 80.0–100.0)
Platelets: 538 10*3/uL — ABNORMAL HIGH (ref 150–400)
RBC: 4.54 MIL/uL (ref 3.87–5.11)
RDW: 15.4 % (ref 11.5–15.5)
WBC: 11.8 10*3/uL — ABNORMAL HIGH (ref 4.0–10.5)
nRBC: 0 % (ref 0.0–0.2)

## 2019-11-22 LAB — BPAM RBC
Blood Product Expiration Date: 202108122359
Blood Product Expiration Date: 202108122359
Blood Product Expiration Date: 202108122359
Blood Product Expiration Date: 202108142359
ISSUE DATE / TIME: 202107141129
ISSUE DATE / TIME: 202107141447
Unit Type and Rh: 5100
Unit Type and Rh: 5100
Unit Type and Rh: 5100
Unit Type and Rh: 5100

## 2019-11-22 LAB — BASIC METABOLIC PANEL
Anion gap: 13 (ref 5–15)
BUN: 16 mg/dL (ref 8–23)
CO2: 24 mmol/L (ref 22–32)
Calcium: 9.2 mg/dL (ref 8.9–10.3)
Chloride: 104 mmol/L (ref 98–111)
Creatinine, Ser: 1.11 mg/dL — ABNORMAL HIGH (ref 0.44–1.00)
GFR calc Af Amer: 50 mL/min — ABNORMAL LOW (ref >60)
GFR calc non Af Amer: 43 mL/min — ABNORMAL LOW (ref >60)
Glucose, Bld: 75 mg/dL (ref 70–99)
Potassium: 3.6 mmol/L (ref 3.5–5.1)
Sodium: 141 mmol/L (ref 135–145)

## 2019-11-22 LAB — OCCULT BLOOD X 1 CARD TO LAB, STOOL: Fecal Occult Bld: POSITIVE — AB

## 2019-11-22 MED ORDER — ALUM & MAG HYDROXIDE-SIMETH 200-200-20 MG/5ML PO SUSP
15.0000 mL | Freq: Four times a day (QID) | ORAL | Status: DC | PRN
  Administered 2019-11-22: 15 mL via ORAL
  Filled 2019-11-22: qty 30

## 2019-11-22 NOTE — Progress Notes (Signed)
Physical Therapy Treatment Patient Details Name: Katrina Burnett MRN: [id number] DOB: Sep 12, 1924 Today's Date: 11/22/2019    History of Present Illness Pt is a 86 y.o. female admitted 7/11 with chest pain particularly with eating with esophageal mass s/p EGD 7/12. Pt with fall 11/11/19 with right pelvic fx treated non operatively. PMHx: anxiety and depression, arthritis, Barrett's esophagus, GERD, hiatal hernia, lumbar compression fracture L1-L2, osteoporosis, diverticulosis    PT Comments    Pt limited with mobility this session secondary to fatigue and weakness. Pt reporting "I just feel so tired today!" Pt able to transfer from bed to [facility] with min guard and use of RW to urinate and have a BM. Afterwards, pt feeling too weak to attempt further mobility. Pt would continue to benefit from skilled physical therapy services at this time while admitted and after d/c to address the below listed limitations in order to improve overall safety and independence with functional mobility.    Follow Up Recommendations  Home health PT;Supervision for mobility/OOB;Other (comment) (with the goal of getting into an ALF)     Equipment Recommendations  None recommended by PT    Recommendations for Other Services       Precautions / Restrictions Precautions Precautions: Fall Restrictions Other Position/Activity Restrictions: WBAT per ortho MD    Mobility  Bed Mobility Overal bed mobility: Needs Assistance Bed Mobility: Supine to Sit;Sit to Supine     Supine to sit: Min guard Sit to supine: Min guard   General bed mobility comments: increased time and effort needed, use of bed rails, bed slightly elevated, no physical assistance needed  Transfers Overall transfer level: Needs assistance Equipment used: Rolling walker (2 wheeled) Transfers: Sit to/from [facility] Sit to Stand: Min guard Stand pivot transfers: Min guard       General transfer comment: good technique utilized,  min guard for safety with rise into standing from EOB. Upon standing up, pt felt the urge to urinate and have a BM. Min guard needed to transfer from bed to [facility] and back. Total A for pericare  Ambulation/Gait             General Gait Details: pt declined, stating she felt too weak to attempt   Stairs             Wheelchair Mobility    Modified Rankin (Stroke Patients Only)       Balance Overall balance assessment: Needs assistance;History of Falls Sitting-balance support: No upper extremity supported;Feet supported Sitting balance-Leahy Scale: Fair     Standing balance support: Bilateral upper extremity supported;Single extremity supported Standing balance-Leahy Scale: Poor                              Cognition Arousal/Alertness: Awake/alert Behavior During Therapy: WFL for tasks assessed/performed Overall Cognitive Status: Impaired/Different from baseline Area of Impairment: Memory                     Memory: Decreased short-term memory                Exercises      General Comments        Pertinent Vitals/Pain Pain Assessment: Faces Faces Pain Scale: Hurts a little bit Pain Location: R hip/groin Pain Descriptors / Indicators: Aching Pain Intervention(s): Monitored during session;Repositioned    Home Living  Prior Function            PT Goals (current goals can now be found in the care plan section) Acute Rehab PT Goals PT Goal Formulation: With patient Time For Goal Achievement: 12/04/19 Potential to Achieve Goals: Fair Progress towards PT goals: Progressing toward goals    Frequency    Min 3X/week      PT Plan Current plan remains appropriate    Co-evaluation              AM-PAC PT "6 Clicks" Mobility   Outcome Measure  Help needed turning from your back to your side while in a flat bed without using bedrails?: None Help needed moving from lying on your back to  sitting on the side of a flat bed without using bedrails?: None Help needed moving to and from a bed to a chair (including a wheelchair)?: A Little Help needed standing up from a chair using your arms (e.g., wheelchair or bedside chair)?: A Little Help needed to walk in hospital room?: A Little Help needed climbing 3-5 steps with a railing? : A Lot 6 Click Score: 19    End of Session   Activity Tolerance: Patient limited by fatigue Patient left: in bed;with call bell/phone within reach;with bed alarm set Nurse Communication: Mobility status PT Visit Diagnosis: Difficulty in walking, not elsewhere classified (R26.2);History of falling (Z91.81);Unsteadiness on feet (R26.81);Muscle weakness (generalized) (M62.81)     Time: 4270-6237 PT Time Calculation (min) (ACUTE ONLY): 19 min  Charges:  $Therapeutic Activity: 8-22 mins                     Anastasio Champion, DPT  Acute Rehabilitation Services Pager [phone] Office [place] 11/22/2019, 9:37 AM

## 2019-11-22 NOTE — Progress Notes (Signed)
Benign inflammation/ulcer Fungal elements do not warrant treatment Continue PPI and supportive care  Patient still in hospital - hospitalist will see this note and may contact me prn

## 2019-11-22 NOTE — Progress Notes (Signed)
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 history, and epidemiological information.  Fact Sheet for Patients:   StrictlyIdeas.no  Fact Sheet for Healthcare Providers: BankingDealers.co.za  This test is not yet approved or  cleared by the [place] FDA and has been authorized for detection and/or diagnosis of SARS-CoV-2 by FDA under an Emergency Use Authorization (EUA).  This EUA will remain in effect (meaning this test can be used) for the duration of the COVID-19 declaration under Section 564(b)(1) of the Act, 21 U.S.C. section 360bbb-3(b)(1), unless the authorization is terminated or revoked sooner.  Performed at [facility], [place] [address] [place], [place] [address]     Radiology Reports DG Chest 2 View  Result Date:  11/18/2019 CLINICAL DATA:  Chest pain. EXAM: CHEST - 2 VIEW COMPARISON:  November 11, 2019 FINDINGS: The heart size is mildly enlarged. There is no pneumothorax. No focal infiltrate. No significant pleural effusion. Multilevel vertebral body fractures are noted. The patient is status post prior multilevel vertebral augmentation IMPRESSION: No active cardiopulmonary disease. Electronically Signed   By: Constance Holster M.D.   On: 11/18/2019 23:58   CT Angio Chest PE W and/or Wo Contrast  Result Date: 11/19/2019 CLINICAL DATA:  86 year old female with concern for pulmonary embolism. EXAM: CT ANGIOGRAPHY CHEST WITH CONTRAST TECHNIQUE: Multidetector CT imaging of the chest was performed using the standard protocol during bolus administration of intravenous contrast. Multiplanar CT image reconstructions and MIPs were obtained to evaluate the vascular anatomy. CONTRAST:  45mL OMNIPAQUE IOHEXOL 350 MG/ML SOLN COMPARISON:  Chest CT dated 10/24/2018. FINDINGS: Evaluation of this exam is limited due to respiratory motion artifact. Cardiovascular: Top-normal cardiac size. No pericardial effusion. Mild atherosclerotic calcification of the thoracic aorta. No pulmonary artery embolus identified. Mediastinum/Nodes: Mildly enlarged right hilar lymph node measures 14 mm. No mediastinal adenopathy. There is masslike thickening of the distal esophagus versus moderate size hiatal hernia. Evaluation of the esophagus is very limited. There is mild dilatation of the esophageal lumen with residual ingested content. Further evaluation with esophagram or endoscopy, if clinically indicated, recommended. No mediastinal fluid collection. Lungs/Pleura: Bibasilar linear atelectasis/scarring. Minimal right lung base subpleural dependent atelectasis. There is a 1 cm right apical subpleural scarring. No lobar consolidation or pneumothorax. The central airways are patent. Upper Abdomen: No acute abnormality. Musculoskeletal: Osteopenia with  multilevel degenerative changes of the spine. Old T6 compression fracture and T8 and T9 vertebroplasties. No acute osseous pathology. Review of the MIP images confirms the above findings. IMPRESSION: 1. No acute intrathoracic pathology. No CT evidence of pulmonary artery embolus. 2. Moderate size hiatal hernia versus distal esophageal mass. Further evaluation with esophagram or endoscopy, if clinically indicated, recommended. 3. Aortic Atherosclerosis (ICD10-I70.0). Electronically Signed   By: Anner Crete M.D.   On: 11/19/2019 03:31   DG Chest Port 1 View  Result Date: 11/11/2019 CLINICAL DATA:  Right pelvic fractures, fall EXAM: PORTABLE CHEST 1 VIEW COMPARISON:  11/13/2018 FINDINGS: Stable mild cardiomegaly without CHF. No focal pneumonia. Lungs remain clear. No large effusion or pneumothorax. Trachea midline. Aorta atherosclerotic. Bones are osteopenic. Previous vertebral augmentation changes noted of the spine. IMPRESSION: Mild cardiomegaly without acute chest process.  Stable exam. Electronically Signed   By: Jerilynn Mages.  Shick M.D.   On: 11/11/2019 13:46   DG Knee Complete 4 Views Right  Result Date: 11/19/2019 CLINICAL DATA:  Right knee pain and bruising from a fall last week. EXAM: RIGHT KNEE - COMPLETE  4+ VIEW COMPARISON:  None. FINDINGS: No acute fracture, dislocation, or knee joint effusion is identified. Chondrocalcinosis is noted in the medial and lateral compartments with preserved joint space widths. IMPRESSION: No acute osseous abnormality identified.  Chondrocalcinosis. Electronically Signed   By: Logan Bores M.D.   On: 11/19/2019 06:51   DG Hip Unilat With Pelvis 2-3 Views Right  Result Date: 11/11/2019 CLINICAL DATA:  86 year old female with hip pain on the right EXAM: DG HIP (WITH OR WITHOUT PELVIS) 2-3V RIGHT COMPARISON:  05/23/2015 FINDINGS: Osteopenia. Fracture of the right pelvis involving the acetabulum, with disruption of the iliopectineal line. Fracture of the inferior pubic ramus.  Bilateral hips projects normally over the acetabula. Surgical changes of right hip fixation, with dynamic plate/screw fixation. Lucency present at the femoral head/neck screw IMPRESSION: Right pelvic fracture involving the acetabulum, as well as the right inferior pubic ramus. CT imaging is indicated. Surgical changes of prior right hip fixation, with some lucency at the femoral neck/head screw. Electronically Signed   By: Corrie Mckusick D.O.   On: 11/11/2019 13:46    SIGNED: Deatra James, MD, FACP, FHM. [facility],  Pager (please use [email or web address] to page/text)  If 7PM-7AM, please contact night-coverage [email or web address] 11/22/2019, 2:14 PM

## 2019-11-22 NOTE — Progress Notes (Signed)
Occupational Therapy Treatment Patient Details Name: Katrina Burnett MRN: [id number] DOB: 1924-12-12 Today's Date: 11/22/2019    History of present illness Pt is a 86 y.o. female admitted 7/11 with chest pain particularly with eating with esophageal mass s/p EGD 7/12. Pt with fall 11/11/19 with right pelvic fx treated non operatively. PMHx: anxiety and depression, arthritis, Barrett's esophagus, GERD, hiatal hernia, lumbar compression fracture L1-L2, osteoporosis, diverticulosis   OT comments  Patient supine in bed on arrival, stating she was feeling like "today is the worst day" and that she was very tired.  Agreeable to try and use BSC.  She was able to sit up with min guard and use of rails.  Stand pivot to [facility] completed with 1 hand held min assist.  Patient able to complete toileting with min guard.  Demonstraing poor activity tolerance with quick fatigue and decreased strength.  Will continue to follow with OT acutely to address the deficits listed below.    Follow Up Recommendations  SNF    Equipment Recommendations  3 in 1 bedside commode    Recommendations for Other Services      Precautions / Restrictions Precautions Precautions: Fall Restrictions Other Position/Activity Restrictions: WBAT per ortho MD       Mobility Bed Mobility Overal bed mobility: Needs Assistance Bed Mobility: Supine to Sit;Sit to Supine     Supine to sit: Min guard Sit to supine: Min guard   General bed mobility comments: increased time and effort needed, use of bed rails, bed slightly elevated, no physical assistance needed  Transfers Overall transfer level: Needs assistance Equipment used: 1 person hand held assist Transfers: Stand Pivot Transfers Sit to Stand: Min guard Stand pivot transfers: Min assist       General transfer comment: good technique utilized, min guard for safety with rise into standing from EOB. Upon standing up, pt felt the urge to urinate and have a BM. Min guard needed  to transfer from bed to [facility] and back. Total A for pericare    Balance Overall balance assessment: Needs assistance;History of Falls Sitting-balance support: No upper extremity supported;Feet supported Sitting balance-Leahy Scale: Fair     Standing balance support: Single extremity supported Standing balance-Leahy Scale: Poor                             ADL either performed or assessed with clinical judgement   ADL Overall ADL's : Needs assistance/impaired                         Toilet Transfer: Minimal assistance;Stand-pivot;BSC Toilet Transfer Details (indicate cue type and reason): One hand assist Toileting- Clothing Manipulation and Hygiene: Min guard;Sitting/lateral lean       Functional mobility during ADLs: Minimal assistance       Vision       Perception     Praxis      Cognition Arousal/Alertness: Awake/alert Behavior During Therapy: WFL for tasks assessed/performed Overall Cognitive Status: Impaired/Different from baseline Area of Impairment: Memory;Safety/judgement                     Memory: Decreased short-term memory   Safety/Judgement: Decreased awareness of safety              Exercises     Shoulder Instructions       General Comments Patient feeling very fatigued    Pertinent Vitals/ Pain  Pain Assessment: No/denies pain Faces Pain Scale: Hurts a little bit Pain Location: R hip/groin Pain Descriptors / Indicators: Aching Pain Intervention(s): Monitored during session;Repositioned  Home Living                                          Prior Functioning/Environment              Frequency  Min 2X/week        Progress Toward Goals  OT Goals(current goals can now be found in the care plan section)  Progress towards OT goals: Progressing toward goals  Acute Rehab OT Goals Patient Stated Goal: return home OT Goal Formulation: With patient Time For Goal Achievement:  12/04/19 Potential to Achieve Goals: Good  Plan Discharge plan remains appropriate    Co-evaluation                 AM-PAC OT "6 Clicks" Daily Activity     Outcome Measure   Help from another person eating meals?: None Help from another person taking care of personal grooming?: A Little Help from another person toileting, which includes using toliet, bedpan, or urinal?: A Little Help from another person bathing (including washing, rinsing, drying)?: A Little Help from another person to put on and taking off regular upper body clothing?: A Little Help from another person to put on and taking off regular lower body clothing?: A Little 6 Click Score: 19    End of Session    OT Visit Diagnosis: Unsteadiness on feet (R26.81);Other abnormalities of gait and mobility (R26.89);Muscle weakness (generalized) (M62.81);History of falling (Z91.81);Other symptoms and signs involving cognitive function   Activity Tolerance Patient tolerated treatment well   Patient Left in bed;with call bell/phone within reach;with bed alarm set   Nurse Communication Mobility status        Time: 4008-6761 OT Time Calculation (min): 17 min  Charges: OT General Charges $OT Visit: 1 Visit OT Treatments $Self Care/Home Management : 8-22 mins  August Luz, OTR/L    Phylliss Bob 11/22/2019, 12:40 PM

## 2019-11-22 NOTE — Plan of Care (Signed)
  Problem: Pain Managment: Goal: General experience of comfort will improve Outcome: Progressing   

## 2019-11-23 LAB — BASIC METABOLIC PANEL
Anion gap: 9 (ref 5–15)
BUN: 15 mg/dL (ref 8–23)
CO2: 24 mmol/L (ref 22–32)
Calcium: 9.2 mg/dL (ref 8.9–10.3)
Chloride: 106 mmol/L (ref 98–111)
Creatinine, Ser: 0.86 mg/dL (ref 0.44–1.00)
GFR calc Af Amer: >60 mL/min (ref >60)
GFR calc non Af Amer: 58 mL/min — ABNORMAL LOW (ref >60)
Glucose, Bld: 88 mg/dL (ref 70–99)
Potassium: 3.3 mmol/L — ABNORMAL LOW (ref 3.5–5.1)
Sodium: 139 mmol/L (ref 135–145)

## 2019-11-23 LAB — CBC
HCT: 38.3 % (ref 36.0–46.0)
Hemoglobin: 12.1 g/dL (ref 12.0–15.0)
MCH: 28.5 pg (ref 26.0–34.0)
MCHC: 31.6 g/dL (ref 30.0–36.0)
MCV: 90.3 fL (ref 80.0–100.0)
Platelets: 540 10*3/uL — ABNORMAL HIGH (ref 150–400)
RBC: 4.24 MIL/uL (ref 3.87–5.11)
RDW: 15.2 % (ref 11.5–15.5)
WBC: 10.2 10*3/uL (ref 4.0–10.5)
nRBC: 0 % (ref 0.0–0.2)

## 2019-11-23 MED ORDER — PANTOPRAZOLE SODIUM 40 MG PO TBEC
40.0000 mg | DELAYED_RELEASE_TABLET | Freq: Every day | ORAL | 1 refills | Status: DC
Start: 2019-11-23 — End: 2021-06-05

## 2019-11-23 MED ORDER — SUCRALFATE 1 GM/10ML PO SUSP
1.0000 g | Freq: Three times a day (TID) | ORAL | 0 refills | Status: DC | PRN
Start: 2019-11-23 — End: 2020-05-05

## 2019-11-23 NOTE — TOC Transition Note (Addendum)
Transition of Care [facility]) - CM/SW Discharge Note   Patient Details  Name: Katrina Burnett MRN: [id number] Date of Birth: 1925-01-03  [facility] CM/SW Contact:  Bethena Roys, RN Phone Number: 11/23/2019, 10:18 AM   Clinical Narrative:  Case Manager has spoken to patient's daughter and the plan is for patient to transition home with home health services with [place]. Case Manager will set up transportation with [facility] for 12:00. Son will be in the home to accept the patient. Durable Medical Equipment order to be placed in packet so the daughter can purchase from a medical supply store. No further needs from Case Manager at this time.   1100 11-23-19 Case Manager paged MD to sign the DNR and add orders for 3n1. [facility] called for pickup- scheduled for noon. No further needs from Case Manager at this time.    Final next level of care: [place] Barriers to Discharge: No Barriers Identified   Patient Goals and CMS Choice Patient states their goals for this hospitalization and ongoing recovery are:: Patient willing to consider ST rehab and gave permission for her daughter to be contacted. Per daughter, patient will d/c home briefly and is then moving to [employer]. Living CMS Medicare.gov Compare Post Acute Care list provided to:: Other (Comment Required) (Medicare.gov home health list left in room for daughter/son-in-law) Choice offered to / list presented to : Adult Children [facility] list left in room)   Discharge Plan and Services In-house Referral: Clinical Social Work Discharge Planning Services: Other - See comment (Patient will need [place] services at discharge and the family will pay privately if needed for aide services) Post Acute Care Choice: Home Health (Per family, patient has had [facility] services before)          DME Arranged: 3-N-1 DME Agency: Other - Comment (Case Manager will place orders in packet for patient's daughter to pick up at a  medical supply store.)       HH Arranged: PT, OT    Readmission Risk Interventions No flowsheet data found.

## 2019-11-23 NOTE — Progress Notes (Addendum)
DISCHARGE NOTE HOME  Katrina Burnett to be discharged Home per MD order. Discussed prescriptions and follow up appointments with the patient. Prescriptions explained to patient; medication list explained in detail. Patient verbalized understanding.  Skin clean, dry and intact without evidence of skin break down, no evidence of skin tears noted. IV catheter discontinued intact. Site without signs and symptoms of complications. Dressing and pressure applied. Pt denies pain at the site currently. No complaints noted.  Patient free of lines, drains, and wounds.   An After Visit Summary (AVS) was printed and given to the patient. Patient transferred via bed with [facility] Beatris Ship, RN

## 2019-11-23 NOTE — Care Management Important Message (Signed)
Important Message  Patient Details  Name: Katrina Burnett MRN: [id number] Date of Birth: 02/14/1925   Medicare Important Message Given:  Yes  Patient left prior to delivery of IM.  IM mailed to the patient home address.     Ein Rijo 11/23/2019, 3:07 PM

## 2019-11-23 NOTE — Care Management Important Message (Signed)
Important Message  Patient Details  Name: Katrina Burnett MRN: [id number] Date of Birth: 06/27/1924   Medicare Important Message Given:  Yes     Xian Alves Montine Circle 11/23/2019, 3:50 PM
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
## 2019-11-25 ENCOUNTER — Emergency Department (HOSPITAL_COMMUNITY)
Admission: EM | Admit: 2019-11-25 | Discharge: 2019-11-25 | Disposition: A | Discharge status: Home / Self Care | Payer: Medicare Other | Attending: Emergency Medicine | Admitting: Emergency Medicine

## 2019-11-25 ENCOUNTER — Emergency Department (HOSPITAL_COMMUNITY): Payer: Medicare Other

## 2019-11-25 DIAGNOSIS — Z7401 Bed confinement status: Secondary | ICD-10-CM | POA: Diagnosis not present

## 2019-11-25 DIAGNOSIS — R519 Headache, unspecified: Secondary | ICD-10-CM | POA: Insufficient documentation

## 2019-11-25 DIAGNOSIS — G319 Degenerative disease of nervous system, unspecified: Secondary | ICD-10-CM | POA: Diagnosis not present

## 2019-11-25 DIAGNOSIS — F419 Anxiety disorder, unspecified: Secondary | ICD-10-CM | POA: Insufficient documentation

## 2019-11-25 DIAGNOSIS — I10 Essential (primary) hypertension: Secondary | ICD-10-CM | POA: Diagnosis not present

## 2019-11-25 DIAGNOSIS — Z20822 Contact with and (suspected) exposure to covid-19: Secondary | ICD-10-CM | POA: Insufficient documentation

## 2019-11-25 DIAGNOSIS — Z87891 Personal history of nicotine dependence: Secondary | ICD-10-CM | POA: Insufficient documentation

## 2019-11-25 DIAGNOSIS — R102 Pelvic and perineal pain: Secondary | ICD-10-CM | POA: Diagnosis not present

## 2019-11-25 DIAGNOSIS — R9082 White matter disease, unspecified: Secondary | ICD-10-CM | POA: Diagnosis not present

## 2019-11-25 DIAGNOSIS — Z7982 Long term (current) use of aspirin: Secondary | ICD-10-CM | POA: Diagnosis not present

## 2019-11-25 DIAGNOSIS — R0902 Hypoxemia: Secondary | ICD-10-CM | POA: Diagnosis not present

## 2019-11-25 DIAGNOSIS — R110 Nausea: Secondary | ICD-10-CM | POA: Diagnosis not present

## 2019-11-25 DIAGNOSIS — I6782 Cerebral ischemia: Secondary | ICD-10-CM | POA: Diagnosis not present

## 2019-11-25 DIAGNOSIS — G9389 Other specified disorders of brain: Secondary | ICD-10-CM | POA: Diagnosis not present

## 2019-11-25 DIAGNOSIS — R112 Nausea with vomiting, unspecified: Secondary | ICD-10-CM | POA: Diagnosis not present

## 2019-11-25 DIAGNOSIS — R52 Pain, unspecified: Secondary | ICD-10-CM | POA: Diagnosis not present

## 2019-11-25 DIAGNOSIS — G4489 Other headache syndrome: Secondary | ICD-10-CM | POA: Diagnosis not present

## 2019-11-25 DIAGNOSIS — R1110 Vomiting, unspecified: Secondary | ICD-10-CM | POA: Diagnosis not present

## 2019-11-25 DIAGNOSIS — R509 Fever, unspecified: Secondary | ICD-10-CM | POA: Diagnosis not present

## 2019-11-25 DIAGNOSIS — R1111 Vomiting without nausea: Secondary | ICD-10-CM | POA: Diagnosis not present

## 2019-11-25 DIAGNOSIS — R58 Hemorrhage, not elsewhere classified: Secondary | ICD-10-CM | POA: Diagnosis not present

## 2019-11-25 DIAGNOSIS — M2550 Pain in unspecified joint: Secondary | ICD-10-CM | POA: Diagnosis not present

## 2019-11-25 LAB — COMPREHENSIVE METABOLIC PANEL
ALT: 15 U/L (ref 0–44)
AST: 20 U/L (ref 15–41)
Albumin: 3.2 g/dL — ABNORMAL LOW (ref 3.5–5.0)
Alkaline Phosphatase: 174 U/L — ABNORMAL HIGH (ref 38–126)
Anion gap: 11 (ref 5–15)
BUN: 21 mg/dL (ref 8–23)
CO2: 22 mmol/L (ref 22–32)
Calcium: 9.5 mg/dL (ref 8.9–10.3)
Chloride: 103 mmol/L (ref 98–111)
Creatinine, Ser: 0.82 mg/dL (ref 0.44–1.00)
GFR calc Af Amer: >60 mL/min (ref >60)
GFR calc non Af Amer: >60 mL/min (ref >60)
Glucose, Bld: 121 mg/dL — ABNORMAL HIGH (ref 70–99)
Potassium: 4 mmol/L (ref 3.5–5.1)
Sodium: 136 mmol/L (ref 135–145)
Total Bilirubin: 0.8 mg/dL (ref 0.3–1.2)
Total Protein: 6.2 g/dL — ABNORMAL LOW (ref 6.5–8.1)

## 2019-11-25 LAB — CBC WITH DIFFERENTIAL/PLATELET
Abs Immature Granulocytes: 0.08 10*3/uL — ABNORMAL HIGH (ref 0.00–0.07)
Basophils Absolute: 0.1 10*3/uL (ref 0.0–0.1)
Basophils Relative: 1 %
Eosinophils Absolute: 0 10*3/uL (ref 0.0–0.5)
Eosinophils Relative: 0 %
HCT: 39.6 % (ref 36.0–46.0)
Hemoglobin: 12.4 g/dL (ref 12.0–15.0)
Immature Granulocytes: 1 %
Lymphocytes Relative: 6 %
Lymphs Abs: 1 10*3/uL (ref 0.7–4.0)
MCH: 28.4 pg (ref 26.0–34.0)
MCHC: 31.3 g/dL (ref 30.0–36.0)
MCV: 90.6 fL (ref 80.0–100.0)
Monocytes Absolute: 0.7 10*3/uL (ref 0.1–1.0)
Monocytes Relative: 4 %
Neutro Abs: 13.4 10*3/uL — ABNORMAL HIGH (ref 1.7–7.7)
Neutrophils Relative %: 88 %
Platelets: 590 10*3/uL — ABNORMAL HIGH (ref 150–400)
RBC: 4.37 MIL/uL (ref 3.87–5.11)
RDW: 14.9 % (ref 11.5–15.5)
WBC: 15.2 10*3/uL — ABNORMAL HIGH (ref 4.0–10.5)
nRBC: 0 % (ref 0.0–0.2)

## 2019-11-25 LAB — URINALYSIS, ROUTINE W REFLEX MICROSCOPIC
Bilirubin Urine: NEGATIVE
Glucose, UA: NEGATIVE mg/dL
Hgb urine dipstick: NEGATIVE
Ketones, ur: NEGATIVE mg/dL
Leukocytes,Ua: NEGATIVE
Nitrite: NEGATIVE
Protein, ur: NEGATIVE mg/dL
Specific Gravity, Urine: 1.013 (ref 1.005–1.030)
pH: 7 (ref 5.0–8.0)

## 2019-11-25 LAB — LIPASE, BLOOD: Lipase: 23 U/L (ref 11–51)

## 2019-11-25 LAB — SARS CORONAVIRUS 2 BY RT PCR (HOSPITAL ORDER, PERFORMED IN ~~LOC~~ HOSPITAL LAB): SARS Coronavirus 2: NEGATIVE

## 2019-11-25 MED ORDER — METOCLOPRAMIDE HCL 10 MG PO TABS
10.0000 mg | ORAL_TABLET | Freq: Four times a day (QID) | ORAL | 0 refills | Status: DC | PRN
Start: 2019-11-25 — End: 2020-05-05

## 2019-11-25 MED ORDER — DIPHENHYDRAMINE HCL 50 MG/ML IJ SOLN
25.0000 mg | Freq: Once | INTRAMUSCULAR | Status: AC
  Administered 2019-11-25: 25 mg via INTRAVENOUS
  Filled 2019-11-25: qty 1

## 2019-11-25 MED ORDER — ONDANSETRON HCL 4 MG/2ML IJ SOLN
4.0000 mg | Freq: Once | INTRAMUSCULAR | Status: AC
  Administered 2019-11-25: 4 mg via INTRAVENOUS
  Filled 2019-11-25: qty 2

## 2019-11-25 MED ORDER — SODIUM CHLORIDE 0.9 % IV BOLUS
500.0000 mL | Freq: Once | INTRAVENOUS | Status: AC
  Administered 2019-11-25: 500 mL via INTRAVENOUS

## 2019-11-25 MED ORDER — FENTANYL CITRATE (PF) 100 MCG/2ML IJ SOLN
50.0000 ug | Freq: Once | INTRAMUSCULAR | Status: AC
  Administered 2019-11-25: 50 ug via INTRAVENOUS
  Filled 2019-11-25: qty 2

## 2019-11-25 MED ORDER — METOCLOPRAMIDE HCL 5 MG/ML IJ SOLN
10.0000 mg | Freq: Once | INTRAMUSCULAR | Status: AC
  Administered 2019-11-25: 10 mg via INTRAVENOUS
  Filled 2019-11-25: qty 2

## 2019-11-25 NOTE — ED Notes (Signed)
Called [facility] for transport.  

## 2019-11-25 NOTE — ED Notes (Signed)
Patient verbalizes understanding of discharge instructions. Opportunity for questioning and answers were provided. Armband removed by staff, pt discharged from ED to home via [facility]

## 2019-11-25 NOTE — ED Provider Notes (Signed)
[facility] EMERGENCY DEPARTMENT Provider Note   CSN: [id number] Arrival date & time: 11/25/19  1036     History Chief Complaint  Patient presents with  . Nausea  . Emesis    Katrina Burnett is a 86 y.o. female.  Patient is a 86 year old female with a history of Barrett's esophagus, GERD, anxiety, recent pelvic fracture who presents with headache associated with nausea vomiting.  She was recently admitted and discharged 2 days ago from this hospital.  She was admitted for chest pain.  On CT scan she had possible esophageal mass versus large hiatal hernia.  She was followed by gastroenterology and had an EGD with biopsy, results were pending at time of discharge.  She did have a drop in her hemoglobin down to 6.7 and was transfused 2 units of packed red cells.  She said that since she has been home she has had a gradually worsening headache.  She has not been able to sleep.  She feels like she was discharged too soon.  She has had some associated nausea and vomiting.  She denies any abdominal pain.  She does have some pain in her pelvis resulting from her recent pelvic fracture.  She denies any new falls.  No urinary symptoms.  No cough or cold symptoms.  She had a negative Covid test on her recent admission.  She describes the headache as a throbbing pain all over her head.  She denies any similar headaches in the past.  She denies any recent head trauma.  She said she had some diarrhea while she was in the hospital but it has not continued at home.        Past Medical History:  Diagnosis Date  . Allergic rhinitis   . Anxiety   . Arthritis    "hands" (11/25/2016)  . Barrett's esophagus 06/1997  . CAP (community acquired pneumonia) 11/25/2016   "this is my 3rd time having pneumonia" (11/25/2016)  . Chondromalacia   . Chronic lower back pain   . Depression   . Diverticulosis   . GERD (gastroesophageal reflux disease)   . Glaucoma   . History of hiatal hernia   .  History of stomach ulcers   . Lumbar compression fracture (HCC)    L1 and L2  . Osteoporosis   . Pneumonia   . Polymyalgia ([place])   . Stroke ([place])   . Tubular adenoma of colon 06/1999  . Vaginitis   . Varicose veins     Patient Active Problem List   Diagnosis Date Noted  . GIB (gastrointestinal bleeding) 11/21/2019  . Esophageal mass 11/19/2019  . Hiatal hernia   . Esophagitis   . Pelvic fracture ([place]) 11/11/2019  . Pneumothorax on left 10/24/2018  . Pneumothorax, left 10/24/2018  . Anemia 11/25/2016  . Hyponatremia 11/25/2016  . Fracture, intertrochanteric, right femur ([place]) 05/26/2015  . Closed fracture of femur ([place])   . Acute blood loss anemia   . Adjustment disorder with mixed anxiety and depressed mood   . Fall   . Abnormality of gait   . Femur fracture, right, closed, initial encounter 05/23/2015  . Fracture of femoral neck, right, closed ([place]) 05/22/2015  . Femoral neck fracture, right, closed, initial encounter 05/22/2015  . Syncope 05/30/2012  . Nausea & vomiting 05/30/2012  . Chest pain 05/30/2012  . Hypotension 05/30/2012  . Esophageal reflux 12/16/2011  . Lumbar compression fracture ([place])   . Polymyalgia ([place])     Past Surgical History:  Procedure Laterality Date  . BACK SURGERY    . BIOPSY  11/19/2019   Procedure: BIOPSY;  Surgeon: Gatha Mayer, MD;  Location: [facility];  Service: Endoscopy;;  . CATARACT EXTRACTION W/ INTRAOCULAR LENS  IMPLANT, BILATERAL Bilateral   . ESOPHAGOGASTRODUODENOSCOPY (EGD) WITH PROPOFOL N/A 11/19/2019   Procedure: ESOPHAGOGASTRODUODENOSCOPY (EGD) WITH PROPOFOL;  Surgeon: Gatha Mayer, MD;  Location: [facility];  Service: Endoscopy;  Laterality: N/A;  . FIXATION KYPHOPLASTY LUMBAR SPINE  10/12/10; 09/20/11  . FRACTURE SURGERY    . IR KYPHO THORACIC WITH BONE BIOPSY  03/24/2018  . IR VERTEBROPLASTY CERV/THOR BX INC UNI/BIL INC/INJECT/IMAGING  03/24/2018  . LUMBAR SPINE SURGERY     "did OR on 2 fractures in my lower  back"  . LUNG BIOPSY    . ORIF HIP FRACTURE Right 05/23/2015   Procedure: OPEN REDUCTION INTERNAL FIXATION HIP;  Surgeon: Frederik Pear, MD;  Location: [facility];  Service: Orthopedics;  Laterality: Right;  Marland Kitchen VARICOSE VEIN SURGERY     vein ligation and stripping "years ago"     OB History    Gravida  4   Para  4   Term  4   Preterm      AB      Living  4     SAB      TAB      Ectopic      Multiple      Live Births              Family History  Problem Relation Age of Onset  . Colon cancer Mother 58  . Congestive Heart Failure Mother   . Heart disease Father   . Alcohol abuse Father   . Heart disease Maternal Grandmother   . Fibromyalgia Son     Social History   Tobacco Use  . Smoking status: Former Smoker    Years: 1.00    Types: Cigarettes  . Smokeless tobacco: Never Used  . Tobacco comment: At age 57 yrs old but nothing since   Vaping Use  . Vaping Use: Never used  Substance Use Topics  . Alcohol use: No  . Drug use: No    Home Medications Prior to Admission medications   Medication Sig Start Date End Date Taking? Authorizing Provider  acetaminophen (TYLENOL) 500 MG tablet Take 500-1,000 mg by mouth every 6 (six) hours as needed for moderate pain.    [provider]  aspirin EC 81 MG tablet Take 81 mg by mouth daily. Swallow whole.    [provider]  cholecalciferol (VITAMIN D) 1000 UNITS tablet Take 1,000 Units by mouth at bedtime.     [provider]  dorzolamide (TRUSOPT) 2 % ophthalmic solution Place 1 drop into both eyes 2 (two) times daily.     [provider]  latanoprost (XALATAN) 0.005 % ophthalmic solution Place 1 drop into both eyes at bedtime.    [provider]  Multiple Vitamin (MULTIVITAMIN) tablet Take 1 tablet by mouth at bedtime.     [provider]  oxyCODONE-acetaminophen (PERCOCET) 5-325 MG tablet Take 1-2 tablets by mouth every 6 (six) hours as needed. Patient taking  differently: Take 1 tablet by mouth every 6 (six) hours as needed for moderate pain or severe pain.  03/02/18   Milton Ferguson, MD  pantoprazole (PROTONIX) 40 MG tablet Take 1 tablet (40 mg total) by mouth daily. 11/23/19 12/23/19  Shahmehdi, Valeria Batman, MD  polyethylene glycol (MIRALAX / GLYCOLAX) 17 g  packet Take 17 g by mouth daily as needed for mild constipation. 11/12/19   Raiford Noble Latif, DO  Probiotic Product (ALIGN) 4 MG CAPS Take 4 mg by mouth at bedtime.     [provider]  sucralfate (CARAFATE) 1 GM/10ML suspension Take 10 mLs (1 g total) by mouth 3 (three) times daily as needed. 11/23/19 12/23/19  ShahmehdiValeria Batman, MD  traZODone (DESYREL) 50 MG tablet Take 0.5-1 tablets (25-50 mg total) by mouth at bedtime as needed for sleep. Patient taking differently: Take 50 mg by mouth at bedtime as needed for sleep.  10/28/18   [name], MD    Allergies    Avelox [moxifloxacin hcl in nacl], Biaxin [clarithromycin], Cefuroxime, Levaquin [levofloxacin in d5w], Minocycline, Paxil [paroxetine hcl], Prednisone, Remeron [mirtazapine], Sulfur, Trimox [amoxicillin], and Viibryd [vilazodone hcl]  Review of Systems   Review of Systems  Constitutional: Positive for fatigue. Negative for chills, diaphoresis and fever.  HENT: Negative for congestion, rhinorrhea and sneezing.   Eyes: Negative.   Respiratory: Negative for cough, chest tightness and shortness of breath.   Cardiovascular: Negative for chest pain and leg swelling.  Gastrointestinal: Positive for nausea and vomiting. Negative for abdominal pain, blood in stool and diarrhea.  Genitourinary: Positive for pelvic pain (, Pelvic fracture). Negative for difficulty urinating, flank pain, frequency and hematuria.  Musculoskeletal: Negative for arthralgias and back pain.  Skin: Negative for rash.  Neurological: Positive for headaches. Negative for dizziness, speech difficulty, weakness and numbness.    Physical Exam Updated Vital Signs BP  135/76   Pulse 95   Temp 99.5 F (37.5 C) (Rectal)   Resp (!) 24   Ht 4\' 9"  (1.448 m)   Wt 40 kg   SpO2 95%   BMI 19.08 kg/m   Physical Exam Constitutional:      Appearance: She is well-developed.  HENT:     Head: Normocephalic and atraumatic.  Eyes:     Pupils: Pupils are equal, round, and reactive to light.  Cardiovascular:     Rate and Rhythm: Normal rate and regular rhythm.     Heart sounds: Normal heart sounds.  Pulmonary:     Effort: Pulmonary effort is normal. No respiratory distress.     Breath sounds: Normal breath sounds. No wheezing or rales.  Chest:     Chest wall: No tenderness.  Abdominal:     General: Bowel sounds are normal.     Palpations: Abdomen is soft.     Tenderness: There is no abdominal tenderness. There is no guarding or rebound.  Musculoskeletal:        General: Normal range of motion.     Cervical back: Normal range of motion and neck supple.  Lymphadenopathy:     Cervical: No cervical adenopathy.  Skin:    General: Skin is warm and dry.     Findings: No rash.  Neurological:     General: No focal deficit present.     Mental Status: She is alert and oriented to person, place, and time.     Comments: Motor 5/5 all extremities Sensation grossly intact to LT all extremities Finger to Nose intact, no pronator drift CN II-XII grossly intact       ED Results / Procedures / Treatments   Labs (all labs ordered are listed, but only abnormal results are displayed) Labs Reviewed  COMPREHENSIVE METABOLIC PANEL - Abnormal; Notable for the following components:      Result Value   Glucose, Bld 121 (*)  Total Protein 6.2 (*)    Albumin 3.2 (*)    Alkaline Phosphatase 174 (*)    All other components within normal limits  CBC WITH DIFFERENTIAL/PLATELET - Abnormal; Notable for the following components:   WBC 15.2 (*)    Platelets 590 (*)    Neutro Abs 13.4 (*)    Abs Immature Granulocytes 0.08 (*)    All other components within normal limits   URINALYSIS, ROUTINE W REFLEX MICROSCOPIC - Abnormal; Notable for the following components:   APPearance HAZY (*)    All other components within normal limits  SARS CORONAVIRUS 2 BY RT PCR (HOSPITAL ORDER, [facility])  LIPASE, BLOOD    EKG None  Radiology DG Chest 2 View  Result Date: 11/25/2019 CLINICAL DATA:  Vomiting and fever EXAM: CHEST - 2 VIEW COMPARISON:  11/18/2019 FINDINGS: No new consolidation or edema. No pleural effusion. Stable cardiomediastinal contours. No acute osseous abnormality with multilevel vertebroplasties again identified. IMPRESSION: No acute process in the chest. Electronically Signed   By: Macy Mis M.D.   On: 11/25/2019 14:05   CT Head Wo Contrast  Result Date: 11/25/2019 CLINICAL DATA:  Headache and vomiting for the past 2 days. EXAM: CT HEAD WITHOUT CONTRAST TECHNIQUE: Contiguous axial images were obtained from the base of the skull through the vertex without intravenous contrast. COMPARISON:  05/30/2012 FINDINGS: Brain: Moderate cerebral and cerebellar atrophy with mild progression. Mild-to-moderate patchy white matter low density in both cerebral hemispheres with progression. No intracranial hemorrhage, mass lesion or CT evidence of acute infarction. Vascular: No hyperdense vessel or unexpected calcification. Skull: Normal. Negative for fracture or focal lesion. Sinuses/Orbits: Status post bilateral cataract extraction. Unremarkable bones and included paranasal sinuses. Other: None. IMPRESSION: 1. No acute abnormality. 2. Moderate diffuse cerebral and cerebellar atrophy with mild progression. 3. Mild to moderate chronic small vessel white matter ischemic changes in both cerebral hemispheres with progression. Electronically Signed   By: Claudie Revering M.D.   On: 11/25/2019 12:35    Procedures Procedures (including critical care time)  Medications Ordered in ED Medications  fentaNYL (SUBLIMAZE) injection 50 mcg (50 mcg Intravenous  Given 11/25/19 1133)  ondansetron (ZOFRAN) injection 4 mg (4 mg Intravenous Given 11/25/19 1135)  sodium chloride 0.9 % bolus 500 mL (0 mLs Intravenous Stopped 11/25/19 1422)  sodium chloride 0.9 % bolus 500 mL (500 mLs Intravenous New Bag/Given 11/25/19 1419)  metoCLOPramide (REGLAN) injection 10 mg (10 mg Intravenous Given 11/25/19 1418)  diphenhydrAMINE (BENADRYL) injection 25 mg (25 mg Intravenous Given 11/25/19 1418)    ED Course  I have reviewed the triage vital signs and the nursing notes.  Pertinent labs & imaging results that were available during my care of the patient were reviewed by me and considered in my medical decision making (see chart for details).    MDM Rules/Calculators/A&P                          Patient is a 86 year old female who presents with a headache.  It is a diffuse headache.  There is no thunderclap component.  She does not have any neck pain or other suggestions of subarachnoid hemorrhage or meningitis.  She has an normal rectal temp. her white count is mildly elevated but she does not have any other suggestions of infection.  She does not have any meningeal signs.  There is no evidence of pneumonia on x-ray.  Her urine does not appear to be  infected.  She has had some associated vomiting but she has no abdominal pain.  Her head CT shows no acute abnormality.  She has no neurologic deficits.  She is awake and alert and talking.  She did not feel that the fentanyl that initially was given for headache helped at all.  She seems to be quite anxious and her main complaint is that she has not been able to sleep.  Not sure if this is from the headache or has an anxiety component.  We will try a migraine cocktail.  If improved and she is able to tolerate oral fluids, can likely be discharged.  If she does not have improvement, may need mission for observation.  Her daughter says that they do have home hospice that should be contacting them tomorrow about setting up services.   Dr. Billy Fischer to take over care pending reassessment after treatment. Final Clinical Impression(s) / ED Diagnoses Final diagnoses:  Nonintractable headache, unspecified chronicity pattern, unspecified headache type  Anxiety    Rx / DC Orders ED Discharge Orders    None       Malvin Johns, MD 11/25/19 1530

## 2019-11-25 NOTE — ED Triage Notes (Signed)
Pt arrives via EMS from home with complaints of N/V X2 days with increased weakness. Pt alert and oriented X4. EMS reports fever of 100.0. Pt states she has received both covid vaccines.   Temp 100.0 163/100 P 103 RR 16 unlabored CBG 119 95% RA

## 2019-11-25 NOTE — Progress Notes (Signed)
[facility]  Ms. Katrina Burnett was being served by home health.  Home health RN called hospice this am to refer pt to hospice, pt was subsequently admitted to the ED.  Spoke with daughter and son-in-law, they endorse that felt she was d/c's home too soon and needed IV fluids etc. Attempted to elicit GOC, but family was set on her being medically optimized prior to discussing hospice.  [facility] will follow while in the hospital and assist with d/c planning when the time is appropriate.  Katrina Carbon RN, BSN, [facility] Liaison

## 2019-11-30 DIAGNOSIS — K219 Gastro-esophageal reflux disease without esophagitis: Secondary | ICD-10-CM | POA: Diagnosis not present

## 2019-11-30 DIAGNOSIS — S32431D Displaced fracture of anterior column [iliopubic] of right acetabulum, subsequent encounter for fracture with routine healing: Secondary | ICD-10-CM | POA: Diagnosis not present

## 2019-11-30 DIAGNOSIS — G4700 Insomnia, unspecified: Secondary | ICD-10-CM | POA: Diagnosis not present

## 2019-11-30 DIAGNOSIS — G8929 Other chronic pain: Secondary | ICD-10-CM | POA: Diagnosis not present

## 2019-11-30 DIAGNOSIS — S329XXA Fracture of unspecified parts of lumbosacral spine and pelvis, initial encounter for closed fracture: Secondary | ICD-10-CM | POA: Diagnosis not present

## 2019-11-30 DIAGNOSIS — Z09 Encounter for follow-up examination after completed treatment for conditions other than malignant neoplasm: Secondary | ICD-10-CM | POA: Diagnosis not present

## 2019-11-30 DIAGNOSIS — M791 Myalgia, unspecified site: Secondary | ICD-10-CM | POA: Diagnosis not present

## 2019-11-30 DIAGNOSIS — F329 Major depressive disorder, single episode, unspecified: Secondary | ICD-10-CM | POA: Diagnosis not present

## 2019-11-30 DIAGNOSIS — F419 Anxiety disorder, unspecified: Secondary | ICD-10-CM | POA: Diagnosis not present

## 2019-11-30 DIAGNOSIS — K449 Diaphragmatic hernia without obstruction or gangrene: Secondary | ICD-10-CM | POA: Diagnosis not present

## 2019-12-01 ENCOUNTER — Emergency Department (HOSPITAL_BASED_OUTPATIENT_CLINIC_OR_DEPARTMENT_OTHER)
Admission: EM | Admit: 2019-12-01 | Discharge: 2019-12-02 | Disposition: A | Discharge status: Home / Self Care | Payer: Medicare Other | Attending: Emergency Medicine | Admitting: Emergency Medicine

## 2019-12-01 ENCOUNTER — Other Ambulatory Visit: Payer: Self-pay

## 2019-12-01 ENCOUNTER — Encounter (HOSPITAL_BASED_OUTPATIENT_CLINIC_OR_DEPARTMENT_OTHER): Payer: Self-pay | Admitting: Emergency Medicine

## 2019-12-01 DIAGNOSIS — M25551 Pain in right hip: Secondary | ICD-10-CM | POA: Diagnosis not present

## 2019-12-01 DIAGNOSIS — N3000 Acute cystitis without hematuria: Secondary | ICD-10-CM

## 2019-12-01 DIAGNOSIS — M25572 Pain in left ankle and joints of left foot: Secondary | ICD-10-CM | POA: Diagnosis not present

## 2019-12-01 DIAGNOSIS — R300 Dysuria: Secondary | ICD-10-CM | POA: Diagnosis not present

## 2019-12-01 DIAGNOSIS — Z87891 Personal history of nicotine dependence: Secondary | ICD-10-CM | POA: Insufficient documentation

## 2019-12-01 DIAGNOSIS — E876 Hypokalemia: Secondary | ICD-10-CM | POA: Insufficient documentation

## 2019-12-01 DIAGNOSIS — R350 Frequency of micturition: Secondary | ICD-10-CM | POA: Diagnosis present

## 2019-12-01 DIAGNOSIS — Z7982 Long term (current) use of aspirin: Secondary | ICD-10-CM | POA: Diagnosis not present

## 2019-12-01 LAB — CBC WITH DIFFERENTIAL/PLATELET
Abs Immature Granulocytes: 0.1 10*3/uL — ABNORMAL HIGH (ref 0.00–0.07)
Basophils Absolute: 0 10*3/uL (ref 0.0–0.1)
Basophils Relative: 0 %
Eosinophils Absolute: 0 10*3/uL (ref 0.0–0.5)
Eosinophils Relative: 0 %
HCT: 32.8 % — ABNORMAL LOW (ref 36.0–46.0)
Hemoglobin: 10.6 g/dL — ABNORMAL LOW (ref 12.0–15.0)
Immature Granulocytes: 1 %
Lymphocytes Relative: 4 %
Lymphs Abs: 0.6 10*3/uL — ABNORMAL LOW (ref 0.7–4.0)
MCH: 29 pg (ref 26.0–34.0)
MCHC: 32.3 g/dL (ref 30.0–36.0)
MCV: 89.6 fL (ref 80.0–100.0)
Monocytes Absolute: 1 10*3/uL (ref 0.1–1.0)
Monocytes Relative: 7 %
Neutro Abs: 14.3 10*3/uL — ABNORMAL HIGH (ref 1.7–7.7)
Neutrophils Relative %: 88 %
Platelets: 410 10*3/uL — ABNORMAL HIGH (ref 150–400)
RBC: 3.66 MIL/uL — ABNORMAL LOW (ref 3.87–5.11)
RDW: 14 % (ref 11.5–15.5)
WBC: 16.1 10*3/uL — ABNORMAL HIGH (ref 4.0–10.5)
nRBC: 0 % (ref 0.0–0.2)

## 2019-12-01 LAB — URINALYSIS, COMPLETE (UACMP) WITH MICROSCOPIC
Bilirubin Urine: NEGATIVE
Glucose, UA: NEGATIVE mg/dL
Ketones, ur: NEGATIVE mg/dL
Nitrite: POSITIVE — AB
Protein, ur: 30 mg/dL — AB
Specific Gravity, Urine: 1.015 (ref 1.005–1.030)
WBC, UA: >50 WBC/hpf (ref 0–5)
pH: 6 (ref 5.0–8.0)

## 2019-12-01 LAB — BASIC METABOLIC PANEL
Anion gap: 10 (ref 5–15)
BUN: 18 mg/dL (ref 8–23)
CO2: 22 mmol/L (ref 22–32)
Calcium: 8.6 mg/dL — ABNORMAL LOW (ref 8.9–10.3)
Chloride: 102 mmol/L (ref 98–111)
Creatinine, Ser: 0.89 mg/dL (ref 0.44–1.00)
GFR calc Af Amer: >60 mL/min (ref >60)
GFR calc non Af Amer: 56 mL/min — ABNORMAL LOW (ref >60)
Glucose, Bld: 122 mg/dL — ABNORMAL HIGH (ref 70–99)
Potassium: 2.7 mmol/L — CL (ref 3.5–5.1)
Sodium: 134 mmol/L — ABNORMAL LOW (ref 135–145)

## 2019-12-01 MED ORDER — CEPHALEXIN 250 MG PO CAPS
250.0000 mg | ORAL_CAPSULE | Freq: Two times a day (BID) | ORAL | 0 refills | Status: AC
Start: 2019-12-01 — End: 2019-12-08

## 2019-12-01 MED ORDER — ACETAMINOPHEN 325 MG PO TABS
650.0000 mg | ORAL_TABLET | Freq: Once | ORAL | Status: AC
  Administered 2019-12-01: 650 mg via ORAL
  Filled 2019-12-01: qty 2

## 2019-12-01 MED ORDER — SODIUM CHLORIDE 0.9 % IV SOLN
INTRAVENOUS | Status: DC | PRN
  Administered 2019-12-01: 1000 mL via INTRAVENOUS

## 2019-12-01 MED ORDER — MAGNESIUM OXIDE 400 (241.3 MG) MG PO TABS
400.0000 mg | ORAL_TABLET | Freq: Once | ORAL | Status: AC
  Administered 2019-12-01: 400 mg via ORAL
  Filled 2019-12-01: qty 1

## 2019-12-01 MED ORDER — POTASSIUM CHLORIDE CRYS ER 20 MEQ PO TBCR
20.0000 meq | EXTENDED_RELEASE_TABLET | Freq: Two times a day (BID) | ORAL | 0 refills | Status: DC
Start: 2019-12-01 — End: 2020-05-05

## 2019-12-01 MED ORDER — SODIUM CHLORIDE 0.9 % IV SOLN
1.0000 g | Freq: Once | INTRAVENOUS | Status: AC
  Administered 2019-12-01: 1 g via INTRAVENOUS
  Filled 2019-12-01: qty 10

## 2019-12-01 MED ORDER — POTASSIUM CHLORIDE 10 MEQ/100ML IV SOLN
10.0000 meq | INTRAVENOUS | Status: DC
  Administered 2019-12-01 – 2019-12-02 (×2): 10 meq via INTRAVENOUS
  Filled 2019-12-01 (×3): qty 100

## 2019-12-01 NOTE — ED Triage Notes (Signed)
Pt arrives from [place] at [facility] independent living. Reports frequent urination onset today. Hip fx last week per EMS.

## 2019-12-01 NOTE — ED Notes (Signed)
Pt difficult to redirect at this time, attempting to get out of bed, states she wants to go. Reassured patient and encouraged to rest in bed. Door to treatment room open for monitoring.

## 2019-12-01 NOTE — ED Provider Notes (Signed)
[facility] EMERGENCY DEPARTMENT Provider Note   CSN: [id number] Arrival date & time: 12/01/19  2105     History Chief Complaint  Patient presents with  . possible uti    Katrina Burnett is a 86 y.o. female.  HPI   Patient is a resident of [facility].  According to the EMS report the patient had a hip fracture and was in the hospital last week for that.  Today she has had frequent urination.  She was sent to the ED for further evaluation of that.  Patient herself denies any significant complaints.  She states she is not having any abdominal pain.  It does not burn when she urinates.  She denies any fevers or chills.  Records indicate she was admitted on July 4 for a pelvic fracture.  Pt was also in the hospital on July 11 esophagitis and gi bleeding.  Pt was back in the ED on July 18th for a headache.  Past Medical History:  Diagnosis Date  . Allergic rhinitis   . Anxiety   . Arthritis    "hands" (11/25/2016)  . Barrett's esophagus 06/1997  . CAP (community acquired pneumonia) 11/25/2016   "this is my 3rd time having pneumonia" (11/25/2016)  . Chondromalacia   . Chronic lower back pain   . Depression   . Diverticulosis   . GERD (gastroesophageal reflux disease)   . Glaucoma   . History of hiatal hernia   . History of stomach ulcers   . Lumbar compression fracture (HCC)    L1 and L2  . Osteoporosis   . Pneumonia   . Polymyalgia ([place])   . Stroke ([place])   . Tubular adenoma of colon 06/1999  . Vaginitis   . Varicose veins     Patient Active Problem List   Diagnosis Date Noted  . GIB (gastrointestinal bleeding) 11/21/2019  . Esophageal mass 11/19/2019  . Hiatal hernia   . Esophagitis   . Pelvic fracture ([place]) 11/11/2019  . Pneumothorax on left 10/24/2018  . Pneumothorax, left 10/24/2018  . Anemia 11/25/2016  . Hyponatremia 11/25/2016  . Fracture, intertrochanteric, right femur ([place]) 05/26/2015  . Closed fracture of femur ([place])   . Acute  blood loss anemia   . Adjustment disorder with mixed anxiety and depressed mood   . Fall   . Abnormality of gait   . Femur fracture, right, closed, initial encounter 05/23/2015  . Fracture of femoral neck, right, closed ([place]) 05/22/2015  . Femoral neck fracture, right, closed, initial encounter 05/22/2015  . Syncope 05/30/2012  . Nausea & vomiting 05/30/2012  . Chest pain 05/30/2012  . Hypotension 05/30/2012  . Esophageal reflux 12/16/2011  . Lumbar compression fracture ([place])   . Polymyalgia ([place])     Past Surgical History:  Procedure Laterality Date  . BACK SURGERY    . BIOPSY  11/19/2019   Procedure: BIOPSY;  Surgeon: Gatha Mayer, MD;  Location: [facility];  Service: Endoscopy;;  . CATARACT EXTRACTION W/ INTRAOCULAR LENS  IMPLANT, BILATERAL Bilateral   . ESOPHAGOGASTRODUODENOSCOPY (EGD) WITH PROPOFOL N/A 11/19/2019   Procedure: ESOPHAGOGASTRODUODENOSCOPY (EGD) WITH PROPOFOL;  Surgeon: Gatha Mayer, MD;  Location: [facility];  Service: Endoscopy;  Laterality: N/A;  . FIXATION KYPHOPLASTY LUMBAR SPINE  10/12/10; 09/20/11  . FRACTURE SURGERY    . IR KYPHO THORACIC WITH BONE BIOPSY  03/24/2018  . IR VERTEBROPLASTY CERV/THOR BX INC UNI/BIL INC/INJECT/IMAGING  03/24/2018  . LUMBAR SPINE SURGERY     "did OR  on 2 fractures in my lower back"  . LUNG BIOPSY    . ORIF HIP FRACTURE Right 05/23/2015   Procedure: OPEN REDUCTION INTERNAL FIXATION HIP;  Surgeon: Frederik Pear, MD;  Location: [facility];  Service: Orthopedics;  Laterality: Right;  Marland Kitchen VARICOSE VEIN SURGERY     vein ligation and stripping "years ago"     OB History    Gravida  4   Para  4   Term  4   Preterm      AB      Living  4     SAB      TAB      Ectopic      Multiple      Live Births              Family History  Problem Relation Age of Onset  . Colon cancer Mother 52  . Congestive Heart Failure Mother   . Heart disease Father   . Alcohol abuse Father   . Heart disease Maternal Grandmother     . Fibromyalgia Son     Social History   Tobacco Use  . Smoking status: Former Smoker    Years: 1.00    Types: Cigarettes  . Smokeless tobacco: Never Used  . Tobacco comment: At age 89 yrs old but nothing since   Vaping Use  . Vaping Use: Never used  Substance Use Topics  . Alcohol use: No  . Drug use: No    Home Medications Prior to Admission medications   Medication Sig Start Date End Date Taking? Authorizing Provider  acetaminophen (TYLENOL) 500 MG tablet Take 500-1,000 mg by mouth every 6 (six) hours as needed for moderate pain.    [provider]  aspirin EC 81 MG tablet Take 81 mg by mouth daily. Swallow whole.    [provider]  cholecalciferol (VITAMIN D) 1000 UNITS tablet Take 1,000 Units by mouth at bedtime.     [provider]  dorzolamide (TRUSOPT) 2 % ophthalmic solution Place 1 drop into both eyes 2 (two) times daily.     [provider]  latanoprost (XALATAN) 0.005 % ophthalmic solution Place 1 drop into both eyes at bedtime.    [provider]  metoCLOPramide (REGLAN) 10 MG tablet Take 1 tablet (10 mg total) by mouth every 6 (six) hours as needed for nausea (headache, recommend taking with benadryl (diphenhydramine) 25mg ). 11/25/19   Gareth Morgan, MD  Multiple Vitamin (MULTIVITAMIN) tablet Take 1 tablet by mouth at bedtime.     [provider]  oxyCODONE-acetaminophen (PERCOCET) 5-325 MG tablet Take 1-2 tablets by mouth every 6 (six) hours as needed. Patient taking differently: Take 1 tablet by mouth every 6 (six) hours as needed for moderate pain or severe pain.  03/02/18   Milton Ferguson, MD  pantoprazole (PROTONIX) 40 MG tablet Take 1 tablet (40 mg total) by mouth daily. 11/23/19 12/23/19  Shahmehdi, Valeria Batman, MD  polyethylene glycol (MIRALAX / GLYCOLAX) 17 g packet Take 17 g by mouth daily as needed for mild constipation. 11/12/19   Raiford Noble Latif, DO  Probiotic Product (ALIGN) 4 MG CAPS Take 4 mg by mouth  at bedtime.     [provider]  sucralfate (CARAFATE) 1 GM/10ML suspension Take 10 mLs (1 g total) by mouth 3 (three) times daily as needed. 11/23/19 12/23/19  ShahmehdiValeria Batman, MD  traZODone (DESYREL) 50 MG tablet Take 0.5-1 tablets (25-50 mg total) by mouth at bedtime as needed  for sleep. Patient taking differently: Take 50 mg by mouth at bedtime as needed for sleep.  10/28/18   [name], MD    Allergies    Avelox [moxifloxacin hcl in nacl], Biaxin [clarithromycin], Cefuroxime, Levaquin [levofloxacin in d5w], Minocycline, Paxil [paroxetine hcl], Prednisone, Remeron [mirtazapine], Sulfur, Trimox [amoxicillin], and Viibryd [vilazodone hcl]  Review of Systems   Review of Systems  All other systems reviewed and are negative.   Physical Exam Updated Vital Signs BP 108/69 (BP Location: Right Arm)   Pulse (!) 112   Temp 99.3 F (37.4 C) (Rectal)   Resp 20   Wt (!) 34.4 kg   SpO2 94% Comment: pt placed on supplemental oxygen for sustained spO2 at 90%  BMI 16.42 kg/m   Physical Exam Vitals and nursing note reviewed.  Constitutional:      General: She is not in acute distress.    Appearance: She is well-developed.  HENT:     Head: Normocephalic and atraumatic.     Right Ear: External ear normal.     Left Ear: External ear normal.  Eyes:     General: No scleral icterus.       Right eye: No discharge.        Left eye: No discharge.     Conjunctiva/sclera: Conjunctivae normal.  Neck:     Trachea: No tracheal deviation.  Cardiovascular:     Rate and Rhythm: Normal rate and regular rhythm.  Pulmonary:     Effort: Pulmonary effort is normal. No respiratory distress.     Breath sounds: Normal breath sounds. No stridor. No wheezing or rales.  Abdominal:     General: Bowel sounds are normal. There is no distension.     Palpations: Abdomen is soft.     Tenderness: There is no abdominal tenderness. There is no guarding or rebound.  Musculoskeletal:        General: No  tenderness.     Cervical back: Neck supple.  Skin:    General: Skin is warm and dry.     Findings: No rash.  Neurological:     Mental Status: She is alert.     Cranial Nerves: No cranial nerve deficit (no facial droop, extraocular movements intact, no slurred speech).     Sensory: No sensory deficit.     Motor: No abnormal muscle tone or seizure activity.     Coordination: Coordination normal.     ED Results / Procedures / Treatments   Labs (all labs ordered are listed, but only abnormal results are displayed) Labs Reviewed  URINALYSIS, COMPLETE (UACMP) WITH MICROSCOPIC - Abnormal; Notable for the following components:      Result Value   APPearance CLOUDY (*)    Hgb urine dipstick TRACE (*)    Protein, ur 30 (*)    Nitrite POSITIVE (*)    Leukocytes,Ua MODERATE (*)    Non Squamous Epithelial PRESENT (*)    Bacteria, UA MANY (*)    All other components within normal limits  CBC WITH DIFFERENTIAL/PLATELET - Abnormal; Notable for the following components:   WBC 16.1 (*)    RBC 3.66 (*)    Hemoglobin 10.6 (*)    HCT 32.8 (*)    Platelets 410 (*)    Neutro Abs 14.3 (*)    Lymphs Abs 0.6 (*)    Abs Immature Granulocytes 0.10 (*)    All other components within normal limits  BASIC METABOLIC PANEL - Abnormal; Notable for the following components:   Sodium 134 (*)  Potassium 2.7 (*)    Glucose, Bld 122 (*)    Calcium 8.6 (*)    GFR calc non Af Amer 56 (*)    All other components within normal limits    EKG None  Radiology No results found.  Procedures Procedures (including critical care time)  Medications Ordered in ED Medications  potassium chloride 10 mEq in 100 mL IVPB (has no administration in time range)  0.9 %  sodium chloride infusion (1,000 mLs Intravenous New Bag/Given 12/01/19 2250)  magnesium oxide (MAG-OX) tablet 400 mg (400 mg Oral Given 12/01/19 2252)  cefTRIAXone (ROCEPHIN) 1 g in sodium chloride 0.9 % 100 mL IVPB (1 g Intravenous New Bag/Given  12/01/19 2251)  acetaminophen (TYLENOL) tablet 650 mg (650 mg Oral Given 12/01/19 2252)    ED Course  I have reviewed the triage vital signs and the nursing notes.  Pertinent labs & imaging results that were available during my care of the patient were reviewed by me and considered in my medical decision making (see chart for details).  Clinical Course as of Nov 30 2332  Sat Dec 01, 2019  2232 Urinalysis is suggestive of a UTI.   [JK]  2232 Potassium decreased to 2.7   [JK]  2232 Hemoglobin decreased to 10.6   [JK]  2318 Pt is asking to leave.  Explained to pt EMS brought her here.  We are treating her for a uti and treating her potassium.  Will send her back to her home after that   [JK]  2332 HR 98.  A fib noted on monitor   [JK]    Clinical Course User Index [JK] Dorie Rank, MD   MDM Rules/Calculators/A&P                          Pt presented for urinary frequency.  UTI consistent with infection.  Labs also notable for hypokalemia.  Pt non toxic.  Afebrile.    Pt does not want to remain in the ED.   Will treat her with abx and potassium.  Anticipate dc back to nursing facility.    Infusion pending at time of shift change.  Final Clinical Impression(s) / ED Diagnoses Final diagnoses:  Hypokalemia  Acute cystitis without hematuria    Rx / DC Orders ED Discharge Orders    None       Dorie Rank, MD 12/01/19 2336

## 2019-12-02 DIAGNOSIS — N3000 Acute cystitis without hematuria: Secondary | ICD-10-CM | POA: Diagnosis not present

## 2019-12-02 DIAGNOSIS — Z743 Need for continuous supervision: Secondary | ICD-10-CM | POA: Diagnosis not present

## 2019-12-02 DIAGNOSIS — R279 Unspecified lack of coordination: Secondary | ICD-10-CM | POA: Diagnosis not present

## 2019-12-02 DIAGNOSIS — R0902 Hypoxemia: Secondary | ICD-10-CM | POA: Diagnosis not present

## 2019-12-02 MED ORDER — POTASSIUM CHLORIDE CRYS ER 20 MEQ PO TBCR
40.0000 meq | EXTENDED_RELEASE_TABLET | Freq: Once | ORAL | Status: AC
  Administered 2019-12-02: 40 meq via ORAL
  Filled 2019-12-02: qty 2

## 2019-12-02 NOTE — ED Notes (Signed)
Report attempted at facility. Staff member who answered the phone had trouble finding her assigned staff. She started to get report then stated that she would have someone call our facility back once she found the appropriate person.

## 2019-12-02 NOTE — ED Notes (Signed)
RN to room to hang 3rd bag of Potassium. Pt had pulled her IV out and it was sitting in her lap still running NS. EDP notified. IV site not bleeding.

## 2019-12-18 DIAGNOSIS — M6281 Muscle weakness (generalized): Secondary | ICD-10-CM | POA: Diagnosis not present

## 2019-12-18 DIAGNOSIS — R262 Difficulty in walking, not elsewhere classified: Secondary | ICD-10-CM | POA: Diagnosis not present

## 2019-12-18 DIAGNOSIS — M25551 Pain in right hip: Secondary | ICD-10-CM | POA: Diagnosis not present

## 2019-12-18 DIAGNOSIS — R2681 Unsteadiness on feet: Secondary | ICD-10-CM | POA: Diagnosis not present

## 2019-12-19 DIAGNOSIS — M6281 Muscle weakness (generalized): Secondary | ICD-10-CM | POA: Diagnosis not present

## 2019-12-19 DIAGNOSIS — R2681 Unsteadiness on feet: Secondary | ICD-10-CM | POA: Diagnosis not present

## 2019-12-19 DIAGNOSIS — R262 Difficulty in walking, not elsewhere classified: Secondary | ICD-10-CM | POA: Diagnosis not present

## 2019-12-19 DIAGNOSIS — M25551 Pain in right hip: Secondary | ICD-10-CM | POA: Diagnosis not present

## 2019-12-21 DIAGNOSIS — M25551 Pain in right hip: Secondary | ICD-10-CM | POA: Diagnosis not present

## 2019-12-21 DIAGNOSIS — R262 Difficulty in walking, not elsewhere classified: Secondary | ICD-10-CM | POA: Diagnosis not present

## 2019-12-21 DIAGNOSIS — M6281 Muscle weakness (generalized): Secondary | ICD-10-CM | POA: Diagnosis not present

## 2019-12-21 DIAGNOSIS — R2681 Unsteadiness on feet: Secondary | ICD-10-CM | POA: Diagnosis not present

## 2019-12-24 DIAGNOSIS — R262 Difficulty in walking, not elsewhere classified: Secondary | ICD-10-CM | POA: Diagnosis not present

## 2019-12-24 DIAGNOSIS — R2681 Unsteadiness on feet: Secondary | ICD-10-CM | POA: Diagnosis not present

## 2019-12-24 DIAGNOSIS — M25551 Pain in right hip: Secondary | ICD-10-CM | POA: Diagnosis not present

## 2019-12-24 DIAGNOSIS — M6281 Muscle weakness (generalized): Secondary | ICD-10-CM | POA: Diagnosis not present

## 2019-12-25 DIAGNOSIS — Z87440 Personal history of urinary (tract) infections: Secondary | ICD-10-CM | POA: Diagnosis not present

## 2019-12-25 DIAGNOSIS — N390 Urinary tract infection, site not specified: Secondary | ICD-10-CM | POA: Diagnosis not present

## 2019-12-25 DIAGNOSIS — F4321 Adjustment disorder with depressed mood: Secondary | ICD-10-CM | POA: Diagnosis not present

## 2019-12-25 DIAGNOSIS — F419 Anxiety disorder, unspecified: Secondary | ICD-10-CM | POA: Diagnosis not present

## 2019-12-26 DIAGNOSIS — M25551 Pain in right hip: Secondary | ICD-10-CM | POA: Diagnosis not present

## 2019-12-26 DIAGNOSIS — R262 Difficulty in walking, not elsewhere classified: Secondary | ICD-10-CM | POA: Diagnosis not present

## 2019-12-26 DIAGNOSIS — M6281 Muscle weakness (generalized): Secondary | ICD-10-CM | POA: Diagnosis not present

## 2019-12-26 DIAGNOSIS — R2681 Unsteadiness on feet: Secondary | ICD-10-CM | POA: Diagnosis not present

## 2020-01-02 DIAGNOSIS — R262 Difficulty in walking, not elsewhere classified: Secondary | ICD-10-CM | POA: Diagnosis not present

## 2020-01-02 DIAGNOSIS — M25551 Pain in right hip: Secondary | ICD-10-CM | POA: Diagnosis not present

## 2020-01-02 DIAGNOSIS — R2681 Unsteadiness on feet: Secondary | ICD-10-CM | POA: Diagnosis not present

## 2020-01-02 DIAGNOSIS — M6281 Muscle weakness (generalized): Secondary | ICD-10-CM | POA: Diagnosis not present

## 2020-01-03 DIAGNOSIS — F329 Major depressive disorder, single episode, unspecified: Secondary | ICD-10-CM | POA: Diagnosis not present

## 2020-01-03 DIAGNOSIS — M1611 Unilateral primary osteoarthritis, right hip: Secondary | ICD-10-CM | POA: Diagnosis not present

## 2020-01-03 DIAGNOSIS — F419 Anxiety disorder, unspecified: Secondary | ICD-10-CM | POA: Diagnosis not present

## 2020-01-03 DIAGNOSIS — M25551 Pain in right hip: Secondary | ICD-10-CM | POA: Diagnosis not present

## 2020-01-03 DIAGNOSIS — F5101 Primary insomnia: Secondary | ICD-10-CM | POA: Diagnosis not present

## 2020-01-04 DIAGNOSIS — R262 Difficulty in walking, not elsewhere classified: Secondary | ICD-10-CM | POA: Diagnosis not present

## 2020-01-04 DIAGNOSIS — M25551 Pain in right hip: Secondary | ICD-10-CM | POA: Diagnosis not present

## 2020-01-04 DIAGNOSIS — R2681 Unsteadiness on feet: Secondary | ICD-10-CM | POA: Diagnosis not present

## 2020-01-04 DIAGNOSIS — M6281 Muscle weakness (generalized): Secondary | ICD-10-CM | POA: Diagnosis not present

## 2020-01-08 DIAGNOSIS — M25561 Pain in right knee: Secondary | ICD-10-CM | POA: Diagnosis not present

## 2020-01-08 DIAGNOSIS — M25551 Pain in right hip: Secondary | ICD-10-CM | POA: Diagnosis not present

## 2020-01-09 ENCOUNTER — Other Ambulatory Visit (HOSPITAL_COMMUNITY): Payer: Self-pay | Admitting: Orthopedic Surgery

## 2020-01-09 DIAGNOSIS — M25551 Pain in right hip: Secondary | ICD-10-CM

## 2020-01-16 DIAGNOSIS — M25551 Pain in right hip: Secondary | ICD-10-CM | POA: Diagnosis not present

## 2020-01-16 DIAGNOSIS — R262 Difficulty in walking, not elsewhere classified: Secondary | ICD-10-CM | POA: Diagnosis not present

## 2020-01-16 DIAGNOSIS — R2681 Unsteadiness on feet: Secondary | ICD-10-CM | POA: Diagnosis not present

## 2020-01-16 DIAGNOSIS — M6281 Muscle weakness (generalized): Secondary | ICD-10-CM | POA: Diagnosis not present

## 2020-01-23 ENCOUNTER — Emergency Department (HOSPITAL_COMMUNITY): Payer: Medicare Other

## 2020-01-23 ENCOUNTER — Other Ambulatory Visit: Payer: Self-pay

## 2020-01-23 ENCOUNTER — Inpatient Hospital Stay (HOSPITAL_COMMUNITY)
Admission: EM | Admit: 2020-01-23 | Discharge: 2020-01-27 | DRG: 193 | Disposition: A | Discharge status: Skilled Nursing Facility | Payer: Medicare Other | Source: Skilled Nursing Facility | Attending: Internal Medicine | Admitting: Internal Medicine

## 2020-01-23 DIAGNOSIS — G4489 Other headache syndrome: Secondary | ICD-10-CM | POA: Diagnosis not present

## 2020-01-23 DIAGNOSIS — K2100 Gastro-esophageal reflux disease with esophagitis, without bleeding: Secondary | ICD-10-CM | POA: Diagnosis present

## 2020-01-23 DIAGNOSIS — Z20822 Contact with and (suspected) exposure to covid-19: Secondary | ICD-10-CM | POA: Diagnosis present

## 2020-01-23 DIAGNOSIS — Z66 Do not resuscitate: Secondary | ICD-10-CM | POA: Diagnosis present

## 2020-01-23 DIAGNOSIS — K2270 Barrett's esophagus without dysplasia: Secondary | ICD-10-CM | POA: Diagnosis present

## 2020-01-23 DIAGNOSIS — I517 Cardiomegaly: Secondary | ICD-10-CM | POA: Diagnosis not present

## 2020-01-23 DIAGNOSIS — J9621 Acute and chronic respiratory failure with hypoxia: Secondary | ICD-10-CM | POA: Diagnosis present

## 2020-01-23 DIAGNOSIS — K219 Gastro-esophageal reflux disease without esophagitis: Secondary | ICD-10-CM | POA: Diagnosis not present

## 2020-01-23 DIAGNOSIS — J9601 Acute respiratory failure with hypoxia: Secondary | ICD-10-CM | POA: Diagnosis present

## 2020-01-23 DIAGNOSIS — F419 Anxiety disorder, unspecified: Secondary | ICD-10-CM | POA: Diagnosis present

## 2020-01-23 DIAGNOSIS — F329 Major depressive disorder, single episode, unspecified: Secondary | ICD-10-CM | POA: Diagnosis present

## 2020-01-23 DIAGNOSIS — Z888 Allergy status to other drugs, medicaments and biological substances status: Secondary | ICD-10-CM | POA: Diagnosis not present

## 2020-01-23 DIAGNOSIS — Z8249 Family history of ischemic heart disease and other diseases of the circulatory system: Secondary | ICD-10-CM | POA: Diagnosis not present

## 2020-01-23 DIAGNOSIS — Z96641 Presence of right artificial hip joint: Secondary | ICD-10-CM | POA: Diagnosis present

## 2020-01-23 DIAGNOSIS — Z882 Allergy status to sulfonamides status: Secondary | ICD-10-CM | POA: Diagnosis not present

## 2020-01-23 DIAGNOSIS — M25551 Pain in right hip: Secondary | ICD-10-CM | POA: Diagnosis present

## 2020-01-23 DIAGNOSIS — R0902 Hypoxemia: Secondary | ICD-10-CM | POA: Diagnosis not present

## 2020-01-23 DIAGNOSIS — G8929 Other chronic pain: Secondary | ICD-10-CM | POA: Diagnosis present

## 2020-01-23 DIAGNOSIS — N1831 Chronic kidney disease, stage 3a: Secondary | ICD-10-CM | POA: Diagnosis present

## 2020-01-23 DIAGNOSIS — Z8673 Personal history of transient ischemic attack (TIA), and cerebral infarction without residual deficits: Secondary | ICD-10-CM | POA: Diagnosis not present

## 2020-01-23 DIAGNOSIS — Z8711 Personal history of peptic ulcer disease: Secondary | ICD-10-CM | POA: Diagnosis not present

## 2020-01-23 DIAGNOSIS — D509 Iron deficiency anemia, unspecified: Secondary | ICD-10-CM | POA: Diagnosis present

## 2020-01-23 DIAGNOSIS — J189 Pneumonia, unspecified organism: Principal | ICD-10-CM | POA: Diagnosis present

## 2020-01-23 DIAGNOSIS — R0602 Shortness of breath: Secondary | ICD-10-CM | POA: Diagnosis not present

## 2020-01-23 DIAGNOSIS — Z87891 Personal history of nicotine dependence: Secondary | ICD-10-CM | POA: Diagnosis not present

## 2020-01-23 DIAGNOSIS — J1282 Pneumonia due to coronavirus disease 2019: Secondary | ICD-10-CM | POA: Diagnosis not present

## 2020-01-23 DIAGNOSIS — D649 Anemia, unspecified: Secondary | ICD-10-CM | POA: Diagnosis present

## 2020-01-23 DIAGNOSIS — D75839 Thrombocytosis, unspecified: Secondary | ICD-10-CM | POA: Diagnosis present

## 2020-01-23 DIAGNOSIS — M810 Age-related osteoporosis without current pathological fracture: Secondary | ICD-10-CM | POA: Diagnosis present

## 2020-01-23 DIAGNOSIS — R7989 Other specified abnormal findings of blood chemistry: Secondary | ICD-10-CM | POA: Diagnosis present

## 2020-01-23 DIAGNOSIS — I10 Essential (primary) hypertension: Secondary | ICD-10-CM | POA: Diagnosis not present

## 2020-01-23 DIAGNOSIS — R5381 Other malaise: Secondary | ICD-10-CM | POA: Diagnosis not present

## 2020-01-23 DIAGNOSIS — K449 Diaphragmatic hernia without obstruction or gangrene: Secondary | ICD-10-CM | POA: Diagnosis present

## 2020-01-23 DIAGNOSIS — Z7982 Long term (current) use of aspirin: Secondary | ICD-10-CM

## 2020-01-23 DIAGNOSIS — K5909 Other constipation: Secondary | ICD-10-CM | POA: Diagnosis present

## 2020-01-23 DIAGNOSIS — R05 Cough: Secondary | ICD-10-CM | POA: Diagnosis not present

## 2020-01-23 DIAGNOSIS — Z79899 Other long term (current) drug therapy: Secondary | ICD-10-CM

## 2020-01-23 DIAGNOSIS — R279 Unspecified lack of coordination: Secondary | ICD-10-CM | POA: Diagnosis not present

## 2020-01-23 DIAGNOSIS — Z743 Need for continuous supervision: Secondary | ICD-10-CM | POA: Diagnosis not present

## 2020-01-23 DIAGNOSIS — R069 Unspecified abnormalities of breathing: Secondary | ICD-10-CM | POA: Diagnosis not present

## 2020-01-23 DIAGNOSIS — F418 Other specified anxiety disorders: Secondary | ICD-10-CM | POA: Diagnosis present

## 2020-01-23 DIAGNOSIS — J9811 Atelectasis: Secondary | ICD-10-CM | POA: Diagnosis not present

## 2020-01-23 DIAGNOSIS — U071 COVID-19: Secondary | ICD-10-CM | POA: Diagnosis not present

## 2020-01-23 LAB — LACTIC ACID, PLASMA
Lactic Acid, Venous: 0.7 mmol/L (ref 0.5–1.9)
Lactic Acid, Venous: 0.7 mmol/L (ref 0.5–1.9)

## 2020-01-23 LAB — URINALYSIS, ROUTINE W REFLEX MICROSCOPIC
Bilirubin Urine: NEGATIVE
Glucose, UA: NEGATIVE mg/dL
Hgb urine dipstick: NEGATIVE
Ketones, ur: NEGATIVE mg/dL
Nitrite: POSITIVE — AB
Protein, ur: NEGATIVE mg/dL
Specific Gravity, Urine: 1.012 (ref 1.005–1.030)
pH: 5 (ref 5.0–8.0)

## 2020-01-23 LAB — COMPREHENSIVE METABOLIC PANEL
ALT: 14 U/L (ref 0–44)
AST: 19 U/L (ref 15–41)
Albumin: 2.7 g/dL — ABNORMAL LOW (ref 3.5–5.0)
Alkaline Phosphatase: 88 U/L (ref 38–126)
Anion gap: 12 (ref 5–15)
BUN: 14 mg/dL (ref 8–23)
CO2: 20 mmol/L — ABNORMAL LOW (ref 22–32)
Calcium: 8.9 mg/dL (ref 8.9–10.3)
Chloride: 106 mmol/L (ref 98–111)
Creatinine, Ser: 0.9 mg/dL (ref 0.44–1.00)
GFR calc Af Amer: >60 mL/min (ref >60)
GFR calc non Af Amer: 55 mL/min — ABNORMAL LOW (ref >60)
Glucose, Bld: 90 mg/dL (ref 70–99)
Potassium: 4.1 mmol/L (ref 3.5–5.1)
Sodium: 138 mmol/L (ref 135–145)
Total Bilirubin: 0.4 mg/dL (ref 0.3–1.2)
Total Protein: 5.4 g/dL — ABNORMAL LOW (ref 6.5–8.1)

## 2020-01-23 LAB — CBC WITH DIFFERENTIAL/PLATELET
Abs Immature Granulocytes: 0.06 10*3/uL (ref 0.00–0.07)
Basophils Absolute: 0.1 10*3/uL (ref 0.0–0.1)
Basophils Relative: 1 %
Eosinophils Absolute: 0.4 10*3/uL (ref 0.0–0.5)
Eosinophils Relative: 5 %
HCT: 29.2 % — ABNORMAL LOW (ref 36.0–46.0)
Hemoglobin: 8.6 g/dL — ABNORMAL LOW (ref 12.0–15.0)
Immature Granulocytes: 1 %
Lymphocytes Relative: 13 %
Lymphs Abs: 1.2 10*3/uL (ref 0.7–4.0)
MCH: 25.4 pg — ABNORMAL LOW (ref 26.0–34.0)
MCHC: 29.5 g/dL — ABNORMAL LOW (ref 30.0–36.0)
MCV: 86.1 fL (ref 80.0–100.0)
Monocytes Absolute: 0.5 10*3/uL (ref 0.1–1.0)
Monocytes Relative: 6 %
Neutro Abs: 6.9 10*3/uL (ref 1.7–7.7)
Neutrophils Relative %: 74 %
Platelets: 549 10*3/uL — ABNORMAL HIGH (ref 150–400)
RBC: 3.39 MIL/uL — ABNORMAL LOW (ref 3.87–5.11)
RDW: 15 % (ref 11.5–15.5)
WBC: 9.1 10*3/uL (ref 4.0–10.5)
nRBC: 0 % (ref 0.0–0.2)

## 2020-01-23 LAB — C-REACTIVE PROTEIN: CRP: 8 mg/dL — ABNORMAL HIGH (ref <1.0)

## 2020-01-23 LAB — IRON AND TIBC
Iron: 9 ug/dL — ABNORMAL LOW (ref 28–170)
Saturation Ratios: 2 % — ABNORMAL LOW (ref 10.4–31.8)
TIBC: 407 ug/dL (ref 250–450)
UIBC: 398 ug/dL

## 2020-01-23 LAB — FOLATE: Folate: 54.3 ng/mL (ref >5.9)

## 2020-01-23 LAB — MAGNESIUM: Magnesium: 1.9 mg/dL (ref 1.7–2.4)

## 2020-01-23 LAB — RETICULOCYTES
Immature Retic Fract: 18.1 % — ABNORMAL HIGH (ref 2.3–15.9)
RBC.: 3.37 MIL/uL — ABNORMAL LOW (ref 3.87–5.11)
Retic Count, Absolute: 36.4 10*3/uL (ref 19.0–186.0)
Retic Ct Pct: 1.1 % (ref 0.4–3.1)

## 2020-01-23 LAB — PHOSPHORUS: Phosphorus: 3.1 mg/dL (ref 2.5–4.6)

## 2020-01-23 LAB — LACTATE DEHYDROGENASE: LDH: 169 U/L (ref 98–192)

## 2020-01-23 LAB — TSH: TSH: 1.293 u[IU]/mL (ref 0.350–4.500)

## 2020-01-23 LAB — POC OCCULT BLOOD, ED: Fecal Occult Bld: NEGATIVE

## 2020-01-23 LAB — FERRITIN: Ferritin: 20 ng/mL (ref 11–307)

## 2020-01-23 LAB — SARS CORONAVIRUS 2 BY RT PCR (HOSPITAL ORDER, PERFORMED IN ~~LOC~~ HOSPITAL LAB): SARS Coronavirus 2: NEGATIVE

## 2020-01-23 LAB — VITAMIN B12: Vitamin B-12: 1402 pg/mL — ABNORMAL HIGH (ref 180–914)

## 2020-01-23 LAB — PROCALCITONIN: Procalcitonin: <0.1 ng/mL

## 2020-01-23 LAB — TRIGLYCERIDES: Triglycerides: 63 mg/dL (ref <150)

## 2020-01-23 LAB — TROPONIN I (HIGH SENSITIVITY): Troponin I (High Sensitivity): 10 ng/L (ref <18)

## 2020-01-23 LAB — FIBRINOGEN: Fibrinogen: 643 mg/dL — ABNORMAL HIGH (ref 210–475)

## 2020-01-23 LAB — D-DIMER, QUANTITATIVE: D-Dimer, Quant: 2.51 ug/mL-FEU — ABNORMAL HIGH (ref 0.00–0.50)

## 2020-01-23 LAB — STREP PNEUMONIAE URINARY ANTIGEN: Strep Pneumo Urinary Antigen: NEGATIVE

## 2020-01-23 MED ORDER — OXYCODONE-ACETAMINOPHEN 10-325 MG PO TABS
1.0000 | ORAL_TABLET | Freq: Four times a day (QID) | ORAL | Status: DC | PRN
Start: 2020-01-23 — End: 2020-01-23

## 2020-01-23 MED ORDER — SODIUM CHLORIDE 0.9 % IV SOLN
1.0000 g | Freq: Once | INTRAVENOUS | Status: AC
  Administered 2020-01-23: 1 g via INTRAVENOUS
  Filled 2020-01-23: qty 10

## 2020-01-23 MED ORDER — PANTOPRAZOLE SODIUM 40 MG PO TBEC
40.0000 mg | DELAYED_RELEASE_TABLET | Freq: Every day | ORAL | Status: DC
  Administered 2020-01-23 – 2020-01-27 (×5): 40 mg via ORAL
  Filled 2020-01-23 (×5): qty 1

## 2020-01-23 MED ORDER — POLYETHYLENE GLYCOL 3350 17 G PO PACK
17.0000 g | PACK | Freq: Every day | ORAL | Status: DC
  Administered 2020-01-26 – 2020-01-27 (×2): 17 g via ORAL
  Filled 2020-01-23 (×3): qty 1

## 2020-01-23 MED ORDER — CITALOPRAM HYDROBROMIDE 20 MG PO TABS
40.0000 mg | ORAL_TABLET | Freq: Every day | ORAL | Status: DC
  Administered 2020-01-23 – 2020-01-27 (×5): 40 mg via ORAL
  Filled 2020-01-23 (×3): qty 4
  Filled 2020-01-23 (×3): qty 2

## 2020-01-23 MED ORDER — SODIUM CHLORIDE 0.9 % IV SOLN
1.0000 g | INTRAVENOUS | Status: DC
  Administered 2020-01-24: 1 g via INTRAVENOUS
  Filled 2020-01-23: qty 10

## 2020-01-23 MED ORDER — CYCLOBENZAPRINE HCL 10 MG PO TABS
10.0000 mg | ORAL_TABLET | Freq: Three times a day (TID) | ORAL | Status: DC | PRN
  Administered 2020-01-27: 10 mg via ORAL
  Filled 2020-01-23: qty 1

## 2020-01-23 MED ORDER — ONDANSETRON HCL 4 MG PO TABS: 4.0000 mg | ORAL_TABLET | Freq: Four times a day (QID) | ORAL | Status: DC | PRN

## 2020-01-23 MED ORDER — SODIUM CHLORIDE 0.9 % IV SOLN
500.0000 mg | INTRAVENOUS | Status: DC
  Administered 2020-01-24: 500 mg via INTRAVENOUS
  Filled 2020-01-23: qty 500

## 2020-01-23 MED ORDER — ONDANSETRON HCL 4 MG/2ML IJ SOLN: 4.0000 mg | Freq: Four times a day (QID) | INTRAMUSCULAR | Status: DC | PRN

## 2020-01-23 MED ORDER — GABAPENTIN 300 MG PO CAPS
600.0000 mg | ORAL_CAPSULE | Freq: Every day | ORAL | Status: DC
  Administered 2020-01-23 – 2020-01-26 (×4): 600 mg via ORAL
  Filled 2020-01-23 (×4): qty 2

## 2020-01-23 MED ORDER — TRAZODONE HCL 50 MG PO TABS
50.0000 mg | ORAL_TABLET | Freq: Every day | ORAL | Status: DC
  Administered 2020-01-23 – 2020-01-26 (×4): 50 mg via ORAL
  Filled 2020-01-23 (×4): qty 1

## 2020-01-23 MED ORDER — ACETAMINOPHEN 650 MG RE SUPP: 650.0000 mg | Freq: Four times a day (QID) | RECTAL | Status: DC | PRN

## 2020-01-23 MED ORDER — ACETAMINOPHEN 325 MG PO TABS
650.0000 mg | ORAL_TABLET | Freq: Four times a day (QID) | ORAL | Status: DC | PRN
  Administered 2020-01-25 – 2020-01-27 (×4): 650 mg via ORAL
  Filled 2020-01-23 (×4): qty 2

## 2020-01-23 MED ORDER — OXYCODONE HCL 5 MG PO TABS
5.0000 mg | ORAL_TABLET | Freq: Four times a day (QID) | ORAL | Status: DC | PRN
  Administered 2020-01-24 – 2020-01-27 (×5): 5 mg via ORAL
  Filled 2020-01-23 (×5): qty 1

## 2020-01-23 MED ORDER — SODIUM CHLORIDE 0.9 % IV SOLN
500.0000 mg | Freq: Once | INTRAVENOUS | Status: AC
  Administered 2020-01-23: 500 mg via INTRAVENOUS
  Filled 2020-01-23: qty 500

## 2020-01-23 MED ORDER — BACID PO TABS
1.0000 | ORAL_TABLET | Freq: Every day | ORAL | Status: DC
  Administered 2020-01-23 – 2020-01-26 (×4): 1 via ORAL
  Filled 2020-01-23 (×6): qty 1

## 2020-01-23 MED ORDER — OXYCODONE-ACETAMINOPHEN 5-325 MG PO TABS
1.0000 | ORAL_TABLET | Freq: Four times a day (QID) | ORAL | Status: DC | PRN
  Administered 2020-01-23 – 2020-01-27 (×9): 1 via ORAL
  Filled 2020-01-23 (×9): qty 1

## 2020-01-23 MED ORDER — LORAZEPAM 1 MG PO TABS
2.0000 mg | ORAL_TABLET | Freq: Four times a day (QID) | ORAL | Status: DC | PRN
  Administered 2020-01-24 – 2020-01-27 (×8): 2 mg via ORAL
  Filled 2020-01-23 (×8): qty 2

## 2020-01-23 NOTE — ED Provider Notes (Signed)
[facility] EMERGENCY DEPARTMENT Provider Note   CSN: [id number] Arrival date & time: 01/23/20  0944     History Chief Complaint  Patient presents with  . Covid Positive    Katrina Burnett is a 86 y.o. female.  HPI 86 year old female presents with positive covid test. She comes from a nursing facility. Gets tested weekly, and due to symptoms was tested again this morning with a "rapid test" and found to be positive. She endorses cough and sore throat. EMS  Noted fever. She denies chest pain/dyspnea.    Past Medical History:  Diagnosis Date  . Allergic rhinitis   . Anxiety   . Arthritis    "hands" (11/25/2016)  . Barrett's esophagus 06/1997  . CAP (community acquired pneumonia) 11/25/2016   "this is my 3rd time having pneumonia" (11/25/2016)  . Chondromalacia   . Chronic lower back pain   . Depression   . Diverticulosis   . GERD (gastroesophageal reflux disease)   . Glaucoma   . History of hiatal hernia   . History of stomach ulcers   . Lumbar compression fracture (HCC)    L1 and L2  . Osteoporosis   . Pneumonia   . Polymyalgia ([place])   . Stroke ([place])   . Tubular adenoma of colon 06/1999  . Vaginitis   . Varicose veins     Patient Active Problem List   Diagnosis Date Noted  . Acute hypoxemic respiratory failure ([place]) 01/23/2020  . Depression   . Thrombocytosis ([place])   . GIB (gastrointestinal bleeding) 11/21/2019  . Esophageal mass 11/19/2019  . Hiatal hernia   . Esophagitis   . Pelvic fracture ([place]) 11/11/2019  . Pneumothorax on left 10/24/2018  . Pneumothorax, left 10/24/2018  . Acute on chronic anemia 11/25/2016  . Hyponatremia 11/25/2016  . Fracture, intertrochanteric, right femur ([place]) 05/26/2015  . Closed fracture of femur ([place])   . Acute blood loss anemia   . Adjustment disorder with mixed anxiety and depressed mood   . Fall   . Abnormality of gait   . Femur fracture, right, closed, initial encounter 05/23/2015  . Fracture of  femoral neck, right, closed ([place]) 05/22/2015  . Femoral neck fracture, right, closed, initial encounter 05/22/2015  . Syncope 05/30/2012  . Nausea & vomiting 05/30/2012  . Chest pain 05/30/2012  . Hypotension 05/30/2012  . GERD (gastroesophageal reflux disease) 12/16/2011  . Lumbar compression fracture ([place])   . Polymyalgia ([place])     Past Surgical History:  Procedure Laterality Date  . BACK SURGERY    . BIOPSY  11/19/2019   Procedure: BIOPSY;  Surgeon: Gatha Mayer, MD;  Location: [place];  Service: Endoscopy;;  . CATARACT EXTRACTION W/ INTRAOCULAR LENS  IMPLANT, BILATERAL Bilateral   . ESOPHAGOGASTRODUODENOSCOPY (EGD) WITH PROPOFOL N/A 11/19/2019   Procedure: ESOPHAGOGASTRODUODENOSCOPY (EGD) WITH PROPOFOL;  Surgeon: Gatha Mayer, MD;  Location: [facility];  Service: Endoscopy;  Laterality: N/A;  . FIXATION KYPHOPLASTY LUMBAR SPINE  10/12/10; 09/20/11  . FRACTURE SURGERY    . IR KYPHO THORACIC WITH BONE BIOPSY  03/24/2018  . IR VERTEBROPLASTY CERV/THOR BX INC UNI/BIL INC/INJECT/IMAGING  03/24/2018  . LUMBAR SPINE SURGERY     "did OR on 2 fractures in my lower back"  . LUNG BIOPSY    . ORIF HIP FRACTURE Right 05/23/2015   Procedure: OPEN REDUCTION INTERNAL FIXATION HIP;  Surgeon: Frederik Pear, MD;  Location: [facility];  Service: Orthopedics;  Laterality: Right;  Marland Kitchen VARICOSE VEIN SURGERY  vein ligation and stripping "years ago"     OB History    Gravida  4   Para  4   Term  4   Preterm      AB      Living  4     SAB      TAB      Ectopic      Multiple      Live Births              Family History  Problem Relation Age of Onset  . Colon cancer Mother 59  . Congestive Heart Failure Mother   . Heart disease Father   . Alcohol abuse Father   . Heart disease Maternal Grandmother   . Fibromyalgia Son     Social History   Tobacco Use  . Smoking status: Former Smoker    Years: 1.00    Types: Cigarettes  . Smokeless tobacco: Never Used  . Tobacco  comment: At age 47 yrs old but nothing since   Vaping Use  . Vaping Use: Never used  Substance Use Topics  . Alcohol use: No  . Drug use: No    Home Medications Prior to Admission medications   Medication Sig Start Date End Date Taking? Authorizing Provider  acetaminophen (TYLENOL) 500 MG tablet Take 500-1,000 mg by mouth every 6 (six) hours as needed for moderate pain.    [provider]  aspirin EC 81 MG tablet Take 81 mg by mouth daily. Swallow whole.    [provider]  cholecalciferol (VITAMIN D) 1000 UNITS tablet Take 1,000 Units by mouth at bedtime.     [provider]  dorzolamide (TRUSOPT) 2 % ophthalmic solution Place 1 drop into both eyes 2 (two) times daily.     [provider]  latanoprost (XALATAN) 0.005 % ophthalmic solution Place 1 drop into both eyes at bedtime.    [provider]  metoCLOPramide (REGLAN) 10 MG tablet Take 1 tablet (10 mg total) by mouth every 6 (six) hours as needed for nausea (headache, recommend taking with benadryl (diphenhydramine) 25mg ). 11/25/19   Gareth Morgan, MD  Multiple Vitamin (MULTIVITAMIN) tablet Take 1 tablet by mouth at bedtime.     [provider]  oxyCODONE-acetaminophen (PERCOCET) 5-325 MG tablet Take 1-2 tablets by mouth every 6 (six) hours as needed. Patient taking differently: Take 1 tablet by mouth every 6 (six) hours as needed for moderate pain or severe pain.  03/02/18   Milton Ferguson, MD  pantoprazole (PROTONIX) 40 MG tablet Take 1 tablet (40 mg total) by mouth daily. 11/23/19 12/23/19  Shahmehdi, Valeria Batman, MD  polyethylene glycol (MIRALAX / GLYCOLAX) 17 g packet Take 17 g by mouth daily as needed for mild constipation. 11/12/19   Raiford Noble Latif, DO  potassium chloride SA (KLOR-CON) 20 MEQ tablet Take 1 tablet (20 mEq total) by mouth 2 (two) times daily. 12/01/19   Dorie Rank, MD  Probiotic Product (ALIGN) 4 MG CAPS Take 4 mg by mouth at bedtime.     [provider]    sucralfate (CARAFATE) 1 GM/10ML suspension Take 10 mLs (1 g total) by mouth 3 (three) times daily as needed. 11/23/19 12/23/19  ShahmehdiValeria Batman, MD  traZODone (DESYREL) 50 MG tablet Take 0.5-1 tablets (25-50 mg total) by mouth at bedtime as needed for sleep. Patient taking differently: Take 50 mg by mouth at bedtime as needed for sleep.  10/28/18   [name], MD  Allergies    Avelox [moxifloxacin hcl in nacl], Biaxin [clarithromycin], Cefuroxime, Levaquin [levofloxacin in d5w], Minocycline, Paxil [paroxetine hcl], Prednisone, Remeron [mirtazapine], Sulfur, Trimox [amoxicillin], and Viibryd [vilazodone hcl]  Review of Systems   Review of Systems  Constitutional: Positive for fever.  HENT: Positive for sore throat.   Respiratory: Positive for cough. Negative for shortness of breath.   Cardiovascular: Negative for chest pain.  All other systems reviewed and are negative.   Physical Exam Updated Vital Signs BP 120/62   Pulse 72   Temp 99 F (37.2 C) (Oral)   Resp 12   SpO2 99%   Physical Exam Vitals and nursing note reviewed.  Constitutional:      General: She is not in acute distress.    Appearance: She is well-developed. She is not diaphoretic.  HENT:     Head: Normocephalic and atraumatic.     Right Ear: External ear normal.     Left Ear: External ear normal.     Nose: Nose normal.  Eyes:     General:        Right eye: No discharge.        Left eye: No discharge.  Cardiovascular:     Rate and Rhythm: Normal rate and regular rhythm.     Heart sounds: Normal heart sounds.  Pulmonary:     Effort: Pulmonary effort is normal. No tachypnea, accessory muscle usage or respiratory distress.     Breath sounds: Normal breath sounds.  Abdominal:     General: There is no distension.     Palpations: Abdomen is soft.     Tenderness: There is no abdominal tenderness.  Skin:    General: Skin is warm and dry.  Neurological:     Mental Status: She is alert. She is disoriented.      Comments: She knows it is Wednesday and who the president is. Is confused on month/year.   Psychiatric:        Mood and Affect: Mood is not anxious.     ED Results / Procedures / Treatments   Labs (all labs ordered are listed, but only abnormal results are displayed) Labs Reviewed  CBC WITH DIFFERENTIAL/PLATELET - Abnormal; Notable for the following components:      Result Value   RBC 3.39 (*)    Hemoglobin 8.6 (*)    HCT 29.2 (*)    MCH 25.4 (*)    MCHC 29.5 (*)    Platelets 549 (*)    All other components within normal limits  COMPREHENSIVE METABOLIC PANEL - Abnormal; Notable for the following components:   CO2 20 (*)    Total Protein 5.4 (*)    Albumin 2.7 (*)    GFR calc non Af Amer 55 (*)    All other components within normal limits  D-DIMER, QUANTITATIVE (NOT AT [facility]) - Abnormal; Notable for the following components:   D-Dimer, Quant 2.51 (*)    All other components within normal limits  FIBRINOGEN - Abnormal; Notable for the following components:   Fibrinogen 643 (*)    All other components within normal limits  C-REACTIVE PROTEIN - Abnormal; Notable for the following components:   CRP 8.0 (*)    All other components within normal limits  VITAMIN B12 - Abnormal; Notable for the following components:   Vitamin B-12 1,402 (*)    All other components within normal limits  IRON AND TIBC - Abnormal; Notable for the following components:   Iron 9 (*)  Saturation Ratios 2 (*)    All other components within normal limits  RETICULOCYTES - Abnormal; Notable for the following components:   RBC. 3.37 (*)    Immature Retic Fract 18.1 (*)    All other components within normal limits  SARS CORONAVIRUS 2 BY RT PCR (HOSPITAL ORDER, [facility])  CULTURE, BLOOD (ROUTINE X 2)  CULTURE, BLOOD (ROUTINE X 2)  SARS CORONAVIRUS 2 BY RT PCR (HOSPITAL ORDER, [facility])  LACTIC ACID, PLASMA  PROCALCITONIN  LACTATE  DEHYDROGENASE  FERRITIN  TRIGLYCERIDES  LACTIC ACID, PLASMA  FOLATE  MAGNESIUM  PHOSPHORUS  TSH  URINALYSIS, ROUTINE W REFLEX MICROSCOPIC  STREP PNEUMONIAE URINARY ANTIGEN  LEGIONELLA PNEUMOPHILA SEROGP 1 UR AG  OCCULT BLOOD X 1 CARD TO LAB, STOOL  POC OCCULT BLOOD, ED  TROPONIN I (HIGH SENSITIVITY)    EKG EKG Interpretation  Date/Time:  Wednesday January 23 2020 10:00:43 EDT Ventricular Rate:  88 PR Interval:    QRS Duration: 79 QT Interval:  380 QTC Calculation: 460 R Axis:   -21 Text Interpretation: Sinus rhythm Atrial premature complex Borderline left axis deviation Probable anteroseptal infarct, old similar to July 2021 Confirmed by Sherwood Gambler [phone]) on 01/23/2020 10:08:55 AM   Radiology DG Chest Port 1 View  Result Date: 01/23/2020 CLINICAL DATA:  Cough and shortness of breath EXAM: PORTABLE CHEST 1 VIEW COMPARISON:  October 26, 2019 FINDINGS: There is a focal area of atelectasis and questionable associated infiltrate in the right upper lobe. There is mild atelectasis in the bases. No airspace consolidation. There is cardiomegaly with pulmonary vascularity normal. No adenopathy. There is a focal hiatal hernia. Bones are osteoporotic. Multiple compression fractures in the thoracic and lumbar region with evidence of prior kyphoplasty procedures at several levels. IMPRESSION: Atelectasis with questionable early pneumonia right upper lobe. Atelectatic change noted in the lung bases. Heart is mildly enlarged with pulmonary vascularity normal. Hiatal hernia present. Osteoporosis, status post kyphoplasty procedures in the midthoracic and upper lumbar regions. Electronically Signed   By: Lowella Grip III M.D.   On: 01/23/2020 10:38    Procedures Procedures (including critical care time)  Medications Ordered in ED Medications  acetaminophen (TYLENOL) tablet 650 mg (has no administration in time range)    Or  acetaminophen (TYLENOL) suppository 650 mg (has no  administration in time range)  ondansetron (ZOFRAN) tablet 4 mg (has no administration in time range)    Or  ondansetron (ZOFRAN) injection 4 mg (has no administration in time range)  cefTRIAXone (ROCEPHIN) 1 g in sodium chloride 0.9 % 100 mL IVPB (has no administration in time range)  azithromycin (ZITHROMAX) 500 mg in sodium chloride 0.9 % 250 mL IVPB (has no administration in time range)  cefTRIAXone (ROCEPHIN) 1 g in sodium chloride 0.9 % 100 mL IVPB (0 g Intravenous Stopped 01/23/20 1343)  azithromycin (ZITHROMAX) 500 mg in sodium chloride 0.9 % 250 mL IVPB (500 mg Intravenous New Bag/Given 01/23/20 1313)    ED Course  I have reviewed the triage vital signs and the nursing notes.  Pertinent labs & imaging results that were available during my care of the patient were reviewed by me and considered in my medical decision making (see chart for details).    MDM Rules/Calculators/A&P                          Patient presents with possible Covid.  The history from the nursing  facility indicates that she has been having cough/congestion for a couple days and they tested her with a "rapid test" that was positive this morning.  By the sound of their description it sounds like an antigen test though they are not 100% sure what it is.  However her PCR is negative today.  Unclear if this is a false negative or if the earlier test was false positive.  Given the signs of pneumonia on her x-ray she will be treated with antibiotics.  She is mildly hypoxic to the high 80s and so will need to be admitted with supportive care.  She is also noted to be anemic though unclear why this is occurring as her rectal exam did not show any obvious melena or bleeding.  Admit to hospitalist service.  Katrina Burnett was evaluated in Emergency Department on 01/23/2020 for the symptoms described in the history of present illness. She was evaluated in the context of the global COVID-19 pandemic, which necessitated consideration  that the patient might be at risk for infection with the SARS-CoV-2 virus that causes COVID-19. Institutional protocols and algorithms that pertain to the evaluation of patients at risk for COVID-19 are in a state of rapid change based on information released by regulatory bodies including the CDC and federal and state organizations. These policies and algorithms were followed during the patient's care in the ED.  Final Clinical Impression(s) / ED Diagnoses Final diagnoses:  Acute respiratory failure with hypoxia (HCC)  Pneumonia of right upper lobe due to infectious organism    Rx / DC Orders ED Discharge Orders    None       Sherwood Gambler, MD 01/23/20 1427

## 2020-01-23 NOTE — H&P (Addendum)
History and Physical    Katrina Burnett PNT:[id number] DOB: 14-Mar-1925 DOA: 01/23/2020  PCP: Jani Gravel, MD  Patient coming from: [facility]  I have personally briefly reviewed patient's old medical records in [place]  Chief Complaint: "Iam not feeling well"  HPI: Katrina Burnett is a 86 y.o. female with medical history significant of grade C esophagitis, depression/anxiety, hiatal hernia, chronic lower back pain, history of lumbar compression fracture L1-L2, CKD stage III, diverticulosis presents to emergency department with cough, not feeling well since 5 days.  Patient tells me that she has dry cough, sore throat, not feeling well overall, right-sided chest pain since 4 to 5 days.  Reports chest pain is 4 out of 10, nonradiating, comes and goes, denies association with shortness of breath, palpitation, leg swelling, orthopnea, PND.  She is fully vaccinated against COVID-19.  She gets tested weekly and due to her symptoms she was tested again this morning with a rapid test which came back positive.  She reports burning sensation in her esophagus, denies headache, blurry vision, wheezing, lightheadedness, dizziness, nausea, vomiting, melena, hematemesis, decreased appetite, urinary or bowel changes.  No history of smoking, alcohol, illicit drug use.  ED Course: Upon arrival to ED: Patient's vital signs stable, oxygen saturation in 89% requiring 1 L of oxygen via nasal cannula, Covid PCR: Negative, CBC shows no leukocytosis, H&H: 8.6/29.2 was 10.6/32.5-month ago, platelet: 549, lactic acid: WNL, POC occult blood: Negative, D-dimer: 2.5, CRP: 8, iron studies, B12, folate: Pending.  Chest x-ray: Atelectasis and questionable right upper lobe pneumonia.  Patient received Rocephin and azithromycin in ED.  [facility] hospitalist consulted for admission due to acute hypoxemic respiratory failure secondary to pneumonia.  Review of Systems: As per HPI otherwise negative.    Past  Medical History:  Diagnosis Date  . Allergic rhinitis   . Anxiety   . Arthritis    "hands" (11/25/2016)  . Barrett's esophagus 06/1997  . CAP (community acquired pneumonia) 11/25/2016   "this is my 3rd time having pneumonia" (11/25/2016)  . Chondromalacia   . Chronic lower back pain   . Depression   . Diverticulosis   . GERD (gastroesophageal reflux disease)   . Glaucoma   . History of hiatal hernia   . History of stomach ulcers   . Lumbar compression fracture (HCC)    L1 and L2  . Osteoporosis   . Pneumonia   . Polymyalgia ([place])   . Stroke ([place])   . Tubular adenoma of colon 06/1999  . Vaginitis   . Varicose veins     Past Surgical History:  Procedure Laterality Date  . BACK SURGERY    . BIOPSY  11/19/2019   Procedure: BIOPSY;  Surgeon: Gatha Mayer, MD;  Location: [facility];  Service: Endoscopy;;  . CATARACT EXTRACTION W/ INTRAOCULAR LENS  IMPLANT, BILATERAL Bilateral   . ESOPHAGOGASTRODUODENOSCOPY (EGD) WITH PROPOFOL N/A 11/19/2019   Procedure: ESOPHAGOGASTRODUODENOSCOPY (EGD) WITH PROPOFOL;  Surgeon: Gatha Mayer, MD;  Location: [facility];  Service: Endoscopy;  Laterality: N/A;  . FIXATION KYPHOPLASTY LUMBAR SPINE  10/12/10; 09/20/11  . FRACTURE SURGERY    . IR KYPHO THORACIC WITH BONE BIOPSY  03/24/2018  . IR VERTEBROPLASTY CERV/THOR BX INC UNI/BIL INC/INJECT/IMAGING  03/24/2018  . LUMBAR SPINE SURGERY     "did OR on 2 fractures in my lower back"  . LUNG BIOPSY    . ORIF HIP FRACTURE Right 05/23/2015   Procedure: OPEN REDUCTION INTERNAL FIXATION HIP;  Surgeon: Pilar Plate  Mayer Camel, MD;  Location: [facility];  Service: Orthopedics;  Laterality: Right;  Marland Kitchen VARICOSE VEIN SURGERY     vein ligation and stripping "years ago"     reports that she has quit smoking. Her smoking use included cigarettes. She quit after 1.00 year of use. She has never used smokeless tobacco. She reports that she does not drink alcohol and does not use drugs.  Allergies  Allergen Reactions  . Avelox  [Moxifloxacin Hcl In Nacl] Other (See Comments)    Felt bad  . Biaxin [Clarithromycin] Nausea Only  . Cefuroxime Diarrhea  . Levaquin [Levofloxacin In D5w] Diarrhea  . Minocycline Nausea And Vomiting  . Paxil [Paroxetine Hcl] Nausea And Vomiting  . Prednisone Nausea Only  . Remeron [Mirtazapine]     Doesn't remember   . Sulfur Nausea And Vomiting  . Trimox [Amoxicillin] Other (See Comments)    Doesn't remember  Has patient had a PCN reaction causing immediate rash, facial/tongue/throat swelling, SOB or lightheadedness with hypotension: Unknown Has patient had a PCN reaction causing severe rash involving mucus membranes or skin necrosis: Unknown Has patient had a PCN reaction that required hospitalization: Unknown Has patient had a PCN reaction occurring within the last 10 years: Unknown If all of the above answers are "NO", then may proceed with Cephalosporin use.  Arman Filter Hcl] Other (See Comments)    Doesn't remember     Family History  Problem Relation Age of Onset  . Colon cancer Mother 71  . Congestive Heart Failure Mother   . Heart disease Father   . Alcohol abuse Father   . Heart disease Maternal Grandmother   . Fibromyalgia Son     Prior to Admission medications   Medication Sig Start Date End Date Taking? Authorizing Provider  acetaminophen (TYLENOL) 500 MG tablet Take 500-1,000 mg by mouth every 6 (six) hours as needed for moderate pain.    [provider]  aspirin EC 81 MG tablet Take 81 mg by mouth daily. Swallow whole.    [provider]  cholecalciferol (VITAMIN D) 1000 UNITS tablet Take 1,000 Units by mouth at bedtime.     [provider]  dorzolamide (TRUSOPT) 2 % ophthalmic solution Place 1 drop into both eyes 2 (two) times daily.     [provider]  latanoprost (XALATAN) 0.005 % ophthalmic solution Place 1 drop into both eyes at bedtime.    [provider]  metoCLOPramide (REGLAN) 10 MG tablet Take 1  tablet (10 mg total) by mouth every 6 (six) hours as needed for nausea (headache, recommend taking with benadryl (diphenhydramine) 25mg ). 11/25/19   Gareth Morgan, MD  Multiple Vitamin (MULTIVITAMIN) tablet Take 1 tablet by mouth at bedtime.     [provider]  oxyCODONE-acetaminophen (PERCOCET) 5-325 MG tablet Take 1-2 tablets by mouth every 6 (six) hours as needed. Patient taking differently: Take 1 tablet by mouth every 6 (six) hours as needed for moderate pain or severe pain.  03/02/18   Milton Ferguson, MD  pantoprazole (PROTONIX) 40 MG tablet Take 1 tablet (40 mg total) by mouth daily. 11/23/19 12/23/19  Shahmehdi, Valeria Batman, MD  polyethylene glycol (MIRALAX / GLYCOLAX) 17 g packet Take 17 g by mouth daily as needed for mild constipation. 11/12/19   Raiford Noble Latif, DO  potassium chloride SA (KLOR-CON) 20 MEQ tablet Take 1 tablet (20 mEq total) by mouth 2 (two) times daily. 12/01/19   Dorie Rank, MD  Probiotic Product (ALIGN) 4 MG CAPS Take 4  mg by mouth at bedtime.     [provider]  sucralfate (CARAFATE) 1 GM/10ML suspension Take 10 mLs (1 g total) by mouth 3 (three) times daily as needed. 11/23/19 12/23/19  ShahmehdiValeria Batman, MD  traZODone (DESYREL) 50 MG tablet Take 0.5-1 tablets (25-50 mg total) by mouth at bedtime as needed for sleep. Patient taking differently: Take 50 mg by mouth at bedtime as needed for sleep.  10/28/18   [name], MD    Physical Exam: Vitals:   01/23/20 1130 01/23/20 1145 01/23/20 1200 01/23/20 1215  BP: (!) 103/58 (!) 103/56 (!) 141/66 120/62  Pulse: 76 70 81 72  Resp: 11 12 12 12   Temp:      TempSrc:      SpO2: 96% 97% 99% 99%    Constitutional: NAD, calm, comfortable, on 1 L of oxygen via nasal cannula, not in acute distress.  Communicating well.  Thin and lean.   Eyes: PERRL, lids and conjunctivae normal ENMT: Mucous membranes are moist. Posterior pharynx clear of any exudate or lesions.Normal dentition.  Neck: normal, supple, no  masses, no thyromegaly Respiratory: clear to auscultation bilaterally, no wheezing, no crackles. Normal respiratory effort. No accessory muscle use.  Cardiovascular: Regular rate and rhythm, no murmurs / rubs / gallops. No extremity edema. 2+ pedal pulses. No carotid bruits.  Abdomen: no tenderness, no masses palpated. No hepatosplenomegaly. Bowel sounds positive.  Musculoskeletal: no clubbing / cyanosis. No joint deformity upper and lower extremities. Good ROM, no contractures. Normal muscle tone.  Skin: no rashes, lesions, ulcers. No induration Neurologic: CN 2-12 grossly intact. Sensation intact, DTR normal. Strength 5/5 in all 4.  Psychiatric: Normal judgment and insight. Alert and oriented x 3. Normal mood.    Labs on Admission: I have personally reviewed following labs and imaging studies  CBC: Recent Labs  Lab 01/23/20 1006  WBC 9.1  NEUTROABS 6.9  HGB 8.6*  HCT 29.2*  MCV 86.1  PLT 630*   Basic Metabolic Panel: Recent Labs  Lab 01/23/20 1006  NA 138  K 4.1  CL 106  CO2 20*  GLUCOSE 90  BUN 14  CREATININE 0.90  CALCIUM 8.9   GFR: CrCl cannot be calculated (Unknown ideal weight.). Liver Function Tests: Recent Labs  Lab 01/23/20 1006  AST 19  ALT 14  ALKPHOS 88  BILITOT 0.4  PROT 5.4*  ALBUMIN 2.7*   No results for input(s): LIPASE, AMYLASE in the last 168 hours. No results for input(s): AMMONIA in the last 168 hours. Coagulation Profile: No results for input(s): INR, PROTIME in the last 168 hours. Cardiac Enzymes: No results for input(s): CKTOTAL, CKMB, CKMBINDEX, TROPONINI in the last 168 hours. BNP (last 3 results) No results for input(s): PROBNP in the last 8760 hours. HbA1C: No results for input(s): HGBA1C in the last 72 hours. CBG: No results for input(s): GLUCAP in the last 168 hours. Lipid Profile: Recent Labs    01/23/20 1006  TRIG 63   Thyroid Function Tests: No results for input(s): TSH, T4TOTAL, FREET4, T3FREE, THYROIDAB in the last  72 hours. Anemia Panel: Recent Labs    01/23/20 1006  FERRITIN 20  RETICCTPCT 1.1   Urine analysis:    Component Value Date/Time   COLORURINE YELLOW 12/01/2019 2128   APPEARANCEUR CLOUDY (A) 12/01/2019 2128   LABSPEC 1.015 12/01/2019 2128   PHURINE 6.0 12/01/2019 2128   GLUCOSEU NEGATIVE 12/01/2019 2128   HGBUR TRACE (A) 12/01/2019 2128   BILIRUBINUR NEGATIVE 12/01/2019 2128   KETONESUR  NEGATIVE 12/01/2019 2128   PROTEINUR 30 (A) 12/01/2019 2128   UROBILINOGEN 0.2 05/24/2013 1634   NITRITE POSITIVE (A) 12/01/2019 2128   LEUKOCYTESUR MODERATE (A) 12/01/2019 2128    Radiological Exams on Admission: DG Chest Port 1 View  Result Date: 01/23/2020 CLINICAL DATA:  Cough and shortness of breath EXAM: PORTABLE CHEST 1 VIEW COMPARISON:  October 26, 2019 FINDINGS: There is a focal area of atelectasis and questionable associated infiltrate in the right upper lobe. There is mild atelectasis in the bases. No airspace consolidation. There is cardiomegaly with pulmonary vascularity normal. No adenopathy. There is a focal hiatal hernia. Bones are osteoporotic. Multiple compression fractures in the thoracic and lumbar region with evidence of prior kyphoplasty procedures at several levels. IMPRESSION: Atelectasis with questionable early pneumonia right upper lobe. Atelectatic change noted in the lung bases. Heart is mildly enlarged with pulmonary vascularity normal. Hiatal hernia present. Osteoporosis, status post kyphoplasty procedures in the midthoracic and upper lumbar regions. Electronically Signed   By: Lowella Grip III M.D.   On: 01/23/2020 10:38    EKG: Independently reviewed.  Sinus rhythm, atrial premature complexes.  No ST elevation or depression noted.  Assessment/Plan Principal Problem:   Acute hypoxemic respiratory failure (HCC) Active Problems:   GERD (gastroesophageal reflux disease)   Acute on chronic anemia   Depression   Thrombocytosis (HCC)    Acute hypoxemic respiratory  failure: Secondary to right upper lobe pneumonia: -Patient presented with cough, sore throat, Covid rapid test at the facility +ve however PCR COVID-19 test is negative.  Reviewed chest x-ray.  She is afebrile with no leukocytosis.  Lactic acid: WNL. -We will admit under PUI.  Will repeat COVID-19 PCR test. -She received Rocephin and azithromycin in ED.  We will continue same antibiotics for now. -Check procalcitonin level, blood culture, urine strep antigen, urine Legionella antigen -On continuous pulse ox-we will try to wean off of oxygen as tolerated. -Monitor vitals closely.  Acute on chronic anemia: -Patient's H&H 8.6/29.2 was 10.6/32.8 68-month ago.  She denies melena, over-the-counter use of NSAIDs.  POC occult blood is negative.  Monitor H&H closely.  Iron studies and B12 and folate: Pending. -Repeat FOBT.  Transfuse as needed. -Continue PPI and Carafate. -Reviewed EGD from 07/21 which showed grade C esophagitis, hiatal hernia.  GERD/hiatal hernia/grade C esophagitis: -Reviewed recent EGD. -Continue Protonix  History of pelvic fracture/chronic lower back pain: History of lumbar compression fracture at L1-L2: No status post kyphoplasty: -On chronic pain meds.  Continue Percocet, Flexeril as needed and gabapentin at bedtime.  Chronic constipation: -Likely secondary to opioid use. -Continue MiraLAX  Thrombocytosis: Platelet: 549-chronic. -Repeat CBC tomorrow AM.  Depression/anxiety: Continue trazodone, Celexa, Ativan as needed  CKD stage IIIa: At baseline -Continue to monitor.  Please note: Patient's UA came back positive for nitrites and leukocytes.  Patient denies any urinary symptoms.  Urine culture is pending.  Continue Rocephin for now.  Follow urine culture result.  DVT prophylaxis: SCD Code Status: DNR-confirmed with the patient Family Communication: None present at bedside.  Plan of care discussed with patient in length and she verbalized understanding and agreed with  it.  I called patient's daughter [name] and discussed plan of care on the phone and she verbalized understanding.  Disposition Plan: Nursing home in 1 to 2 days Consults called: None  admission status: Inpatient   Mckinley Jewel MD [facility]  If 7PM-7AM, please contact night-coverage [email or web address] Password [facility]  01/23/2020, 12:37 PM

## 2020-01-24 ENCOUNTER — Encounter (HOSPITAL_COMMUNITY): Payer: Self-pay | Admitting: Internal Medicine

## 2020-01-24 DIAGNOSIS — J9601 Acute respiratory failure with hypoxia: Secondary | ICD-10-CM | POA: Diagnosis present

## 2020-01-24 DIAGNOSIS — J9621 Acute and chronic respiratory failure with hypoxia: Secondary | ICD-10-CM | POA: Diagnosis present

## 2020-01-24 DIAGNOSIS — D649 Anemia, unspecified: Secondary | ICD-10-CM

## 2020-01-24 DIAGNOSIS — F329 Major depressive disorder, single episode, unspecified: Secondary | ICD-10-CM

## 2020-01-24 DIAGNOSIS — K219 Gastro-esophageal reflux disease without esophagitis: Secondary | ICD-10-CM

## 2020-01-24 LAB — CBC
HCT: 29.7 % — ABNORMAL LOW (ref 36.0–46.0)
Hemoglobin: 8.6 g/dL — ABNORMAL LOW (ref 12.0–15.0)
MCH: 24.6 pg — ABNORMAL LOW (ref 26.0–34.0)
MCHC: 29 g/dL — ABNORMAL LOW (ref 30.0–36.0)
MCV: 85.1 fL (ref 80.0–100.0)
Platelets: 552 10*3/uL — ABNORMAL HIGH (ref 150–400)
RBC: 3.49 MIL/uL — ABNORMAL LOW (ref 3.87–5.11)
RDW: 14.8 % (ref 11.5–15.5)
WBC: 9.1 10*3/uL (ref 4.0–10.5)
nRBC: 0 % (ref 0.0–0.2)

## 2020-01-24 LAB — COMPREHENSIVE METABOLIC PANEL
ALT: 11 U/L (ref 0–44)
AST: 18 U/L (ref 15–41)
Albumin: 2.4 g/dL — ABNORMAL LOW (ref 3.5–5.0)
Alkaline Phosphatase: 76 U/L (ref 38–126)
Anion gap: 10 (ref 5–15)
BUN: 10 mg/dL (ref 8–23)
CO2: 22 mmol/L (ref 22–32)
Calcium: 8.9 mg/dL (ref 8.9–10.3)
Chloride: 105 mmol/L (ref 98–111)
Creatinine, Ser: 0.78 mg/dL (ref 0.44–1.00)
GFR calc Af Amer: >60 mL/min (ref >60)
GFR calc non Af Amer: >60 mL/min (ref >60)
Glucose, Bld: 85 mg/dL (ref 70–99)
Potassium: 3.6 mmol/L (ref 3.5–5.1)
Sodium: 137 mmol/L (ref 135–145)
Total Bilirubin: 0.6 mg/dL (ref 0.3–1.2)
Total Protein: 5.1 g/dL — ABNORMAL LOW (ref 6.5–8.1)

## 2020-01-24 LAB — BLOOD CULTURE ID PANEL (REFLEXED) - BCID2

## 2020-01-24 LAB — RESPIRATORY PANEL BY PCR

## 2020-01-24 LAB — URINE CULTURE

## 2020-01-24 LAB — SARS CORONAVIRUS 2 BY RT PCR (HOSPITAL ORDER, PERFORMED IN ~~LOC~~ HOSPITAL LAB): SARS Coronavirus 2: NEGATIVE

## 2020-01-24 LAB — TROPONIN I (HIGH SENSITIVITY): Troponin I (High Sensitivity): 9 ng/L (ref <18)

## 2020-01-24 MED ORDER — SODIUM CHLORIDE 0.9 % IV SOLN
1.0000 g | INTRAVENOUS | Status: DC
  Administered 2020-01-25 – 2020-01-26 (×2): 1 g via INTRAVENOUS
  Filled 2020-01-24: qty 1
  Filled 2020-01-24: qty 10
  Filled 2020-01-24: qty 1

## 2020-01-24 MED ORDER — INFLUENZA VAC A&B SA ADJ QUAD 0.5 ML IM PRSY
0.5000 mL | PREFILLED_SYRINGE | INTRAMUSCULAR | Status: DC
  Filled 2020-01-24: qty 0.5

## 2020-01-24 MED ORDER — SODIUM CHLORIDE 0.9 % IV SOLN
500.0000 mg | INTRAVENOUS | Status: DC
  Administered 2020-01-25: 500 mg via INTRAVENOUS
  Filled 2020-01-24 (×2): qty 500

## 2020-01-24 MED ORDER — ENOXAPARIN SODIUM 40 MG/0.4ML ~~LOC~~ SOLN
40.0000 mg | SUBCUTANEOUS | Status: DC
  Administered 2020-01-24 – 2020-01-26 (×3): 40 mg via SUBCUTANEOUS
  Filled 2020-01-24 (×3): qty 0.4

## 2020-01-24 NOTE — Discharge Planning (Signed)
RNCM received call from [facility] Philippa Sicks [phone]) regarding disposition plan.  RNCM notified Rollene Fare that pt has orders for admission at this time and care team will notify her when pt is ready to return to [facility].

## 2020-01-24 NOTE — Progress Notes (Signed)
Patient arrived to room 2w18 from ED.  Assessment complete, VS obtained, and Admission database began.

## 2020-01-24 NOTE — ED Notes (Signed)
Pt called out stating her arm was hurting really bad. This RN came in to check on pt and found pt's left posterior forearm was red. No swelling was noted but pt was getting Zithromax. This RN stopped this Zithromax and alerted pharmacy to see if anything needed to be done. This RN also asked the secretary to page the admitting doctor as well. This RN will continue to monitor.

## 2020-01-24 NOTE — Progress Notes (Signed)
PHARMACY - PHYSICIAN COMMUNICATION CRITICAL VALUE ALERT - BLOOD CULTURE IDENTIFICATION (BCID)  Katrina Burnett is an 86 y.o. female who presented to [facility] on 01/23/2020 with a chief complaint of CAP  Assessment:  Pt growing stap epidermidis and staph lugdunensis in 1/4 blood culture bottles - likely contaminant  Name of physician (or Provider) Contacted: Dr. Cyd Silence  Current antibiotics: Azithromycin and Rocephin  Changes to prescribed antibiotics recommended:  No additional abx at this time. Will continue to f/u cultures  Results for orders placed or performed during the hospital encounter of 01/23/20  Blood Culture ID Panel (Reflexed) (Collected: 01/23/2020 10:17 AM)  Result Value Ref Range   Enterococcus faecalis NOT DETECTED NOT DETECTED   Enterococcus Faecium NOT DETECTED NOT DETECTED   Listeria monocytogenes NOT DETECTED NOT DETECTED   Staphylococcus species DETECTED (A) NOT DETECTED   Staphylococcus aureus (BCID) NOT DETECTED NOT DETECTED   Staphylococcus epidermidis DETECTED (A) NOT DETECTED   Staphylococcus lugdunensis DETECTED (A) NOT DETECTED   Streptococcus species NOT DETECTED NOT DETECTED   Streptococcus agalactiae NOT DETECTED NOT DETECTED   Streptococcus pneumoniae NOT DETECTED NOT DETECTED   Streptococcus pyogenes NOT DETECTED NOT DETECTED   A.calcoaceticus-baumannii NOT DETECTED NOT DETECTED   Bacteroides fragilis NOT DETECTED NOT DETECTED   Enterobacterales NOT DETECTED NOT DETECTED   Enterobacter cloacae complex NOT DETECTED NOT DETECTED   Escherichia coli NOT DETECTED NOT DETECTED   Klebsiella aerogenes NOT DETECTED NOT DETECTED   Klebsiella oxytoca NOT DETECTED NOT DETECTED   Klebsiella pneumoniae NOT DETECTED NOT DETECTED   Proteus species NOT DETECTED NOT DETECTED   Salmonella species NOT DETECTED NOT DETECTED   Serratia marcescens NOT DETECTED NOT DETECTED   Haemophilus influenzae NOT DETECTED NOT DETECTED   Neisseria meningitidis NOT DETECTED NOT  DETECTED   Pseudomonas aeruginosa NOT DETECTED NOT DETECTED   Stenotrophomonas maltophilia NOT DETECTED NOT DETECTED   Candida albicans NOT DETECTED NOT DETECTED   Candida auris NOT DETECTED NOT DETECTED   Candida glabrata NOT DETECTED NOT DETECTED   Candida krusei NOT DETECTED NOT DETECTED   Candida parapsilosis NOT DETECTED NOT DETECTED   Candida tropicalis NOT DETECTED NOT DETECTED   Cryptococcus neoformans/gattii NOT DETECTED NOT DETECTED   Methicillin resistance mecA/C DETECTED (A) NOT DETECTED    Sherlon Handing, PharmD, BCPS Please see amion for complete clinical pharmacist phone list 01/24/2020  2:28 AM

## 2020-01-24 NOTE — Progress Notes (Signed)
PROGRESS NOTE        PATIENT DETAILS Name: Katrina Burnett Age: 86 y.o. Sex: female Date of Birth: 1924/10/22 Admit Date: 01/23/2020 Admitting Physician Vernelle Emerald, MD PCP:Kim, Jeneen Rinks, MD  Brief Narrative: Patient is a 86 y.o. female chronic lower back pain, CKD stage IIIa, diverticulosis, depression/anxiety-resident of a local independent living facility-presenting to the ED on 9/15 after not feeling well, some cough and subjective fever.  Her Covid antigen test in the independent living facility was positive-however Covid PCR x2 on admission was negative.  Significant events: 9/15>> admit to [facility] complaining of not feeling well, cough, sore throat-tested positive for COVID-19 at independent living facility.  COVID-19 PCR x2 - on admission  COVID-19 vaccination status: Fully vaccinated  Significant studies: 9/15>> chest x-ray: Questionable early pneumonia in the right upper lobe.   Antimicrobial therapy: Rocephin: 9/15>> Zithromax: 9/15>>  Microbiology data: 9/15>> blood culture: 1/2 coag negative staph (prelim) 9/15>> urine culture: Pending  Procedures : None  Consults: None  DVT Prophylaxis : SCDs Start: 01/23/20 1226 Prophylactic Lovenox   Subjective: Asking to go home-claims she does not want to spend another night in the emergency room.  Some mild cough-but claims she feels better than yesterday.  Assessment/Plan: Acute hypoxic respiratory failure secondary to community-acquired pneumonia: Initial concern for COVID-19 related pneumonia-however COVID-19 PCR x2 neg.  Suspect she had a false positive antigen test at her facility for COVID-19.  Appears to have very mild hypoxia-on 1-2 L of oxygen this morning-she appears comfortable-plans are to treat this as a bacterial pneumonia with Rocephin/Zithromax.  We will go out and send out a respiratory virus panel.  Normocytic anemia: Likely iron deficiency anemia-denies any recent history  of melena/hematochezia.  Stool FOBT negative.  No indication for transfusion-had severe esophagitis in EGD done on July 2021.  Since not actively bleeding-suspect does not require any further endoscopic evaluation.  Start iron supplementation and follow CBC.  CKD stage IIIa: Creatinine close to baseline-follow  Recent history of esophagitis: Remains on PPI  ??Recent pelvic fracture: Apparently continues to have pain-being followed by orthopedics-we will reach out to orthopedics to see if anything else needs to be done while she is in the hospital  History of chronic low back pain/right hip pain/pelvic pain: Chronic issue-s/p right hip replacement/kyphoplasty-continue usual narcotic regimen-Flexeril and Neurontin and other supportive measures.    Constipation: Continue MiraLAX  Depression/anxiety: Continue trazodone, Celexa and as needed Ativan.   Diet: Diet Order            Diet regular Room service appropriate? Yes; Fluid consistency: Thin  Diet effective now                  Code Status: DNR  Family Communication: [id number]  Disposition Plan: Status is: Inpatient  Remains inpatient appropriate because:Inpatient level of care appropriate due to severity of illness  Dispo: The patient is from: Home              Anticipated d/c is to: Home              Anticipated d/c date is: 2 days              Patient currently is not medically stable to d/c.  Barriers to Discharge: Pneumonia with hypoxia requiring IV antibiotics  Antimicrobial agents: Anti-infectives (From admission, onward)  Start     Dose/Rate Route Frequency Ordered Stop   01/24/20 1300  cefTRIAXone (ROCEPHIN) 1 g in sodium chloride 0.9 % 100 mL IVPB        1 g 200 mL/hr over 30 Minutes Intravenous Every 24 hours 01/23/20 1308     01/24/20 1300  azithromycin (ZITHROMAX) 500 mg in sodium chloride 0.9 % 250 mL IVPB        500 mg 250 mL/hr over 60 Minutes Intravenous Every 24 hours 01/23/20  1308     01/23/20 1215  cefTRIAXone (ROCEPHIN) 1 g in sodium chloride 0.9 % 100 mL IVPB        1 g 200 mL/hr over 30 Minutes Intravenous  Once 01/23/20 1205 01/23/20 1343   01/23/20 1215  azithromycin (ZITHROMAX) 500 mg in sodium chloride 0.9 % 250 mL IVPB        500 mg 250 mL/hr over 60 Minutes Intravenous  Once 01/23/20 1205 01/23/20 1525       Time spent: 25- minutes-Greater than 50% of this time was spent in counseling, explanation of diagnosis, planning of further management, and coordination of care.  MEDICATIONS: Scheduled Meds: . citalopram  40 mg Oral Daily  . gabapentin  600 mg Oral QHS  . lactobacillus acidophilus  1 tablet Oral QHS  . pantoprazole  40 mg Oral Daily  . polyethylene glycol  17 g Oral Daily  . traZODone  50 mg Oral QHS   Continuous Infusions: . azithromycin    . cefTRIAXone (ROCEPHIN)  IV     PRN Meds:.acetaminophen **OR** acetaminophen, cyclobenzaprine, LORazepam, ondansetron **OR** ondansetron (ZOFRAN) IV, oxyCODONE-acetaminophen **AND** oxyCODONE   PHYSICAL EXAM: Vital signs: Vitals:   01/24/20 0400 01/24/20 0500 01/24/20 0600 01/24/20 0700  BP: (!) 144/63 (!) 143/73 120/74 (!) 135/106  Pulse: 78 88 82 87  Resp: 15 12 (!) 21 19  Temp:      TempSrc:      SpO2: 95% 93% 90% 91%   There were no vitals filed for this visit. There is no height or weight on file to calculate BMI.   Gen Exam:Alert awake-not in any distress HEENT:atraumatic, normocephalic Chest: B/L clear to auscultation anteriorly CVS:S1S2 regular Abdomen:soft non tender, non distended Extremities:no edema Neurology: Non focal Skin: no rash  I have personally reviewed following labs and imaging studies  LABORATORY DATA: CBC: Recent Labs  Lab 01/23/20 1006 01/24/20 0232  WBC 9.1 9.1  NEUTROABS 6.9  --   HGB 8.6* 8.6*  HCT 29.2* 29.7*  MCV 86.1 85.1  PLT 549* 552*    Basic Metabolic Panel: Recent Labs  Lab 01/23/20 1006 01/23/20 1500 01/24/20 0232  NA 138   --  137  K 4.1  --  3.6  CL 106  --  105  CO2 20*  --  22  GLUCOSE 90  --  85  BUN 14  --  10  CREATININE 0.90  --  0.78  CALCIUM 8.9  --  8.9  MG  --  1.9  --   PHOS  --  3.1  --     GFR: CrCl cannot be calculated (Unknown ideal weight.).  Liver Function Tests: Recent Labs  Lab 01/23/20 1006 01/24/20 0232  AST 19 18  ALT 14 11  ALKPHOS 88 76  BILITOT 0.4 0.6  PROT 5.4* 5.1*  ALBUMIN 2.7* 2.4*   No results for input(s): LIPASE, AMYLASE in the last 168 hours. No results for input(s): AMMONIA in the last 168 hours.  Coagulation  Profile: No results for input(s): INR, PROTIME in the last 168 hours.  Cardiac Enzymes: No results for input(s): CKTOTAL, CKMB, CKMBINDEX, TROPONINI in the last 168 hours.  BNP (last 3 results) No results for input(s): PROBNP in the last 8760 hours.  Lipid Profile: Recent Labs    01/23/20 1006  TRIG 63    Thyroid Function Tests: Recent Labs    01/23/20 1500  TSH 1.293    Anemia Panel: Recent Labs    01/23/20 1006  VITAMINB12 1,402*  FOLATE 54.3  FERRITIN 20  TIBC 407  IRON 9*  RETICCTPCT 1.1    Urine analysis:    Component Value Date/Time   COLORURINE YELLOW 01/23/2020 1522   APPEARANCEUR CLEAR 01/23/2020 1522   LABSPEC 1.012 01/23/2020 1522   PHURINE 5.0 01/23/2020 1522   GLUCOSEU NEGATIVE 01/23/2020 1522   HGBUR NEGATIVE 01/23/2020 1522   BILIRUBINUR NEGATIVE 01/23/2020 1522   KETONESUR NEGATIVE 01/23/2020 1522   PROTEINUR NEGATIVE 01/23/2020 1522   UROBILINOGEN 0.2 05/24/2013 1634   NITRITE POSITIVE (A) 01/23/2020 1522   LEUKOCYTESUR SMALL (A) 01/23/2020 1522    Sepsis Labs: Lactic Acid, Venous    Component Value Date/Time   LATICACIDVEN 0.7 01/23/2020 1447    MICROBIOLOGY: Recent Results (from the past 240 hour(s))  SARS Coronavirus 2 by RT PCR (hospital order, performed in [facility] hospital lab) Nasopharyngeal Nasopharyngeal Swab     Status: None   Collection Time: 01/23/20 10:06 AM   Specimen:  Nasopharyngeal Swab  Result Value Ref Range Status   SARS Coronavirus 2 NEGATIVE NEGATIVE Final    Comment: (NOTE) SARS-CoV-2 target nucleic acids are NOT DETECTED.  The SARS-CoV-2 RNA is generally detectable in upper and lower respiratory specimens during the acute phase of infection. The lowest concentration of SARS-CoV-2 viral copies this assay can detect is 250 copies / mL. A negative result does not preclude SARS-CoV-2 infection and should not be used as the sole basis for treatment or other patient management decisions.  A negative result may occur with improper specimen collection / handling, submission of specimen other than nasopharyngeal swab, presence of viral mutation(s) within the areas targeted by this assay, and inadequate number of viral copies (<250 copies / mL). A negative result must be combined with clinical observations, patient history, and epidemiological information.  Fact Sheet for Patients:   StrictlyIdeas.no  Fact Sheet for Healthcare Providers: BankingDealers.co.za  This test is not yet approved or  cleared by the [place] FDA and has been authorized for detection and/or diagnosis of SARS-CoV-2 by FDA under an Emergency Use Authorization (EUA).  This EUA will remain in effect (meaning this test can be used) for the duration of the COVID-19 declaration under Section 564(b)(1) of the Act, 21 U.S.C. section 360bbb-3(b)(1), unless the authorization is terminated or revoked sooner.  Performed at [facility], [place] [address], [place], [place] [address]   Blood Culture (routine x 2)     Status: None (Preliminary result)   Collection Time: 01/23/20 10:17 AM   Specimen: BLOOD RIGHT ARM  Result Value Ref Range Status   Specimen Description BLOOD RIGHT ARM  Final   Special Requests   Final    BOTTLES DRAWN AEROBIC AND ANAEROBIC Blood Culture results may not be optimal due to an inadequate volume of  blood received in culture bottles   Culture  Setup Time   Final    GRAM POSITIVE COCCI IN BOTH AEROBIC AND ANAEROBIC BOTTLES Organism ID to follow CRITICAL RESULT CALLED TO,  READ BACK BY AND VERIFIED WITH: K AMEND PHARMD 01/24/20 0221 JDW    Culture   Final    GRAM POSITIVE COCCI CULTURE REINCUBATED FOR BETTER GROWTH Performed at [facility], [place] [address] [place], [place] [address]    Report Status PENDING  Incomplete  Blood Culture ID Panel (Reflexed)     Status: Abnormal   Collection Time: 01/23/20 10:17 AM  Result Value Ref Range Status   Enterococcus faecalis NOT DETECTED NOT DETECTED Final   Enterococcus Faecium NOT DETECTED NOT DETECTED Final   Listeria monocytogenes NOT DETECTED NOT DETECTED Final   Staphylococcus species DETECTED (A) NOT DETECTED Final    Comment: CRITICAL RESULT CALLED TO, READ BACK BY AND VERIFIED WITH:  K AMEND PHARMD 01/24/20 0221 JDW    Staphylococcus aureus (BCID) NOT DETECTED NOT DETECTED Final   Staphylococcus epidermidis DETECTED (A) NOT DETECTED Final    Comment: Methicillin (oxacillin) resistant coagulase negative staphylococcus. Possible blood culture contaminant (unless isolated from more than one blood culture draw or clinical case suggests pathogenicity). No antibiotic treatment is indicated for blood  culture contaminants. CRITICAL RESULT CALLED TO, READ BACK BY AND VERIFIED WITH: K AMEND PHARMD 01/24/20 0221 JDW    Staphylococcus lugdunensis DETECTED (A) NOT DETECTED Final    Comment: Methicillin (oxacillin) resistant coagulase negative staphylococcus. Possible blood culture contaminant (unless isolated from more than one blood culture draw or clinical case suggests pathogenicity). No antibiotic treatment is indicated for blood  culture contaminants. CRITICAL RESULT CALLED TO, READ BACK BY AND VERIFIED WITH: K AMEND PHARMD 01/24/20 0221 JDW    Streptococcus species NOT DETECTED NOT DETECTED Final   Streptococcus agalactiae NOT  DETECTED NOT DETECTED Final   Streptococcus pneumoniae NOT DETECTED NOT DETECTED Final   Streptococcus pyogenes NOT DETECTED NOT DETECTED Final   A.calcoaceticus-baumannii NOT DETECTED NOT DETECTED Final   Bacteroides fragilis NOT DETECTED NOT DETECTED Final   Enterobacterales NOT DETECTED NOT DETECTED Final   Enterobacter cloacae complex NOT DETECTED NOT DETECTED Final   Escherichia coli NOT DETECTED NOT DETECTED Final   Klebsiella aerogenes NOT DETECTED NOT DETECTED Final   Klebsiella oxytoca NOT DETECTED NOT DETECTED Final   Klebsiella pneumoniae NOT DETECTED NOT DETECTED Final   Proteus species NOT DETECTED NOT DETECTED Final   Salmonella species NOT DETECTED NOT DETECTED Final   Serratia marcescens NOT DETECTED NOT DETECTED Final   Haemophilus influenzae NOT DETECTED NOT DETECTED Final   Neisseria meningitidis NOT DETECTED NOT DETECTED Final   Pseudomonas aeruginosa NOT DETECTED NOT DETECTED Final   Stenotrophomonas maltophilia NOT DETECTED NOT DETECTED Final   Candida albicans NOT DETECTED NOT DETECTED Final   Candida auris NOT DETECTED NOT DETECTED Final   Candida glabrata NOT DETECTED NOT DETECTED Final   Candida krusei NOT DETECTED NOT DETECTED Final   Candida parapsilosis NOT DETECTED NOT DETECTED Final   Candida tropicalis NOT DETECTED NOT DETECTED Final   Cryptococcus neoformans/gattii NOT DETECTED NOT DETECTED Final   Methicillin resistance mecA/C DETECTED (A) NOT DETECTED Final    Comment: CRITICAL RESULT CALLED TO, READ BACK BY AND VERIFIED WITH: K AMEND [facility] 01/24/20 0221 JDW Performed at [facility], [address] [place],  [address]   SARS Coronavirus 2 by RT PCR (hospital order, performed in [facility] hospital lab) Nasopharyngeal Nasopharyngeal Swab     Status: None   Collection Time: 01/24/20  2:31 AM   Specimen: Nasopharyngeal Swab  Result Value Ref Range Status   SARS Coronavirus 2 NEGATIVE NEGATIVE  Final    Comment: (NOTE) SARS-CoV-2  target nucleic acids are NOT DETECTED.  The SARS-CoV-2 RNA is generally detectable in upper and lower respiratory specimens during the acute phase of infection. The lowest concentration of SARS-CoV-2 viral copies this assay can detect is 250 copies / mL. A negative result does not preclude SARS-CoV-2 infection and should not be used as the sole basis for treatment or other patient management decisions.  A negative result may occur with improper specimen collection / handling, submission of specimen other than nasopharyngeal swab, presence of viral mutation(s) within the areas targeted by this assay, and inadequate number of viral copies (<250 copies / mL). A negative result must be combined with clinical observations, patient history, and epidemiological information.  Fact Sheet for Patients:   StrictlyIdeas.no  Fact Sheet for Healthcare Providers: BankingDealers.co.za  This test is not yet approved or  cleared by the [place] FDA and has been authorized for detection and/or diagnosis of SARS-CoV-2 by FDA under an Emergency Use Authorization (EUA).  This EUA will remain in effect (meaning this test can be used) for the duration of the COVID-19 declaration under Section 564(b)(1) of the Act, 21 U.S.C. section 360bbb-3(b)(1), unless the authorization is terminated or revoked sooner.  Performed at [facility], [place] [address] [place], [place] [address]     RADIOLOGY STUDIES/RESULTS: DG Chest Port 1 View  Result Date: 01/23/2020 CLINICAL DATA:  Cough and shortness of breath EXAM: PORTABLE CHEST 1 VIEW COMPARISON:  October 26, 2019 FINDINGS: There is a focal area of atelectasis and questionable associated infiltrate in the right upper lobe. There is mild atelectasis in the bases. No airspace consolidation. There is cardiomegaly with pulmonary vascularity normal. No adenopathy. There is a focal hiatal hernia. Bones are osteoporotic.  Multiple compression fractures in the thoracic and lumbar region with evidence of prior kyphoplasty procedures at several levels. IMPRESSION: Atelectasis with questionable early pneumonia right upper lobe. Atelectatic change noted in the lung bases. Heart is mildly enlarged with pulmonary vascularity normal. Hiatal hernia present. Osteoporosis, status post kyphoplasty procedures in the midthoracic and upper lumbar regions. Electronically Signed   By: Lowella Grip III M.D.   On: 01/23/2020 10:38     LOS: 1 day   Oren Binet, MD  [facility]    To contact the attending provider between 7A-7P or the covering provider during after hours 7P-7A, please log into the web site [email or web address] and access using universal [place] password for that web site. If you do not have the password, please call the hospital operator.  01/24/2020, 12:00 PM

## 2020-01-24 NOTE — Progress Notes (Signed)
HOSPITAL MEDICINE OVERNIGHT EVENT NOTE    Notified by nursing patient experienced severe LUE pain after initiation of Azithromycin this evening associated with redness and warmth of the affected extremity.  I came to evaluate the patient at the bedside.  Prior to my arrival, LUE IV has already been removed however there continues to be residual redness and warmth at the site.  Chart reviewed.  Patient has an extensive allergy list however with the patient tolerating Azithromycin yesterday I doubt an allergic reaction.   Will advise nursing to apply cold compresses to the LUE, insert an IV in the RUE instead and resume IV abx for now.  Will monitor for any further event.  Vernelle Emerald  MD [facility]

## 2020-01-25 ENCOUNTER — Inpatient Hospital Stay (HOSPITAL_COMMUNITY): Payer: Medicare Other

## 2020-01-25 LAB — CBC
HCT: 26.9 % — ABNORMAL LOW (ref 36.0–46.0)
Hemoglobin: 8 g/dL — ABNORMAL LOW (ref 12.0–15.0)
MCH: 23.8 pg — ABNORMAL LOW (ref 26.0–34.0)
MCHC: 29.7 g/dL — ABNORMAL LOW (ref 30.0–36.0)
MCV: 80.1 fL (ref 80.0–100.0)
Platelets: 587 10*3/uL — ABNORMAL HIGH (ref 150–400)
RBC: 3.36 MIL/uL — ABNORMAL LOW (ref 3.87–5.11)
RDW: 14.8 % (ref 11.5–15.5)
WBC: 13.7 10*3/uL — ABNORMAL HIGH (ref 4.0–10.5)
nRBC: 0 % (ref 0.0–0.2)

## 2020-01-25 LAB — BASIC METABOLIC PANEL
Anion gap: 12 (ref 5–15)
BUN: 11 mg/dL (ref 8–23)
CO2: 22 mmol/L (ref 22–32)
Calcium: 9.1 mg/dL (ref 8.9–10.3)
Chloride: 103 mmol/L (ref 98–111)
Creatinine, Ser: 0.77 mg/dL (ref 0.44–1.00)
GFR calc Af Amer: >60 mL/min (ref >60)
GFR calc non Af Amer: >60 mL/min (ref >60)
Glucose, Bld: 101 mg/dL — ABNORMAL HIGH (ref 70–99)
Potassium: 3.3 mmol/L — ABNORMAL LOW (ref 3.5–5.1)
Sodium: 137 mmol/L (ref 135–145)

## 2020-01-25 LAB — LEGIONELLA PNEUMOPHILA SEROGP 1 UR AG: L. pneumophila Serogp 1 Ur Ag: NEGATIVE

## 2020-01-25 LAB — CULTURE, BLOOD (ROUTINE X 2)

## 2020-01-25 MED ORDER — FERROUS SULFATE 325 (65 FE) MG PO TABS
325.0000 mg | ORAL_TABLET | Freq: Two times a day (BID) | ORAL | Status: DC
  Administered 2020-01-25 – 2020-01-27 (×4): 325 mg via ORAL
  Filled 2020-01-25 (×4): qty 1

## 2020-01-25 NOTE — TOC Initial Note (Signed)
Transition of Care [facility]) - Initial/Assessment Note    Patient Details  Name: Katrina Burnett MRN: [id number] Date of Birth: 05-09-1925  Transition of Care [facility]) CM/SW Contact:    Joanne Chars, LCSW Phone Number: 01/25/2020, 2:02 PM  Clinical Narrative:   CSW spoke with pt regarding return to [facility] with recommendation for [facility].  Pt lives in independent living apartment, has [employer] aide in place.  Pt has walker, no other equipment needs identified.  Pt is vaccinated.  Permission given to speak with daughter, [name].   MD has spoken to pt daughter about DC tomorrow and put in [facility] orders.  No other needs identified.              Expected Discharge Plan: Assisted Living Barriers to Discharge: Continued Medical Work up   Patient Goals and CMS Choice Patient states their goals for this hospitalization and ongoing recovery are:: "I'm going to wait and see"      Expected Discharge Plan and Services Expected Discharge Plan: Assisted Living     Post Acute Care Choice: [place] arrangements for the past 2 months: [place] ([facility])                 DME Arranged: N/A         HH Arranged: PT, OT HH Agency: Other - See comment ([facility] to provide [place] OT/PT) Date HH Agency Contacted: 01/25/20 Time HH Agency Contacted: 1400 Representative spoke with at [facility]: Philippa Sicks  Prior Living Arrangements/Services Living arrangements for the past 2 months: [place] ([place]) Lives with:: Facility Resident Patient language and need for interpreter reviewed:: Yes Do you feel safe going back to the place where you live?: Yes      Need for Family Participation in Patient Care: No (Comment) Care giver support system in place?: Yes (comment) Current home services: Homehealth aide Criminal Activity/Legal Involvement Pertinent to Current Situation/Hospitalization: No - Comment as needed  Activities of Daily Living Home Assistive  Devices/Equipment: Environmental consultant (specify type) ADL Screening (condition at time of admission) Patient's cognitive ability adequate to safely complete daily activities?: Yes Is the patient deaf or have difficulty hearing?: No Does the patient have difficulty seeing, even when wearing glasses/contacts?: No Does the patient have difficulty concentrating, remembering, or making decisions?: No Patient able to express need for assistance with ADLs?: Yes Does the patient have difficulty dressing or bathing?: Yes Independently performs ADLs?: No Communication: Independent Dressing (OT): Needs assistance Is this a change from baseline?: Pre-admission baseline Grooming: Needs assistance Is this a change from baseline?: Pre-admission baseline Feeding: Independent Bathing: Needs assistance Is this a change from baseline?: Pre-admission baseline Toileting: Needs assistance Is this a change from baseline?: Pre-admission baseline In/Out Bed: Needs assistance Is this a change from baseline?: Pre-admission baseline Walks in Home: Needs assistance Is this a change from baseline?: Pre-admission baseline Does the patient have difficulty walking or climbing stairs?: Yes Weakness of Legs: Both Weakness of Arms/Hands: None  Permission Sought/Granted Permission sought to share information with : Facility Sport and exercise psychologist, Family Supports Permission granted to share information with : Yes, Verbal Permission Granted  Share Information with NAME: [name], daughter  Permission granted to share info w AGENCY: [facility]        Emotional Assessment Appearance:: Appears stated age Attitude/Demeanor/Rapport: Engaged Affect (typically observed): Pleasant Orientation: : Oriented to Self, Oriented to Place, Oriented to  Time, Oriented to Situation Alcohol / Substance Use: Not Applicable Psych Involvement: No (comment)  Admission  diagnosis:  SOB (shortness of breath) [R06.02] Acute respiratory failure with  hypoxia (HCC) [J96.01] Acute hypoxemic respiratory failure (HCC) [J96.01] Pneumonia of right upper lobe due to infectious organism [J18.9] Patient Active Problem List   Diagnosis Date Noted  . Acute respiratory failure with hypoxia ([place]) 01/24/2020  . Acute hypoxemic respiratory failure ([place]) 01/23/2020  . Depression   . Thrombocytosis ([place])   . GIB (gastrointestinal bleeding) 11/21/2019  . Esophageal mass 11/19/2019  . Hiatal hernia   . Esophagitis   . Pelvic fracture ([place]) 11/11/2019  . Pneumothorax on left 10/24/2018  . Pneumothorax, left 10/24/2018  . Acute on chronic anemia 11/25/2016  . Hyponatremia 11/25/2016  . Fracture, intertrochanteric, right femur ([place]) 05/26/2015  . Closed fracture of femur ([place])   . Acute blood loss anemia   . Adjustment disorder with mixed anxiety and depressed mood   . Fall   . Abnormality of gait   . Femur fracture, right, closed, initial encounter 05/23/2015  . Fracture of femoral neck, right, closed ([place]) 05/22/2015  . Femoral neck fracture, right, closed, initial encounter 05/22/2015  . Syncope 05/30/2012  . Nausea & vomiting 05/30/2012  . Chest pain 05/30/2012  . Hypotension 05/30/2012  . GERD (gastroesophageal reflux disease) 12/16/2011  . Lumbar compression fracture ([place])   . Polymyalgia ([place])    PCP:  Jani Gravel, MD Pharmacy:   [place], [place] [facility] [address] [place] [address] Phone: [phone] Fax: [phone]  [id number] - [place], [place]. AT [address] [place] [address] Phone: [phone] Fax: [phone]     Social Determinants of Health (SDOH) Interventions    Readmission Risk Interventions No flowsheet data found.

## 2020-01-25 NOTE — Evaluation (Signed)
Occupational Therapy Evaluation Patient Details Name: Katrina Burnett MRN: [id number] DOB: December 11, 1924 Today's Date: 01/25/2020    History of Present Illness 86 y.o. female admitted on 01/23/20 for cough and maliaise.  Pt intially with rapid (+) COVID test, but follow up PCR test was negative.  She is fully vaccinated.  Pt dx with R upper lobe PNA, acute on chronic anemia.  Pt with significant PMH of falls with fx (pelvic fx, lumbar compresion fx (L1/2), stroke, Barrett's esophagus, R hip ORIF, lumbar kyphoplasty.    Clinical Impression   PTA, pt was living with at [facility] and received assistance for ADLs and used RW for mobility. Pt currently requiring Min guard A for ADLs and functional mobility with RW. Pt maintaining SpO2 in 90s on RA. Pt presenting with fatigue and decreased strength. Pt would benefit from further acute OT to facilitate safe dc. Recommend dc to ALF with HHOT for further OT to optimize safety, independence with ADLs, and return to PLOF.     Follow Up Recommendations  Home health OT;Supervision/Assistance - 24 hour    Equipment Recommendations  None recommended by OT    Recommendations for Other Services PT consult     Precautions / Restrictions Precautions Precautions: Fall Precaution Comments: h/o falls with fx      Mobility Bed Mobility Overal bed mobility: Needs Assistance Bed Mobility: Supine to Sit     Supine to sit: Min guard     General bed mobility comments: Min guard assist for safety, to prevent posterior LOB with light touch.   Transfers Overall transfer level: Needs assistance Equipment used: Rolling walker (2 wheeled) Transfers: Sit to/from Stand Sit to Stand: Min guard         General transfer comment: Min guard assist for safety.     Balance Overall balance assessment: Needs assistance Sitting-balance support: Feet supported;No upper extremity supported Sitting balance-Leahy Scale: Fair Sitting balance - Comments: close  supervision EOB, not challenged Postural control: Posterior lean Standing balance support: Bilateral upper extremity supported Standing balance-Leahy Scale: Poor Standing balance comment: needs light external support and is reliant on RW for balance in standing.                            ADL either performed or assessed with clinical judgement   ADL Overall ADL's : Needs assistance/impaired Eating/Feeding: Set up;Sitting   Grooming: Brushing hair;Min guard;Standing   Upper Body Bathing: Set up;Supervision/ safety;Sitting   Lower Body Bathing: Min guard;Sit to/from stand   Upper Body Dressing : Supervision/safety;Set up;Sitting   Lower Body Dressing: Min guard;Sit to/from stand   Toilet Transfer: Min guard;Ambulation;RW (simulated to recliner)           Functional mobility during ADLs: Min guard;Rolling walker General ADL Comments: Pt presenting with decreased balance, safety awareness, and activity tolerance. motivated to participate     Vision         Perception     Praxis      Pertinent Vitals/Pain Pain Assessment: No/denies pain     Hand Dominance Right   Extremity/Trunk Assessment Upper Extremity Assessment Upper Extremity Assessment: Generalized weakness   Lower Extremity Assessment Lower Extremity Assessment: Defer to PT evaluation   Cervical / Trunk Assessment Cervical / Trunk Assessment: Kyphotic;Other exceptions Cervical / Trunk Exceptions: h/o lumbar compression fxs and kyphoplasty   Communication Communication Communication: No difficulties   Cognition Arousal/Alertness: Awake/alert Behavior During Therapy: WFL for tasks assessed/performed Overall Cognitive Status:  No family/caregiver present to determine baseline cognitive functioning                                 General Comments: Pt talkative and making conversation. Feel like she is close to baseline. However, unsure of baseline cognition   General Comments   SpO2 >92% on RA    Exercises     Shoulder Instructions      Home Living Family/patient expects to be discharged to:: Assisted living                                 Additional Comments: [place]      Prior Functioning/Environment Level of Independence: Independent with assistive device(s)        Comments: reports using a walker, having help bathing 3 days per week and being active with therapy at ALF        OT Problem List: Decreased strength;Decreased range of motion;Decreased activity tolerance;Impaired balance (sitting and/or standing);Decreased knowledge of use of DME or AE;Decreased knowledge of precautions      OT Treatment/Interventions: Self-care/ADL training;Therapeutic exercise;Energy conservation;DME and/or AE instruction;Therapeutic activities;Patient/family education    OT Goals(Current goals can be found in the care plan section) Acute Rehab OT Goals Patient Stated Goal: to return home, feel better OT Goal Formulation: With patient Time For Goal Achievement: 02/08/20 Potential to Achieve Goals: Good  OT Frequency: Min 2X/week   Barriers to D/C:            Co-evaluation              AM-PAC OT "6 Clicks" Daily Activity     Outcome Measure Help from another person eating meals?: None Help from another person taking care of personal grooming?: None Help from another person toileting, which includes using toliet, bedpan, or urinal?: A Little Help from another person bathing (including washing, rinsing, drying)?: A Little Help from another person to put on and taking off regular upper body clothing?: A Little Help from another person to put on and taking off regular lower body clothing?: A Little 6 Click Score: 20   End of Session Equipment Utilized During Treatment: Rolling walker Nurse Communication: Mobility status  Activity Tolerance: Patient tolerated treatment well Patient left: in chair;with call bell/phone within  reach;with chair alarm set  OT Visit Diagnosis: Unsteadiness on feet (R26.81);Other abnormalities of gait and mobility (R26.89);Muscle weakness (generalized) (M62.81)                Time: 0102-7253 OT Time Calculation (min): 17 min Charges:  OT General Charges $OT Visit: 1 Visit OT Evaluation $OT Eval Moderate Complexity: [place], OTR/L Acute Rehab Pager: [phone] Office: [place] 01/25/2020, 1:10 PM

## 2020-01-25 NOTE — Progress Notes (Signed)
SATURATION QUALIFICATIONS: (This note is used to comply with regulatory documentation for home oxygen)  Patient Saturations on Room Air at Rest = 95%  Patient Saturations on Room Air while Ambulating = 94%  Patient Saturations on 0 Liters of oxygen while Ambulating = NT as pt was at 94% on RA while walking.  Please briefly explain why patient needs home oxygen: Pt does not need home O2.  Verdene Lennert, PT, DPT  Acute Rehabilitation [phone] pager [phone] office

## 2020-01-25 NOTE — Evaluation (Signed)
Physical Therapy Evaluation Patient Details Name: CHALESE Burnett MRN: [id number] DOB: [birth year] Today's Date: 01/25/2020   History of Present Illness  86 y.o. female admitted on 01/23/20 for cough and maliaise.  Pt intially with rapid (+) COVID test, but follow up PCR test was negative.  She is fully vaccinated.  Pt dx with R upper lobe PNA, acute on chronic anemia.  Pt with significant PMH of falls with fx (pelvic fx, lumbar compresion fx (L1/2), stroke, Barrett's esophagus, R hip ORIF, lumbar kyphoplasty.   Clinical Impression  Pt was able to ambulate in the hallway on RA with sats in the mid 90s throughout gait and no signs of distress, just generalized weakness reported during gait more than her baseline.  She was too fatigued to participate in seated exercises after gait, but did brush her teeth with set up supine in bed with HOB elevated.  She is mildly confused (thinking we are currently at her ALF) but this may be baseline.   PT to follow acutely for deficits listed below.      Follow Up Recommendations Home health PT;Other (comment) (at ALF)    Equipment Recommendations  None recommended by PT    Recommendations for Other Services       Precautions / Restrictions Precautions Precautions: Fall Precaution Comments: h/o falls with fx      Mobility  Bed Mobility Overal bed mobility: Needs Assistance Bed Mobility: Supine to Sit     Supine to sit: Min guard     General bed mobility comments: Min guard assist for safety, to prevent posterior LOB with light touch.   Transfers Overall transfer level: Needs assistance Equipment used: Rolling walker (2 wheeled) Transfers: Sit to/from Stand Sit to Stand: Min guard         General transfer comment: Min guard assist for safety.   Ambulation/Gait Ambulation/Gait assistance: Min guard Gait Distance (Feet): 100 Feet Assistive device: Rolling walker (2 wheeled) Gait Pattern/deviations: Step-through pattern;Shuffle;Trunk  flexed     General Gait Details: Cues for upright posture and to avoid obstacles with L front wheel of RW.  Cues for closer proximity to RW.   Stairs            Wheelchair Mobility    Modified Rankin (Stroke Patients Only)       Balance Overall balance assessment: Needs assistance Sitting-balance support: Feet supported;No upper extremity supported Sitting balance-Leahy Scale: Fair Sitting balance - Comments: close supervision EOB, not challenged Postural control: Posterior lean Standing balance support: Bilateral upper extremity supported Standing balance-Leahy Scale: Poor Standing balance comment: needs light external support and is reliant on RW for balance in standing.                              Pertinent Vitals/Pain Pain Assessment: No/denies pain    Home Living Family/patient expects to be discharged to:: Assisted living                      Prior Function Level of Independence: Independent with assistive device(s)         Comments: reports using a walker, having help bathing 3 days per week and being active with therapy at ALF     Hand Dominance   Dominant Hand: Right    Extremity/Trunk Assessment   Upper Extremity Assessment Upper Extremity Assessment: Defer to OT evaluation    Lower Extremity Assessment Lower Extremity Assessment: Generalized weakness  Cervical / Trunk Assessment Cervical / Trunk Assessment: Kyphotic;Other exceptions Cervical / Trunk Exceptions: h/o lumbar compression fxs and kyphoplasty  Communication   Communication: No difficulties  Cognition Arousal/Alertness: Awake/alert Behavior During Therapy: WFL for tasks assessed/performed Overall Cognitive Status: No family/caregiver present to determine baseline cognitive functioning                                 General Comments: Pt confused, unsure of baseline.       General Comments General comments (skin integrity, edema, etc.):  O2 sats 94% on RA during mobility and no audible DOE.  PT planned to do sitting exercises after gait, however, pt was too fatigued and wanted to lay back down.     Exercises     Assessment/Plan    PT Assessment Patient needs continued PT services  PT Problem List Decreased strength;Decreased balance;Decreased activity tolerance;Decreased mobility;Decreased knowledge of use of DME;Decreased knowledge of precautions       PT Treatment Interventions DME instruction;Gait training;Functional mobility training;Therapeutic activities;Therapeutic exercise;Neuromuscular re-education;Balance training;Cognitive remediation;Patient/family education    PT Goals (Current goals can be found in the Care Plan section)  Acute Rehab PT Goals Patient Stated Goal: to return home, feel better PT Goal Formulation: With patient Time For Goal Achievement: 02/08/20 Potential to Achieve Goals: Good    Frequency Min 3X/week   Barriers to discharge        Co-evaluation               AM-PAC PT "6 Clicks" Mobility  Outcome Measure Help needed turning from your back to your side while in a flat bed without using bedrails?: A Little Help needed moving from lying on your back to sitting on the side of a flat bed without using bedrails?: A Little Help needed moving to and from a bed to a chair (including a wheelchair)?: A Little Help needed standing up from a chair using your arms (e.g., wheelchair or bedside chair)?: A Little Help needed to walk in hospital room?: A Little Help needed climbing 3-5 steps with a railing? : A Little 6 Click Score: 18    End of Session Equipment Utilized During Treatment: Gait belt Activity Tolerance: Patient limited by fatigue Patient left: in bed;with call bell/phone within reach;with bed alarm set Nurse Communication: Mobility status;Other (comment) (to RN tech) PT Visit Diagnosis: Muscle weakness (generalized) (M62.81);History of falling (Z91.81);Difficulty in  walking, not elsewhere classified (R26.2)    Time: 0814-4818 PT Time Calculation (min) (ACUTE ONLY): 22 min   Charges:   PT Evaluation $PT Eval Moderate Complexity: [place], PT, DPT  Acute Rehabilitation [phone] pager [phone]) [phone] office

## 2020-01-25 NOTE — Progress Notes (Addendum)
PROGRESS NOTE        PATIENT DETAILS Name: Katrina Burnett Age: 86 y.o. Sex: female Date of Birth: [birth year] Admit Date: 01/23/2020 Admitting Physician Vernelle Emerald, MD PCP:Kim, Jeneen Rinks, MD  Brief Narrative: Patient is a 86 y.o. female chronic lower back pain, CKD stage IIIa, diverticulosis, depression/anxiety-resident of a local independent living facility-presenting to the ED on 9/15 after not feeling well, some cough and subjective fever.  Her Covid antigen test in the independent living facility was positive-however Covid PCR x2 on admission was negative.  Significant events: 9/15>> admit to [facility] complaining of not feeling well, cough, sore throat-tested positive for COVID-19 at independent living facility.  COVID-19 PCR x2 - on admission  COVID-19 vaccination status: Fully vaccinated  Significant studies: 9/15>> chest x-ray: Questionable early pneumonia in the right upper lobe.   Antimicrobial therapy: Rocephin: 9/15>> Zithromax: 9/15>>  Microbiology data: 9/15>> blood culture: 1/2 staph epidermidis, staph lugdunensis. 9/15>> urine culture: Multiple species  Procedures : None  Consults: None  DVT Prophylaxis : enoxaparin (LOVENOX) injection 40 mg Start: 01/24/20 1800 SCDs Start: 01/23/20 1226 Prophylactic Lovenox   Subjective: Feels better-asking to go home.  Assessment/Plan: Acute hypoxic respiratory failure secondary to community-acquired pneumonia: Initial concern for COVID-19 related pneumonia-however COVID-19 PCR x2 neg.  Suspect she had a false positive antigen test at her facility for COVID-19.  Had very mild hypoxemia on initial presentation-but has been transitioned to room air-she was ambulated by therapy services today-O2 saturations were in the 90s well.  Respiratory virus panel negative.  Blood cultures negative (1/2-coag negative staph-likely contaminant)-continue Rocephin/Zithromax.  If she continues to improve-suspect  she could be discharged home with home health services tomorrow.    Addendum: Case-blood culture results discussed with infectious disease MD-Dr. Glynis Smiles suggested we repeat blood cultures today-if repeat cultures are negative-no need for further treatment-as likely contaminant, however if repeat cultures are positive-patient might need treatment with IV vancomycin.  Have reordered blood cultures.  Normocytic anemia: Likely iron deficiency anemia-denies any recent history of melena/hematochezia.  Stool FOBT negative.  No indication for transfusion-had severe esophagitis in EGD done on July 2021.  Since not actively bleeding-suspect does not require any further endoscopic evaluation.  Start iron supplementation and follow CBC.  CKD stage IIIa: Creatinine close to baseline-follow  Recent history of esophagitis: Remains on PPI  ??Recent pelvic fracture: For the past few weeks-patient is at increased pain in her pelvic area-she has been seen by Dr. Donley Redder in the outpatient setting.  I spoke with Dr. Mayer Camel on 9/16-her x-rays were negative but he suspected a possible small pelvic fracture-he was contemplating doing a nuclear bone scan-but given that it is unlikely can change management in a 86 year old patient-we both agree not to pursue while inpatient.    History of chronic low back pain/right hip pain/pelvic pain: Chronic issue-s/p right hip replacement/kyphoplasty-continue usual narcotic regimen-Flexeril and Neurontin and other supportive measures.    Constipation: Continue MiraLAX  Depression/anxiety: Continue trazodone, Celexa and as needed Ativan.   Diet: Diet Order            Diet regular Room service appropriate? Yes; Fluid consistency: Thin  Diet effective now                  Code Status: DNR  Family Communication: [id number] voicemail 9/17  Disposition Plan: Status is:  Inpatient  Remains inpatient appropriate because:Inpatient level of  care appropriate due to severity of illness  Dispo: The patient is from: Home              Anticipated d/c is to: Home              Anticipated d/c date is: 2 days              Patient currently is not medically stable to d/c.  Barriers to Discharge: Pneumonia with hypoxia requiring IV antibiotics  Antimicrobial agents: Anti-infectives (From admission, onward)   Start     Dose/Rate Route Frequency Ordered Stop   01/25/20 1800  azithromycin (ZITHROMAX) 500 mg in sodium chloride 0.9 % 250 mL IVPB        500 mg 250 mL/hr over 60 Minutes Intravenous Every 24 hours 01/24/20 2026 01/28/20 1759   01/25/20 1500  cefTRIAXone (ROCEPHIN) 1 g in sodium chloride 0.9 % 100 mL IVPB        1 g 200 mL/hr over 30 Minutes Intravenous Every 24 hours 01/24/20 2026 01/28/20 1459   01/24/20 1300  cefTRIAXone (ROCEPHIN) 1 g in sodium chloride 0.9 % 100 mL IVPB  Status:  Discontinued        1 g 200 mL/hr over 30 Minutes Intravenous Every 24 hours 01/23/20 1308 01/24/20 2023   01/24/20 1300  azithromycin (ZITHROMAX) 500 mg in sodium chloride 0.9 % 250 mL IVPB  Status:  Discontinued        500 mg 250 mL/hr over 60 Minutes Intravenous Every 24 hours 01/23/20 1308 01/24/20 2023   01/23/20 1215  cefTRIAXone (ROCEPHIN) 1 g in sodium chloride 0.9 % 100 mL IVPB        1 g 200 mL/hr over 30 Minutes Intravenous  Once 01/23/20 1205 01/23/20 1343   01/23/20 1215  azithromycin (ZITHROMAX) 500 mg in sodium chloride 0.9 % 250 mL IVPB        500 mg 250 mL/hr over 60 Minutes Intravenous  Once 01/23/20 1205 01/23/20 1525       Time spent: 25- minutes-Greater than 50% of this time was spent in counseling, explanation of diagnosis, planning of further management, and coordination of care.  MEDICATIONS: Scheduled Meds: . citalopram  40 mg Oral Daily  . enoxaparin (LOVENOX) injection  40 mg Subcutaneous Q24H  . gabapentin  600 mg Oral QHS  . influenza vaccine adjuvanted  0.5 mL Intramuscular Tomorrow-1000  .  lactobacillus acidophilus  1 tablet Oral QHS  . pantoprazole  40 mg Oral Daily  . polyethylene glycol  17 g Oral Daily  . traZODone  50 mg Oral QHS   Continuous Infusions: . azithromycin    . cefTRIAXone (ROCEPHIN)  IV     PRN Meds:.acetaminophen **OR** acetaminophen, cyclobenzaprine, LORazepam, ondansetron **OR** ondansetron (ZOFRAN) IV, oxyCODONE-acetaminophen **AND** oxyCODONE   PHYSICAL EXAM: Vital signs: Vitals:   01/24/20 2205 01/24/20 2234 01/25/20 0829 01/25/20 1017  BP: (!) 161/87 (!) 174/82 134/63   Pulse: 88  (!) 102   Resp: 16 18 16    Temp: 99.7 F (37.6 C) 98.5 F (36.9 C) 98.4 F (36.9 C)   TempSrc:  Oral    SpO2: 94% 96% 93% 94%  Height:  5\' 2"  (1.575 m)     There were no vitals filed for this visit. Body mass index is 13.88 kg/m.   Gen Exam:Alert awake-not in any distress HEENT:atraumatic, normocephalic Chest: B/L clear to auscultation anteriorly CVS:S1S2 regular Abdomen:soft non tender, non distended Extremities:no  edema Neurology: Non focal Skin: no rash  I have personally reviewed following labs and imaging studies  LABORATORY DATA: CBC: Recent Labs  Lab 01/23/20 1006 01/24/20 0232 01/25/20 0108  WBC 9.1 9.1 13.7*  NEUTROABS 6.9  --   --   HGB 8.6* 8.6* 8.0*  HCT 29.2* 29.7* 26.9*  MCV 86.1 85.1 80.1  PLT 549* 552* 587*    Basic Metabolic Panel: Recent Labs  Lab 01/23/20 1006 01/23/20 1500 01/24/20 0232 01/25/20 0108  NA 138  --  137 137  K 4.1  --  3.6 3.3*  CL 106  --  105 103  CO2 20*  --  22 22  GLUCOSE 90  --  85 101*  BUN 14  --  10 11  CREATININE 0.90  --  0.78 0.77  CALCIUM 8.9  --  8.9 9.1  MG  --  1.9  --   --   PHOS  --  3.1  --   --     GFR: CrCl cannot be calculated (Unknown ideal weight.).  Liver Function Tests: Recent Labs  Lab 01/23/20 1006 01/24/20 0232  AST 19 18  ALT 14 11  ALKPHOS 88 76  BILITOT 0.4 0.6  PROT 5.4* 5.1*  ALBUMIN 2.7* 2.4*   No results for input(s): LIPASE, AMYLASE in the  last 168 hours. No results for input(s): AMMONIA in the last 168 hours.  Coagulation Profile: No results for input(s): INR, PROTIME in the last 168 hours.  Cardiac Enzymes: No results for input(s): CKTOTAL, CKMB, CKMBINDEX, TROPONINI in the last 168 hours.  BNP (last 3 results) No results for input(s): PROBNP in the last 8760 hours.  Lipid Profile: Recent Labs    01/23/20 1006  TRIG 63    Thyroid Function Tests: Recent Labs    01/23/20 1500  TSH 1.293    Anemia Panel: Recent Labs    01/23/20 1006  VITAMINB12 1,402*  FOLATE 54.3  FERRITIN 20  TIBC 407  IRON 9*  RETICCTPCT 1.1    Urine analysis:    Component Value Date/Time   COLORURINE YELLOW 01/23/2020 1522   APPEARANCEUR CLEAR 01/23/2020 1522   LABSPEC 1.012 01/23/2020 1522   PHURINE 5.0 01/23/2020 1522   GLUCOSEU NEGATIVE 01/23/2020 1522   HGBUR NEGATIVE 01/23/2020 1522   BILIRUBINUR NEGATIVE 01/23/2020 1522   KETONESUR NEGATIVE 01/23/2020 1522   PROTEINUR NEGATIVE 01/23/2020 1522   UROBILINOGEN 0.2 05/24/2013 1634   NITRITE POSITIVE (A) 01/23/2020 1522   LEUKOCYTESUR SMALL (A) 01/23/2020 1522    Sepsis Labs: Lactic Acid, Venous    Component Value Date/Time   LATICACIDVEN 0.7 01/23/2020 1447    MICROBIOLOGY: Recent Results (from the past 240 hour(s))  SARS Coronavirus 2 by RT PCR (hospital order, performed in [place] hospital lab) Nasopharyngeal Nasopharyngeal Swab     Status: None   Collection Time: 01/23/20 10:06 AM   Specimen: Nasopharyngeal Swab  Result Value Ref Range Status   SARS Coronavirus 2 NEGATIVE NEGATIVE Final    Comment: (NOTE) SARS-CoV-2 target nucleic acids are NOT DETECTED.  The SARS-CoV-2 RNA is generally detectable in upper and lower respiratory specimens during the acute phase of infection. The lowest concentration of SARS-CoV-2 viral copies this assay can detect is 250 copies / mL. A negative result does not preclude SARS-CoV-2 infection and should not be used as  the sole basis for treatment or other patient management decisions.  A negative result may occur with improper specimen collection / handling, submission of specimen other  than nasopharyngeal swab, presence of viral mutation(s) within the areas targeted by this assay, and inadequate number of viral copies (<250 copies / mL). A negative result must be combined with clinical observations, patient history, and epidemiological information.  Fact Sheet for Patients:   StrictlyIdeas.no  Fact Sheet for Healthcare Providers: BankingDealers.co.za  This test is not yet approved or  cleared by the [place] FDA and has been authorized for detection and/or diagnosis of SARS-CoV-2 by FDA under an Emergency Use Authorization (EUA).  This EUA will remain in effect (meaning this test can be used) for the duration of the COVID-19 declaration under Section 564(b)(1) of the Act, 21 U.S.C. section 360bbb-3(b)(1), unless the authorization is terminated or revoked sooner.  Performed at [facility], [place] [address], [place], [place] [address]   Blood Culture (routine x 2)     Status: Abnormal   Collection Time: 01/23/20 10:17 AM   Specimen: BLOOD RIGHT ARM  Result Value Ref Range Status   Specimen Description BLOOD RIGHT ARM  Final   Special Requests   Final    BOTTLES DRAWN AEROBIC AND ANAEROBIC Blood Culture results may not be optimal due to an inadequate volume of blood received in culture bottles   Culture  Setup Time   Final    GRAM POSITIVE COCCI IN BOTH AEROBIC AND ANAEROBIC BOTTLES Organism ID to follow CRITICAL RESULT CALLED TO, READ BACK BY AND VERIFIED WITH: K AMEND PHARMD 01/24/20 0221 JDW    Culture (A)  Final    STAPHYLOCOCCUS EPIDERMIDIS STAPHYLOCOCCUS LUGDUNENSIS THE SIGNIFICANCE OF ISOLATING THIS ORGANISM FROM A SINGLE SET OF BLOOD CULTURES WHEN MULTIPLE SETS ARE DRAWN IS UNCERTAIN. PLEASE NOTIFY THE MICROBIOLOGY DEPARTMENT  WITHIN ONE WEEK IF SPECIATION AND SENSITIVITIES ARE REQUIRED. Performed at [facility], [place] [address] [place], [place] [address]    Report Status 01/25/2020 FINAL  Final  Blood Culture ID Panel (Reflexed)     Status: Abnormal   Collection Time: 01/23/20 10:17 AM  Result Value Ref Range Status   Enterococcus faecalis NOT DETECTED NOT DETECTED Final   Enterococcus Faecium NOT DETECTED NOT DETECTED Final   Listeria monocytogenes NOT DETECTED NOT DETECTED Final   Staphylococcus species DETECTED (A) NOT DETECTED Final    Comment: CRITICAL RESULT CALLED TO, READ BACK BY AND VERIFIED WITH:  K AMEND PHARMD 01/24/20 0221 JDW    Staphylococcus aureus (BCID) NOT DETECTED NOT DETECTED Final   Staphylococcus epidermidis DETECTED (A) NOT DETECTED Final    Comment: Methicillin (oxacillin) resistant coagulase negative staphylococcus. Possible blood culture contaminant (unless isolated from more than one blood culture draw or clinical case suggests pathogenicity). No antibiotic treatment is indicated for blood  culture contaminants. CRITICAL RESULT CALLED TO, READ BACK BY AND VERIFIED WITH: K AMEND PHARMD 01/24/20 0221 JDW    Staphylococcus lugdunensis DETECTED (A) NOT DETECTED Final    Comment: Methicillin (oxacillin) resistant coagulase negative staphylococcus. Possible blood culture contaminant (unless isolated from more than one blood culture draw or clinical case suggests pathogenicity). No antibiotic treatment is indicated for blood  culture contaminants. CRITICAL RESULT CALLED TO, READ BACK BY AND VERIFIED WITH: K AMEND PHARMD 01/24/20 0221 JDW    Streptococcus species NOT DETECTED NOT DETECTED Final   Streptococcus agalactiae NOT DETECTED NOT DETECTED Final   Streptococcus pneumoniae NOT DETECTED NOT DETECTED Final   Streptococcus pyogenes NOT DETECTED NOT DETECTED Final   A.calcoaceticus-baumannii NOT DETECTED NOT DETECTED Final   Bacteroides fragilis NOT DETECTED NOT DETECTED Final  Enterobacterales NOT DETECTED NOT DETECTED Final   Enterobacter cloacae complex NOT DETECTED NOT DETECTED Final   Escherichia coli NOT DETECTED NOT DETECTED Final   Klebsiella aerogenes NOT DETECTED NOT DETECTED Final   Klebsiella oxytoca NOT DETECTED NOT DETECTED Final   Klebsiella pneumoniae NOT DETECTED NOT DETECTED Final   Proteus species NOT DETECTED NOT DETECTED Final   Salmonella species NOT DETECTED NOT DETECTED Final   Serratia marcescens NOT DETECTED NOT DETECTED Final   Haemophilus influenzae NOT DETECTED NOT DETECTED Final   Neisseria meningitidis NOT DETECTED NOT DETECTED Final   Pseudomonas aeruginosa NOT DETECTED NOT DETECTED Final   Stenotrophomonas maltophilia NOT DETECTED NOT DETECTED Final   Candida albicans NOT DETECTED NOT DETECTED Final   Candida auris NOT DETECTED NOT DETECTED Final   Candida glabrata NOT DETECTED NOT DETECTED Final   Candida krusei NOT DETECTED NOT DETECTED Final   Candida parapsilosis NOT DETECTED NOT DETECTED Final   Candida tropicalis NOT DETECTED NOT DETECTED Final   Cryptococcus neoformans/gattii NOT DETECTED NOT DETECTED Final   Methicillin resistance mecA/C DETECTED (A) NOT DETECTED Final    Comment: CRITICAL RESULT CALLED TO, READ BACK BY AND VERIFIED WITH: K AMEND [facility] 01/24/20 0221 JDW Performed at [facility], [address], [place], [place] [address]   Blood Culture (routine x 2)     Status: None (Preliminary result)   Collection Time: 01/23/20  2:47 PM   Specimen: BLOOD  Result Value Ref Range Status   Specimen Description BLOOD RIGHT ANTECUBITAL  Final   Special Requests   Final    BOTTLES DRAWN AEROBIC AND ANAEROBIC Blood Culture adequate volume   Culture   Final    NO GROWTH 2 DAYS Performed at [facility], [place] [address] [place], [place] [address]    Report Status PENDING  Incomplete  Culture, Urine     Status: Abnormal   Collection Time: 01/23/20  4:44 PM   Specimen: Urine, Random  Result Value Ref  Range Status   Specimen Description URINE, RANDOM  Final   Special Requests   Final    NONE Performed at [facility], [place] [address] [place], [place] [address]    Culture MULTIPLE SPECIES PRESENT, SUGGEST RECOLLECTION (A)  Final   Report Status 01/24/2020 FINAL  Final  SARS Coronavirus 2 by RT PCR (hospital order, performed in [facility] hospital lab) Nasopharyngeal Nasopharyngeal Swab     Status: None   Collection Time: 01/24/20  2:31 AM   Specimen: Nasopharyngeal Swab  Result Value Ref Range Status   SARS Coronavirus 2 NEGATIVE NEGATIVE Final    Comment: (NOTE) SARS-CoV-2 target nucleic acids are NOT DETECTED.  The SARS-CoV-2 RNA is generally detectable in upper and lower respiratory specimens during the acute phase of infection. The lowest concentration of SARS-CoV-2 viral copies this assay can detect is 250 copies / mL. A negative result does not preclude SARS-CoV-2 infection and should not be used as the sole basis for treatment or other patient management decisions.  A negative result may occur with improper specimen collection / handling, submission of specimen other than nasopharyngeal swab, presence of viral mutation(s) within the areas targeted by this assay, and inadequate number of viral copies (<250 copies / mL). A negative result must be combined with clinical observations, patient history, and epidemiological information.  Fact Sheet for Patients:   StrictlyIdeas.no  Fact Sheet for Healthcare Providers: BankingDealers.co.za  This test is not yet approved or  cleared by the  [place] Architectural technologist and has been authorized for detection and/or diagnosis of SARS-CoV-2 by FDA under an Print production planner (EUA).  This EUA will remain in effect (meaning this test can be used) for the duration of the COVID-19 declaration under Section 564(b)(1) of the Act, 21 U.S.C. section 360bbb-3(b)(1), unless the authorization  is terminated or revoked sooner.  Performed at [facility], [place] [address], [place], [place] [address]   Respiratory Panel by PCR     Status: None   Collection Time: 01/24/20  6:13 PM   Specimen: Nasopharyngeal Swab; Respiratory  Result Value Ref Range Status   Adenovirus NOT DETECTED NOT DETECTED Final   Coronavirus 229E NOT DETECTED NOT DETECTED Final    Comment: (NOTE) The Coronavirus on the Respiratory Panel, DOES NOT test for the novel  Coronavirus (2019 nCoV)    Coronavirus HKU1 NOT DETECTED NOT DETECTED Final   Coronavirus NL63 NOT DETECTED NOT DETECTED Final   Coronavirus OC43 NOT DETECTED NOT DETECTED Final   Metapneumovirus NOT DETECTED NOT DETECTED Final   Rhinovirus / Enterovirus NOT DETECTED NOT DETECTED Final   Influenza A NOT DETECTED NOT DETECTED Final   Influenza B NOT DETECTED NOT DETECTED Final   Parainfluenza Virus 1 NOT DETECTED NOT DETECTED Final   Parainfluenza Virus 2 NOT DETECTED NOT DETECTED Final   Parainfluenza Virus 3 NOT DETECTED NOT DETECTED Final   Parainfluenza Virus 4 NOT DETECTED NOT DETECTED Final   Respiratory Syncytial Virus NOT DETECTED NOT DETECTED Final   Bordetella pertussis NOT DETECTED NOT DETECTED Final   Chlamydophila pneumoniae NOT DETECTED NOT DETECTED Final   Mycoplasma pneumoniae NOT DETECTED NOT DETECTED Final    Comment: Performed at [facility], [place]. [address], [place], [place] [address]    RADIOLOGY STUDIES/RESULTS: DG Chest Port 1 View  Result Date: 01/25/2020 CLINICAL DATA:  Shortness of breath EXAM: PORTABLE CHEST 1 VIEW COMPARISON:  01/23/2020 FINDINGS: Cardiomegaly. Patchy peripheral opacities seen in the right upper lobe and right lower lobe, similar to prior study. No definite confluent opacity on the left. No effusions or pneumothorax. No acute bony abnormality. IMPRESSION: Patchy airspace opacities in the right lung, question pneumonia. Cardiomegaly. Electronically Signed   By: Rolm Baptise M.D.   On:  01/25/2020 07:28     LOS: 2 days   Oren Binet, MD  [facility]    To contact the attending provider between 7A-7P or the covering provider during after hours 7P-7A, please log into the web site [email or web address] and access using universal [place] password for that web site. If you do not have the password, please call the hospital operator.  01/25/2020, 1:48 PM

## 2020-01-26 NOTE — Progress Notes (Signed)
PROGRESS NOTE        PATIENT DETAILS Name: Katrina Burnett Age: 86 y.o. Sex: female Date of Birth: 10-24-1924 Admit Date: 01/23/2020 Admitting Physician Vernelle Emerald, MD PCP:Kim, Jeneen Rinks, MD  Brief Narrative: Patient is a 86 y.o. female chronic lower back pain, CKD stage IIIa, diverticulosis, depression/anxiety-resident of a local independent living facility-presenting to the ED on 9/15 after not feeling well, some cough and subjective fever.  Her Covid antigen test in the independent living facility was positive-however Covid PCR x2 on admission was negative.  Significant events: 9/15>> admit to [facility] complaining of not feeling well, cough, sore throat-tested positive for COVID-19 at independent living facility.  COVID-19 PCR x2 - on admission  COVID-19 vaccination status: Fully vaccinated  Significant studies: 9/15>> chest x-ray: Questionable early pneumonia in the right upper lobe.   Antimicrobial therapy: Rocephin: 9/15>> Zithromax: 9/15>>9/18  Microbiology data: 9/17>> blood culture: Pending 9/15>> blood culture: 1/2 staph epidermidis, staph lugdunensis. 9/15>> urine culture: Multiple species  Procedures : None  Consults: None  DVT Prophylaxis : enoxaparin (LOVENOX) injection 40 mg Start: 01/24/20 1800 SCDs Start: 01/23/20 1226 Prophylactic Lovenox   Subjective: No major events overnight-continues to have some mild pain at the pelvic area.  Coughing has resolved.  Assessment/Plan: Acute hypoxic respiratory failure secondary to community-acquired pneumonia: Initial concern for COVID-19 related pneumonia-however COVID-19 PCR x2 neg.  Suspect she had a false positive antigen test at her facility for COVID-19.  Had very mild hypoxemia on initial presentation-but has been transitioned to room air-she was ambulated by therapy services today-O2 saturations were in the 90s well.  Respiratory virus panel negative.  Stop Zithromax today-continue  Rocephin-times total of 5 days.  Coag negative staph bacteremia: 1 out of 2 set of blood cultures drawn on admission-positive for staph epidermidis and staph lugdunensis-patient has hardware in her right hip.  Has been having pain in the right hip/pelvic area for the past few weeks-necessitating orthopedic this visit as an outpatient.  Per orthopedic MD-no obvious fractures on imaging studies that were done as an outpatient.  Case was discussed with Dr. Carollee Herter disease MD on-call on 9/17-recommendations were to repeat blood cultures-if they were negative-these were likely contaminant without any clinical significance.  However if repeat cultures were still positive for coag negative staph-may require treatment.  Normocytic anemia: Likely iron deficiency anemia-denies any recent history of melena/hematochezia.  Stool FOBT negative.  No indication for transfusion-had severe esophagitis in EGD done on July 2021.  Since not actively bleeding-suspect does not require any further endoscopic evaluation.  Start iron supplementation and follow CBC.  CKD stage IIIa: Creatinine close to baseline-follow  Recent history of esophagitis: Remains on PPI  ??Recent pelvic fracture: For the past few weeks-patient is at increased pain in her pelvic area-she has been seen by Dr. Donley Redder in the outpatient setting.  I spoke with Dr. Mayer Camel on 9/16-her x-rays were negative but he suspected a possible small pelvic fracture-he was contemplating doing a nuclear bone scan-but given that it is unlikely can change management in a 86 year old patient-we both agree not to pursue while inpatient.    History of chronic low back pain/right hip pain/pelvic pain: Chronic issue-s/p right hip replacement/kyphoplasty-continue usual narcotic regimen-Flexeril and Neurontin and other supportive measures.    Constipation: Continue MiraLAX  Depression/anxiety: Continue trazodone, Celexa and as needed  Ativan.  Diet: Diet Order            Diet regular Room service appropriate? Yes; Fluid consistency: Thin  Diet effective now                  Code Status: DNR  Family Communication: [id number] 9/18  Disposition Plan: Status is: Inpatient  Remains inpatient appropriate because:Inpatient level of care appropriate due to severity of illness  Dispo: The patient is from: Home              Anticipated d/c is to: Home              Anticipated d/c date is: 2 days              Patient currently is not medically stable to d/c.  Barriers to Discharge: Pneumonia with hypoxia requiring IV antibiotics  Antimicrobial agents: Anti-infectives (From admission, onward)   Start     Dose/Rate Route Frequency Ordered Stop   01/25/20 1800  azithromycin (ZITHROMAX) 500 mg in sodium chloride 0.9 % 250 mL IVPB        500 mg 250 mL/hr over 60 Minutes Intravenous Every 24 hours 01/24/20 2026 01/28/20 1759   01/25/20 1500  cefTRIAXone (ROCEPHIN) 1 g in sodium chloride 0.9 % 100 mL IVPB        1 g 200 mL/hr over 30 Minutes Intravenous Every 24 hours 01/24/20 2026 01/28/20 1459   01/24/20 1300  cefTRIAXone (ROCEPHIN) 1 g in sodium chloride 0.9 % 100 mL IVPB  Status:  Discontinued        1 g 200 mL/hr over 30 Minutes Intravenous Every 24 hours 01/23/20 1308 01/24/20 2023   01/24/20 1300  azithromycin (ZITHROMAX) 500 mg in sodium chloride 0.9 % 250 mL IVPB  Status:  Discontinued        500 mg 250 mL/hr over 60 Minutes Intravenous Every 24 hours 01/23/20 1308 01/24/20 2023   01/23/20 1215  cefTRIAXone (ROCEPHIN) 1 g in sodium chloride 0.9 % 100 mL IVPB        1 g 200 mL/hr over 30 Minutes Intravenous  Once 01/23/20 1205 01/23/20 1343   01/23/20 1215  azithromycin (ZITHROMAX) 500 mg in sodium chloride 0.9 % 250 mL IVPB        500 mg 250 mL/hr over 60 Minutes Intravenous  Once 01/23/20 1205 01/23/20 1525       Time spent: 25- minutes-Greater than 50% of this time was spent in  counseling, explanation of diagnosis, planning of further management, and coordination of care.  MEDICATIONS: Scheduled Meds: . citalopram  40 mg Oral Daily  . enoxaparin (LOVENOX) injection  40 mg Subcutaneous Q24H  . ferrous sulfate  325 mg Oral BID WC  . gabapentin  600 mg Oral QHS  . influenza vaccine adjuvanted  0.5 mL Intramuscular Tomorrow-1000  . lactobacillus acidophilus  1 tablet Oral QHS  . pantoprazole  40 mg Oral Daily  . polyethylene glycol  17 g Oral Daily  . traZODone  50 mg Oral QHS   Continuous Infusions: . azithromycin 500 mg (01/25/20 1653)  . cefTRIAXone (ROCEPHIN)  IV 1 g (01/25/20 1438)   PRN Meds:.acetaminophen **OR** acetaminophen, cyclobenzaprine, LORazepam, ondansetron **OR** ondansetron (ZOFRAN) IV, oxyCODONE-acetaminophen **AND** oxyCODONE   PHYSICAL EXAM: Vital signs: Vitals:   01/25/20 2147 01/26/20 0221 01/26/20 0406 01/26/20 0723  BP: 139/80   (!) 160/84  Pulse: 78 78  82  Resp: 18   16  Temp: 98.6 F (37 C)  98.6 F (37 C)  99 F (37.2 C)  TempSrc: Oral Oral    SpO2: 92%   (!) 89%  Weight:   46.4 kg   Height:       Filed Weights   01/26/20 0406  Weight: 46.4 kg   Body mass index is 18.71 kg/m.   Gen Exam:Alert awake-not in any distress HEENT:atraumatic, normocephalic Chest: B/L clear to auscultation anteriorly CVS:S1S2 regular Abdomen:soft non tender, non distended Extremities:no edema Neurology: Non focal Skin: no rash  I have personally reviewed following labs and imaging studies  LABORATORY DATA: CBC: Recent Labs  Lab 01/23/20 1006 01/24/20 0232 01/25/20 0108  WBC 9.1 9.1 13.7*  NEUTROABS 6.9  --   --   HGB 8.6* 8.6* 8.0*  HCT 29.2* 29.7* 26.9*  MCV 86.1 85.1 80.1  PLT 549* 552* 587*    Basic Metabolic Panel: Recent Labs  Lab 01/23/20 1006 01/23/20 1500 01/24/20 0232 01/25/20 0108  NA 138  --  137 137  K 4.1  --  3.6 3.3*  CL 106  --  105 103  CO2 20*  --  22 22  GLUCOSE 90  --  85 101*  BUN 14  --   10 11  CREATININE 0.90  --  0.78 0.77  CALCIUM 8.9  --  8.9 9.1  MG  --  1.9  --   --   PHOS  --  3.1  --   --     GFR: Estimated Creatinine Clearance: 32.2 mL/min (by C-G formula based on SCr of 0.77 mg/dL).  Liver Function Tests: Recent Labs  Lab 01/23/20 1006 01/24/20 0232  AST 19 18  ALT 14 11  ALKPHOS 88 76  BILITOT 0.4 0.6  PROT 5.4* 5.1*  ALBUMIN 2.7* 2.4*   No results for input(s): LIPASE, AMYLASE in the last 168 hours. No results for input(s): AMMONIA in the last 168 hours.  Coagulation Profile: No results for input(s): INR, PROTIME in the last 168 hours.  Cardiac Enzymes: No results for input(s): CKTOTAL, CKMB, CKMBINDEX, TROPONINI in the last 168 hours.  BNP (last 3 results) No results for input(s): PROBNP in the last 8760 hours.  Lipid Profile: No results for input(s): CHOL, HDL, LDLCALC, TRIG, CHOLHDL, LDLDIRECT in the last 72 hours.  Thyroid Function Tests: Recent Labs    01/23/20 1500  TSH 1.293    Anemia Panel: No results for input(s): VITAMINB12, FOLATE, FERRITIN, TIBC, IRON, RETICCTPCT in the last 72 hours.  Urine analysis:    Component Value Date/Time   COLORURINE YELLOW 01/23/2020 1522   APPEARANCEUR CLEAR 01/23/2020 1522   LABSPEC 1.012 01/23/2020 1522   PHURINE 5.0 01/23/2020 1522   GLUCOSEU NEGATIVE 01/23/2020 1522   HGBUR NEGATIVE 01/23/2020 1522   BILIRUBINUR NEGATIVE 01/23/2020 1522   KETONESUR NEGATIVE 01/23/2020 1522   PROTEINUR NEGATIVE 01/23/2020 1522   UROBILINOGEN 0.2 05/24/2013 1634   NITRITE POSITIVE (A) 01/23/2020 1522   LEUKOCYTESUR SMALL (A) 01/23/2020 1522    Sepsis Labs: Lactic Acid, Venous    Component Value Date/Time   LATICACIDVEN 0.7 01/23/2020 1447    MICROBIOLOGY: Recent Results (from the past 240 hour(s))  SARS Coronavirus 2 by RT PCR (hospital order, performed in [place] hospital lab) Nasopharyngeal Nasopharyngeal Swab     Status: None   Collection Time: 01/23/20 10:06 AM   Specimen:  Nasopharyngeal Swab  Result Value Ref Range Status   SARS Coronavirus 2 NEGATIVE NEGATIVE Final    Comment: (NOTE) SARS-CoV-2 target nucleic acids are  NOT DETECTED.  The SARS-CoV-2 RNA is generally detectable in upper and lower respiratory specimens during the acute phase of infection. The lowest concentration of SARS-CoV-2 viral copies this assay can detect is 250 copies / mL. A negative result does not preclude SARS-CoV-2 infection and should not be used as the sole basis for treatment or other patient management decisions.  A negative result may occur with improper specimen collection / handling, submission of specimen other than nasopharyngeal swab, presence of viral mutation(s) within the areas targeted by this assay, and inadequate number of viral copies (<250 copies / mL). A negative result must be combined with clinical observations, patient history, and epidemiological information.  Fact Sheet for Patients:   StrictlyIdeas.no  Fact Sheet for Healthcare Providers: BankingDealers.co.za  This test is not yet approved or  cleared by the [place] FDA and has been authorized for detection and/or diagnosis of SARS-CoV-2 by FDA under an Emergency Use Authorization (EUA).  This EUA will remain in effect (meaning this test can be used) for the duration of the COVID-19 declaration under Section 564(b)(1) of the Act, 21 U.S.C. section 360bbb-3(b)(1), unless the authorization is terminated or revoked sooner.  Performed at [facility], [place] [address], [place], [place] [address]   Blood Culture (routine x 2)     Status: Abnormal   Collection Time: 01/23/20 10:17 AM   Specimen: BLOOD RIGHT ARM  Result Value Ref Range Status   Specimen Description BLOOD RIGHT ARM  Final   Special Requests   Final    BOTTLES DRAWN AEROBIC AND ANAEROBIC Blood Culture results may not be optimal due to an inadequate volume of blood received in  culture bottles   Culture  Setup Time   Final    GRAM POSITIVE COCCI IN BOTH AEROBIC AND ANAEROBIC BOTTLES Organism ID to follow CRITICAL RESULT CALLED TO, READ BACK BY AND VERIFIED WITH: K AMEND PHARMD 01/24/20 0221 JDW    Culture (A)  Final    STAPHYLOCOCCUS EPIDERMIDIS STAPHYLOCOCCUS LUGDUNENSIS THE SIGNIFICANCE OF ISOLATING THIS ORGANISM FROM A SINGLE SET OF BLOOD CULTURES WHEN MULTIPLE SETS ARE DRAWN IS UNCERTAIN. PLEASE NOTIFY THE MICROBIOLOGY DEPARTMENT WITHIN ONE WEEK IF SPECIATION AND SENSITIVITIES ARE REQUIRED. Performed at [facility], [place] [address] [place], [place] [address]    Report Status 01/25/2020 FINAL  Final  Blood Culture ID Panel (Reflexed)     Status: Abnormal   Collection Time: 01/23/20 10:17 AM  Result Value Ref Range Status   Enterococcus faecalis NOT DETECTED NOT DETECTED Final   Enterococcus Faecium NOT DETECTED NOT DETECTED Final   Listeria monocytogenes NOT DETECTED NOT DETECTED Final   Staphylococcus species DETECTED (A) NOT DETECTED Final    Comment: CRITICAL RESULT CALLED TO, READ BACK BY AND VERIFIED WITH:  K AMEND PHARMD 01/24/20 0221 JDW    Staphylococcus aureus (BCID) NOT DETECTED NOT DETECTED Final   Staphylococcus epidermidis DETECTED (A) NOT DETECTED Final    Comment: Methicillin (oxacillin) resistant coagulase negative staphylococcus. Possible blood culture contaminant (unless isolated from more than one blood culture draw or clinical case suggests pathogenicity). No antibiotic treatment is indicated for blood  culture contaminants. CRITICAL RESULT CALLED TO, READ BACK BY AND VERIFIED WITH: K AMEND PHARMD 01/24/20 0221 JDW    Staphylococcus lugdunensis DETECTED (A) NOT DETECTED Final    Comment: Methicillin (oxacillin) resistant coagulase negative staphylococcus. Possible blood culture contaminant (unless isolated from more than one blood culture draw or clinical case suggests pathogenicity). No antibiotic treatment is indicated  for blood   culture contaminants. CRITICAL RESULT CALLED TO, READ BACK BY AND VERIFIED WITH: K AMEND PHARMD 01/24/20 0221 JDW    Streptococcus species NOT DETECTED NOT DETECTED Final   Streptococcus agalactiae NOT DETECTED NOT DETECTED Final   Streptococcus pneumoniae NOT DETECTED NOT DETECTED Final   Streptococcus pyogenes NOT DETECTED NOT DETECTED Final   A.calcoaceticus-baumannii NOT DETECTED NOT DETECTED Final   Bacteroides fragilis NOT DETECTED NOT DETECTED Final   Enterobacterales NOT DETECTED NOT DETECTED Final   Enterobacter cloacae complex NOT DETECTED NOT DETECTED Final   Escherichia coli NOT DETECTED NOT DETECTED Final   Klebsiella aerogenes NOT DETECTED NOT DETECTED Final   Klebsiella oxytoca NOT DETECTED NOT DETECTED Final   Klebsiella pneumoniae NOT DETECTED NOT DETECTED Final   Proteus species NOT DETECTED NOT DETECTED Final   Salmonella species NOT DETECTED NOT DETECTED Final   Serratia marcescens NOT DETECTED NOT DETECTED Final   Haemophilus influenzae NOT DETECTED NOT DETECTED Final   Neisseria meningitidis NOT DETECTED NOT DETECTED Final   Pseudomonas aeruginosa NOT DETECTED NOT DETECTED Final   Stenotrophomonas maltophilia NOT DETECTED NOT DETECTED Final   Candida albicans NOT DETECTED NOT DETECTED Final   Candida auris NOT DETECTED NOT DETECTED Final   Candida glabrata NOT DETECTED NOT DETECTED Final   Candida krusei NOT DETECTED NOT DETECTED Final   Candida parapsilosis NOT DETECTED NOT DETECTED Final   Candida tropicalis NOT DETECTED NOT DETECTED Final   Cryptococcus neoformans/gattii NOT DETECTED NOT DETECTED Final   Methicillin resistance mecA/C DETECTED (A) NOT DETECTED Final    Comment: CRITICAL RESULT CALLED TO, READ BACK BY AND VERIFIED WITH: K AMEND [facility] 01/24/20 0221 JDW Performed at [facility], [address], [place], [place] [address]   Blood Culture (routine x 2)     Status: None (Preliminary result)   Collection Time: 01/23/20  2:47 PM    Specimen: BLOOD  Result Value Ref Range Status   Specimen Description BLOOD RIGHT ANTECUBITAL  Final   Special Requests   Final    BOTTLES DRAWN AEROBIC AND ANAEROBIC Blood Culture adequate volume   Culture   Final    NO GROWTH 2 DAYS Performed at [facility], [place] [address] [place], [place] [address]    Report Status PENDING  Incomplete  Culture, Urine     Status: Abnormal   Collection Time: 01/23/20  4:44 PM   Specimen: Urine, Random  Result Value Ref Range Status   Specimen Description URINE, RANDOM  Final   Special Requests   Final    NONE Performed at [facility], [place] [address] [place],  [address]    Culture MULTIPLE SPECIES PRESENT, SUGGEST RECOLLECTION (A)  Final   Report Status 01/24/2020 FINAL  Final  SARS Coronavirus 2 by RT PCR (hospital order, performed in [facility] hospital lab) Nasopharyngeal Nasopharyngeal Swab     Status: None   Collection Time: 01/24/20  2:31 AM   Specimen: Nasopharyngeal Swab  Result Value Ref Range Status   SARS Coronavirus 2 NEGATIVE NEGATIVE Final    Comment: (NOTE) SARS-CoV-2 target nucleic acids are NOT DETECTED.  The SARS-CoV-2 RNA is generally detectable in upper and lower respiratory specimens during the acute phase of infection. The lowest concentration of SARS-CoV-2 viral copies this assay can detect is 250 copies / mL. A negative result does not preclude SARS-CoV-2 infection and should not be used as the sole basis for treatment or other patient management decisions.  A negative  result may occur with improper specimen collection / handling, submission of specimen other than nasopharyngeal swab, presence of viral mutation(s) within the areas targeted by this assay, and inadequate number of viral copies (<250 copies / mL). A negative result must be combined with clinical observations, patient history, and epidemiological information.  Fact Sheet for Patients:    StrictlyIdeas.no  Fact Sheet for Healthcare Providers: BankingDealers.co.za  This test is not yet approved or  cleared by the [place] FDA and has been authorized for detection and/or diagnosis of SARS-CoV-2 by FDA under an Emergency Use Authorization (EUA).  This EUA will remain in effect (meaning this test can be used) for the duration of the COVID-19 declaration under Section 564(b)(1) of the Act, 21 U.S.C. section 360bbb-3(b)(1), unless the authorization is terminated or revoked sooner.  Performed at [facility], [place] [address], [place], [place] [address]   Respiratory Panel by PCR     Status: None   Collection Time: 01/24/20  6:13 PM   Specimen: Nasopharyngeal Swab; Respiratory  Result Value Ref Range Status   Adenovirus NOT DETECTED NOT DETECTED Final   Coronavirus 229E NOT DETECTED NOT DETECTED Final    Comment: (NOTE) The Coronavirus on the Respiratory Panel, DOES NOT test for the novel  Coronavirus (2019 nCoV)    Coronavirus HKU1 NOT DETECTED NOT DETECTED Final   Coronavirus NL63 NOT DETECTED NOT DETECTED Final   Coronavirus OC43 NOT DETECTED NOT DETECTED Final   Metapneumovirus NOT DETECTED NOT DETECTED Final   Rhinovirus / Enterovirus NOT DETECTED NOT DETECTED Final   Influenza A NOT DETECTED NOT DETECTED Final   Influenza B NOT DETECTED NOT DETECTED Final   Parainfluenza Virus 1 NOT DETECTED NOT DETECTED Final   Parainfluenza Virus 2 NOT DETECTED NOT DETECTED Final   Parainfluenza Virus 3 NOT DETECTED NOT DETECTED Final   Parainfluenza Virus 4 NOT DETECTED NOT DETECTED Final   Respiratory Syncytial Virus NOT DETECTED NOT DETECTED Final   Bordetella pertussis NOT DETECTED NOT DETECTED Final   Chlamydophila pneumoniae NOT DETECTED NOT DETECTED Final   Mycoplasma pneumoniae NOT DETECTED NOT DETECTED Final    Comment: Performed at [facility], [place], [place], [place] [address]    RADIOLOGY  STUDIES/RESULTS: DG Chest Port 1 View  Result Date: 01/25/2020 CLINICAL DATA:  Shortness of breath EXAM: PORTABLE CHEST 1 VIEW COMPARISON:  01/23/2020 FINDINGS: Cardiomegaly. Patchy peripheral opacities seen in the right upper lobe and right lower lobe, similar to prior study. No definite confluent opacity on the left. No effusions or pneumothorax. No acute bony abnormality. IMPRESSION: Patchy airspace opacities in the right lung, question pneumonia. Cardiomegaly. Electronically Signed   By: Rolm Baptise M.D.   On: 01/25/2020 07:28     LOS: 3 days   Oren Binet, MD  [facility]    To contact the attending provider between 7A-7P or the covering provider during after hours 7P-7A, please log into the web site [email or web address] and access using universal [place] password for that web site. If you do not have the password, please call the hospital operator.  01/26/2020, 11:19 AM

## 2020-01-27 DIAGNOSIS — J189 Pneumonia, unspecified organism: Principal | ICD-10-CM

## 2020-01-27 LAB — BASIC METABOLIC PANEL
Anion gap: 8 (ref 5–15)
BUN: 11 mg/dL (ref 8–23)
CO2: 25 mmol/L (ref 22–32)
Calcium: 9.2 mg/dL (ref 8.9–10.3)
Chloride: 104 mmol/L (ref 98–111)
Creatinine, Ser: 0.95 mg/dL (ref 0.44–1.00)
GFR calc Af Amer: 60 mL/min — ABNORMAL LOW (ref >60)
GFR calc non Af Amer: 52 mL/min — ABNORMAL LOW (ref >60)
Glucose, Bld: 83 mg/dL (ref 70–99)
Potassium: 3.7 mmol/L (ref 3.5–5.1)
Sodium: 137 mmol/L (ref 135–145)

## 2020-01-27 MED ORDER — SODIUM CHLORIDE 0.9 % IV SOLN
1.0000 g | INTRAVENOUS | Status: AC
  Administered 2020-01-27: 1 g via INTRAVENOUS
  Filled 2020-01-27: qty 1

## 2020-01-27 MED ORDER — SALINE SPRAY 0.65 % NA SOLN
1.0000 | NASAL | Status: DC | PRN
  Administered 2020-01-27: 1 via NASAL
  Filled 2020-01-27: qty 44

## 2020-01-27 MED ORDER — FERROUS SULFATE 325 (65 FE) MG PO TABS: 325.0000 mg | ORAL_TABLET | Freq: Two times a day (BID) | ORAL | 0 refills | Status: AC

## 2020-01-27 NOTE — TOC Progression Note (Signed)
Transition of Care [facility]) - Progression Note    Patient Details  Name: Katrina Burnett MRN: [id number] Date of Birth: January 02, 1925  Transition of Care [facility]) CM/SW [place], [place] Phone Number: 01/27/2020, 10:38 AM  Clinical Narrative:    CSW spoke with patient's daughter [name] and confirmed patient is returning to [place] at [employer]. Daughter aware of discharge. Patient needs transportation home, CSW informed RNCM.   Expected Discharge Plan: Assisted Living Barriers to Discharge: Continued Medical Work up  Expected Discharge Plan and Services Expected Discharge Plan: Assisted Living     Post Acute Care Choice: [place] arrangements for the past 2 months: [place] ([facility]) Expected Discharge Date: 01/27/20               DME Arranged: N/A         HH Arranged: PT, OT HH Agency: Other - See comment ([facility] to provide [place] OT/PT) Date HH Agency Contacted: 01/25/20 Time [place]: 1400 Representative spoke with at [place]: [place] (SDOH) Interventions    Readmission Risk Interventions No flowsheet data found.

## 2020-01-27 NOTE — Plan of Care (Signed)

## 2020-01-27 NOTE — TOC Transition Note (Addendum)
Transition of Care [facility]) - CM/SW Discharge Note   Patient Details  Name: Katrina Burnett MRN: [id number] Date of Birth: 1924/06/14  [facility]) CM/SW Contact:  Carles Collet, RN Phone Number: 01/27/2020, 10:28 AM   Clinical Narrative:    Faxed [place] orders to Philippa Sicks at [place]. Notified Rollene Fare to expect Fax, and that patient will DC today.  Spoke w patient's son in [employer]. He states he has notified [facility], confirmed address, and arranged for sitter services to resume. Family requesting PTAR. PTAR scheduled for 2:30. Forms taken to nurse's station, requested RN to verify DNR on chart.     Barriers to Discharge: Continued Medical Work up   Patient Goals and CMS Choice Patient states their goals for this hospitalization and ongoing recovery are:: "I'm going to wait and see"      Discharge Placement                       Discharge Plan and Services     Post Acute Care Choice: Home Health          DME Arranged: N/A         HH Arranged: PT, OT [place] Agency: Other - See comment ([facility] to provide [place] OT/PT) Date HH Agency Contacted: 01/25/20 Time HH Agency Contacted: 1400 Representative spoke with at [place]: [place] (SDOH) Interventions     Readmission Risk Interventions No flowsheet data found.

## 2020-01-27 NOTE — Discharge Summary (Signed)
PATIENT DETAILS Name: Katrina Burnett Age: 86 y.o. Sex: female Date of Birth: 02-22-1925 MRN: [id number]. Admitting Physician: Vernelle Emerald, MD PCP:Kim, Jeneen Rinks, MD  Admit Date: 01/23/2020 Discharge date: 01/27/2020  Recommendations for Outpatient Follow-up:  1. Follow up with PCP in 1-2 weeks 2. Please obtain CMP/CBC in one week 3. Please see discussion below regarding anemia. 4. Please follow up on the following pending results:blood culture done on 9/17-negative at the time of discharge.  Admitted From:  Home  Disposition: Home with home health services    [place]: Yes  Equipment/Devices: None  Discharge Condition: Stable  CODE STATUS: FULL CODE  Diet recommendation:  Diet Order            Diet - low sodium heart healthy           Diet regular Room service appropriate? Yes; Fluid consistency: Thin  Diet effective now                  Brief Narrative: Patient is a 86 y.o. female chronic lower back pain, CKD stage IIIa, diverticulosis, depression/anxiety-resident of a local independent living facility-presenting to the ED on 9/15 after not feeling well, some cough and subjective fever.  Her Covid antigen test in the independent living facility was positive-however Covid PCR x2 on admission was negative.  Significant events: 9/15>> admit to [facility] complaining of not feeling well, cough, sore throat-tested positive for COVID-19 at independent living facility.  COVID-19 PCR x2 - on admission  COVID-19 vaccination status: Fully vaccinated  Significant studies: 9/15>> chest x-ray: Questionable early pneumonia in the right upper lobe.   Antimicrobial therapy: Rocephin: 9/15>>9/19 Zithromax: 9/15>>9/18  Microbiology data: 9/17>> blood culture: negative at the time of discharge-PCP to follow until final. 9/15>> blood culture: 1/2 staph epidermidis, staph lugdunensis. 9/15>> urine culture: Multiple species  Procedures  : None  Consults: None  Brief Hospital Course: Acute hypoxic respiratory failure secondary to community-acquired pneumonia: Initial concern for COVID-19 related pneumonia-however COVID-19 PCR x2 neg.  Suspect she had a false positive antigen test at her facility for COVID-19.  Had very mild hypoxemia on initial presentation-but has been transitioned to room air-she was ambulated by therapy services today-O2 saturations were in the 90s well.  Respiratory virus panel negative.   Has completed 3 days of Zithromax-and will complete 5 days of Rocephin on 9/19-does not require any further antimicrobial therapy on discharge.  Coag negative staph bacteremia: 1 out of 2 set of blood cultures drawn on admission-positive for staph epidermidis and staph lugdunensis-patient has hardware in her right hip.  Has been having pain in the right hip/pelvic area for the past few weeks-necessitating orthopedic this visit as an outpatient.  Per orthopedic MD-no obvious fractures on imaging studies that were done as an outpatient.  Case was discussed with Dr. Carollee Herter disease MD on-call on 9/17-recommendations were to repeat blood cultures-if they were negative-these were likely contaminant without any clinical significance.  However if repeat cultures were still positive for coag negative staph-may require treatment.  Thankfully repeat blood cultures on 9/17 are negative so far-likely signifying initial blood cultures to be a contaminant.  Have asked patient's daughter-Susan-to make sure PCP follows blood cultures until final.  Is in the unlikely event her blood cultures come back positive post discharge-she will probably need to return to the hospital for IV antimicrobial therapy.  Normocytic anemia: Likely iron deficiency anemia-denies any recent history of melena/hematochezia.  Stool FOBT negative.  No indication for transfusion-had severe esophagitis  in EGD done on July 2021.  Since not actively  bleeding-suspect does not require any further endoscopic evaluation while inpatient.  Patient is 86 years old-suspect may benefit from conservative medical care rather than further endoscopic evaluation-I will defer further to patient's PCP.  Patient will be discharged on iron supplementation.  CKD stage IIIa: Creatinine close to baseline-follow  Recent history of esophagitis: Remains on PPI  ??Recent pelvic fracture: For the past few weeks-patient is at increased pain in her pelvic area-she has been seen by Dr. Donley Redder in the outpatient setting.  I spoke with Dr. Mayer Camel on 9/16-her x-rays were negative but he suspected a possible small pelvic fracture-he was contemplating doing a nuclear bone scan-but given that it is unlikely can change management in a 86 year old patient-we both agree not to pursue while inpatient.    History of chronic low back pain/right hip pain/pelvic pain: Chronic issue-s/p right hip replacement/kyphoplasty-continue usual narcotic regimen-Flexeril and Neurontin and other supportive measures.    Constipation: Continue MiraLAX  Depression/anxiety: Continue trazodone, Celexa and as needed Ativan.   Discharge Diagnoses:  Principal Problem:   Acute hypoxemic respiratory failure (HCC) Active Problems:   GERD (gastroesophageal reflux disease)   Acute on chronic anemia   Depression   Thrombocytosis (HCC)   Acute respiratory failure with hypoxia [facility])   Discharge Instructions:  Activity:  As tolerated with Full fall precautions use walker/cane & assistance as needed  Discharge Instructions    Call MD for:  difficulty breathing, headache or visual disturbances   Complete by: As directed    Call MD for:  extreme fatigue   Complete by: As directed    Call MD for:  persistant dizziness or light-headedness   Complete by: As directed    Diet - low sodium heart healthy   Complete by: As directed    Discharge instructions   Complete by: As directed     Follow with Primary MD  Jani Gravel, MD in 1-2 weeks  Please ask your primary care practitioner to follow blood cultures drawn on 9/17 until final.  They were negative at the time of discharge.  Continue iron supplementation-please ask your primary care practitioner to follow your complete blood count-you have developed anemia.  Please get a complete blood count and chemistry panel checked by your Primary MD at your next visit, and again as instructed by your Primary MD.  Get Medicines reviewed and adjusted: Please take all your medications with you for your next visit with your Primary MD  Laboratory/radiological data: Please request your Primary MD to go over all hospital tests and procedure/radiological results at the follow up, please ask your Primary MD to get all Hospital records sent to his/her office.  In some cases, they will be blood work, cultures and biopsy results pending at the time of your discharge. Please request that your primary care M.D. follows up on these results.  Also Note the following: If you experience worsening of your admission symptoms, develop shortness of breath, life threatening emergency, suicidal or homicidal thoughts you must seek medical attention immediately by calling 911 or calling your MD immediately  if symptoms less severe.  You must read complete instructions/literature along with all the possible adverse reactions/side effects for all the Medicines you take and that have been prescribed to you. Take any new Medicines after you have completely understood and accpet all the possible adverse reactions/side effects.   Do not drive when taking Pain medications or sleeping medications (Benzodaizepines)  Do  not take more than prescribed Pain, Sleep and Anxiety Medications. It is not advisable to combine anxiety,sleep and pain medications without talking with your primary care practitioner  Special Instructions: If you have smoked or chewed Tobacco  in the  last 2 yrs please stop smoking, stop any regular Alcohol  and or any Recreational drug use.  Wear Seat belts while driving.  Please note: You were cared for by a hospitalist during your hospital stay. Once you are discharged, your primary care physician will handle any further medical issues. Please note that NO REFILLS for any discharge medications will be authorized once you are discharged, as it is imperative that you return to your primary care physician (or establish a relationship with a primary care physician if you do not have one) for your post hospital discharge needs so that they can reassess your need for medications and monitor your lab values.   Increase activity slowly   Complete by: As directed      Allergies as of 01/27/2020      Reactions   Avelox [moxifloxacin Hcl In Nacl] Other (See Comments)   Felt bad   Biaxin [clarithromycin] Nausea Only   Cefuroxime Diarrhea   Levaquin [levofloxacin In D5w] Diarrhea   Minocycline Nausea And Vomiting   Paxil [paroxetine Hcl] Nausea And Vomiting   Prednisone Nausea Only   Remeron [mirtazapine]    Doesn't remember    Sulfur Nausea And Vomiting   Trimox [amoxicillin] Other (See Comments)   Doesn't remember  Has patient had a PCN reaction causing immediate rash, facial/tongue/throat swelling, SOB or lightheadedness with hypotension: Unknown Has patient had a PCN reaction causing severe rash involving mucus membranes or skin necrosis: Unknown Has patient had a PCN reaction that required hospitalization: Unknown Has patient had a PCN reaction occurring within the last 10 years: Unknown If all of the above answers are "NO", then may proceed with Cephalosporin use.   Viibryd [vilazodone Hcl] Other (See Comments)   Doesn't remember       Medication List    TAKE these medications   Align 4 MG Caps Take 4 mg by mouth at bedtime.   citalopram 40 MG tablet Commonly known as: CELEXA Take 40 mg by mouth daily.   cyclobenzaprine 10  MG tablet Commonly known as: FLEXERIL Take 10 mg by mouth every 8 (eight) hours as needed for muscle spasms.   dorzolamide 2 % ophthalmic solution Commonly known as: TRUSOPT Place 1 drop into both eyes 2 (two) times daily.   ferrous sulfate 325 (65 FE) MG tablet Take 1 tablet (325 mg total) by mouth 2 (two) times daily with a meal.   gabapentin 300 MG capsule Commonly known as: NEURONTIN Take 600 mg by mouth at bedtime.   latanoprost 0.005 % ophthalmic solution Commonly known as: XALATAN Place 1 drop into both eyes at bedtime.   LORazepam 2 MG tablet Commonly known as: ATIVAN Take 2 mg by mouth in the morning, at noon, and at bedtime.   metoCLOPramide 10 MG tablet Commonly known as: REGLAN Take 1 tablet (10 mg total) by mouth every 6 (six) hours as needed for nausea (headache, recommend taking with benadryl (diphenhydramine) 25mg ).   multivitamin tablet Take 1 tablet by mouth at bedtime.   oxyCODONE-acetaminophen 10-325 MG tablet Commonly known as: PERCOCET Take 1 tablet by mouth every 6 (six) hours as needed for pain.   pantoprazole 40 MG tablet Commonly known as: Protonix Take 1 tablet (40 mg total) by mouth daily.  polyethylene glycol 17 g packet Commonly known as: MIRALAX / GLYCOLAX Take 17 g by mouth daily as needed for mild constipation.   potassium chloride SA 20 MEQ tablet Commonly known as: KLOR-CON Take 1 tablet (20 mEq total) by mouth 2 (two) times daily.   sucralfate 1 GM/10ML suspension Commonly known as: Carafate Take 10 mLs (1 g total) by mouth 3 (three) times daily as needed.   traZODone 50 MG tablet Commonly known as: DESYREL Take 0.5-1 tablets (25-50 mg total) by mouth at bedtime as needed for sleep. What changed:   how much to take  when to take this       Allergies  Allergen Reactions  . Avelox [Moxifloxacin Hcl In Nacl] Other (See Comments)    Felt bad  . Biaxin [Clarithromycin] Nausea Only  . Cefuroxime Diarrhea  . Levaquin  [Levofloxacin In D5w] Diarrhea  . Minocycline Nausea And Vomiting  . Paxil [Paroxetine Hcl] Nausea And Vomiting  . Prednisone Nausea Only  . Remeron [Mirtazapine]     Doesn't remember   . Sulfur Nausea And Vomiting  . Trimox [Amoxicillin] Other (See Comments)    Doesn't remember  Has patient had a PCN reaction causing immediate rash, facial/tongue/throat swelling, SOB or lightheadedness with hypotension: Unknown Has patient had a PCN reaction causing severe rash involving mucus membranes or skin necrosis: Unknown Has patient had a PCN reaction that required hospitalization: Unknown Has patient had a PCN reaction occurring within the last 10 years: Unknown If all of the above answers are "NO", then may proceed with Cephalosporin use.  Arman Filter Hcl] Other (See Comments)    Doesn't remember        Other Procedures/Studies: DG Chest Port 1 View  Result Date: 01/25/2020 CLINICAL DATA:  Shortness of breath EXAM: PORTABLE CHEST 1 VIEW COMPARISON:  01/23/2020 FINDINGS: Cardiomegaly. Patchy peripheral opacities seen in the right upper lobe and right lower lobe, similar to prior study. No definite confluent opacity on the left. No effusions or pneumothorax. No acute bony abnormality. IMPRESSION: Patchy airspace opacities in the right lung, question pneumonia. Cardiomegaly. Electronically Signed   By: Rolm Baptise M.D.   On: 01/25/2020 07:28   DG Chest Port 1 View  Result Date: 01/23/2020 CLINICAL DATA:  Cough and shortness of breath EXAM: PORTABLE CHEST 1 VIEW COMPARISON:  October 26, 2019 FINDINGS: There is a focal area of atelectasis and questionable associated infiltrate in the right upper lobe. There is mild atelectasis in the bases. No airspace consolidation. There is cardiomegaly with pulmonary vascularity normal. No adenopathy. There is a focal hiatal hernia. Bones are osteoporotic. Multiple compression fractures in the thoracic and lumbar region with evidence of prior kyphoplasty  procedures at several levels. IMPRESSION: Atelectasis with questionable early pneumonia right upper lobe. Atelectatic change noted in the lung bases. Heart is mildly enlarged with pulmonary vascularity normal. Hiatal hernia present. Osteoporosis, status post kyphoplasty procedures in the midthoracic and upper lumbar regions. Electronically Signed   By: Lowella Grip III M.D.   On: 01/23/2020 10:38     TODAY-DAY OF DISCHARGE:  Subjective:   Katrina Burnett today has no headache,no chest abdominal pain,no new weakness tingling or numbness, feels much better wants to go home today.   Objective:   Blood pressure (!) 163/83, pulse 87, temperature 97.7 F (36.5 C), resp. rate (!) 23, height 5\' 2"  (1.575 m), weight 46.4 kg, SpO2 (!) 89 %. No intake or output data in the 24 hours ending 01/27/20 [place]  01/26/20 0406  Weight: 46.4 kg    Exam: Awake Alert, Oriented *3, No new F.N deficits, Normal affect [place].AT,PERRAL Supple Neck,No JVD, No cervical lymphadenopathy appriciated.  Symmetrical Chest wall movement, Good air movement bilaterally, CTAB RRR,No Gallops,Rubs or new Murmurs, No Parasternal Heave +ve B.Sounds, Abd Soft, Non tender, No organomegaly appriciated, No rebound -guarding or rigidity. No Cyanosis, Clubbing or edema, No new Rash or bruise   PERTINENT RADIOLOGIC STUDIES: No results found.   PERTINENT LAB RESULTS: CBC: Recent Labs    01/25/20 0108  WBC 13.7*  HGB 8.0*  HCT 26.9*  PLT 587*   CMET CMP     Component Value Date/Time   NA 137 01/27/2020 0528   K 3.7 01/27/2020 0528   CL 104 01/27/2020 0528   CO2 25 01/27/2020 0528   GLUCOSE 83 01/27/2020 0528   BUN 11 01/27/2020 0528   CREATININE 0.95 01/27/2020 0528   CALCIUM 9.2 01/27/2020 0528   PROT 5.1 (L) 01/24/2020 0232   ALBUMIN 2.4 (L) 01/24/2020 0232   AST 18 01/24/2020 0232   ALT 11 01/24/2020 0232   ALKPHOS 76 01/24/2020 0232   BILITOT 0.6 01/24/2020 0232   GFRNONAA 52 (L) 01/27/2020  0528   GFRAA 60 (L) 01/27/2020 0528    GFR Estimated Creatinine Clearance: 27.1 mL/min (by C-G formula based on SCr of 0.95 mg/dL). No results for input(s): LIPASE, AMYLASE in the last 72 hours. No results for input(s): CKTOTAL, CKMB, CKMBINDEX, TROPONINI in the last 72 hours. Invalid input(s): POCBNP No results for input(s): DDIMER in the last 72 hours. No results for input(s): HGBA1C in the last 72 hours. No results for input(s): CHOL, HDL, LDLCALC, TRIG, CHOLHDL, LDLDIRECT in the last 72 hours. No results for input(s): TSH, T4TOTAL, T3FREE, THYROIDAB in the last 72 hours.  Invalid input(s): FREET3 No results for input(s): VITAMINB12, FOLATE, FERRITIN, TIBC, IRON, RETICCTPCT in the last 72 hours. Coags: No results for input(s): INR in the last 72 hours.  Invalid input(s): PT Microbiology: Recent Results (from the past 240 hour(s))  SARS Coronavirus 2 by RT PCR (hospital order, performed in [facility] hospital lab) Nasopharyngeal Nasopharyngeal Swab     Status: None   Collection Time: 01/23/20 10:06 AM   Specimen: Nasopharyngeal Swab  Result Value Ref Range Status   SARS Coronavirus 2 NEGATIVE NEGATIVE Final    Comment: (NOTE) SARS-CoV-2 target nucleic acids are NOT DETECTED.  The SARS-CoV-2 RNA is generally detectable in upper and lower respiratory specimens during the acute phase of infection. The lowest concentration of SARS-CoV-2 viral copies this assay can detect is 250 copies / mL. A negative result does not preclude SARS-CoV-2 infection and should not be used as the sole basis for treatment or other patient management decisions.  A negative result may occur with improper specimen collection / handling, submission of specimen other than nasopharyngeal swab, presence of viral mutation(s) within the areas targeted by this assay, and inadequate number of viral copies (<250 copies / mL). A negative result must be combined with clinical observations, patient history, and  epidemiological information.  Fact Sheet for Patients:   StrictlyIdeas.no  Fact Sheet for Healthcare Providers: BankingDealers.co.za  This test is not yet approved or  cleared by the [place] FDA and has been authorized for detection and/or diagnosis of SARS-CoV-2 by FDA under an Emergency Use Authorization (EUA).  This EUA will remain in effect (meaning this test can be used) for the duration of the COVID-19 declaration under Section 564(b)(1) of the Act,  21 U.S.C. section 360bbb-3(b)(1), unless the authorization is terminated or revoked sooner.  Performed at [facility], [place] [address] [place], [place] [address]   Blood Culture (routine x 2)     Status: Abnormal   Collection Time: 01/23/20 10:17 AM   Specimen: BLOOD RIGHT ARM  Result Value Ref Range Status   Specimen Description BLOOD RIGHT ARM  Final   Special Requests   Final    BOTTLES DRAWN AEROBIC AND ANAEROBIC Blood Culture results may not be optimal due to an inadequate volume of blood received in culture bottles   Culture  Setup Time   Final    GRAM POSITIVE COCCI IN BOTH AEROBIC AND ANAEROBIC BOTTLES Organism ID to follow CRITICAL RESULT CALLED TO, READ BACK BY AND VERIFIED WITH: K AMEND PHARMD 01/24/20 0221 JDW    Culture (A)  Final    STAPHYLOCOCCUS EPIDERMIDIS STAPHYLOCOCCUS LUGDUNENSIS THE SIGNIFICANCE OF ISOLATING THIS ORGANISM FROM A SINGLE SET OF BLOOD CULTURES WHEN MULTIPLE SETS ARE DRAWN IS UNCERTAIN. PLEASE NOTIFY THE MICROBIOLOGY DEPARTMENT WITHIN ONE WEEK IF SPECIATION AND SENSITIVITIES ARE REQUIRED. Performed at [facility], [place] [address], [place], [place] [address]    Report Status 01/25/2020 FINAL  Final  Blood Culture ID Panel (Reflexed)     Status: Abnormal   Collection Time: 01/23/20 10:17 AM  Result Value Ref Range Status   Enterococcus faecalis NOT DETECTED NOT DETECTED Final   Enterococcus Faecium NOT DETECTED NOT DETECTED  Final   Listeria monocytogenes NOT DETECTED NOT DETECTED Final   Staphylococcus species DETECTED (A) NOT DETECTED Final    Comment: CRITICAL RESULT CALLED TO, READ BACK BY AND VERIFIED WITH:  K AMEND PHARMD 01/24/20 0221 JDW    Staphylococcus aureus (BCID) NOT DETECTED NOT DETECTED Final   Staphylococcus epidermidis DETECTED (A) NOT DETECTED Final    Comment: Methicillin (oxacillin) resistant coagulase negative staphylococcus. Possible blood culture contaminant (unless isolated from more than one blood culture draw or clinical case suggests pathogenicity). No antibiotic treatment is indicated for blood  culture contaminants. CRITICAL RESULT CALLED TO, READ BACK BY AND VERIFIED WITH: K AMEND PHARMD 01/24/20 0221 JDW    Staphylococcus lugdunensis DETECTED (A) NOT DETECTED Final    Comment: Methicillin (oxacillin) resistant coagulase negative staphylococcus. Possible blood culture contaminant (unless isolated from more than one blood culture draw or clinical case suggests pathogenicity). No antibiotic treatment is indicated for blood  culture contaminants. CRITICAL RESULT CALLED TO, READ BACK BY AND VERIFIED WITH: K AMEND PHARMD 01/24/20 0221 JDW    Streptococcus species NOT DETECTED NOT DETECTED Final   Streptococcus agalactiae NOT DETECTED NOT DETECTED Final   Streptococcus pneumoniae NOT DETECTED NOT DETECTED Final   Streptococcus pyogenes NOT DETECTED NOT DETECTED Final   A.calcoaceticus-baumannii NOT DETECTED NOT DETECTED Final   Bacteroides fragilis NOT DETECTED NOT DETECTED Final   Enterobacterales NOT DETECTED NOT DETECTED Final   Enterobacter cloacae complex NOT DETECTED NOT DETECTED Final   Escherichia coli NOT DETECTED NOT DETECTED Final   Klebsiella aerogenes NOT DETECTED NOT DETECTED Final   Klebsiella oxytoca NOT DETECTED NOT DETECTED Final   Klebsiella pneumoniae NOT DETECTED NOT DETECTED Final   Proteus species NOT DETECTED NOT DETECTED Final   Salmonella species NOT  DETECTED NOT DETECTED Final   Serratia marcescens NOT DETECTED NOT DETECTED Final   Haemophilus influenzae NOT DETECTED NOT DETECTED Final   Neisseria meningitidis NOT DETECTED NOT DETECTED Final   Pseudomonas aeruginosa NOT DETECTED NOT DETECTED Final   Stenotrophomonas maltophilia NOT  DETECTED NOT DETECTED Final   Candida albicans NOT DETECTED NOT DETECTED Final   Candida auris NOT DETECTED NOT DETECTED Final   Candida glabrata NOT DETECTED NOT DETECTED Final   Candida krusei NOT DETECTED NOT DETECTED Final   Candida parapsilosis NOT DETECTED NOT DETECTED Final   Candida tropicalis NOT DETECTED NOT DETECTED Final   Cryptococcus neoformans/gattii NOT DETECTED NOT DETECTED Final   Methicillin resistance mecA/C DETECTED (A) NOT DETECTED Final    Comment: CRITICAL RESULT CALLED TO, READ BACK BY AND VERIFIED WITH: K AMEND [facility] 01/24/20 0221 JDW Performed at [facility], [address], [place], [place] [address]   Blood Culture (routine x 2)     Status: None (Preliminary result)   Collection Time: 01/23/20  2:47 PM   Specimen: BLOOD  Result Value Ref Range Status   Specimen Description BLOOD RIGHT ANTECUBITAL  Final   Special Requests   Final    BOTTLES DRAWN AEROBIC AND ANAEROBIC Blood Culture adequate volume   Culture   Final    NO GROWTH 3 DAYS Performed at [facility], [place] [address] [place], [place] [address]    Report Status PENDING  Incomplete  Culture, Urine     Status: Abnormal   Collection Time: 01/23/20  4:44 PM   Specimen: Urine, Random  Result Value Ref Range Status   Specimen Description URINE, RANDOM  Final   Special Requests   Final    NONE Performed at [facility], [place] [address], [place], [place] [address]    Culture MULTIPLE SPECIES PRESENT, SUGGEST RECOLLECTION (A)  Final   Report Status 01/24/2020 FINAL  Final  SARS Coronavirus 2 by RT PCR (hospital order, performed in [facility] hospital lab) Nasopharyngeal Nasopharyngeal Swab      Status: None   Collection Time: 01/24/20  2:31 AM   Specimen: Nasopharyngeal Swab  Result Value Ref Range Status   SARS Coronavirus 2 NEGATIVE NEGATIVE Final    Comment: (NOTE) SARS-CoV-2 target nucleic acids are NOT DETECTED.  The SARS-CoV-2 RNA is generally detectable in upper and lower respiratory specimens during the acute phase of infection. The lowest concentration of SARS-CoV-2 viral copies this assay can detect is 250 copies / mL. A negative result does not preclude SARS-CoV-2 infection and should not be used as the sole basis for treatment or other patient management decisions.  A negative result may occur with improper specimen collection / handling, submission of specimen other than nasopharyngeal swab, presence of viral mutation(s) within the areas targeted by this assay, and inadequate number of viral copies (<250 copies / mL). A negative result must be combined with clinical observations, patient history, and epidemiological information.  Fact Sheet for Patients:   StrictlyIdeas.no  Fact Sheet for Healthcare Providers: [email or web address]  This test is not yet approved or  cleared by the [place] FDA and has been authorized for detection and/or diagnosis of SARS-CoV-2 by FDA under an Emergency Use Authorization (EUA).  This EUA will remain in effect (meaning this test can be used) for the duration of the COVID-19 declaration under Section 564(b)(1) of the Act, 21 U.S.C. section 360bbb-3(b)(1), unless the authorization is terminated or revoked sooner.  Performed at [facility], [place] [address] [place], [place] [address]   Respiratory Panel by PCR     Status: None   Collection Time: 01/24/20  6:13 PM   Specimen: Nasopharyngeal Swab; Respiratory  Result Value Ref Range Status   Adenovirus NOT DETECTED NOT  DETECTED Final   Coronavirus 229E NOT DETECTED NOT DETECTED Final    Comment: (NOTE) The Coronavirus  on the Respiratory Panel, DOES NOT test for the novel  Coronavirus (2019 nCoV)    Coronavirus HKU1 NOT DETECTED NOT DETECTED Final   Coronavirus NL63 NOT DETECTED NOT DETECTED Final   Coronavirus OC43 NOT DETECTED NOT DETECTED Final   Metapneumovirus NOT DETECTED NOT DETECTED Final   Rhinovirus / Enterovirus NOT DETECTED NOT DETECTED Final   Influenza A NOT DETECTED NOT DETECTED Final   Influenza B NOT DETECTED NOT DETECTED Final   Parainfluenza Virus 1 NOT DETECTED NOT DETECTED Final   Parainfluenza Virus 2 NOT DETECTED NOT DETECTED Final   Parainfluenza Virus 3 NOT DETECTED NOT DETECTED Final   Parainfluenza Virus 4 NOT DETECTED NOT DETECTED Final   Respiratory Syncytial Virus NOT DETECTED NOT DETECTED Final   Bordetella pertussis NOT DETECTED NOT DETECTED Final   Chlamydophila pneumoniae NOT DETECTED NOT DETECTED Final   Mycoplasma pneumoniae NOT DETECTED NOT DETECTED Final    Comment: Performed at [facility], [place] [address], [place], [place] [address]  Culture, blood (routine x 2)     Status: None (Preliminary result)   Collection Time: 01/25/20  2:37 PM   Specimen: BLOOD  Result Value Ref Range Status   Specimen Description BLOOD SITE NOT SPECIFIED  Final   Special Requests   Final    BOTTLES DRAWN AEROBIC AND ANAEROBIC Blood Culture adequate volume   Culture   Final    NO GROWTH < 24 HOURS Performed at [facility], [place] [address], [place], [place] [address]    Report Status PENDING  Incomplete  Culture, blood (routine x 2)     Status: None (Preliminary result)   Collection Time: 01/25/20  2:48 PM   Specimen: BLOOD  Result Value Ref Range Status   Specimen Description BLOOD SITE NOT SPECIFIED  Final   Special Requests   Final    BOTTLES DRAWN AEROBIC AND ANAEROBIC Blood Culture adequate volume   Culture   Final    NO GROWTH < 24 HOURS Performed at [facility], [place] [address], [place],  [address]    Report Status PENDING  Incomplete     FURTHER DISCHARGE INSTRUCTIONS:  Get Medicines reviewed and adjusted: Please take all your medications with you for your next visit with your Primary MD  Laboratory/radiological data: Please request your Primary MD to go over all hospital tests and procedure/radiological results at the follow up, please ask your Primary MD to get all Hospital records sent to his/her office.  In some cases, they will be blood work, cultures and biopsy results pending at the time of your discharge. Please request that your primary care M.D. goes through all the records of your hospital data and follows up on these results.  Also Note the following: If you experience worsening of your admission symptoms, develop shortness of breath, life threatening emergency, suicidal or homicidal thoughts you must seek medical attention immediately by calling 911 or calling your MD immediately  if symptoms less severe.  You must read complete instructions/literature along with all the possible adverse reactions/side effects for all the Medicines you take and that have been prescribed to you. Take any new Medicines after you have completely understood and accpet all the possible adverse reactions/side effects.   Do not drive when taking Pain medications or sleeping medications (Benzodaizepines)  Do not take more than prescribed Pain, Sleep and Anxiety Medications.  It is not advisable to combine anxiety,sleep and pain medications without talking with your primary care practitioner  Special Instructions: If you have smoked or chewed Tobacco  in the last 2 yrs please stop smoking, stop any regular Alcohol  and or any Recreational drug use.  Wear Seat belts while driving.  Please note: You were cared for by a hospitalist during your hospital stay. Once you are discharged, your primary care physician will handle any further medical issues. Please note that NO REFILLS for any discharge medications will be authorized once you  are discharged, as it is imperative that you return to your primary care physician (or establish a relationship with a primary care physician if you do not have one) for your post hospital discharge needs so that they can reassess your need for medications and monitor your lab values.  Total Time spent coordinating discharge including counseling, education and face to face time equals35 minutes.  SignedOren Binet 01/27/2020 9:54 AM

## 2020-01-28 LAB — CULTURE, BLOOD (ROUTINE X 2)
Culture: NO GROWTH
Special Requests: ADEQUATE

## 2020-01-30 LAB — CULTURE, BLOOD (ROUTINE X 2)
Culture: NO GROWTH
Culture: NO GROWTH
Special Requests: ADEQUATE
Special Requests: ADEQUATE

## 2020-01-31 DIAGNOSIS — M545 Low back pain: Secondary | ICD-10-CM | POA: Diagnosis not present

## 2020-01-31 DIAGNOSIS — M810 Age-related osteoporosis without current pathological fracture: Secondary | ICD-10-CM | POA: Diagnosis not present

## 2020-01-31 DIAGNOSIS — D631 Anemia in chronic kidney disease: Secondary | ICD-10-CM | POA: Diagnosis not present

## 2020-01-31 DIAGNOSIS — Z8673 Personal history of transient ischemic attack (TIA), and cerebral infarction without residual deficits: Secondary | ICD-10-CM | POA: Diagnosis not present

## 2020-01-31 DIAGNOSIS — K219 Gastro-esophageal reflux disease without esophagitis: Secondary | ICD-10-CM | POA: Diagnosis not present

## 2020-01-31 DIAGNOSIS — J188 Other pneumonia, unspecified organism: Secondary | ICD-10-CM | POA: Diagnosis not present

## 2020-01-31 DIAGNOSIS — I10 Essential (primary) hypertension: Secondary | ICD-10-CM | POA: Diagnosis not present

## 2020-01-31 DIAGNOSIS — Z8781 Personal history of (healed) traumatic fracture: Secondary | ICD-10-CM | POA: Diagnosis not present

## 2020-01-31 DIAGNOSIS — I1310 Hypertensive heart and chronic kidney disease without heart failure, with stage 1 through stage 4 chronic kidney disease, or unspecified chronic kidney disease: Secondary | ICD-10-CM | POA: Diagnosis not present

## 2020-01-31 DIAGNOSIS — F419 Anxiety disorder, unspecified: Secondary | ICD-10-CM | POA: Diagnosis not present

## 2020-01-31 DIAGNOSIS — K5790 Diverticulosis of intestine, part unspecified, without perforation or abscess without bleeding: Secondary | ICD-10-CM | POA: Diagnosis not present

## 2020-01-31 DIAGNOSIS — H409 Unspecified glaucoma: Secondary | ICD-10-CM | POA: Diagnosis not present

## 2020-01-31 DIAGNOSIS — G8929 Other chronic pain: Secondary | ICD-10-CM | POA: Diagnosis not present

## 2020-01-31 DIAGNOSIS — K449 Diaphragmatic hernia without obstruction or gangrene: Secondary | ICD-10-CM | POA: Diagnosis not present

## 2020-01-31 DIAGNOSIS — F329 Major depressive disorder, single episode, unspecified: Secondary | ICD-10-CM | POA: Diagnosis not present

## 2020-01-31 DIAGNOSIS — J9601 Acute respiratory failure with hypoxia: Secondary | ICD-10-CM | POA: Diagnosis not present

## 2020-01-31 DIAGNOSIS — Z96641 Presence of right artificial hip joint: Secondary | ICD-10-CM | POA: Diagnosis not present

## 2020-01-31 DIAGNOSIS — H539 Unspecified visual disturbance: Secondary | ICD-10-CM | POA: Diagnosis not present

## 2020-01-31 DIAGNOSIS — N1831 Chronic kidney disease, stage 3a: Secondary | ICD-10-CM | POA: Diagnosis not present

## 2020-02-06 DIAGNOSIS — G8929 Other chronic pain: Secondary | ICD-10-CM | POA: Diagnosis not present

## 2020-02-06 DIAGNOSIS — Z23 Encounter for immunization: Secondary | ICD-10-CM | POA: Diagnosis not present

## 2020-02-06 DIAGNOSIS — Z09 Encounter for follow-up examination after completed treatment for conditions other than malignant neoplasm: Secondary | ICD-10-CM | POA: Diagnosis not present

## 2020-02-06 DIAGNOSIS — N183 Chronic kidney disease, stage 3 unspecified: Secondary | ICD-10-CM | POA: Diagnosis not present

## 2020-02-06 DIAGNOSIS — Z79899 Other long term (current) drug therapy: Secondary | ICD-10-CM | POA: Diagnosis not present

## 2020-02-09 DIAGNOSIS — J9601 Acute respiratory failure with hypoxia: Secondary | ICD-10-CM | POA: Diagnosis not present

## 2020-02-09 DIAGNOSIS — J188 Other pneumonia, unspecified organism: Secondary | ICD-10-CM | POA: Diagnosis not present

## 2020-02-09 DIAGNOSIS — I1310 Hypertensive heart and chronic kidney disease without heart failure, with stage 1 through stage 4 chronic kidney disease, or unspecified chronic kidney disease: Secondary | ICD-10-CM | POA: Diagnosis not present

## 2020-02-09 DIAGNOSIS — D631 Anemia in chronic kidney disease: Secondary | ICD-10-CM | POA: Diagnosis not present

## 2020-02-09 DIAGNOSIS — H409 Unspecified glaucoma: Secondary | ICD-10-CM | POA: Diagnosis not present

## 2020-02-09 DIAGNOSIS — N1831 Chronic kidney disease, stage 3a: Secondary | ICD-10-CM | POA: Diagnosis not present

## 2020-02-11 DIAGNOSIS — D631 Anemia in chronic kidney disease: Secondary | ICD-10-CM | POA: Diagnosis not present

## 2020-02-11 DIAGNOSIS — J9601 Acute respiratory failure with hypoxia: Secondary | ICD-10-CM | POA: Diagnosis not present

## 2020-02-11 DIAGNOSIS — H409 Unspecified glaucoma: Secondary | ICD-10-CM | POA: Diagnosis not present

## 2020-02-11 DIAGNOSIS — I1310 Hypertensive heart and chronic kidney disease without heart failure, with stage 1 through stage 4 chronic kidney disease, or unspecified chronic kidney disease: Secondary | ICD-10-CM | POA: Diagnosis not present

## 2020-02-11 DIAGNOSIS — N1831 Chronic kidney disease, stage 3a: Secondary | ICD-10-CM | POA: Diagnosis not present

## 2020-02-11 DIAGNOSIS — J188 Other pneumonia, unspecified organism: Secondary | ICD-10-CM | POA: Diagnosis not present

## 2020-02-13 DIAGNOSIS — J9601 Acute respiratory failure with hypoxia: Secondary | ICD-10-CM | POA: Diagnosis not present

## 2020-02-13 DIAGNOSIS — I1310 Hypertensive heart and chronic kidney disease without heart failure, with stage 1 through stage 4 chronic kidney disease, or unspecified chronic kidney disease: Secondary | ICD-10-CM | POA: Diagnosis not present

## 2020-02-13 DIAGNOSIS — N1831 Chronic kidney disease, stage 3a: Secondary | ICD-10-CM | POA: Diagnosis not present

## 2020-02-13 DIAGNOSIS — D631 Anemia in chronic kidney disease: Secondary | ICD-10-CM | POA: Diagnosis not present

## 2020-02-13 DIAGNOSIS — H409 Unspecified glaucoma: Secondary | ICD-10-CM | POA: Diagnosis not present

## 2020-02-13 DIAGNOSIS — J188 Other pneumonia, unspecified organism: Secondary | ICD-10-CM | POA: Diagnosis not present

## 2020-02-15 DIAGNOSIS — J188 Other pneumonia, unspecified organism: Secondary | ICD-10-CM | POA: Diagnosis not present

## 2020-02-15 DIAGNOSIS — D631 Anemia in chronic kidney disease: Secondary | ICD-10-CM | POA: Diagnosis not present

## 2020-02-15 DIAGNOSIS — N1831 Chronic kidney disease, stage 3a: Secondary | ICD-10-CM | POA: Diagnosis not present

## 2020-02-15 DIAGNOSIS — I1310 Hypertensive heart and chronic kidney disease without heart failure, with stage 1 through stage 4 chronic kidney disease, or unspecified chronic kidney disease: Secondary | ICD-10-CM | POA: Diagnosis not present

## 2020-02-15 DIAGNOSIS — H409 Unspecified glaucoma: Secondary | ICD-10-CM | POA: Diagnosis not present

## 2020-02-15 DIAGNOSIS — J9601 Acute respiratory failure with hypoxia: Secondary | ICD-10-CM | POA: Diagnosis not present

## 2020-02-16 ENCOUNTER — Emergency Department (HOSPITAL_COMMUNITY)
Admission: EM | Admit: 2020-02-16 | Discharge: 2020-02-16 | Disposition: A | Discharge status: Home / Self Care | Payer: Medicare Other | Attending: Emergency Medicine | Admitting: Emergency Medicine

## 2020-02-16 ENCOUNTER — Other Ambulatory Visit: Payer: Self-pay

## 2020-02-16 ENCOUNTER — Encounter (HOSPITAL_COMMUNITY): Payer: Self-pay | Admitting: Emergency Medicine

## 2020-02-16 DIAGNOSIS — I10 Essential (primary) hypertension: Secondary | ICD-10-CM | POA: Diagnosis not present

## 2020-02-16 DIAGNOSIS — W228XXA Striking against or struck by other objects, initial encounter: Secondary | ICD-10-CM | POA: Diagnosis not present

## 2020-02-16 DIAGNOSIS — S79911A Unspecified injury of right hip, initial encounter: Secondary | ICD-10-CM | POA: Diagnosis present

## 2020-02-16 DIAGNOSIS — S7001XA Contusion of right hip, initial encounter: Secondary | ICD-10-CM | POA: Diagnosis not present

## 2020-02-16 DIAGNOSIS — M25572 Pain in left ankle and joints of left foot: Secondary | ICD-10-CM | POA: Diagnosis not present

## 2020-02-16 DIAGNOSIS — Z87891 Personal history of nicotine dependence: Secondary | ICD-10-CM | POA: Insufficient documentation

## 2020-02-16 DIAGNOSIS — M25551 Pain in right hip: Secondary | ICD-10-CM | POA: Diagnosis not present

## 2020-02-16 DIAGNOSIS — Y9301 Activity, walking, marching and hiking: Secondary | ICD-10-CM | POA: Diagnosis not present

## 2020-02-16 DIAGNOSIS — M2550 Pain in unspecified joint: Secondary | ICD-10-CM | POA: Diagnosis not present

## 2020-02-16 DIAGNOSIS — R52 Pain, unspecified: Secondary | ICD-10-CM | POA: Diagnosis not present

## 2020-02-16 DIAGNOSIS — F29 Unspecified psychosis not due to a substance or known physiological condition: Secondary | ICD-10-CM | POA: Diagnosis not present

## 2020-02-16 DIAGNOSIS — Z7401 Bed confinement status: Secondary | ICD-10-CM | POA: Diagnosis not present

## 2020-02-16 DIAGNOSIS — R5381 Other malaise: Secondary | ICD-10-CM | POA: Diagnosis not present

## 2020-02-16 NOTE — ED Notes (Signed)
[facility] contacted for transport 

## 2020-02-16 NOTE — Discharge Instructions (Addendum)
There are no signs of serious injury to your hip from the fall.  Take Tylenol as needed for pain.  See your doctor for problems.

## 2020-02-16 NOTE — ED Triage Notes (Signed)
Arrives via EMS from [facility] assisted living, around 4:30 pm pt stated she was ambulating with her walker, it tilted over and she hit herself/ supported herself with the wall, she had R hip sx about 3 years ago, but has generalized pain since and now the hip is hurting her again. EMS did not visualize any shortening, rotation or obvious injuries.

## 2020-02-16 NOTE — ED Provider Notes (Signed)
[facility] DEPT Provider Note   CSN: [id number] Arrival date & time: 02/16/20  1753     History No chief complaint on file.   Katrina Burnett is a 86 y.o. female.  HPI She was at home today when she was walking without her walker, and fell against the wall, catching herself with her arms.  She developed pain in her right hip after the fall, and was worried so she called EMS.  She states she has had multiple falls because of gait disorder in the last few months.  She denies injuring her head, neck or back.  She lives independently in a retirement home community.  She denies recent illnesses.  There are no other known modifying factors.    Past Medical History:  Diagnosis Date  . Allergic rhinitis   . Anxiety   . Arthritis    "hands" (11/25/2016)  . Barrett's esophagus 06/1997  . CAP (community acquired pneumonia) 11/25/2016   "this is my 3rd time having pneumonia" (11/25/2016)  . Chondromalacia   . Chronic lower back pain   . Depression   . Diverticulosis   . GERD (gastroesophageal reflux disease)   . Glaucoma   . History of hiatal hernia   . History of stomach ulcers   . Lumbar compression fracture (HCC)    L1 and L2  . Osteoporosis   . Pneumonia   . Polymyalgia ([place])   . Stroke ([place])   . Tubular adenoma of colon 06/1999  . Vaginitis   . Varicose veins     Patient Active Problem List   Diagnosis Date Noted  . Acute respiratory failure with hypoxia ([place]) 01/24/2020  . Acute hypoxemic respiratory failure ([place]) 01/23/2020  . Depression   . Thrombocytosis   . GIB (gastrointestinal bleeding) 11/21/2019  . Esophageal mass 11/19/2019  . Hiatal hernia   . Esophagitis   . Pelvic fracture ([place]) 11/11/2019  . Pneumothorax on left 10/24/2018  . Pneumothorax, left 10/24/2018  . Acute on chronic anemia 11/25/2016  . Hyponatremia 11/25/2016  . Fracture, intertrochanteric, right femur ([place]) 05/26/2015  . Closed fracture of femur ([place])   . Acute  blood loss anemia   . Adjustment disorder with mixed anxiety and depressed mood   . Fall   . Abnormality of gait   . Femur fracture, right, closed, initial encounter 05/23/2015  . Fracture of femoral neck, right, closed ([place]) 05/22/2015  . Femoral neck fracture, right, closed, initial encounter 05/22/2015  . Syncope 05/30/2012  . Nausea & vomiting 05/30/2012  . Chest pain 05/30/2012  . Hypotension 05/30/2012  . GERD (gastroesophageal reflux disease) 12/16/2011  . Lumbar compression fracture ([place])   . Polymyalgia ([place])     Past Surgical History:  Procedure Laterality Date  . BACK SURGERY    . BIOPSY  11/19/2019   Procedure: BIOPSY;  Surgeon: Gatha Mayer, MD;  Location: [facility];  Service: Endoscopy;;  . CATARACT EXTRACTION W/ INTRAOCULAR LENS  IMPLANT, BILATERAL Bilateral   . ESOPHAGOGASTRODUODENOSCOPY (EGD) WITH PROPOFOL N/A 11/19/2019   Procedure: ESOPHAGOGASTRODUODENOSCOPY (EGD) WITH PROPOFOL;  Surgeon: Gatha Mayer, MD;  Location: [facility];  Service: Endoscopy;  Laterality: N/A;  . FIXATION KYPHOPLASTY LUMBAR SPINE  10/12/10; 09/20/11  . FRACTURE SURGERY    . IR KYPHO THORACIC WITH BONE BIOPSY  03/24/2018  . IR VERTEBROPLASTY CERV/THOR BX INC UNI/BIL INC/INJECT/IMAGING  03/24/2018  . LUMBAR SPINE SURGERY     "did OR on 2 fractures in my lower back"  .  LUNG BIOPSY    . ORIF HIP FRACTURE Right 05/23/2015   Procedure: OPEN REDUCTION INTERNAL FIXATION HIP;  Surgeon: Frederik Pear, MD;  Location: [facility];  Service: Orthopedics;  Laterality: Right;  Marland Kitchen VARICOSE VEIN SURGERY     vein ligation and stripping "years ago"     OB History    Gravida  4   Para  4   Term  4   Preterm      AB      Living  4     SAB      TAB      Ectopic      Multiple      Live Births              Family History  Problem Relation Age of Onset  . Colon cancer Mother 24  . Congestive Heart Failure Mother   . Heart disease Father   . Alcohol abuse Father   . Heart disease  Maternal Grandmother   . Fibromyalgia Son     Social History   Tobacco Use  . Smoking status: Former Smoker    Years: 1.00    Types: Cigarettes  . Smokeless tobacco: Never Used  . Tobacco comment: At age 86 yrs old but nothing since   Vaping Use  . Vaping Use: Never used  Substance Use Topics  . Alcohol use: No  . Drug use: No    Home Medications Prior to Admission medications   Medication Sig Start Date End Date Taking? Authorizing Provider  citalopram (CELEXA) 40 MG tablet Take 40 mg by mouth daily.    [provider]  cyclobenzaprine (FLEXERIL) 10 MG tablet Take 10 mg by mouth every 8 (eight) hours as needed for muscle spasms.    [provider]  dorzolamide (TRUSOPT) 2 % ophthalmic solution Place 1 drop into both eyes 2 (two) times daily.     [provider]  ferrous sulfate 325 (65 FE) MG tablet Take 1 tablet (325 mg total) by mouth 2 (two) times daily with a meal. 01/27/20   Ghimire, Henreitta Leber, MD  gabapentin (NEURONTIN) 300 MG capsule Take 600 mg by mouth at bedtime.    [provider]  latanoprost (XALATAN) 0.005 % ophthalmic solution Place 1 drop into both eyes at bedtime.    [provider]  LORazepam (ATIVAN) 2 MG tablet Take 2 mg by mouth in the morning, at noon, and at bedtime.    [provider]  metoCLOPramide (REGLAN) 10 MG tablet Take 1 tablet (10 mg total) by mouth every 6 (six) hours as needed for nausea (headache, recommend taking with benadryl (diphenhydramine) 25mg ). Patient not taking: Reported on 01/23/2020 11/25/19   Gareth Morgan, MD  Multiple Vitamin (MULTIVITAMIN) tablet Take 1 tablet by mouth at bedtime.     [provider]  oxyCODONE-acetaminophen (PERCOCET) 10-325 MG tablet Take 1 tablet by mouth every 6 (six) hours as needed for pain.    [provider]  pantoprazole (PROTONIX) 40 MG tablet Take 1 tablet (40 mg total) by mouth daily. 11/23/19 01/23/20  Shahmehdi, Valeria Batman, MD    polyethylene glycol (MIRALAX / GLYCOLAX) 17 g packet Take 17 g by mouth daily as needed for mild constipation. Patient not taking: Reported on 01/23/2020 11/12/19   Raiford Noble Latif, DO  potassium chloride SA (KLOR-CON) 20 MEQ tablet Take 1 tablet (20 mEq total) by mouth 2 (two) times daily. Patient not taking: Reported on 01/23/2020 12/01/19   Dorie Rank,  MD  Probiotic Product (ALIGN) 4 MG CAPS Take 4 mg by mouth at bedtime.     [provider]  sucralfate (CARAFATE) 1 GM/10ML suspension Take 10 mLs (1 g total) by mouth 3 (three) times daily as needed. Patient not taking: Reported on 01/23/2020 11/23/19 12/23/19  Deatra James, MD  traZODone (DESYREL) 50 MG tablet Take 0.5-1 tablets (25-50 mg total) by mouth at bedtime as needed for sleep. Patient taking differently: Take 50 mg by mouth at bedtime.  10/28/18   [name], MD    Allergies    Avelox [moxifloxacin hcl in nacl], Biaxin [clarithromycin], Cefuroxime, Levaquin [levofloxacin in d5w], Minocycline, Paxil [paroxetine hcl], Prednisone, Remeron [mirtazapine], Sulfur, Trimox [amoxicillin], and Viibryd [vilazodone hcl]  Review of Systems   Review of Systems  All other systems reviewed and are negative.   Physical Exam Updated Vital Signs There were no vitals taken for this visit.  Physical Exam Vitals and nursing note reviewed.  Constitutional:      General: She is not in acute distress.    Appearance: She is well-developed. She is not ill-appearing, toxic-appearing or diaphoretic.  HENT:     Head: Normocephalic and atraumatic.     Right Ear: External ear normal.     Left Ear: External ear normal.     Mouth/Throat:     Mouth: Mucous membranes are moist.  Eyes:     Conjunctiva/sclera: Conjunctivae normal.     Pupils: Pupils are equal, round, and reactive to light.  Neck:     Trachea: Phonation normal.  Cardiovascular:     Rate and Rhythm: Normal rate.  Pulmonary:     Effort: Pulmonary effort is normal.   Musculoskeletal:        General: Normal range of motion.     Cervical back: Normal range of motion and neck supple.     Comments: No focal tenderness of the right hip, pelvic region, lumbar region.  Skin:    General: Skin is warm and dry.  Neurological:     Mental Status: She is alert and oriented to person, place, and time.     Cranial Nerves: No cranial nerve deficit.     Sensory: No sensory deficit.     Motor: No abnormal muscle tone.     Coordination: Coordination normal.  Psychiatric:        Mood and Affect: Mood normal.        Behavior: Behavior normal.        Thought Content: Thought content normal.        Judgment: Judgment normal.     ED Results / Procedures / Treatments   Labs (all labs ordered are listed, but only abnormal results are displayed) Labs Reviewed - No data to display  EKG None  Radiology No results found.  Procedures Procedures (including critical care time)  Medications Ordered in ED Medications - No data to display  ED Course  I have reviewed the triage vital signs and the nursing notes.  Pertinent labs & imaging results that were available during my care of the patient were reviewed by me and considered in my medical decision making (see chart for details).    MDM Rules/Calculators/A&P                           Patient Vitals for the past 24 hrs:  BP Temp Temp src Pulse Resp SpO2 Height Weight  02/16/20 1841 (!) 161/94 99.1 F (37.3 C)  Oral 79 16 100 % -- --  02/16/20 1833 -- -- -- -- -- -- 5\' 2"  (1.575 m) 46.4 kg    7:29 PM Reevaluation with update and discussion. After initial assessment and treatment, an updated evaluation reveals normal ambulation with walker.  5 discussed with the patient all questions were answered. Daleen Bo   Medical Decision Making:  This patient is presenting for evaluation of contusion right hip, which does not require a range of treatment options, and is not a complaint that involves a high risk of  morbidity and mortality. The differential diagnoses include contusion, fracture, muscle strain. I decided to review old records, and in summary elderly female, living alone, with a fall, related to gait instability.  I did not require additional historical information from anyone.   Critical Interventions-clinical evaluation, observation of ambulation trial; patient walks well.  After These Interventions, the Patient was reevaluated and was found stable for discharge.  Doubt serious injury.  CRITICAL CARE-no Performed by: Daleen Bo  Nursing Notes Reviewed/ Care Coordinated Applicable Imaging Reviewed Interpretation of Laboratory Data incorporated into ED treatment  The patient appears reasonably screened and/or stabilized for discharge and I doubt any other medical condition or other [facility] requiring further screening, evaluation, or treatment in the ED at this time prior to discharge.  Plan: Home Medications-continue usual use Tylenol if needed for pain; Home Treatments-rest, fluids; return here if the recommended treatment, does not improve the symptoms; Recommended follow up-PCP, prn     Final Clinical Impression(s) / ED Diagnoses Final diagnoses:  Contusion of right hip, initial encounter    Rx / DC Orders ED Discharge Orders    None       Daleen Bo, MD 02/16/20 1950

## 2020-02-16 NOTE — ED Notes (Signed)
Pt was ambulated in hall using a walker and returned to room. Pt tolerated very well, RN made aware.

## 2020-02-16 NOTE — ED Notes (Signed)
[facility] present for transport

## 2020-02-21 DIAGNOSIS — J188 Other pneumonia, unspecified organism: Secondary | ICD-10-CM | POA: Diagnosis not present

## 2020-02-21 DIAGNOSIS — I1310 Hypertensive heart and chronic kidney disease without heart failure, with stage 1 through stage 4 chronic kidney disease, or unspecified chronic kidney disease: Secondary | ICD-10-CM | POA: Diagnosis not present

## 2020-02-21 DIAGNOSIS — H409 Unspecified glaucoma: Secondary | ICD-10-CM | POA: Diagnosis not present

## 2020-02-21 DIAGNOSIS — N1831 Chronic kidney disease, stage 3a: Secondary | ICD-10-CM | POA: Diagnosis not present

## 2020-02-21 DIAGNOSIS — D631 Anemia in chronic kidney disease: Secondary | ICD-10-CM | POA: Diagnosis not present

## 2020-02-21 DIAGNOSIS — J9601 Acute respiratory failure with hypoxia: Secondary | ICD-10-CM | POA: Diagnosis not present

## 2020-02-25 DIAGNOSIS — D631 Anemia in chronic kidney disease: Secondary | ICD-10-CM | POA: Diagnosis not present

## 2020-02-25 DIAGNOSIS — N1831 Chronic kidney disease, stage 3a: Secondary | ICD-10-CM | POA: Diagnosis not present

## 2020-02-25 DIAGNOSIS — I1310 Hypertensive heart and chronic kidney disease without heart failure, with stage 1 through stage 4 chronic kidney disease, or unspecified chronic kidney disease: Secondary | ICD-10-CM | POA: Diagnosis not present

## 2020-02-25 DIAGNOSIS — J188 Other pneumonia, unspecified organism: Secondary | ICD-10-CM | POA: Diagnosis not present

## 2020-02-25 DIAGNOSIS — J9601 Acute respiratory failure with hypoxia: Secondary | ICD-10-CM | POA: Diagnosis not present

## 2020-02-25 DIAGNOSIS — H409 Unspecified glaucoma: Secondary | ICD-10-CM | POA: Diagnosis not present

## 2020-02-26 DIAGNOSIS — M545 Low back pain, unspecified: Secondary | ICD-10-CM | POA: Diagnosis not present

## 2020-02-28 DIAGNOSIS — H409 Unspecified glaucoma: Secondary | ICD-10-CM | POA: Diagnosis not present

## 2020-02-28 DIAGNOSIS — J9601 Acute respiratory failure with hypoxia: Secondary | ICD-10-CM | POA: Diagnosis not present

## 2020-02-28 DIAGNOSIS — J188 Other pneumonia, unspecified organism: Secondary | ICD-10-CM | POA: Diagnosis not present

## 2020-02-28 DIAGNOSIS — I1310 Hypertensive heart and chronic kidney disease without heart failure, with stage 1 through stage 4 chronic kidney disease, or unspecified chronic kidney disease: Secondary | ICD-10-CM | POA: Diagnosis not present

## 2020-02-28 DIAGNOSIS — N1831 Chronic kidney disease, stage 3a: Secondary | ICD-10-CM | POA: Diagnosis not present

## 2020-02-28 DIAGNOSIS — D631 Anemia in chronic kidney disease: Secondary | ICD-10-CM | POA: Diagnosis not present

## 2020-02-29 DIAGNOSIS — N1831 Chronic kidney disease, stage 3a: Secondary | ICD-10-CM | POA: Diagnosis not present

## 2020-02-29 DIAGNOSIS — J9601 Acute respiratory failure with hypoxia: Secondary | ICD-10-CM | POA: Diagnosis not present

## 2020-02-29 DIAGNOSIS — J188 Other pneumonia, unspecified organism: Secondary | ICD-10-CM | POA: Diagnosis not present

## 2020-02-29 DIAGNOSIS — H409 Unspecified glaucoma: Secondary | ICD-10-CM | POA: Diagnosis not present

## 2020-02-29 DIAGNOSIS — I1310 Hypertensive heart and chronic kidney disease without heart failure, with stage 1 through stage 4 chronic kidney disease, or unspecified chronic kidney disease: Secondary | ICD-10-CM | POA: Diagnosis not present

## 2020-02-29 DIAGNOSIS — D631 Anemia in chronic kidney disease: Secondary | ICD-10-CM | POA: Diagnosis not present

## 2020-03-01 DIAGNOSIS — Z96641 Presence of right artificial hip joint: Secondary | ICD-10-CM | POA: Diagnosis not present

## 2020-03-01 DIAGNOSIS — D631 Anemia in chronic kidney disease: Secondary | ICD-10-CM | POA: Diagnosis not present

## 2020-03-01 DIAGNOSIS — Z8781 Personal history of (healed) traumatic fracture: Secondary | ICD-10-CM | POA: Diagnosis not present

## 2020-03-01 DIAGNOSIS — F419 Anxiety disorder, unspecified: Secondary | ICD-10-CM | POA: Diagnosis not present

## 2020-03-01 DIAGNOSIS — K5790 Diverticulosis of intestine, part unspecified, without perforation or abscess without bleeding: Secondary | ICD-10-CM | POA: Diagnosis not present

## 2020-03-01 DIAGNOSIS — H409 Unspecified glaucoma: Secondary | ICD-10-CM | POA: Diagnosis not present

## 2020-03-01 DIAGNOSIS — I10 Essential (primary) hypertension: Secondary | ICD-10-CM | POA: Diagnosis not present

## 2020-03-01 DIAGNOSIS — J188 Other pneumonia, unspecified organism: Secondary | ICD-10-CM | POA: Diagnosis not present

## 2020-03-01 DIAGNOSIS — Z8673 Personal history of transient ischemic attack (TIA), and cerebral infarction without residual deficits: Secondary | ICD-10-CM | POA: Diagnosis not present

## 2020-03-01 DIAGNOSIS — I1310 Hypertensive heart and chronic kidney disease without heart failure, with stage 1 through stage 4 chronic kidney disease, or unspecified chronic kidney disease: Secondary | ICD-10-CM | POA: Diagnosis not present

## 2020-03-01 DIAGNOSIS — M545 Low back pain, unspecified: Secondary | ICD-10-CM | POA: Diagnosis not present

## 2020-03-01 DIAGNOSIS — G8929 Other chronic pain: Secondary | ICD-10-CM | POA: Diagnosis not present

## 2020-03-01 DIAGNOSIS — N1831 Chronic kidney disease, stage 3a: Secondary | ICD-10-CM | POA: Diagnosis not present

## 2020-03-01 DIAGNOSIS — M810 Age-related osteoporosis without current pathological fracture: Secondary | ICD-10-CM | POA: Diagnosis not present

## 2020-03-01 DIAGNOSIS — K449 Diaphragmatic hernia without obstruction or gangrene: Secondary | ICD-10-CM | POA: Diagnosis not present

## 2020-03-01 DIAGNOSIS — F329 Major depressive disorder, single episode, unspecified: Secondary | ICD-10-CM | POA: Diagnosis not present

## 2020-03-01 DIAGNOSIS — K219 Gastro-esophageal reflux disease without esophagitis: Secondary | ICD-10-CM | POA: Diagnosis not present

## 2020-03-01 DIAGNOSIS — H539 Unspecified visual disturbance: Secondary | ICD-10-CM | POA: Diagnosis not present

## 2020-03-01 DIAGNOSIS — J9601 Acute respiratory failure with hypoxia: Secondary | ICD-10-CM | POA: Diagnosis not present

## 2020-03-03 DIAGNOSIS — I1310 Hypertensive heart and chronic kidney disease without heart failure, with stage 1 through stage 4 chronic kidney disease, or unspecified chronic kidney disease: Secondary | ICD-10-CM | POA: Diagnosis not present

## 2020-03-03 DIAGNOSIS — J188 Other pneumonia, unspecified organism: Secondary | ICD-10-CM | POA: Diagnosis not present

## 2020-03-03 DIAGNOSIS — H409 Unspecified glaucoma: Secondary | ICD-10-CM | POA: Diagnosis not present

## 2020-03-03 DIAGNOSIS — D631 Anemia in chronic kidney disease: Secondary | ICD-10-CM | POA: Diagnosis not present

## 2020-03-03 DIAGNOSIS — J9601 Acute respiratory failure with hypoxia: Secondary | ICD-10-CM | POA: Diagnosis not present

## 2020-03-03 DIAGNOSIS — N1831 Chronic kidney disease, stage 3a: Secondary | ICD-10-CM | POA: Diagnosis not present

## 2020-03-04 DIAGNOSIS — M25551 Pain in right hip: Secondary | ICD-10-CM | POA: Diagnosis not present

## 2020-03-04 DIAGNOSIS — M545 Low back pain, unspecified: Secondary | ICD-10-CM | POA: Diagnosis not present

## 2020-03-04 DIAGNOSIS — M5441 Lumbago with sciatica, right side: Secondary | ICD-10-CM | POA: Diagnosis not present

## 2020-03-09 DIAGNOSIS — N1831 Chronic kidney disease, stage 3a: Secondary | ICD-10-CM | POA: Diagnosis not present

## 2020-03-09 DIAGNOSIS — I1310 Hypertensive heart and chronic kidney disease without heart failure, with stage 1 through stage 4 chronic kidney disease, or unspecified chronic kidney disease: Secondary | ICD-10-CM | POA: Diagnosis not present

## 2020-03-09 DIAGNOSIS — H409 Unspecified glaucoma: Secondary | ICD-10-CM | POA: Diagnosis not present

## 2020-03-09 DIAGNOSIS — J9601 Acute respiratory failure with hypoxia: Secondary | ICD-10-CM | POA: Diagnosis not present

## 2020-03-09 DIAGNOSIS — J188 Other pneumonia, unspecified organism: Secondary | ICD-10-CM | POA: Diagnosis not present

## 2020-03-09 DIAGNOSIS — D631 Anemia in chronic kidney disease: Secondary | ICD-10-CM | POA: Diagnosis not present

## 2020-03-19 ENCOUNTER — Other Ambulatory Visit: Payer: Self-pay

## 2020-03-19 ENCOUNTER — Encounter (HOSPITAL_COMMUNITY): Payer: Self-pay | Admitting: Student

## 2020-03-19 ENCOUNTER — Emergency Department (HOSPITAL_COMMUNITY)
Admission: EM | Admit: 2020-03-19 | Discharge: 2020-03-19 | Disposition: A | Discharge status: Home / Self Care | Payer: Medicare Other | Attending: Emergency Medicine | Admitting: Emergency Medicine

## 2020-03-19 ENCOUNTER — Emergency Department (HOSPITAL_COMMUNITY): Payer: Medicare Other

## 2020-03-19 DIAGNOSIS — S76011A Strain of muscle, fascia and tendon of right hip, initial encounter: Secondary | ICD-10-CM | POA: Diagnosis not present

## 2020-03-19 DIAGNOSIS — I959 Hypotension, unspecified: Secondary | ICD-10-CM | POA: Diagnosis not present

## 2020-03-19 DIAGNOSIS — Z87891 Personal history of nicotine dependence: Secondary | ICD-10-CM | POA: Insufficient documentation

## 2020-03-19 DIAGNOSIS — J90 Pleural effusion, not elsewhere classified: Secondary | ICD-10-CM | POA: Diagnosis not present

## 2020-03-19 DIAGNOSIS — R55 Syncope and collapse: Secondary | ICD-10-CM | POA: Diagnosis not present

## 2020-03-19 DIAGNOSIS — Y9289 Other specified places as the place of occurrence of the external cause: Secondary | ICD-10-CM | POA: Insufficient documentation

## 2020-03-19 DIAGNOSIS — S32501D Unspecified fracture of right pubis, subsequent encounter for fracture with routine healing: Secondary | ICD-10-CM | POA: Diagnosis not present

## 2020-03-19 DIAGNOSIS — S32491A Other specified fracture of right acetabulum, initial encounter for closed fracture: Secondary | ICD-10-CM | POA: Diagnosis not present

## 2020-03-19 DIAGNOSIS — S72101A Unspecified trochanteric fracture of right femur, initial encounter for closed fracture: Secondary | ICD-10-CM | POA: Diagnosis not present

## 2020-03-19 DIAGNOSIS — R0902 Hypoxemia: Secondary | ICD-10-CM | POA: Diagnosis not present

## 2020-03-19 DIAGNOSIS — M79604 Pain in right leg: Secondary | ICD-10-CM | POA: Diagnosis not present

## 2020-03-19 DIAGNOSIS — S79911A Unspecified injury of right hip, initial encounter: Secondary | ICD-10-CM | POA: Diagnosis present

## 2020-03-19 DIAGNOSIS — M2550 Pain in unspecified joint: Secondary | ICD-10-CM | POA: Diagnosis not present

## 2020-03-19 DIAGNOSIS — W06XXXA Fall from bed, initial encounter: Secondary | ICD-10-CM | POA: Diagnosis not present

## 2020-03-19 DIAGNOSIS — R52 Pain, unspecified: Secondary | ICD-10-CM | POA: Diagnosis not present

## 2020-03-19 DIAGNOSIS — S8991XA Unspecified injury of right lower leg, initial encounter: Secondary | ICD-10-CM | POA: Diagnosis not present

## 2020-03-19 DIAGNOSIS — Z7401 Bed confinement status: Secondary | ICD-10-CM | POA: Diagnosis not present

## 2020-03-19 DIAGNOSIS — I10 Essential (primary) hypertension: Secondary | ICD-10-CM | POA: Diagnosis not present

## 2020-03-19 LAB — CBC WITH DIFFERENTIAL/PLATELET
Abs Immature Granulocytes: 0.02 10*3/uL (ref 0.00–0.07)
Basophils Absolute: 0.1 10*3/uL (ref 0.0–0.1)
Basophils Relative: 1 %
Eosinophils Absolute: 0.1 10*3/uL (ref 0.0–0.5)
Eosinophils Relative: 1 %
HCT: 41.1 % (ref 36.0–46.0)
Hemoglobin: 12.2 g/dL (ref 12.0–15.0)
Immature Granulocytes: 0 %
Lymphocytes Relative: 11 %
Lymphs Abs: 1.2 10*3/uL (ref 0.7–4.0)
MCH: 25.1 pg — ABNORMAL LOW (ref 26.0–34.0)
MCHC: 29.7 g/dL — ABNORMAL LOW (ref 30.0–36.0)
MCV: 84.4 fL (ref 80.0–100.0)
Monocytes Absolute: 0.7 10*3/uL (ref 0.1–1.0)
Monocytes Relative: 6 %
Neutro Abs: 9.1 10*3/uL — ABNORMAL HIGH (ref 1.7–7.7)
Neutrophils Relative %: 81 %
Platelets: 346 10*3/uL (ref 150–400)
RBC: 4.87 MIL/uL (ref 3.87–5.11)
RDW: 21.6 % — ABNORMAL HIGH (ref 11.5–15.5)
WBC: 11.2 10*3/uL — ABNORMAL HIGH (ref 4.0–10.5)
nRBC: 0 % (ref 0.0–0.2)

## 2020-03-19 LAB — BASIC METABOLIC PANEL
Anion gap: 10 (ref 5–15)
BUN: 17 mg/dL (ref 8–23)
CO2: 26 mmol/L (ref 22–32)
Calcium: 9.6 mg/dL (ref 8.9–10.3)
Chloride: 102 mmol/L (ref 98–111)
Creatinine, Ser: 1.07 mg/dL — ABNORMAL HIGH (ref 0.44–1.00)
GFR, Estimated: 48 mL/min — ABNORMAL LOW (ref >60)
Glucose, Bld: 94 mg/dL (ref 70–99)
Potassium: 4.6 mmol/L (ref 3.5–5.1)
Sodium: 138 mmol/L (ref 135–145)

## 2020-03-19 MED ORDER — OXYCODONE-ACETAMINOPHEN 5-325 MG PO TABS
1.0000 | ORAL_TABLET | Freq: Once | ORAL | Status: AC
  Administered 2020-03-19: 1 via ORAL
  Filled 2020-03-19: qty 1

## 2020-03-19 NOTE — Discharge Instructions (Addendum)
Please follow-up with your primary doctor regarding your fall.  Return to ER if he had any additional falls, issues with walking, chest pain, difficulty breathing or episodes of passing out.

## 2020-03-19 NOTE — ED Provider Notes (Signed)
[facility] DEPT Provider Note   CSN: [id number] Arrival date & time: 03/19/20  1105     History Chief Complaint  Patient presents with  . Leg Pain  . Fall    Katrina Burnett is a 86 y.o. female.  Presents to ER after fall.  Reports that this morning she fell in her room, unsure exactly how she fell, this is while she was getting dressed next to her bed in your closet.  Landed on her right hip and has been having some hip pain since the fall.  Denies any other symptoms, does not believe she passed out.  Lives in independent living.  Per chart review notable R hip hx - Patient is status post DHS screw for an intertrochanteric hip fracture about 3 years ago.  Surgery performed by Dr. Mayer Camel.  In July, she was found to have nondisplaced right acetabular pubic rami fracture.  She was evaluated by Dr. Sharol Given at the time who recommended progressive ambulation weightbearing as tolerated.  Daughter reports patient has follow-up with orthopedics this week.  HPI     Past Medical History:  Diagnosis Date  . Allergic rhinitis   . Anxiety   . Arthritis    "hands" (11/25/2016)  . Barrett's esophagus 06/1997  . CAP (community acquired pneumonia) 11/25/2016   "this is my 3rd time having pneumonia" (11/25/2016)  . Chondromalacia   . Chronic lower back pain   . Depression   . Diverticulosis   . GERD (gastroesophageal reflux disease)   . Glaucoma   . History of hiatal hernia   . History of stomach ulcers   . Lumbar compression fracture (HCC)    L1 and L2  . Osteoporosis   . Pneumonia   . Polymyalgia ([place])   . Stroke ([place])   . Tubular adenoma of colon 06/1999  . Vaginitis   . Varicose veins     Patient Active Problem List   Diagnosis Date Noted  . Acute respiratory failure with hypoxia ([place]) 01/24/2020  . Acute hypoxemic respiratory failure ([place]) 01/23/2020  . Depression   . Thrombocytosis   . GIB (gastrointestinal bleeding) 11/21/2019  . Esophageal mass  11/19/2019  . Hiatal hernia   . Esophagitis   . Pelvic fracture ([place]) 11/11/2019  . Pneumothorax on left 10/24/2018  . Pneumothorax, left 10/24/2018  . Acute on chronic anemia 11/25/2016  . Hyponatremia 11/25/2016  . Fracture, intertrochanteric, right femur ([place]) 05/26/2015  . Closed fracture of femur ([place])   . Acute blood loss anemia   . Adjustment disorder with mixed anxiety and depressed mood   . Fall   . Abnormality of gait   . Femur fracture, right, closed, initial encounter 05/23/2015  . Fracture of femoral neck, right, closed ([place]) 05/22/2015  . Femoral neck fracture, right, closed, initial encounter 05/22/2015  . Syncope 05/30/2012  . Nausea & vomiting 05/30/2012  . Chest pain 05/30/2012  . Hypotension 05/30/2012  . GERD (gastroesophageal reflux disease) 12/16/2011  . Lumbar compression fracture ([place])   . Polymyalgia ([place])     Past Surgical History:  Procedure Laterality Date  . BACK SURGERY    . BIOPSY  11/19/2019   Procedure: BIOPSY;  Surgeon: Gatha Mayer, MD;  Location: [facility];  Service: Endoscopy;;  . CATARACT EXTRACTION W/ INTRAOCULAR LENS  IMPLANT, BILATERAL Bilateral   . ESOPHAGOGASTRODUODENOSCOPY (EGD) WITH PROPOFOL N/A 11/19/2019   Procedure: ESOPHAGOGASTRODUODENOSCOPY (EGD) WITH PROPOFOL;  Surgeon: Gatha Mayer, MD;  Location: [facility];  Service:  Endoscopy;  Laterality: N/A;  . FIXATION KYPHOPLASTY LUMBAR SPINE  10/12/10; 09/20/11  . FRACTURE SURGERY    . IR KYPHO THORACIC WITH BONE BIOPSY  03/24/2018  . IR VERTEBROPLASTY CERV/THOR BX INC UNI/BIL INC/INJECT/IMAGING  03/24/2018  . LUMBAR SPINE SURGERY     "did OR on 2 fractures in my lower back"  . LUNG BIOPSY    . ORIF HIP FRACTURE Right 05/23/2015   Procedure: OPEN REDUCTION INTERNAL FIXATION HIP;  Surgeon: Frederik Pear, MD;  Location: [facility];  Service: Orthopedics;  Laterality: Right;  Marland Kitchen VARICOSE VEIN SURGERY     vein ligation and stripping "years ago"     OB History    Gravida  4   Para    4   Term  4   Preterm      AB      Living  4     SAB      TAB      Ectopic      Multiple      Live Births              Family History  Problem Relation Age of Onset  . Colon cancer Mother 77  . Congestive Heart Failure Mother   . Heart disease Father   . Alcohol abuse Father   . Heart disease Maternal Grandmother   . Fibromyalgia Son     Social History   Tobacco Use  . Smoking status: Former Smoker    Years: 1.00    Types: Cigarettes  . Smokeless tobacco: Never Used  . Tobacco comment: At age 38 yrs old but nothing since   Vaping Use  . Vaping Use: Never used  Substance Use Topics  . Alcohol use: No  . Drug use: No    Home Medications Prior to Admission medications   Medication Sig Start Date End Date Taking? Authorizing Provider  citalopram (CELEXA) 40 MG tablet Take 60 mg by mouth daily.    Yes [provider]  cyclobenzaprine (FLEXERIL) 10 MG tablet Take 10 mg by mouth every 8 (eight) hours as needed for muscle spasms.   Yes [provider]  dorzolamide (TRUSOPT) 2 % ophthalmic solution Place 1 drop into both eyes 2 (two) times daily.    Yes [provider]  ferrous sulfate 325 (65 FE) MG tablet Take 1 tablet (325 mg total) by mouth 2 (two) times daily with a meal. 01/27/20  Yes Ghimire, Henreitta Leber, MD  gabapentin (NEURONTIN) 300 MG capsule Take 600 mg by mouth at bedtime.   Yes [provider]  latanoprost (XALATAN) 0.005 % ophthalmic solution Place 1 drop into both eyes at bedtime.   Yes [provider]  LORazepam (ATIVAN) 2 MG tablet Take 2 mg by mouth in the morning and at bedtime.    Yes [provider]  Multiple Vitamin (MULTIVITAMIN) tablet Take 1 tablet by mouth at bedtime.    Yes [provider]  oxyCODONE-acetaminophen (PERCOCET) 10-325 MG tablet Take 1 tablet by mouth every 6 (six) hours as needed for pain.   Yes [provider]  pantoprazole (PROTONIX) 40 MG tablet Take  1 tablet (40 mg total) by mouth daily. 11/23/19 03/19/20 Yes Shahmehdi, Valeria Batman, MD  Probiotic Product (ALIGN) 4 MG CAPS Take 4 mg by mouth at bedtime.    Yes [provider]  traZODone (DESYREL) 50 MG tablet Take 0.5-1 tablets (25-50 mg total) by mouth at bedtime as needed for sleep. Patient taking differently: Take  50 mg by mouth at bedtime.  10/28/18  Yes [name], MD  metoCLOPramide (REGLAN) 10 MG tablet Take 1 tablet (10 mg total) by mouth every 6 (six) hours as needed for nausea (headache, recommend taking with benadryl (diphenhydramine) 25mg ). Patient not taking: Reported on 03/19/2020 11/25/19   Gareth Morgan, MD  polyethylene glycol (MIRALAX / GLYCOLAX) 17 g packet Take 17 g by mouth daily as needed for mild constipation. Patient not taking: Reported on 01/23/2020 11/12/19   Raiford Noble Latif, DO  potassium chloride SA (KLOR-CON) 20 MEQ tablet Take 1 tablet (20 mEq total) by mouth 2 (two) times daily. Patient not taking: Reported on 01/23/2020 12/01/19   Dorie Rank, MD  sucralfate (CARAFATE) 1 GM/10ML suspension Take 10 mLs (1 g total) by mouth 3 (three) times daily as needed. Patient not taking: Reported on 01/23/2020 11/23/19 12/23/19  Deatra James, MD    Allergies    Avelox [moxifloxacin hcl in nacl], Biaxin [clarithromycin], Cefuroxime, Levaquin [levofloxacin in d5w], Minocycline, Paxil [paroxetine hcl], Prednisone, Remeron [mirtazapine], Sulfur, Trimox [amoxicillin], Viibryd [vilazodone hcl], and Penicillins  Review of Systems   Review of Systems  Constitutional: Negative for chills and fever.  HENT: Negative for ear pain and sore throat.   Eyes: Negative for pain and visual disturbance.  Respiratory: Negative for cough and shortness of breath.   Cardiovascular: Negative for chest pain and palpitations.  Gastrointestinal: Negative for abdominal pain and vomiting.  Genitourinary: Negative for dysuria and hematuria.  Musculoskeletal: Positive for arthralgias.  Negative for back pain.  Skin: Negative for color change and rash.  Neurological: Negative for seizures and syncope.  All other systems reviewed and are negative.   Physical Exam Updated Vital Signs BP (!) 165/84   Pulse 78   Temp 98.5 F (36.9 C) (Oral)   Resp 19   Ht 5\' 2"  (1.575 m)   Wt 46 kg   SpO2 94%   BMI 18.55 kg/m   Physical Exam Vitals and nursing note reviewed.  Constitutional:      General: She is not in acute distress.    Appearance: She is well-developed.  HENT:     Head: Normocephalic and atraumatic.  Eyes:     Conjunctiva/sclera: Conjunctivae normal.  Cardiovascular:     Rate and Rhythm: Normal rate and regular rhythm.     Heart sounds: No murmur heard.   Pulmonary:     Effort: Pulmonary effort is normal. No respiratory distress.     Breath sounds: Normal breath sounds.  Abdominal:     Palpations: Abdomen is soft.     Tenderness: There is no abdominal tenderness.  Musculoskeletal:     Cervical back: Neck supple.     Comments: Back: no C, T, L spine TTP, no step off or deformity RUE: no TTP throughout, no deformity, normal joint ROM, radial pulse intact, distal sensation and motor intact LUE: no TTP throughout, no deformity, normal joint ROM, radial pulse intact, distal sensation and motor intact RLE:  Some TTP over right hip, there is  no deformity, normal joint ROM, distal pulse, sensation and motor intact LLE: no TTP throughout, no deformity, normal joint ROM, distal pulse, sensation and motor intact  Skin:    General: Skin is warm and dry.  Neurological:     General: No focal deficit present.     Mental Status: She is alert.  Psychiatric:        Mood and Affect: Mood normal.        Behavior: Behavior  normal.     ED Results / Procedures / Treatments   Labs (all labs ordered are listed, but only abnormal results are displayed) Labs Reviewed  CBC WITH DIFFERENTIAL/PLATELET - Abnormal; Notable for the following components:      Result Value     WBC 11.2 (*)    MCH 25.1 (*)    MCHC 29.7 (*)    RDW 21.6 (*)    Neutro Abs 9.1 (*)    All other components within normal limits  BASIC METABOLIC PANEL - Abnormal; Notable for the following components:   Creatinine, Ser 1.07 (*)    GFR, Estimated 48 (*)    All other components within normal limits    EKG None  Radiology DG Chest 1 View  Result Date: 03/19/2020 CLINICAL DATA:  Fall.  Near syncope. EXAM: CHEST  1 VIEW COMPARISON:  None. FINDINGS: Similar cardiomediastinal silhouette. No focal consolidation. No visible pleural effusions or pneumothorax. Vertebroplasty at multiple levels. Vertebral bodies are not well evaluated on the single AP radiograph. No evidence of acute osseous abnormality. IMPRESSION: No acute cardiopulmonary disease. Electronically Signed   By: Margaretha Sheffield MD   On: 03/19/2020 12:51   DG Hip Unilat W or Wo Pelvis 2-3 Views Right  Result Date: 03/19/2020 CLINICAL DATA:  Near syncopal episode with fall and right hip pain. EXAM: DG HIP (WITH OR WITHOUT PELVIS) 2-3V RIGHT COMPARISON:  11/11/2019 FINDINGS: Previous ORIF right trochanteric region fracture. Healing process in progress related to the right inferior ramus fracture. Right acetabular fracture remains visible. Cannot rule out some exacerbation related to the recent injury. No other pelvic fracture seen. IMPRESSION: Healing right inferior ramus fracture subsequent to acute injury on 11/11/2019. Right acetabular fracture remains visible. Cannot rule out some exacerbation of the acetabular fracture related to the recent injury. Electronically Signed   By: Nelson Chimes M.D.   On: 03/19/2020 12:50    Procedures Procedures (including critical care time)  Medications Ordered in ED Medications  oxyCODONE-acetaminophen (PERCOCET/ROXICET) 5-325 MG per tablet 1 tablet (1 tablet Oral Given 03/19/20 1155)    ED Course  I have reviewed the triage vital signs and the nursing notes.  Pertinent labs & imaging  results that were available during my care of the patient were reviewed by me and considered in my medical decision making (see chart for details).    MDM Rules/Calculators/A&P                         86 year old lady presents to ER after fall, hip pain.  On exam she is well-appearing, no obvious deformity was appreciated.  Noted some tenderness over her right hip.  The x-ray redemonstrated known right acetabular fracture.  Radiologist commented that they cannot rule out some exacerbation of the acetabular fracture related to recent injury.  Previously patient had been recommended nonop, WBAT.  Patient is able to bear weight, ambulate in room for baseline.  Low suspicion for occult fracture, low suspicion given exam, ambulatory and x-ray findings for any clinically significant worsening of her previously known acetabular fracture.  Recommended WBAT, cont home walker and f/u with ortho and primary.     After the discussed management above, the patient was determined to be safe for discharge.  The patient was in agreement with this plan and all questions regarding their care were answered.  ED return precautions were discussed and the patient will return to the ED with any significant worsening of condition.  Final  Clinical Impression(s) / ED Diagnoses Final diagnoses:  Hip strain, right, initial encounter    Rx / DC Orders ED Discharge Orders    None       Lucrezia Starch, MD 03/20/20 [phone]

## 2020-03-19 NOTE — ED Triage Notes (Addendum)
Patient BIB [facility] after a fall at her independent living at [facility].  Patient is c/o right leg pain. Patient was trying to get dressed and fell next to her bed.  Was able to get up to and back in bed.  Patient denies LOC.  Patient is alert and oriented X4 per EMS.  Lives alone.

## 2020-03-19 NOTE — ED Notes (Signed)
Spoke with patient's daughter [name].  She stated she is sick and everyone else is at work so patient will have to be transported home via [place].  [facility] contacted for transport.

## 2020-03-19 NOTE — ED Notes (Signed)
Patient transported to X-ray 

## 2020-03-28 DIAGNOSIS — K5790 Diverticulosis of intestine, part unspecified, without perforation or abscess without bleeding: Secondary | ICD-10-CM | POA: Diagnosis not present

## 2020-03-28 DIAGNOSIS — D631 Anemia in chronic kidney disease: Secondary | ICD-10-CM | POA: Diagnosis not present

## 2020-03-28 DIAGNOSIS — G8929 Other chronic pain: Secondary | ICD-10-CM | POA: Diagnosis not present

## 2020-03-28 DIAGNOSIS — F32A Depression, unspecified: Secondary | ICD-10-CM | POA: Diagnosis not present

## 2020-03-28 DIAGNOSIS — M545 Low back pain, unspecified: Secondary | ICD-10-CM | POA: Diagnosis not present

## 2020-03-28 DIAGNOSIS — Z8616 Personal history of COVID-19: Secondary | ICD-10-CM | POA: Diagnosis not present

## 2020-03-28 DIAGNOSIS — N183 Chronic kidney disease, stage 3 unspecified: Secondary | ICD-10-CM | POA: Diagnosis not present

## 2020-03-28 DIAGNOSIS — M810 Age-related osteoporosis without current pathological fracture: Secondary | ICD-10-CM | POA: Diagnosis not present

## 2020-03-28 DIAGNOSIS — J449 Chronic obstructive pulmonary disease, unspecified: Secondary | ICD-10-CM | POA: Diagnosis not present

## 2020-03-28 DIAGNOSIS — K219 Gastro-esophageal reflux disease without esophagitis: Secondary | ICD-10-CM | POA: Diagnosis not present

## 2020-03-28 DIAGNOSIS — J309 Allergic rhinitis, unspecified: Secondary | ICD-10-CM | POA: Diagnosis not present

## 2020-03-28 DIAGNOSIS — F419 Anxiety disorder, unspecified: Secondary | ICD-10-CM | POA: Diagnosis not present

## 2020-03-28 DIAGNOSIS — L89322 Pressure ulcer of left buttock, stage 2: Secondary | ICD-10-CM | POA: Diagnosis not present

## 2020-03-28 DIAGNOSIS — M25551 Pain in right hip: Secondary | ICD-10-CM | POA: Diagnosis not present

## 2020-03-28 DIAGNOSIS — Z8701 Personal history of pneumonia (recurrent): Secondary | ICD-10-CM | POA: Diagnosis not present

## 2020-03-28 DIAGNOSIS — H409 Unspecified glaucoma: Secondary | ICD-10-CM | POA: Diagnosis not present

## 2020-03-29 DIAGNOSIS — M545 Low back pain, unspecified: Secondary | ICD-10-CM | POA: Diagnosis not present

## 2020-04-02 DIAGNOSIS — L89322 Pressure ulcer of left buttock, stage 2: Secondary | ICD-10-CM | POA: Diagnosis not present

## 2020-04-02 DIAGNOSIS — D631 Anemia in chronic kidney disease: Secondary | ICD-10-CM | POA: Diagnosis not present

## 2020-04-02 DIAGNOSIS — J449 Chronic obstructive pulmonary disease, unspecified: Secondary | ICD-10-CM | POA: Diagnosis not present

## 2020-04-02 DIAGNOSIS — M545 Low back pain, unspecified: Secondary | ICD-10-CM | POA: Diagnosis not present

## 2020-04-02 DIAGNOSIS — N183 Chronic kidney disease, stage 3 unspecified: Secondary | ICD-10-CM | POA: Diagnosis not present

## 2020-04-02 DIAGNOSIS — M25551 Pain in right hip: Secondary | ICD-10-CM | POA: Diagnosis not present

## 2020-04-05 DIAGNOSIS — M25551 Pain in right hip: Secondary | ICD-10-CM | POA: Diagnosis not present

## 2020-04-05 DIAGNOSIS — J449 Chronic obstructive pulmonary disease, unspecified: Secondary | ICD-10-CM | POA: Diagnosis not present

## 2020-04-05 DIAGNOSIS — D631 Anemia in chronic kidney disease: Secondary | ICD-10-CM | POA: Diagnosis not present

## 2020-04-05 DIAGNOSIS — N183 Chronic kidney disease, stage 3 unspecified: Secondary | ICD-10-CM | POA: Diagnosis not present

## 2020-04-05 DIAGNOSIS — M545 Low back pain, unspecified: Secondary | ICD-10-CM | POA: Diagnosis not present

## 2020-04-05 DIAGNOSIS — L89322 Pressure ulcer of left buttock, stage 2: Secondary | ICD-10-CM | POA: Diagnosis not present

## 2020-04-07 DIAGNOSIS — R238 Other skin changes: Secondary | ICD-10-CM | POA: Diagnosis not present

## 2020-04-07 DIAGNOSIS — G8929 Other chronic pain: Secondary | ICD-10-CM | POA: Diagnosis not present

## 2020-04-08 DIAGNOSIS — M545 Low back pain, unspecified: Secondary | ICD-10-CM | POA: Diagnosis not present

## 2020-04-08 DIAGNOSIS — M5441 Lumbago with sciatica, right side: Secondary | ICD-10-CM | POA: Diagnosis not present

## 2020-04-09 DIAGNOSIS — J449 Chronic obstructive pulmonary disease, unspecified: Secondary | ICD-10-CM | POA: Diagnosis not present

## 2020-04-09 DIAGNOSIS — N183 Chronic kidney disease, stage 3 unspecified: Secondary | ICD-10-CM | POA: Diagnosis not present

## 2020-04-09 DIAGNOSIS — M25551 Pain in right hip: Secondary | ICD-10-CM | POA: Diagnosis not present

## 2020-04-09 DIAGNOSIS — D631 Anemia in chronic kidney disease: Secondary | ICD-10-CM | POA: Diagnosis not present

## 2020-04-09 DIAGNOSIS — M545 Low back pain, unspecified: Secondary | ICD-10-CM | POA: Diagnosis not present

## 2020-04-09 DIAGNOSIS — L89322 Pressure ulcer of left buttock, stage 2: Secondary | ICD-10-CM | POA: Diagnosis not present

## 2020-04-11 DIAGNOSIS — L89322 Pressure ulcer of left buttock, stage 2: Secondary | ICD-10-CM | POA: Diagnosis not present

## 2020-04-11 DIAGNOSIS — M545 Low back pain, unspecified: Secondary | ICD-10-CM | POA: Diagnosis not present

## 2020-04-11 DIAGNOSIS — J449 Chronic obstructive pulmonary disease, unspecified: Secondary | ICD-10-CM | POA: Diagnosis not present

## 2020-04-11 DIAGNOSIS — M25551 Pain in right hip: Secondary | ICD-10-CM | POA: Diagnosis not present

## 2020-04-11 DIAGNOSIS — D631 Anemia in chronic kidney disease: Secondary | ICD-10-CM | POA: Diagnosis not present

## 2020-04-11 DIAGNOSIS — N183 Chronic kidney disease, stage 3 unspecified: Secondary | ICD-10-CM | POA: Diagnosis not present

## 2020-04-14 DIAGNOSIS — N183 Chronic kidney disease, stage 3 unspecified: Secondary | ICD-10-CM | POA: Diagnosis not present

## 2020-04-14 DIAGNOSIS — J449 Chronic obstructive pulmonary disease, unspecified: Secondary | ICD-10-CM | POA: Diagnosis not present

## 2020-04-14 DIAGNOSIS — M25551 Pain in right hip: Secondary | ICD-10-CM | POA: Diagnosis not present

## 2020-04-14 DIAGNOSIS — D631 Anemia in chronic kidney disease: Secondary | ICD-10-CM | POA: Diagnosis not present

## 2020-04-14 DIAGNOSIS — L89322 Pressure ulcer of left buttock, stage 2: Secondary | ICD-10-CM | POA: Diagnosis not present

## 2020-04-14 DIAGNOSIS — M545 Low back pain, unspecified: Secondary | ICD-10-CM | POA: Diagnosis not present

## 2020-04-15 ENCOUNTER — Emergency Department (HOSPITAL_COMMUNITY)
Admission: EM | Admit: 2020-04-15 | Discharge: 2020-04-15 | Disposition: A | Discharge status: Home / Self Care | Payer: Medicare Other | Attending: Emergency Medicine | Admitting: Emergency Medicine

## 2020-04-15 ENCOUNTER — Encounter (HOSPITAL_COMMUNITY): Payer: Self-pay

## 2020-04-15 ENCOUNTER — Other Ambulatory Visit: Payer: Self-pay

## 2020-04-15 DIAGNOSIS — Y92002 Bathroom of unspecified non-institutional (private) residence single-family (private) house as the place of occurrence of the external cause: Secondary | ICD-10-CM | POA: Diagnosis not present

## 2020-04-15 DIAGNOSIS — W19XXXA Unspecified fall, initial encounter: Secondary | ICD-10-CM

## 2020-04-15 DIAGNOSIS — I10 Essential (primary) hypertension: Secondary | ICD-10-CM | POA: Diagnosis not present

## 2020-04-15 DIAGNOSIS — R519 Headache, unspecified: Secondary | ICD-10-CM | POA: Diagnosis not present

## 2020-04-15 DIAGNOSIS — W1839XA Other fall on same level, initial encounter: Secondary | ICD-10-CM | POA: Diagnosis not present

## 2020-04-15 DIAGNOSIS — Z8673 Personal history of transient ischemic attack (TIA), and cerebral infarction without residual deficits: Secondary | ICD-10-CM | POA: Insufficient documentation

## 2020-04-15 DIAGNOSIS — G8929 Other chronic pain: Secondary | ICD-10-CM | POA: Diagnosis not present

## 2020-04-15 DIAGNOSIS — Y9301 Activity, walking, marching and hiking: Secondary | ICD-10-CM | POA: Insufficient documentation

## 2020-04-15 DIAGNOSIS — Z743 Need for continuous supervision: Secondary | ICD-10-CM | POA: Diagnosis not present

## 2020-04-15 DIAGNOSIS — Z87891 Personal history of nicotine dependence: Secondary | ICD-10-CM | POA: Diagnosis not present

## 2020-04-15 DIAGNOSIS — M25552 Pain in left hip: Secondary | ICD-10-CM | POA: Insufficient documentation

## 2020-04-15 DIAGNOSIS — R52 Pain, unspecified: Secondary | ICD-10-CM | POA: Diagnosis not present

## 2020-04-15 DIAGNOSIS — R279 Unspecified lack of coordination: Secondary | ICD-10-CM | POA: Diagnosis not present

## 2020-04-15 DIAGNOSIS — R0902 Hypoxemia: Secondary | ICD-10-CM | POA: Diagnosis not present

## 2020-04-15 MED ORDER — OXYCODONE-ACETAMINOPHEN 5-325 MG PO TABS
2.0000 | ORAL_TABLET | Freq: Once | ORAL | Status: AC
  Administered 2020-04-15: 2 via ORAL
  Filled 2020-04-15: qty 2

## 2020-04-15 NOTE — ED Notes (Signed)
Called [facility] at this time inquiring about eta for [facility]- Per dispatch [facility] is on scene at this time

## 2020-04-15 NOTE — ED Notes (Signed)
Pt reports that she is not wanting to go home at home at this time. MD made aware.

## 2020-04-15 NOTE — ED Provider Notes (Signed)
[facility] DEPT Provider Note   CSN: [id number] Arrival date & time: 04/15/20  1314     History Chief Complaint  Patient presents with  . Fall    Katrina Burnett is a 86 y.o. female who presents with a cc of fall.  Patient states that she got up in the [place] use the bathroom and her walker wheel got caught.  She fell onto her bottom.  She has a history of chronic right hip pain from a previous dog attack.  She denies hitting her head or lose of consciousness.  She lives in assisted living and is otherwise independent.  She was able to ambulate here to the bathroom from her bed.  She denies numbness or tingling.  She has no other pain complaints.  HPI     Past Medical History:  Diagnosis Date  . Allergic rhinitis   . Anxiety   . Arthritis    "hands" (11/25/2016)  . Barrett's esophagus 06/1997  . CAP (community acquired pneumonia) 11/25/2016   "this is my 3rd time having pneumonia" (11/25/2016)  . Chondromalacia   . Chronic lower back pain   . Depression   . Diverticulosis   . GERD (gastroesophageal reflux disease)   . Glaucoma   . History of hiatal hernia   . History of stomach ulcers   . Lumbar compression fracture (HCC)    L1 and L2  . Osteoporosis   . Pneumonia   . Polymyalgia ([place])   . Stroke ([place])   . Tubular adenoma of colon 06/1999  . Vaginitis   . Varicose veins     Patient Active Problem List   Diagnosis Date Noted  . Acute respiratory failure with hypoxia ([place]) 01/24/2020  . Acute hypoxemic respiratory failure ([place]) 01/23/2020  . Depression   . Thrombocytosis   . GIB (gastrointestinal bleeding) 11/21/2019  . Esophageal mass 11/19/2019  . Hiatal hernia   . Esophagitis   . Pelvic fracture ([place]) 11/11/2019  . Pneumothorax on left 10/24/2018  . Pneumothorax, left 10/24/2018  . Acute on chronic anemia 11/25/2016  . Hyponatremia 11/25/2016  . Fracture, intertrochanteric, right femur ([place]) 05/26/2015  . Closed fracture of  femur ([place])   . Acute blood loss anemia   . Adjustment disorder with mixed anxiety and depressed mood   . Fall   . Abnormality of gait   . Femur fracture, right, closed, initial encounter 05/23/2015  . Fracture of femoral neck, right, closed ([place]) 05/22/2015  . Femoral neck fracture, right, closed, initial encounter 05/22/2015  . Syncope 05/30/2012  . Nausea & vomiting 05/30/2012  . Chest pain 05/30/2012  . Hypotension 05/30/2012  . GERD (gastroesophageal reflux disease) 12/16/2011  . Lumbar compression fracture ([place])   . Polymyalgia ([place])     Past Surgical History:  Procedure Laterality Date  . BACK SURGERY    . BIOPSY  11/19/2019   Procedure: BIOPSY;  Surgeon: Gatha Mayer, MD;  Location: [facility];  Service: Endoscopy;;  . CATARACT EXTRACTION W/ INTRAOCULAR LENS  IMPLANT, BILATERAL Bilateral   . ESOPHAGOGASTRODUODENOSCOPY (EGD) WITH PROPOFOL N/A 11/19/2019   Procedure: ESOPHAGOGASTRODUODENOSCOPY (EGD) WITH PROPOFOL;  Surgeon: Gatha Mayer, MD;  Location: [facility];  Service: Endoscopy;  Laterality: N/A;  . FIXATION KYPHOPLASTY LUMBAR SPINE  10/12/10; 09/20/11  . FRACTURE SURGERY    . IR KYPHO THORACIC WITH BONE BIOPSY  03/24/2018  . IR VERTEBROPLASTY CERV/THOR BX INC UNI/BIL INC/INJECT/IMAGING  03/24/2018  . LUMBAR SPINE SURGERY     "  did OR on 2 fractures in my lower back"  . LUNG BIOPSY    . ORIF HIP FRACTURE Right 05/23/2015   Procedure: OPEN REDUCTION INTERNAL FIXATION HIP;  Surgeon: Frederik Pear, MD;  Location: [facility];  Service: Orthopedics;  Laterality: Right;  Marland Kitchen VARICOSE VEIN SURGERY     vein ligation and stripping "years ago"     OB History    Gravida  4   Para  4   Term  4   Preterm      AB      Living  4     SAB      TAB      Ectopic      Multiple      Live Births              Family History  Problem Relation Age of Onset  . Colon cancer Mother 53  . Congestive Heart Failure Mother   . Heart disease Father   . Alcohol abuse  Father   . Heart disease Maternal Grandmother   . Fibromyalgia Son     Social History   Tobacco Use  . Smoking status: Former Smoker    Years: 1.00    Types: Cigarettes  . Smokeless tobacco: Never Used  . Tobacco comment: At age 64 yrs old but nothing since   Vaping Use  . Vaping Use: Never used  Substance Use Topics  . Alcohol use: No  . Drug use: No    Home Medications Prior to Admission medications   Medication Sig Start Date End Date Taking? Authorizing Provider  citalopram (CELEXA) 40 MG tablet Take 60 mg by mouth daily.     [provider]  cyclobenzaprine (FLEXERIL) 10 MG tablet Take 10 mg by mouth every 8 (eight) hours as needed for muscle spasms.    [provider]  dorzolamide (TRUSOPT) 2 % ophthalmic solution Place 1 drop into both eyes 2 (two) times daily.     [provider]  ferrous sulfate 325 (65 FE) MG tablet Take 1 tablet (325 mg total) by mouth 2 (two) times daily with a meal. 01/27/20   Ghimire, Henreitta Leber, MD  gabapentin (NEURONTIN) 300 MG capsule Take 600 mg by mouth at bedtime.    [provider]  latanoprost (XALATAN) 0.005 % ophthalmic solution Place 1 drop into both eyes at bedtime.    [provider]  LORazepam (ATIVAN) 2 MG tablet Take 2 mg by mouth in the morning and at bedtime.     [provider]  metoCLOPramide (REGLAN) 10 MG tablet Take 1 tablet (10 mg total) by mouth every 6 (six) hours as needed for nausea (headache, recommend taking with benadryl (diphenhydramine) 25mg ). Patient not taking: Reported on 03/19/2020 11/25/19   Gareth Morgan, MD  Multiple Vitamin (MULTIVITAMIN) tablet Take 1 tablet by mouth at bedtime.     [provider]  oxyCODONE-acetaminophen (PERCOCET) 10-325 MG tablet Take 1 tablet by mouth every 6 (six) hours as needed for pain.    [provider]  pantoprazole (PROTONIX) 40 MG tablet Take 1 tablet (40 mg total) by mouth daily. 11/23/19 03/19/20  Shahmehdi,  Valeria Batman, MD  polyethylene glycol (MIRALAX / GLYCOLAX) 17 g packet Take 17 g by mouth daily as needed for mild constipation. Patient not taking: Reported on 01/23/2020 11/12/19   Raiford Noble Latif, DO  potassium chloride SA (KLOR-CON) 20 MEQ tablet Take 1 tablet (20 mEq total) by mouth 2 (two) times daily. Patient  not taking: Reported on 01/23/2020 12/01/19   Dorie Rank, MD  Probiotic Product (ALIGN) 4 MG CAPS Take 4 mg by mouth at bedtime.     [provider]  sucralfate (CARAFATE) 1 GM/10ML suspension Take 10 mLs (1 g total) by mouth 3 (three) times daily as needed. Patient not taking: Reported on 01/23/2020 11/23/19 12/23/19  Deatra James, MD  traZODone (DESYREL) 50 MG tablet Take 0.5-1 tablets (25-50 mg total) by mouth at bedtime as needed for sleep. Patient taking differently: Take 50 mg by mouth at bedtime.  10/28/18   [name], MD    Allergies    Avelox [moxifloxacin hcl in nacl], Biaxin [clarithromycin], Cefuroxime, Levaquin [levofloxacin in d5w], Minocycline, Paxil [paroxetine hcl], Prednisone, Remeron [mirtazapine], Sulfur, Trimox [amoxicillin], Viibryd [vilazodone hcl], and Penicillins  Review of Systems   Review of Systems Ten systems reviewed and are negative for acute change, except as noted in the HPI.   Physical Exam Updated Vital Signs There were no vitals taken for this visit.  Physical Exam Vitals and nursing note reviewed.  Constitutional:      General: She is not in acute distress.    Appearance: She is well-developed. She is not diaphoretic.  HENT:     Head: Normocephalic and atraumatic.  Eyes:     General: No scleral icterus.    Conjunctiva/sclera: Conjunctivae normal.  Cardiovascular:     Rate and Rhythm: Normal rate and regular rhythm.     Heart sounds: Normal heart sounds. No murmur heard.  No friction rub. No gallop.   Pulmonary:     Effort: Pulmonary effort is normal. No respiratory distress.     Breath sounds: Normal breath sounds.    Abdominal:     General: Bowel sounds are normal. There is no distension.     Palpations: Abdomen is soft. There is no mass.     Tenderness: There is no abdominal tenderness. There is no guarding.  Musculoskeletal:     Cervical back: Normal range of motion.     Comments: Ambulatory, FROM of both hips No no deformities or bruising.  No obvious trauma elsewhere.  Neurovascularly intact  Skin:    General: Skin is warm and dry.  Neurological:     Mental Status: She is alert and oriented to person, place, and time.  Psychiatric:        Behavior: Behavior normal.     ED Results / Procedures / Treatments   Labs (all labs ordered are listed, but only abnormal results are displayed) Labs Reviewed - No data to display  EKG None  Radiology No results found.  Procedures Procedures (including critical care time)  Medications Ordered in ED Medications - No data to display  ED Course  I have reviewed the triage vital signs and the nursing notes.  Pertinent labs & imaging results that were available during my care of the patient were reviewed by me and considered in my medical decision making (see chart for details).    MDM Rules/Calculators/A&P                          Patient here for evaluation after fall.  She is ambulating.  She did not hit her head or lose consciousness.  She does not wish to stay in the ER any longer and does not appear to need any further work-up.  She takes Percocet every 6 hours and I have ordered her daily dose.  She feels otherwise  appropriate for discharge at this time with strict return precautions.  Patient seen and shared visit with Dr. Sedonia Small Final Clinical Impression(s) / ED Diagnoses Final diagnoses:  None    Rx / DC Orders ED Discharge Orders    None       Margarita Mail, PA-C 04/15/20 1438    Maudie Flakes, MD 04/15/20 1521

## 2020-04-15 NOTE — ED Notes (Signed)
Pt wanted to go home when she first arrived but now says she is in pain and doesn't want to go home feeling bad

## 2020-04-15 NOTE — ED Triage Notes (Addendum)
Pt BIB EMS. Pt fell last night while walking back from the bathroom. Pt is c/o right leg pain. No blood thinners. Pt lives at [facility] at [facility] (nursing home)  BP-142/76 HR-79 RR-16 O2-97% RA Temp-98.2

## 2020-04-15 NOTE — ED Notes (Signed)
Report given to facility nurse.

## 2020-04-18 DIAGNOSIS — N183 Chronic kidney disease, stage 3 unspecified: Secondary | ICD-10-CM | POA: Diagnosis not present

## 2020-04-18 DIAGNOSIS — D631 Anemia in chronic kidney disease: Secondary | ICD-10-CM | POA: Diagnosis not present

## 2020-04-18 DIAGNOSIS — J449 Chronic obstructive pulmonary disease, unspecified: Secondary | ICD-10-CM | POA: Diagnosis not present

## 2020-04-18 DIAGNOSIS — M25551 Pain in right hip: Secondary | ICD-10-CM | POA: Diagnosis not present

## 2020-04-18 DIAGNOSIS — M545 Low back pain, unspecified: Secondary | ICD-10-CM | POA: Diagnosis not present

## 2020-04-18 DIAGNOSIS — L89322 Pressure ulcer of left buttock, stage 2: Secondary | ICD-10-CM | POA: Diagnosis not present

## 2020-04-21 DIAGNOSIS — D631 Anemia in chronic kidney disease: Secondary | ICD-10-CM | POA: Diagnosis not present

## 2020-04-21 DIAGNOSIS — L89322 Pressure ulcer of left buttock, stage 2: Secondary | ICD-10-CM | POA: Diagnosis not present

## 2020-04-21 DIAGNOSIS — M25551 Pain in right hip: Secondary | ICD-10-CM | POA: Diagnosis not present

## 2020-04-21 DIAGNOSIS — J449 Chronic obstructive pulmonary disease, unspecified: Secondary | ICD-10-CM | POA: Diagnosis not present

## 2020-04-21 DIAGNOSIS — M545 Low back pain, unspecified: Secondary | ICD-10-CM | POA: Diagnosis not present

## 2020-04-21 DIAGNOSIS — N183 Chronic kidney disease, stage 3 unspecified: Secondary | ICD-10-CM | POA: Diagnosis not present

## 2020-04-22 DIAGNOSIS — N183 Chronic kidney disease, stage 3 unspecified: Secondary | ICD-10-CM | POA: Diagnosis not present

## 2020-04-22 DIAGNOSIS — M545 Low back pain, unspecified: Secondary | ICD-10-CM | POA: Diagnosis not present

## 2020-04-22 DIAGNOSIS — J449 Chronic obstructive pulmonary disease, unspecified: Secondary | ICD-10-CM | POA: Diagnosis not present

## 2020-04-22 DIAGNOSIS — L89322 Pressure ulcer of left buttock, stage 2: Secondary | ICD-10-CM | POA: Diagnosis not present

## 2020-04-22 DIAGNOSIS — M25551 Pain in right hip: Secondary | ICD-10-CM | POA: Diagnosis not present

## 2020-04-22 DIAGNOSIS — D631 Anemia in chronic kidney disease: Secondary | ICD-10-CM | POA: Diagnosis not present

## 2020-04-24 DIAGNOSIS — D631 Anemia in chronic kidney disease: Secondary | ICD-10-CM | POA: Diagnosis not present

## 2020-04-24 DIAGNOSIS — J449 Chronic obstructive pulmonary disease, unspecified: Secondary | ICD-10-CM | POA: Diagnosis not present

## 2020-04-24 DIAGNOSIS — M25551 Pain in right hip: Secondary | ICD-10-CM | POA: Diagnosis not present

## 2020-04-24 DIAGNOSIS — M545 Low back pain, unspecified: Secondary | ICD-10-CM | POA: Diagnosis not present

## 2020-04-24 DIAGNOSIS — L89322 Pressure ulcer of left buttock, stage 2: Secondary | ICD-10-CM | POA: Diagnosis not present

## 2020-04-24 DIAGNOSIS — N183 Chronic kidney disease, stage 3 unspecified: Secondary | ICD-10-CM | POA: Diagnosis not present

## 2020-04-25 ENCOUNTER — Other Ambulatory Visit: Payer: Self-pay | Admitting: Physical Medicine and Rehabilitation

## 2020-04-25 DIAGNOSIS — M5416 Radiculopathy, lumbar region: Secondary | ICD-10-CM | POA: Diagnosis not present

## 2020-04-27 DIAGNOSIS — G8929 Other chronic pain: Secondary | ICD-10-CM | POA: Diagnosis not present

## 2020-04-27 DIAGNOSIS — Z8616 Personal history of COVID-19: Secondary | ICD-10-CM | POA: Diagnosis not present

## 2020-04-27 DIAGNOSIS — L89322 Pressure ulcer of left buttock, stage 2: Secondary | ICD-10-CM | POA: Diagnosis not present

## 2020-04-27 DIAGNOSIS — J449 Chronic obstructive pulmonary disease, unspecified: Secondary | ICD-10-CM | POA: Diagnosis not present

## 2020-04-27 DIAGNOSIS — D631 Anemia in chronic kidney disease: Secondary | ICD-10-CM | POA: Diagnosis not present

## 2020-04-27 DIAGNOSIS — M25551 Pain in right hip: Secondary | ICD-10-CM | POA: Diagnosis not present

## 2020-04-27 DIAGNOSIS — N183 Chronic kidney disease, stage 3 unspecified: Secondary | ICD-10-CM | POA: Diagnosis not present

## 2020-04-27 DIAGNOSIS — F32A Depression, unspecified: Secondary | ICD-10-CM | POA: Diagnosis not present

## 2020-04-27 DIAGNOSIS — F419 Anxiety disorder, unspecified: Secondary | ICD-10-CM | POA: Diagnosis not present

## 2020-04-27 DIAGNOSIS — J309 Allergic rhinitis, unspecified: Secondary | ICD-10-CM | POA: Diagnosis not present

## 2020-04-27 DIAGNOSIS — Z8701 Personal history of pneumonia (recurrent): Secondary | ICD-10-CM | POA: Diagnosis not present

## 2020-04-27 DIAGNOSIS — M545 Low back pain, unspecified: Secondary | ICD-10-CM | POA: Diagnosis not present

## 2020-04-27 DIAGNOSIS — K219 Gastro-esophageal reflux disease without esophagitis: Secondary | ICD-10-CM | POA: Diagnosis not present

## 2020-04-27 DIAGNOSIS — K5790 Diverticulosis of intestine, part unspecified, without perforation or abscess without bleeding: Secondary | ICD-10-CM | POA: Diagnosis not present

## 2020-04-27 DIAGNOSIS — M810 Age-related osteoporosis without current pathological fracture: Secondary | ICD-10-CM | POA: Diagnosis not present

## 2020-04-27 DIAGNOSIS — H409 Unspecified glaucoma: Secondary | ICD-10-CM | POA: Diagnosis not present

## 2020-04-29 DIAGNOSIS — M545 Low back pain, unspecified: Secondary | ICD-10-CM | POA: Diagnosis not present

## 2020-04-29 DIAGNOSIS — L89322 Pressure ulcer of left buttock, stage 2: Secondary | ICD-10-CM | POA: Diagnosis not present

## 2020-04-29 DIAGNOSIS — M25551 Pain in right hip: Secondary | ICD-10-CM | POA: Diagnosis not present

## 2020-04-29 DIAGNOSIS — D631 Anemia in chronic kidney disease: Secondary | ICD-10-CM | POA: Diagnosis not present

## 2020-04-29 DIAGNOSIS — N183 Chronic kidney disease, stage 3 unspecified: Secondary | ICD-10-CM | POA: Diagnosis not present

## 2020-04-29 DIAGNOSIS — J449 Chronic obstructive pulmonary disease, unspecified: Secondary | ICD-10-CM | POA: Diagnosis not present

## 2020-05-03 DIAGNOSIS — J449 Chronic obstructive pulmonary disease, unspecified: Secondary | ICD-10-CM | POA: Diagnosis not present

## 2020-05-03 DIAGNOSIS — L89322 Pressure ulcer of left buttock, stage 2: Secondary | ICD-10-CM | POA: Diagnosis not present

## 2020-05-03 DIAGNOSIS — D631 Anemia in chronic kidney disease: Secondary | ICD-10-CM | POA: Diagnosis not present

## 2020-05-03 DIAGNOSIS — M545 Low back pain, unspecified: Secondary | ICD-10-CM | POA: Diagnosis not present

## 2020-05-03 DIAGNOSIS — M25551 Pain in right hip: Secondary | ICD-10-CM | POA: Diagnosis not present

## 2020-05-03 DIAGNOSIS — N183 Chronic kidney disease, stage 3 unspecified: Secondary | ICD-10-CM | POA: Diagnosis not present

## 2020-05-05 ENCOUNTER — Ambulatory Visit
Admission: RE | Admit: 2020-05-05 | Discharge: 2020-05-05 | Disposition: A | Discharge status: Home / Self Care | Payer: Medicare Other | Source: Ambulatory Visit | Attending: Physical Medicine and Rehabilitation | Admitting: Physical Medicine and Rehabilitation

## 2020-05-05 ENCOUNTER — Other Ambulatory Visit: Payer: Self-pay

## 2020-05-05 DIAGNOSIS — M48061 Spinal stenosis, lumbar region without neurogenic claudication: Secondary | ICD-10-CM | POA: Diagnosis not present

## 2020-05-05 DIAGNOSIS — M5416 Radiculopathy, lumbar region: Secondary | ICD-10-CM

## 2020-05-05 MED ORDER — IOPAMIDOL (ISOVUE-M 200) INJECTION 41%
1.0000 mL | Freq: Once | INTRAMUSCULAR | Status: AC
  Administered 2020-05-05: 1 mL via EPIDURAL

## 2020-05-05 MED ORDER — METHYLPREDNISOLONE ACETATE 40 MG/ML INJ SUSP (RADIOLOG
120.0000 mg | Freq: Once | INTRAMUSCULAR | Status: AC
  Administered 2020-05-05: 120 mg via EPIDURAL

## 2020-05-05 NOTE — Discharge Instructions (Signed)

## 2020-05-06 DIAGNOSIS — N183 Chronic kidney disease, stage 3 unspecified: Secondary | ICD-10-CM | POA: Diagnosis not present

## 2020-05-06 DIAGNOSIS — L89322 Pressure ulcer of left buttock, stage 2: Secondary | ICD-10-CM | POA: Diagnosis not present

## 2020-05-06 DIAGNOSIS — J449 Chronic obstructive pulmonary disease, unspecified: Secondary | ICD-10-CM | POA: Diagnosis not present

## 2020-05-06 DIAGNOSIS — D631 Anemia in chronic kidney disease: Secondary | ICD-10-CM | POA: Diagnosis not present

## 2020-05-07 DIAGNOSIS — L89322 Pressure ulcer of left buttock, stage 2: Secondary | ICD-10-CM | POA: Diagnosis not present

## 2020-05-07 DIAGNOSIS — J449 Chronic obstructive pulmonary disease, unspecified: Secondary | ICD-10-CM | POA: Diagnosis not present

## 2020-05-07 DIAGNOSIS — N183 Chronic kidney disease, stage 3 unspecified: Secondary | ICD-10-CM | POA: Diagnosis not present

## 2020-05-07 DIAGNOSIS — M545 Low back pain, unspecified: Secondary | ICD-10-CM | POA: Diagnosis not present

## 2020-05-07 DIAGNOSIS — M25551 Pain in right hip: Secondary | ICD-10-CM | POA: Diagnosis not present

## 2020-05-07 DIAGNOSIS — D631 Anemia in chronic kidney disease: Secondary | ICD-10-CM | POA: Diagnosis not present

## 2020-05-08 DIAGNOSIS — J449 Chronic obstructive pulmonary disease, unspecified: Secondary | ICD-10-CM | POA: Diagnosis not present

## 2020-05-08 DIAGNOSIS — M25551 Pain in right hip: Secondary | ICD-10-CM | POA: Diagnosis not present

## 2020-05-08 DIAGNOSIS — D631 Anemia in chronic kidney disease: Secondary | ICD-10-CM | POA: Diagnosis not present

## 2020-05-08 DIAGNOSIS — M545 Low back pain, unspecified: Secondary | ICD-10-CM | POA: Diagnosis not present

## 2020-05-08 DIAGNOSIS — N183 Chronic kidney disease, stage 3 unspecified: Secondary | ICD-10-CM | POA: Diagnosis not present

## 2020-05-08 DIAGNOSIS — L89322 Pressure ulcer of left buttock, stage 2: Secondary | ICD-10-CM | POA: Diagnosis not present

## 2020-05-10 DIAGNOSIS — N183 Chronic kidney disease, stage 3 unspecified: Secondary | ICD-10-CM | POA: Diagnosis not present

## 2020-05-10 DIAGNOSIS — D631 Anemia in chronic kidney disease: Secondary | ICD-10-CM | POA: Diagnosis not present

## 2020-05-10 DIAGNOSIS — M545 Low back pain, unspecified: Secondary | ICD-10-CM | POA: Diagnosis not present

## 2020-05-10 DIAGNOSIS — M25551 Pain in right hip: Secondary | ICD-10-CM | POA: Diagnosis not present

## 2020-05-10 DIAGNOSIS — L89322 Pressure ulcer of left buttock, stage 2: Secondary | ICD-10-CM | POA: Diagnosis not present

## 2020-05-10 DIAGNOSIS — J449 Chronic obstructive pulmonary disease, unspecified: Secondary | ICD-10-CM | POA: Diagnosis not present

## 2020-05-12 DIAGNOSIS — L89322 Pressure ulcer of left buttock, stage 2: Secondary | ICD-10-CM | POA: Diagnosis not present

## 2020-05-12 DIAGNOSIS — J449 Chronic obstructive pulmonary disease, unspecified: Secondary | ICD-10-CM | POA: Diagnosis not present

## 2020-05-12 DIAGNOSIS — D631 Anemia in chronic kidney disease: Secondary | ICD-10-CM | POA: Diagnosis not present

## 2020-05-12 DIAGNOSIS — M545 Low back pain, unspecified: Secondary | ICD-10-CM | POA: Diagnosis not present

## 2020-05-12 DIAGNOSIS — N183 Chronic kidney disease, stage 3 unspecified: Secondary | ICD-10-CM | POA: Diagnosis not present

## 2020-05-12 DIAGNOSIS — M25551 Pain in right hip: Secondary | ICD-10-CM | POA: Diagnosis not present

## 2020-05-14 DIAGNOSIS — D631 Anemia in chronic kidney disease: Secondary | ICD-10-CM | POA: Diagnosis not present

## 2020-05-14 DIAGNOSIS — M25551 Pain in right hip: Secondary | ICD-10-CM | POA: Diagnosis not present

## 2020-05-14 DIAGNOSIS — N183 Chronic kidney disease, stage 3 unspecified: Secondary | ICD-10-CM | POA: Diagnosis not present

## 2020-05-14 DIAGNOSIS — M545 Low back pain, unspecified: Secondary | ICD-10-CM | POA: Diagnosis not present

## 2020-05-14 DIAGNOSIS — J449 Chronic obstructive pulmonary disease, unspecified: Secondary | ICD-10-CM | POA: Diagnosis not present

## 2020-05-14 DIAGNOSIS — L89322 Pressure ulcer of left buttock, stage 2: Secondary | ICD-10-CM | POA: Diagnosis not present

## 2020-05-15 DIAGNOSIS — S80819A Abrasion, unspecified lower leg, initial encounter: Secondary | ICD-10-CM | POA: Diagnosis not present

## 2020-05-16 DIAGNOSIS — L89322 Pressure ulcer of left buttock, stage 2: Secondary | ICD-10-CM | POA: Diagnosis not present

## 2020-05-16 DIAGNOSIS — J449 Chronic obstructive pulmonary disease, unspecified: Secondary | ICD-10-CM | POA: Diagnosis not present

## 2020-05-16 DIAGNOSIS — N183 Chronic kidney disease, stage 3 unspecified: Secondary | ICD-10-CM | POA: Diagnosis not present

## 2020-05-16 DIAGNOSIS — D631 Anemia in chronic kidney disease: Secondary | ICD-10-CM | POA: Diagnosis not present

## 2020-05-16 DIAGNOSIS — M25551 Pain in right hip: Secondary | ICD-10-CM | POA: Diagnosis not present

## 2020-05-16 DIAGNOSIS — M545 Low back pain, unspecified: Secondary | ICD-10-CM | POA: Diagnosis not present

## 2020-05-19 DIAGNOSIS — J449 Chronic obstructive pulmonary disease, unspecified: Secondary | ICD-10-CM | POA: Diagnosis not present

## 2020-05-19 DIAGNOSIS — L89322 Pressure ulcer of left buttock, stage 2: Secondary | ICD-10-CM | POA: Diagnosis not present

## 2020-05-19 DIAGNOSIS — D631 Anemia in chronic kidney disease: Secondary | ICD-10-CM | POA: Diagnosis not present

## 2020-05-19 DIAGNOSIS — N183 Chronic kidney disease, stage 3 unspecified: Secondary | ICD-10-CM | POA: Diagnosis not present

## 2020-05-19 DIAGNOSIS — M545 Low back pain, unspecified: Secondary | ICD-10-CM | POA: Diagnosis not present

## 2020-05-19 DIAGNOSIS — M25551 Pain in right hip: Secondary | ICD-10-CM | POA: Diagnosis not present

## 2020-05-21 DIAGNOSIS — M25551 Pain in right hip: Secondary | ICD-10-CM | POA: Diagnosis not present

## 2020-05-21 DIAGNOSIS — D631 Anemia in chronic kidney disease: Secondary | ICD-10-CM | POA: Diagnosis not present

## 2020-05-21 DIAGNOSIS — J449 Chronic obstructive pulmonary disease, unspecified: Secondary | ICD-10-CM | POA: Diagnosis not present

## 2020-05-21 DIAGNOSIS — N183 Chronic kidney disease, stage 3 unspecified: Secondary | ICD-10-CM | POA: Diagnosis not present

## 2020-05-21 DIAGNOSIS — M545 Low back pain, unspecified: Secondary | ICD-10-CM | POA: Diagnosis not present

## 2020-05-21 DIAGNOSIS — L89322 Pressure ulcer of left buttock, stage 2: Secondary | ICD-10-CM | POA: Diagnosis not present

## 2020-05-23 DIAGNOSIS — M25551 Pain in right hip: Secondary | ICD-10-CM | POA: Diagnosis not present

## 2020-05-23 DIAGNOSIS — S42031A Displaced fracture of lateral end of right clavicle, initial encounter for closed fracture: Secondary | ICD-10-CM | POA: Diagnosis not present

## 2020-05-23 DIAGNOSIS — L89322 Pressure ulcer of left buttock, stage 2: Secondary | ICD-10-CM | POA: Diagnosis not present

## 2020-05-23 DIAGNOSIS — D631 Anemia in chronic kidney disease: Secondary | ICD-10-CM | POA: Diagnosis not present

## 2020-05-23 DIAGNOSIS — N183 Chronic kidney disease, stage 3 unspecified: Secondary | ICD-10-CM | POA: Diagnosis not present

## 2020-05-23 DIAGNOSIS — M25511 Pain in right shoulder: Secondary | ICD-10-CM | POA: Diagnosis not present

## 2020-05-23 DIAGNOSIS — M545 Low back pain, unspecified: Secondary | ICD-10-CM | POA: Diagnosis not present

## 2020-05-23 DIAGNOSIS — S7291XA Unspecified fracture of right femur, initial encounter for closed fracture: Secondary | ICD-10-CM | POA: Diagnosis not present

## 2020-05-23 DIAGNOSIS — S3282XA Multiple fractures of pelvis without disruption of pelvic ring, initial encounter for closed fracture: Secondary | ICD-10-CM | POA: Diagnosis not present

## 2020-05-23 DIAGNOSIS — M25559 Pain in unspecified hip: Secondary | ICD-10-CM | POA: Diagnosis not present

## 2020-05-23 DIAGNOSIS — J449 Chronic obstructive pulmonary disease, unspecified: Secondary | ICD-10-CM | POA: Diagnosis not present

## 2020-06-06 DIAGNOSIS — S42021A Displaced fracture of shaft of right clavicle, initial encounter for closed fracture: Secondary | ICD-10-CM | POA: Diagnosis not present

## 2020-06-13 DIAGNOSIS — M5416 Radiculopathy, lumbar region: Secondary | ICD-10-CM | POA: Diagnosis not present

## 2020-07-14 DIAGNOSIS — Z1159 Encounter for screening for other viral diseases: Secondary | ICD-10-CM | POA: Diagnosis not present

## 2020-07-14 DIAGNOSIS — Z20828 Contact with and (suspected) exposure to other viral communicable diseases: Secondary | ICD-10-CM | POA: Diagnosis not present

## 2020-07-21 DIAGNOSIS — Z20828 Contact with and (suspected) exposure to other viral communicable diseases: Secondary | ICD-10-CM | POA: Diagnosis not present

## 2020-07-21 DIAGNOSIS — Z1159 Encounter for screening for other viral diseases: Secondary | ICD-10-CM | POA: Diagnosis not present

## 2020-07-24 ENCOUNTER — Emergency Department (HOSPITAL_COMMUNITY)
Admission: EM | Admit: 2020-07-24 | Discharge: 2020-07-24 | Disposition: A | Discharge status: Home / Self Care | Payer: Medicare Other | Attending: Emergency Medicine | Admitting: Emergency Medicine

## 2020-07-24 ENCOUNTER — Emergency Department (HOSPITAL_COMMUNITY): Payer: Medicare Other

## 2020-07-24 DIAGNOSIS — W01198A Fall on same level from slipping, tripping and stumbling with subsequent striking against other object, initial encounter: Secondary | ICD-10-CM | POA: Diagnosis not present

## 2020-07-24 DIAGNOSIS — Y9301 Activity, walking, marching and hiking: Secondary | ICD-10-CM | POA: Diagnosis not present

## 2020-07-24 DIAGNOSIS — S5001XA Contusion of right elbow, initial encounter: Secondary | ICD-10-CM

## 2020-07-24 DIAGNOSIS — Z87891 Personal history of nicotine dependence: Secondary | ICD-10-CM | POA: Diagnosis not present

## 2020-07-24 DIAGNOSIS — Z7401 Bed confinement status: Secondary | ICD-10-CM | POA: Diagnosis not present

## 2020-07-24 DIAGNOSIS — S59901A Unspecified injury of right elbow, initial encounter: Secondary | ICD-10-CM | POA: Diagnosis present

## 2020-07-24 DIAGNOSIS — W19XXXA Unspecified fall, initial encounter: Secondary | ICD-10-CM | POA: Diagnosis not present

## 2020-07-24 DIAGNOSIS — R531 Weakness: Secondary | ICD-10-CM | POA: Diagnosis not present

## 2020-07-24 DIAGNOSIS — G9389 Other specified disorders of brain: Secondary | ICD-10-CM | POA: Diagnosis not present

## 2020-07-24 DIAGNOSIS — Z79899 Other long term (current) drug therapy: Secondary | ICD-10-CM | POA: Diagnosis not present

## 2020-07-24 DIAGNOSIS — I6381 Other cerebral infarction due to occlusion or stenosis of small artery: Secondary | ICD-10-CM | POA: Diagnosis not present

## 2020-07-24 DIAGNOSIS — I6782 Cerebral ischemia: Secondary | ICD-10-CM | POA: Insufficient documentation

## 2020-07-24 DIAGNOSIS — R4182 Altered mental status, unspecified: Secondary | ICD-10-CM | POA: Diagnosis not present

## 2020-07-24 DIAGNOSIS — S2241XA Multiple fractures of ribs, right side, initial encounter for closed fracture: Secondary | ICD-10-CM | POA: Diagnosis not present

## 2020-07-24 DIAGNOSIS — S20211A Contusion of right front wall of thorax, initial encounter: Secondary | ICD-10-CM | POA: Insufficient documentation

## 2020-07-24 DIAGNOSIS — S51011A Laceration without foreign body of right elbow, initial encounter: Secondary | ICD-10-CM | POA: Diagnosis not present

## 2020-07-24 DIAGNOSIS — M2550 Pain in unspecified joint: Secondary | ICD-10-CM | POA: Diagnosis not present

## 2020-07-24 DIAGNOSIS — R9082 White matter disease, unspecified: Secondary | ICD-10-CM | POA: Diagnosis not present

## 2020-07-24 MED ORDER — ACETAMINOPHEN 325 MG PO TABS
650.0000 mg | ORAL_TABLET | Freq: Once | ORAL | Status: AC
  Administered 2020-07-24: 650 mg via ORAL
  Filled 2020-07-24: qty 2

## 2020-07-24 NOTE — ED Provider Notes (Signed)
[facility] EMERGENCY DEPARTMENT Provider Note   CSN: [id number] Arrival date & time: 07/24/20  3009     History Chief Complaint  Patient presents with  . Fall    Katrina Burnett is a 87 y.o. female.  The history is provided by the patient. No language interpreter was used.  Fall This is a new problem. The current episode started 6 to 12 hours ago. The problem has not changed since onset.Associated symptoms include chest pain. Nothing aggravates the symptoms. Nothing relieves the symptoms. She has tried nothing for the symptoms. The treatment provided no relief.   Pt reports she slipped and fell last night while trying to go to the bathroom.  Patient reports she struck her right ribs on her walker.  Patient hit her elbow and head on the floor she denies any loss of consciousness.  Patient reports she did not want to come to the emergency department but had pain in her right ribs and was concerned that she could have broken a rib patient has a history of multiple rib fractures and spinal fractures.  Patient denies any hip pain she denies any leg pain patient denies neck or back pain or injury    Past Medical History:  Diagnosis Date  . Allergic rhinitis   . Anxiety   . Arthritis    "hands" (11/25/2016)  . Barrett's esophagus 06/1997  . CAP (community acquired pneumonia) 11/25/2016   "this is my 3rd time having pneumonia" (11/25/2016)  . Chondromalacia   . Chronic lower back pain   . Depression   . Diverticulosis   . GERD (gastroesophageal reflux disease)   . Glaucoma   . History of hiatal hernia   . History of stomach ulcers   . Lumbar compression fracture (HCC)    L1 and L2  . Osteoporosis   . Pneumonia   . Polymyalgia ([place])   . Stroke ([place])   . Tubular adenoma of colon 06/1999  . Vaginitis   . Varicose veins     Patient Active Problem List   Diagnosis Date Noted  . Acute respiratory failure with hypoxia ([place]) 01/24/2020  . Acute hypoxemic respiratory  failure ([place]) 01/23/2020  . Depression   . Thrombocytosis   . GIB (gastrointestinal bleeding) 11/21/2019  . Esophageal mass 11/19/2019  . Hiatal hernia   . Esophagitis   . Pelvic fracture ([place]) 11/11/2019  . Pneumothorax on left 10/24/2018  . Pneumothorax, left 10/24/2018  . Acute on chronic anemia 11/25/2016  . Hyponatremia 11/25/2016  . Fracture, intertrochanteric, right femur ([place]) 05/26/2015  . Closed fracture of femur ([place])   . Acute blood loss anemia   . Adjustment disorder with mixed anxiety and depressed mood   . Fall   . Abnormality of gait   . Femur fracture, right, closed, initial encounter 05/23/2015  . Fracture of femoral neck, right, closed ([place]) 05/22/2015  . Femoral neck fracture, right, closed, initial encounter 05/22/2015  . Syncope 05/30/2012  . Nausea & vomiting 05/30/2012  . Chest pain 05/30/2012  . Hypotension 05/30/2012  . GERD (gastroesophageal reflux disease) 12/16/2011  . Lumbar compression fracture ([place])   . Polymyalgia ([place])     Past Surgical History:  Procedure Laterality Date  . BACK SURGERY    . BIOPSY  11/19/2019   Procedure: BIOPSY;  Surgeon: Gatha Mayer, MD;  Location: [place];  Service: Endoscopy;;  . CATARACT EXTRACTION W/ INTRAOCULAR LENS  IMPLANT, BILATERAL Bilateral   . ESOPHAGOGASTRODUODENOSCOPY (EGD) WITH PROPOFOL N/A  11/19/2019   Procedure: ESOPHAGOGASTRODUODENOSCOPY (EGD) WITH PROPOFOL;  Surgeon: Gatha Mayer, MD;  Location: [place];  Service: Endoscopy;  Laterality: N/A;  . FIXATION KYPHOPLASTY LUMBAR SPINE  10/12/10; 09/20/11  . FRACTURE SURGERY    . IR KYPHO THORACIC WITH BONE BIOPSY  03/24/2018  . IR VERTEBROPLASTY CERV/THOR BX INC UNI/BIL INC/INJECT/IMAGING  03/24/2018  . LUMBAR SPINE SURGERY     "did OR on 2 fractures in my lower back"  . LUNG BIOPSY    . ORIF HIP FRACTURE Right 05/23/2015   Procedure: OPEN REDUCTION INTERNAL FIXATION HIP;  Surgeon: Frederik Pear, MD;  Location: [place];  Service: Orthopedics;   Laterality: Right;  Marland Kitchen VARICOSE VEIN SURGERY     vein ligation and stripping "years ago"     OB History    Gravida  4   Para  4   Term  4   Preterm      AB      Living  4     SAB      IAB      Ectopic      Multiple      Live Births              Family History  Problem Relation Age of Onset  . Colon cancer Mother 68  . Congestive Heart Failure Mother   . Heart disease Father   . Alcohol abuse Father   . Heart disease Maternal Grandmother   . Fibromyalgia Son     Social History   Tobacco Use  . Smoking status: Former Smoker    Years: 1.00    Types: Cigarettes  . Smokeless tobacco: Never Used  . Tobacco comment: At age 51 yrs old but nothing since   Vaping Use  . Vaping Use: Never used  Substance Use Topics  . Alcohol use: No  . Drug use: No    Home Medications Prior to Admission medications   Medication Sig Start Date End Date Taking? Authorizing Provider  citalopram (CELEXA) 40 MG tablet Take 60 mg by mouth daily.     [provider]  cyclobenzaprine (FLEXERIL) 10 MG tablet Take 10 mg by mouth every 8 (eight) hours as needed for muscle spasms.    [provider]  dorzolamide (TRUSOPT) 2 % ophthalmic solution Place 1 drop into both eyes 2 (two) times daily.     [provider]  ferrous sulfate 325 (65 FE) MG tablet Take 1 tablet (325 mg total) by mouth 2 (two) times daily with a meal. 01/27/20   Ghimire, Henreitta Leber, MD  gabapentin (NEURONTIN) 300 MG capsule Take 600 mg by mouth at bedtime.    [provider]  latanoprost (XALATAN) 0.005 % ophthalmic solution Place 1 drop into both eyes at bedtime.    [provider]  LORazepam (ATIVAN) 2 MG tablet Take 2 mg by mouth in the morning and at bedtime.     [provider]  Multiple Vitamin (MULTIVITAMIN) tablet Take 1 tablet by mouth at bedtime.     [provider]  oxyCODONE-acetaminophen (PERCOCET) 10-325 MG tablet Take 1 tablet by mouth every  6 (six) hours as needed for pain.    [provider]  pantoprazole (PROTONIX) 40 MG tablet Take 1 tablet (40 mg total) by mouth daily. 11/23/19 03/19/20  ShahmehdiValeria Batman, MD  Probiotic Product (ALIGN) 4 MG CAPS Take 4 mg by mouth at bedtime.     [provider]  traZODone (DESYREL) 50 MG  tablet Take 0.5-1 tablets (25-50 mg total) by mouth at bedtime as needed for sleep. Patient taking differently: Take 50 mg by mouth at bedtime.  10/28/18   [name], MD    Allergies    Minocycline, Paxil [paroxetine hcl], Sulfa antibiotics, Remeron [mirtazapine], Trimox [amoxicillin], Viibryd [vilazodone hcl], Avelox [moxifloxacin hcl in nacl], Biaxin [clarithromycin], Cefuroxime, Levaquin [levofloxacin in d5w], Penicillins, and Prednisone  Review of Systems   Review of Systems  Cardiovascular: Positive for chest pain.  All other systems reviewed and are negative.   Physical Exam Updated Vital Signs BP (!) 113/55 (BP Location: Left Arm)   Pulse 80   Temp 98.1 F (36.7 C) (Oral)   Resp 16   SpO2 91%   Physical Exam Vitals and nursing note reviewed.  Constitutional:      Appearance: She is well-developed.  HENT:     Head: Normocephalic.     Comments: Scalp nontender no signs of injury Eyes:     Pupils: Pupils are equal, round, and reactive to light.  Cardiovascular:     Rate and Rhythm: Normal rate and regular rhythm.     Pulses: Normal pulses.     Comments: Tender right lateral ribs, bruised swollen right elbow no obvious deformity Pulmonary:     Effort: Pulmonary effort is normal.  Abdominal:     General: There is no distension.  Musculoskeletal:        General: Normal range of motion.     Cervical back: Normal range of motion.  Neurological:     Mental Status: She is alert and oriented to person, place, and time.  Psychiatric:        Mood and Affect: Mood normal.     ED Results / Procedures / Treatments   Labs (all labs ordered are listed, but only  abnormal results are displayed) Labs Reviewed - No data to display  EKG None  Radiology DG Ribs Unilateral W/Chest Right  Result Date: 07/24/2020 CLINICAL DATA:  Fall with right-sided pain EXAM: RIGHT RIBS AND CHEST - 3+ VIEW COMPARISON:  03/19/2020 FINDINGS: Fracture with callus anteriorly on the right at the third rib. There may also be a healing lateral right seventh rib fracture. No acute fracture is seen. Multiple remote compression fractures and cement augmentation. No evidence of hemothorax or pneumothorax. IMPRESSION: Remote/healing right rib fractures.  No visible acute fracture. No evidence of acute cardiopulmonary disease. Electronically Signed   By: Monte Fantasia M.D.   On: 07/24/2020 10:43   DG Elbow Complete Right  Result Date: 07/24/2020 CLINICAL DATA:  Pain.  Laceration.  Fall. EXAM: RIGHT ELBOW - COMPLETE 3+ VIEW COMPARISON:  None. FINDINGS: No acute fracture or dislocation. No joint effusion. Suspected soft tissue injury posterior to the distal humerus on the lateral view. IMPRESSION: No acute osseous abnormality. Electronically Signed   By: Abigail Miyamoto M.D.   On: 07/24/2020 10:40   CT Head Wo Contrast  Result Date: 07/24/2020 CLINICAL DATA:  Fall from standing. EXAM: CT HEAD WITHOUT CONTRAST TECHNIQUE: Contiguous axial images were obtained from the base of the skull through the vertex without intravenous contrast. COMPARISON:  November 25, 2019 head CT FINDINGS: Brain: No evidence of acute large vascular territory infarction, hemorrhage, hydrocephalus, extra-axial collection or mass lesion/mass effect. Stable moderate global parenchymal volume loss with ex vacuo dilatation of ventricular system. Similar moderate burden of chronic small vessel white matter ischemic disease. Chronic lacunar type infarct in the right basal ganglia. Vascular: No hyperdense vessel or unexpected calcification. Skull:  Normal. Negative for fracture or focal lesion. Sinuses/Orbits: Paranasal sinuses are  predominantly clear. Prior bilateral lens surgery. Other: Dental hardware. IMPRESSION: 1. No acute intracranial findings. 2. Stable moderate global parenchymal volume loss and chronic small vessel white matter ischemic disease. Electronically Signed   By: Dahlia Bailiff MD   On: 07/24/2020 10:19    Procedures Procedures   Medications Ordered in ED Medications  acetaminophen (TYLENOL) tablet 650 mg (650 mg Oral Given 07/24/20 1022)    ED Course  I have reviewed the triage vital signs and the nursing notes.  Pertinent labs & imaging results that were available during my care of the patient were reviewed by me and considered in my medical decision making (see chart for details).    MDM Rules/Calculators/A&P                          MDM: X-ray of right ribs shows no evidence of new fracture patient does have old fractures to right ribs. Pt given tylenol for pain.  Pt able to ambulate with assistance.   Final Clinical Impression(s) / ED Diagnoses Final diagnoses:  Fall, initial encounter  Contusion of chest, right, initial encounter  Contusion of right elbow, initial encounter    Rx / DC Orders ED Discharge Orders    None    An After Visit Summary was printed and given to the patient.    Fransico Meadow, [place] 07/24/20 1156    Isla Pence, MD 07/24/20 1158

## 2020-07-24 NOTE — ED Notes (Signed)
Called [facility] at [place], per staff this is independent living and no one to given report too. Staff aware that pt would be coming back via [facility]

## 2020-07-24 NOTE — Discharge Instructions (Addendum)
Return if any problems.

## 2020-07-24 NOTE — ED Notes (Signed)
Pt. Ambulated to bathroom with walker. Pt. Stated "it hurts to breath". No complaint of dizziness or SHOB while ambulating.

## 2020-07-24 NOTE — ED Triage Notes (Signed)
Pt arrived to ED via EMS from [place]. Pt was walking with  walker from bathroom to bed when she slipped and fell. Pt hit right ribs on walker. Pt did not want to come to hospital last night, but awoke this am with increased rib pain. Denies head trauma . Bruising and abrasion to right elbow. Pt is alert and oriented  3

## 2020-07-24 NOTE — ED Notes (Signed)
Pt son [name] called, left message on voice mail that pt was leaving with [facility] shortly and would be en route to her apartment

## 2020-07-28 DIAGNOSIS — Z1159 Encounter for screening for other viral diseases: Secondary | ICD-10-CM | POA: Diagnosis not present

## 2020-07-28 DIAGNOSIS — Z20828 Contact with and (suspected) exposure to other viral communicable diseases: Secondary | ICD-10-CM | POA: Diagnosis not present

## 2020-08-04 DIAGNOSIS — Z20828 Contact with and (suspected) exposure to other viral communicable diseases: Secondary | ICD-10-CM | POA: Diagnosis not present

## 2020-08-04 DIAGNOSIS — Z1159 Encounter for screening for other viral diseases: Secondary | ICD-10-CM | POA: Diagnosis not present

## 2020-08-11 DIAGNOSIS — Z20828 Contact with and (suspected) exposure to other viral communicable diseases: Secondary | ICD-10-CM | POA: Diagnosis not present

## 2020-08-11 DIAGNOSIS — Z1159 Encounter for screening for other viral diseases: Secondary | ICD-10-CM | POA: Diagnosis not present

## 2020-08-12 DIAGNOSIS — Z5181 Encounter for therapeutic drug level monitoring: Secondary | ICD-10-CM | POA: Diagnosis not present

## 2020-08-12 DIAGNOSIS — M5030 Other cervical disc degeneration, unspecified cervical region: Secondary | ICD-10-CM | POA: Diagnosis not present

## 2020-08-12 DIAGNOSIS — M5136 Other intervertebral disc degeneration, lumbar region: Secondary | ICD-10-CM | POA: Diagnosis not present

## 2020-08-12 DIAGNOSIS — G8929 Other chronic pain: Secondary | ICD-10-CM | POA: Diagnosis not present

## 2020-08-12 DIAGNOSIS — Z79899 Other long term (current) drug therapy: Secondary | ICD-10-CM | POA: Diagnosis not present

## 2020-08-12 DIAGNOSIS — Z862 Personal history of diseases of the blood and blood-forming organs and certain disorders involving the immune mechanism: Secondary | ICD-10-CM | POA: Diagnosis not present

## 2020-08-12 DIAGNOSIS — Z0000 Encounter for general adult medical examination without abnormal findings: Secondary | ICD-10-CM | POA: Diagnosis not present

## 2020-08-12 DIAGNOSIS — M810 Age-related osteoporosis without current pathological fracture: Secondary | ICD-10-CM | POA: Diagnosis not present

## 2020-08-12 DIAGNOSIS — R7309 Other abnormal glucose: Secondary | ICD-10-CM | POA: Diagnosis not present

## 2020-08-12 DIAGNOSIS — E7800 Pure hypercholesterolemia, unspecified: Secondary | ICD-10-CM | POA: Diagnosis not present

## 2020-08-12 DIAGNOSIS — K219 Gastro-esophageal reflux disease without esophagitis: Secondary | ICD-10-CM | POA: Diagnosis not present

## 2020-08-12 DIAGNOSIS — M159 Polyosteoarthritis, unspecified: Secondary | ICD-10-CM | POA: Diagnosis not present

## 2020-08-12 DIAGNOSIS — F419 Anxiety disorder, unspecified: Secondary | ICD-10-CM | POA: Diagnosis not present

## 2020-08-12 DIAGNOSIS — N183 Chronic kidney disease, stage 3 unspecified: Secondary | ICD-10-CM | POA: Diagnosis not present

## 2020-08-12 DIAGNOSIS — F329 Major depressive disorder, single episode, unspecified: Secondary | ICD-10-CM | POA: Diagnosis not present

## 2020-08-18 DIAGNOSIS — Z20828 Contact with and (suspected) exposure to other viral communicable diseases: Secondary | ICD-10-CM | POA: Diagnosis not present

## 2020-08-18 DIAGNOSIS — Z1159 Encounter for screening for other viral diseases: Secondary | ICD-10-CM | POA: Diagnosis not present

## 2020-08-25 DIAGNOSIS — Z20828 Contact with and (suspected) exposure to other viral communicable diseases: Secondary | ICD-10-CM | POA: Diagnosis not present

## 2020-08-25 DIAGNOSIS — Z1159 Encounter for screening for other viral diseases: Secondary | ICD-10-CM | POA: Diagnosis not present

## 2020-09-01 DIAGNOSIS — Z1159 Encounter for screening for other viral diseases: Secondary | ICD-10-CM | POA: Diagnosis not present

## 2020-09-01 DIAGNOSIS — Z20828 Contact with and (suspected) exposure to other viral communicable diseases: Secondary | ICD-10-CM | POA: Diagnosis not present

## 2020-09-08 DIAGNOSIS — Z20828 Contact with and (suspected) exposure to other viral communicable diseases: Secondary | ICD-10-CM | POA: Diagnosis not present

## 2020-09-08 DIAGNOSIS — Z1159 Encounter for screening for other viral diseases: Secondary | ICD-10-CM | POA: Diagnosis not present

## 2020-09-09 ENCOUNTER — Emergency Department (HOSPITAL_COMMUNITY): Payer: Medicare Other

## 2020-09-09 ENCOUNTER — Observation Stay (HOSPITAL_COMMUNITY)
Admission: EM | Admit: 2020-09-09 | Discharge: 2020-09-11 | Disposition: A | Discharge status: Skilled Nursing Facility | Payer: Medicare Other | Attending: Internal Medicine | Admitting: Internal Medicine

## 2020-09-09 ENCOUNTER — Encounter (HOSPITAL_COMMUNITY): Payer: Self-pay | Admitting: Emergency Medicine

## 2020-09-09 ENCOUNTER — Other Ambulatory Visit: Payer: Self-pay

## 2020-09-09 DIAGNOSIS — E46 Unspecified protein-calorie malnutrition: Secondary | ICD-10-CM | POA: Diagnosis present

## 2020-09-09 DIAGNOSIS — R0602 Shortness of breath: Secondary | ICD-10-CM | POA: Insufficient documentation

## 2020-09-09 DIAGNOSIS — F418 Other specified anxiety disorders: Secondary | ICD-10-CM | POA: Diagnosis not present

## 2020-09-09 DIAGNOSIS — R1313 Dysphagia, pharyngeal phase: Secondary | ICD-10-CM | POA: Diagnosis not present

## 2020-09-09 DIAGNOSIS — J189 Pneumonia, unspecified organism: Secondary | ICD-10-CM | POA: Diagnosis not present

## 2020-09-09 DIAGNOSIS — Z79899 Other long term (current) drug therapy: Secondary | ICD-10-CM | POA: Insufficient documentation

## 2020-09-09 DIAGNOSIS — Z87891 Personal history of nicotine dependence: Secondary | ICD-10-CM | POA: Insufficient documentation

## 2020-09-09 DIAGNOSIS — R9431 Abnormal electrocardiogram [ECG] [EKG]: Secondary | ICD-10-CM | POA: Diagnosis not present

## 2020-09-09 DIAGNOSIS — Z20822 Contact with and (suspected) exposure to covid-19: Secondary | ICD-10-CM | POA: Insufficient documentation

## 2020-09-09 DIAGNOSIS — Z66 Do not resuscitate: Secondary | ICD-10-CM | POA: Diagnosis not present

## 2020-09-09 DIAGNOSIS — R0902 Hypoxemia: Secondary | ICD-10-CM | POA: Diagnosis not present

## 2020-09-09 DIAGNOSIS — M25552 Pain in left hip: Secondary | ICD-10-CM | POA: Diagnosis not present

## 2020-09-09 DIAGNOSIS — E876 Hypokalemia: Secondary | ICD-10-CM | POA: Diagnosis not present

## 2020-09-09 DIAGNOSIS — R519 Headache, unspecified: Secondary | ICD-10-CM | POA: Insufficient documentation

## 2020-09-09 DIAGNOSIS — G894 Chronic pain syndrome: Secondary | ICD-10-CM | POA: Diagnosis not present

## 2020-09-09 DIAGNOSIS — W19XXXA Unspecified fall, initial encounter: Secondary | ICD-10-CM

## 2020-09-09 DIAGNOSIS — G8929 Other chronic pain: Secondary | ICD-10-CM | POA: Diagnosis not present

## 2020-09-09 DIAGNOSIS — H409 Unspecified glaucoma: Secondary | ICD-10-CM | POA: Diagnosis present

## 2020-09-09 DIAGNOSIS — E440 Moderate protein-calorie malnutrition: Secondary | ICD-10-CM | POA: Diagnosis not present

## 2020-09-09 DIAGNOSIS — G9389 Other specified disorders of brain: Secondary | ICD-10-CM | POA: Diagnosis not present

## 2020-09-09 DIAGNOSIS — R000 Tachycardia, unspecified: Secondary | ICD-10-CM | POA: Diagnosis not present

## 2020-09-09 DIAGNOSIS — R531 Weakness: Secondary | ICD-10-CM | POA: Diagnosis not present

## 2020-09-09 LAB — CBC WITH DIFFERENTIAL/PLATELET
Abs Immature Granulocytes: 0.18 10*3/uL — ABNORMAL HIGH (ref 0.00–0.07)
Basophils Absolute: 0 10*3/uL (ref 0.0–0.1)
Basophils Relative: 0 %
Eosinophils Absolute: 0 10*3/uL (ref 0.0–0.5)
Eosinophils Relative: 0 %
HCT: 46.7 % — ABNORMAL HIGH (ref 36.0–46.0)
Hemoglobin: 14.5 g/dL (ref 12.0–15.0)
Immature Granulocytes: 1 %
Lymphocytes Relative: 4 %
Lymphs Abs: 0.9 10*3/uL (ref 0.7–4.0)
MCH: 30.6 pg (ref 26.0–34.0)
MCHC: 31 g/dL (ref 30.0–36.0)
MCV: 98.5 fL (ref 80.0–100.0)
Monocytes Absolute: 1.2 10*3/uL — ABNORMAL HIGH (ref 0.1–1.0)
Monocytes Relative: 5 %
Neutro Abs: 20.5 10*3/uL — ABNORMAL HIGH (ref 1.7–7.7)
Neutrophils Relative %: 90 %
Platelets: 354 10*3/uL (ref 150–400)
RBC: 4.74 MIL/uL (ref 3.87–5.11)
RDW: 11.9 % (ref 11.5–15.5)
WBC: 22.8 10*3/uL — ABNORMAL HIGH (ref 4.0–10.5)
nRBC: 0 % (ref 0.0–0.2)

## 2020-09-09 LAB — LACTIC ACID, PLASMA: Lactic Acid, Venous: 1.8 mmol/L (ref 0.5–1.9)

## 2020-09-09 LAB — BASIC METABOLIC PANEL
Anion gap: 12 (ref 5–15)
BUN: 10 mg/dL (ref 8–23)
CO2: 24 mmol/L (ref 22–32)
Calcium: 10.3 mg/dL (ref 8.9–10.3)
Chloride: 100 mmol/L (ref 98–111)
Creatinine, Ser: 0.91 mg/dL (ref 0.44–1.00)
GFR, Estimated: 58 mL/min — ABNORMAL LOW (ref >60)
Glucose, Bld: 95 mg/dL (ref 70–99)
Potassium: 4.1 mmol/L (ref 3.5–5.1)
Sodium: 136 mmol/L (ref 135–145)

## 2020-09-09 LAB — RESP PANEL BY RT-PCR (FLU A&B, COVID) ARPGX2
Influenza A by PCR: NEGATIVE
Influenza B by PCR: NEGATIVE
SARS Coronavirus 2 by RT PCR: NEGATIVE

## 2020-09-09 LAB — BRAIN NATRIURETIC PEPTIDE: B Natriuretic Peptide: 222.2 pg/mL — ABNORMAL HIGH (ref 0.0–100.0)

## 2020-09-09 LAB — TROPONIN I (HIGH SENSITIVITY): Troponin I (High Sensitivity): 13 ng/L (ref <18)

## 2020-09-09 LAB — PROCALCITONIN: Procalcitonin: 0.44 ng/mL

## 2020-09-09 MED ORDER — ACETAMINOPHEN 500 MG PO TABS
1000.0000 mg | ORAL_TABLET | Freq: Once | ORAL | Status: AC
  Administered 2020-09-09: 1000 mg via ORAL
  Filled 2020-09-09: qty 2

## 2020-09-09 MED ORDER — SODIUM CHLORIDE 0.9 % IV SOLN
2.0000 g | INTRAVENOUS | Status: DC
  Administered 2020-09-10: 2 g via INTRAVENOUS
  Filled 2020-09-09: qty 2
  Filled 2020-09-09 (×2): qty 20

## 2020-09-09 MED ORDER — OXYCODONE-ACETAMINOPHEN 5-325 MG PO TABS
1.0000 | ORAL_TABLET | Freq: Four times a day (QID) | ORAL | Status: DC | PRN
Start: 2020-09-09 — End: 2020-09-11
  Administered 2020-09-10 (×2): 1 via ORAL
  Filled 2020-09-09 (×2): qty 1

## 2020-09-09 MED ORDER — ONDANSETRON HCL 4 MG PO TABS: 4.0000 mg | ORAL_TABLET | Freq: Four times a day (QID) | ORAL | Status: DC | PRN

## 2020-09-09 MED ORDER — ENOXAPARIN SODIUM 30 MG/0.3ML IJ SOSY
30.0000 mg | PREFILLED_SYRINGE | INTRAMUSCULAR | Status: DC
  Administered 2020-09-09 – 2020-09-10 (×2): 30 mg via SUBCUTANEOUS
  Filled 2020-09-09 (×2): qty 0.3

## 2020-09-09 MED ORDER — SODIUM CHLORIDE 0.9 % IV SOLN
1.0000 g | Freq: Once | INTRAVENOUS | Status: AC
  Administered 2020-09-09: 1 g via INTRAVENOUS
  Filled 2020-09-09: qty 10

## 2020-09-09 MED ORDER — LATANOPROST 0.005 % OP SOLN
1.0000 [drp] | Freq: Every day | OPHTHALMIC | Status: DC
  Administered 2020-09-09 – 2020-09-10 (×2): 1 [drp] via OPHTHALMIC
  Filled 2020-09-09: qty 2.5

## 2020-09-09 MED ORDER — ACETAMINOPHEN 325 MG PO TABS
650.0000 mg | ORAL_TABLET | Freq: Four times a day (QID) | ORAL | Status: DC | PRN
  Administered 2020-09-09: 650 mg via ORAL
  Filled 2020-09-09: qty 2

## 2020-09-09 MED ORDER — SODIUM CHLORIDE 0.9 % IV BOLUS
1000.0000 mL | Freq: Once | INTRAVENOUS | Status: AC
  Administered 2020-09-09: 1000 mL via INTRAVENOUS

## 2020-09-09 MED ORDER — LORAZEPAM 1 MG PO TABS
2.0000 mg | ORAL_TABLET | Freq: Three times a day (TID) | ORAL | Status: DC
  Administered 2020-09-09 – 2020-09-11 (×7): 2 mg via ORAL
  Filled 2020-09-09 (×7): qty 2

## 2020-09-09 MED ORDER — OXYCODONE-ACETAMINOPHEN 10-325 MG PO TABS: 1.0000 | ORAL_TABLET | Freq: Four times a day (QID) | ORAL | Status: DC | PRN

## 2020-09-09 MED ORDER — CYCLOBENZAPRINE HCL 10 MG PO TABS: 10.0000 mg | ORAL_TABLET | Freq: Three times a day (TID) | ORAL | Status: DC | PRN

## 2020-09-09 MED ORDER — LINACLOTIDE 72 MCG PO CAPS
72.0000 ug | ORAL_CAPSULE | Freq: Every morning | ORAL | Status: DC
  Filled 2020-09-09: qty 1

## 2020-09-09 MED ORDER — OXYCODONE HCL 5 MG PO TABS
5.0000 mg | ORAL_TABLET | Freq: Four times a day (QID) | ORAL | Status: DC | PRN
  Administered 2020-09-11: 5 mg via ORAL
  Filled 2020-09-09: qty 1

## 2020-09-09 MED ORDER — ALBUTEROL SULFATE (2.5 MG/3ML) 0.083% IN NEBU: 2.5000 mg | INHALATION_SOLUTION | RESPIRATORY_TRACT | Status: DC | PRN

## 2020-09-09 MED ORDER — PANTOPRAZOLE SODIUM 40 MG PO TBEC
40.0000 mg | DELAYED_RELEASE_TABLET | Freq: Every day | ORAL | Status: DC
  Administered 2020-09-09 – 2020-09-11 (×3): 40 mg via ORAL
  Filled 2020-09-09 (×3): qty 1

## 2020-09-09 MED ORDER — ALIGN 4 MG PO CAPS: 4.0000 mg | ORAL_CAPSULE | Freq: Every day | ORAL | Status: DC

## 2020-09-09 MED ORDER — BISACODYL 5 MG PO TBEC: 5.0000 mg | DELAYED_RELEASE_TABLET | Freq: Every day | ORAL | Status: DC | PRN

## 2020-09-09 MED ORDER — DORZOLAMIDE HCL 2 % OP SOLN
1.0000 [drp] | Freq: Two times a day (BID) | OPHTHALMIC | Status: DC
  Administered 2020-09-09 – 2020-09-10 (×3): 1 [drp] via OPHTHALMIC
  Filled 2020-09-09 (×2): qty 10

## 2020-09-09 MED ORDER — LORAZEPAM 1 MG PO TABS: 2.0000 mg | ORAL_TABLET | Freq: Three times a day (TID) | ORAL | Status: DC

## 2020-09-09 MED ORDER — SODIUM CHLORIDE 0.9 % IV SOLN: INTRAVENOUS | Status: DC

## 2020-09-09 MED ORDER — SODIUM CHLORIDE 0.9 % IV SOLN
500.0000 mg | Freq: Once | INTRAVENOUS | Status: AC
  Administered 2020-09-09: 500 mg via INTRAVENOUS
  Filled 2020-09-09: qty 500

## 2020-09-09 MED ORDER — ENOXAPARIN SODIUM 40 MG/0.4ML IJ SOSY: 40.0000 mg | PREFILLED_SYRINGE | INTRAMUSCULAR | Status: DC

## 2020-09-09 MED ORDER — TRAZODONE HCL 50 MG PO TABS
50.0000 mg | ORAL_TABLET | Freq: Every day | ORAL | Status: DC
  Administered 2020-09-09 – 2020-09-10 (×2): 50 mg via ORAL
  Filled 2020-09-09 (×2): qty 1

## 2020-09-09 MED ORDER — GABAPENTIN 300 MG PO CAPS
600.0000 mg | ORAL_CAPSULE | Freq: Every day | ORAL | Status: DC
  Administered 2020-09-09 – 2020-09-10 (×2): 600 mg via ORAL
  Filled 2020-09-09 (×2): qty 2

## 2020-09-09 MED ORDER — MORPHINE SULFATE (PF) 2 MG/ML IV SOLN
2.0000 mg | INTRAVENOUS | Status: DC | PRN
Start: 2020-09-09 — End: 2020-09-11
  Administered 2020-09-09 (×2): 2 mg via INTRAVENOUS
  Filled 2020-09-09 (×2): qty 1

## 2020-09-09 MED ORDER — CITALOPRAM HYDROBROMIDE 20 MG PO TABS
20.0000 mg | ORAL_TABLET | Freq: Every day | ORAL | Status: DC
  Administered 2020-09-09 – 2020-09-10 (×2): 20 mg via ORAL
  Filled 2020-09-09 (×2): qty 1
  Filled 2020-09-09: qty 2

## 2020-09-09 MED ORDER — HYDRALAZINE HCL 20 MG/ML IJ SOLN: 5.0000 mg | INTRAMUSCULAR | Status: DC | PRN

## 2020-09-09 MED ORDER — ONDANSETRON HCL 4 MG/2ML IJ SOLN: 4.0000 mg | Freq: Four times a day (QID) | INTRAMUSCULAR | Status: DC | PRN

## 2020-09-09 MED ORDER — GUAIFENESIN ER 600 MG PO TB12: 600.0000 mg | ORAL_TABLET | Freq: Two times a day (BID) | ORAL | Status: DC | PRN

## 2020-09-09 MED ORDER — POLYETHYLENE GLYCOL 3350 17 G PO PACK: 17.0000 g | PACK | Freq: Every day | ORAL | Status: DC | PRN

## 2020-09-09 MED ORDER — SODIUM CHLORIDE 0.9 % IV SOLN
500.0000 mg | INTRAVENOUS | Status: DC
  Administered 2020-09-10: 500 mg via INTRAVENOUS
  Filled 2020-09-09 (×2): qty 500

## 2020-09-09 MED ORDER — ACETAMINOPHEN 650 MG RE SUPP: 650.0000 mg | Freq: Four times a day (QID) | RECTAL | Status: DC | PRN

## 2020-09-09 MED ORDER — DOCUSATE SODIUM 100 MG PO CAPS
100.0000 mg | ORAL_CAPSULE | Freq: Two times a day (BID) | ORAL | Status: DC
  Administered 2020-09-09 – 2020-09-10 (×4): 100 mg via ORAL
  Filled 2020-09-09 (×5): qty 1

## 2020-09-09 MED ORDER — SACCHAROMYCES BOULARDII 250 MG PO CAPS
250.0000 mg | ORAL_CAPSULE | Freq: Two times a day (BID) | ORAL | Status: DC
  Administered 2020-09-09 – 2020-09-10 (×4): 250 mg via ORAL
  Filled 2020-09-09 (×6): qty 1

## 2020-09-09 NOTE — Evaluation (Signed)
Occupational Therapy Evaluation Patient Details Name: Katrina Burnett MRN: [id number] DOB: [birth year] Today's Date: 09/09/2020    History of Present Illness HPI: Katrina Burnett is a 87 y.o. female PMHx: of CVA; PMR; depression/anxiety; and chronic pain presenting with fall.  She reports that she felt mildly SOB during the day yesterday and slid off of commode due to weakness. Pt found to have RLL PNA. CT head negative  and Hip CT negative.   Clinical Impression   Pt PTA: Pt living at ALF ([facility]) and wants to return there. Pt unable to specify what her HHA assists with. Pt currently, pt limited by decreased strength, decreased activity tolerance and decreased ability to care for self. Pt very lethargic after ativan medication given. Pt minA overall for bed mobility, transfers to [facility] with RW and for ADL at EOB. Pt would greatly benefit from continued OT skilled services. OT following acutely. VSS. HR 91 BPM with exertion; O2 on 2-3 L >90% with exertion.     Follow Up Recommendations  Home health OT (d/c back to ALF if able to assist with power up to stand)    Equipment Recommendations  None recommended by OT    Recommendations for Other Services       Precautions / Restrictions Precautions Precautions: Fall Restrictions Weight Bearing Restrictions: No      Mobility Bed Mobility Overal bed mobility: Needs Assistance Bed Mobility: Rolling;Sidelying to Sit;Supine to Sit Rolling: Min assist Sidelying to sit: Mod assist Supine to sit: Supervision     General bed mobility comments: Pt assisted with trunk elevation and hand placement to rails to assist self to scoot to EOB; pt nearly fell backwards into bed for sit to supine due to fatigue.    Transfers Overall transfer level: Needs assistance Equipment used: Rolling walker (2 wheeled) Transfers: Sit to/from [facility] Sit to Stand: Min assist Stand pivot transfers: Min assist       General transfer comment:  MinA for power up and for safety with RW for stability    Balance Overall balance assessment: Needs assistance Sitting-balance support: Bilateral upper extremity supported;Feet supported Sitting balance-Leahy Scale: Fair     Standing balance support: Bilateral upper extremity supported Standing balance-Leahy Scale: Poor Standing balance comment: reliant on RW                           ADL either performed or assessed with clinical judgement   ADL Overall ADL's : Needs assistance/impaired Eating/Feeding: Set up;Sitting   Grooming: Minimal assistance;Sitting   Upper Body Bathing: Minimal assistance;Sitting   Lower Body Bathing: Minimal assistance;Sitting/lateral leans;Sit to/from stand;Cueing for safety   Upper Body Dressing : Minimal assistance;Sitting   Lower Body Dressing: Minimal assistance;Cueing for safety;Sitting/lateral leans;Sit to/from stand   Toilet Transfer: Minimal Systems analyst Details (indicate cue type and reason): pt took a few steps to commode Toileting- Clothing Manipulation and Hygiene: Minimal assistance;Sitting/lateral lean;Sit to/from stand Toileting - Clothing Manipulation Details (indicate cue type and reason): pt performing pericare in sitting; OTR assisting with pericare with wet washcloth     Functional mobility during ADLs: Min guard;Rolling walker General ADL Comments: Pt limited by decreased strength, decreased activity tolerance and decreased ability to care for self. Pt very lethargic after ativan medication given.     Vision Baseline Vision/History: No visual deficits Patient Visual Report: No change from baseline Vision Assessment?: No apparent visual deficits     Perception  Praxis      Pertinent Vitals/Pain Pain Assessment: 0-10 Pain Score: 4  Pain Location: generalized Pain Descriptors / Indicators: Discomfort Pain Intervention(s): Repositioned;Monitored during session     Hand  Dominance Right   Extremity/Trunk Assessment Upper Extremity Assessment Upper Extremity Assessment: Generalized weakness;RUE deficits/detail;LUE deficits/detail RUE Deficits / Details: 3/5 strength LUE Deficits / Details: 3/5 strength   Lower Extremity Assessment Lower Extremity Assessment: Generalized weakness   Cervical / Trunk Assessment Cervical / Trunk Assessment: Kyphotic   Communication Communication Communication: No difficulties   Cognition Arousal/Alertness: Awake/alert Behavior During Therapy: WFL for tasks assessed/performed Overall Cognitive Status: Difficult to assess                                 General Comments: Pt without family or friends in room and just had taken ativan so pt was sleepy   General Comments  VSS. HR 91 BPM with exertion; O2 on 2-3 L >90% with exertion.    Exercises     Shoulder Instructions      Home Living Family/patient expects to be discharged to:: Other (Comment)                                 Additional Comments: [facility]      Prior Functioning/Environment Level of Independence: Independent with assistive device(s)        Comments: reports using a walker, having help bathing 3 days per week and being active with therapy at ALF        OT Problem List: Decreased strength;Decreased activity tolerance;Impaired balance (sitting and/or standing);Decreased cognition;Decreased safety awareness;Cardiopulmonary status limiting activity;Pain      OT Treatment/Interventions: Self-care/ADL training;Therapeutic exercise;Energy conservation;DME and/or AE instruction;Therapeutic activities;Cognitive remediation/compensation;Patient/family education;Balance training    OT Goals(Current goals can be found in the care plan section) Acute Rehab OT Goals Patient Stated Goal: to sleep OT Goal Formulation: With patient Time For Goal Achievement: 09/23/20 Potential to Achieve Goals: Good ADL Goals Pt Will  Perform Grooming: with supervision;standing Pt Will Transfer to Toilet: with min guard assist;ambulating;bedside commode Additional ADL Goal #1: Pt will increase to supervisionA for bed mobility as precursor for ADL Additional ADL Goal #2: Pt will stand x5 mins at sink with supervisionA with O2 >90% with exertion.  OT Frequency: Min 2X/week   Barriers to D/C:            Co-evaluation              AM-PAC OT "6 Clicks" Daily Activity     Outcome Measure Help from another person eating meals?: A Little Help from another person taking care of personal grooming?: A Little Help from another person toileting, which includes using toliet, bedpan, or urinal?: A Little Help from another person bathing (including washing, rinsing, drying)?: A Little Help from another person to put on and taking off regular upper body clothing?: A Little Help from another person to put on and taking off regular lower body clothing?: A Little 6 Click Score: 18   End of Session Equipment Utilized During Treatment: Gait belt;Rolling walker;Oxygen Nurse Communication: Mobility status  Activity Tolerance: Patient tolerated treatment well;Patient limited by lethargy Patient left: in bed;with call bell/phone within reach;with bed alarm set  OT Visit Diagnosis: Unsteadiness on feet (R26.81);Muscle weakness (generalized) (M62.81)  Time: 4401-0272 OT Time Calculation (min): 24 min Charges:  OT General Charges $OT Visit: 1 Visit OT Evaluation $OT Eval Moderate Complexity: 1 Mod OT Treatments $Self Care/Home Management : 8-22 mins  Jefferey Pica, OTR/L Acute Rehabilitation Services Pager: [phone] Office: [place] 09/09/2020, 6:05 PM

## 2020-09-09 NOTE — ED Notes (Signed)
Patient transported to X-ray 

## 2020-09-09 NOTE — ED Notes (Signed)
Pt given meal tray. Pt eating lunch at this time.

## 2020-09-09 NOTE — ED Notes (Signed)
Son [name], [phone] asking to be called when pt moves to floor.

## 2020-09-09 NOTE — Progress Notes (Signed)
[facility]) Hospital Liaison note:  This is a pending outpatient-based Palliative Care patient. Will continue to follow for disposition.  Please call with any outpatient palliative questions or concerns.  Thank you, Lorelee Market, LPN [facility] Liaison [phone]

## 2020-09-09 NOTE — ED Triage Notes (Signed)
Pt BIB [facility] from [facility] c/o unwitnessed fall this AM. Pt was on the toilet, waited for help, decided to get up on her own without assistance, fell onto tile floor. Denies LOC. Pt does not take blood thinners. Pt endorses weakness since waking up this AM. Pt c/o generalized L sided pain and HA. Pt did not take morning meds.   EMS VS- HR 90, BP 150/90, SpO2 92% 2L via [place].

## 2020-09-09 NOTE — H&P (Signed)
History and Physical    Katrina Burnett:[id number] DOB: [birth year] DOA: 09/09/2020  PCP: [name], DO Consultants:  Mayer Camel - orthopedics Patient coming from: [place]; NOK: Daughter, Katrina Burnett, [phone]; SIL, Katrina Burnett, [phone]  Chief Complaint: Fall  HPI: Katrina Burnett is a 87 y.o. female with medical history significant of CVA; PMR; depression/anxiety; and chronic pain presenting with fall.  She reports that she felt mildly SOB during the day yesterday.  She got up to use the bathroom overnight and was too weak to lift herself off the commode and so she eventually slid to the floor.  She denies cough.  No known sick contacts.    ED Course:  [place] off the toilet with L-sided pain.  Hypoxic in the ER, not on home O2, denies SOB.  88-90% on [place] O2.  CXR with RLL PNA.  Review of Systems: As per HPI; otherwise review of systems reviewed and negative.   Ambulatory Status:  Ambulates with a walker  COVID Vaccine Status:  Complete  Past Medical History:  Diagnosis Date  . Allergic rhinitis   . Anxiety   . Arthritis    "hands" (11/25/2016)  . Barrett's esophagus 06/1997  . CAP (community acquired pneumonia) 11/25/2016   "this is my 3rd time having pneumonia" (11/25/2016)  . Chondromalacia   . Chronic lower back pain   . Depression   . Diverticulosis   . GERD (gastroesophageal reflux disease)   . Glaucoma   . History of hiatal hernia   . History of stomach ulcers   . Lumbar compression fracture (HCC)    L1 and L2  . Osteoporosis   . Pneumonia   . Polymyalgia ([place])   . Stroke ([place])   . Tubular adenoma of colon 06/1999  . Vaginitis   . Varicose veins     Past Surgical History:  Procedure Laterality Date  . BACK SURGERY    . BIOPSY  11/19/2019   Procedure: BIOPSY;  Surgeon: Gatha Mayer, MD;  Location: [facility];  Service: Endoscopy;;  . CATARACT EXTRACTION W/ INTRAOCULAR LENS  IMPLANT, BILATERAL Bilateral   .  ESOPHAGOGASTRODUODENOSCOPY (EGD) WITH PROPOFOL N/A 11/19/2019   Procedure: ESOPHAGOGASTRODUODENOSCOPY (EGD) WITH PROPOFOL;  Surgeon: Gatha Mayer, MD;  Location: [facility];  Service: Endoscopy;  Laterality: N/A;  . FIXATION KYPHOPLASTY LUMBAR SPINE  10/12/10; 09/20/11  . FRACTURE SURGERY    . IR KYPHO THORACIC WITH BONE BIOPSY  03/24/2018  . IR VERTEBROPLASTY CERV/THOR BX INC UNI/BIL INC/INJECT/IMAGING  03/24/2018  . LUMBAR SPINE SURGERY     "did OR on 2 fractures in my lower back"  . LUNG BIOPSY    . ORIF HIP FRACTURE Right 05/23/2015   Procedure: OPEN REDUCTION INTERNAL FIXATION HIP;  Surgeon: Frederik Pear, MD;  Location: [facility];  Service: Orthopedics;  Laterality: Right;  Marland Kitchen VARICOSE VEIN SURGERY     vein ligation and stripping "years ago"    Social History   Socioeconomic History  . Marital status: Widowed    Spouse name: Not on file  . Number of children: 4  . Years of education: Not on file  . Highest education level: Not on file  Occupational History  . Occupation: Retired  Tobacco Use  . Smoking status: Former Smoker    Years: 1.00    Types: Cigarettes  . Smokeless tobacco: Never Used  . Tobacco comment: At age 61 yrs old but nothing since   Vaping Use  . Vaping Use: Never used  Substance and Sexual Activity  . Alcohol use: No  . Drug use: No  . Sexual activity: Never    Birth control/protection: Post-menopausal  Other Topics Concern  . Not on file  Social History Narrative   Daily caffeine    Social Determinants of Health   Financial Resource Strain: Not on file  Food Insecurity: Not on file  Transportation Needs: Not on file  Physical Activity: Not on file  Stress: Not on file  Social Connections: Not on file  Intimate Partner Violence: Not on file    Allergies  Allergen Reactions  . Minocycline Nausea And Vomiting  . Paxil [Paroxetine Hcl] Nausea And Vomiting  . Sulfa Antibiotics Nausea And Vomiting  . Amoxicillin Other (See Comments)    Doesn't  remember  Has patient had a PCN reaction causing immediate rash, facial/tongue/throat swelling, SOB or lightheadedness with hypotension: Unknown Has patient had a PCN reaction causing severe rash involving mucus membranes or skin necrosis: Unknown Has patient had a PCN reaction that required hospitalization: Unknown Has patient had a PCN reaction occurring within the last 10 years: Unknown If all of the above answers are "NO", then may proceed with Cephalosporin use. Other reaction(s): sore mouth  . Bupropion Hcl Er (Sr)     Other reaction(s): fatigue  . Cyclobenzaprine     Other reaction(s): dry mouth  . Mirtazapine Other (See Comments)    Doesn't remember  Other reaction(s): jittery  . Vilazodone Hcl Other (See Comments)    Doesn't remember  Other reaction(s): dizziness  . Avelox [Moxifloxacin Hcl In Nacl] Other (See Comments)    Felt bad  . Cefuroxime Diarrhea    Other reaction(s): severe diarrhea  . Clarithromycin Nausea Only    Other reaction(s): stomach pain  . Levaquin [Levofloxacin In D5w] Diarrhea  . Nystatin-Triamcinolone Rash  . Penicillins Rash  . Prednisone Nausea Only    Other reaction(s): sick    Family History  Problem Relation Age of Onset  . Colon cancer Mother 66  . Congestive Heart Failure Mother   . Heart disease Father   . Alcohol abuse Father   . Heart disease Maternal Grandmother   . Fibromyalgia Son     Prior to Admission medications   Medication Sig Start Date End Date Taking? Authorizing Provider  citalopram (CELEXA) 40 MG tablet Take 60 mg by mouth daily.     [provider]  cyclobenzaprine (FLEXERIL) 10 MG tablet Take 10 mg by mouth every 8 (eight) hours as needed for muscle spasms.    [provider]  dorzolamide (TRUSOPT) 2 % ophthalmic solution Place 1 drop into both eyes 2 (two) times daily.     [provider]  ferrous sulfate 325 (65 FE) MG tablet Take 1 tablet (325 mg total) by mouth 2 (two) times daily with  a meal. 01/27/20   Ghimire, Henreitta Leber, MD  gabapentin (NEURONTIN) 300 MG capsule Take 600 mg by mouth at bedtime.    [provider]  latanoprost (XALATAN) 0.005 % ophthalmic solution Place 1 drop into both eyes at bedtime.    [provider]  LORazepam (ATIVAN) 2 MG tablet Take 2 mg by mouth in the morning and at bedtime.     [provider]  Multiple Vitamin (MULTIVITAMIN) tablet Take 1 tablet by mouth at bedtime.     [provider]  oxyCODONE-acetaminophen (PERCOCET) 10-325 MG tablet Take 1 tablet by mouth every 6 (six) hours as needed for pain.    [provider]  pantoprazole (PROTONIX) 40 MG tablet Take 1 tablet (40 mg total) by mouth daily. 11/23/19 03/19/20  ShahmehdiValeria Batman, MD  Probiotic Product (ALIGN) 4 MG CAPS Take 4 mg by mouth at bedtime.     [provider]  traZODone (DESYREL) 50 MG tablet Take 0.5-1 tablets (25-50 mg total) by mouth at bedtime as needed for sleep. Patient taking differently: Take 50 mg by mouth at bedtime.  10/28/18   [name], MD    Physical Exam: Vitals:   09/09/20 [id number] 09/09/20 0755 09/09/20 0800 09/09/20 0850  BP: 139/86  133/90 131/74  Pulse: 97  95 87  Resp: 17  (!) 21 13  Temp: 99 F (37.2 C)     TempSrc: Oral     SpO2: 92%  93% 94%  Weight:  41.7 kg    Height:  5' (1.524 m)       . General:  Appears calm and comfortable and is in NAD; she is frail and cachectic . Eyes:  PERRL, EOMI, normal lids, iris . ENT:  grossly normal hearing, lips & tongue, mmm; appropriate dentition . Neck:  no LAD, masses or thyromegaly . Cardiovascular:  RRR, no m/r/g. No LE edema.  Marland Kitchen Respiratory:   RLL rhonchi with diminished breath sounds.  Normal to mildly increased respiratory effort. . Abdomen:  soft, NT, ND . Skin:  no rash or induration seen on limited exam . Musculoskeletal:  grossly normal tone BUE/BLE, good ROM, no bony abnormality . Psychiatric:  grossly normal mood and affect, speech fluent and  appropriate, AOx3 . Neurologic:  CN 2-12 grossly intact, moves all extremities in coordinated fashion    Radiological Exams on Admission: Independently reviewed - see discussion in A/P where applicable  DG Chest 1 View  Result Date: 09/09/2020 CLINICAL DATA:  Recent fall with left hip pain, initial encounter EXAM: CHEST  1 VIEW COMPARISON:  07/24/2020 FINDINGS: Cardiac shadow is stable. Aortic calcifications are again seen. Changes of prior vertebral augmentation are noted at multiple levels. Increased right basilar airspace opacity is noted consistent with acute infiltrate. No effusion or pneumothorax is seen. No other focal abnormality is noted. IMPRESSION: New right basilar infiltrate. Electronically Signed   By: Inez Catalina M.D.   On: 09/09/2020 08:34   CT Head Wo Contrast  Result Date: 09/09/2020 CLINICAL DATA:  Unwitnessed fall this morning. Weakness since awakening this morning. EXAM: CT HEAD WITHOUT CONTRAST TECHNIQUE: Contiguous axial images were obtained from the base of the skull through the vertex without intravenous contrast. COMPARISON:  07/24/2020. FINDINGS: Brain: No evidence of acute infarction, hemorrhage, hydrocephalus, extra-axial collection or mass lesion/mass effect. Age related ventricular sulcal enlargement, old right basal ganglia lacunar infarct and patchy white matter hypoattenuation consistent with microvascular ischemic change, all stable. Vascular: No hyperdense vessel or unexpected calcification. Skull: Normal. Negative for fracture or focal lesion. Sinuses/Orbits: Globes and orbits are unremarkable. Sinuses are clear. Other: None. IMPRESSION: No acute intracranial abnormalities. Stable appearance from the prior exam. Electronically Signed   By: Lajean Manes M.D.   On: 09/09/2020 08:45   DG Hip Unilat W or Wo Pelvis 2-3 Views Left  Result Date: 09/09/2020 CLINICAL DATA:  Recent fall with left hip pain, initial encounter EXAM: DG HIP (WITH OR WITHOUT PELVIS) 2-3V LEFT  COMPARISON:  03/19/2020 FINDINGS: Postsurgical changes are again seen in the proximal right femur. No acute abnormality is noted. Prior healed pubic rami fractures on the right are again noted. Right iliac bone fracture superior to the  acetabulum is again noted and stable. Remainder of the pelvic ring appears intact. Proximal left femur shows no acute abnormality. IMPRESSION: Chronic changes on the right. No acute left hip fracture is noted. Electronically Signed   By: Inez Catalina M.D.   On: 09/09/2020 08:33    EKG: Independently reviewed.  NSR with rate 93; prolonged QTc 503; nonspecific ST changes with no evidence of acute ischemia   Labs on Admission: I have personally reviewed the available labs and imaging studies at the time of the admission.  Pertinent labs:   GFR 58 HS troponin 13 WBC 22.8 COVID/flu negative   Assessment/Plan Principal Problem:   CAP (community acquired pneumonia) Active Problems:   Depression with anxiety   Malnutrition ([place])   Chronic pain disorder   Glaucoma   DNR (do not resuscitate)   CAP -Patient presenting with weakness, SOB, mildly decreased oxygen saturation, and infiltrate in right lower lobe on chest x-ray -This appears to be most likely community-acquired pneumonia.  -Other etiologies include aspiration versus URI (Influenza/COVID negative). -Gram stain, sputum cultures, blood cultures, strep antigen testing ordered. -Will order lower respiratory tract procalcitonin level.   >0.5 indicates infection and >>0.5 indicates more serious disease.  As the procalcitonin level normalizes, it will be reasonable to consider de-escalation of antibiotic coverage.  The sensitivity of procalcitonin is variable and should not be used alone to guide treatment. -Negative lactate, no current concerns for sepsis. -CURB-65 score is 1 - will admit the patient to Med Tele. -Pneumonia Severity Index (PSI) is Class 4, 9% mortality. -Will start Azithromycin 500 mg IV  daily and Rocephin due to no risk factors for MDR cause. -NS @ 75cc/hr -Fever control -Repeat CBC in am -Will add albuterol PRN  Malnutrition/Frailty -Body mass index is 17.97 kg/m..  -The patient has at least 2 indicators for malnutrition (insufficient energy intake, weight loss, loss of muscle mass, loss of subcutaneous fat -This is likely due to starvation related -Will obtain a nutrition and ST consult for further recommendations. -PT/OT consults also requested  Chronic pain -I have reviewed this patient in the [place] Controlled Substances Reporting System.  She is receiving medications from only one provider and appears to be taking them as prescribed. -She is at very high risk of opioid misuse, diversion, or overdose. -Will continue home meds and add prn morphine but would be cautious about further escalation of medications given her overall medical situation. -Continue Percocet, Flexeril, Neurontin  Depression/anxiety -Continue Celexa, Ativan, Trazodone  Glaucoma -Continue Trusopt, Xalatan  DNR -I have discussed code status with the patient and her granddaughter and  they are in agreement that the patient would not desire resuscitation and would prefer to die a natural death should that situation arise. -She will need a gold out of facility DNR form at the time of discharge    Note: This patient has been tested and is negative for the novel coronavirus COVID-19. She has been fully vaccinated against COVID-19.    DVT prophylaxis: Lovenox  Code Status:  DNR - confirmed with patient Family Communication: None present; I spoke with her daughter and SIL at the time of admission Disposition Plan:  The patient is from: home  Anticipated d/c is to: home without [facility] services   Anticipated d/c date will depend on clinical response to treatment, likely 2-3 days  Patient is currently: acutely ill Consults called:  PT/OT/ST/Nutrition Admission status: Admit - It is my clinical  opinion that admission to INPATIENT is reasonable and necessary because  of the expectation that this patient will require hospital care that crosses at least 2 midnights to treat this condition based on the medical complexity of the problems presented.  Given the aforementioned information, the predictability of an adverse outcome is felt to be significant.   Karmen Bongo MD [facility]   How to contact the [facility] Attending or Consulting provider [place] or covering provider during after hours [place], for this patient?  1. Check the care team in [facility] and look for a) attending/consulting TR[facility] provider listed and b) the [facility] team listed 2. Log into [email or web address] and use [place]'s universal password to access. If you do not have the password, please contact the hospital operator. 3. Locate the [facility] provider you are looking for under [facility] and page to a number that you can be directly reached. 4. If you still have difficulty reaching the provider, please page the [facility] (Director on Call) for the Hospitalists listed on amion for assistance.   09/09/2020, 11:16 AM

## 2020-09-09 NOTE — ED Provider Notes (Signed)
[facility] EMERGENCY DEPARTMENT Provider Note   CSN: [id number] Arrival date & time: 09/09/20  0746     History Chief Complaint  Patient presents with  . Fall    Katrina Burnett is a 87 y.o. female.  87 yo F with a chief complaints of sliding off of the toilet.  This occurred this evening.  She denies any significant injury but is complaining of left hip pain and left-sided headache.  She denies any chest pain or trouble breathing.  Denies cough congestion or fever.  Denies abdominal pain nausea or vomiting.  Denies back pain.  Denies abdominal pain.    The history is provided by the patient and the EMS personnel.  Fall This is a new problem. The current episode started 6 to 12 hours ago. The problem occurs constantly. The problem has been resolved. Associated symptoms include headaches. Pertinent negatives include no chest pain and no shortness of breath. Nothing aggravates the symptoms. Nothing relieves the symptoms. She has tried nothing for the symptoms. The treatment provided no relief.       Past Medical History:  Diagnosis Date  . Allergic rhinitis   . Anxiety   . Arthritis    "hands" (11/25/2016)  . Barrett's esophagus 06/1997  . CAP (community acquired pneumonia) 11/25/2016   "this is my 3rd time having pneumonia" (11/25/2016)  . Chondromalacia   . Chronic lower back pain   . Depression   . Diverticulosis   . GERD (gastroesophageal reflux disease)   . Glaucoma   . History of hiatal hernia   . History of stomach ulcers   . Lumbar compression fracture (HCC)    L1 and L2  . Osteoporosis   . Pneumonia   . Polymyalgia ([place])   . Stroke ([place])   . Tubular adenoma of colon 06/1999  . Vaginitis   . Varicose veins     Patient Active Problem List   Diagnosis Date Noted  . Acute respiratory failure with hypoxia ([place]) 01/24/2020  . Acute hypoxemic respiratory failure ([place]) 01/23/2020  . Depression   . Thrombocytosis   . GIB (gastrointestinal bleeding)  11/21/2019  . Esophageal mass 11/19/2019  . Hiatal hernia   . Esophagitis   . Pelvic fracture ([place]) 11/11/2019  . Pneumothorax on left 10/24/2018  . Pneumothorax, left 10/24/2018  . Acute on chronic anemia 11/25/2016  . Hyponatremia 11/25/2016  . Fracture, intertrochanteric, right femur ([place]) 05/26/2015  . Closed fracture of femur ([place])   . Acute blood loss anemia   . Adjustment disorder with mixed anxiety and depressed mood   . Fall   . Abnormality of gait   . Femur fracture, right, closed, initial encounter 05/23/2015  . Fracture of femoral neck, right, closed ([place]) 05/22/2015  . Femoral neck fracture, right, closed, initial encounter 05/22/2015  . Syncope 05/30/2012  . Nausea & vomiting 05/30/2012  . Chest pain 05/30/2012  . Hypotension 05/30/2012  . GERD (gastroesophageal reflux disease) 12/16/2011  . Lumbar compression fracture ([place])   . Polymyalgia ([place])     Past Surgical History:  Procedure Laterality Date  . BACK SURGERY    . BIOPSY  11/19/2019   Procedure: BIOPSY;  Surgeon: Gatha Mayer, MD;  Location: [facility];  Service: Endoscopy;;  . CATARACT EXTRACTION W/ INTRAOCULAR LENS  IMPLANT, BILATERAL Bilateral   . ESOPHAGOGASTRODUODENOSCOPY (EGD) WITH PROPOFOL N/A 11/19/2019   Procedure: ESOPHAGOGASTRODUODENOSCOPY (EGD) WITH PROPOFOL;  Surgeon: Gatha Mayer, MD;  Location: [facility];  Service: Endoscopy;  Laterality: N/A;  . FIXATION KYPHOPLASTY LUMBAR SPINE  10/12/10; 09/20/11  . FRACTURE SURGERY    . IR KYPHO THORACIC WITH BONE BIOPSY  03/24/2018  . IR VERTEBROPLASTY CERV/THOR BX INC UNI/BIL INC/INJECT/IMAGING  03/24/2018  . LUMBAR SPINE SURGERY     "did OR on 2 fractures in my lower back"  . LUNG BIOPSY    . ORIF HIP FRACTURE Right 05/23/2015   Procedure: OPEN REDUCTION INTERNAL FIXATION HIP;  Surgeon: Frederik Pear, MD;  Location: [facility];  Service: Orthopedics;  Laterality: Right;  Marland Kitchen VARICOSE VEIN SURGERY     vein ligation and stripping "years ago"     OB  History    Gravida  4   Para  4   Term  4   Preterm      AB      Living  4     SAB      IAB      Ectopic      Multiple      Live Births              Family History  Problem Relation Age of Onset  . Colon cancer Mother 44  . Congestive Heart Failure Mother   . Heart disease Father   . Alcohol abuse Father   . Heart disease Maternal Grandmother   . Fibromyalgia Son     Social History   Tobacco Use  . Smoking status: Former Smoker    Years: 1.00    Types: Cigarettes  . Smokeless tobacco: Never Used  . Tobacco comment: At age 89 yrs old but nothing since   Vaping Use  . Vaping Use: Never used  Substance Use Topics  . Alcohol use: No  . Drug use: No    Home Medications Prior to Admission medications   Medication Sig Start Date End Date Taking? Authorizing Provider  citalopram (CELEXA) 40 MG tablet Take 60 mg by mouth daily.     [provider]  cyclobenzaprine (FLEXERIL) 10 MG tablet Take 10 mg by mouth every 8 (eight) hours as needed for muscle spasms.    [provider]  dorzolamide (TRUSOPT) 2 % ophthalmic solution Place 1 drop into both eyes 2 (two) times daily.     [provider]  ferrous sulfate 325 (65 FE) MG tablet Take 1 tablet (325 mg total) by mouth 2 (two) times daily with a meal. 01/27/20   Ghimire, Henreitta Leber, MD  gabapentin (NEURONTIN) 300 MG capsule Take 600 mg by mouth at bedtime.    [provider]  latanoprost (XALATAN) 0.005 % ophthalmic solution Place 1 drop into both eyes at bedtime.    [provider]  LORazepam (ATIVAN) 2 MG tablet Take 2 mg by mouth in the morning and at bedtime.     [provider]  Multiple Vitamin (MULTIVITAMIN) tablet Take 1 tablet by mouth at bedtime.     [provider]  oxyCODONE-acetaminophen (PERCOCET) 10-325 MG tablet Take 1 tablet by mouth every 6 (six) hours as needed for pain.    [provider]  pantoprazole (PROTONIX) 40 MG tablet  Take 1 tablet (40 mg total) by mouth daily. 11/23/19 03/19/20  ShahmehdiValeria Batman, MD  Probiotic Product (ALIGN) 4 MG CAPS Take 4 mg by mouth at bedtime.     [provider]  traZODone (DESYREL) 50 MG tablet Take 0.5-1 tablets (25-50 mg total) by mouth at bedtime as needed for sleep. Patient taking differently: Take 50 mg by  mouth at bedtime.  10/28/18   [name], MD    Allergies    Minocycline, Paxil [paroxetine hcl], Sulfa antibiotics, Remeron [mirtazapine], Trimox [amoxicillin], Viibryd [vilazodone hcl], Avelox [moxifloxacin hcl in nacl], Biaxin [clarithromycin], Cefuroxime, Levaquin [levofloxacin in d5w], Penicillins, and Prednisone  Review of Systems   Review of Systems  Constitutional: Negative for chills and fever.  HENT: Negative for congestion and rhinorrhea.   Eyes: Negative for redness and visual disturbance.  Respiratory: Negative for shortness of breath and wheezing.   Cardiovascular: Negative for chest pain and palpitations.  Gastrointestinal: Negative for nausea and vomiting.  Genitourinary: Negative for dysuria and urgency.  Musculoskeletal: Positive for arthralgias and myalgias.  Skin: Negative for pallor and wound.  Neurological: Positive for headaches. Negative for dizziness.    Physical Exam Updated Vital Signs BP 131/74   Pulse 87   Temp 99 F (37.2 C) (Oral)   Resp 13   Ht 5' (1.524 m)   Wt 41.7 kg   SpO2 94%   BMI 17.97 kg/m   Physical Exam Vitals and nursing note reviewed.  Constitutional:      General: She is not in acute distress.    Appearance: She is well-developed. She is not diaphoretic.  HENT:     Head: Normocephalic and atraumatic.  Eyes:     Pupils: Pupils are equal, round, and reactive to light.  Cardiovascular:     Rate and Rhythm: Normal rate and regular rhythm.     Heart sounds: No murmur heard. No friction rub. No gallop.   Pulmonary:     Effort: Pulmonary effort is normal.     Breath sounds: No wheezing or rales.   Abdominal:     General: There is no distension.     Palpations: Abdomen is soft.     Tenderness: There is no abdominal tenderness.  Musculoskeletal:        General: No tenderness.     Cervical back: Normal range of motion and neck supple.     Comments: Points to the left greater trochanter of area of pain.  No pain with internal or external rotation.  Pulse motor and sensation intact distally.  No obvious signs of trauma.  Skin:    General: Skin is warm and dry.  Neurological:     Mental Status: She is alert and oriented to person, place, and time.  Psychiatric:        Behavior: Behavior normal.     ED Results / Procedures / Treatments   Labs (all labs ordered are listed, but only abnormal results are displayed) Labs Reviewed  CBC WITH DIFFERENTIAL/PLATELET - Abnormal; Notable for the following components:      Result Value   WBC 22.8 (*)    HCT 46.7 (*)    Neutro Abs 20.5 (*)    Monocytes Absolute 1.2 (*)    Abs Immature Granulocytes 0.18 (*)    All other components within normal limits  BASIC METABOLIC PANEL - Abnormal; Notable for the following components:   GFR, Estimated 58 (*)    All other components within normal limits  RESP PANEL BY RT-PCR (FLU A&B, COVID) ARPGX2  CULTURE, BLOOD (ROUTINE X 2)  CULTURE, BLOOD (ROUTINE X 2)  BRAIN NATRIURETIC PEPTIDE  LACTIC ACID, PLASMA  LACTIC ACID, PLASMA  TROPONIN I (HIGH SENSITIVITY)    EKG EKG Interpretation  Date/Time:  Tuesday Sep 09 2020 07:55:27 EDT Ventricular Rate:  93 PR Interval:  142 QRS Duration: 78 QT Interval:  404 QTC Calculation:  503 R Axis:   -12 Text Interpretation: Sinus rhythm Borderline T abnormalities, anterior leads Prolonged QT interval t wave flattening qtc manually calc normal Otherwise no significant change Confirmed by Deno Etienne [phone]) on 09/09/2020 7:59:53 AM   Radiology DG Chest 1 View  Result Date: 09/09/2020 CLINICAL DATA:  Recent fall with left hip pain, initial encounter EXAM:  CHEST  1 VIEW COMPARISON:  07/24/2020 FINDINGS: Cardiac shadow is stable. Aortic calcifications are again seen. Changes of prior vertebral augmentation are noted at multiple levels. Increased right basilar airspace opacity is noted consistent with acute infiltrate. No effusion or pneumothorax is seen. No other focal abnormality is noted. IMPRESSION: New right basilar infiltrate. Electronically Signed   By: Inez Catalina M.D.   On: 09/09/2020 08:34   CT Head Wo Contrast  Result Date: 09/09/2020 CLINICAL DATA:  Unwitnessed fall this morning. Weakness since awakening this morning. EXAM: CT HEAD WITHOUT CONTRAST TECHNIQUE: Contiguous axial images were obtained from the base of the skull through the vertex without intravenous contrast. COMPARISON:  07/24/2020. FINDINGS: Brain: No evidence of acute infarction, hemorrhage, hydrocephalus, extra-axial collection or mass lesion/mass effect. Age related ventricular sulcal enlargement, old right basal ganglia lacunar infarct and patchy white matter hypoattenuation consistent with microvascular ischemic change, all stable. Vascular: No hyperdense vessel or unexpected calcification. Skull: Normal. Negative for fracture or focal lesion. Sinuses/Orbits: Globes and orbits are unremarkable. Sinuses are clear. Other: None. IMPRESSION: No acute intracranial abnormalities. Stable appearance from the prior exam. Electronically Signed   By: Lajean Manes M.D.   On: 09/09/2020 08:45   DG Hip Unilat W or Wo Pelvis 2-3 Views Left  Result Date: 09/09/2020 CLINICAL DATA:  Recent fall with left hip pain, initial encounter EXAM: DG HIP (WITH OR WITHOUT PELVIS) 2-3V LEFT COMPARISON:  03/19/2020 FINDINGS: Postsurgical changes are again seen in the proximal right femur. No acute abnormality is noted. Prior healed pubic rami fractures on the right are again noted. Right iliac bone fracture superior to the acetabulum is again noted and stable. Remainder of the pelvic ring appears intact.  Proximal left femur shows no acute abnormality. IMPRESSION: Chronic changes on the right. No acute left hip fracture is noted. Electronically Signed   By: Inez Catalina M.D.   On: 09/09/2020 08:33    Procedures Procedures   Medications Ordered in ED Medications  cefTRIAXone (ROCEPHIN) 1 g in sodium chloride 0.9 % 100 mL IVPB (has no administration in time range)  azithromycin (ZITHROMAX) 500 mg in sodium chloride 0.9 % 250 mL IVPB (has no administration in time range)  sodium chloride 0.9 % bolus 1,000 mL (has no administration in time range)  acetaminophen (TYLENOL) tablet 1,000 mg (1,000 mg Oral Given 09/09/20 4401)    ED Course  I have reviewed the triage vital signs and the nursing notes.  Pertinent labs & imaging results that were available during my care of the patient were reviewed by me and considered in my medical decision making (see chart for details).    MDM Rules/Calculators/A&P                          87 yo F with a chief complaint of a fall.  This happened last night.  She is noted to be hypoxic and requiring oxygen.  No history of the same.  Will obtain a chest x-ray blood work plain film of the left hip CT scan of the head.  Reassess.  Leukocytosis of 23,000.  Chest  x-ray with a new right basilar infiltrate.  Will start on antibiotics.  Discuss with hospitalist.   The patients results and plan were reviewed and discussed.   Any x-rays performed were independently reviewed by myself.   Differential diagnosis were considered with the presenting HPI.  Medications  cefTRIAXone (ROCEPHIN) 1 g in sodium chloride 0.9 % 100 mL IVPB (has no administration in time range)  azithromycin (ZITHROMAX) 500 mg in sodium chloride 0.9 % 250 mL IVPB (has no administration in time range)  sodium chloride 0.9 % bolus 1,000 mL (has no administration in time range)  acetaminophen (TYLENOL) tablet 1,000 mg (1,000 mg Oral Given 09/09/20 0851)    Vitals:   09/09/20 0752 09/09/20 0755  09/09/20 0800 09/09/20 0850  BP: 139/86  133/90 131/74  Pulse: 97  95 87  Resp: 17  (!) 21 13  Temp: 99 F (37.2 C)     TempSrc: Oral     SpO2: 92%  93% 94%  Weight:  41.7 kg    Height:  5' (1.524 m)      Final diagnoses:  Community acquired pneumonia of right lower lobe of lung    Admission/ observation were discussed with the admitting physician, patient and/or family and they are comfortable with the plan.   Final Clinical Impression(s) / ED Diagnoses Final diagnoses:  Community acquired pneumonia of right lower lobe of lung    Rx / DC Orders ED Discharge Orders    None       Deno Etienne, DO 09/09/20 [phone]

## 2020-09-10 ENCOUNTER — Inpatient Hospital Stay (HOSPITAL_COMMUNITY): Payer: Medicare Other

## 2020-09-10 ENCOUNTER — Other Ambulatory Visit (HOSPITAL_COMMUNITY): Payer: Self-pay

## 2020-09-10 DIAGNOSIS — G894 Chronic pain syndrome: Secondary | ICD-10-CM | POA: Diagnosis not present

## 2020-09-10 DIAGNOSIS — J189 Pneumonia, unspecified organism: Secondary | ICD-10-CM | POA: Diagnosis not present

## 2020-09-10 LAB — CBC
HCT: 35.5 % — ABNORMAL LOW (ref 36.0–46.0)
Hemoglobin: 11 g/dL — ABNORMAL LOW (ref 12.0–15.0)
MCH: 30.5 pg (ref 26.0–34.0)
MCHC: 31 g/dL (ref 30.0–36.0)
MCV: 98.3 fL (ref 80.0–100.0)
Platelets: 281 10*3/uL (ref 150–400)
RBC: 3.61 MIL/uL — ABNORMAL LOW (ref 3.87–5.11)
RDW: 12.1 % (ref 11.5–15.5)
WBC: 16.6 10*3/uL — ABNORMAL HIGH (ref 4.0–10.5)
nRBC: 0 % (ref 0.0–0.2)

## 2020-09-10 MED ORDER — ADULT MULTIVITAMIN W/MINERALS CH
1.0000 | ORAL_TABLET | Freq: Every day | ORAL | Status: DC
  Administered 2020-09-10 – 2020-09-11 (×2): 1 via ORAL
  Filled 2020-09-10 (×2): qty 1

## 2020-09-10 MED ORDER — CEFDINIR 300 MG PO CAPS
300.0000 mg | ORAL_CAPSULE | Freq: Two times a day (BID) | ORAL | 0 refills | Status: DC
  Filled 2020-09-10: qty 10, 5d supply, fill #0

## 2020-09-10 MED ORDER — ENSURE ENLIVE PO LIQD
237.0000 mL | Freq: Two times a day (BID) | ORAL | Status: DC
  Administered 2020-09-10 – 2020-09-11 (×3): 237 mL via ORAL

## 2020-09-10 MED ORDER — CEFDINIR 300 MG PO CAPS
300.0000 mg | ORAL_CAPSULE | Freq: Every day | ORAL | 0 refills | Status: AC
  Filled 2020-09-10: qty 5, 5d supply, fill #0

## 2020-09-10 MED ORDER — AZITHROMYCIN 250 MG PO TABS
ORAL_TABLET | ORAL | 0 refills | Status: DC
  Filled 2020-09-10: qty 5, 5d supply, fill #0

## 2020-09-10 NOTE — Progress Notes (Signed)
Initial Nutrition Assessment  DOCUMENTATION CODES:   Underweight,Severe malnutrition in context of chronic illness  INTERVENTION:   -Ensure Enlive po BID, each supplement provides 350 kcal and 20 grams of protein -MVI with minerals daily  NUTRITION DIAGNOSIS:   Severe Malnutrition related to chronic illness (CVA) as evidenced by energy intake < or equal to 75% for > or equal to 1 month,moderate fat depletion,severe fat depletion,moderate muscle depletion,severe muscle depletion.  GOAL:   Patient will meet greater than or equal to 90% of their needs  MONITOR:   PO intake,Supplement acceptance,Labs,Weight trends,Skin,I & O's  REASON FOR ASSESSMENT:   Consult Assessment of nutrition requirement/status  ASSESSMENT:   Katrina Burnett is a 87 y.o. female with medical history significant of CVA; PMR; depression/anxiety; and chronic pain presenting with fall.  She reports that she felt mildly SOB during the day yesterday.  She got up to use the bathroom overnight and was too weak to lift herself off the commode and so she eventually slid to the floor.  She denies cough.  No known sick contacts.  Pt admitted with CAP.   5/4- s/p BSE- recommend regular diet  Reviewed I/O's: +92 ml x 24 hours  UOP: 250 ml x 24 hours  Spoke with pt at bedside, who reports decreased appetite over the past month. Pt explains that she lives at [facility] and consumes 2 meals per day. Per pt, she has been eating less as she does not like the food that is provided to her (meals are delivered to her room two times daily from the dining room). Meal consist of foods such as oatmeal, eggs and salmon. Pt shares she will often eat a [place] sandwich if she does not like the food that is being offered.   Pt unsure of UBW or how much weight she has lost, however, is certain that she has lsot weight. Per wt hx; pt has experienced a 9.3% wt loss over the past year. While this is not significant for time frame, it is  concerning given pt's advanced age and multiple co-morbidities.   Discussed importance of good meal and supplement intake to promote healing. Pt reports she has drank Ensure supplements in the past "but not the way I should".  Medications reviewed and include colace and florastor.   Labs reviewed.   NUTRITION - FOCUSED PHYSICAL EXAM:  Flowsheet Row Most Recent Value  Orbital Region Moderate depletion  Upper Arm Region Severe depletion  Thoracic and Lumbar Region Severe depletion  Buccal Region Moderate depletion  Temple Region Severe depletion  Clavicle Bone Region Mild depletion  Clavicle and Acromion Bone Region Mild depletion  Scapular Bone Region Mild depletion  Dorsal Hand Severe depletion  Patellar Region Severe depletion  Anterior Thigh Region Severe depletion  Posterior Calf Region Severe depletion  Edema (RD Assessment) None  Hair Reviewed  Eyes Reviewed  Mouth Reviewed  Skin Reviewed  Nails Reviewed       Diet Order:   Diet Order            Diet regular Room service appropriate? Yes; Fluid consistency: Thin  Diet effective now                 EDUCATION NEEDS:   Education needs have been addressed  Skin:  Skin Assessment: Skin Integrity Issues: Skin Integrity Issues:: Other (Comment) Other: MASD under rt breast, non-pressure wound on buttocks  Last BM:  09/07/20  Height:   Ht Readings from Last 1 Encounters:  09/09/20  5' (1.524 m)    Weight:   Wt Readings from Last 1 Encounters:  09/09/20 41.7 kg    Ideal Body Weight:  45.5 kg  BMI:  Body mass index is 17.97 kg/m.  Estimated Nutritional Needs:   Kcal:  1250-1450  Protein:  65-80 grams  Fluid:  > 1.2 L    Loistine Chance, RD, LDN, [place] Registered Dietitian II Certified Diabetes Care and Education Specialist Please refer to [facility] for RD and/or RD on-call/weekend/after hours pager

## 2020-09-10 NOTE — Evaluation (Signed)
Clinical/Bedside Swallow Evaluation Patient Details  Name: Katrina Burnett MRN: [id number] Date of Birth: 11-19-1924  Today's Date: 09/10/2020 Time: SLP Start Time (ACUTE ONLY): 6387 SLP Stop Time (ACUTE ONLY): 5643 SLP Time Calculation (min) (ACUTE ONLY): 17.65 min  Past Medical History:  Past Medical History:  Diagnosis Date  . Allergic rhinitis   . Anxiety   . Arthritis    "hands" (11/25/2016)  . Barrett's esophagus 06/1997  . CAP (community acquired pneumonia) 11/25/2016   "this is my 3rd time having pneumonia" (11/25/2016)  . Chondromalacia   . Chronic lower back pain   . Depression   . Diverticulosis   . GERD (gastroesophageal reflux disease)   . Glaucoma   . History of hiatal hernia   . History of stomach ulcers   . Lumbar compression fracture (HCC)    L1 and L2  . Osteoporosis   . Pneumonia   . Polymyalgia ([place])   . Stroke ([place])   . Tubular adenoma of colon 06/1999  . Vaginitis   . Varicose veins    Past Surgical History:  Past Surgical History:  Procedure Laterality Date  . BACK SURGERY    . BIOPSY  11/19/2019   Procedure: BIOPSY;  Surgeon: Gatha Mayer, MD;  Location: [facility];  Service: Endoscopy;;  . CATARACT EXTRACTION W/ INTRAOCULAR LENS  IMPLANT, BILATERAL Bilateral   . ESOPHAGOGASTRODUODENOSCOPY (EGD) WITH PROPOFOL N/A 11/19/2019   Procedure: ESOPHAGOGASTRODUODENOSCOPY (EGD) WITH PROPOFOL;  Surgeon: Gatha Mayer, MD;  Location: [facility];  Service: Endoscopy;  Laterality: N/A;  . FIXATION KYPHOPLASTY LUMBAR SPINE  10/12/10; 09/20/11  . FRACTURE SURGERY    . IR KYPHO THORACIC WITH BONE BIOPSY  03/24/2018  . IR VERTEBROPLASTY CERV/THOR BX INC UNI/BIL INC/INJECT/IMAGING  03/24/2018  . LUMBAR SPINE SURGERY     "did OR on 2 fractures in my lower back"  . LUNG BIOPSY    . ORIF HIP FRACTURE Right 05/23/2015   Procedure: OPEN REDUCTION INTERNAL FIXATION HIP;  Surgeon: Frederik Pear, MD;  Location: [facility];  Service: Orthopedics;  Laterality: Right;  Marland Kitchen VARICOSE  VEIN SURGERY     vein ligation and stripping "years ago"   HPI:  Pt is a 87 y.o. female with medical history significant for CVA, PMR, depression/anxiety, reflux, and chronic pain who presented with fall. CT head 5/3 was negative for acute changes. CXR 5/3: Increased right basilar airspace opacity is noted consistent with acute infiltrate.   Assessment / Plan / Recommendation Clinical Impression  Pt was seen for bedside swallow evaluation and she denied a history of dysphagia. Oral mechanism exam was [facility] and she presented with adequate, natural dentition. She tolerated all solids and liquids without signs or symptoms of oropharyngeal dysphagia. It is recommended that her current diet of regular texture diet with thin liquids be continued. Considering results of imaging, a modified barium swallow study is recommended to rule out silent aspiration and it is currently scheduled for today at 1430. SLP Visit Diagnosis: Dysphagia, unspecified (R13.10)    Aspiration Risk  Mild aspiration risk    Diet Recommendation Regular;Thin liquid   Liquid Administration via: Cup;Straw Medication Administration: Whole meds with liquid Supervision: Staff to assist with self feeding Compensations: Minimize environmental distractions    Other  Recommendations     Follow up Recommendations  (TBD)      Frequency and Duration min 2x/week  1 week       Prognosis Prognosis for Safe Diet Advancement: Good      Swallow  Study   General Date of Onset: 09/09/20 HPI: Pt is a 87 y.o. female with medical history significant for CVA, PMR, depression/anxiety, reflux, and chronic pain who presented with fall. CT head 5/3 was negative for acute changes. CXR 5/3: Increased right basilar airspace opacity is noted consistent with acute infiltrate. Type of Study: Bedside Swallow Evaluation Previous Swallow Assessment: none Diet Prior to this Study: Regular;Thin liquids Temperature Spikes Noted: No Respiratory Status:  Room air History of Recent Intubation: No Behavior/Cognition: Alert;Cooperative;Pleasant mood Oral Cavity Assessment: Within Functional Limits Oral Care Completed by SLP: No Vision: Functional for self-feeding Self-Feeding Abilities: Able to feed self Patient Positioning: Upright in bed;Postural control adequate for testing Baseline Vocal Quality: Normal Volitional Swallow: Able to elicit    Oral/Motor/Sensory Function Overall Oral Motor/Sensory Function: Within functional limits   Ice Chips Ice chips: Within functional limits Presentation: Spoon   Thin Liquid Thin Liquid: Within functional limits Presentation: Straw;Cup    Nectar Thick Nectar Thick Liquid: Not tested   Honey Thick Honey Thick Liquid: Not tested   Puree Puree: Within functional limits Presentation: Spoon   Solid     Solid: Within functional limits Presentation: [place] [address]. Hardin Negus, [place], [place] Office number [phone] Pager [place] 09/10/2020,10:05 AM

## 2020-09-10 NOTE — TOC Initial Note (Addendum)
Transition of Care [facility]) - Initial/Assessment Note    Patient Details  Name: Katrina Burnett MRN: [id number] Date of Birth: August 21, 1924  Transition of Care [facility]) CM/SW Contact:    Joanne Chars, LCSW Phone Number: 09/10/2020, 11:08 AM  Clinical Narrative:   CSW met with pt regarding discharge recommendations.  Pt reports she is in independent living at [facility].  Permission given to speak with daughter [name] and son in [employer] account executive.  Current equipment per patient: walker, shower chair.  Pt unclear on how many covid shots she has received--epic has two recorded.    CSW left message with pt daughter [name]. CSW spoke with Will at [facility] (c: [phone])  He confirmed pt is independent living there, RN services already in place.  Discussed recommendation for [facility], CSW will contact Philippa Sicks.     1120: CSW received call back from daughter [name].  She confirmed that they do want pt to return to [facility]. Pt has had PT services there before, has declined when they have tried to work with her sometimes, but daughter in favor of using [facility] for PT services.   1215: Per MD, pt ready for DC.  CSW spoke with Will at [facility] and informed him.  CSW spoke with Philippa Sicks at [facility].  Pt is already current with their service for PT/OT, just needs orders faxed.  CSW informed pt daughter as well, she will contact the private duty aides to resume today.                 Expected Discharge Plan: Assisted Living Barriers to Discharge: Continued Medical Work up   Patient Goals and CMS Choice Patient states their goals for this hospitalization and ongoing recovery are:: "get back to taking care of myself" CMS Medicare.gov Compare Post Acute Care list provided to::  (NA-return to [facility])    Expected Discharge Plan and Services Expected Discharge Plan: Assisted Living In-house Referral: Clinical Social Work   Post Acute Care Choice: [place] arrangements for the past 2  months: [place]                                      Prior Living Arrangements/Services Living arrangements for the past 2 months: [place] Lives with:: Facility Resident Patient language and need for interpreter reviewed:: Yes        Need for Family Participation in Patient Care: Yes (Comment) Care giver support system in place?: Yes (comment) Current home services: Other (comment) (none) Criminal Activity/Legal Involvement Pertinent to Current Situation/Hospitalization: No - Comment as needed  Activities of Daily Living Home Assistive Devices/Equipment: Walker (specify type) (standard) ADL Screening (condition at time of admission) Patient's cognitive ability adequate to safely complete daily activities?: Yes Is the patient deaf or have difficulty hearing?: No Does the patient have difficulty seeing, even when wearing glasses/contacts?: No Does the patient have difficulty concentrating, remembering, or making decisions?: No Patient able to express need for assistance with ADLs?: Yes Does the patient have difficulty dressing or bathing?: Yes Independently performs ADLs?: No Communication: Independent Dressing (OT): Needs assistance Is this a change from baseline?: Pre-admission baseline Grooming: Needs assistance Is this a change from baseline?: Pre-admission baseline Bathing: Needs assistance Is this a change from baseline?: Pre-admission baseline Toileting: Needs assistance Is this a change from baseline?: Pre-admission baseline In/Out Bed: Needs assistance Is this a change from baseline?: Pre-admission baseline Walks  in Home: Needs assistance Is this a change from baseline?: Pre-admission baseline Does the patient have difficulty walking or climbing stairs?: Yes Weakness of Legs: Both Weakness of Arms/Hands: Both  Permission Sought/Granted Permission sought to share information with : Family Supports Permission granted to  share information with : Yes, Verbal Permission Granted  Share Information with NAME: daughter [name], son in law [name]  Permission granted to share info w AGENCY: [facility]        Emotional Assessment Appearance:: Appears younger than stated age Attitude/Demeanor/Rapport: Engaged Affect (typically observed): Appropriate,Pleasant Orientation: :  (not available) Alcohol / Substance Use: Not Applicable Psych Involvement: No (comment)  Admission diagnosis:  Fall [W19.XXXA] CAP (community acquired pneumonia) [J18.9] Community acquired pneumonia of right lower lobe of lung [J18.9] Patient Active Problem List   Diagnosis Date Noted  . CAP (community acquired pneumonia) 09/09/2020  . Malnutrition ([place]) 09/09/2020  . Chronic pain disorder 09/09/2020  . Glaucoma 09/09/2020  . DNR (do not resuscitate) 09/09/2020  . Acute respiratory failure with hypoxia ([place]) 01/24/2020  . Acute hypoxemic respiratory failure ([place]) 01/23/2020  . Depression with anxiety   . Thrombocytosis   . GIB (gastrointestinal bleeding) 11/21/2019  . Esophageal mass 11/19/2019  . Hiatal hernia   . Esophagitis   . Pelvic fracture ([place]) 11/11/2019  . Pneumothorax on left 10/24/2018  . Pneumothorax, left 10/24/2018  . Acute on chronic anemia 11/25/2016  . Hyponatremia 11/25/2016  . Fracture, intertrochanteric, right femur ([place]) 05/26/2015  . Closed fracture of femur ([place])   . Acute blood loss anemia   . Adjustment disorder with mixed anxiety and depressed mood   . Fall   . Abnormality of gait   . Femur fracture, right, closed, initial encounter 05/23/2015  . Fracture of femoral neck, right, closed ([place]) 05/22/2015  . Femoral neck fracture, right, closed, initial encounter 05/22/2015  . Syncope 05/30/2012  . Nausea & vomiting 05/30/2012  . Chest pain 05/30/2012  . Hypotension 05/30/2012  . GERD (gastroesophageal reflux disease) 12/16/2011  . Lumbar compression fracture ([place])   . Polymyalgia ([place])    PCP:   [name], DO Pharmacy:   [facility], [place] [address] [place] [address] Phone: [phone] Fax: [phone]  [facility], [place] - [address] [place] [address] Phone: [phone] Fax: [phone]     Social Determinants of Health (SDOH) Interventions    Readmission Risk Interventions No flowsheet data found.

## 2020-09-10 NOTE — Discharge Summary (Signed)
Physician Discharge Summary  Katrina Burnett [id number] DOB: 1925-03-18 DOA: 09/09/2020  PCP: [name], DO  Admit date: 09/09/2020 Discharge date: 09/11/2020  Admitted From: [facility] Disposition:  ILF  Recommendations for Outpatient Follow-up:  1. Follow up with PCP in 1-2 weeks  Home Health:PT, OT   Discharge Condition:Improved CODE STATUS:DNR Diet recommendation: Regular   Brief/Interim Summary: 87 y.o.femalewith medical history significant ofCVA; PMR; depression/anxiety; and chronic pain presenting with fall.She reports that she felt mildly SOB during the day yesterday. She got up to use the bathroom overnight and was too weak to lift herself off the commode and so she eventually slid to the floor. She denies cough. No known sick contacts  Discharge Diagnoses:  Principal Problem:   CAP (community acquired pneumonia) Active Problems:   Depression with anxiety   Malnutrition ([place])   Chronic pain disorder   Glaucoma   DNR (do not resuscitate)  CAP without sepsis -Patient presenting withweakness, SOB, mildly decreased oxygen saturation, and infiltrate in right lower lobe on chest x-ray -Pt had been continued on azithromycin and rocephin -Pt reports feeling improved. WBC has improved from 22k to 14k -Seen by PT, recommendation for HHPT. Remained stable  Malnutrition/Frailty -Body mass index is 17.97 kg/m..  -The patient has at least 2 indicators for malnutrition (insufficient energy intake, weight loss, loss of muscle mass, loss of subcutaneous fat -Seen by SLP, cleared for regular diet -Seen by PT, recs for HHPT  Chronic pain -Seems to be stable at this time -Continue Percocet, Flexeril, Neurontin  Depression/anxiety -Continue Celexa, Ativan, Trazodone  Glaucoma -Continue Trusopt, Xalatan  DNR -Noted at time of presentation  Hypokalemia -Replaced  Discharge Instructions   Allergies as of 09/11/2020      Reactions   Minocycline  Nausea And Vomiting   Paxil [paroxetine Hcl] Nausea And Vomiting   Sulfa Antibiotics Nausea And Vomiting   Amoxicillin Other (See Comments)   Doesn't remember  Has patient had a PCN reaction causing immediate rash, facial/tongue/throat swelling, SOB or lightheadedness with hypotension: Unknown Has patient had a PCN reaction causing severe rash involving mucus membranes or skin necrosis: Unknown Has patient had a PCN reaction that required hospitalization: Unknown Has patient had a PCN reaction occurring within the last 10 years: Unknown If all of the above answers are "NO", then may proceed with Cephalosporin use. Other reaction(s): sore mouth   Bupropion Hcl Er (sr)    Other reaction(s): fatigue   Cyclobenzaprine    Other reaction(s): dry mouth   Mirtazapine Other (See Comments)   Doesn't remember  Other reaction(s): jittery   Vilazodone Hcl Other (See Comments)   Doesn't remember  Other reaction(s): dizziness   Avelox [moxifloxacin Hcl In Nacl] Other (See Comments)   Felt bad   Cefuroxime Diarrhea   Other reaction(s): severe diarrhea   Clarithromycin Nausea Only   Other reaction(s): stomach pain   Levaquin [levofloxacin In D5w] Diarrhea   Nystatin-triamcinolone Rash   Penicillins Rash   Prednisone Nausea Only   Other reaction(s): sick      Medication List    TAKE these medications   acetaminophen 325 MG tablet Commonly known as: TYLENOL Take 325 mg by mouth every 6 (six) hours as needed (for breakthrough pain in between percocet). -MAX DOSE IN 24 HOURS- 3000mg    Align 4 MG Caps Take 4 mg by mouth at bedtime.   azithromycin 250 MG tablet Commonly known as: Zithromax take 1 tablet by mouth daily x 5 days, then stop   cefdinir  300 MG capsule Commonly known as: OMNICEF Take 1 capsule (300 mg total) by mouth daily for 5 days.   citalopram 40 MG tablet Commonly known as: CELEXA Take 60 mg by mouth daily.   cyclobenzaprine 10 MG tablet Commonly known as:  FLEXERIL Take 10 mg by mouth every 8 (eight) hours as needed for muscle spasms.   dorzolamide 2 % ophthalmic solution Commonly known as: TRUSOPT Place 1 drop into both eyes 2 (two) times daily.   ferrous sulfate 325 (65 FE) MG tablet Take 1 tablet (325 mg total) by mouth 2 (two) times daily with a meal.   gabapentin 300 MG capsule Commonly known as: NEURONTIN Take 600 mg by mouth at bedtime.   latanoprost 0.005 % ophthalmic solution Commonly known as: XALATAN Place 1 drop into both eyes at bedtime.   Linzess 72 MCG capsule Generic drug: linaclotide Take 72 mcg by mouth every morning.   LORazepam 2 MG tablet Commonly known as: ATIVAN Take 2 mg by mouth 3 (three) times daily.   multivitamin tablet Take 1 tablet by mouth at bedtime.   oxyCODONE-acetaminophen 10-325 MG tablet Commonly known as: PERCOCET Take 1 tablet by mouth every 6 (six) hours as needed for pain.   pantoprazole 40 MG tablet Commonly known as: Protonix Take 1 tablet (40 mg total) by mouth daily.   traZODone 50 MG tablet Commonly known as: DESYREL Take 0.5-1 tablets (25-50 mg total) by mouth at bedtime as needed for sleep. What changed:   how much to take  when to take this       Contact information for follow-up providers    [name], DO. Schedule an appointment as soon as possible for a visit in 2 week(s).   Specialty: Family Medicine Contact information: [address] [place] [address] [phone]            Contact information for after-discharge care    Destination    [facility] .   Service: Assisted Living Contact information: [place] [address] [phone]                 Allergies  Allergen Reactions  . Minocycline Nausea And Vomiting  . Paxil [Paroxetine Hcl] Nausea And Vomiting  . Sulfa Antibiotics Nausea And Vomiting  . Amoxicillin Other (See Comments)    Doesn't remember  Has patient  had a PCN reaction causing immediate rash, facial/tongue/throat swelling, SOB or lightheadedness with hypotension: Unknown Has patient had a PCN reaction causing severe rash involving mucus membranes or skin necrosis: Unknown Has patient had a PCN reaction that required hospitalization: Unknown Has patient had a PCN reaction occurring within the last 10 years: Unknown If all of the above answers are "NO", then may proceed with Cephalosporin use. Other reaction(s): sore mouth  . Bupropion Hcl Er (Sr)     Other reaction(s): fatigue  . Cyclobenzaprine     Other reaction(s): dry mouth  . Mirtazapine Other (See Comments)    Doesn't remember  Other reaction(s): jittery  . Vilazodone Hcl Other (See Comments)    Doesn't remember  Other reaction(s): dizziness  . Avelox [Moxifloxacin Hcl In Nacl] Other (See Comments)    Felt bad  . Cefuroxime Diarrhea    Other reaction(s): severe diarrhea  . Clarithromycin Nausea Only    Other reaction(s): stomach pain  . Levaquin [Levofloxacin In D5w] Diarrhea  . Nystatin-Triamcinolone Rash  . Penicillins Rash  . Prednisone Nausea Only  Other reaction(s): sick    Procedures/Studies: DG Chest 1 View  Result Date: 09/09/2020 CLINICAL DATA:  Recent fall with left hip pain, initial encounter EXAM: CHEST  1 VIEW COMPARISON:  07/24/2020 FINDINGS: Cardiac shadow is stable. Aortic calcifications are again seen. Changes of prior vertebral augmentation are noted at multiple levels. Increased right basilar airspace opacity is noted consistent with acute infiltrate. No effusion or pneumothorax is seen. No other focal abnormality is noted. IMPRESSION: New right basilar infiltrate. Electronically Signed   By: Inez Catalina M.D.   On: 09/09/2020 08:34   CT Head Wo Contrast  Result Date: 09/09/2020 CLINICAL DATA:  Unwitnessed fall this morning. Weakness since awakening this morning. EXAM: CT HEAD WITHOUT CONTRAST TECHNIQUE: Contiguous axial images were obtained from the  base of the skull through the vertex without intravenous contrast. COMPARISON:  07/24/2020. FINDINGS: Brain: No evidence of acute infarction, hemorrhage, hydrocephalus, extra-axial collection or mass lesion/mass effect. Age related ventricular sulcal enlargement, old right basal ganglia lacunar infarct and patchy white matter hypoattenuation consistent with microvascular ischemic change, all stable. Vascular: No hyperdense vessel or unexpected calcification. Skull: Normal. Negative for fracture or focal lesion. Sinuses/Orbits: Globes and orbits are unremarkable. Sinuses are clear. Other: None. IMPRESSION: No acute intracranial abnormalities. Stable appearance from the prior exam. Electronically Signed   By: Lajean Manes M.D.   On: 09/09/2020 08:45   DG Swallowing Func-Speech Pathology  Result Date: 09/10/2020 Objective Swallowing Evaluation: Type of Study: MBS-Modified Barium Swallow Study  Patient Details Name: AASIYAH AUERBACH MRN: [id number] Date of Birth: January 07, 1925 Today's Date: 09/10/2020 Time: SLP Start Time (ACUTE ONLY): 1501 -SLP Stop Time (ACUTE ONLY): 1516 SLP Time Calculation (min) (ACUTE ONLY): 15 min Past Medical History: Past Medical History: Diagnosis Date . Allergic rhinitis  . Anxiety  . Arthritis   "hands" (11/25/2016) . Barrett's esophagus 06/1997 . CAP (community acquired pneumonia) 11/25/2016  "this is my 3rd time having pneumonia" (11/25/2016) . Chondromalacia  . Chronic lower back pain  . Depression  . Diverticulosis  . GERD (gastroesophageal reflux disease)  . Glaucoma  . History of hiatal hernia  . History of stomach ulcers  . Lumbar compression fracture (HCC)   L1 and L2 . Osteoporosis  . Pneumonia  . Polymyalgia ([place])  . Stroke ([place])  . Tubular adenoma of colon 06/1999 . Vaginitis  . Varicose veins  Past Surgical History: Past Surgical History: Procedure Laterality Date . BACK SURGERY   . BIOPSY  11/19/2019  Procedure: BIOPSY;  Surgeon: Gatha Mayer, MD;  Location: [facility];  Service:  Endoscopy;; . CATARACT EXTRACTION W/ INTRAOCULAR LENS  IMPLANT, BILATERAL Bilateral  . ESOPHAGOGASTRODUODENOSCOPY (EGD) WITH PROPOFOL N/A 11/19/2019  Procedure: ESOPHAGOGASTRODUODENOSCOPY (EGD) WITH PROPOFOL;  Surgeon: Gatha Mayer, MD;  Location: [facility];  Service: Endoscopy;  Laterality: N/A; . FIXATION KYPHOPLASTY LUMBAR SPINE  10/12/10; 09/20/11 . FRACTURE SURGERY   . IR KYPHO THORACIC WITH BONE BIOPSY  03/24/2018 . IR VERTEBROPLASTY CERV/THOR BX INC UNI/BIL INC/INJECT/IMAGING  03/24/2018 . LUMBAR SPINE SURGERY    "did OR on 2 fractures in my lower back" . LUNG BIOPSY   . ORIF HIP FRACTURE Right 05/23/2015  Procedure: OPEN REDUCTION INTERNAL FIXATION HIP;  Surgeon: Frederik Pear, MD;  Location: [facility];  Service: Orthopedics;  Laterality: Right; Marland Kitchen VARICOSE VEIN SURGERY    vein ligation and stripping "years ago" HPI: Pt is a 87 y.o. female with medical history significant for CVA, PMR, depression/anxiety, reflux, and chronic pain who presented with fall. CT head 5/3 was  negative for acute changes. CXR 5/3: Increased right basilar airspace opacity is noted consistent with acute infiltrate.  No data recorded Assessment / Plan / Recommendation CHL IP CLINICAL IMPRESSIONS 09/10/2020 Clinical Impression Pt was seen in radiology suite for modified barium swallow study. Trials of puree solids, regular texture solids, a 54mm barium tablet, and thin liquids via cup and straw were administered. Pt's oropharyngeal swallow mechanism was within limits without any instances of penetration/aspiration and adequate oropharyngeal clearance. Further skilled SLP services are not clinically indicated at this time. SLP Visit Diagnosis Dysphagia, pharyngeal phase (R13.13) Attention and concentration deficit following -- Frontal lobe and executive function deficit following -- Impact on safety and function Mild aspiration risk   CHL IP TREATMENT RECOMMENDATION 09/10/2020 Treatment Recommendations No treatment recommended at this time    Prognosis 09/10/2020 Prognosis for Safe Diet Advancement Good Barriers to Reach Goals -- Barriers/Prognosis Comment -- CHL IP DIET RECOMMENDATION 09/10/2020 SLP Diet Recommendations Regular solids;Thin liquid Liquid Administration via Cup;Straw Medication Administration Whole meds with liquid Compensations -- Postural Changes Seated upright at 90 degrees   CHL IP OTHER RECOMMENDATIONS 09/10/2020 Recommended Consults -- Oral Care Recommendations Oral care BID Other Recommendations --   CHL IP FOLLOW UP RECOMMENDATIONS 09/10/2020 Follow up Recommendations None   CHL IP FREQUENCY AND DURATION 09/10/2020 Speech Therapy Frequency (ACUTE ONLY) min 2x/week Treatment Duration 1 week      CHL IP ORAL PHASE 09/10/2020 Oral Phase WFL Oral - Pudding Teaspoon -- Oral - Pudding Cup -- Oral - Honey Teaspoon -- Oral - Honey Cup -- Oral - Nectar Teaspoon -- Oral - Nectar Cup -- Oral - Nectar Straw -- Oral - Thin Teaspoon -- Oral - Thin Cup -- Oral - Thin Straw -- Oral - Puree -- Oral - Mech Soft -- Oral - Regular -- Oral - Multi-Consistency -- Oral - Pill -- Oral Phase - Comment --  CHL IP PHARYNGEAL PHASE 09/10/2020 Pharyngeal Phase WFL Pharyngeal- Pudding Teaspoon -- Pharyngeal -- Pharyngeal- Pudding Cup -- Pharyngeal -- Pharyngeal- Honey Teaspoon -- Pharyngeal -- Pharyngeal- Honey Cup -- Pharyngeal -- Pharyngeal- Nectar Teaspoon -- Pharyngeal -- Pharyngeal- Nectar Cup -- Pharyngeal -- Pharyngeal- Nectar Straw -- Pharyngeal -- Pharyngeal- Thin Teaspoon -- Pharyngeal -- Pharyngeal- Thin Cup -- Pharyngeal -- Pharyngeal- Thin Straw -- Pharyngeal -- Pharyngeal- Puree -- Pharyngeal -- Pharyngeal- Mechanical Soft -- Pharyngeal -- Pharyngeal- Regular -- Pharyngeal -- Pharyngeal- Multi-consistency -- Pharyngeal -- Pharyngeal- Pill -- Pharyngeal -- Pharyngeal Comment --  CHL IP CERVICAL ESOPHAGEAL PHASE 09/10/2020 Cervical Esophageal Phase WFL Pudding Teaspoon -- Pudding Cup -- Honey Teaspoon -- Honey Cup -- Nectar Teaspoon -- Nectar Cup -- Nectar Straw  -- Thin Teaspoon -- Thin Cup -- Thin Straw -- Puree -- Mechanical Soft -- Regular -- Multi-consistency -- Pill -- Cervical Esophageal Comment -- Shanika I. Hardin Negus, [place], [place] Office number [phone] Pager [phone] Horton Marshall 09/10/2020, 3:23 PM              DG Hip Unilat W or Wo Pelvis 2-3 Views Left  Result Date: 09/09/2020 CLINICAL DATA:  Recent fall with left hip pain, initial encounter EXAM: DG HIP (WITH OR WITHOUT PELVIS) 2-3V LEFT COMPARISON:  03/19/2020 FINDINGS: Postsurgical changes are again seen in the proximal right femur. No acute abnormality is noted. Prior healed pubic rami fractures on the right are again noted. Right iliac bone fracture superior to the acetabulum is again noted and stable. Remainder of the pelvic ring appears intact. Proximal left femur shows no acute  abnormality. IMPRESSION: Chronic changes on the right. No acute left hip fracture is noted. Electronically Signed   By: Inez Catalina M.D.   On: 09/09/2020 08:33    Subjective: Eager to be discharged today  Discharge Exam: Vitals:   09/10/20 1925 09/11/20 0511  BP: (!) 148/74 (!) 153/83  Pulse: 69 79  Resp: 18 18  Temp: 98.9 F (37.2 C) 98.4 F (36.9 C)  SpO2: 92% 97%   Vitals:   09/10/20 1023 09/10/20 1537 09/10/20 1925 09/11/20 0511  BP: 120/68 (!) 129/52 (!) 148/74 (!) 153/83  Pulse:   69 79  Resp:   18 18  Temp:  98.9 F (37.2 C) 98.9 F (37.2 C) 98.4 F (36.9 C)  TempSrc:  Oral Oral Axillary  SpO2:  91% 92% 97%  Weight:      Height:        General: Pt is alert, awake, not in acute distress Cardiovascular: RRR, S1/S2 +, no rubs, no gallops Respiratory: CTA bilaterally, no wheezing, no rhonchi Abdominal: Soft, NT, ND, bowel sounds + Extremities: no edema, no cyanosis   The results of significant diagnostics from this hospitalization (including imaging, microbiology, ancillary and laboratory) are listed below for reference.      Microbiology: Recent Results (from the past 240 hour(s))  MRSA PCR Screening     Status: None   Collection Time: 09/09/20 12:10 AM   Specimen: Nasopharyngeal  Result Value Ref Range Status   MRSA by PCR NEGATIVE NEGATIVE Final    Comment:        The GeneXpert MRSA Assay (FDA approved for NASAL specimens only), is one component of a comprehensive MRSA colonization surveillance program. It is not intended to diagnose MRSA infection nor to guide or monitor treatment for MRSA infections. Performed at [facility], [place] [address] [place], [place] [address]   Resp Panel by RT-PCR (Flu A&B, Covid) Nasopharyngeal Swab     Status: None   Collection Time: 09/09/20  8:00 AM   Specimen: Nasopharyngeal Swab; Nasopharyngeal(NP) swabs in vial transport medium  Result Value Ref Range Status   SARS Coronavirus 2 by RT PCR NEGATIVE NEGATIVE Final    Comment: (NOTE) SARS-CoV-2 target nucleic acids are NOT DETECTED.  The SARS-CoV-2 RNA is generally detectable in upper respiratory specimens during the acute phase of infection. The lowest concentration of SARS-CoV-2 viral copies this assay can detect is 138 copies/mL. A negative result does not preclude SARS-Cov-2 infection and should not be used as the sole basis for treatment or other patient management decisions. A negative result may occur with  improper specimen collection/handling, submission of specimen other than nasopharyngeal swab, presence of viral mutation(s) within the areas targeted by this assay, and inadequate number of viral copies(<138 copies/mL). A negative result must be combined with clinical observations, patient history, and epidemiological information. The expected result is Negative.  Fact Sheet for Patients:  EntrepreneurPulse.com.au  Fact Sheet for Healthcare Providers:  IncredibleEmployment.be  This test is no t yet approved or cleared by the [place] FDA and   has been authorized for detection and/or diagnosis of SARS-CoV-2 by FDA under an Emergency Use Authorization (EUA). This EUA will remain  in effect (meaning this test can be used) for the duration of the COVID-19 declaration under Section 564(b)(1) of the Act, 21 U.S.C.section 360bbb-3(b)(1), unless the authorization is terminated  or revoked sooner.       Influenza A by PCR NEGATIVE NEGATIVE Final   Influenza B by PCR NEGATIVE NEGATIVE  Final    Comment: (NOTE) The Xpert Xpress SARS-CoV-2/FLU/RSV plus assay is intended as an aid in the diagnosis of influenza from Nasopharyngeal swab specimens and should not be used as a sole basis for treatment. Nasal washings and aspirates are unacceptable for Xpert Xpress SARS-CoV-2/FLU/RSV testing.  Fact Sheet for Patients: EntrepreneurPulse.com.au  Fact Sheet for Healthcare Providers: IncredibleEmployment.be  This test is not yet approved or cleared by the [place] FDA and has been authorized for detection and/or diagnosis of SARS-CoV-2 by FDA under an Emergency Use Authorization (EUA). This EUA will remain in effect (meaning this test can be used) for the duration of the COVID-19 declaration under Section 564(b)(1) of the Act, 21 U.S.C. section 360bbb-3(b)(1), unless the authorization is terminated or revoked.  Performed at [facility], [place] [address] [place], [place] [address]   Blood culture (routine x 2)     Status: None (Preliminary result)   Collection Time: 09/09/20  9:19 AM   Specimen: BLOOD RIGHT FOREARM  Result Value Ref Range Status   Specimen Description BLOOD RIGHT FOREARM  Final   Special Requests   Final    BOTTLES DRAWN AEROBIC ONLY Blood Culture results may not be optimal due to an inadequate volume of blood received in culture bottles   Culture   Final    NO GROWTH 2 DAYS Performed at [facility], [place] [address], [place], [place] [address]    Report Status  PENDING  Incomplete  Blood culture (routine x 2)     Status: None (Preliminary result)   Collection Time: 09/09/20  9:20 AM   Specimen: BLOOD  Result Value Ref Range Status   Specimen Description BLOOD LEFT ANTECUBITAL  Final   Special Requests   Final    BOTTLES DRAWN AEROBIC AND ANAEROBIC Blood Culture results may not be optimal due to an inadequate volume of blood received in culture bottles   Culture   Final    NO GROWTH 2 DAYS Performed at [facility], [place] [address], [place], [place] [address]    Report Status PENDING  Incomplete     Labs: BNP (last 3 results) Recent Labs    09/09/20 0800  BNP 123456*   Basic Metabolic Panel: Recent Labs  Lab 09/09/20 0800 09/11/20 0454  NA 136 139  K 4.1 3.4*  CL 100 105  CO2 24 26  GLUCOSE 95 93  BUN 10 11  CREATININE 0.91 0.81  CALCIUM 10.3 9.2   Liver Function Tests: Recent Labs  Lab 09/11/20 0454  AST 37  ALT 23  ALKPHOS 77  BILITOT 0.6  PROT 5.5*  ALBUMIN 2.8*   No results for input(s): LIPASE, AMYLASE in the last 168 hours. No results for input(s): AMMONIA in the last 168 hours. CBC: Recent Labs  Lab 09/09/20 0800 09/10/20 1046 09/11/20 0454  WBC 22.8* 16.6* 14.6*  NEUTROABS 20.5*  --   --   HGB 14.5 11.0* 10.9*  HCT 46.7* 35.5* 33.3*  MCV 98.5 98.3 97.9  PLT 354 281 272   Cardiac Enzymes: No results for input(s): CKTOTAL, CKMB, CKMBINDEX, TROPONINI in the last 168 hours. BNP: Invalid input(s): POCBNP CBG: No results for input(s): GLUCAP in the last 168 hours. D-Dimer No results for input(s): DDIMER in the last 72 hours. Hgb A1c No results for input(s): HGBA1C in the last 72 hours. Lipid Profile No results for input(s): CHOL, HDL, LDLCALC, TRIG, CHOLHDL, LDLDIRECT in the last 72 hours. Thyroid function studies No results  for input(s): TSH, T4TOTAL, T3FREE, THYROIDAB in the last 72 hours.  Invalid input(s): FREET3 Anemia work up No results for input(s): VITAMINB12, FOLATE, FERRITIN,  TIBC, IRON, RETICCTPCT in the last 72 hours. Urinalysis    Component Value Date/Time   COLORURINE YELLOW 01/23/2020 1522   APPEARANCEUR CLEAR 01/23/2020 1522   LABSPEC 1.012 01/23/2020 1522   PHURINE 5.0 01/23/2020 1522   GLUCOSEU NEGATIVE 01/23/2020 1522   HGBUR NEGATIVE 01/23/2020 1522   BILIRUBINUR NEGATIVE 01/23/2020 1522   KETONESUR NEGATIVE 01/23/2020 1522   PROTEINUR NEGATIVE 01/23/2020 1522   UROBILINOGEN 0.2 05/24/2013 1634   NITRITE POSITIVE (A) 01/23/2020 1522   LEUKOCYTESUR SMALL (A) 01/23/2020 1522   Sepsis Labs Invalid input(s): PROCALCITONIN,  WBC,  LACTICIDVEN Microbiology Recent Results (from the past 240 hour(s))  MRSA PCR Screening     Status: None   Collection Time: 09/09/20 12:10 AM   Specimen: Nasopharyngeal  Result Value Ref Range Status   MRSA by PCR NEGATIVE NEGATIVE Final    Comment:        The GeneXpert MRSA Assay (FDA approved for NASAL specimens only), is one component of a comprehensive MRSA colonization surveillance program. It is not intended to diagnose MRSA infection nor to guide or monitor treatment for MRSA infections. Performed at [facility], [place] [address], [place], [place] [address]   Resp Panel by RT-PCR (Flu A&B, Covid) Nasopharyngeal Swab     Status: None   Collection Time: 09/09/20  8:00 AM   Specimen: Nasopharyngeal Swab; Nasopharyngeal(NP) swabs in vial transport medium  Result Value Ref Range Status   SARS Coronavirus 2 by RT PCR NEGATIVE NEGATIVE Final    Comment: (NOTE) SARS-CoV-2 target nucleic acids are NOT DETECTED.  The SARS-CoV-2 RNA is generally detectable in upper respiratory specimens during the acute phase of infection. The lowest concentration of SARS-CoV-2 viral copies this assay can detect is 138 copies/mL. A negative result does not preclude SARS-Cov-2 infection and should not be used as the sole basis for treatment or other patient management decisions. A negative result may occur with  improper  specimen collection/handling, submission of specimen other than nasopharyngeal swab, presence of viral mutation(s) within the areas targeted by this assay, and inadequate number of viral copies(<138 copies/mL). A negative result must be combined with clinical observations, patient history, and epidemiological information. The expected result is Negative.  Fact Sheet for Patients:  EntrepreneurPulse.com.au  Fact Sheet for Healthcare Providers:  IncredibleEmployment.be  This test is no t yet approved or cleared by the [place] FDA and  has been authorized for detection and/or diagnosis of SARS-CoV-2 by FDA under an Emergency Use Authorization (EUA). This EUA will remain  in effect (meaning this test can be used) for the duration of the COVID-19 declaration under Section 564(b)(1) of the Act, 21 U.S.C.section 360bbb-3(b)(1), unless the authorization is terminated  or revoked sooner.       Influenza A by PCR NEGATIVE NEGATIVE Final   Influenza B by PCR NEGATIVE NEGATIVE Final    Comment: (NOTE) The Xpert Xpress SARS-CoV-2/FLU/RSV plus assay is intended as an aid in the diagnosis of influenza from Nasopharyngeal swab specimens and should not be used as a sole basis for treatment. Nasal washings and aspirates are unacceptable for Xpert Xpress SARS-CoV-2/FLU/RSV testing.  Fact Sheet for Patients: EntrepreneurPulse.com.au  Fact Sheet for Healthcare Providers: IncredibleEmployment.be  This test is not yet approved or cleared by the [place] FDA and has been authorized for detection and/or diagnosis of SARS-CoV-2 by FDA  under an Emergency Use Authorization (EUA). This EUA will remain in effect (meaning this test can be used) for the duration of the COVID-19 declaration under Section 564(b)(1) of the Act, 21 U.S.C. section 360bbb-3(b)(1), unless the authorization is terminated or revoked.  Performed at  [facility], [place] [address], [place], [place] [address]   Blood culture (routine x 2)     Status: None (Preliminary result)   Collection Time: 09/09/20  9:19 AM   Specimen: BLOOD RIGHT FOREARM  Result Value Ref Range Status   Specimen Description BLOOD RIGHT FOREARM  Final   Special Requests   Final    BOTTLES DRAWN AEROBIC ONLY Blood Culture results may not be optimal due to an inadequate volume of blood received in culture bottles   Culture   Final    NO GROWTH 2 DAYS Performed at [facility], [place] [address] [place], [place] [address]    Report Status PENDING  Incomplete  Blood culture (routine x 2)     Status: None (Preliminary result)   Collection Time: 09/09/20  9:20 AM   Specimen: BLOOD  Result Value Ref Range Status   Specimen Description BLOOD LEFT ANTECUBITAL  Final   Special Requests   Final    BOTTLES DRAWN AEROBIC AND ANAEROBIC Blood Culture results may not be optimal due to an inadequate volume of blood received in culture bottles   Culture   Final    NO GROWTH 2 DAYS Performed at [facility], [place] [address] [place], [place] [address]    Report Status PENDING  Incomplete   Time spent: 82min  SIGNED:   Marylu Lund, MD  [facility] 09/11/2020, 10:14 AM  If 7PM-7AM, please contact night-coverage

## 2020-09-10 NOTE — Progress Notes (Signed)
Modified Barium Swallow Progress Note  Patient Details  Name: EMRIE GAYLE MRN: [id number] Date of Birth: [birth year]  Today's Date: 09/10/2020  Modified Barium Swallow completed.  Full report located under Chart Review in the Imaging Section.  Brief recommendations include the following:  Clinical Impression  Pt was seen in radiology suite for modified barium swallow study. Trials of puree solids, regular texture solids, a 64mm barium tablet, and thin liquids via cup and straw were administered. Pt's oropharyngeal swallow mechanism was within limits without any instances of penetration/aspiration and adequate oropharyngeal clearance. Further skilled SLP services are not clinically indicated at this time.   Swallow Evaluation Recommendations       SLP Diet Recommendations: Regular solids;Thin liquid   Liquid Administration via: Cup;Straw   Medication Administration: Whole meds with liquid   Supervision: Patient able to self feed       Postural Changes: Seated upright at 90 degrees   Oral Care Recommendations: Oral care BID      Edin Skarda I. Hardin Negus, [place], [place] Office number [phone] Pager [place] 09/10/2020,3:22 PM

## 2020-09-10 NOTE — Evaluation (Signed)
Physical Therapy Evaluation Patient Details Name: Katrina Burnett MRN: [id number] DOB: 1925-03-10 Today's Date: 09/10/2020   History of Present Illness  Pt is a 87 y.o. female admitted from [facility] on 09/09/20 with mild SOB, fall from commode due to weakness. Imaging negative for acute injury. Workup for RLL PNA. PMH includes CVA, chronic pain, depression, anxiety.    Clinical Impression  Pt presents with an overall decrease in functional mobility secondary to above. PTA, pt resident at [facility], ambulator with rollator, intermittent assist for ADL tasks. Today, pt able to transfer and ambulate short distance with RW and min guard for balance. SpO2 >/94% on RA, no SOB noted with activity. Pt would benefit from continued acute PT services to maximize functional mobility and independence prior to return to ALF with continued assist and HHPT services.     Follow Up Recommendations Home health PT;Supervision for mobility/OOB (at ALF)    Equipment Recommendations  None recommended by PT    Recommendations for Other Services       Precautions / Restrictions Precautions Precautions: Fall Restrictions Weight Bearing Restrictions: No      Mobility  Bed Mobility Overal bed mobility: Needs Assistance Bed Mobility: Supine to Sit;Sit to Supine     Supine to sit: Supervision;HOB elevated Sit to supine: Supervision   General bed mobility comments: Increased time, no physical assist required    Transfers Overall transfer level: Needs assistance Equipment used: Rolling walker (2 wheeled) Transfers: Sit to/from Stand Sit to Stand: Min guard         General transfer comment: Initially attempting to stand by pulling on RW and unable, able to self-correct to UE support pushing from bed and able to stand without physical assist, min guard for balance  Ambulation/Gait Ambulation/Gait assistance: Min guard;Min assist Gait Distance (Feet): 32 Feet Assistive device: Rolling walker  (2 wheeled) Gait Pattern/deviations: Step-through pattern;Decreased stride length;Trunk flexed Gait velocity: Decreased   General Gait Details: Slow, mostly steady gait with RW and min guard for balance; pt drifting towards L-side (question if pt typically uses rollator due to apparent difficulty with turning?) requiring verbal cues to correct walker course, light minA to navigate RW wheel when stuck on object; verbal cues for upright posture; pt declines further distance secondary to fatigue  Stairs            Wheelchair Mobility    Modified Rankin (Stroke Patients Only)       Balance Overall balance assessment: Needs assistance Sitting-balance support: No upper extremity supported;Feet unsupported;Feet supported Sitting balance-Leahy Scale: Good     Standing balance support: Bilateral upper extremity supported;Single extremity supported;During functional activity Standing balance-Leahy Scale: Poor Standing balance comment: Reliant on UE support                             Pertinent Vitals/Pain Pain Assessment: Faces Pain Score: 5  Faces Pain Scale: Hurts a little bit Pain Location: Neck, buttocks Pain Descriptors / Indicators: Discomfort;Sore Pain Intervention(s): Repositioned;Premedicated before session;Other (comment) (cervical AROM)    Home Living Family/patient expects to be discharged to:: Assisted living               Home Equipment: Walker - 2 wheels;Grab bars - tub/shower;Shower seat Additional Comments: Resident at [facility]    Prior Function Level of Independence: Needs assistance   Gait / Transfers Assistance Needed: Pt reports ambulatory with walker "all the time." Reports enjoys watching TV  ADL's /  Homemaking Assistance Needed: Assist to bathe 3x/wk        Hand Dominance        Extremity/Trunk Assessment   Upper Extremity Assessment Upper Extremity Assessment: Generalized weakness    Lower Extremity  Assessment Lower Extremity Assessment: Generalized weakness    Cervical / Trunk Assessment Cervical / Trunk Assessment: Kyphotic  Communication   Communication: No difficulties  Cognition Arousal/Alertness: Awake/alert Behavior During Therapy: WFL for tasks assessed/performed Overall Cognitive Status: No family/caregiver present to determine baseline cognitive functioning                                 General Comments: Pt following simple commands appropriately, pleasant and agreeable. Apparent decreased short-term memory and attention deficits noted; suspect pt near baseline cognition      General Comments General comments (skin integrity, edema, etc.): SpO2 >/94% on RA, no SOB noted    Exercises Other Exercises Other Exercises: Cervical AROM - flex/ext, rotation, lateral flexion; trunk extension while standing   Assessment/Plan    PT Assessment Patient needs continued PT services  PT Problem List Decreased strength;Decreased activity tolerance;Decreased balance;Decreased mobility;Cardiopulmonary status limiting activity;Pain       PT Treatment Interventions DME instruction;Gait training;Functional mobility training;Therapeutic activities;Therapeutic exercise;Balance training;Patient/family education    PT Goals (Current goals can be found in the Care Plan section)  Acute Rehab PT Goals Patient Stated Goal: Decreased pain PT Goal Formulation: With patient Time For Goal Achievement: 09/24/20 Potential to Achieve Goals: Good    Frequency Min 3X/week   Barriers to discharge        Co-evaluation               AM-PAC PT "6 Clicks" Mobility  Outcome Measure Help needed turning from your back to your side while in a flat bed without using bedrails?: None Help needed moving from lying on your back to sitting on the side of a flat bed without using bedrails?: A Little Help needed moving to and from a bed to a chair (including a wheelchair)?: A  Little Help needed standing up from a chair using your arms (e.g., wheelchair or bedside chair)?: A Little Help needed to walk in hospital room?: A Little Help needed climbing 3-5 steps with a railing? : A Little 6 Click Score: 19    End of Session Equipment Utilized During Treatment: Gait belt Activity Tolerance: Patient tolerated treatment well Patient left: in bed;with call bell/phone within reach;with bed alarm set Nurse Communication: Mobility status PT Visit Diagnosis: Other abnormalities of gait and mobility (R26.89);Muscle weakness (generalized) (M62.81)    Time: 9485-4627 PT Time Calculation (min) (ACUTE ONLY): 19 min   Charges:   PT Evaluation $PT Eval Moderate Complexity: Crooks, PT, DPT Acute Rehabilitation Services  Pager [phone] Office [place] 09/10/2020, 10:18 AM

## 2020-09-10 NOTE — NC FL2 (Signed)
[place] LEVEL OF CARE SCREENING TOOL     IDENTIFICATION  Patient Name: Katrina Burnett Birthdate: 1924/07/17 Sex: female Admission Date (Current Location): 09/09/2020  County and [place] Number:  Herbalist and Address:  [facility], [employer] [address], [place], [place] [address]      Provider Number: [id number]  Attending Physician Name and Address:  Donne Hazel, MD  Relative Name and Phone Number:  Charissa Bash Daughter [phone]  [phone]    Current Level of Care: Hospital Recommended Level of Care: [place] Prior Approval Number:    Date Approved/Denied:   PASRR Number:    Discharge Plan: Other (Comment) (ALF)    Current Diagnoses: Patient Active Problem List   Diagnosis Date Noted  . CAP (community acquired pneumonia) 09/09/2020  . Malnutrition ([place]) 09/09/2020  . Chronic pain disorder 09/09/2020  . Glaucoma 09/09/2020  . DNR (do not resuscitate) 09/09/2020  . Acute respiratory failure with hypoxia ([place]) 01/24/2020  . Acute hypoxemic respiratory failure ([place]) 01/23/2020  . Depression with anxiety   . Thrombocytosis   . GIB (gastrointestinal bleeding) 11/21/2019  . Esophageal mass 11/19/2019  . Hiatal hernia   . Esophagitis   . Pelvic fracture ([place]) 11/11/2019  . Pneumothorax on left 10/24/2018  . Pneumothorax, left 10/24/2018  . Acute on chronic anemia 11/25/2016  . Hyponatremia 11/25/2016  . Fracture, intertrochanteric, right femur ([place]) 05/26/2015  . Closed fracture of femur ([place])   . Acute blood loss anemia   . Adjustment disorder with mixed anxiety and depressed mood   . Fall   . Abnormality of gait   . Femur fracture, right, closed, initial encounter 05/23/2015  . Fracture of femoral neck, right, closed ([place]) 05/22/2015  . Femoral neck fracture, right, closed, initial encounter 05/22/2015  . Syncope 05/30/2012  . Nausea & vomiting 05/30/2012  . Chest pain 05/30/2012  .  Hypotension 05/30/2012  . GERD (gastroesophageal reflux disease) 12/16/2011  . Lumbar compression fracture ([place])   . Polymyalgia ([place])     Orientation RESPIRATION BLADDER Height & Weight     Self  Normal Continent Weight: 92 lb (41.7 kg) Height:  5' (152.4 cm)  BEHAVIORAL SYMPTOMS/MOOD NEUROLOGICAL BOWEL NUTRITION STATUS      Continent Diet (regular diet.  See discharge summary)  AMBULATORY STATUS COMMUNICATION OF NEEDS Skin   Limited Assist Verbally Normal                       Personal Care Assistance Level of Assistance  Bathing,Feeding,Dressing Bathing Assistance: Limited assistance Feeding assistance: Independent Dressing Assistance: Limited assistance     Functional Limitations Info  Sight,Hearing,Speech Sight Info: Adequate Hearing Info: Adequate Speech Info: Adequate    SPECIAL CARE FACTORS FREQUENCY  PT (By licensed PT),OT (By licensed OT)     PT Frequency: 5x week OT Frequency: 5x week            Contractures Contractures Info: Not present    Additional Factors Info  Code Status,Allergies Code Status Info: DNR Allergies Info: Minocycline, Paxil (Paroxetine Hcl), Sulfa Antibiotics, Amoxicillin, Bupropion Hcl Er (Sr), Cyclobenzaprine, Mirtazapine, Vilazodone Hcl, Avelox (Moxifloxacin Hcl In Nacl), Cefuroxime, Clarithromycin, Levaquin (Levofloxacin In D5w), Nystatin-triamcinolone, Penicillins, Prednisone           Current Medications (09/10/2020):  This is the current hospital active medication list Current Facility-Administered Medications  Medication Dose Route Frequency Provider Last Rate Last Admin  . 0.9 %  sodium chloride  infusion   Intravenous Continuous Karmen Bongo, MD 75 mL/hr at 09/09/20 1204 New Bag at 09/09/20 1204  . acetaminophen (TYLENOL) tablet 650 mg  650 mg Oral Q6H PRN Karmen Bongo, MD   650 mg at 09/09/20 1616   Or  . acetaminophen (TYLENOL) suppository 650 mg  650 mg Rectal Q6H PRN Karmen Bongo, MD      . albuterol  (PROVENTIL) (2.5 MG/3ML) 0.083% nebulizer solution 2.5 mg  2.5 mg Nebulization Q2H PRN Karmen Bongo, MD      . azithromycin (ZITHROMAX) 500 mg in sodium chloride 0.9 % 250 mL IVPB  500 mg Intravenous Q24H Karmen Bongo, MD 250 mL/hr at 09/10/20 1019 500 mg at 09/10/20 1019  . bisacodyl (DULCOLAX) EC tablet 5 mg  5 mg Oral Daily PRN Karmen Bongo, MD      . cefTRIAXone (ROCEPHIN) 2 g in sodium chloride 0.9 % 100 mL IVPB  2 g Intravenous Q24H Karmen Bongo, MD 200 mL/hr at 09/10/20 0853 2 g at 09/10/20 0853  . citalopram (CELEXA) tablet 20 mg  20 mg Oral Daily Karmen Bongo, MD   20 mg at 09/10/20 0851  . docusate sodium (COLACE) capsule 100 mg  100 mg Oral BID Karmen Bongo, MD   100 mg at 09/10/20 0850  . dorzolamide (TRUSOPT) 2 % ophthalmic solution 1 drop  1 drop Both Eyes BID Karmen Bongo, MD   1 drop at 09/10/20 1020  . enoxaparin (LOVENOX) injection 30 mg  30 mg Subcutaneous Q24H Karmen Bongo, MD   30 mg at 09/09/20 1616  . feeding supplement (ENSURE ENLIVE / ENSURE PLUS) liquid 237 mL  237 mL Oral BID BM Donne Hazel, MD   237 mL at 09/10/20 1412  . gabapentin (NEURONTIN) capsule 600 mg  600 mg Oral Ivery Quale, MD   600 mg at 09/09/20 2149  . guaiFENesin (MUCINEX) 12 hr tablet 600 mg  600 mg Oral BID PRN Karmen Bongo, MD      . hydrALAZINE (APRESOLINE) injection 5 mg  5 mg Intravenous Q4H PRN Karmen Bongo, MD      . latanoprost (XALATAN) 0.005 % ophthalmic solution 1 drop  1 drop Both Eyes QHS Karmen Bongo, MD   1 drop at 09/09/20 2152  . LORazepam (ATIVAN) tablet 2 mg  2 mg Oral TID Karmen Bongo, MD   2 mg at 09/10/20 0850  . morphine 2 MG/ML injection 2 mg  2 mg Intravenous Q2H PRN Karmen Bongo, MD   2 mg at 09/09/20 1443  . multivitamin with minerals tablet 1 tablet  1 tablet Oral Daily Donne Hazel, MD   1 tablet at 09/10/20 1046  . ondansetron (ZOFRAN) tablet 4 mg  4 mg Oral Q6H PRN Karmen Bongo, MD       Or  . ondansetron [facility])  injection 4 mg  4 mg Intravenous Q6H PRN Karmen Bongo, MD      . oxyCODONE-acetaminophen (PERCOCET/ROXICET) 5-325 MG per tablet 1 tablet  1 tablet Oral Q6H PRN Karmen Bongo, MD   1 tablet at 09/10/20 1027   And  . oxyCODONE (Oxy IR/ROXICODONE) immediate release tablet 5 mg  5 mg Oral Q6H PRN Karmen Bongo, MD      . pantoprazole (PROTONIX) EC tablet 40 mg  40 mg Oral Daily Karmen Bongo, MD   40 mg at 09/10/20 0851  . polyethylene glycol (MIRALAX / GLYCOLAX) packet 17 g  17 g Oral Daily PRN Karmen Bongo, MD      .  saccharomyces boulardii (FLORASTOR) capsule 250 mg  250 mg Oral BID Karmen Bongo, MD   250 mg at 09/10/20 0851  . traZODone (DESYREL) tablet 50 mg  50 mg Oral QHS Karmen Bongo, MD   50 mg at 09/09/20 2149     Discharge Medications: Please see discharge summary for a list of discharge medications.  Relevant Imaging Results:  Relevant Lab Results:   Additional Information SSN [id number]  Joanne Chars, LCSW

## 2020-09-10 NOTE — Care Management CC44 (Addendum)
Condition Code 44 Documentation Completed  Patient Details  Name: Katrina Burnett MRN: [id number] Date of Birth: March 25, 1925   Condition Code 44 given:  Yes Patient signature on Condition Code 44 notice:  Yes Documentation of 2 MD's agreement:  Yes Code 44 added to claim:  Yes   Patient not able to participate in this explanation.  CSW spoke with patient daughter Katrina Burnett and explained the Code 20.    Joanne Chars, LCSW 09/10/2020, 2:43 PM

## 2020-09-10 NOTE — Progress Notes (Signed)
PROGRESS NOTE    Katrina Burnett  CZY:[id number] DOB: [birth year] DOA: 09/09/2020 PCP: [name], DO    Brief Narrative:  87 y.o. female with medical history significant of CVA; PMR; depression/anxiety; and chronic pain presenting with fall.  She reports that she felt mildly SOB during the day yesterday.  She got up to use the bathroom overnight and was too weak to lift herself off the commode and so she eventually slid to the floor.  She denies cough.  No known sick contacts  Assessment & Plan:   Principal Problem:   CAP (community acquired pneumonia) Active Problems:   Depression with anxiety   Malnutrition ([place])   Chronic pain disorder   Glaucoma   DNR (do not resuscitate)  CAP without sepsis -Patient presenting with weakness, SOB, mildly decreased oxygen saturation, and infiltrate in right lower lobe on chest x-ray -Pt had been continued on azithromycin and rocephin -Pt reports feeling improved. WBC has improved from 22k to 16k -Seen by PT, recommendation for HHPT  Malnutrition/Frailty -Body mass index is 17.97 kg/m..  -The patient has at least 2 indicators for malnutrition (insufficient energy intake, weight loss, loss of muscle mass, loss of subcutaneous fat -Seen by SLP, cleared for regular diet -Seen by PT, recs for HHPT  Chronic pain -Seems to be stable at this time -Continue Percocet, Flexeril, Neurontin  Depression/anxiety -Continue Celexa, Ativan, Trazodone  Glaucoma -Continue Trusopt, Xalatan  DNR -Noted at time of presentation   DVT prophylaxis: Lovenox subq Code Status: DNR Family Communication: Pt in room, family not at bedside  Status is: Observation  The patient remains OBS appropriate and will d/c before 2 midnights.  Dispo: The patient is from: ILF              Anticipated d/c is to: ILF              Patient currently is not medically stable to d/c.   Difficult to place patient No   Consultants:     Procedures:      Antimicrobials: Anti-infectives (From admission, onward)   Start     Dose/Rate Route Frequency Ordered Stop   09/10/20 1000  azithromycin (ZITHROMAX) 500 mg in sodium chloride 0.9 % 250 mL IVPB        500 mg 250 mL/hr over 60 Minutes Intravenous Every 24 hours 09/09/20 1041 09/14/20 0959   09/10/20 0700  cefTRIAXone (ROCEPHIN) 2 g in sodium chloride 0.9 % 100 mL IVPB        2 g 200 mL/hr over 30 Minutes Intravenous Every 24 hours 09/09/20 1041 09/14/20 0659   09/10/20 0000  azithromycin (ZITHROMAX) 250 MG tablet           09/10/20 1232     09/10/20 0000  cefdinir (OMNICEF) 300 MG capsule  Status:  Discontinued       Note to Pharmacy: Tolerated rocephin   300 mg Oral 2 times daily 09/10/20 1232 09/10/20    09/10/20 0000  cefdinir (OMNICEF) 300 MG capsule       Note to Pharmacy: Tolerated rocephin   300 mg Oral Daily 09/10/20 1441 09/15/20 2359   09/09/20 0900  cefTRIAXone (ROCEPHIN) 1 g in sodium chloride 0.9 % 100 mL IVPB        1 g 200 mL/hr over 30 Minutes Intravenous  Once 09/09/20 0856 09/09/20 1006   09/09/20 0900  azithromycin (ZITHROMAX) 500 mg in sodium chloride 0.9 % 250 mL IVPB  500 mg 250 mL/hr over 60 Minutes Intravenous  Once 09/09/20 0856 09/09/20 1131       Subjective: Feeling better but still very weak  Objective: Vitals:   09/09/20 2158 09/10/20 0547 09/10/20 0800 09/10/20 1023  BP: 140/79 (!) 158/91 (!) 155/76 120/68  Pulse: 79 73    Resp: 18 18    Temp: 99.5 F (37.5 C) 98.6 F (37 C)    TempSrc: Oral Oral    SpO2: 97% 100%    Weight:      Height:        Intake/Output Summary (Last 24 hours) at 09/10/2020 1526 Last data filed at 09/10/2020 2542 Gross per 24 hour  Intake 342.03 ml  Output 250 ml  Net 92.03 ml   Filed Weights   09/09/20 0755  Weight: 41.7 kg    Examination: General exam: Awake, laying in bed, in nad Respiratory system: Normal respiratory effort, no wheezing Cardiovascular system: regular rate, s1,  s2 Gastrointestinal system: Soft, nondistended, positive BS Central nervous system: CN2-12 grossly intact, strength intact Extremities: Perfused, no clubbing Skin: Normal skin turgor, no notable skin lesions seen Psychiatry: Mood normal // no visual hallucinations   Data Reviewed: I have personally reviewed following labs and imaging studies  CBC: Recent Labs  Lab 09/09/20 0800 09/10/20 1046  WBC 22.8* 16.6*  NEUTROABS 20.5*  --   HGB 14.5 11.0*  HCT 46.7* 35.5*  MCV 98.5 98.3  PLT 354 706   Basic Metabolic Panel: Recent Labs  Lab 09/09/20 0800  NA 136  K 4.1  CL 100  CO2 24  GLUCOSE 95  BUN 10  CREATININE 0.91  CALCIUM 10.3   GFR: Estimated Creatinine Clearance: 24.9 mL/min (by C-G formula based on SCr of 0.91 mg/dL). Liver Function Tests: No results for input(s): AST, ALT, ALKPHOS, BILITOT, PROT, ALBUMIN in the last 168 hours. No results for input(s): LIPASE, AMYLASE in the last 168 hours. No results for input(s): AMMONIA in the last 168 hours. Coagulation Profile: No results for input(s): INR, PROTIME in the last 168 hours. Cardiac Enzymes: No results for input(s): CKTOTAL, CKMB, CKMBINDEX, TROPONINI in the last 168 hours. BNP (last 3 results) No results for input(s): PROBNP in the last 8760 hours. HbA1C: No results for input(s): HGBA1C in the last 72 hours. CBG: No results for input(s): GLUCAP in the last 168 hours. Lipid Profile: No results for input(s): CHOL, HDL, LDLCALC, TRIG, CHOLHDL, LDLDIRECT in the last 72 hours. Thyroid Function Tests: No results for input(s): TSH, T4TOTAL, FREET4, T3FREE, THYROIDAB in the last 72 hours. Anemia Panel: No results for input(s): VITAMINB12, FOLATE, FERRITIN, TIBC, IRON, RETICCTPCT in the last 72 hours. Sepsis Labs: Recent Labs  Lab 09/09/20 0800 09/09/20 0857  PROCALCITON 0.44  --   LATICACIDVEN  --  1.8    Recent Results (from the past 240 hour(s))  Resp Panel by RT-PCR (Flu A&B, Covid) Nasopharyngeal Swab      Status: None   Collection Time: 09/09/20  8:00 AM   Specimen: Nasopharyngeal Swab; Nasopharyngeal(NP) swabs in vial transport medium  Result Value Ref Range Status   SARS Coronavirus 2 by RT PCR NEGATIVE NEGATIVE Final    Comment: (NOTE) SARS-CoV-2 target nucleic acids are NOT DETECTED.  The SARS-CoV-2 RNA is generally detectable in upper respiratory specimens during the acute phase of infection. The lowest concentration of SARS-CoV-2 viral copies this assay can detect is 138 copies/mL. A negative result does not preclude SARS-Cov-2 infection and should not be used as the sole  basis for treatment or other patient management decisions. A negative result may occur with  improper specimen collection/handling, submission of specimen other than nasopharyngeal swab, presence of viral mutation(s) within the areas targeted by this assay, and inadequate number of viral copies(<138 copies/mL). A negative result must be combined with clinical observations, patient history, and epidemiological information. The expected result is Negative.  Fact Sheet for Patients:  EntrepreneurPulse.com.au  Fact Sheet for Healthcare Providers:  IncredibleEmployment.be  This test is no t yet approved or cleared by the [place] FDA and  has been authorized for detection and/or diagnosis of SARS-CoV-2 by FDA under an Emergency Use Authorization (EUA). This EUA will remain  in effect (meaning this test can be used) for the duration of the COVID-19 declaration under Section 564(b)(1) of the Act, 21 U.S.C.section 360bbb-3(b)(1), unless the authorization is terminated  or revoked sooner.       Influenza A by PCR NEGATIVE NEGATIVE Final   Influenza B by PCR NEGATIVE NEGATIVE Final    Comment: (NOTE) The Xpert Xpress SARS-CoV-2/FLU/RSV plus assay is intended as an aid in the diagnosis of influenza from Nasopharyngeal swab specimens and should not be used as a sole basis  for treatment. Nasal washings and aspirates are unacceptable for Xpert Xpress SARS-CoV-2/FLU/RSV testing.  Fact Sheet for Patients: EntrepreneurPulse.com.au  Fact Sheet for Healthcare Providers: IncredibleEmployment.be  This test is not yet approved or cleared by the [place] FDA and has been authorized for detection and/or diagnosis of SARS-CoV-2 by FDA under an Emergency Use Authorization (EUA). This EUA will remain in effect (meaning this test can be used) for the duration of the COVID-19 declaration under Section 564(b)(1) of the Act, 21 U.S.C. section 360bbb-3(b)(1), unless the authorization is terminated or revoked.  Performed at [facility], [place] [address], [place], [place] [address]   Blood culture (routine x 2)     Status: None (Preliminary result)   Collection Time: 09/09/20  9:19 AM   Specimen: BLOOD RIGHT FOREARM  Result Value Ref Range Status   Specimen Description BLOOD RIGHT FOREARM  Final   Special Requests   Final    BOTTLES DRAWN AEROBIC ONLY Blood Culture results may not be optimal due to an inadequate volume of blood received in culture bottles   Culture   Final    NO GROWTH < 24 HOURS Performed at [facility], [place] [address] [place], [place] [address]    Report Status PENDING  Incomplete  Blood culture (routine x 2)     Status: None (Preliminary result)   Collection Time: 09/09/20  9:20 AM   Specimen: BLOOD  Result Value Ref Range Status   Specimen Description BLOOD LEFT ANTECUBITAL  Final   Special Requests   Final    BOTTLES DRAWN AEROBIC AND ANAEROBIC Blood Culture results may not be optimal due to an inadequate volume of blood received in culture bottles   Culture   Final    NO GROWTH < 24 HOURS Performed at [facility], [place] [address] [place],  [address]    Report Status PENDING  Incomplete     Radiology Studies: DG Chest 1 View  Result Date: 09/09/2020 CLINICAL DATA:   Recent fall with left hip pain, initial encounter EXAM: CHEST  1 VIEW COMPARISON:  07/24/2020 FINDINGS: Cardiac shadow is stable. Aortic calcifications are again seen. Changes of prior vertebral augmentation are noted at multiple levels. Increased right basilar airspace opacity is noted consistent with acute infiltrate. No  effusion or pneumothorax is seen. No other focal abnormality is noted. IMPRESSION: New right basilar infiltrate. Electronically Signed   By: Inez Catalina M.D.   On: 09/09/2020 08:34   CT Head Wo Contrast  Result Date: 09/09/2020 CLINICAL DATA:  Unwitnessed fall this morning. Weakness since awakening this morning. EXAM: CT HEAD WITHOUT CONTRAST TECHNIQUE: Contiguous axial images were obtained from the base of the skull through the vertex without intravenous contrast. COMPARISON:  07/24/2020. FINDINGS: Brain: No evidence of acute infarction, hemorrhage, hydrocephalus, extra-axial collection or mass lesion/mass effect. Age related ventricular sulcal enlargement, old right basal ganglia lacunar infarct and patchy white matter hypoattenuation consistent with microvascular ischemic change, all stable. Vascular: No hyperdense vessel or unexpected calcification. Skull: Normal. Negative for fracture or focal lesion. Sinuses/Orbits: Globes and orbits are unremarkable. Sinuses are clear. Other: None. IMPRESSION: No acute intracranial abnormalities. Stable appearance from the prior exam. Electronically Signed   By: Lajean Manes M.D.   On: 09/09/2020 08:45   DG Swallowing Func-Speech Pathology  Result Date: 09/10/2020 Objective Swallowing Evaluation: Type of Study: MBS-Modified Barium Swallow Study  Patient Details Name: Katrina Burnett MRN: [id number] Date of Birth: 25-Sep-1924 Today's Date: 09/10/2020 Time: SLP Start Time (ACUTE ONLY): 1501 -SLP Stop Time (ACUTE ONLY): 1516 SLP Time Calculation (min) (ACUTE ONLY): 15 min Past Medical History: Past Medical History: Diagnosis Date . Allergic rhinitis  . Anxiety   . Arthritis   "hands" (11/25/2016) . Barrett's esophagus 06/1997 . CAP (community acquired pneumonia) 11/25/2016  "this is my 3rd time having pneumonia" (11/25/2016) . Chondromalacia  . Chronic lower back pain  . Depression  . Diverticulosis  . GERD (gastroesophageal reflux disease)  . Glaucoma  . History of hiatal hernia  . History of stomach ulcers  . Lumbar compression fracture (HCC)   L1 and L2 . Osteoporosis  . Pneumonia  . Polymyalgia ([place])  . Stroke ([place])  . Tubular adenoma of colon 06/1999 . Vaginitis  . Varicose veins  Past Surgical History: Past Surgical History: Procedure Laterality Date . BACK SURGERY   . BIOPSY  11/19/2019  Procedure: BIOPSY;  Surgeon: Gatha Mayer, MD;  Location: [facility];  Service: Endoscopy;; . CATARACT EXTRACTION W/ INTRAOCULAR LENS  IMPLANT, BILATERAL Bilateral  . ESOPHAGOGASTRODUODENOSCOPY (EGD) WITH PROPOFOL N/A 11/19/2019  Procedure: ESOPHAGOGASTRODUODENOSCOPY (EGD) WITH PROPOFOL;  Surgeon: Gatha Mayer, MD;  Location: [employer];  Service: Endoscopy;  Laterality: N/A; . FIXATION KYPHOPLASTY LUMBAR SPINE  10/12/10; 09/20/11 . FRACTURE SURGERY   . IR KYPHO THORACIC WITH BONE BIOPSY  03/24/2018 . IR VERTEBROPLASTY CERV/THOR BX INC UNI/BIL INC/INJECT/IMAGING  03/24/2018 . LUMBAR SPINE SURGERY    "did OR on 2 fractures in my lower back" . LUNG BIOPSY   . ORIF HIP FRACTURE Right 05/23/2015  Procedure: OPEN REDUCTION INTERNAL FIXATION HIP;  Surgeon: Frederik Pear, MD;  Location: [facility];  Service: Orthopedics;  Laterality: Right; Marland Kitchen VARICOSE VEIN SURGERY    vein ligation and stripping "years ago" HPI: Pt is a 87 y.o. female with medical history significant for CVA, PMR, depression/anxiety, reflux, and chronic pain who presented with fall. CT head 5/3 was negative for acute changes. CXR 5/3: Increased right basilar airspace opacity is noted consistent with acute infiltrate.  No data recorded Assessment / Plan / Recommendation CHL IP CLINICAL IMPRESSIONS 09/10/2020 Clinical Impression Pt  was seen in radiology suite for modified barium swallow study. Trials of puree solids, regular texture solids, a 2mm barium tablet, and thin liquids via cup and straw were administered. Pt's oropharyngeal  swallow mechanism was within limits without any instances of penetration/aspiration and adequate oropharyngeal clearance. Further skilled SLP services are not clinically indicated at this time. SLP Visit Diagnosis Dysphagia, pharyngeal phase (R13.13) Attention and concentration deficit following -- Frontal lobe and executive function deficit following -- Impact on safety and function Mild aspiration risk   CHL IP TREATMENT RECOMMENDATION 09/10/2020 Treatment Recommendations No treatment recommended at this time   Prognosis 09/10/2020 Prognosis for Safe Diet Advancement Good Barriers to Reach Goals -- Barriers/Prognosis Comment -- CHL IP DIET RECOMMENDATION 09/10/2020 SLP Diet Recommendations Regular solids;Thin liquid Liquid Administration via Cup;Straw Medication Administration Whole meds with liquid Compensations -- Postural Changes Seated upright at 90 degrees   CHL IP OTHER RECOMMENDATIONS 09/10/2020 Recommended Consults -- Oral Care Recommendations Oral care BID Other Recommendations --   CHL IP FOLLOW UP RECOMMENDATIONS 09/10/2020 Follow up Recommendations None   CHL IP FREQUENCY AND DURATION 09/10/2020 Speech Therapy Frequency (ACUTE ONLY) min 2x/week Treatment Duration 1 week      CHL IP ORAL PHASE 09/10/2020 Oral Phase WFL Oral - Pudding Teaspoon -- Oral - Pudding Cup -- Oral - Honey Teaspoon -- Oral - Honey Cup -- Oral - Nectar Teaspoon -- Oral - Nectar Cup -- Oral - Nectar Straw -- Oral - Thin Teaspoon -- Oral - Thin Cup -- Oral - Thin Straw -- Oral - Puree -- Oral - Mech Soft -- Oral - Regular -- Oral - Multi-Consistency -- Oral - Pill -- Oral Phase - Comment --  CHL IP PHARYNGEAL PHASE 09/10/2020 Pharyngeal Phase WFL Pharyngeal- Pudding Teaspoon -- Pharyngeal -- Pharyngeal- Pudding Cup -- Pharyngeal -- Pharyngeal-  Honey Teaspoon -- Pharyngeal -- Pharyngeal- Honey Cup -- Pharyngeal -- Pharyngeal- Nectar Teaspoon -- Pharyngeal -- Pharyngeal- Nectar Cup -- Pharyngeal -- Pharyngeal- Nectar Straw -- Pharyngeal -- Pharyngeal- Thin Teaspoon -- Pharyngeal -- Pharyngeal- Thin Cup -- Pharyngeal -- Pharyngeal- Thin Straw -- Pharyngeal -- Pharyngeal- Puree -- Pharyngeal -- Pharyngeal- Mechanical Soft -- Pharyngeal -- Pharyngeal- Regular -- Pharyngeal -- Pharyngeal- Multi-consistency -- Pharyngeal -- Pharyngeal- Pill -- Pharyngeal -- Pharyngeal Comment --  CHL IP CERVICAL ESOPHAGEAL PHASE 09/10/2020 Cervical Esophageal Phase WFL Pudding Teaspoon -- Pudding Cup -- Honey Teaspoon -- Honey Cup -- Nectar Teaspoon -- Nectar Cup -- Nectar Straw -- Thin Teaspoon -- Thin Cup -- Thin Straw -- Puree -- Mechanical Soft -- Regular -- Multi-consistency -- Pill -- Cervical Esophageal Comment -- Shanika I. [place] Negus, [place], [place] Office number [phone] Pager [phone] Horton Marshall 09/10/2020, 3:23 PM              DG Hip Unilat W or Wo Pelvis 2-3 Views Left  Result Date: 09/09/2020 CLINICAL DATA:  Recent fall with left hip pain, initial encounter EXAM: DG HIP (WITH OR WITHOUT PELVIS) 2-3V LEFT COMPARISON:  03/19/2020 FINDINGS: Postsurgical changes are again seen in the proximal right femur. No acute abnormality is noted. Prior healed pubic rami fractures on the right are again noted. Right iliac bone fracture superior to the acetabulum is again noted and stable. Remainder of the pelvic ring appears intact. Proximal left femur shows no acute abnormality. IMPRESSION: Chronic changes on the right. No acute left hip fracture is noted. Electronically Signed   By: Inez Catalina M.D.   On: 09/09/2020 08:33    Scheduled Meds: . citalopram  20 mg Oral Daily  . docusate sodium  100 mg Oral BID  . dorzolamide  1 drop Both Eyes BID  . enoxaparin (LOVENOX) injection  30 mg Subcutaneous  Q24H  . feeding supplement  237 mL  Oral BID BM  . gabapentin  600 mg Oral QHS  . latanoprost  1 drop Both Eyes QHS  . LORazepam  2 mg Oral TID  . multivitamin with minerals  1 tablet Oral Daily  . pantoprazole  40 mg Oral Daily  . saccharomyces boulardii  250 mg Oral BID  . traZODone  50 mg Oral QHS   Continuous Infusions: . sodium chloride 75 mL/hr at 09/09/20 1204  . azithromycin 500 mg (09/10/20 1019)  . cefTRIAXone (ROCEPHIN)  IV 2 g (09/10/20 0853)     LOS: 1 day   Marylu Lund, MD [facility] Pager On Amion  If 7PM-7AM, please contact night-coverage 09/10/2020, 3:26 PM

## 2020-09-10 NOTE — Care Management Obs Status (Signed)
[place] NOTIFICATION   Patient Details  Name: FELINA TELLO MRN: [id number] Date of Birth: 11/18/1924   Medicare Observation Status Notification Given:  Yes    Joanne Chars, LCSW 09/10/2020, 2:43 PM

## 2020-09-11 ENCOUNTER — Telehealth: Payer: Self-pay

## 2020-09-11 ENCOUNTER — Other Ambulatory Visit: Payer: Self-pay | Admitting: Internal Medicine

## 2020-09-11 DIAGNOSIS — Z7401 Bed confinement status: Secondary | ICD-10-CM | POA: Diagnosis not present

## 2020-09-11 DIAGNOSIS — M2550 Pain in unspecified joint: Secondary | ICD-10-CM | POA: Diagnosis not present

## 2020-09-11 DIAGNOSIS — J159 Unspecified bacterial pneumonia: Secondary | ICD-10-CM

## 2020-09-11 DIAGNOSIS — R531 Weakness: Secondary | ICD-10-CM | POA: Diagnosis not present

## 2020-09-11 DIAGNOSIS — R0902 Hypoxemia: Secondary | ICD-10-CM | POA: Diagnosis not present

## 2020-09-11 DIAGNOSIS — R5381 Other malaise: Secondary | ICD-10-CM | POA: Diagnosis not present

## 2020-09-11 DIAGNOSIS — J189 Pneumonia, unspecified organism: Secondary | ICD-10-CM | POA: Diagnosis not present

## 2020-09-11 LAB — CBC
HCT: 33.3 % — ABNORMAL LOW (ref 36.0–46.0)
Hemoglobin: 10.9 g/dL — ABNORMAL LOW (ref 12.0–15.0)
MCH: 32.1 pg (ref 26.0–34.0)
MCHC: 32.7 g/dL (ref 30.0–36.0)
MCV: 97.9 fL (ref 80.0–100.0)
Platelets: 272 10*3/uL (ref 150–400)
RBC: 3.4 MIL/uL — ABNORMAL LOW (ref 3.87–5.11)
RDW: 12.1 % (ref 11.5–15.5)
WBC: 14.6 10*3/uL — ABNORMAL HIGH (ref 4.0–10.5)
nRBC: 0 % (ref 0.0–0.2)

## 2020-09-11 LAB — COMPREHENSIVE METABOLIC PANEL
ALT: 23 U/L (ref 0–44)
AST: 37 U/L (ref 15–41)
Albumin: 2.8 g/dL — ABNORMAL LOW (ref 3.5–5.0)
Alkaline Phosphatase: 77 U/L (ref 38–126)
Anion gap: 8 (ref 5–15)
BUN: 11 mg/dL (ref 8–23)
CO2: 26 mmol/L (ref 22–32)
Calcium: 9.2 mg/dL (ref 8.9–10.3)
Chloride: 105 mmol/L (ref 98–111)
Creatinine, Ser: 0.81 mg/dL (ref 0.44–1.00)
GFR, Estimated: >60 mL/min (ref >60)
Glucose, Bld: 93 mg/dL (ref 70–99)
Potassium: 3.4 mmol/L — ABNORMAL LOW (ref 3.5–5.1)
Sodium: 139 mmol/L (ref 135–145)
Total Bilirubin: 0.6 mg/dL (ref 0.3–1.2)
Total Protein: 5.5 g/dL — ABNORMAL LOW (ref 6.5–8.1)

## 2020-09-11 LAB — MRSA PCR SCREENING: MRSA by PCR: NEGATIVE

## 2020-09-11 MED ORDER — AZITHROMYCIN 250 MG PO TABS
500.0000 mg | ORAL_TABLET | Freq: Every day | ORAL | Status: DC
  Administered 2020-09-11: 500 mg via ORAL
  Filled 2020-09-11: qty 2

## 2020-09-11 MED ORDER — POTASSIUM CHLORIDE CRYS ER 20 MEQ PO TBCR
60.0000 meq | EXTENDED_RELEASE_TABLET | Freq: Once | ORAL | Status: AC
  Administered 2020-09-11: 60 meq via ORAL
  Filled 2020-09-11: qty 3

## 2020-09-11 MED ORDER — CEFDINIR 300 MG PO CAPS
300.0000 mg | ORAL_CAPSULE | Freq: Two times a day (BID) | ORAL | Status: DC
  Filled 2020-09-11: qty 1

## 2020-09-11 NOTE — TOC Transition Note (Signed)
Transition of Care [facility]) - CM/SW Discharge Note   Patient Details  Name: Katrina Burnett MRN: [id number] Date of Birth: 1924-06-24  Transition of Care [facility]) CM/SW Contact:  Joanne Chars, LCSW Phone Number: 09/11/2020, 10:21 AM   Clinical Narrative:  Pt discharging back to [facility] Independent living.  [facility] will resume PT/OT services on site.      Final next level of care: Assisted Living Barriers to Discharge: No Barriers Identified   Patient Goals and CMS Choice Patient states their goals for this hospitalization and ongoing recovery are:: "get back to taking care of myself" CMS Medicare.gov Compare Post Acute Care list provided to::  (NA-return to [facility])    Discharge Placement              Patient chooses bed at:  ([facility]) Patient to be transferred to facility by: [facility] Name of family member notified: daughter [name], son Josph Macho Patient and family notified of of transfer: 09/11/20  Discharge Plan and Services In-house Referral: Clinical Social Work   Post Acute Care Choice: Home Health          DME Arranged: N/A         HH Arranged: PT,OT [place] Agency: Other - See comment Scientist, research (life sciences)) Date [place]: 09/10/20 Time [place]: 1210 Representative spoke with at [place]: [place] (SDOH) Interventions     Readmission Risk Interventions No flowsheet data found.

## 2020-09-11 NOTE — Plan of Care (Signed)

## 2020-09-11 NOTE — Progress Notes (Signed)
This RN found pt's IV laying on the ground. IV abx due at this time. Pt refusing to have another IV placed. This RN educated pt on importance of receiving her IV medications. Pt still refusing and saying "I am leaving today and I don't need anything." Provider Sheran Luz notified.

## 2020-09-11 NOTE — Progress Notes (Unsigned)
See admit H and P. Admitting physician discussed with family at time of presentation, established wishes were DNR. After discharge, family called now stating wishes are full code. Will place full code order

## 2020-09-11 NOTE — Telephone Encounter (Signed)
Spoke with patient's daughter [name] and scheduled an in-person Palliative Consult for 09/26/20 @ 12PM  COVID screening was negative. No pets in home. Patient lives in [place].   Consent obtained; updated Outlook/Netsmart/Team List and Epic.  Family is aware they may be receiving a call from NP the day before or day of to confirm appointment.

## 2020-09-12 NOTE — Progress Notes (Signed)
CSW recieved call from pt daughter asking about DNR status.  Daughter reports pt should not be DNR, states she spoke to someone at the hospital but wants to make sure this is changed in pt chart.  MD informed.   Lurline Idol, MSW, LCSW 5/6/20228:39 AM

## 2020-09-14 LAB — CULTURE, BLOOD (ROUTINE X 2)
Culture: NO GROWTH
Culture: NO GROWTH

## 2020-09-15 DIAGNOSIS — Z20828 Contact with and (suspected) exposure to other viral communicable diseases: Secondary | ICD-10-CM | POA: Diagnosis not present

## 2020-09-15 DIAGNOSIS — Z1159 Encounter for screening for other viral diseases: Secondary | ICD-10-CM | POA: Diagnosis not present

## 2020-09-16 DIAGNOSIS — Z09 Encounter for follow-up examination after completed treatment for conditions other than malignant neoplasm: Secondary | ICD-10-CM | POA: Diagnosis not present

## 2020-09-16 DIAGNOSIS — J189 Pneumonia, unspecified organism: Secondary | ICD-10-CM | POA: Diagnosis not present

## 2020-09-16 DIAGNOSIS — F329 Major depressive disorder, single episode, unspecified: Secondary | ICD-10-CM | POA: Diagnosis not present

## 2020-09-17 DIAGNOSIS — R2681 Unsteadiness on feet: Secondary | ICD-10-CM | POA: Diagnosis not present

## 2020-09-17 DIAGNOSIS — M6281 Muscle weakness (generalized): Secondary | ICD-10-CM | POA: Diagnosis not present

## 2020-09-17 DIAGNOSIS — R296 Repeated falls: Secondary | ICD-10-CM | POA: Diagnosis not present

## 2020-09-19 DIAGNOSIS — R2681 Unsteadiness on feet: Secondary | ICD-10-CM | POA: Diagnosis not present

## 2020-09-19 DIAGNOSIS — M6281 Muscle weakness (generalized): Secondary | ICD-10-CM | POA: Diagnosis not present

## 2020-09-19 DIAGNOSIS — R296 Repeated falls: Secondary | ICD-10-CM | POA: Diagnosis not present

## 2020-09-22 DIAGNOSIS — R2681 Unsteadiness on feet: Secondary | ICD-10-CM | POA: Diagnosis not present

## 2020-09-22 DIAGNOSIS — Z1159 Encounter for screening for other viral diseases: Secondary | ICD-10-CM | POA: Diagnosis not present

## 2020-09-22 DIAGNOSIS — Z20828 Contact with and (suspected) exposure to other viral communicable diseases: Secondary | ICD-10-CM | POA: Diagnosis not present

## 2020-09-22 DIAGNOSIS — M6281 Muscle weakness (generalized): Secondary | ICD-10-CM | POA: Diagnosis not present

## 2020-09-22 DIAGNOSIS — R296 Repeated falls: Secondary | ICD-10-CM | POA: Diagnosis not present

## 2020-09-23 DIAGNOSIS — R2681 Unsteadiness on feet: Secondary | ICD-10-CM | POA: Diagnosis not present

## 2020-09-23 DIAGNOSIS — M6281 Muscle weakness (generalized): Secondary | ICD-10-CM | POA: Diagnosis not present

## 2020-09-23 DIAGNOSIS — R296 Repeated falls: Secondary | ICD-10-CM | POA: Diagnosis not present

## 2020-09-26 ENCOUNTER — Other Ambulatory Visit: Payer: Self-pay | Admitting: Hospice

## 2020-09-26 ENCOUNTER — Other Ambulatory Visit: Payer: Self-pay

## 2020-09-29 ENCOUNTER — Telehealth: Payer: Self-pay

## 2020-09-29 DIAGNOSIS — Z20828 Contact with and (suspected) exposure to other viral communicable diseases: Secondary | ICD-10-CM | POA: Diagnosis not present

## 2020-09-29 DIAGNOSIS — Z1159 Encounter for screening for other viral diseases: Secondary | ICD-10-CM | POA: Diagnosis not present

## 2020-09-29 NOTE — Telephone Encounter (Signed)
Per Windy Canny NP patient declined services when she was called to confirm consult appointment. Referral canceled. PCP notified.

## 2020-10-02 DIAGNOSIS — R296 Repeated falls: Secondary | ICD-10-CM | POA: Diagnosis not present

## 2020-10-02 DIAGNOSIS — M6281 Muscle weakness (generalized): Secondary | ICD-10-CM | POA: Diagnosis not present

## 2020-10-02 DIAGNOSIS — R2681 Unsteadiness on feet: Secondary | ICD-10-CM | POA: Diagnosis not present

## 2020-10-03 DIAGNOSIS — M6281 Muscle weakness (generalized): Secondary | ICD-10-CM | POA: Diagnosis not present

## 2020-10-03 DIAGNOSIS — R296 Repeated falls: Secondary | ICD-10-CM | POA: Diagnosis not present

## 2020-10-03 DIAGNOSIS — R2681 Unsteadiness on feet: Secondary | ICD-10-CM | POA: Diagnosis not present

## 2020-10-06 DIAGNOSIS — Z1159 Encounter for screening for other viral diseases: Secondary | ICD-10-CM | POA: Diagnosis not present

## 2020-10-06 DIAGNOSIS — Z20828 Contact with and (suspected) exposure to other viral communicable diseases: Secondary | ICD-10-CM | POA: Diagnosis not present

## 2020-10-07 DIAGNOSIS — R296 Repeated falls: Secondary | ICD-10-CM | POA: Diagnosis not present

## 2020-10-07 DIAGNOSIS — M6281 Muscle weakness (generalized): Secondary | ICD-10-CM | POA: Diagnosis not present

## 2020-10-07 DIAGNOSIS — R2681 Unsteadiness on feet: Secondary | ICD-10-CM | POA: Diagnosis not present

## 2020-10-08 DIAGNOSIS — R296 Repeated falls: Secondary | ICD-10-CM | POA: Diagnosis not present

## 2020-10-08 DIAGNOSIS — M6281 Muscle weakness (generalized): Secondary | ICD-10-CM | POA: Diagnosis not present

## 2020-10-08 DIAGNOSIS — R2681 Unsteadiness on feet: Secondary | ICD-10-CM | POA: Diagnosis not present

## 2020-10-09 DIAGNOSIS — M6281 Muscle weakness (generalized): Secondary | ICD-10-CM | POA: Diagnosis not present

## 2020-10-09 DIAGNOSIS — R296 Repeated falls: Secondary | ICD-10-CM | POA: Diagnosis not present

## 2020-10-09 DIAGNOSIS — R2681 Unsteadiness on feet: Secondary | ICD-10-CM | POA: Diagnosis not present

## 2020-10-13 DIAGNOSIS — Z1159 Encounter for screening for other viral diseases: Secondary | ICD-10-CM | POA: Diagnosis not present

## 2020-10-13 DIAGNOSIS — Z20828 Contact with and (suspected) exposure to other viral communicable diseases: Secondary | ICD-10-CM | POA: Diagnosis not present

## 2020-10-14 DIAGNOSIS — M6281 Muscle weakness (generalized): Secondary | ICD-10-CM | POA: Diagnosis not present

## 2020-10-14 DIAGNOSIS — R296 Repeated falls: Secondary | ICD-10-CM | POA: Diagnosis not present

## 2020-10-14 DIAGNOSIS — R2681 Unsteadiness on feet: Secondary | ICD-10-CM | POA: Diagnosis not present

## 2020-10-15 DIAGNOSIS — M6281 Muscle weakness (generalized): Secondary | ICD-10-CM | POA: Diagnosis not present

## 2020-10-15 DIAGNOSIS — R2681 Unsteadiness on feet: Secondary | ICD-10-CM | POA: Diagnosis not present

## 2020-10-15 DIAGNOSIS — R296 Repeated falls: Secondary | ICD-10-CM | POA: Diagnosis not present

## 2020-10-16 DIAGNOSIS — M5136 Other intervertebral disc degeneration, lumbar region: Secondary | ICD-10-CM | POA: Diagnosis not present

## 2020-10-16 DIAGNOSIS — R300 Dysuria: Secondary | ICD-10-CM | POA: Diagnosis not present

## 2020-10-16 DIAGNOSIS — M159 Polyosteoarthritis, unspecified: Secondary | ICD-10-CM | POA: Diagnosis not present

## 2020-10-16 DIAGNOSIS — F329 Major depressive disorder, single episode, unspecified: Secondary | ICD-10-CM | POA: Diagnosis not present

## 2020-10-16 DIAGNOSIS — Z862 Personal history of diseases of the blood and blood-forming organs and certain disorders involving the immune mechanism: Secondary | ICD-10-CM | POA: Diagnosis not present

## 2020-10-16 DIAGNOSIS — M5030 Other cervical disc degeneration, unspecified cervical region: Secondary | ICD-10-CM | POA: Diagnosis not present

## 2020-10-16 DIAGNOSIS — F419 Anxiety disorder, unspecified: Secondary | ICD-10-CM | POA: Diagnosis not present

## 2020-10-16 DIAGNOSIS — G8929 Other chronic pain: Secondary | ICD-10-CM | POA: Diagnosis not present

## 2020-10-16 DIAGNOSIS — Z8781 Personal history of (healed) traumatic fracture: Secondary | ICD-10-CM | POA: Diagnosis not present

## 2020-10-16 DIAGNOSIS — R54 Age-related physical debility: Secondary | ICD-10-CM | POA: Diagnosis not present

## 2020-10-16 DIAGNOSIS — R296 Repeated falls: Secondary | ICD-10-CM | POA: Diagnosis not present

## 2020-10-20 DIAGNOSIS — Z1159 Encounter for screening for other viral diseases: Secondary | ICD-10-CM | POA: Diagnosis not present

## 2020-10-20 DIAGNOSIS — Z20828 Contact with and (suspected) exposure to other viral communicable diseases: Secondary | ICD-10-CM | POA: Diagnosis not present

## 2020-10-27 DIAGNOSIS — Z1159 Encounter for screening for other viral diseases: Secondary | ICD-10-CM | POA: Diagnosis not present

## 2020-10-27 DIAGNOSIS — Z20828 Contact with and (suspected) exposure to other viral communicable diseases: Secondary | ICD-10-CM | POA: Diagnosis not present

## 2020-11-10 DIAGNOSIS — Z20828 Contact with and (suspected) exposure to other viral communicable diseases: Secondary | ICD-10-CM | POA: Diagnosis not present

## 2020-11-10 DIAGNOSIS — Z1159 Encounter for screening for other viral diseases: Secondary | ICD-10-CM | POA: Diagnosis not present

## 2020-11-13 DIAGNOSIS — Z20822 Contact with and (suspected) exposure to covid-19: Secondary | ICD-10-CM | POA: Diagnosis not present

## 2020-11-19 DIAGNOSIS — G8929 Other chronic pain: Secondary | ICD-10-CM | POA: Diagnosis not present

## 2020-11-19 DIAGNOSIS — R296 Repeated falls: Secondary | ICD-10-CM | POA: Diagnosis not present

## 2020-11-19 DIAGNOSIS — R519 Headache, unspecified: Secondary | ICD-10-CM | POA: Diagnosis not present

## 2020-11-19 DIAGNOSIS — R0781 Pleurodynia: Secondary | ICD-10-CM | POA: Diagnosis not present

## 2020-11-19 DIAGNOSIS — K5901 Slow transit constipation: Secondary | ICD-10-CM | POA: Diagnosis not present

## 2020-11-26 DIAGNOSIS — F32A Depression, unspecified: Secondary | ICD-10-CM | POA: Diagnosis not present

## 2020-11-26 DIAGNOSIS — K219 Gastro-esophageal reflux disease without esophagitis: Secondary | ICD-10-CM | POA: Diagnosis not present

## 2020-11-26 DIAGNOSIS — Z515 Encounter for palliative care: Secondary | ICD-10-CM | POA: Diagnosis not present

## 2020-11-26 DIAGNOSIS — J441 Chronic obstructive pulmonary disease with (acute) exacerbation: Secondary | ICD-10-CM | POA: Diagnosis not present

## 2020-11-26 DIAGNOSIS — M810 Age-related osteoporosis without current pathological fracture: Secondary | ICD-10-CM | POA: Diagnosis not present

## 2020-11-26 DIAGNOSIS — Z8701 Personal history of pneumonia (recurrent): Secondary | ICD-10-CM | POA: Diagnosis not present

## 2020-11-26 DIAGNOSIS — G894 Chronic pain syndrome: Secondary | ICD-10-CM | POA: Diagnosis not present

## 2020-11-26 DIAGNOSIS — F419 Anxiety disorder, unspecified: Secondary | ICD-10-CM | POA: Diagnosis not present

## 2020-11-27 ENCOUNTER — Encounter (HOSPITAL_COMMUNITY): Payer: Self-pay | Admitting: Internal Medicine

## 2020-11-27 ENCOUNTER — Other Ambulatory Visit: Payer: Self-pay

## 2020-11-27 ENCOUNTER — Inpatient Hospital Stay (HOSPITAL_COMMUNITY)
Admission: EM | Admit: 2020-11-27 | Discharge: 2020-12-02 | DRG: 871 | Disposition: A | Discharge status: Home Health | Payer: Medicare Other | Source: Skilled Nursing Facility | Attending: Internal Medicine | Admitting: Internal Medicine

## 2020-11-27 ENCOUNTER — Emergency Department (HOSPITAL_COMMUNITY): Payer: Medicare Other

## 2020-11-27 DIAGNOSIS — R5381 Other malaise: Secondary | ICD-10-CM | POA: Diagnosis present

## 2020-11-27 DIAGNOSIS — R627 Adult failure to thrive: Secondary | ICD-10-CM | POA: Diagnosis present

## 2020-11-27 DIAGNOSIS — R4182 Altered mental status, unspecified: Secondary | ICD-10-CM

## 2020-11-27 DIAGNOSIS — R6520 Severe sepsis without septic shock: Secondary | ICD-10-CM | POA: Diagnosis present

## 2020-11-27 DIAGNOSIS — E872 Acidosis: Secondary | ICD-10-CM | POA: Diagnosis not present

## 2020-11-27 DIAGNOSIS — Z800 Family history of malignant neoplasm of digestive organs: Secondary | ICD-10-CM | POA: Diagnosis not present

## 2020-11-27 DIAGNOSIS — N179 Acute kidney failure, unspecified: Secondary | ICD-10-CM | POA: Diagnosis present

## 2020-11-27 DIAGNOSIS — I10 Essential (primary) hypertension: Secondary | ICD-10-CM | POA: Diagnosis not present

## 2020-11-27 DIAGNOSIS — I509 Heart failure, unspecified: Secondary | ICD-10-CM | POA: Diagnosis not present

## 2020-11-27 DIAGNOSIS — Z681 Body mass index (BMI) 19 or less, adult: Secondary | ICD-10-CM | POA: Diagnosis not present

## 2020-11-27 DIAGNOSIS — Z87891 Personal history of nicotine dependence: Secondary | ICD-10-CM | POA: Diagnosis not present

## 2020-11-27 DIAGNOSIS — K219 Gastro-esophageal reflux disease without esophagitis: Secondary | ICD-10-CM | POA: Diagnosis present

## 2020-11-27 DIAGNOSIS — R0600 Dyspnea, unspecified: Secondary | ICD-10-CM | POA: Diagnosis not present

## 2020-11-27 DIAGNOSIS — J9601 Acute respiratory failure with hypoxia: Secondary | ICD-10-CM | POA: Diagnosis present

## 2020-11-27 DIAGNOSIS — R918 Other nonspecific abnormal finding of lung field: Secondary | ICD-10-CM | POA: Diagnosis not present

## 2020-11-27 DIAGNOSIS — R062 Wheezing: Secondary | ICD-10-CM | POA: Diagnosis not present

## 2020-11-27 DIAGNOSIS — Z993 Dependence on wheelchair: Secondary | ICD-10-CM

## 2020-11-27 DIAGNOSIS — I5033 Acute on chronic diastolic (congestive) heart failure: Secondary | ICD-10-CM | POA: Diagnosis present

## 2020-11-27 DIAGNOSIS — E119 Type 2 diabetes mellitus without complications: Secondary | ICD-10-CM | POA: Diagnosis not present

## 2020-11-27 DIAGNOSIS — R404 Transient alteration of awareness: Secondary | ICD-10-CM | POA: Diagnosis not present

## 2020-11-27 DIAGNOSIS — E876 Hypokalemia: Secondary | ICD-10-CM | POA: Diagnosis present

## 2020-11-27 DIAGNOSIS — R0902 Hypoxemia: Secondary | ICD-10-CM

## 2020-11-27 DIAGNOSIS — F419 Anxiety disorder, unspecified: Secondary | ICD-10-CM | POA: Diagnosis present

## 2020-11-27 DIAGNOSIS — Z8249 Family history of ischemic heart disease and other diseases of the circulatory system: Secondary | ICD-10-CM | POA: Diagnosis not present

## 2020-11-27 DIAGNOSIS — F418 Other specified anxiety disorders: Secondary | ICD-10-CM | POA: Diagnosis present

## 2020-11-27 DIAGNOSIS — E43 Unspecified severe protein-calorie malnutrition: Secondary | ICD-10-CM | POA: Diagnosis present

## 2020-11-27 DIAGNOSIS — M810 Age-related osteoporosis without current pathological fracture: Secondary | ICD-10-CM | POA: Diagnosis present

## 2020-11-27 DIAGNOSIS — H409 Unspecified glaucoma: Secondary | ICD-10-CM | POA: Diagnosis present

## 2020-11-27 DIAGNOSIS — R0602 Shortness of breath: Secondary | ICD-10-CM

## 2020-11-27 DIAGNOSIS — G4700 Insomnia, unspecified: Secondary | ICD-10-CM | POA: Diagnosis present

## 2020-11-27 DIAGNOSIS — A419 Sepsis, unspecified organism: Secondary | ICD-10-CM | POA: Diagnosis present

## 2020-11-27 DIAGNOSIS — R5383 Other fatigue: Secondary | ICD-10-CM | POA: Diagnosis not present

## 2020-11-27 DIAGNOSIS — Z8673 Personal history of transient ischemic attack (TIA), and cerebral infarction without residual deficits: Secondary | ICD-10-CM | POA: Diagnosis not present

## 2020-11-27 DIAGNOSIS — R0689 Other abnormalities of breathing: Secondary | ICD-10-CM | POA: Diagnosis not present

## 2020-11-27 DIAGNOSIS — D509 Iron deficiency anemia, unspecified: Secondary | ICD-10-CM | POA: Diagnosis present

## 2020-11-27 DIAGNOSIS — R636 Underweight: Secondary | ICD-10-CM | POA: Diagnosis not present

## 2020-11-27 DIAGNOSIS — Z20822 Contact with and (suspected) exposure to covid-19: Secondary | ICD-10-CM | POA: Diagnosis present

## 2020-11-27 DIAGNOSIS — J189 Pneumonia, unspecified organism: Secondary | ICD-10-CM | POA: Diagnosis present

## 2020-11-27 DIAGNOSIS — I700 Atherosclerosis of aorta: Secondary | ICD-10-CM | POA: Diagnosis not present

## 2020-11-27 DIAGNOSIS — Z79899 Other long term (current) drug therapy: Secondary | ICD-10-CM

## 2020-11-27 DIAGNOSIS — F32A Depression, unspecified: Secondary | ICD-10-CM | POA: Diagnosis present

## 2020-11-27 DIAGNOSIS — M353 Polymyalgia rheumatica: Secondary | ICD-10-CM | POA: Diagnosis present

## 2020-11-27 DIAGNOSIS — Z7401 Bed confinement status: Secondary | ICD-10-CM | POA: Diagnosis not present

## 2020-11-27 DIAGNOSIS — T502X5A Adverse effect of carbonic-anhydrase inhibitors, benzothiadiazides and other diuretics, initial encounter: Secondary | ICD-10-CM | POA: Diagnosis present

## 2020-11-27 DIAGNOSIS — J9621 Acute and chronic respiratory failure with hypoxia: Secondary | ICD-10-CM | POA: Diagnosis present

## 2020-11-27 DIAGNOSIS — I959 Hypotension, unspecified: Secondary | ICD-10-CM | POA: Diagnosis not present

## 2020-11-27 DIAGNOSIS — R1310 Dysphagia, unspecified: Secondary | ICD-10-CM

## 2020-11-27 DIAGNOSIS — G894 Chronic pain syndrome: Secondary | ICD-10-CM | POA: Diagnosis not present

## 2020-11-27 DIAGNOSIS — Z66 Do not resuscitate: Secondary | ICD-10-CM | POA: Diagnosis present

## 2020-11-27 DIAGNOSIS — R1319 Other dysphagia: Secondary | ICD-10-CM | POA: Diagnosis not present

## 2020-11-27 DIAGNOSIS — I499 Cardiac arrhythmia, unspecified: Secondary | ICD-10-CM | POA: Diagnosis not present

## 2020-11-27 DIAGNOSIS — J441 Chronic obstructive pulmonary disease with (acute) exacerbation: Secondary | ICD-10-CM | POA: Diagnosis not present

## 2020-11-27 DIAGNOSIS — I5031 Acute diastolic (congestive) heart failure: Secondary | ICD-10-CM | POA: Diagnosis not present

## 2020-11-27 LAB — URINALYSIS, ROUTINE W REFLEX MICROSCOPIC
Bilirubin Urine: NEGATIVE
Glucose, UA: NEGATIVE mg/dL
Hgb urine dipstick: NEGATIVE
Ketones, ur: 5 mg/dL — AB
Leukocytes,Ua: NEGATIVE
Nitrite: NEGATIVE
Protein, ur: 30 mg/dL — AB
Specific Gravity, Urine: 1.023 (ref 1.005–1.030)
pH: 5 (ref 5.0–8.0)

## 2020-11-27 LAB — MAGNESIUM: Magnesium: 2 mg/dL (ref 1.7–2.4)

## 2020-11-27 LAB — CBC WITH DIFFERENTIAL/PLATELET
Abs Immature Granulocytes: 0.09 10*3/uL — ABNORMAL HIGH (ref 0.00–0.07)
Basophils Absolute: 0 10*3/uL (ref 0.0–0.1)
Basophils Relative: 0 %
Eosinophils Absolute: 0 10*3/uL (ref 0.0–0.5)
Eosinophils Relative: 0 %
HCT: 41 % (ref 36.0–46.0)
Hemoglobin: 13 g/dL (ref 12.0–15.0)
Immature Granulocytes: 1 %
Lymphocytes Relative: 8 %
Lymphs Abs: 1.1 10*3/uL (ref 0.7–4.0)
MCH: 30.2 pg (ref 26.0–34.0)
MCHC: 31.7 g/dL (ref 30.0–36.0)
MCV: 95.3 fL (ref 80.0–100.0)
Monocytes Absolute: 0.6 10*3/uL (ref 0.1–1.0)
Monocytes Relative: 5 %
Neutro Abs: 11.5 10*3/uL — ABNORMAL HIGH (ref 1.7–7.7)
Neutrophils Relative %: 86 %
Platelets: 320 10*3/uL (ref 150–400)
RBC: 4.3 MIL/uL (ref 3.87–5.11)
RDW: 13.1 % (ref 11.5–15.5)
WBC: 13.4 10*3/uL — ABNORMAL HIGH (ref 4.0–10.5)
nRBC: 0 % (ref 0.0–0.2)

## 2020-11-27 LAB — COMPREHENSIVE METABOLIC PANEL
ALT: 14 U/L (ref 0–44)
AST: 36 U/L (ref 15–41)
Albumin: 3.2 g/dL — ABNORMAL LOW (ref 3.5–5.0)
Alkaline Phosphatase: 72 U/L (ref 38–126)
Anion gap: 10 (ref 5–15)
BUN: 24 mg/dL — ABNORMAL HIGH (ref 8–23)
CO2: 25 mmol/L (ref 22–32)
Calcium: 9.1 mg/dL (ref 8.9–10.3)
Chloride: 105 mmol/L (ref 98–111)
Creatinine, Ser: 1.24 mg/dL — ABNORMAL HIGH (ref 0.44–1.00)
GFR, Estimated: 40 mL/min — ABNORMAL LOW (ref >60)
Glucose, Bld: 130 mg/dL — ABNORMAL HIGH (ref 70–99)
Potassium: 4.5 mmol/L (ref 3.5–5.1)
Sodium: 140 mmol/L (ref 135–145)
Total Bilirubin: 0.8 mg/dL (ref 0.3–1.2)
Total Protein: 6.6 g/dL (ref 6.5–8.1)

## 2020-11-27 LAB — BLOOD GAS, ARTERIAL
Acid-base deficit: 1.3 mmol/L (ref 0.0–2.0)
Bicarbonate: 23.3 mmol/L (ref 20.0–28.0)
O2 Saturation: 92.6 %
Patient temperature: 98.6
pCO2 arterial: 41.2 mmHg (ref 32.0–48.0)
pH, Arterial: 7.372 (ref 7.350–7.450)
pO2, Arterial: 69.5 mmHg — ABNORMAL LOW (ref 83.0–108.0)

## 2020-11-27 LAB — PROTIME-INR
INR: 1 (ref 0.8–1.2)
Prothrombin Time: 13 seconds (ref 11.4–15.2)

## 2020-11-27 LAB — TROPONIN I (HIGH SENSITIVITY)
Troponin I (High Sensitivity): 32 ng/L — ABNORMAL HIGH (ref <18)
Troponin I (High Sensitivity): 28 ng/L — ABNORMAL HIGH (ref <18)

## 2020-11-27 LAB — RESP PANEL BY RT-PCR (FLU A&B, COVID) ARPGX2
Influenza A by PCR: NEGATIVE
Influenza B by PCR: NEGATIVE
SARS Coronavirus 2 by RT PCR: NEGATIVE

## 2020-11-27 LAB — PREALBUMIN: Prealbumin: 9.4 mg/dL — ABNORMAL LOW (ref 18–38)

## 2020-11-27 LAB — LACTIC ACID, PLASMA
Lactic Acid, Venous: 1.3 mmol/L (ref 0.5–1.9)
Lactic Acid, Venous: 2.8 mmol/L (ref 0.5–1.9)

## 2020-11-27 MED ORDER — GUAIFENESIN ER 600 MG PO TB12
600.0000 mg | ORAL_TABLET | Freq: Two times a day (BID) | ORAL | Status: DC
  Administered 2020-11-27 – 2020-12-02 (×10): 600 mg via ORAL
  Filled 2020-11-27 (×11): qty 1

## 2020-11-27 MED ORDER — PANTOPRAZOLE SODIUM 40 MG PO TBEC
40.0000 mg | DELAYED_RELEASE_TABLET | Freq: Every day | ORAL | Status: DC
  Administered 2020-11-28 – 2020-12-02 (×5): 40 mg via ORAL
  Filled 2020-11-27 (×5): qty 1

## 2020-11-27 MED ORDER — SODIUM CHLORIDE 0.9 % IV SOLN: INTRAVENOUS | Status: DC

## 2020-11-27 MED ORDER — TRAZODONE HCL 50 MG PO TABS
50.0000 mg | ORAL_TABLET | Freq: Every day | ORAL | Status: DC
  Administered 2020-11-27: 50 mg via ORAL
  Filled 2020-11-27: qty 1

## 2020-11-27 MED ORDER — OXYCODONE HCL 5 MG PO TABS: 5.0000 mg | ORAL_TABLET | Freq: Four times a day (QID) | ORAL | Status: AC | PRN

## 2020-11-27 MED ORDER — DORZOLAMIDE HCL 2 % OP SOLN
1.0000 [drp] | Freq: Two times a day (BID) | OPHTHALMIC | Status: DC
  Administered 2020-11-27 – 2020-12-02 (×11): 1 [drp] via OPHTHALMIC
  Filled 2020-11-27: qty 10

## 2020-11-27 MED ORDER — ALBUTEROL SULFATE (2.5 MG/3ML) 0.083% IN NEBU
2.5000 mg | INHALATION_SOLUTION | RESPIRATORY_TRACT | Status: DC | PRN
  Administered 2020-11-28 – 2020-11-29 (×4): 2.5 mg via RESPIRATORY_TRACT
  Filled 2020-11-27 (×3): qty 3

## 2020-11-27 MED ORDER — FERROUS SULFATE 325 (65 FE) MG PO TABS
325.0000 mg | ORAL_TABLET | Freq: Two times a day (BID) | ORAL | Status: DC
  Administered 2020-11-28 – 2020-12-02 (×10): 325 mg via ORAL
  Filled 2020-11-27 (×10): qty 1

## 2020-11-27 MED ORDER — LORAZEPAM 1 MG PO TABS
2.0000 mg | ORAL_TABLET | Freq: Three times a day (TID) | ORAL | Status: DC
  Administered 2020-11-27 – 2020-11-28 (×3): 2 mg via ORAL
  Filled 2020-11-27 (×3): qty 2

## 2020-11-27 MED ORDER — SODIUM CHLORIDE 0.9 % IV BOLUS
1000.0000 mL | Freq: Once | INTRAVENOUS | Status: AC
  Administered 2020-11-27: 1000 mL via INTRAVENOUS

## 2020-11-27 MED ORDER — SODIUM CHLORIDE 0.9 % IV SOLN
2.0000 g | INTRAVENOUS | Status: DC
  Administered 2020-11-28: 2 g via INTRAVENOUS
  Filled 2020-11-27: qty 2

## 2020-11-27 MED ORDER — OXYCODONE-ACETAMINOPHEN 5-325 MG PO TABS
1.0000 | ORAL_TABLET | Freq: Four times a day (QID) | ORAL | Status: DC | PRN
  Administered 2020-11-28: 1 via ORAL
  Filled 2020-11-27: qty 1

## 2020-11-27 MED ORDER — ACETAMINOPHEN 650 MG RE SUPP: 650.0000 mg | Freq: Four times a day (QID) | RECTAL | Status: DC | PRN

## 2020-11-27 MED ORDER — ADULT MULTIVITAMIN W/MINERALS CH
1.0000 | ORAL_TABLET | Freq: Every day | ORAL | Status: DC
  Administered 2020-11-29 – 2020-12-02 (×3): 1 via ORAL
  Filled 2020-11-27 (×6): qty 1

## 2020-11-27 MED ORDER — VANCOMYCIN HCL 750 MG/150ML IV SOLN
750.0000 mg | Freq: Once | INTRAVENOUS | Status: AC
  Administered 2020-11-27: 750 mg via INTRAVENOUS
  Filled 2020-11-27: qty 150

## 2020-11-27 MED ORDER — GABAPENTIN 300 MG PO CAPS
300.0000 mg | ORAL_CAPSULE | Freq: Every day | ORAL | Status: DC
  Administered 2020-11-27 – 2020-12-02 (×6): 300 mg via ORAL
  Filled 2020-11-27 (×7): qty 1

## 2020-11-27 MED ORDER — LATANOPROST 0.005 % OP SOLN
1.0000 [drp] | Freq: Every day | OPHTHALMIC | Status: DC
  Administered 2020-11-27 – 2020-12-02 (×6): 1 [drp] via OPHTHALMIC
  Filled 2020-11-27: qty 2.5

## 2020-11-27 MED ORDER — SODIUM CHLORIDE 0.9 % IV SOLN
2.0000 g | Freq: Once | INTRAVENOUS | Status: AC
  Administered 2020-11-27: 2 g via INTRAVENOUS
  Filled 2020-11-27: qty 2

## 2020-11-27 MED ORDER — ACETAMINOPHEN 325 MG PO TABS
650.0000 mg | ORAL_TABLET | Freq: Four times a day (QID) | ORAL | Status: DC | PRN
  Administered 2020-11-27 – 2020-11-30 (×3): 650 mg via ORAL
  Administered 2020-12-01: 325 mg via ORAL
  Administered 2020-12-01 – 2020-12-02 (×3): 650 mg via ORAL
  Filled 2020-11-27 (×8): qty 2

## 2020-11-27 MED ORDER — RISAQUAD PO CAPS
1.0000 | ORAL_CAPSULE | Freq: Every day | ORAL | Status: DC
  Administered 2020-11-27 – 2020-12-02 (×5): 1 via ORAL
  Filled 2020-11-27 (×6): qty 1

## 2020-11-27 NOTE — ED Provider Notes (Signed)
[facility] DEPT Provider Note   CSN: [id number] Arrival date & time: 11/27/20  1350     History Chief Complaint  Patient presents with   Fatigue   Altered Mental Status    AQUITA SIMMERING is a 87 y.o. female.  87 year old female with prior medical history as detailed below presents for evaluation.  Patient is transported to the ED via EMS from her facility.  Patient was noted to be more fatigued and slower to respond to the course of today.  EMS reports that her room air sats are in the mid 70s.  This was improved with supplemental oxygen.  Currently, on 5 L nasal cannula the patient is satting 90%.  With questioning the patient is alert.  She denies any specific complaint.  She appears comfortable.  Patient is not oriented to situation or time.   The history is provided by the patient, the EMS personnel and medical records.  Altered Mental Status Presenting symptoms: lethargy and partial responsiveness   Severity:  Mild Most recent episode:  Today Episode history:  Single Duration:  1 day Timing:  Constant Progression:  Worsening Chronicity:  New     Past Medical History:  Diagnosis Date   Allergic rhinitis    Anxiety    Arthritis    "hands" (11/25/2016)   Barrett's esophagus 06/1997   CAP (community acquired pneumonia) 11/25/2016   "this is my 3rd time having pneumonia" (11/25/2016)   Chondromalacia    Chronic lower back pain    Depression    Diverticulosis    GERD (gastroesophageal reflux disease)    Glaucoma    History of hiatal hernia    History of stomach ulcers    Lumbar compression fracture (HCC)    L1 and L2   Osteoporosis    Pneumonia    Polymyalgia ([place])    Stroke ([place])    Tubular adenoma of colon 06/1999   Vaginitis    Varicose veins     Patient Active Problem List   Diagnosis Date Noted   CAP (community acquired pneumonia) 09/09/2020   Malnutrition ([place]) 09/09/2020   Chronic pain disorder 09/09/2020   Glaucoma  09/09/2020   DNR (do not resuscitate) 09/09/2020   Acute respiratory failure with hypoxia ([place]) 01/24/2020   Acute hypoxemic respiratory failure ([place]) 01/23/2020   Depression with anxiety    Thrombocytosis    GIB (gastrointestinal bleeding) 11/21/2019   Esophageal mass 11/19/2019   Hiatal hernia    Esophagitis    Pelvic fracture ([place]) 11/11/2019   Pneumothorax on left 10/24/2018   Pneumothorax, left 10/24/2018   Acute on chronic anemia 11/25/2016   Hyponatremia 11/25/2016   Fracture, intertrochanteric, right femur ([place]) 05/26/2015   Closed fracture of femur ([place])    Acute blood loss anemia    Adjustment disorder with mixed anxiety and depressed mood    Fall    Abnormality of gait    Femur fracture, right, closed, initial encounter 05/23/2015   Fracture of femoral neck, right, closed ([place]) 05/22/2015   Femoral neck fracture, right, closed, initial encounter 05/22/2015   Syncope 05/30/2012   Nausea & vomiting 05/30/2012   Chest pain 05/30/2012   Hypotension 05/30/2012   GERD (gastroesophageal reflux disease) 12/16/2011   Lumbar compression fracture ([place])    Polymyalgia ([place])     Past Surgical History:  Procedure Laterality Date   BACK SURGERY     BIOPSY  11/19/2019   Procedure: BIOPSY;  Surgeon: Gatha Mayer, MD;  Location:  [facility];  Service: Endoscopy;;   CATARACT EXTRACTION W/ INTRAOCULAR LENS  IMPLANT, BILATERAL Bilateral    ESOPHAGOGASTRODUODENOSCOPY (EGD) WITH PROPOFOL N/A 11/19/2019   Procedure: ESOPHAGOGASTRODUODENOSCOPY (EGD) WITH PROPOFOL;  Surgeon: Gatha Mayer, MD;  Location: [facility];  Service: Endoscopy;  Laterality: N/A;   FIXATION KYPHOPLASTY LUMBAR SPINE  10/12/10; 09/20/11   FRACTURE SURGERY     IR KYPHO THORACIC WITH BONE BIOPSY  03/24/2018   IR VERTEBROPLASTY CERV/THOR BX INC UNI/BIL INC/INJECT/IMAGING  03/24/2018   LUMBAR SPINE SURGERY     "did OR on 2 fractures in my lower back"   LUNG BIOPSY     ORIF HIP FRACTURE Right 05/23/2015    Procedure: OPEN REDUCTION INTERNAL FIXATION HIP;  Surgeon: Frederik Pear, MD;  Location: [facility];  Service: Orthopedics;  Laterality: Right;   VARICOSE VEIN SURGERY     vein ligation and stripping "years ago"     OB History     Gravida  4   Para  4   Term  4   Preterm      AB      Living  4      SAB      IAB      Ectopic      Multiple      Live Births              Family History  Problem Relation Age of Onset   Colon cancer Mother 71   Congestive Heart Failure Mother    Heart disease Father    Alcohol abuse Father    Heart disease Maternal Grandmother    Fibromyalgia Son     Social History   Tobacco Use   Smoking status: Former    Years: 1.00    Types: Cigarettes   Smokeless tobacco: Never   Tobacco comments:    At age 21 yrs old but nothing since   Vaping Use   Vaping Use: Never used  Substance Use Topics   Alcohol use: No   Drug use: No    Home Medications Prior to Admission medications   Medication Sig Start Date End Date Taking? Authorizing Provider  acetaminophen (TYLENOL) 325 MG tablet Take 325 mg by mouth every 6 (six) hours as needed (for breakthrough pain in between percocet). -MAX DOSE IN 24 HOURS- 3000mg     [provider]  azithromycin (ZITHROMAX) 250 MG tablet take 1 tablet by mouth daily x 5 days, then stop 09/10/20   Donne Hazel, MD  citalopram (CELEXA) 40 MG tablet Take 60 mg by mouth daily.     [provider]  cyclobenzaprine (FLEXERIL) 10 MG tablet Take 10 mg by mouth every 8 (eight) hours as needed for muscle spasms.    [provider]  dorzolamide (TRUSOPT) 2 % ophthalmic solution Place 1 drop into both eyes 2 (two) times daily.     [provider]  ferrous sulfate 325 (65 FE) MG tablet Take 1 tablet (325 mg total) by mouth 2 (two) times daily with a meal. 01/27/20   Ghimire, Henreitta Leber, MD  gabapentin (NEURONTIN) 300 MG capsule Take 600 mg by mouth at bedtime.    [provider]   latanoprost (XALATAN) 0.005 % ophthalmic solution Place 1 drop into both eyes at bedtime.    [provider]  LINZESS 72 MCG capsule Take 72 mcg by mouth every morning. 08/12/20   [provider]  LORazepam (ATIVAN) 2 MG tablet Take 2 mg by mouth 3 (  three) times daily.    [provider]  Multiple Vitamin (MULTIVITAMIN) tablet Take 1 tablet by mouth at bedtime.     [provider]  oxyCODONE-acetaminophen (PERCOCET) 10-325 MG tablet Take 1 tablet by mouth every 6 (six) hours as needed for pain.    [provider]  pantoprazole (PROTONIX) 40 MG tablet Take 1 tablet (40 mg total) by mouth daily. 11/23/19 03/19/20  ShahmehdiValeria Batman, MD  Probiotic Product (ALIGN) 4 MG CAPS Take 4 mg by mouth at bedtime.     [provider]  traZODone (DESYREL) 50 MG tablet Take 0.5-1 tablets (25-50 mg total) by mouth at bedtime as needed for sleep. Patient taking differently: Take 50 mg by mouth at bedtime. 10/28/18   [name], MD    Allergies    Minocycline, Paxil [paroxetine hcl], Sulfa antibiotics, Amoxicillin, Bupropion hcl er (sr), Cyclobenzaprine, Mirtazapine, Vilazodone hcl, Avelox [moxifloxacin hcl in nacl], Cefuroxime, Clarithromycin, Levaquin [levofloxacin in d5w], Nystatin-triamcinolone, Penicillins, and Prednisone  Review of Systems   Review of Systems  All other systems reviewed and are negative.  Physical Exam Updated Vital Signs BP 106/67 (BP Location: Right Arm)   Pulse 87   Temp (!) 97.3 F (36.3 C) (Oral)   Resp 18   Ht 5' (1.524 m)   Wt 45.4 kg   SpO2 (!) 84%   BMI 19.53 kg/m   Physical Exam Vitals and nursing note reviewed.  Constitutional:      General: She is not in acute distress.    Appearance: Normal appearance. She is well-developed.  HENT:     Head: Normocephalic and atraumatic.  Eyes:     Conjunctiva/sclera: Conjunctivae normal.     Pupils: Pupils are equal, round, and reactive to light.  Cardiovascular:      Rate and Rhythm: Normal rate and regular rhythm.     Heart sounds: Normal heart sounds.  Pulmonary:     Effort: Pulmonary effort is normal. No respiratory distress.     Comments: Decreased BS at bilateral bases  Abdominal:     General: There is no distension.     Palpations: Abdomen is soft.     Tenderness: There is no abdominal tenderness.  Musculoskeletal:        General: No deformity. Normal range of motion.     Cervical back: Normal range of motion and neck supple.  Skin:    General: Skin is warm and dry.  Neurological:     General: No focal deficit present.     Mental Status: She is alert.    ED Results / Procedures / Treatments   Labs (all labs ordered are listed, but only abnormal results are displayed) Labs Reviewed  BLOOD GAS, ARTERIAL - Abnormal; Notable for the following components:      Result Value   pO2, Arterial 69.5 (*)    All other components within normal limits  COMPREHENSIVE METABOLIC PANEL - Abnormal; Notable for the following components:   Glucose, Bld 130 (*)    BUN 24 (*)    Creatinine, Ser 1.24 (*)    Albumin 3.2 (*)    GFR, Estimated 40 (*)    All other components within normal limits  CBC WITH DIFFERENTIAL/PLATELET - Abnormal; Notable for the following components:   WBC 13.4 (*)    Neutro Abs 11.5 (*)    Abs Immature Granulocytes 0.09 (*)    All other components within normal limits  TROPONIN I (HIGH SENSITIVITY) - Abnormal; Notable for the following components:  Troponin I (High Sensitivity) 32 (*)    All other components within normal limits  URINE CULTURE  CULTURE, BLOOD (ROUTINE X 2)  CULTURE, BLOOD (ROUTINE X 2)  RESP PANEL BY RT-PCR (FLU A&B, COVID) ARPGX2  PROTIME-INR  LACTIC ACID, PLASMA  URINALYSIS, ROUTINE W REFLEX MICROSCOPIC  LACTIC ACID, PLASMA  TROPONIN I (HIGH SENSITIVITY)    EKG EKG Interpretation  Date/Time:  Thursday November 27 2020 14:12:18 EDT Ventricular Rate:  84 PR Interval:  150 QRS Duration: 90 QT  Interval:  452 QTC Calculation: 535 R Axis:   -28 Text Interpretation: Sinus rhythm Borderline left axis deviation Abnormal R-wave progression, early transition Borderline T abnormalities, anterior leads Prolonged QT interval Confirmed by Dene Gentry [phone]) on 11/27/2020 2:15:56 PM  Radiology No results found.  Procedures Procedures   Medications Ordered in ED Medications - No data to display  ED Course  I have reviewed the triage vital signs and the nursing notes.  Pertinent labs & imaging results that were available during my care of the patient were reviewed by me and considered in my medical decision making (see chart for details).    MDM Rules/Calculators/A&P                          CRITICAL CARE Performed by: Valarie Merino   Total critical care time: 30 minutes  Critical care time was exclusive of separately billable procedures and treating other patients.  Critical care was necessary to treat or prevent imminent or life-threatening deterioration.  Critical care was time spent personally by me on the following activities: development of treatment plan with patient and/or surrogate as well as nursing, discussions with consultants, evaluation of patient's response to treatment, examination of patient, obtaining history from patient or surrogate, ordering and performing treatments and interventions, ordering and review of laboratory studies, ordering and review of radiographic studies, pulse oximetry and re-evaluation of patient's condition.   MDM  MSE complete  BRIANI MAUL was evaluated in Emergency Department on 11/27/2020 for the symptoms described in the history of present illness. She was evaluated in the context of the global COVID-19 pandemic, which necessitated consideration that the patient might be at risk for infection with the SARS-CoV-2 virus that causes COVID-19. Institutional protocols and algorithms that pertain to the evaluation of patients at risk  for COVID-19 are in a state of rapid change based on information released by regulatory bodies including the CDC and federal and state organizations. These policies and algorithms were followed during the patient's care in the ED.   87 year old female is presenting with reported lethargy and decreased room air pulse ox.   Presentation is consistent with possible pneumonia.  Broad spectrum antibiotics initiated in the ED.  Patient's case discussed with the patient's daughter Charissa Bash) over the phone.  Patient is known to [facility] [phone]).  Hospice service is aware that patient is at [facility].  Daughter confirms that patient is full code but with DNI.  Patient would benefit from admission.   Hospitalist Service is aware of case and will evaluate for same.    Final Clinical Impression(s) / ED Diagnoses Final diagnoses:  None    Rx / DC Orders ED Discharge Orders     None        Valarie Merino, MD 11/27/20 1614

## 2020-11-27 NOTE — Progress Notes (Signed)
A consult was received from an ED physician for Vancomycin and Cefepime per pharmacy dosing.  The patient's profile has been reviewed for ht/wt/allergies/indication/available labs.   Despite listed PCN and cephalosporin allergies/intolerances, she has tolerated cefazolin and ceftriaxone on multiple past encounters. A one time order has been placed for Cefepime 2g, Vanc 750 mg IV.  Further antibiotics/pharmacy consults should be ordered by admitting physician if indicated.                       Thank you,  Gretta Arab PharmD, BCPS Clinical Pharmacist [facility] main pharmacy [phone] 11/27/2020 3:35 PM

## 2020-11-27 NOTE — Progress Notes (Signed)
Pt is extremely agitated. Unable to do vest at this time. Therapist will reassess in the am.

## 2020-11-27 NOTE — H&P (Addendum)
History and Physical    Katrina Burnett:[id number] DOB: [birth year] DOA: 11/27/2020  PCP: Katrina Morning, DO  Patient coming from: SNF  Chief Complaint: weakness, hypoxia  HPI: Katrina Burnett is a 87 y.o. female with medical history significant of GERD, anxiety, arthritis. Presenting with weakness and hypoxia. Hx is from chart review and daughter as patient is a poor historian. The patient has had several days of "not being herself". She has been slower in thought, speech, and movement. She has seemed more weak and lethargic. During conversations w/ her daughter, she seems to drift off to sleep. She has reported some trouble with breathing and cough over the last several days. The home hospice team has accessed her and found her to be hypoxic. They have been having problems keeping her O2 saturations up. Today, they notified the daughter that the patient should go to the ED for evaluation. EMS was called. On their evaluation, the patient was hypoxic w/ and O2 sat in the mid-70s. They brought her to the ED for evaluation.  ED Course: She was found to be hypoxic w/ an elevated WBC. CXR showed multifocal PNA. She was started on cefepime/vanc. She was found to have an AKI. She was started on fluids. [facility] was called for admission.   Review of Systems:  Denies CP, palpitations, N/V/D, fever. Review of systems is otherwise negative for all not mentioned in HPI.   PMHx Past Medical History:  Diagnosis Date   Allergic rhinitis    Anxiety    Arthritis    "hands" (11/25/2016)   Barrett's esophagus 06/1997   CAP (community acquired pneumonia) 11/25/2016   "this is my 3rd time having pneumonia" (11/25/2016)   Chondromalacia    Chronic lower back pain    Depression    Diverticulosis    GERD (gastroesophageal reflux disease)    Glaucoma    History of hiatal hernia    History of stomach ulcers    Lumbar compression fracture (HCC)    L1 and L2   Osteoporosis    Pneumonia    Polymyalgia (HCC)    Stroke  ([place])    Tubular adenoma of colon 06/1999   Vaginitis    Varicose veins     PSHx Past Surgical History:  Procedure Laterality Date   BACK SURGERY     BIOPSY  11/19/2019   Procedure: BIOPSY;  Surgeon: Gatha Mayer, MD;  Location: [facility] ENDOSCOPY;  Service: Endoscopy;;   CATARACT EXTRACTION W/ INTRAOCULAR LENS  IMPLANT, BILATERAL Bilateral    ESOPHAGOGASTRODUODENOSCOPY (EGD) WITH PROPOFOL N/A 11/19/2019   Procedure: ESOPHAGOGASTRODUODENOSCOPY (EGD) WITH PROPOFOL;  Surgeon: Gatha Mayer, MD;  Location: [facility] ENDOSCOPY;  Service: Endoscopy;  Laterality: N/A;   FIXATION KYPHOPLASTY LUMBAR SPINE  10/12/10; 09/20/11   FRACTURE SURGERY     IR KYPHO THORACIC WITH BONE BIOPSY  03/24/2018   IR VERTEBROPLASTY CERV/THOR BX INC UNI/BIL INC/INJECT/IMAGING  03/24/2018   LUMBAR SPINE SURGERY     "did OR on 2 fractures in my lower back"   LUNG BIOPSY     ORIF HIP FRACTURE Right 05/23/2015   Procedure: OPEN REDUCTION INTERNAL FIXATION HIP;  Surgeon: Frederik Pear, MD;  Location: [place];  Service: Orthopedics;  Laterality: Right;   VARICOSE VEIN SURGERY     vein ligation and stripping "years ago"    SocHx  reports that she has quit smoking. Her smoking use included cigarettes. She has never used smokeless tobacco. She reports that she does not drink alcohol and does  not use drugs.  Allergies  Allergen Reactions   Minocycline Nausea And Vomiting   Paxil [Paroxetine Hcl] Nausea And Vomiting   Sulfa Antibiotics Nausea And Vomiting   Amoxicillin Other (See Comments)    Doesn't remember  Has patient had a PCN reaction causing immediate rash, facial/tongue/throat swelling, SOB or lightheadedness with hypotension: Unknown Has patient had a PCN reaction causing severe rash involving mucus membranes or skin necrosis: Unknown Has patient had a PCN reaction that required hospitalization: Unknown Has patient had a PCN reaction occurring within the last 10 years: Unknown If all of the above answers are "NO", then may  proceed with Cephalosporin use. Other reaction(s): sore mouth   Bupropion Hcl Er (Sr)     Other reaction(s): fatigue   Cyclobenzaprine     Other reaction(s): dry mouth   Mirtazapine Other (See Comments)    Doesn't remember  Other reaction(s): jittery   Vilazodone Hcl Other (See Comments)    Doesn't remember  Other reaction(s): dizziness   Avelox [Moxifloxacin Hcl In Nacl] Other (See Comments)    Felt bad   Cefuroxime Diarrhea    Other reaction(s): severe diarrhea   Clarithromycin Nausea Only    Other reaction(s): stomach pain   Levaquin [Levofloxacin In D5w] Diarrhea   Nystatin-Triamcinolone Rash   Penicillins Rash   Prednisone Nausea Only    Other reaction(s): sick    FamHx Family History  Problem Relation Age of Onset   Colon cancer Mother 65   Congestive Heart Failure Mother    Heart disease Father    Alcohol abuse Father    Heart disease Maternal Grandmother    Fibromyalgia Son     Prior to Admission medications   Medication Sig Start Date End Date Taking? Authorizing Provider  acetaminophen (TYLENOL) 325 MG tablet Take 325 mg by mouth every 6 (six) hours as needed (for breakthrough pain in between percocet). -MAX DOSE IN 24 HOURS- 3000mg     [provider]  azithromycin (ZITHROMAX) 250 MG tablet take 1 tablet by mouth daily x 5 days, then stop 09/10/20   Donne Hazel, MD  citalopram (CELEXA) 40 MG tablet Take 60 mg by mouth daily.     [provider]  cyclobenzaprine (FLEXERIL) 10 MG tablet Take 10 mg by mouth every 8 (eight) hours as needed for muscle spasms.    [provider]  dorzolamide (TRUSOPT) 2 % ophthalmic solution Place 1 drop into both eyes 2 (two) times daily.     [provider]  ferrous sulfate 325 (65 FE) MG tablet Take 1 tablet (325 mg total) by mouth 2 (two) times daily with a meal. 01/27/20   Ghimire, Henreitta Leber, MD  gabapentin (NEURONTIN) 300 MG capsule Take 600 mg by mouth at bedtime.    [provider]   latanoprost (XALATAN) 0.005 % ophthalmic solution Place 1 drop into both eyes at bedtime.    [provider]  LINZESS 72 MCG capsule Take 72 mcg by mouth every Burnett. 08/12/20   [provider]  LORazepam (ATIVAN) 2 MG tablet Take 2 mg by mouth 3 (three) times daily.    [provider]  Multiple Vitamin (MULTIVITAMIN) tablet Take 1 tablet by mouth at bedtime.     [provider]  oxyCODONE-acetaminophen (PERCOCET) 10-325 MG tablet Take 1 tablet by mouth every 6 (six) hours as needed for pain.    [provider]  pantoprazole (PROTONIX) 40 MG tablet Take 1 tablet (40 mg total) by mouth daily. 11/23/19  03/19/20  Shahmehdi, Valeria Batman, MD  Probiotic Product (ALIGN) 4 MG CAPS Take 4 mg by mouth at bedtime.     [provider]  traZODone (DESYREL) 50 MG tablet Take 0.5-1 tablets (25-50 mg total) by mouth at bedtime as needed for sleep. Patient taking differently: Take 50 mg by mouth at bedtime. 10/28/18   [name], MD    Physical Exam: Vitals:   11/27/20 1404 11/27/20 1405 11/27/20 1415 11/27/20 1445  BP: 106/67  106/68 95/64  Pulse: 87  84 80  Resp: 18  13 15   Temp: (!) 97.3 F (36.3 C)     TempSrc: Oral     SpO2: (!) 84%  91% 92%  Weight:  45.4 kg    Height:  5' (1.524 m)      General: 87 y.o. frail appearing female resting in bed Eyes: PERRL, normal sclera ENMT: Nares patent w/o discharge, orophaynx clear, dentition normal, ears w/o discharge/lesions/ulcers Neck: Supple, trachea midline Cardiovascular: RRR, +S1, S2, no m/g/r, equal pulses throughout Respiratory: diffuse rhonchi, increased WOB on 5L [place] GI: BS+, NDNT, no masses noted, no organomegaly noted MSK: No e/c/c Skin: No rashes, bruises, ulcerations noted Neuro: A&O x 3, no focal deficits Psyc: flat affect, calm/cooperative  Labs on Admission: I have personally reviewed following labs and imaging studies  CBC: Recent Labs  Lab 11/27/20 1409  WBC 13.4*  NEUTROABS  11.5*  HGB 13.0  HCT 41.0  MCV 95.3  PLT 546   Basic Metabolic Panel: Recent Labs  Lab 11/27/20 1409  NA 140  K 4.5  CL 105  CO2 25  GLUCOSE 130*  BUN 24*  CREATININE 1.24*  CALCIUM 9.1   GFR: Estimated Creatinine Clearance: 19.9 mL/min (A) (by C-G formula based on SCr of 1.24 mg/dL (H)). Liver Function Tests: Recent Labs  Lab 11/27/20 1409  AST 36  ALT 14  ALKPHOS 72  BILITOT 0.8  PROT 6.6  ALBUMIN 3.2*   No results for input(s): LIPASE, AMYLASE in the last 168 hours. No results for input(s): AMMONIA in the last 168 hours. Coagulation Profile: Recent Labs  Lab 11/27/20 1409  INR 1.0   Cardiac Enzymes: No results for input(s): CKTOTAL, CKMB, CKMBINDEX, TROPONINI in the last 168 hours. BNP (last 3 results) No results for input(s): PROBNP in the last 8760 hours. HbA1C: No results for input(s): HGBA1C in the last 72 hours. CBG: No results for input(s): GLUCAP in the last 168 hours. Lipid Profile: No results for input(s): CHOL, HDL, LDLCALC, TRIG, CHOLHDL, LDLDIRECT in the last 72 hours. Thyroid Function Tests: No results for input(s): TSH, T4TOTAL, FREET4, T3FREE, THYROIDAB in the last 72 hours. Anemia Panel: No results for input(s): VITAMINB12, FOLATE, FERRITIN, TIBC, IRON, RETICCTPCT in the last 72 hours. Urine analysis:    Component Value Date/Time   COLORURINE YELLOW 01/23/2020 1522   APPEARANCEUR CLEAR 01/23/2020 1522   LABSPEC 1.012 01/23/2020 1522   PHURINE 5.0 01/23/2020 1522   GLUCOSEU NEGATIVE 01/23/2020 1522   HGBUR NEGATIVE 01/23/2020 1522   BILIRUBINUR NEGATIVE 01/23/2020 1522   KETONESUR NEGATIVE 01/23/2020 1522   PROTEINUR NEGATIVE 01/23/2020 1522   UROBILINOGEN 0.2 05/24/2013 1634   NITRITE POSITIVE (A) 01/23/2020 1522   LEUKOCYTESUR SMALL (A) 01/23/2020 1522    Radiological Exams on Admission: DG Chest Port 1 View  Result Date: 11/27/2020 CLINICAL DATA:  Dyspnea.  Fatigue and altered mental status. EXAM: PORTABLE CHEST 1 VIEW  COMPARISON:  Radiograph 09/09/2020. chest CT 11/19/2019 FINDINGS: Streaky right basilar opacities with some  improvement from prior exam. There are new ill-defined opacities in the right mid lung. Streaky left basilar opacities. Patient is rotated. Grossly stable heart size allowing for rotation. Aortic tortuosity. No pulmonary edema, large pleural effusion or pneumothorax. Bones are diffusely under mineralized multiple thoracic and lumbar vertebral compression fractures. Remote right distal clavicle fracture with nonunion. IMPRESSION: Multifocal lung opacities. There is improvement in right basilar opacity from prior, however no opacities at the left lung base and right mid lung. Findings are suspicious for multifocal pneumonia, with aspiration considered. Electronically Signed   By: Keith Rake M.D.   On: 11/27/2020 15:14    EKG: Independently reviewed. Sinus, no st elevations; prolonged Qt  Assessment/Plan Multifocal PNA Acute hypoxic respiratory failure d/t above     - admit to inpt, med-surg     - recent hospitalization, so covered with broad spec in ED (cefepime, vanc); let's swab for MRSA before giving anymore vanc, continue cefepime w/ pharm renal dosing     - concern for aspiration, will get SLP consult     - wean O2 as able     - guaifenesin, nebs, CPT  AKI     - fluids, likely pre-renal, follow  Adult failure to thrive Poor PO intake     - check pre-albumin     - dietary consult  Anxiety     - continue home regimen  GERD     - PPI  Prolonged QT     - K+ ok, check Mg2+     - avoid qt prolonging meds  Elevated troponin     - mild (32); trend     - no chest pain, EKG ok   DVT prophylaxis: SCDs  Code Status: DNR confirmed with daughter  Family Communication: Spoke with daughter by phone. Updated with plan.  Consults called: None   Status is: Inpatient  Remains inpatient appropriate because:Inpatient level of care appropriate due to severity of illness  Dispo:  The patient is from: SNF              Anticipated d/c is to:  TBD              Patient currently is not medically stable to d/c.   Difficult to place patient No  Time spent coordinating admission: 60 minutes  [facility]  If 7PM-7AM, please contact night-coverage [email or web address]  11/27/2020, 4:03 PM

## 2020-11-27 NOTE — Progress Notes (Signed)
Pharmacy Antibiotic Note  Katrina Burnett is a 87 y.o. female admitted on 11/27/2020 with pneumonia.  Pharmacy has been consulted for cefepime dosing.  Plan: Cefepime 2 g IV q24 hr   Height: 5' (152.4 cm) Weight: 45.4 kg (100 lb) IBW/kg (Calculated) : 45.5  Temp (24hrs), Avg:97.3 F (36.3 C), Min:97.3 F (36.3 C), Max:97.3 F (36.3 C)  Recent Labs  Lab 11/27/20 1409  WBC 13.4*  CREATININE 1.24*  LATICACIDVEN 1.3    Estimated Creatinine Clearance: 19.9 mL/min (A) (by C-G formula based on SCr of 1.24 mg/dL (H)).    Allergies  Allergen Reactions   Minocycline Nausea And Vomiting   Paxil [Paroxetine Hcl] Nausea And Vomiting   Sulfa Antibiotics Nausea And Vomiting   Amoxicillin Other (See Comments)    Doesn't remember reaction other than sore mouth Tolerates cefepime   Bupropion Hcl Er (Sr)     Other reaction(s): fatigue   Cyclobenzaprine     Other reaction(s): dry mouth   Mirtazapine Other (See Comments)    Doesn't remember  Other reaction(s): jittery   Vilazodone Hcl Other (See Comments)    Doesn't remember  Other reaction(s): dizziness   Avelox [Moxifloxacin Hcl In Nacl] Other (See Comments)    Felt bad   Cefuroxime Diarrhea    Other reaction(s): severe diarrhea   Clarithromycin Nausea Only    Other reaction(s): stomach pain   Levaquin [Levofloxacin In D5w] Diarrhea   Nystatin-Triamcinolone Rash   Penicillins Rash   Prednisone Nausea Only    Other reaction(s): sick     Thank you for allowing pharmacy to be a part of this patient's care.  Katrina Burnett A 11/27/2020 5:00 PM

## 2020-11-27 NOTE — ED Notes (Signed)
Transport called.

## 2020-11-27 NOTE — Plan of Care (Signed)
Plan of care discussed with family at [phone].

## 2020-11-28 DIAGNOSIS — E43 Unspecified severe protein-calorie malnutrition: Secondary | ICD-10-CM | POA: Diagnosis present

## 2020-11-28 DIAGNOSIS — I509 Heart failure, unspecified: Secondary | ICD-10-CM | POA: Diagnosis not present

## 2020-11-28 DIAGNOSIS — A419 Sepsis, unspecified organism: Secondary | ICD-10-CM | POA: Diagnosis not present

## 2020-11-28 DIAGNOSIS — R636 Underweight: Secondary | ICD-10-CM | POA: Diagnosis present

## 2020-11-28 DIAGNOSIS — J9601 Acute respiratory failure with hypoxia: Secondary | ICD-10-CM | POA: Diagnosis not present

## 2020-11-28 LAB — CBC
HCT: 33.5 % — ABNORMAL LOW (ref 36.0–46.0)
Hemoglobin: 10.3 g/dL — ABNORMAL LOW (ref 12.0–15.0)
MCH: 29.7 pg (ref 26.0–34.0)
MCHC: 30.7 g/dL (ref 30.0–36.0)
MCV: 96.5 fL (ref 80.0–100.0)
Platelets: 252 10*3/uL (ref 150–400)
RBC: 3.47 MIL/uL — ABNORMAL LOW (ref 3.87–5.11)
RDW: 13 % (ref 11.5–15.5)
WBC: 9.8 10*3/uL (ref 4.0–10.5)
nRBC: 0 % (ref 0.0–0.2)

## 2020-11-28 LAB — COMPREHENSIVE METABOLIC PANEL
ALT: 13 U/L (ref 0–44)
AST: 23 U/L (ref 15–41)
Albumin: 2.5 g/dL — ABNORMAL LOW (ref 3.5–5.0)
Alkaline Phosphatase: 48 U/L (ref 38–126)
Anion gap: 9 (ref 5–15)
BUN: 20 mg/dL (ref 8–23)
CO2: 22 mmol/L (ref 22–32)
Calcium: 8.4 mg/dL — ABNORMAL LOW (ref 8.9–10.3)
Chloride: 110 mmol/L (ref 98–111)
Creatinine, Ser: 0.81 mg/dL (ref 0.44–1.00)
GFR, Estimated: >60 mL/min (ref >60)
Glucose, Bld: 107 mg/dL — ABNORMAL HIGH (ref 70–99)
Potassium: 3.8 mmol/L (ref 3.5–5.1)
Sodium: 141 mmol/L (ref 135–145)
Total Bilirubin: 0.4 mg/dL (ref 0.3–1.2)
Total Protein: 5.2 g/dL — ABNORMAL LOW (ref 6.5–8.1)

## 2020-11-28 LAB — BRAIN NATRIURETIC PEPTIDE: B Natriuretic Peptide: 983.6 pg/mL — ABNORMAL HIGH (ref 0.0–100.0)

## 2020-11-28 LAB — LACTIC ACID, PLASMA
Lactic Acid, Venous: 2.3 mmol/L (ref 0.5–1.9)
Lactic Acid, Venous: 1.6 mmol/L (ref 0.5–1.9)

## 2020-11-28 LAB — MRSA NEXT GEN BY PCR, NASAL: MRSA by PCR Next Gen: NOT DETECTED

## 2020-11-28 LAB — PROCALCITONIN: Procalcitonin: 4.09 ng/mL

## 2020-11-28 LAB — STREP PNEUMONIAE URINARY ANTIGEN: Strep Pneumo Urinary Antigen: NEGATIVE

## 2020-11-28 MED ORDER — LORAZEPAM 1 MG PO TABS
1.0000 mg | ORAL_TABLET | Freq: Three times a day (TID) | ORAL | Status: DC
  Administered 2020-11-28 – 2020-12-02 (×13): 1 mg via ORAL
  Filled 2020-11-28 (×13): qty 1

## 2020-11-28 MED ORDER — TRAZODONE HCL 50 MG PO TABS
25.0000 mg | ORAL_TABLET | Freq: Every day | ORAL | Status: DC
  Administered 2020-11-28 – 2020-12-02 (×5): 25 mg via ORAL
  Filled 2020-11-28 (×5): qty 1

## 2020-11-28 MED ORDER — METRONIDAZOLE 500 MG/100ML IV SOLN
500.0000 mg | Freq: Three times a day (TID) | INTRAVENOUS | Status: DC
  Administered 2020-11-28 – 2020-11-30 (×5): 500 mg via INTRAVENOUS
  Filled 2020-11-28 (×5): qty 100

## 2020-11-28 MED ORDER — SODIUM CHLORIDE 0.9 % IV SOLN
1.0000 g | INTRAVENOUS | Status: DC
  Administered 2020-11-28 – 2020-11-29 (×2): 1 g via INTRAVENOUS
  Filled 2020-11-28 (×2): qty 1

## 2020-11-28 MED ORDER — ALBUTEROL SULFATE (2.5 MG/3ML) 0.083% IN NEBU
2.5000 mg | INHALATION_SOLUTION | RESPIRATORY_TRACT | Status: DC
  Administered 2020-11-28 – 2020-11-30 (×7): 2.5 mg via RESPIRATORY_TRACT
  Filled 2020-11-28 (×12): qty 3

## 2020-11-28 MED ORDER — ENSURE ENLIVE PO LIQD
237.0000 mL | Freq: Two times a day (BID) | ORAL | Status: DC
  Administered 2020-11-28 – 2020-12-02 (×8): 237 mL via ORAL

## 2020-11-28 NOTE — Progress Notes (Signed)
Son was updated throughout the day at the bedside. Charissa Bash, daughter was called and updated at the end of the shift. All questions were answered.  She stated that pt was on Ativan for years and make sure it "is not dc, as pt will get worse" Report was given to upcoming nurse, Bed in low position, alarms are on, call bell in reach

## 2020-11-28 NOTE — Progress Notes (Signed)
RT attempted Chest Vest at lowest settings.  Pt unable to tolerate at any length of time.

## 2020-11-28 NOTE — Progress Notes (Signed)
Initial Nutrition Assessment  DOCUMENTATION CODES:   Severe malnutrition in context of chronic illness, Underweight  INTERVENTION:   -Ensure Enlive po BID, each supplement provides 350 kcal and 20 grams of protein  -Magic cup BID with meals, each supplement provides 290 kcal and 9 grams of protein  NUTRITION DIAGNOSIS:   Severe Malnutrition related to chronic illness as evidenced by severe fat depletion, severe muscle depletion.  GOAL:   Patient will meet greater than or equal to 90% of their needs  MONITOR:   PO intake, Supplement acceptance, Labs, Weight trends, I & O's  REASON FOR ASSESSMENT:   Consult Assessment of nutrition requirement/status  ASSESSMENT:   87 y.o. female with medical history significant of GERD, anxiety, arthritis. Presenting with weakness and hypoxia. Hx is from chart review and daughter as patient is a poor historian. The patient has had several days of "not being herself". She has been slower in thought, speech, and movement. She has seemed more weak and lethargic.  Patient in room, no family at bedside. Pt not able to provide much history, when asked if she was cold she states "yes". Pt's RN states that the family was able to get pt to drink a Ensure but RN didn't feel pt could safely eat anything on her lunch tray. SLP to evaluate when pt more alert/awake. RN had given pt some Ativan this morning.  Will order Ensure and Magic cups with trays.   Per weight records, pt has remained underweight for some time. Was diagnosed with severe malnutrition in May 2022. Malnutrition continues.  Medications: Ferrous sulfate, Multivitamin with minerals daily   Labs reviewed.  NUTRITION - FOCUSED PHYSICAL EXAM:  Flowsheet Row Most Recent Value  Orbital Region Moderate depletion  Upper Arm Region Severe depletion  Thoracic and Lumbar Region Unable to assess  Buccal Region Moderate depletion  Temple Region Severe depletion  Clavicle Bone Region Moderate  depletion  Clavicle and Acromion Bone Region Moderate depletion  Scapular Bone Region Unable to assess  Dorsal Hand Severe depletion  Patellar Region Severe depletion  Anterior Thigh Region Severe depletion  Posterior Calf Region Severe depletion  Edema (RD Assessment) None  Mouth Reviewed       Diet Order:   Diet Order             Diet Heart Room service appropriate? Yes; Fluid consistency: Thin  Diet effective now                   EDUCATION NEEDS:   No education needs have been identified at this time  Skin:  Skin Assessment: Reviewed RN Assessment  Last BM:  7/22- type 6  Height:   Ht Readings from Last 1 Encounters:  11/27/20 5' (1.524 m)    Weight:   Wt Readings from Last 1 Encounters:  11/27/20 41.9 kg    BMI:  Body mass index is 18.04 kg/m.  Estimated Nutritional Needs:   Kcal:  1300-1500  Protein:  60-75g  Fluid:  1.5L/day  Clayton Bibles, MS, RD, LDN Inpatient Clinical Dietitian Contact information available via Amion

## 2020-11-28 NOTE — Progress Notes (Signed)
PROGRESS NOTE  Katrina Burnett LOV:[id number] DOB: October 21, 1924 DOA: 11/27/2020 PCP: [name], DO  HPI/Recap of past 91 hours: 87 year old female with past medical history of GERD and anxiety who is mostly wheelchair-bound at this point brought in by family on the afternoon of 7/21 for weakness and shortness of breath and not feeling herself for several days.  Noted to be much more lethargic.  In the emergency room, patient found to be hypoxic and septic secondary to pneumonia.  Started on fluids and oxygen and antibiotics and admitted to the hospitalist service.  Initial lactic acid level normal at 1.3.  On night of 7/21, lactic acid level had increased to 2.8.  By this morning, renal function normalized.  No further fevers.  Patient still quite dyspneic.  BNP checked in afternoon and found to be elevated at 980.  No previous history of CHF.  Follow-up lactic acid level down to 2.3.  Patient complains of some cough and shortness of breath.  Assessment/Plan: Principal Problem:   Sepsis ([place]) secondary to multifocal pneumonia causing acute respiratory failure with hypoxia: Suspect may be aspirating.  She takes a number of different medications for anxiety which may be heavily sedating.  See below.  Patient met criteria for sepsis on admission given leukocytosis, tachypnea and tachycardia.  Severe sepsis with lactic acidosis developed following admission.  With IV fluids, patient's lactic acid level has since improved however now she is volume overloaded.  Nevertheless, will continue IV fluids (have briefly decreased fluids to [facility] for a few hours until lactic acid level of 2.3 came back) at high rate until lactic acid level within normal limits.  Then can start diuresis.  Checking swallow evaluation by speech therapy given aspiration concerns.  Change cefepime to Zosyn to cover for anaerobes Active Problems:   Depression with anxiety: Patient has a number of different medications including trazodone  and Ativan and Neurontin.  Have decreased doses of all 3 so that we can prevent continued sedation.  It is unclear if she has any dementia    Protein-calorie malnutrition, severe: Seen by nutrition. Nutrition Status: Nutrition Problem: Severe Malnutrition Etiology: chronic illness Signs/Symptoms: severe fat depletion, severe muscle depletion Interventions: Ensure Enlive (each supplement provides 350kcal and 20 grams of protein), MVI, Magic cup  Acute kidney injury: Secondary to sepsis, resolved     Acute CHF (congestive heart failure) ([place]): As mentioned above, no previous history of CHF.  Last echocardiogram in 2014 normal.  Have ordered new echocardiogram although cannot start Lasix until lactic acid level normalized.    Underweight: Meets criteria with BMI at 18   Code Status: DNR  Family Communication: Son at the bedside  Disposition Plan: Hopefully patient will survive this hospitalization.  Need to stabilize CHF and sepsis   Consultants: None  Procedures: Echocardiogram pending  Antimicrobials: IV vancomycin 7/21 only IV cefepime 7/20 1-11/2020 IV Zosyn 7/22-present  DVT prophylaxis: SCDs  Level of care: Med-Surg   Objective: Vitals:   11/28/20 1500 11/28/20 1756  BP: (!) 160/72 138/78  Pulse: 63 73  Resp: (!) 28 18  Temp: 99.6 F (37.6 C) 98.6 F (37 C)  SpO2: 98% 96%    Intake/Output Summary (Last 24 hours) at 11/28/2020 1844 Last data filed at 11/28/2020 1700 Gross per 24 hour  Intake 3248.74 ml  Output 200 ml  Net 3048.74 ml   Filed Weights   11/27/20 1405 11/27/20 1934  Weight: 45.4 kg 41.9 kg   Body mass index is 18.04 kg/m.  Exam:  General: Awake, oriented times 1-2, mild distress secondary to shortness of breath and cough HEENT: Normocephalic and atraumatic, mucous membranes are dry Cardiovascular: Regular rate and rhythm, borderline tachycardia Respiratory: Scattered rhonchi Abdomen: Soft nontender, nondistended, positive bowel  sounds Musculoskeletal: No clubbing or cyanosis or edema Skin: No skin breaks, tears or lesions Psychiatry: Anxious, possible underlying mild dementia Neurology: No overt focal deficits   Data Reviewed: CBC: Recent Labs  Lab 11/27/20 1409 11/28/20 0515  WBC 13.4* 9.8  NEUTROABS 11.5*  --   HGB 13.0 10.3*  HCT 41.0 33.5*  MCV 95.3 96.5  PLT 320 017   Basic Metabolic Panel: Recent Labs  Lab 11/27/20 1409 11/27/20 2018 11/28/20 0515  NA 140  --  141  K 4.5  --  3.8  CL 105  --  110  CO2 25  --  22  GLUCOSE 130*  --  107*  BUN 24*  --  20  CREATININE 1.24*  --  0.81  CALCIUM 9.1  --  8.4*  MG  --  2.0  --    GFR: Estimated Creatinine Clearance: 28.1 mL/min (by C-G formula based on SCr of 0.81 mg/dL). Liver Function Tests: Recent Labs  Lab 11/27/20 1409 11/28/20 0515  AST 36 23  ALT 14 13  ALKPHOS 72 48  BILITOT 0.8 0.4  PROT 6.6 5.2*  ALBUMIN 3.2* 2.5*   No results for input(s): LIPASE, AMYLASE in the last 168 hours. No results for input(s): AMMONIA in the last 168 hours. Coagulation Profile: Recent Labs  Lab 11/27/20 1409  INR 1.0   Cardiac Enzymes: No results for input(s): CKTOTAL, CKMB, CKMBINDEX, TROPONINI in the last 168 hours. BNP (last 3 results) No results for input(s): PROBNP in the last 8760 hours. HbA1C: No results for input(s): HGBA1C in the last 72 hours. CBG: No results for input(s): GLUCAP in the last 168 hours. Lipid Profile: No results for input(s): CHOL, HDL, LDLCALC, TRIG, CHOLHDL, LDLDIRECT in the last 72 hours. Thyroid Function Tests: No results for input(s): TSH, T4TOTAL, FREET4, T3FREE, THYROIDAB in the last 72 hours. Anemia Panel: No results for input(s): VITAMINB12, FOLATE, FERRITIN, TIBC, IRON, RETICCTPCT in the last 72 hours. Urine analysis:    Component Value Date/Time   COLORURINE YELLOW 11/27/2020 1848   APPEARANCEUR HAZY (A) 11/27/2020 1848   LABSPEC 1.023 11/27/2020 1848   PHURINE 5.0 11/27/2020 1848   GLUCOSEU  NEGATIVE 11/27/2020 1848   HGBUR NEGATIVE 11/27/2020 1848   BILIRUBINUR NEGATIVE 11/27/2020 1848   KETONESUR 5 (A) 11/27/2020 1848   PROTEINUR 30 (A) 11/27/2020 1848   UROBILINOGEN 0.2 05/24/2013 1634   NITRITE NEGATIVE 11/27/2020 1848   LEUKOCYTESUR NEGATIVE 11/27/2020 1848   Sepsis Labs: _0 (procalcitonin:4,lacticidven:4)  ) Recent Results (from the past 240 hour(s))  Culture, blood (routine x 2)     Status: None (Preliminary result)   Collection Time: 11/27/20  2:09 PM   Specimen: BLOOD  Result Value Ref Range Status   Specimen Description   Final    BLOOD LEFT ARM Performed at [facility], [place] [address] [place], [place] [address]    Special Requests   Final    BOTTLES DRAWN AEROBIC AND ANAEROBIC Blood Culture adequate volume Performed at [facility] [address], [place], [place] [address]    Culture   Final    NO GROWTH < 24 HOURS Performed at [facility] [address], [place], [place] [address]    Report Status PENDING  Incomplete  Culture, blood (routine x 2)     Status: None (Preliminary result)   Collection Time: 11/27/20  2:14 PM   Specimen: BLOOD  Result Value Ref Range Status   Specimen Description   Final    BLOOD LEFT ARM Performed at [facility] [address], [place], [place] [address]    Special Requests   Final    BOTTLES DRAWN AEROBIC AND ANAEROBIC Blood Culture adequate volume Performed at [facility] [address] [place], [place] [address]    Culture   Final    NO GROWTH < 24 HOURS Performed at [facility] [address] [place], [place] [address]    Report Status PENDING  Incomplete  Resp Panel by RT-PCR (Flu A&B, Covid) Nasopharyngeal Swab     Status: None   Collection Time: 11/27/20  2:27 PM   Specimen: Nasopharyngeal Swab; Nasopharyngeal(NP) swabs in vial transport medium  Result Value Ref Range Status   SARS Coronavirus 2 by  RT PCR NEGATIVE NEGATIVE Final    Comment: (NOTE) SARS-CoV-2 target nucleic acids are NOT DETECTED.  The SARS-CoV-2 RNA is generally detectable in upper respiratory specimens during the acute phase of infection. The lowest concentration of SARS-CoV-2 viral copies this assay can detect is 138 copies/mL. A negative result does not preclude SARS-Cov-2 infection and should not be used as the sole basis for treatment or other patient management decisions. A negative result may occur with  improper specimen collection/handling, submission of specimen other than nasopharyngeal swab, presence of viral mutation(s) within the areas targeted by this assay, and inadequate number of viral copies(<138 copies/mL). A negative result must be combined with clinical observations, patient history, and epidemiological information. The expected result is Negative.  Fact Sheet for Patients:  EntrepreneurPulse.com.au  Fact Sheet for Healthcare Providers:  IncredibleEmployment.be  This test is no t yet approved or cleared by the [place] FDA and  has been authorized for detection and/or diagnosis of SARS-CoV-2 by FDA under an Emergency Use Authorization (EUA). This EUA will remain  in effect (meaning this test can be used) for the duration of the COVID-19 declaration under Section 564(b)(1) of the Act, 21 U.S.C.section 360bbb-3(b)(1), unless the authorization is terminated  or revoked sooner.       Influenza A by PCR NEGATIVE NEGATIVE Final   Influenza B by PCR NEGATIVE NEGATIVE Final    Comment: (NOTE) The Xpert Xpress SARS-CoV-2/FLU/RSV plus assay is intended as an aid in the diagnosis of influenza from Nasopharyngeal swab specimens and should not be used as a sole basis for treatment. Nasal washings and aspirates are unacceptable for Xpert Xpress SARS-CoV-2/FLU/RSV testing.  Fact Sheet for Patients: EntrepreneurPulse.com.au  Fact Sheet  for Healthcare Providers: IncredibleEmployment.be  This test is not yet approved or cleared by the [place] FDA and has been authorized for detection and/or diagnosis of SARS-CoV-2 by FDA under an Emergency Use Authorization (EUA). This EUA will remain in effect (meaning this test can be used) for the duration of the COVID-19 declaration under Section 564(b)(1) of the Act, 21 U.S.C. section 360bbb-3(b)(1), unless the authorization is terminated or revoked.  Performed at [facility], [place] [address], [place], [place] [address]   MRSA Next Gen by PCR, Nasal     Status: None   Collection Time: 11/27/20  7:50 PM   Specimen: Nasal Mucosa; Nasal Swab  Result Value Ref Range Status   MRSA by PCR Next Gen NOT DETECTED NOT DETECTED  Final    Comment: (NOTE) The GeneXpert MRSA Assay (FDA approved for NASAL specimens only), is one component of a comprehensive MRSA colonization surveillance program. It is not intended to diagnose MRSA infection nor to guide or monitor treatment for MRSA infections. Test performance is not FDA approved in patients less than 63 years old. Performed at [facility], [place] [address] [place], [place] [address]       Studies: No results found.  Scheduled Meds:  acidophilus  1 capsule Oral QHS   albuterol  2.5 mg Nebulization Q4H WA   dorzolamide  1 drop Both Eyes BID   feeding supplement  237 mL Oral BID BM   ferrous sulfate  325 mg Oral BID WC   gabapentin  300 mg Oral QHS   guaiFENesin  600 mg Oral BID   latanoprost  1 drop Both Eyes QHS   LORazepam  1 mg Oral TID   multivitamin with minerals  1 tablet Oral QHS   pantoprazole  40 mg Oral Daily   traZODone  25 mg Oral QHS    Continuous Infusions:  sodium chloride 10 mL/hr at 11/28/20 1708   ceFEPime (MAXIPIME) IV Stopped (11/28/20 1330)     LOS: 1 day     Annita Brod, MD [facility]   11/28/2020, 6:44 PM

## 2020-11-28 NOTE — Evaluation (Signed)
SLP Cancellation Note  Patient Details Name: Katrina Burnett MRN: [id number] DOB: June 18, 1924   Cancelled treatment:       Reason Eval/Treat Not Completed: Other (comment) (pt not alert at this time, RN reports she was able to get pt to take medications with Ensure)  Kathleen Lime, MS [place] [facility] [phone] Pager [phone]  Macario Golds 11/28/2020, 12:18 PM

## 2020-11-29 ENCOUNTER — Inpatient Hospital Stay (HOSPITAL_COMMUNITY): Payer: Medicare Other

## 2020-11-29 DIAGNOSIS — I5031 Acute diastolic (congestive) heart failure: Secondary | ICD-10-CM

## 2020-11-29 DIAGNOSIS — E43 Unspecified severe protein-calorie malnutrition: Secondary | ICD-10-CM | POA: Diagnosis not present

## 2020-11-29 DIAGNOSIS — A419 Sepsis, unspecified organism: Secondary | ICD-10-CM | POA: Diagnosis not present

## 2020-11-29 DIAGNOSIS — J189 Pneumonia, unspecified organism: Secondary | ICD-10-CM | POA: Diagnosis not present

## 2020-11-29 DIAGNOSIS — J9601 Acute respiratory failure with hypoxia: Secondary | ICD-10-CM | POA: Diagnosis not present

## 2020-11-29 LAB — CBC
HCT: 36.7 % (ref 36.0–46.0)
Hemoglobin: 11.7 g/dL — ABNORMAL LOW (ref 12.0–15.0)
MCH: 29.9 pg (ref 26.0–34.0)
MCHC: 31.9 g/dL (ref 30.0–36.0)
MCV: 93.9 fL (ref 80.0–100.0)
Platelets: 297 10*3/uL (ref 150–400)
RBC: 3.91 MIL/uL (ref 3.87–5.11)
RDW: 12.8 % (ref 11.5–15.5)
WBC: 9.8 10*3/uL (ref 4.0–10.5)
nRBC: 0 % (ref 0.0–0.2)

## 2020-11-29 LAB — COMPREHENSIVE METABOLIC PANEL
ALT: 18 U/L (ref 0–44)
AST: 32 U/L (ref 15–41)
Albumin: 3.3 g/dL — ABNORMAL LOW (ref 3.5–5.0)
Alkaline Phosphatase: 75 U/L (ref 38–126)
Anion gap: 10 (ref 5–15)
BUN: 28 mg/dL — ABNORMAL HIGH (ref 8–23)
CO2: 26 mmol/L (ref 22–32)
Calcium: 9.3 mg/dL (ref 8.9–10.3)
Chloride: 106 mmol/L (ref 98–111)
Creatinine, Ser: 0.89 mg/dL (ref 0.44–1.00)
GFR, Estimated: >60 mL/min (ref >60)
Glucose, Bld: 115 mg/dL — ABNORMAL HIGH (ref 70–99)
Potassium: 2.9 mmol/L — ABNORMAL LOW (ref 3.5–5.1)
Sodium: 142 mmol/L (ref 135–145)
Total Bilirubin: 0.4 mg/dL (ref 0.3–1.2)
Total Protein: 6.6 g/dL (ref 6.5–8.1)

## 2020-11-29 LAB — LACTIC ACID, PLASMA: Lactic Acid, Venous: 1.3 mmol/L (ref 0.5–1.9)

## 2020-11-29 LAB — ECHOCARDIOGRAM COMPLETE
Area-P 1/2: 2.6 cm2
Height: 60 in
P 1/2 time: 492 msec
S' Lateral: 2.1 cm
Weight: 1477.96 oz

## 2020-11-29 LAB — PROCALCITONIN: Procalcitonin: 3.45 ng/mL

## 2020-11-29 MED ORDER — POTASSIUM CHLORIDE 20 MEQ PO PACK
40.0000 meq | PACK | ORAL | Status: AC
  Administered 2020-11-29 (×2): 40 meq via ORAL
  Filled 2020-11-29 (×2): qty 2

## 2020-11-29 MED ORDER — FUROSEMIDE 10 MG/ML IJ SOLN
40.0000 mg | Freq: Once | INTRAMUSCULAR | Status: AC
  Administered 2020-11-29: 40 mg via INTRAVENOUS
  Filled 2020-11-29: qty 4

## 2020-11-29 MED ORDER — FUROSEMIDE 10 MG/ML IJ SOLN
20.0000 mg | Freq: Once | INTRAMUSCULAR | Status: AC
  Administered 2020-11-29: 20 mg via INTRAVENOUS
  Filled 2020-11-29: qty 2

## 2020-11-29 MED ORDER — HYDRALAZINE HCL 20 MG/ML IJ SOLN
5.0000 mg | Freq: Once | INTRAMUSCULAR | Status: DC
  Filled 2020-11-29: qty 1

## 2020-11-29 MED ORDER — LIP MEDEX EX OINT
TOPICAL_OINTMENT | CUTANEOUS | Status: DC | PRN
  Filled 2020-11-29: qty 7

## 2020-11-29 NOTE — Progress Notes (Signed)
PROGRESS NOTE    Katrina Burnett   QIH:[id number]  DOB: 1924/09/04  PCP: [name], DO    DOA: 11/27/2020 LOS: 2   Assessment & Plan   Principal Problem:   Sepsis ([place]) Active Problems:   Depression with anxiety   Acute respiratory failure with hypoxia ([place])   Multifocal pneumonia   Protein-calorie malnutrition, severe   Acute CHF (congestive heart failure) ([place])   Underweight   Sepsis secondary to multifocal pneumonia causing acute respiratory failure with hypoxia: Suspect patient aspirating, possibly oversedated with anxiety medications. See below.  Patient met criteria for sepsis on admission given leukocytosis, tachypnea and tachycardia.  Severe sepsis with lactic acidosis developed following admission.   --SLP following --Continue Zosyn --Aspiration precautions --Gentle diuresis for volume overload --Wean o2 as tolerated, keep sats above 90%  Depression with anxiety - on multiple medications including trazodone and Ativan and Neurontin, this could contribute to sedation and subsequent aspiration.   Doses of all 3 have been decreased.   It is unclear if she has any underlying dementia   Protein-calorie malnutrition, severe: Seen by nutrition.  Nutrition Status: Nutrition Problem: Severe Malnutrition Etiology: chronic illness Signs/Symptoms: severe fat depletion, severe muscle depletion Interventions: Ensure Enlive (each supplement provides 350kcal and 20 grams of protein), MVI, Magic cup   Acute kidney injury: Secondary to sepsis, resolved   Acute Diastolic CHF - no prior history of CHF.   Last echocardiogram in 2014 normal.  Have  Echocardiogram this admission: EF 87-56%, grade 1 diastolic dysfunction, no significant valvular disease  --cautious diuresis - got 40 IV Lasix aaround 230 AM, will give another 20 mg today and monitor --daily weights & I/O's --wean o2 as able    Underweight: Body mass index is 18.04 kg/m.   DVT prophylaxis: SCDs Start: 11/27/20  1950   Diet:  Diet Orders (From admission, onward)     Start     Ordered   11/29/20 1402  DIET DYS 3 Room service appropriate? Yes; Fluid consistency: Thin  Diet effective now       Comments: Heart Healthy  Question Answer Comment  Room service appropriate? Yes   Fluid consistency: Thin      11/29/20 1402              Code Status: DNR   Brief Narrative / Hospital Course to Date:   87 year old female with past medical history of GERD and anxiety who is mostly wheelchair-bound, brought in by family on the afternoon of 7/21 for weakness and shortness of breath and not feeling herself for several days.  Noted to be much more lethargic.  In the emergency room, patient found to be hypoxic and septic secondary to pneumonia.  Started on fluids and oxygen and antibiotics and admitted to the hospitalist service.  Initial lactic acid level normal at 1.3, became elevated after admission, consistent with severe sepsis.  Pt had some volume overload likely due to fluids with sepsis. Once lactic acidosis resolved, started on cautious diuresis.   Subjective 11/29/20    Pt reports not feeling well.  She denies SOB but endorses ongoing coarse cough.  Denies fever/chills.  States she has not felt well "since moved inside from outside".  Confused.   Disposition Plan & Communication   Status is: Inpatient  Remains inpatient appropriate because:IV treatments appropriate due to intensity of illness or inability to take PO  Dispo: The patient is from: SNF  Anticipated d/c is to: SNF              Patient currently is not medically stable to d/c.   Difficult to place patient No     Consults, Procedures, Significant Events   Consultants:  none  Procedures:  Echo - EF 29-51%,OACZY 1 diastolic dysfunction, no significant valvular disease  Antimicrobials:  Anti-infectives (From admission, onward)    Start     Dose/Rate Route Frequency Ordered Stop   11/28/20 2100  cefTRIAXone  (ROCEPHIN) 1 g in sodium chloride 0.9 % 100 mL IVPB        1 g 200 mL/hr over 30 Minutes Intravenous Every 24 hours 11/28/20 1928     11/28/20 2100  metroNIDAZOLE (FLAGYL) IVPB 500 mg        500 mg 100 mL/hr over 60 Minutes Intravenous Every 8 hours 11/28/20 1928     11/28/20 1400  ceFEPIme (MAXIPIME) 2 g in sodium chloride 0.9 % 100 mL IVPB  Status:  Discontinued        2 g 200 mL/hr over 30 Minutes Intravenous Every 24 hours 11/27/20 1656 11/28/20 1851   11/27/20 1545  ceFEPIme (MAXIPIME) 2 g in sodium chloride 0.9 % 100 mL IVPB        2 g 200 mL/hr over 30 Minutes Intravenous  Once 11/27/20 1539 11/27/20 1657   11/27/20 1545  vancomycin (VANCOREADY) IVPB 750 mg/150 mL        750 mg 150 mL/hr over 60 Minutes Intravenous  Once 11/27/20 1539 11/28/20 0700         Micro    Objective   Vitals:   11/29/20 0235 11/29/20 0337 11/29/20 0452 11/29/20 0835  BP: (!) 167/88 (!) 155/83 (!) 138/56   Pulse: 86 79 (!) 54   Resp:   14   Temp:   100.2 F (37.9 C)   TempSrc:   Oral   SpO2:   97% 95%  Weight:      Height:        Intake/Output Summary (Last 24 hours) at 11/29/2020 1603 Last data filed at 11/29/2020 1200 Gross per 24 hour  Intake 2119.26 ml  Output 2650 ml  Net -530.74 ml   Filed Weights   11/27/20 1405 11/27/20 1934  Weight: 45.4 kg 41.9 kg    Physical Exam:  General exam: awake, alert, no acute distress, frail Respiratory system: coarse rhonchi throughout, normal respiratory effort, on 2 L/min O2. Cardiovascular system: normal S1/S2, RRR, no pedal edema.   Gastrointestinal system: soft, NT, ND Central nervous system: no gross focal neurologic deficits, normal speech Skin: dry, intact, normal temperature   Labs   Data Reviewed: I have personally reviewed following labs and imaging studies  CBC: Recent Labs  Lab 11/27/20 1409 11/28/20 0515 11/29/20 0530  WBC 13.4* 9.8 9.8  NEUTROABS 11.5*  --   --   HGB 13.0 10.3* 11.7*  HCT 41.0 33.5* 36.7  MCV  95.3 96.5 93.9  PLT 320 252 606   Basic Metabolic Panel: Recent Labs  Lab 11/27/20 1409 11/27/20 2018 11/28/20 0515 11/29/20 0530  NA 140  --  141 142  K 4.5  --  3.8 2.9*  CL 105  --  110 106  CO2 25  --  22 26  GLUCOSE 130*  --  107* 115*  BUN 24*  --  20 28*  CREATININE 1.24*  --  0.81 0.89  CALCIUM 9.1  --  8.4* 9.3  MG  --  2.0  --   --    GFR: Estimated Creatinine Clearance: 25.6 mL/min (by C-G formula based on SCr of 0.89 mg/dL). Liver Function Tests: Recent Labs  Lab 11/27/20 1409 11/28/20 0515 11/29/20 0530  AST 36 23 32  ALT 14 13 18   ALKPHOS 72 48 75  BILITOT 0.8 0.4 0.4  PROT 6.6 5.2* 6.6  ALBUMIN 3.2* 2.5* 3.3*   No results for input(s): LIPASE, AMYLASE in the last 168 hours. No results for input(s): AMMONIA in the last 168 hours. Coagulation Profile: Recent Labs  Lab 11/27/20 1409  INR 1.0   Cardiac Enzymes: No results for input(s): CKTOTAL, CKMB, CKMBINDEX, TROPONINI in the last 168 hours. BNP (last 3 results) No results for input(s): PROBNP in the last 8760 hours. HbA1C: No results for input(s): HGBA1C in the last 72 hours. CBG: No results for input(s): GLUCAP in the last 168 hours. Lipid Profile: No results for input(s): CHOL, HDL, LDLCALC, TRIG, CHOLHDL, LDLDIRECT in the last 72 hours. Thyroid Function Tests: No results for input(s): TSH, T4TOTAL, FREET4, T3FREE, THYROIDAB in the last 72 hours. Anemia Panel: No results for input(s): VITAMINB12, FOLATE, FERRITIN, TIBC, IRON, RETICCTPCT in the last 72 hours. Sepsis Labs: Recent Labs  Lab 11/27/20 2018 11/28/20 1400 11/28/20 2055 11/29/20 0530  PROCALCITON  --  4.09  --  3.45  LATICACIDVEN 2.8* 2.3* 1.6 1.3    Recent Results (from the past 240 hour(s))  Culture, blood (routine x 2)     Status: None (Preliminary result)   Collection Time: 11/27/20  2:09 PM   Specimen: BLOOD  Result Value Ref Range Status   Specimen Description   Final    BLOOD LEFT ARM Performed at [facility] [address], [place], [place] [address]    Special Requests   Final    BOTTLES DRAWN AEROBIC AND ANAEROBIC Blood Culture adequate volume Performed at [facility] [address] [place], [place] [address]    Culture   Final    NO GROWTH 2 DAYS Performed at [facility] [address], [place], [place] [address]    Report Status PENDING  Incomplete  Culture, blood (routine x 2)     Status: None (Preliminary result)   Collection Time: 11/27/20  2:14 PM   Specimen: BLOOD  Result Value Ref Range Status   Specimen Description   Final    BLOOD LEFT ARM Performed at [facility] [address], [place], [place] [address]    Special Requests   Final    BOTTLES DRAWN AEROBIC AND ANAEROBIC Blood Culture adequate volume Performed at [facility] [address] [place], [place] [address]    Culture   Final    NO GROWTH 2 DAYS Performed at [facility] [address], [place], [place] [address]    Report Status PENDING  Incomplete  Resp Panel by RT-PCR (Flu A&B, Covid) Nasopharyngeal Swab     Status: None   Collection Time: 11/27/20  2:27 PM   Specimen: Nasopharyngeal Swab; Nasopharyngeal(NP) swabs in vial transport medium  Result Value Ref Range Status   SARS Coronavirus 2 by RT PCR NEGATIVE NEGATIVE Final    Comment: (NOTE) SARS-CoV-2 target nucleic acids are NOT DETECTED.  The SARS-CoV-2 RNA is generally detectable in upper respiratory specimens during the acute phase of infection. The lowest concentration of SARS-CoV-2 viral copies this assay can detect is 138 copies/mL. A negative result does not preclude SARS-Cov-2  infection and should not be used as the sole basis for treatment or other patient management decisions. A negative result may occur with  improper specimen collection/handling, submission of specimen other than nasopharyngeal swab, presence of viral mutation(s) within  the areas targeted by this assay, and inadequate number of viral copies(<138 copies/mL). A negative result must be combined with clinical observations, patient history, and epidemiological information. The expected result is Negative.  Fact Sheet for Patients:  EntrepreneurPulse.com.au  Fact Sheet for Healthcare Providers:  IncredibleEmployment.be  This test is no t yet approved or cleared by the [place] FDA and  has been authorized for detection and/or diagnosis of SARS-CoV-2 by FDA under an Emergency Use Authorization (EUA). This EUA will remain  in effect (meaning this test can be used) for the duration of the COVID-19 declaration under Section 564(b)(1) of the Act, 21 U.S.C.section 360bbb-3(b)(1), unless the authorization is terminated  or revoked sooner.       Influenza A by PCR NEGATIVE NEGATIVE Final   Influenza B by PCR NEGATIVE NEGATIVE Final    Comment: (NOTE) The Xpert Xpress SARS-CoV-2/FLU/RSV plus assay is intended as an aid in the diagnosis of influenza from Nasopharyngeal swab specimens and should not be used as a sole basis for treatment. Nasal washings and aspirates are unacceptable for Xpert Xpress SARS-CoV-2/FLU/RSV testing.  Fact Sheet for Patients: EntrepreneurPulse.com.au  Fact Sheet for Healthcare Providers: IncredibleEmployment.be  This test is not yet approved or cleared by the [place] FDA and has been authorized for detection and/or diagnosis of SARS-CoV-2 by FDA under an Emergency Use Authorization (EUA). This EUA will remain in effect (meaning this test can be used) for the duration of the COVID-19 declaration under Section 564(b)(1) of the Act, 21 U.S.C. section 360bbb-3(b)(1), unless the authorization is terminated or revoked.  Performed at [facility], [place] [address], [place], [place] [address]   Urine Culture     Status: Abnormal  (Preliminary result)   Collection Time: 11/27/20  6:48 PM   Specimen: In/Out Cath Urine  Result Value Ref Range Status   Specimen Description   Final    IN/OUT CATH URINE Performed at [facility] [address] [place], [place] [address]    Special Requests   Final    NONE Performed at [facility], [place] [address], [place], [place] [address]    Culture (A)  Final    >=100,000 COLONIES/mL ENTEROCOCCUS FAECALIS CULTURE REINCUBATED FOR BETTER GROWTH Performed at [facility], [place] [address], [place], [place] [address]    Report Status PENDING  Incomplete  MRSA Next Gen by PCR, Nasal     Status: None   Collection Time: 11/27/20  7:50 PM   Specimen: Nasal Mucosa; Nasal Swab  Result Value Ref Range Status   MRSA by PCR Next Gen NOT DETECTED NOT DETECTED Final    Comment: (NOTE) The GeneXpert MRSA Assay (FDA approved for NASAL specimens only), is one component of a comprehensive MRSA colonization surveillance program. It is not intended to diagnose MRSA infection nor to guide or monitor treatment for MRSA infections. Test performance is not FDA approved in patients less than 49 years old. Performed at [facility], [place] [address], [place], [place] [address]       Imaging Studies   DG Chest 1 View  Result Date: 11/29/2020 CLINICAL DATA:  Increased shortness of breath and wheezing. EXAM: CHEST  1 VIEW COMPARISON:  November 27, 2020 FINDINGS: Mild  infiltrates are seen within the bilateral lung bases and upper right lung. This is mildly increased in severity when compared to the prior study. There is no evidence of a pleural effusion or pneumothorax. The heart size and mediastinal contours are within normal limits. There is mild calcification of the aortic arch. Prior vertebroplasty is noted within the mid and lower thoracic spine. A chronic ninth left rib fracture is seen. IMPRESSION: Mild right upper lobe and bibasilar  infiltrates, mildly increased in severity when compared to the prior study Electronically Signed   By: Virgina Norfolk M.D.   On: 11/29/2020 03:32   ECHOCARDIOGRAM COMPLETE  Result Date: 11/29/2020    ECHOCARDIOGRAM REPORT   Patient Name:   Katrina Burnett Date of Exam: 11/29/2020 Medical Rec #:  [id number]     Height:       60.0 in Accession #:    [id number]    Weight:       92.4 lb Date of Birth:  Sep 17, 1924      BSA:          1.344 m Patient Age:    77 years      BP:           138/56 mmHg Patient Gender: F             HR:           84 bpm. Exam Location:  Inpatient Procedure: 2D Echo, Color Doppler and Cardiac Doppler Indications:    CHF-Acute Diastolic B44.96  History:        Patient has prior history of Echocardiogram examinations, most                 recent 05/31/2012. PAD and Stroke; Risk Factors:Diabetes.  Sonographer:    Bernadene Person RDCS Referring Phys: [place]  1. Left ventricular ejection fraction, by estimation, is 60 to 65%. The left ventricle has normal function. The left ventricle has no regional wall motion abnormalities. Left ventricular diastolic parameters are consistent with Grade I diastolic dysfunction (impaired relaxation).  2. Right ventricular systolic function is normal. The right ventricular size is normal. Tricuspid regurgitation signal is inadequate for assessing PA pressure.  3. The mitral valve is normal in structure. Trivial mitral valve regurgitation. No evidence of mitral stenosis.  4. The aortic valve is tricuspid. Aortic valve regurgitation is mild. Mild aortic valve sclerosis is present, with no evidence of aortic valve stenosis. Aortic regurgitation PHT measures 492 msec.  5. The inferior vena cava is normal in size with <50% respiratory variability, suggesting right atrial pressure of 8 mmHg. FINDINGS  Left Ventricle: Left ventricular ejection fraction, by estimation, is 60 to 65%. The left ventricle has normal function. The left ventricle has no  regional wall motion abnormalities. The left ventricular internal cavity size was normal in size. There is  no left ventricular hypertrophy. Left ventricular diastolic parameters are consistent with Grade I diastolic dysfunction (impaired relaxation). Normal left ventricular filling pressure. Right Ventricle: The right ventricular size is normal. No increase in right ventricular wall thickness. Right ventricular systolic function is normal. Tricuspid regurgitation signal is inadequate for assessing PA pressure. Left Atrium: Left atrial size was normal in size. Right Atrium: Right atrial size was normal in size. Pericardium: There is no evidence of pericardial effusion. Mitral Valve: The mitral valve is normal in structure. Mild mitral annular calcification. Trivial mitral valve regurgitation. No evidence of mitral valve stenosis. Tricuspid Valve: The tricuspid valve is normal in  structure. Tricuspid valve regurgitation is not demonstrated. No evidence of tricuspid stenosis. Aortic Valve: The aortic valve is tricuspid. Aortic valve regurgitation is mild. Aortic regurgitation PHT measures 492 msec. Mild aortic valve sclerosis is present, with no evidence of aortic valve stenosis. Pulmonic Valve: The pulmonic valve was normal in structure. Pulmonic valve regurgitation is not visualized. No evidence of pulmonic stenosis. Aorta: The aortic root is normal in size and structure. Venous: The inferior vena cava is normal in size with less than 50% respiratory variability, suggesting right atrial pressure of 8 mmHg. IAS/Shunts: No atrial level shunt detected by color flow Doppler.  LEFT VENTRICLE PLAX 2D LVIDd:         4.20 cm  Diastology LVIDs:         2.10 cm  LV e' medial:    4.50 cm/s LV PW:         0.90 cm  LV E/e' medial:  15.2 LV IVS:        0.90 cm  LV e' lateral:   8.15 cm/s LVOT diam:     1.80 cm  LV E/e' lateral: 8.4 LV SV:         66 LV SV Index:   49 LVOT Area:     2.54 cm  RIGHT VENTRICLE RV S prime:     15.80  cm/s TAPSE (M-mode): 1.7 cm LEFT ATRIUM           Index       RIGHT ATRIUM           Index LA diam:      2.20 cm 1.64 cm/m  RA Area:     15.40 cm LA Vol (A2C): 29.8 ml 22.17 ml/m RA Volume:   38.40 ml  28.57 ml/m LA Vol (A4C): 23.4 ml 17.41 ml/m  AORTIC VALVE LVOT Vmax:   129.00 cm/s LVOT Vmean:  88.800 cm/s LVOT VTI:    0.261 m AI PHT:      492 msec  AORTA Ao Root diam: 3.60 cm Ao Asc diam:  3.00 cm MITRAL VALVE MV Area (PHT): 2.60 cm    SHUNTS MV Decel Time: 292 msec    Systemic VTI:  0.26 m MV E velocity: 68.40 cm/s  Systemic Diam: 1.80 cm MV A velocity: 82.00 cm/s MV E/A ratio:  0.83 Fransico Him MD Electronically signed by Fransico Him MD Signature Date/Time: 11/29/2020/11:16:57 AM    Final      Medications   Scheduled Meds:  acidophilus  1 capsule Oral QHS   albuterol  2.5 mg Nebulization Q4H WA   dorzolamide  1 drop Both Eyes BID   feeding supplement  237 mL Oral BID BM   ferrous sulfate  325 mg Oral BID WC   gabapentin  300 mg Oral QHS   guaiFENesin  600 mg Oral BID   hydrALAZINE  5 mg Intravenous Once   latanoprost  1 drop Both Eyes QHS   LORazepam  1 mg Oral TID   multivitamin with minerals  1 tablet Oral QHS   pantoprazole  40 mg Oral Daily   potassium chloride  40 mEq Oral Q4H   traZODone  25 mg Oral QHS   Continuous Infusions:  cefTRIAXone (ROCEPHIN)  IV 1 g (11/28/20 2223)   metronidazole 500 mg (11/29/20 1218)       LOS: 2 days    Time spent: 30 minutes with> 50% spent at bedside and in coordination of care.    Ezekiel Slocumb, DO [facility]  11/29/2020, 4:03 PM      If 7PM-7AM, please contact night-coverage. How to contact the [facility] Attending or Consulting provider [place] or covering provider during after hours [place], for this patient?    Check the care team in [facility] and look for a) attending/consulting [facility] provider listed and b) the [facility] team listed Log into [email or web address] and use [place]'s universal password to access. If you do not have  the password, please contact the hospital operator. Locate the [facility] provider you are looking for under [facility] and page to a number that you can be directly reached. If you still have difficulty reaching the provider, please page the [facility] (Director on Call) for the Hospitalists listed on amion for assistance.

## 2020-11-29 NOTE — Evaluation (Signed)
Clinical/Bedside Swallow Evaluation Patient Details  Name: Katrina Burnett MRN: [id number] Date of Birth: [birth year]  Today's Date: 11/29/2020 Time: SLP Start Time (ACUTE ONLY): [id number] SLP Stop Time (ACUTE ONLY): [id number] SLP Time Calculation (min) (ACUTE ONLY): 18 min  Past Medical History:  Past Medical History:  Diagnosis Date   Allergic rhinitis    Anxiety    Arthritis    "hands" (11/25/2016)   Barrett's esophagus 06/1997   CAP (community acquired pneumonia) 11/25/2016   "this is my 3rd time having pneumonia" (11/25/2016)   Chondromalacia    Chronic lower back pain    Depression    Diverticulosis    GERD (gastroesophageal reflux disease)    Glaucoma    History of hiatal hernia    History of stomach ulcers    Lumbar compression fracture ([place])    L1 and L2   Osteoporosis    Pneumonia    Polymyalgia ([place])    Stroke ([place])    Tubular adenoma of colon 06/1999   Vaginitis    Varicose veins    Past Surgical History:  Past Surgical History:  Procedure Laterality Date   BACK SURGERY     BIOPSY  11/19/2019   Procedure: BIOPSY;  Surgeon: Gatha Mayer, MD;  Location: [facility] ENDOSCOPY;  Service: Endoscopy;;   CATARACT EXTRACTION W/ INTRAOCULAR LENS  IMPLANT, BILATERAL Bilateral    ESOPHAGOGASTRODUODENOSCOPY (EGD) WITH PROPOFOL N/A 11/19/2019   Procedure: ESOPHAGOGASTRODUODENOSCOPY (EGD) WITH PROPOFOL;  Surgeon: Gatha Mayer, MD;  Location: [facility] ENDOSCOPY;  Service: Endoscopy;  Laterality: N/A;   FIXATION KYPHOPLASTY LUMBAR SPINE  10/12/10; 09/20/11   FRACTURE SURGERY     IR KYPHO THORACIC WITH BONE BIOPSY  03/24/2018   IR VERTEBROPLASTY CERV/THOR BX INC UNI/BIL INC/INJECT/IMAGING  03/24/2018   LUMBAR SPINE SURGERY     "did OR on 2 fractures in my lower back"   LUNG BIOPSY     ORIF HIP FRACTURE Right 05/23/2015   Procedure: OPEN REDUCTION INTERNAL FIXATION HIP;  Surgeon: Frederik Pear, MD;  Location: [place];  Service: Orthopedics;  Laterality: Right;   VARICOSE VEIN SURGERY     vein ligation and  stripping "years ago"   HPI:  Katrina Burnett is a 87 y.o. female with medical history significant of GERD, anxiety, arthritis, barrett's esophagus ,esophagitis, polymyalgia, pelvic fx.  Presenting with weakness and hypoxia.  MBS completed on 09/10/20 with reported functional oropharyngeal swallow mechanism and recommendations for regular solids and thin liquids.   Assessment / Plan / Recommendation Clinical Impression  Pt was seen for a bedsides swallow evaluation and she presents with suspected mild oropharyngeal dysphagia.  Pt was encountered awake/alert and she was agreeable to this evaluation.  Pt's son was present for the end of this evaluation and stated that she had shown improvement from yesterday.  Oral mech examination was remarkable for generalized oral motor weakness and a dark coating on the pt's lingual surface.  Pt consumed trials of thin liquid, nectar-thick liquid, puree, and regular solids.  Pt was noted to have intermittent delayed throat clearing with thin liquid, nectar-thick liquid, and regular solids; however, pt has a hx of GERD and she was noted to have baseline throat clear/cough in the absence of PO.  Suspect that this may also be related to current fluid overload (pt is on lasix).  No overt s/sx of aspiration observed with puree.  Mastication of regular solids was mildly prolonged with trace-mild oral residue noted.  Recommend diet change to Dysphagia 3 (soft) solids and  thin liquids with medications administered crushed in puree.  SLP will briefly f/u to monitor diet tolerance.  SLP Visit Diagnosis: Dysphagia, unspecified (R13.10)    Aspiration Risk  Mild aspiration risk    Diet Recommendation Dysphagia 3 (Mech soft);Thin liquid   Liquid Administration via: Cup;Straw Medication Administration: Crushed with puree Supervision: Intermittent supervision to cue for compensatory strategies;Staff to assist with self feeding Compensations: Slow rate;Small sips/bites;Minimize  environmental distractions Postural Changes: Seated upright at 90 degrees;Remain upright for at least 30 minutes after po intake    Other  Recommendations Oral Care Recommendations: Oral care BID;Staff/trained caregiver to provide oral care   Follow up Recommendations 24 hour supervision/assistance      Frequency and Duration min 2x/week  2 weeks       Prognosis Prognosis for Safe Diet Advancement: Fair Barriers to Reach Goals: Cognitive deficits      Swallow Study   General HPI: Katrina Burnett is a 87 y.o. female with medical history significant of GERD, anxiety, arthritis, barrett's esophagus ,esophagitis, polymyalgia, pelvic fx.  Presenting with weakness and hypoxia.  MBS completed on 09/10/20 with reported functional oropharyngeal swallow mechanism and recommendations for regular solids and thin liquids. Type of Study: Bedside Swallow Evaluation Previous Swallow Assessment: See HPI Diet Prior to this Study: Regular;Nectar-thick liquids Temperature Spikes Noted: Yes Respiratory Status: Nasal cannula History of Recent Intubation: No Behavior/Cognition: Alert;Cooperative;Pleasant mood Oral Cavity Assessment: Dry;Other (comment) (dark coating noted on lingual surface) Oral Care Completed by SLP: No Oral Cavity - Dentition: Adequate natural dentition Vision: Functional for self-feeding Self-Feeding Abilities: Needs assist Patient Positioning: Upright in chair Baseline Vocal Quality: Low vocal intensity Volitional Cough: Weak;Congested Volitional Swallow: Able to elicit    Oral/Motor/Sensory Function Overall Oral Motor/Sensory Function: Generalized oral weakness Facial ROM: Within Functional Limits Facial Symmetry: Within Functional Limits Facial Sensation: Within Functional Limits Lingual ROM: Reduced right;Reduced left Lingual Symmetry: Within Functional Limits Lingual Strength: Reduced Mandible: Within Functional Limits   Ice Chips Ice chips: Not tested   Thin Liquid Thin  Liquid: Impaired Presentation: Straw Pharyngeal  Phase Impairments: Throat Clearing - Delayed    Nectar Thick Nectar Thick Liquid: Impaired Presentation: Straw Pharyngeal Phase Impairments: Throat Clearing - Delayed   Honey Thick Honey Thick Liquid: Not tested   Puree Puree: Within functional limits   Solid     Solid: Impaired Presentation: Spoon Oral Phase Impairments: Impaired mastication Oral Phase Functional Implications: Prolonged oral transit;Impaired mastication;Oral residue Pharyngeal Phase Impairments: Throat Clearing - Delayed     Colin Mulders M.S., CCC-SLP Acute Rehabilitation Services Office: [phone]  [place] 11/29/2020,2:12 PM

## 2020-11-29 NOTE — Progress Notes (Signed)
  Echocardiogram 2D Echocardiogram has been performed.  Katrina Burnett 11/29/2020, 9:21 AM

## 2020-11-30 DIAGNOSIS — E43 Unspecified severe protein-calorie malnutrition: Secondary | ICD-10-CM | POA: Diagnosis not present

## 2020-11-30 DIAGNOSIS — J189 Pneumonia, unspecified organism: Secondary | ICD-10-CM | POA: Diagnosis not present

## 2020-11-30 DIAGNOSIS — A419 Sepsis, unspecified organism: Secondary | ICD-10-CM | POA: Diagnosis not present

## 2020-11-30 DIAGNOSIS — I5031 Acute diastolic (congestive) heart failure: Secondary | ICD-10-CM | POA: Diagnosis not present

## 2020-11-30 LAB — BASIC METABOLIC PANEL
Anion gap: 12 (ref 5–15)
BUN: 23 mg/dL (ref 8–23)
CO2: 26 mmol/L (ref 22–32)
Calcium: 9.5 mg/dL (ref 8.9–10.3)
Chloride: 104 mmol/L (ref 98–111)
Creatinine, Ser: 0.74 mg/dL (ref 0.44–1.00)
GFR, Estimated: >60 mL/min (ref >60)
Glucose, Bld: 96 mg/dL (ref 70–99)
Potassium: 2.6 mmol/L — CL (ref 3.5–5.1)
Sodium: 142 mmol/L (ref 135–145)

## 2020-11-30 LAB — MAGNESIUM: Magnesium: 1.9 mg/dL (ref 1.7–2.4)

## 2020-11-30 LAB — LEGIONELLA PNEUMOPHILA SEROGP 1 UR AG: L. pneumophila Serogp 1 Ur Ag: NEGATIVE

## 2020-11-30 LAB — POTASSIUM: Potassium: 3.1 mmol/L — ABNORMAL LOW (ref 3.5–5.1)

## 2020-11-30 MED ORDER — ONDANSETRON HCL 4 MG/2ML IJ SOLN
4.0000 mg | Freq: Three times a day (TID) | INTRAMUSCULAR | Status: DC
  Administered 2020-11-30 – 2020-12-02 (×6): 4 mg via INTRAVENOUS
  Filled 2020-11-30 (×6): qty 2

## 2020-11-30 MED ORDER — METRONIDAZOLE 500 MG PO TABS
500.0000 mg | ORAL_TABLET | Freq: Three times a day (TID) | ORAL | Status: DC
  Administered 2020-11-30: 500 mg via ORAL
  Filled 2020-11-30 (×2): qty 1

## 2020-11-30 MED ORDER — POTASSIUM CHLORIDE CRYS ER 20 MEQ PO TBCR
40.0000 meq | EXTENDED_RELEASE_TABLET | ORAL | Status: DC
  Filled 2020-11-30: qty 2

## 2020-11-30 MED ORDER — HYDRALAZINE HCL 20 MG/ML IJ SOLN
5.0000 mg | Freq: Once | INTRAMUSCULAR | Status: AC
  Administered 2020-12-01: 5 mg via INTRAVENOUS
  Filled 2020-11-30: qty 1

## 2020-11-30 MED ORDER — CEFDINIR 300 MG PO CAPS
300.0000 mg | ORAL_CAPSULE | Freq: Two times a day (BID) | ORAL | Status: DC
  Administered 2020-12-01 – 2020-12-02 (×4): 300 mg via ORAL
  Filled 2020-11-30 (×6): qty 1

## 2020-11-30 MED ORDER — POTASSIUM CHLORIDE 20 MEQ PO PACK
40.0000 meq | PACK | ORAL | Status: AC
  Administered 2020-11-30 (×2): 40 meq via ORAL
  Filled 2020-11-30 (×2): qty 2

## 2020-11-30 NOTE — Progress Notes (Signed)
Date and time results received: 11/30/20 0703 (use smartphrase ".now" to insert current time)  Test: Potassium Critical Value: 2.6  Name of Provider Notified: Arbutus Ped  Orders Received? Or Actions Taken?: MD contacted at 0710, awaiting orders

## 2020-11-30 NOTE — Progress Notes (Signed)
SLP Cancellation Note  Patient Details Name: Katrina Burnett MRN: [id number] DOB: April 27, 1925   Cancelled treatment:       Reason Eval/Treat Not Completed: Patient at procedure or test/unavailable.  Pt is currently being bathed with nursing staff.  SLP will f/u as schedule allows.    [name] 11/30/2020, 3:29 PM

## 2020-11-30 NOTE — Progress Notes (Signed)
PROGRESS NOTE    Katrina Burnett   PNT:[id number]  DOB: Feb 16, 1925  PCP: [name], DO    DOA: 11/27/2020 LOS: 3   Assessment & Plan   Principal Problem:   Sepsis ([place]) Active Problems:   Depression with anxiety   Acute respiratory failure with hypoxia ([place])   Multifocal pneumonia   Protein-calorie malnutrition, severe   Acute CHF (congestive heart failure) ([place])   Underweight   Sepsis secondary to multifocal pneumonia causing acute respiratory failure with hypoxia: Suspect patient aspirating, possibly oversedated with anxiety medications. See below.  Patient met criteria for sepsis on admission given leukocytosis, tachypnea and tachycardia.  Severe sepsis with lactic acidosis developed following admission.   --SLP following --Change Rocephin & Flagyl to PO to limit volume --Aspiration precautions --Gentle diuresis intermittent for volume overload Net IO Since Admission: 2,640.2 mL [11/30/20 1650] --Wean o2 as tolerated, keep sats above 90%  Hypokalemia - critical K 2.6 this AM.  Due to diuresis. Replacing.  Hold further diuresis for now.  Depression with anxiety - on multiple medications including trazodone and Ativan and Neurontin, this could contribute to sedation and subsequent aspiration.   Doses of all 3 have been decreased.   It is unclear if she has any underlying dementia.   She seems mildly confused at times but communicates appropriately.     Protein-calorie malnutrition, severe: Seen by nutrition.  Nutrition Status: Nutrition Problem: Severe Malnutrition Etiology: chronic illness Signs/Symptoms: severe fat depletion, severe muscle depletion Interventions: Ensure Enlive (each supplement provides 350kcal and 20 grams of protein), MVI, Magic cup   Acute kidney injury: Secondary to sepsis, resolved   Acute Diastolic CHF - no prior history of CHF.   Last echocardiogram in 2014 normal.  Have  Echocardiogram this admission: EF 08-67%, grade 1 diastolic  dysfunction, no significant valvular disease  --cautious diuresis - given 40 IV Lasix and another 20 mg on 7/23.  Hypokalemic so hold further Lasix for now.  Will resume later today or tomorrow  --daily weights & I/O's --wean o2 as able    Underweight: Body mass index is 19.5 kg/m.   DVT prophylaxis: SCDs Start: 11/27/20 1950   Diet:  Diet Orders (From admission, onward)     Start     Ordered   11/30/20 0626  DIET DYS 3 Room service appropriate? No; Fluid consistency: Thin  Diet effective now       Comments: Heart Healthy  Question Answer Comment  Room service appropriate? No   Fluid consistency: Thin      11/30/20 0626              Code Status: DNR   Brief Narrative / Hospital Course to Date:   87 year old female with past medical history of GERD and anxiety who is mostly wheelchair-bound, brought in by family on the afternoon of 7/21 for weakness and shortness of breath and not feeling herself for several days.  Noted to be much more lethargic.  In the emergency room, patient found to be hypoxic and septic secondary to pneumonia.  Started on fluids and oxygen and antibiotics and admitted to the hospitalist service.  Initial lactic acid level normal at 1.3, became elevated after admission, consistent with severe sepsis.  Pt had some volume overload likely due to fluids with sepsis. Once lactic acidosis resolved, started on cautious diuresis.   Subjective 11/30/20    Pt sitting up awake in bed.  RN getting meds ready for her.  Pt reports having a headache this  morning.  Denies SOB (doesn't think so).  Does continue to have a fairly productive-sounding cough. She denies fevers or chills.     Disposition Plan & Communication   Status is: Inpatient  Remains inpatient appropriate because:IV treatments appropriate due to intensity of illness or inability to take PO  Dispo: The patient is from: SNF              Anticipated d/c is to: SNF              Patient currently is  not medically stable to d/c.   Difficult to place patient No     Consults, Procedures, Significant Events   Consultants:  none  Procedures:  Echo - EF 99-83%,JASNK 1 diastolic dysfunction, no significant valvular disease  Antimicrobials:  Anti-infectives (From admission, onward)    Start     Dose/Rate Route Frequency Ordered Stop   11/30/20 2200  cefdinir (OMNICEF) capsule 300 mg        300 mg Oral Every 12 hours 11/30/20 0851     11/30/20 1400  metroNIDAZOLE (FLAGYL) tablet 500 mg        500 mg Oral Every 8 hours 11/30/20 0850     11/28/20 2100  cefTRIAXone (ROCEPHIN) 1 g in sodium chloride 0.9 % 100 mL IVPB  Status:  Discontinued        1 g 200 mL/hr over 30 Minutes Intravenous Every 24 hours 11/28/20 1928 11/30/20 0848   11/28/20 2100  metroNIDAZOLE (FLAGYL) IVPB 500 mg  Status:  Discontinued        500 mg 100 mL/hr over 60 Minutes Intravenous Every 8 hours 11/28/20 1928 11/30/20 0848   11/28/20 1400  ceFEPIme (MAXIPIME) 2 g in sodium chloride 0.9 % 100 mL IVPB  Status:  Discontinued        2 g 200 mL/hr over 30 Minutes Intravenous Every 24 hours 11/27/20 1656 11/28/20 1851   11/27/20 1545  ceFEPIme (MAXIPIME) 2 g in sodium chloride 0.9 % 100 mL IVPB        2 g 200 mL/hr over 30 Minutes Intravenous  Once 11/27/20 1539 11/27/20 1657   11/27/20 1545  vancomycin (VANCOREADY) IVPB 750 mg/150 mL        750 mg 150 mL/hr over 60 Minutes Intravenous  Once 11/27/20 1539 11/28/20 0700         Micro    Objective   Vitals:   11/30/20 0510 11/30/20 0700 11/30/20 0749 11/30/20 1528  BP: (!) 179/83 (!) 177/80  (!) 148/76  Pulse: 77 70  66  Resp: 16   15  Temp: 98.3 F (36.8 C)   98.4 F (36.9 C)  TempSrc: Oral   Oral  SpO2: 99%  94% 95%  Weight: 45.3 kg     Height:        Intake/Output Summary (Last 24 hours) at 11/30/2020 1635 Last data filed at 11/30/2020 1535 Gross per 24 hour  Intake 486.22 ml  Output 750 ml  Net -263.78 ml   Filed Weights   11/27/20 1405  11/27/20 1934 11/30/20 0510  Weight: 45.4 kg 41.9 kg 45.3 kg    Physical Exam:  General exam: awake, alert, no acute distress, frail Respiratory system: coughs with inspiration on auscultation, less coarse than yesterday, normal respiratory effort, on 2 L/min O2. Cardiovascular system: normal S1/S2, RRR, no pedal edema.   Gastrointestinal system: soft, NT, ND Central nervous system: no gross focal neurologic deficits, normal speech   Labs   Data Reviewed:  I have personally reviewed following labs and imaging studies  CBC: Recent Labs  Lab 11/27/20 1409 11/28/20 0515 11/29/20 0530  WBC 13.4* 9.8 9.8  NEUTROABS 11.5*  --   --   HGB 13.0 10.3* 11.7*  HCT 41.0 33.5* 36.7  MCV 95.3 96.5 93.9  PLT 320 252 468   Basic Metabolic Panel: Recent Labs  Lab 11/27/20 1409 11/27/20 2018 11/28/20 0515 11/29/20 0530 11/30/20 0623  NA 140  --  141 142 142  K 4.5  --  3.8 2.9* 2.6*  CL 105  --  110 106 104  CO2 25  --  _0 GLUCOSE 130*  --  107* 115* 96  BUN 24*  --  20 28* 23  CREATININE 1.24*  --  0.81 0.89 0.74  CALCIUM 9.1  --  8.4* 9.3 9.5  MG  --  2.0  --   --  1.9   GFR: Estimated Creatinine Clearance: 30.8 mL/min (by C-G formula based on SCr of 0.74 mg/dL). Liver Function Tests: Recent Labs  Lab 11/27/20 1409 11/28/20 0515 11/29/20 0530  AST 36 23 32  ALT _1 ALKPHOS 72 48 75  BILITOT 0.8 0.4 0.4  PROT 6.6 5.2* 6.6  ALBUMIN 3.2* 2.5* 3.3*   No results for input(s): LIPASE, AMYLASE in the last 168 hours. No results for input(s): AMMONIA in the last 168 hours. Coagulation Profile: Recent Labs  Lab 11/27/20 1409  INR 1.0   Cardiac Enzymes: No results for input(s): CKTOTAL, CKMB, CKMBINDEX, TROPONINI in the last 168 hours. BNP (last 3 results) No results for input(s): PROBNP in the last 8760 hours. HbA1C: No results for input(s): HGBA1C in the last 72 hours. CBG: No results for input(s): GLUCAP in the last 168 hours. Lipid Profile: No  results for input(s): CHOL, HDL, LDLCALC, TRIG, CHOLHDL, LDLDIRECT in the last 72 hours. Thyroid Function Tests: No results for input(s): TSH, T4TOTAL, FREET4, T3FREE, THYROIDAB in the last 72 hours. Anemia Panel: No results for input(s): VITAMINB12, FOLATE, FERRITIN, TIBC, IRON, RETICCTPCT in the last 72 hours. Sepsis Labs: Recent Labs  Lab 11/27/20 2018 11/28/20 1400 11/28/20 2055 11/29/20 0530  PROCALCITON  --  4.09  --  3.45  LATICACIDVEN 2.8* 2.3* 1.6 1.3    Recent Results (from the past 240 hour(s))  Culture, blood (routine x 2)     Status: None (Preliminary result)   Collection Time: 11/27/20  2:09 PM   Specimen: BLOOD  Result Value Ref Range Status   Specimen Description   Final    BLOOD LEFT ARM Performed at [facility] [address] [place], [place] [address]    Special Requests   Final    BOTTLES DRAWN AEROBIC AND ANAEROBIC Blood Culture adequate volume Performed at [facility] [address] [place], [place] [address]    Culture   Final    NO GROWTH 3 DAYS Performed at [facility], [place] [address], [place], [place] [address]    Report Status PENDING  Incomplete  Culture, blood (routine x 2)     Status: None (Preliminary result)   Collection Time: 11/27/20  2:14 PM   Specimen: BLOOD  Result Value Ref Range Status   Specimen Description   Final    BLOOD LEFT ARM Performed at [facility] [address] [place], [place] [address]    Special Requests   Final    BOTTLES DRAWN AEROBIC AND ANAEROBIC Blood Culture  adequate volume Performed at [facility] [address] [place], [place] [address]    Culture   Final    NO GROWTH 3 DAYS Performed at [facility], [place] [address] [place], [place] [address]    Report Status PENDING  Incomplete  Resp Panel by RT-PCR (Flu A&B, Covid) Nasopharyngeal Swab     Status: None   Collection Time: 11/27/20  2:27 PM   Specimen:  Nasopharyngeal Swab; Nasopharyngeal(NP) swabs in vial transport medium  Result Value Ref Range Status   SARS Coronavirus 2 by RT PCR NEGATIVE NEGATIVE Final    Comment: (NOTE) SARS-CoV-2 target nucleic acids are NOT DETECTED.  The SARS-CoV-2 RNA is generally detectable in upper respiratory specimens during the acute phase of infection. The lowest concentration of SARS-CoV-2 viral copies this assay can detect is 138 copies/mL. A negative result does not preclude SARS-Cov-2 infection and should not be used as the sole basis for treatment or other patient management decisions. A negative result may occur with  improper specimen collection/handling, submission of specimen other than nasopharyngeal swab, presence of viral mutation(s) within the areas targeted by this assay, and inadequate number of viral copies(<138 copies/mL). A negative result must be combined with clinical observations, patient history, and epidemiological information. The expected result is Negative.  Fact Sheet for Patients:  EntrepreneurPulse.com.au  Fact Sheet for Healthcare Providers:  IncredibleEmployment.be  This test is no t yet approved or cleared by the [place] FDA and  has been authorized for detection and/or diagnosis of SARS-CoV-2 by FDA under an Emergency Use Authorization (EUA). This EUA will remain  in effect (meaning this test can be used) for the duration of the COVID-19 declaration under Section 564(b)(1) of the Act, 21 U.S.C.section 360bbb-3(b)(1), unless the authorization is terminated  or revoked sooner.       Influenza A by PCR NEGATIVE NEGATIVE Final   Influenza B by PCR NEGATIVE NEGATIVE Final    Comment: (NOTE) The Xpert Xpress SARS-CoV-2/FLU/RSV plus assay is intended as an aid in the diagnosis of influenza from Nasopharyngeal swab specimens and should not be used as a sole basis for treatment. Nasal washings and aspirates are unacceptable for  Xpert Xpress SARS-CoV-2/FLU/RSV testing.  Fact Sheet for Patients: EntrepreneurPulse.com.au  Fact Sheet for Healthcare Providers: IncredibleEmployment.be  This test is not yet approved or cleared by the [place] FDA and has been authorized for detection and/or diagnosis of SARS-CoV-2 by FDA under an Emergency Use Authorization (EUA). This EUA will remain in effect (meaning this test can be used) for the duration of the COVID-19 declaration under Section 564(b)(1) of the Act, 21 U.S.C. section 360bbb-3(b)(1), unless the authorization is terminated or revoked.  Performed at [facility], [place] [address] [place], [place] [address]   Urine Culture     Status: Abnormal (Preliminary result)   Collection Time: 11/27/20  6:48 PM   Specimen: In/Out Cath Urine  Result Value Ref Range Status   Specimen Description   Final    IN/OUT CATH URINE Performed at [facility] [address] [place], [place] [address]    Special Requests   Final    NONE Performed at [facility], [place] [address], [place], [place] [address]    Culture (A)  Final    >=100,000 COLONIES/mL ENTEROCOCCUS FAECALIS SUSCEPTIBILITIES TO FOLLOW Performed at [facility], [place] [address], [place], [place] [address]    Report Status PENDING  Incomplete  MRSA Next  Gen by PCR, Nasal     Status: None   Collection Time: 11/27/20  7:50 PM   Specimen: Nasal Mucosa; Nasal Swab  Result Value Ref Range Status   MRSA by PCR Next Gen NOT DETECTED NOT DETECTED Final    Comment: (NOTE) The GeneXpert MRSA Assay (FDA approved for NASAL specimens only), is one component of a comprehensive MRSA colonization surveillance program. It is not intended to diagnose MRSA infection nor to guide or monitor treatment for MRSA infections. Test performance is not FDA approved in patients less than 45 years old. Performed at [facility], [place] [address] [place], [place] [address]       Imaging Studies   DG Chest 1 View  Result Date: 11/29/2020 CLINICAL DATA:  Increased shortness of breath and wheezing. EXAM: CHEST  1 VIEW COMPARISON:  November 27, 2020 FINDINGS: Mild infiltrates are seen within the bilateral lung bases and upper right lung. This is mildly increased in severity when compared to the prior study. There is no evidence of a pleural effusion or pneumothorax. The heart size and mediastinal contours are within normal limits. There is mild calcification of the aortic arch. Prior vertebroplasty is noted within the mid and lower thoracic spine. A chronic ninth left rib fracture is seen. IMPRESSION: Mild right upper lobe and bibasilar infiltrates, mildly increased in severity when compared to the prior study Electronically Signed   By: Virgina Norfolk M.D.   On: 11/29/2020 03:32   ECHOCARDIOGRAM COMPLETE  Result Date: 11/29/2020    ECHOCARDIOGRAM REPORT   Patient Name:   Katrina Burnett Date of Exam: 11/29/2020 Medical Rec #:  [id number]     Height:       60.0 in Accession #:    [id number]    Weight:       92.4 lb Date of Birth:  10-Feb-1925      BSA:          1.344 m Patient Age:    27 years      BP:           138/56 mmHg Patient Gender: F             HR:           84 bpm. Exam Location:  Inpatient Procedure: 2D Echo, Color Doppler and Cardiac Doppler Indications:    CHF-Acute Diastolic N86.76  History:        Patient has prior history of Echocardiogram examinations, most                 recent 05/31/2012. PAD and Stroke; Risk Factors:Diabetes.  Sonographer:    Bernadene Person RDCS Referring Phys: [place]  1. Left ventricular ejection fraction, by estimation, is 60 to 65%. The left ventricle has normal function. The left ventricle has no regional wall motion abnormalities. Left ventricular diastolic parameters are consistent with Grade I diastolic dysfunction (impaired relaxation).  2. Right  ventricular systolic function is normal. The right ventricular size is normal. Tricuspid regurgitation signal is inadequate for assessing PA pressure.  3. The mitral valve is normal in structure. Trivial mitral valve regurgitation. No evidence of mitral stenosis.  4. The aortic valve is tricuspid. Aortic valve regurgitation is mild. Mild aortic valve sclerosis is present, with no evidence of aortic valve stenosis. Aortic regurgitation PHT measures 492 msec.  5. The inferior vena cava is normal in size with <50% respiratory variability, suggesting right atrial pressure of 8 mmHg. FINDINGS  Left Ventricle: Left ventricular ejection fraction, by estimation, is 60 to 65%. The left ventricle has normal function. The left ventricle has no regional wall motion abnormalities. The left ventricular internal cavity size was normal in size. There is  no left ventricular hypertrophy. Left ventricular diastolic parameters are consistent with Grade I diastolic dysfunction (impaired relaxation). Normal left ventricular filling pressure. Right Ventricle: The right ventricular size is normal. No increase in right ventricular wall thickness. Right ventricular systolic function is normal. Tricuspid regurgitation signal is inadequate for assessing PA pressure. Left Atrium: Left atrial size was normal in size. Right Atrium: Right atrial size was normal in size. Pericardium: There is no evidence of pericardial effusion. Mitral Valve: The mitral valve is normal in structure. Mild mitral annular calcification. Trivial mitral valve regurgitation. No evidence of mitral valve stenosis. Tricuspid Valve: The tricuspid valve is normal in structure. Tricuspid valve regurgitation is not demonstrated. No evidence of tricuspid stenosis. Aortic Valve: The aortic valve is tricuspid. Aortic valve regurgitation is mild. Aortic regurgitation PHT measures 492 msec. Mild aortic valve sclerosis is present, with no evidence of aortic valve stenosis. Pulmonic  Valve: The pulmonic valve was normal in structure. Pulmonic valve regurgitation is not visualized. No evidence of pulmonic stenosis. Aorta: The aortic root is normal in size and structure. Venous: The inferior vena cava is normal in size with less than 50% respiratory variability, suggesting right atrial pressure of 8 mmHg. IAS/Shunts: No atrial level shunt detected by color flow Doppler.  LEFT VENTRICLE PLAX 2D LVIDd:         4.20 cm  Diastology LVIDs:         2.10 cm  LV e' medial:    4.50 cm/s LV PW:         0.90 cm  LV E/e' medial:  15.2 LV IVS:        0.90 cm  LV e' lateral:   8.15 cm/s LVOT diam:     1.80 cm  LV E/e' lateral: 8.4 LV SV:         66 LV SV Index:   49 LVOT Area:     2.54 cm  RIGHT VENTRICLE RV S prime:     15.80 cm/s TAPSE (M-mode): 1.7 cm LEFT ATRIUM           Index       RIGHT ATRIUM           Index LA diam:      2.20 cm 1.64 cm/m  RA Area:     15.40 cm LA Vol (A2C): 29.8 ml 22.17 ml/m RA Volume:   38.40 ml  28.57 ml/m LA Vol (A4C): 23.4 ml 17.41 ml/m  AORTIC VALVE LVOT Vmax:   129.00 cm/s LVOT Vmean:  88.800 cm/s LVOT VTI:    0.261 m AI PHT:      492 msec  AORTA Ao Root diam: 3.60 cm Ao Asc diam:  3.00 cm MITRAL VALVE MV Area (PHT): 2.60 cm    SHUNTS MV Decel Time: 292 msec    Systemic VTI:  0.26 m MV E velocity: 68.40 cm/s  Systemic Diam: 1.80 cm MV A velocity: 82.00 cm/s MV E/A ratio:  0.83 Traci Turner MD Electronically signed by Fransico Him MD Signature Date/Time: 11/29/2020/11:16:57 AM    Final      Medications   Scheduled Meds:  acidophilus  1 capsule Oral QHS   albuterol  2.5 mg Nebulization Q4H WA   cefdinir  300 mg Oral Q12H   dorzolamide  1 drop Both Eyes BID   feeding supplement  237 mL Oral BID BM   ferrous sulfate  325 mg Oral BID WC   gabapentin  300 mg Oral QHS   guaiFENesin  600 mg Oral BID   hydrALAZINE  5 mg Intravenous Once   latanoprost  1 drop Both Eyes QHS   LORazepam  1 mg Oral TID   metroNIDAZOLE  500 mg Oral Q8H   multivitamin with minerals  1  tablet Oral QHS   pantoprazole  40 mg Oral Daily   potassium chloride  40 mEq Oral Q4H   traZODone  25 mg Oral QHS   Continuous Infusions:       LOS: 3 days    Time spent: 30 minutes with> 50% spent at bedside and in coordination of care.    Ezekiel Slocumb, DO [facility]  11/30/2020, 4:35 PM      If 7PM-7AM, please contact night-coverage. How to contact the [facility] Attending or Consulting provider [place] or covering provider during after hours [place], for this patient?    Check the care team in [facility] and look for a) attending/consulting [facility] provider listed and b) the [facility] team listed Log into [email or web address] and use 's universal password to access. If you do not have the password, please contact the hospital operator. Locate the [facility] provider you are looking for under [facility] and page to a number that you can be directly reached. If you still have difficulty reaching the provider, please page the [facility] (Director on Call) for the Hospitalists listed on amion for assistance.

## 2020-12-01 DIAGNOSIS — A419 Sepsis, unspecified organism: Principal | ICD-10-CM

## 2020-12-01 DIAGNOSIS — F418 Other specified anxiety disorders: Secondary | ICD-10-CM

## 2020-12-01 DIAGNOSIS — J189 Pneumonia, unspecified organism: Secondary | ICD-10-CM

## 2020-12-01 DIAGNOSIS — R636 Underweight: Secondary | ICD-10-CM

## 2020-12-01 DIAGNOSIS — I5031 Acute diastolic (congestive) heart failure: Secondary | ICD-10-CM

## 2020-12-01 DIAGNOSIS — E43 Unspecified severe protein-calorie malnutrition: Secondary | ICD-10-CM

## 2020-12-01 DIAGNOSIS — J9601 Acute respiratory failure with hypoxia: Secondary | ICD-10-CM

## 2020-12-01 LAB — BASIC METABOLIC PANEL
Anion gap: 11 (ref 5–15)
BUN: 21 mg/dL (ref 8–23)
CO2: 26 mmol/L (ref 22–32)
Calcium: 9.6 mg/dL (ref 8.9–10.3)
Chloride: 105 mmol/L (ref 98–111)
Creatinine, Ser: 0.65 mg/dL (ref 0.44–1.00)
GFR, Estimated: >60 mL/min (ref >60)
Glucose, Bld: 106 mg/dL — ABNORMAL HIGH (ref 70–99)
Potassium: 3.2 mmol/L — ABNORMAL LOW (ref 3.5–5.1)
Sodium: 142 mmol/L (ref 135–145)

## 2020-12-01 LAB — URINE CULTURE: Culture: >=100000 — AB

## 2020-12-01 LAB — MAGNESIUM: Magnesium: 2 mg/dL (ref 1.7–2.4)

## 2020-12-01 MED ORDER — FUROSEMIDE 20 MG PO TABS
20.0000 mg | ORAL_TABLET | Freq: Every day | ORAL | Status: DC
  Administered 2020-12-01 – 2020-12-02 (×2): 20 mg via ORAL
  Filled 2020-12-01 (×2): qty 1

## 2020-12-01 MED ORDER — KETOROLAC TROMETHAMINE 30 MG/ML IJ SOLN
15.0000 mg | Freq: Once | INTRAMUSCULAR | Status: AC
  Administered 2020-12-01: 15 mg via INTRAVENOUS
  Filled 2020-12-01: qty 1

## 2020-12-01 MED ORDER — MORPHINE SULFATE (PF) 2 MG/ML IV SOLN
1.0000 mg | INTRAVENOUS | Status: DC | PRN
Start: 2020-12-01 — End: 2020-12-03

## 2020-12-01 MED ORDER — HYDRALAZINE HCL 20 MG/ML IJ SOLN
5.0000 mg | Freq: Once | INTRAMUSCULAR | Status: AC
  Administered 2020-12-02: 5 mg via INTRAVENOUS
  Filled 2020-12-01: qty 1

## 2020-12-01 MED ORDER — ALBUTEROL SULFATE (2.5 MG/3ML) 0.083% IN NEBU
2.5000 mg | INHALATION_SOLUTION | Freq: Two times a day (BID) | RESPIRATORY_TRACT | Status: DC
  Filled 2020-12-01 (×2): qty 3

## 2020-12-01 MED ORDER — POTASSIUM CHLORIDE 20 MEQ PO PACK
40.0000 meq | PACK | ORAL | Status: AC
  Administered 2020-12-01 (×2): 40 meq via ORAL
  Filled 2020-12-01 (×2): qty 2

## 2020-12-01 NOTE — Progress Notes (Signed)
Started to give pt evening meds, pt refuses to have meds crushed in applesauce because it "makes me throw up". Gave first med with water, pt swallowed without difficulty but then a minute later vomited handful of green liquid. MD notified and zofran given.  Nausea relieved by zofran but pt refusing to take any more PO meds other than ativan "so I can sleep". MD notfied of refusal to take flagyl and cefdinir.   Pt later hypertensive, IV hydralazine given with good effect but later SBP back to 160s and pt with headache. Gave tylenol, MD notified of bp and HA - toradol ordered but when RN went to give, pt states headache is gone.   Later headache returned to toradol given but headache continues. Pt still refusing PO meds. MD notified via page.

## 2020-12-01 NOTE — Care Management Important Message (Signed)
Important Message  Patient Details IM Letter placed in Patient's room. Name: Katrina Burnett MRN: [id number] Date of Birth: [birth year]   Medicare Important Message Given:  Yes     Kerin Salen 12/01/2020, 10:46 AM

## 2020-12-01 NOTE — Progress Notes (Signed)
  Speech Language Pathology Treatment: Dysphagia  Patient Details Name: Katrina Burnett MRN: [id number] DOB: 09-15-1924 Today's Date: 12/01/2020 Time: 1125-1140 SLP Time Calculation (min) (ACUTE ONLY): 15 min  Assessment / Plan / Recommendation Clinical Impression  Patient seen to address dysphagia goals and observe with PO intake. She was awake and alert, semi reclined in recliner chair. SLP positioned patient to be upright in chair prior to PO intake. She consumed half cup of gelatin and a couple sips of thin liquids. Mild oral delay with gelatin but no oral residuals and no overt s/s aspiration or penetration. Swallow initiatio appeared timely. Patient appears to be tolerating recommended PO's at this time. SLP plans to f/u at least one more time to ensure continued toleration of PO's.   HPI HPI: Katrina Burnett is a 87 y.o. female with medical history significant of GERD, anxiety, arthritis, barrett's esophagus ,esophagitis, polymyalgia, pelvic fx.  Presenting with weakness and hypoxia.  MBS completed on 09/10/20 with reported functional oropharyngeal swallow mechanism and recommendations for regular solids and thin liquids.      SLP Plan  Continue with current plan of care       Recommendations  Diet recommendations: Dysphagia 3 (mechanical soft);Thin liquid Liquids provided via: Cup;Straw Medication Administration: Crushed with puree Supervision: Full supervision/cueing for compensatory strategies;Staff to assist with self feeding Compensations: Slow rate;Small sips/bites;Minimize environmental distractions Postural Changes and/or Swallow Maneuvers: Seated upright 90 degrees                Oral Care Recommendations: Oral care BID;Staff/trained caregiver to provide oral care Follow up Recommendations: 24 hour supervision/assistance SLP Visit Diagnosis: Dysphagia, unspecified (R13.10) Plan: Continue with current plan of care       Sonia Baller, MA, CCC-SLP 12/01/20 5:10  PM

## 2020-12-01 NOTE — Progress Notes (Signed)
PROGRESS NOTE    FRANTASIA COULIBALY  [id number] DOB: 05/06/1925 DOA: 11/27/2020 PCP: [name], DO    Brief Narrative:  Katrina Burnett is a 87 year old female with past medical history significant for GERD, anxiety, mostly wheelchair-bound who is brought in by family to [facility] ED on 7/21 for weakness, shortness of breath and not feeling herself for several days.  Patient also was noted to be more lethargic.  In the ED, patient was found to be hypoxic and septic secondary to pneumonia.  Patient was started on IV fluids, supplemental oxygen and empiric antibiotics.  TRH consulted for further evaluation and management.   Assessment & Plan:   Principal Problem:   Sepsis ([place]) Active Problems:   Depression with anxiety   Acute respiratory failure with hypoxia (HCC)   Multifocal pneumonia   Protein-calorie malnutrition, severe   Acute CHF (congestive heart failure) (HCC)   Underweight   Acute respiratory failure with hypoxia, POA Severe sepsis, POA Multifocal pneumonia Presenting to [facility] ED with shortness of breath and found to be hypoxic.  BC count elevated 13.4.  Lactic acid elevated 2.3 with procalcitonin 4.09.  COVID-19 PCR negative.  Influenza A/B PCR negative.  Strep pneumo and Legionella urinary antigen negative.  Chest x-ray with multifocal lung opacities, high suspicion for aspiration. --Cefdinir 300 mg p.o. BID -- Continue supplemental oxygen, maintain SPO2 greater than 92%; on 4 L nasal cannula with SPO2 95% --Ambulatory O2 screening today  Acute renal failure Creatinine 1.24 on admission, etiology likely secondary to sepsis as above.  Received aggressive IV fluid hydration on admission with resolution of renal failure. --Cr 1.24>>0.65 --Avoid nephrotoxins, renally dose all medications  Acute on chronic diastolic congestive heart failure TTE with LVEF 60 to [id number], grade 1 diastolic dysfunction.  Elevated BNP on admission. --Furosemide 20 mg PO daily --Monitor renal function  electrolytes closely --Strict I's and O's and daily weights  Hypokalemia Potassium 3.2 and magnesium 2.0 this morning, will replete potassium. --Repeat BMP with magnesium in the a.m.  Glaucoma --Continue home eyedrops  Anxiety: --Ativan 1 mg p.o. 3 times daily  GERD: Continue PPI  Iron deficiency anemia Hemoglobin stable. --Ferrous sulfate 325 mg p.o. twice daily  Dysphagia Speech therapy following. --Continue dysphagia 3 mechanical soft diet with thin liquids, medications crushed with pure, aspiration precautions.  Severe protein calorie malnutrition Nutrition Status: Nutrition Problem: Severe Malnutrition Etiology: chronic illness Signs/Symptoms: severe fat depletion, severe muscle depletion Interventions: Ensure Enlive (each supplement provides 350kcal and 20 grams of protein), MVI, Magic cup  Insomnia: Trazodone 50 mg p.o. nightly  Weakness/deconditioning/debility: Patient from assisted living. --OT recommends home health --Pending PT evaluation   DVT prophylaxis: SCDs Start: 11/27/20 1950   Code Status: DNR Family Communication: No family present at bedside this morning  Disposition Plan:  Level of care: Med-Surg Status is: Inpatient  Remains inpatient appropriate because:Unsafe d/c plan, IV treatments appropriate due to intensity of illness or inability to take PO, and Inpatient level of care appropriate due to severity of illness  Dispo: The patient is from: ALF              Anticipated d/c is to: ALF              Patient currently is not medically stable to d/c.   Difficult to place patient No  Consultants:  None  Procedures:  None  Antimicrobials:  Cefdinir 7/24 Ceftriaxone 7/22 - 7/24 Metronidazole 7/24 - 7/25 Vancomycin 7/21 - 7/22 Cefepime 7/21 - 7/22  Subjective: Patient seen examined bedside, resting comfortably.  No specific complaints this morning.  Continues on supplemental oxygen.  Denies headache, no chest pain, no palpitations,  no abdominal pain, no fever/chills/night sweats, no nausea cefonicid diarrhea, no weakness, no paresthesias.  No acute concerns overnight per nursing staff.  Objective: Vitals:   12/01/20 0122 12/01/20 0226 12/01/20 0433 12/01/20 1310  BP: (!) 130/104 (!) 164/75 (!) 164/55 (!) 143/68  Pulse: 73 68 60 (!) 56  Resp:   14 20  Temp:  98 F (36.7 C) 97.6 F (36.4 C) 98 F (36.7 C)  TempSrc:  Oral Oral Oral  SpO2:  96% 95% 97%  Weight:   45.4 kg   Height:        Intake/Output Summary (Last 24 hours) at 12/01/2020 1730 Last data filed at 12/01/2020 1223 Gross per 24 hour  Intake 480 ml  Output 250 ml  Net 230 ml   Filed Weights   11/27/20 1934 11/30/20 0510 12/01/20 0433  Weight: 41.9 kg 45.3 kg 45.4 kg    Examination:  General exam: Appears calm and comfortable  Respiratory system: Decreased breath sounds bilateral bases with mild crackles, no wheezes,. Respiratory effort normal.  On 4 L nasal cannula with SPO2 95% Cardiovascular system: S1 & S2 heard, RRR. No JVD, murmurs, rubs, gallops or clicks. No pedal edema. Gastrointestinal system: Abdomen is nondistended, soft and nontender. No organomegaly or masses felt. Normal bowel sounds heard. Central nervous system: Alert and oriented. No focal neurological deficits. Extremities: Symmetric 5 x 5 power. Skin: No rashes, lesions or ulcers Psychiatry: Judgement and insight appear poor.  Mood & affect appropriate.     Data Reviewed: I have personally reviewed following labs and imaging studies  CBC: Recent Labs  Lab 11/27/20 1409 11/28/20 0515 11/29/20 0530  WBC 13.4* 9.8 9.8  NEUTROABS 11.5*  --   --   HGB 13.0 10.3* 11.7*  HCT 41.0 33.5* 36.7  MCV 95.3 96.5 93.9  PLT 320 252 [id number]   Basic Metabolic Panel: Recent Labs  Lab 11/27/20 1409 11/27/20 2018 11/28/20 0515 11/29/20 0530 11/30/20 0623 11/30/20 1844 12/01/20 0536  NA 140  --  141 142 142  --  142  K 4.5  --  3.8 2.9* 2.6* 3.1* 3.2*  CL 105  --  110 106 104   --  105  CO2 25  --  '22 26 26  '$ --  26  GLUCOSE 130*  --  107* 115* 96  --  106*  BUN 24*  --  20 28* 23  --  21  CREATININE 1.24*  --  0.81 0.89 0.74  --  0.65  CALCIUM 9.1  --  8.4* 9.3 9.5  --  9.6  MG  --  2.0  --   --  1.9  --  2.0   GFR: Estimated Creatinine Clearance: 30.8 mL/min (by C-G formula based on SCr of 0.65 mg/dL). Liver Function Tests: Recent Labs  Lab 11/27/20 1409 11/28/20 0515 11/29/20 0530  AST 36 23 32  ALT '14 13 18  '$ ALKPHOS 72 48 75  BILITOT 0.8 0.4 0.4  PROT 6.6 5.2* 6.6  ALBUMIN 3.2* 2.5* 3.3*   No results for input(s): LIPASE, AMYLASE in the last 168 hours. No results for input(s): AMMONIA in the last 168 hours. Coagulation Profile: Recent Labs  Lab 11/27/20 1409  INR 1.0   Cardiac Enzymes: No results for input(s): CKTOTAL, CKMB, CKMBINDEX, TROPONINI in the last 168 hours. BNP (last 3 results)  No results for input(s): PROBNP in the last 8760 hours. HbA1C: No results for input(s): HGBA1C in the last 72 hours. CBG: No results for input(s): GLUCAP in the last 168 hours. Lipid Profile: No results for input(s): CHOL, HDL, LDLCALC, TRIG, CHOLHDL, LDLDIRECT in the last 72 hours. Thyroid Function Tests: No results for input(s): TSH, T4TOTAL, FREET4, T3FREE, THYROIDAB in the last 72 hours. Anemia Panel: No results for input(s): VITAMINB12, FOLATE, FERRITIN, TIBC, IRON, RETICCTPCT in the last 72 hours. Sepsis Labs: Recent Labs  Lab 11/27/20 2018 11/28/20 1400 11/28/20 2055 11/29/20 0530  PROCALCITON  --  4.09  --  3.45  LATICACIDVEN 2.8* 2.3* 1.6 1.3    Recent Results (from the past 240 hour(s))  Culture, blood (routine x 2)     Status: None (Preliminary result)   Collection Time: 11/27/20  2:09 PM   Specimen: BLOOD  Result Value Ref Range Status   Specimen Description   Final    BLOOD LEFT ARM Performed at [facility] [address], [place], [place] [address]    Special Requests   Final    BOTTLES DRAWN AEROBIC  AND ANAEROBIC Blood Culture adequate volume Performed at [facility] [address], [place], [place] [address]    Culture   Final    NO GROWTH 4 DAYS Performed at [facility], [place] [address], [place], [place] [address]    Report Status PENDING  Incomplete  Culture, blood (routine x 2)     Status: None (Preliminary result)   Collection Time: 11/27/20  2:14 PM   Specimen: BLOOD  Result Value Ref Range Status   Specimen Description   Final    BLOOD LEFT ARM Performed at [facility] [address], [place], [place] [address]    Special Requests   Final    BOTTLES DRAWN AEROBIC AND ANAEROBIC Blood Culture adequate volume Performed at [facility] [address], [place], [place] [address]    Culture   Final    NO GROWTH 4 DAYS Performed at [facility], [place] [address] [place], [place] [address]    Report Status PENDING  Incomplete  Resp Panel by RT-PCR (Flu A&B, Covid) Nasopharyngeal Swab     Status: None   Collection Time: 11/27/20  2:27 PM   Specimen: Nasopharyngeal Swab; Nasopharyngeal(NP) swabs in vial transport medium  Result Value Ref Range Status   SARS Coronavirus 2 by RT PCR NEGATIVE NEGATIVE Final    Comment: (NOTE) SARS-CoV-2 target nucleic acids are NOT DETECTED.  The SARS-CoV-2 RNA is generally detectable in upper respiratory specimens during the acute phase of infection. The lowest concentration of SARS-CoV-2 viral copies this assay can detect is 138 copies/mL. A negative result does not preclude SARS-Cov-2 infection and should not be used as the sole basis for treatment or other patient management decisions. A negative result may occur with  improper specimen collection/handling, submission of specimen other than nasopharyngeal swab, presence of viral mutation(s) within the areas targeted by this assay, and inadequate number of viral copies(<138 copies/mL). A negative result must be  combined with clinical observations, patient history, and epidemiological information. The expected result is Negative.  Fact Sheet for Patients:  EntrepreneurPulse.com.au  Fact Sheet for Healthcare Providers:  IncredibleEmployment.be  This test is no t yet approved or cleared by the [place] FDA and  has been authorized for detection and/or diagnosis of SARS-CoV-2 by FDA under an Emergency Use Authorization (EUA). This  EUA will remain  in effect (meaning this test can be used) for the duration of the COVID-19 declaration under Section 564(b)(1) of the Act, 21 U.S.C.section 360bbb-3(b)(1), unless the authorization is terminated  or revoked sooner.       Influenza A by PCR NEGATIVE NEGATIVE Final   Influenza B by PCR NEGATIVE NEGATIVE Final    Comment: (NOTE) The Xpert Xpress SARS-CoV-2/FLU/RSV plus assay is intended as an aid in the diagnosis of influenza from Nasopharyngeal swab specimens and should not be used as a sole basis for treatment. Nasal washings and aspirates are unacceptable for Xpert Xpress SARS-CoV-2/FLU/RSV testing.  Fact Sheet for Patients: EntrepreneurPulse.com.au  Fact Sheet for Healthcare Providers: IncredibleEmployment.be  This test is not yet approved or cleared by the [place] FDA and has been authorized for detection and/or diagnosis of SARS-CoV-2 by FDA under an Emergency Use Authorization (EUA). This EUA will remain in effect (meaning this test can be used) for the duration of the COVID-19 declaration under Section 564(b)(1) of the Act, 21 U.S.C. section 360bbb-3(b)(1), unless the authorization is terminated or revoked.  Performed at [facility], [place] [address] [place], [place] [address]   Urine Culture     Status: Abnormal   Collection Time: 11/27/20  6:48 PM   Specimen: In/Out Cath Urine  Result Value Ref Range Status   Specimen Description    Final    IN/OUT CATH URINE Performed at [facility] [address] [place], [place] [address]    Special Requests   Final    NONE Performed at [facility], [place] [address], [place], [place] [address]    Culture >=100,000 COLONIES/mL ENTEROCOCCUS FAECALIS (A)  Final   Report Status 12/01/2020 FINAL  Final   Organism ID, Bacteria ENTEROCOCCUS FAECALIS (A)  Final      Susceptibility   Enterococcus faecalis - MIC*    AMPICILLIN <=2 SENSITIVE Sensitive     NITROFURANTOIN <=16 SENSITIVE Sensitive     VANCOMYCIN 2 SENSITIVE Sensitive     * >=100,000 COLONIES/mL ENTEROCOCCUS FAECALIS  MRSA Next Gen by PCR, Nasal     Status: None   Collection Time: 11/27/20  7:50 PM   Specimen: Nasal Mucosa; Nasal Swab  Result Value Ref Range Status   MRSA by PCR Next Gen NOT DETECTED NOT DETECTED Final    Comment: (NOTE) The GeneXpert MRSA Assay (FDA approved for NASAL specimens only), is one component of a comprehensive MRSA colonization surveillance program. It is not intended to diagnose MRSA infection nor to guide or monitor treatment for MRSA infections. Test performance is not FDA approved in patients less than 56 years old. Performed at [facility], [place] [address] [place],  [address]          Radiology Studies: No results found.      Scheduled Meds:  acidophilus  1 capsule Oral QHS   albuterol  2.5 mg Nebulization BID   cefdinir  300 mg Oral Q12H   dorzolamide  1 drop Both Eyes BID   feeding supplement  237 mL Oral BID BM   ferrous sulfate  325 mg Oral BID WC   gabapentin  300 mg Oral QHS   guaiFENesin  600 mg Oral BID   latanoprost  1 drop Both Eyes QHS   LORazepam  1 mg Oral TID   multivitamin with minerals  1 tablet Oral QHS   ondansetron (ZOFRAN) IV  4 mg Intravenous Q8H   pantoprazole  40 mg Oral Daily   traZODone  25 mg Oral QHS   Continuous Infusions:   LOS: 4 days    Time spent: 39 minutes  spent on chart review, discussion with nursing staff, consultants, updating family and interview/physical exam; more than 50% of that time was spent in counseling and/or coordination of care.    Logyn Dedominicis J [place] DO [facility] Available via Epic secure chat 7am-7pm After these hours, please refer to coverage provider listed on [email or web address] 12/01/2020, 5:30 PM

## 2020-12-01 NOTE — Progress Notes (Signed)
MD, patient is not taking oral medications well. She was unable to take several of her night medications after trying various methods. Please convert any oral medications to IV medications please.Katrina Burnett

## 2020-12-01 NOTE — Evaluation (Signed)
Occupational Therapy Evaluation Patient Details Name: Katrina Burnett MRN: [id number] DOB: Mar 16, 1925 Today's Date: 12/01/2020    History of Present Illness patient is a 87 year old female who presented to the [facility] on 7/21 with decreased O2 saturation via EMS from ALF. patient was found to have Sepsis secondary to multifocal pneumonia causing acute respiratory failure with hypoxia. [id number], anxiety, protien malnutrition, chronic CHF   Clinical Impression   Patient is a 87 year old confused female who was admitted for above. Patient was living at ALF with patient unable to describe what aid assisted her with at ALF. Currently, patient required max A for supine to sit on edge of bed with max A with two attempts for sit to stand with strong posterior leaning with patient able to correct to min A to maintain upright standing balance with rolling walker. Patient was max A for clothing management and hygiene tasks. Patient required mod A to complete transfer from edge of bed to [facility] with rolling walker. Patient was noted to have decreased strength, decreased standing balance and tolerance, decreased functional endurance leading to increased need for caregiver assistance for ADLs. Patient would continue to benefit from skilled OT services at this time while admitted and after d/c to address noted deficits in order to improve overall safety and independence in ADLs. Patient to d/c back to ALF if caregivers can offer physical assistance.      Follow Up Recommendations  Home health OT;Supervision/Assistance - 24 hour    Equipment Recommendations  None recommended by OT    Recommendations for Other Services       Precautions / Restrictions Precautions Precautions: Fall Restrictions Weight Bearing Restrictions: No      Mobility Bed Mobility Overal bed mobility: Needs Assistance Bed Mobility: Supine to Sit;Sit to Supine     Supine to sit: Mod assist;HOB elevated Sit to supine: Mod  assist        Transfers Overall transfer level: Needs assistance Equipment used: Rolling walker (2 wheeled) Transfers: Stand Pivot Transfers;Sit to/from Stand Sit to Stand: Mod assist;From elevated surface Stand pivot transfers: Mod assist       General transfer comment: patient demonstrated strong posterior lean during standing with two attempts intial standing from bed wtih rolling walker. patient was able to decrease assist to min A with RW to move forwards.    Balance Overall balance assessment: Needs assistance Sitting-balance support: Bilateral upper extremity supported;Feet supported Sitting balance-Leahy Scale: Fair     Standing balance support: Bilateral upper extremity supported Standing balance-Leahy Scale: Zero                             ADL either performed or assessed with clinical judgement   ADL Overall ADL's : Needs assistance/impaired Eating/Feeding: Supervision/ safety;Sitting   Grooming: Wash/dry face;Wash/dry hands;Minimal assistance;Sitting   Upper Body Bathing: Moderate assistance;Sitting;Bed level   Lower Body Bathing: Total assistance;Bed level   Upper Body Dressing : Moderate assistance;Bed level   Lower Body Dressing: Maximal assistance;Bed level   Toilet Transfer: Moderate assistance;RW;BSC Toilet Transfer Details (indicate cue type and reason): patient was noted to have strong posterior leaning when attempting to transfer. patient was able to correct posterior leaning wiht taking a few steps forward and being attentive to task. Toileting- Clothing Manipulation and Hygiene: Maximal assistance;Sit to/from stand Toileting - Clothing Manipulation Details (indicate cue type and reason): posterior leaning with BUE support on RW.     Functional mobility during  ADLs: Moderate assistance;Rolling walker;Cueing for sequencing;Cueing for safety       Vision Patient Visual Report: No change from baseline       Perception     Praxis       Pertinent Vitals/Pain Pain Assessment: 0-10 Pain Score: 5  Pain Location: head Pain Descriptors / Indicators: Headache Pain Intervention(s): Limited activity within patient's tolerance;Monitored during session     Hand Dominance Right   Extremity/Trunk Assessment Upper Extremity Assessment Upper Extremity Assessment: Overall WFL for tasks assessed   Lower Extremity Assessment Lower Extremity Assessment: Defer to PT evaluation   Cervical / Trunk Assessment Cervical / Trunk Assessment: Kyphotic   Communication Communication Communication: No difficulties   Cognition Arousal/Alertness: Awake/alert Behavior During Therapy: Flat affect Overall Cognitive Status: No family/caregiver present to determine baseline cognitive functioning                                 General Comments: patient reported it was " 22nd of august" when asked the year patient was unable to give answer.  when patietn was oriented patient reported "yeah" patient was oriented to self and place.   General Comments       Exercises     Shoulder Instructions      Home Living Family/patient expects to be discharged to:: Skilled nursing facility                                 Additional Comments: Resident at [facility] ALF. patient reported living at home with "mother"      Prior Functioning/Environment          Comments: reports using a walker, having help bathing 3 days per week and being active with therapy at ALF as per 2 months ago history. patient is unable to provide accurate PLOF at this time.        OT Problem List: Decreased strength;Impaired balance (sitting and/or standing);Decreased range of motion;Decreased activity tolerance;Decreased coordination;Decreased knowledge of use of DME or AE      OT Treatment/Interventions: Self-care/ADL training;DME and/or AE instruction;Balance training;Therapeutic activities;Therapeutic exercise;Patient/family  education    OT Goals(Current goals can be found in the care plan section) Acute Rehab OT Goals Patient Stated Goal: to go home OT Goal Formulation: With patient Time For Goal Achievement: 12/15/20 Potential to Achieve Goals: Fair  OT Frequency:     Barriers to D/C:    patient lives at [facility] per hospital history. patient reported to therapist living at home with mother.       Co-evaluation              AM-PAC OT "6 Clicks" Daily Activity     Outcome Measure Help from another person eating meals?: A Little Help from another person taking care of personal grooming?: A Little Help from another person toileting, which includes using toliet, bedpan, or urinal?: A Lot Help from another person bathing (including washing, rinsing, drying)?: A Lot Help from another person to put on and taking off regular upper body clothing?: A Lot Help from another person to put on and taking off regular lower body clothing?: A Lot 6 Click Score: 14   End of Session Equipment Utilized During Treatment: Rolling walker;Gait belt;Oxygen Nurse Communication: Other (comment) (patients headache and pain level)  Activity Tolerance: Patient tolerated treatment well Patient left: in bed;with call bell/phone within reach;with chair alarm set  OT Visit Diagnosis: Muscle weakness (generalized) (M62.81);Unsteadiness on feet (R26.81)                Time: [id number] OT Time Calculation (min): 25 min Charges:  OT General Charges $OT Visit: 1 Visit OT Evaluation $OT Eval Low Complexity: 1 Low OT Treatments $Self Care/Home Management : 8-22 mins  Jackelyn Poling OTR/L, MS Acute Rehabilitation Department Office# [phone] Pager# [phone]   [place] 12/01/2020, 4:36 PM

## 2020-12-02 DIAGNOSIS — A419 Sepsis, unspecified organism: Secondary | ICD-10-CM | POA: Diagnosis not present

## 2020-12-02 DIAGNOSIS — J9601 Acute respiratory failure with hypoxia: Secondary | ICD-10-CM | POA: Diagnosis not present

## 2020-12-02 DIAGNOSIS — F418 Other specified anxiety disorders: Secondary | ICD-10-CM | POA: Diagnosis not present

## 2020-12-02 DIAGNOSIS — I5031 Acute diastolic (congestive) heart failure: Secondary | ICD-10-CM | POA: Diagnosis not present

## 2020-12-02 DIAGNOSIS — R1319 Other dysphagia: Secondary | ICD-10-CM

## 2020-12-02 DIAGNOSIS — R1310 Dysphagia, unspecified: Secondary | ICD-10-CM

## 2020-12-02 LAB — BASIC METABOLIC PANEL
Anion gap: 8 (ref 5–15)
BUN: 22 mg/dL (ref 8–23)
CO2: 25 mmol/L (ref 22–32)
Calcium: 9.5 mg/dL (ref 8.9–10.3)
Chloride: 105 mmol/L (ref 98–111)
Creatinine, Ser: 0.73 mg/dL (ref 0.44–1.00)
GFR, Estimated: >60 mL/min (ref >60)
Glucose, Bld: 96 mg/dL (ref 70–99)
Potassium: 3 mmol/L — ABNORMAL LOW (ref 3.5–5.1)
Sodium: 138 mmol/L (ref 135–145)

## 2020-12-02 LAB — CULTURE, BLOOD (ROUTINE X 2)
Culture: NO GROWTH
Culture: NO GROWTH
Special Requests: ADEQUATE
Special Requests: ADEQUATE

## 2020-12-02 LAB — MAGNESIUM: Magnesium: 1.8 mg/dL (ref 1.7–2.4)

## 2020-12-02 MED ORDER — POTASSIUM CHLORIDE 20 MEQ PO PACK
40.0000 meq | PACK | ORAL | Status: AC
  Administered 2020-12-02 (×2): 40 meq via ORAL
  Filled 2020-12-02 (×2): qty 2

## 2020-12-02 MED ORDER — FUROSEMIDE 20 MG PO TABS: 20.0000 mg | ORAL_TABLET | Freq: Every day | ORAL | Status: DC

## 2020-12-02 MED ORDER — CITALOPRAM HYDROBROMIDE 40 MG PO TABS
40.0000 mg | ORAL_TABLET | Freq: Every day | ORAL | Status: DC
Start: 2020-12-02 — End: 2021-06-01

## 2020-12-02 MED ORDER — CEFDINIR 300 MG PO CAPS: 300.0000 mg | ORAL_CAPSULE | Freq: Two times a day (BID) | ORAL | 0 refills | Status: AC

## 2020-12-02 MED ORDER — TRAZODONE HCL 50 MG PO TABS: 25.0000 mg | ORAL_TABLET | Freq: Every evening | ORAL | 0 refills | Status: AC | PRN

## 2020-12-02 MED ORDER — OXYCODONE-ACETAMINOPHEN 10-325 MG PO TABS: 1.0000 | ORAL_TABLET | Freq: Four times a day (QID) | ORAL | 0 refills | Status: DC | PRN

## 2020-12-02 MED ORDER — MAGNESIUM SULFATE 2 GM/50ML IV SOLN
2.0000 g | Freq: Once | INTRAVENOUS | Status: AC
  Administered 2020-12-02: 2 g via INTRAVENOUS
  Filled 2020-12-02: qty 50

## 2020-12-02 MED ORDER — GABAPENTIN 300 MG PO CAPS: 300.0000 mg | ORAL_CAPSULE | Freq: Every day | ORAL | 0 refills | Status: AC

## 2020-12-02 MED ORDER — LORAZEPAM 2 MG PO TABS: 1.0000 mg | ORAL_TABLET | Freq: Three times a day (TID) | ORAL | 0 refills | Status: DC

## 2020-12-02 NOTE — TOC Initial Note (Signed)
Transition of Care [facility]) - Initial/Assessment Note    Patient Details  Name: Katrina Burnett MRN: [id number] Date of Birth: 06-Jul-1924  Transition of Care [facility]) CM/SW Contact:    Lynnell Catalan, RN Phone Number: 12/02/2020, 3:23 PM  Clinical Narrative:                 Spoke with pt at bedside who defers dc planning to daughter [name]. [name] states that pt lives in [facility] and has 24hr caregivers that stay in her apartment with her. [name] states that they just recently started pt on hospice services but she is unable to tell me the company. [name] states also that pt was recently started on 02 and it has been delivered to her apartment. Per [name], pt's sitter will be at her apartment at 3pm for her dc today. It was confirmed with Lesly Rubenstein, the RN from [facility] that the 02 is in place and the DC summary was faxed to [phone]. Ambulance transport set up for pt and yellow DNR on the chart for dc. RN to call report to [phone].   Expected Discharge Plan: [place] Barriers to Discharge: No Barriers Identified   Patient Goals and CMS Choice Patient states their goals for this hospitalization and ongoing recovery are:: To go home      Expected Discharge Plan and Services Expected Discharge Plan: [place]   Discharge Planning Services: CM Consult   Living arrangements for the past 2 months: Apartment Expected Discharge Date: 12/02/20                                    Prior Living Arrangements/Services Living arrangements for the past 2 months: Apartment Lives with:: Self   Do you feel safe going back to the place where you live?: Yes      Need for Family Participation in Patient Care: Yes (Comment) Care giver support system in place?: Yes (comment) Current home services: [facility]    Activities of Daily Living Home Assistive Devices/Equipment: Environmental consultant (specify type) ADL Screening (condition at time of  admission) Patient's cognitive ability adequate to safely complete daily activities?: No Is the patient deaf or have difficulty hearing?: No Does the patient have difficulty seeing, even when wearing glasses/contacts?: No Does the patient have difficulty concentrating, remembering, or making decisions?: Yes Patient able to express need for assistance with ADLs?: Yes Does the patient have difficulty dressing or bathing?: No Independently performs ADLs?: No Communication: Independent Dressing (OT): Needs assistance Is this a change from baseline?: Pre-admission baseline Grooming: Needs assistance Is this a change from baseline?: Pre-admission baseline Feeding: Independent Bathing: Needs assistance Is this a change from baseline?: Pre-admission baseline Toileting: Needs assistance Is this a change from baseline?: Pre-admission baseline In/Out Bed: Needs assistance Is this a change from baseline?: Pre-admission baseline Walks in Home: Needs assistance Is this a change from baseline?: Pre-admission baseline Does the patient have difficulty walking or climbing stairs?: Yes Weakness of Legs: None Weakness of Arms/Hands: None  Permission Sought/Granted                  Emotional Assessment Appearance:: Appears stated age            Admission diagnosis:  Hypoxia [R09.02] Altered mental status, unspecified altered mental status type [R41.82] Multifocal pneumonia [J18.9] Patient Active Problem List   Diagnosis Date Noted   Dysphagia 12/02/2020  Protein-calorie malnutrition, severe 11/28/2020   Sepsis ([place]) 11/28/2020   Acute CHF (congestive heart failure) ([place]) 11/28/2020   Underweight 11/28/2020   Multifocal pneumonia 11/27/2020   CAP (community acquired pneumonia) 09/09/2020   Malnutrition ([place]) 09/09/2020   Chronic pain disorder 09/09/2020   Glaucoma 09/09/2020   DNR (do not resuscitate) 09/09/2020   Acute respiratory failure with hypoxia ([place]) 01/24/2020   Acute  hypoxemic respiratory failure ([place]) 01/23/2020   Depression with anxiety    Thrombocytosis    Esophageal mass 11/19/2019   Hiatal hernia    Esophagitis    Pelvic fracture ([place]) 11/11/2019   Acute on chronic anemia 11/25/2016   Hyponatremia 11/25/2016   Closed fracture of femur ([place])    Acute blood loss anemia    Adjustment disorder with mixed anxiety and depressed mood    Fall    Abnormality of gait    Femur fracture, right, closed, initial encounter 05/23/2015   Femoral neck fracture, right, closed, initial encounter 05/22/2015   Syncope 05/30/2012   Nausea & vomiting 05/30/2012   Chest pain 05/30/2012   Hypotension 05/30/2012   GERD (gastroesophageal reflux disease) 12/16/2011   Lumbar compression fracture ([place])    Polymyalgia ([place])    PCP:  [name], DO Pharmacy:   [facility] [address] [place] - [address] [place] [address] Phone: [phone] Fax: [phone]  [facility] [place], [place] - [address] [place] [address] Phone: [phone] Fax: [phone]     Social Determinants of Health ([place]) Interventions    Readmission Risk Interventions Readmission Risk Prevention Plan 12/02/2020  Transportation Screening Complete  PCP or Specialist Appt within 5-7 Days Complete  Home Care Screening Complete  Medication Review (RN CM) Complete  Some recent data might be hidden

## 2020-12-02 NOTE — Discharge Summary (Signed)
Physician Discharge Summary  Katrina Burnett [id number] DOB: November 18, 1924 DOA: 11/27/2020  PCP: [name], DO  Admit date: 11/27/2020 Discharge date: 12/02/2020  Admitted From: ALF  Disposition:  ALF  Recommendations for Outpatient Follow-up:  Follow up with PCP in 1-2 weeks Continue cefdinir 300 mg p.o. twice daily to complete 10-day course for pneumonia Please obtain BMP in one week to assess creatinine and potassium level  Home Health: PT/OT Equipment/Devices: Oxygen, 2 L per nasal cannula  Discharge Condition: Stable; but overall guarded given her comorbidities and advanced age CODE STATUS: DNR Diet recommendation: dysphagia 3 mechanical soft diet with thin liquids, medications crushed with pure, aspiration precautions  History of present illness:  Katrina Burnett is a 87 year old female with past medical history significant for GERD, anxiety, mostly wheelchair-bound who is brought in by family to [place] ED on 7/21 for weakness, shortness of breath and not feeling herself for several days.  Patient also was noted to be more lethargic.   In the ED, patient was found to be hypoxic and septic secondary to pneumonia.  Patient was started on IV fluids, supplemental oxygen and empiric antibiotics.  TRH consulted for further evaluation and management.  Hospital course:  Acute respiratory failure with hypoxia, POA Severe sepsis, POA Multifocal pneumonia Presenting to [facility] ED with shortness of breath and found to be hypoxic.  BC count elevated 13.4.  Lactic acid elevated 2.3 with procalcitonin 4.09.  COVID-19 PCR negative.  Influenza A/B PCR negative.  Strep pneumo and Legionella urinary antigen negative.  Chest x-ray with multifocal lung opacities, high suspicion for aspiration.  Patient initially was started on empiric antibiotics with vancomycin, metronidazole and cefepime which was de-escalated to cefdinir.  We will continue antibiotics with cefdinir 300 mg p.o. twice daily to complete  10-day antibiotic course.  Patient continued to desaturate on ambulation and will require 2 L nasal cannula.  Acute renal failure Creatinine 1.24 on admission, etiology likely secondary to sepsis as above.  Received aggressive IV fluid hydration on admission with resolution of renal failure.  Creatinine 0.73 at time of discharge.   Acute on chronic diastolic congestive heart failure TTE with LVEF 60 to 123456, grade 1 diastolic dysfunction.  Elevated BNP on admission.  Started on furosemide 20 mg p.o. daily.  Recommend repeat BMP 1 week.   Hypokalemia Repleted during hospitalization.  Recommend repeat BMP 1 week.   Glaucoma Continue home eyedrops   Anxiety/depression: Ativan reduced to 1 mg p.o. 3 times daily and Celexa reduced to 40 mg p.o. daily.   GERD: Continue PPI   Iron deficiency anemia Hemoglobin stable. Ferrous sulfate 325 mg p.o. twice daily   Dysphagia Speech therapy consulted and followed during the hospital course. Continue dysphagia 3 mechanical soft diet with thin liquids, medications crushed with pure, aspiration precautions.   Severe protein calorie malnutrition Nutrition Status: Nutrition Problem: Severe Malnutrition Etiology: chronic illness Signs/Symptoms: severe fat depletion, severe muscle depletion Interventions: Ensure Enlive (each supplement provides 350kcal and 20 grams of protein), MVI, Magic cup   Insomnia: Trazodone 50 mg p.o. nightly   Weakness/deconditioning/debility: Patient was evaluated by PT and OT during hospitalization with recommendation of home health.  Returning to assisted living facility with home health therapy.  Patient is followed by hospice at her facility.   Discharge Diagnoses:  Principal Problem:   Sepsis ([place]) Active Problems:   Depression with anxiety   Acute respiratory failure with hypoxia (HCC)   Multifocal pneumonia   Protein-calorie malnutrition, severe   Acute  CHF (congestive heart failure) ([place])   Underweight    Dysphagia    Discharge Instructions  Discharge Instructions     Call MD for:  difficulty breathing, headache or visual disturbances   Complete by: As directed    Call MD for:  extreme fatigue   Complete by: As directed    Call MD for:  persistant dizziness or light-headedness   Complete by: As directed    Call MD for:  persistant nausea and vomiting   Complete by: As directed    Call MD for:  severe uncontrolled pain   Complete by: As directed    Call MD for:  temperature >100.4   Complete by: As directed    Diet - low sodium heart healthy   Complete by: As directed    Increase activity slowly   Complete by: As directed       Allergies as of 12/02/2020       Reactions   Minocycline Nausea And Vomiting   Paxil [paroxetine Hcl] Nausea And Vomiting   Sulfa Antibiotics Nausea And Vomiting   Amoxicillin Other (See Comments)   Doesn't remember reaction other than sore mouth Tolerates cefepime   Bupropion Hcl Er (sr)    Other reaction(s): fatigue   Cyclobenzaprine    Other reaction(s): dry mouth   Mirtazapine Other (See Comments)   Doesn't remember  Other reaction(s): jittery   Vilazodone Hcl Other (See Comments)   Doesn't remember  Other reaction(s): dizziness   Avelox [moxifloxacin Hcl In Nacl] Other (See Comments)   Felt bad   Cefuroxime Diarrhea   Other reaction(s): severe diarrhea   Clarithromycin Nausea Only   Other reaction(s): stomach pain   Levaquin [levofloxacin In D5w] Diarrhea   Nystatin-triamcinolone Rash   Penicillins Rash   Prednisone Nausea Only   Other reaction(s): sick        Medication List     STOP taking these medications    azithromycin 250 MG tablet Commonly known as: Zithromax   cyclobenzaprine 10 MG tablet Commonly known as: FLEXERIL       TAKE these medications    Align 4 MG Caps Take 4 mg by mouth at bedtime.   cefdinir 300 MG capsule Commonly known as: OMNICEF Take 1 capsule (300 mg total) by mouth every 12 (twelve)  hours for 5 days.   citalopram 40 MG tablet Commonly known as: CELEXA Take 1 tablet (40 mg total) by mouth daily. What changed: how much to take   dorzolamide 2 % ophthalmic solution Commonly known as: TRUSOPT Place 1 drop into both eyes 2 (two) times daily.   ferrous sulfate 325 (65 FE) MG tablet Take 1 tablet (325 mg total) by mouth 2 (two) times daily with a meal.   furosemide 20 MG tablet Commonly known as: LASIX Take 1 tablet (20 mg total) by mouth daily. Start taking on: December 03, 2020   gabapentin 300 MG capsule Commonly known as: NEURONTIN Take 1 capsule (300 mg total) by mouth at bedtime. What changed: how much to take   latanoprost 0.005 % ophthalmic solution Commonly known as: XALATAN Place 1 drop into both eyes at bedtime.   linaclotide 145 MCG Caps capsule Commonly known as: LINZESS Take 145 mcg by mouth daily before breakfast. What changed: Another medication with the same name was removed. Continue taking this medication, and follow the directions you see here.   LORazepam 2 MG tablet Commonly known as: ATIVAN Take 0.5 tablets (1 mg total) by mouth 3 (three)  times daily. What changed: how much to take   multivitamin tablet Take 1 tablet by mouth at bedtime.   oxyCODONE-acetaminophen 10-325 MG tablet Commonly known as: PERCOCET Take 1 tablet by mouth every 6 (six) hours as needed for pain.   pantoprazole 40 MG tablet Commonly known as: Protonix Take 1 tablet (40 mg total) by mouth daily.   traZODone 50 MG tablet Commonly known as: DESYREL Take 0.5 tablets (25 mg total) by mouth at bedtime as needed for sleep. What changed: how much to take               Durable Medical Equipment  (From admission, onward)           Start     Ordered   12/02/20 1011  For home use only DME oxygen  Once       Question Answer Comment  Length of Need 12 Months   Mode or (Route) Nasal cannula   Liters per Minute 2   Frequency Continuous (stationary and  portable oxygen unit needed)   Oxygen conserving device Yes   Oxygen delivery system Gas      12/02/20 1010            Follow-up Information     [name], DO. Schedule an appointment as soon as possible for a visit in 1 week(s).   Specialty: Family Medicine Contact information: [address] [place] [address] [phone]                Allergies  Allergen Reactions   Minocycline Nausea And Vomiting   Paxil [Paroxetine Hcl] Nausea And Vomiting   Sulfa Antibiotics Nausea And Vomiting   Amoxicillin Other (See Comments)    Doesn't remember reaction other than sore mouth Tolerates cefepime   Bupropion Hcl Er (Sr)     Other reaction(s): fatigue   Cyclobenzaprine     Other reaction(s): dry mouth   Mirtazapine Other (See Comments)    Doesn't remember  Other reaction(s): jittery   Vilazodone Hcl Other (See Comments)    Doesn't remember  Other reaction(s): dizziness   Avelox [Moxifloxacin Hcl In Nacl] Other (See Comments)    Felt bad   Cefuroxime Diarrhea    Other reaction(s): severe diarrhea   Clarithromycin Nausea Only    Other reaction(s): stomach pain   Levaquin [Levofloxacin In D5w] Diarrhea   Nystatin-Triamcinolone Rash   Penicillins Rash   Prednisone Nausea Only    Other reaction(s): sick    Consultations: None   Procedures/Studies: DG Chest 1 View  Result Date: 11/29/2020 CLINICAL DATA:  Increased shortness of breath and wheezing. EXAM: CHEST  1 VIEW COMPARISON:  November 27, 2020 FINDINGS: Mild infiltrates are seen within the bilateral lung bases and upper right lung. This is mildly increased in severity when compared to the prior study. There is no evidence of a pleural effusion or pneumothorax. The heart size and mediastinal contours are within normal limits. There is mild calcification of the aortic arch. Prior vertebroplasty is noted within the mid and lower thoracic spine. A chronic ninth left rib fracture is seen.  IMPRESSION: Mild right upper lobe and bibasilar infiltrates, mildly increased in severity when compared to the prior study Electronically Signed   By: Virgina Norfolk M.D.   On: 11/29/2020 03:32   DG Chest Port 1 View  Result Date: 11/27/2020 CLINICAL DATA:  Dyspnea.  Fatigue and altered mental status. EXAM: PORTABLE CHEST 1 VIEW COMPARISON:  Radiograph 09/09/2020. chest CT 11/19/2019 FINDINGS:  Streaky right basilar opacities with some improvement from prior exam. There are new ill-defined opacities in the right mid lung. Streaky left basilar opacities. Patient is rotated. Grossly stable heart size allowing for rotation. Aortic tortuosity. No pulmonary edema, large pleural effusion or pneumothorax. Bones are diffusely under mineralized multiple thoracic and lumbar vertebral compression fractures. Remote right distal clavicle fracture with nonunion. IMPRESSION: Multifocal lung opacities. There is improvement in right basilar opacity from prior, however no opacities at the left lung base and right mid lung. Findings are suspicious for multifocal pneumonia, with aspiration considered. Electronically Signed   By: Keith Rake M.D.   On: 11/27/2020 15:14   ECHOCARDIOGRAM COMPLETE  Result Date: 11/29/2020    ECHOCARDIOGRAM REPORT   Patient Name:   Katrina Burnett Date of Exam: 11/29/2020 Medical Rec #:  [id number]     Height:       60.0 in Accession #:    [id number]    Weight:       92.4 lb Date of Birth:  January 21, 1925      BSA:          1.344 m Patient Age:    13 years      BP:           138/56 mmHg Patient Gender: F             HR:           84 bpm. Exam Location:  Inpatient Procedure: 2D Echo, Color Doppler and Cardiac Doppler Indications:    CHF-Acute Diastolic [id number]  History:        Patient has prior history of Echocardiogram examinations, most                 recent 05/31/2012. PAD and Stroke; Risk Factors:Diabetes.  Sonographer:    Bernadene Person RDCS Referring Phys: [place]  1.  Left ventricular ejection fraction, by estimation, is 60 to 65%. The left ventricle has normal function. The left ventricle has no regional wall motion abnormalities. Left ventricular diastolic parameters are consistent with Grade I diastolic dysfunction (impaired relaxation).  2. Right ventricular systolic function is normal. The right ventricular size is normal. Tricuspid regurgitation signal is inadequate for assessing PA pressure.  3. The mitral valve is normal in structure. Trivial mitral valve regurgitation. No evidence of mitral stenosis.  4. The aortic valve is tricuspid. Aortic valve regurgitation is mild. Mild aortic valve sclerosis is present, with no evidence of aortic valve stenosis. Aortic regurgitation PHT measures 492 msec.  5. The inferior vena cava is normal in size with <50% respiratory variability, suggesting right atrial pressure of 8 mmHg. FINDINGS  Left Ventricle: Left ventricular ejection fraction, by estimation, is 60 to 65%. The left ventricle has normal function. The left ventricle has no regional wall motion abnormalities. The left ventricular internal cavity size was normal in size. There is  no left ventricular hypertrophy. Left ventricular diastolic parameters are consistent with Grade I diastolic dysfunction (impaired relaxation). Normal left ventricular filling pressure. Right Ventricle: The right ventricular size is normal. No increase in right ventricular wall thickness. Right ventricular systolic function is normal. Tricuspid regurgitation signal is inadequate for assessing PA pressure. Left Atrium: Left atrial size was normal in size. Right Atrium: Right atrial size was normal in size. Pericardium: There is no evidence of pericardial effusion. Mitral Valve: The mitral valve is normal in structure. Mild mitral annular calcification. Trivial mitral valve regurgitation. No evidence of mitral valve stenosis. Tricuspid  Valve: The tricuspid valve is normal in structure. Tricuspid valve  regurgitation is not demonstrated. No evidence of tricuspid stenosis. Aortic Valve: The aortic valve is tricuspid. Aortic valve regurgitation is mild. Aortic regurgitation PHT measures 492 msec. Mild aortic valve sclerosis is present, with no evidence of aortic valve stenosis. Pulmonic Valve: The pulmonic valve was normal in structure. Pulmonic valve regurgitation is not visualized. No evidence of pulmonic stenosis. Aorta: The aortic root is normal in size and structure. Venous: The inferior vena cava is normal in size with less than 50% respiratory variability, suggesting right atrial pressure of 8 mmHg. IAS/Shunts: No atrial level shunt detected by color flow Doppler.  LEFT VENTRICLE PLAX 2D LVIDd:         4.20 cm  Diastology LVIDs:         2.10 cm  LV e' medial:    4.50 cm/s LV PW:         0.90 cm  LV E/e' medial:  15.2 LV IVS:        0.90 cm  LV e' lateral:   8.15 cm/s LVOT diam:     1.80 cm  LV E/e' lateral: 8.4 LV SV:         66 LV SV Index:   49 LVOT Area:     2.54 cm  RIGHT VENTRICLE RV S prime:     15.80 cm/s TAPSE (M-mode): 1.7 cm LEFT ATRIUM           Index       RIGHT ATRIUM           Index LA diam:      2.20 cm 1.64 cm/m  RA Area:     15.40 cm LA Vol (A2C): 29.8 ml 22.17 ml/m RA Volume:   38.40 ml  28.57 ml/m LA Vol (A4C): 23.4 ml 17.41 ml/m  AORTIC VALVE LVOT Vmax:   129.00 cm/s LVOT Vmean:  88.800 cm/s LVOT VTI:    0.261 m AI PHT:      492 msec  AORTA Ao Root diam: 3.60 cm Ao Asc diam:  3.00 cm MITRAL VALVE MV Area (PHT): 2.60 cm    SHUNTS MV Decel Time: 292 msec    Systemic VTI:  0.26 m MV E velocity: 68.40 cm/s  Systemic Diam: 1.80 cm MV A velocity: 82.00 cm/s MV E/A ratio:  0.83 Fransico Him MD Electronically signed by Fransico Him MD Signature Date/Time: 11/29/2020/11:16:57 AM    Final      Subjective: Patient seen examined bedside, resting comfortably.  No specific complaints other than mild headache this morning.  Stable for discharge back to her ALF with home health.  Social work  indicates already has home oxygen and is being followed by hospice at the facility.  No other questions or concerns at this time.  Denies visual changes, no chest pain, no palpitations, no shortness of breath, no abdominal pain.  No acute events overnight per nursing staff.  Discharge Exam: Vitals:   12/02/20 0927 12/02/20 0947  BP:    Pulse: (!) 113   Resp:    Temp:    SpO2: 91% (!) 88%   Vitals:   12/02/20 0922 12/02/20 0926 12/02/20 0927 12/02/20 0947  BP:      Pulse: 96 (!) 113 (!) 113   Resp:      Temp:      TempSrc:      SpO2: 95% 92% 91% (!) 88%  Weight:      Height:  General: Pt is alert, awake, not in acute distress, elderly in appearance, chronically ill in appearance Cardiovascular: RRR, S1/S2 +, no rubs, no gallops Respiratory: Breath sounds slightly decreased bilateral bases, no wheezing, no rhonchi, on 2 L nasal cannula Abdominal: Soft, NT, ND, bowel sounds + Extremities: no edema, no cyanosis    The results of significant diagnostics from this hospitalization (including imaging, microbiology, ancillary and laboratory) are listed below for reference.     Microbiology: Recent Results (from the past 240 hour(s))  Culture, blood (routine x 2)     Status: None   Collection Time: 11/27/20  2:09 PM   Specimen: BLOOD  Result Value Ref Range Status   Specimen Description   Final    BLOOD LEFT ARM Performed at [facility] [address], [place], [place] [address]    Special Requests   Final    BOTTLES DRAWN AEROBIC AND ANAEROBIC Blood Culture adequate volume Performed at [facility] [address] [place], [place] [address]    Culture   Final    NO GROWTH 5 DAYS Performed at [facility], [place] [address] [place], [place] [address]    Report Status 12/02/2020 FINAL  Final  Culture, blood (routine x 2)     Status: None   Collection Time: 11/27/20  2:14 PM   Specimen: BLOOD  Result Value Ref Range Status    Specimen Description   Final    BLOOD LEFT ARM Performed at [facility] [address] [place], [place] [address]    Special Requests   Final    BOTTLES DRAWN AEROBIC AND ANAEROBIC Blood Culture adequate volume Performed at [facility] [address], [place], [place] [address]    Culture   Final    NO GROWTH 5 DAYS Performed at [facility], [place] [address], [place],  [address]    Report Status 12/02/2020 FINAL  Final  Resp Panel by RT-PCR (Flu A&B, Covid) Nasopharyngeal Swab     Status: None   Collection Time: 11/27/20  2:27 PM   Specimen: Nasopharyngeal Swab; Nasopharyngeal(NP) swabs in vial transport medium  Result Value Ref Range Status   SARS Coronavirus 2 by RT PCR NEGATIVE NEGATIVE Final    Comment: (NOTE) SARS-CoV-2 target nucleic acids are NOT DETECTED.  The SARS-CoV-2 RNA is generally detectable in upper respiratory specimens during the acute phase of infection. The lowest concentration of SARS-CoV-2 viral copies this assay can detect is 138 copies/mL. A negative result does not preclude SARS-Cov-2 infection and should not be used as the sole basis for treatment or other patient management decisions. A negative result may occur with  improper specimen collection/handling, submission of specimen other than nasopharyngeal swab, presence of viral mutation(s) within the areas targeted by this assay, and inadequate number of viral copies(<138 copies/mL). A negative result must be combined with clinical observations, patient history, and epidemiological information. The expected result is Negative.  Fact Sheet for Patients:  EntrepreneurPulse.com.au  Fact Sheet for Healthcare Providers:  IncredibleEmployment.be  This test is no t yet approved or cleared by the [place] FDA and  has been authorized for detection and/or diagnosis of SARS-CoV-2 by FDA under an Emergency Use  Authorization (EUA). This EUA will remain  in effect (meaning this test can be used) for the duration of the COVID-19 declaration under Section 564(b)(1) of the Act, 21 U.S.C.section 360bbb-3(b)(1), unless the authorization is terminated  or revoked sooner.  Influenza A by PCR NEGATIVE NEGATIVE Final   Influenza B by PCR NEGATIVE NEGATIVE Final    Comment: (NOTE) The Xpert Xpress SARS-CoV-2/FLU/RSV plus assay is intended as an aid in the diagnosis of influenza from Nasopharyngeal swab specimens and should not be used as a sole basis for treatment. Nasal washings and aspirates are unacceptable for Xpert Xpress SARS-CoV-2/FLU/RSV testing.  Fact Sheet for Patients: EntrepreneurPulse.com.au  Fact Sheet for Healthcare Providers: IncredibleEmployment.be  This test is not yet approved or cleared by the [place] FDA and has been authorized for detection and/or diagnosis of SARS-CoV-2 by FDA under an Emergency Use Authorization (EUA). This EUA will remain in effect (meaning this test can be used) for the duration of the COVID-19 declaration under Section 564(b)(1) of the Act, 21 U.S.C. section 360bbb-3(b)(1), unless the authorization is terminated or revoked.  Performed at [facility], [place] [address], [place], [place] [address]   Urine Culture     Status: Abnormal   Collection Time: 11/27/20  6:48 PM   Specimen: In/Out Cath Urine  Result Value Ref Range Status   Specimen Description   Final    IN/OUT CATH URINE Performed at [facility] [address] [place], [place] [address]    Special Requests   Final    NONE Performed at [facility], [place] [address], [place], [place] [address]    Culture >=100,000 COLONIES/mL ENTEROCOCCUS FAECALIS (A)  Final   Report Status 12/01/2020 FINAL  Final   Organism ID, Bacteria ENTEROCOCCUS FAECALIS (A)  Final      Susceptibility    Enterococcus faecalis - MIC*    AMPICILLIN <=2 SENSITIVE Sensitive     NITROFURANTOIN <=16 SENSITIVE Sensitive     VANCOMYCIN 2 SENSITIVE Sensitive     * >=100,000 COLONIES/mL ENTEROCOCCUS FAECALIS  MRSA Next Gen by PCR, Nasal     Status: None   Collection Time: 11/27/20  7:50 PM   Specimen: Nasal Mucosa; Nasal Swab  Result Value Ref Range Status   MRSA by PCR Next Gen NOT DETECTED NOT DETECTED Final    Comment: (NOTE) The GeneXpert MRSA Assay (FDA approved for NASAL specimens only), is one component of a comprehensive MRSA colonization surveillance program. It is not intended to diagnose MRSA infection nor to guide or monitor treatment for MRSA infections. Test performance is not FDA approved in patients less than 71 years old. Performed at [facility], [place] [address] [place],  [address]      Labs: BNP (last 3 results) Recent Labs    09/09/20 0800 11/28/20 1638  BNP 222.2* [id number]   Basic Metabolic Panel: Recent Labs  Lab 11/27/20 2018 11/28/20 0515 11/29/20 0530 11/30/20 0623 11/30/20 1844 12/01/20 0536 12/02/20 0555  NA  --  141 142 142  --  142 138  K  --  3.8 2.9* 2.6* 3.1* 3.2* 3.0*  CL  --  110 106 104  --  105 105  CO2  --  '22 26 26  '$ --  26 25  GLUCOSE  --  107* 115* 96  --  106* 96  BUN  --  20 28* 23  --  21 22  CREATININE  --  0.81 0.89 0.74  --  0.65 0.73  CALCIUM  --  8.4* 9.3 9.5  --  9.6 9.5  MG 2.0  --   --  1.9  --  2.0 1.8   Liver Function Tests: Recent Labs  Lab 11/27/20  1409 11/28/20 0515 11/29/20 0530  AST 36 23 32  ALT '14 13 18  '$ ALKPHOS 72 48 75  BILITOT 0.8 0.4 0.4  PROT 6.6 5.2* 6.6  ALBUMIN 3.2* 2.5* 3.3*   No results for input(s): LIPASE, AMYLASE in the last 168 hours. No results for input(s): AMMONIA in the last 168 hours. CBC: Recent Labs  Lab 11/27/20 1409 11/28/20 0515 11/29/20 0530  WBC 13.4* 9.8 9.8  NEUTROABS 11.5*  --   --   HGB 13.0 10.3* 11.7*  HCT 41.0 33.5* 36.7  MCV 95.3 96.5  93.9  PLT 320 252 297   Cardiac Enzymes: No results for input(s): CKTOTAL, CKMB, CKMBINDEX, TROPONINI in the last 168 hours. BNP: Invalid input(s): POCBNP CBG: No results for input(s): GLUCAP in the last 168 hours. D-Dimer No results for input(s): DDIMER in the last 72 hours. Hgb A1c No results for input(s): HGBA1C in the last 72 hours. Lipid Profile No results for input(s): CHOL, HDL, LDLCALC, TRIG, CHOLHDL, LDLDIRECT in the last 72 hours. Thyroid function studies No results for input(s): TSH, T4TOTAL, T3FREE, THYROIDAB in the last 72 hours.  Invalid input(s): FREET3 Anemia work up No results for input(s): VITAMINB12, FOLATE, FERRITIN, TIBC, IRON, RETICCTPCT in the last 72 hours. Urinalysis    Component Value Date/Time   COLORURINE YELLOW 11/27/2020 1848   APPEARANCEUR HAZY (A) 11/27/2020 1848   LABSPEC 1.023 11/27/2020 1848   PHURINE 5.0 11/27/2020 1848   GLUCOSEU NEGATIVE 11/27/2020 1848   HGBUR NEGATIVE 11/27/2020 1848   BILIRUBINUR NEGATIVE 11/27/2020 1848   KETONESUR 5 (A) 11/27/2020 1848   PROTEINUR 30 (A) 11/27/2020 1848   UROBILINOGEN 0.2 05/24/2013 1634   NITRITE NEGATIVE 11/27/2020 1848   LEUKOCYTESUR NEGATIVE 11/27/2020 1848   Sepsis Labs Invalid input(s): PROCALCITONIN,  WBC,  [facility] Micro[facility] Recent Results (from the past 240 hour(s))  Culture, blood (routine x 2)     Status: None   Collection Time: 11/27/20  2:09 PM   Specimen: BLOOD  Result Value Ref Range Status   Specimen Description   Final    BLOOD LEFT ARM Performed at [facility], [place] [address], [place], [place] [address]    Special Requests   Final    BOTTLES DRAWN AEROBIC AND ANAEROBIC Blood Culture adequate volume Performed at [place] [address] [place], [place] [address]    Culture   Final    NO GROWTH 5 DAYS Performed at [facility], [place] [address], [place], [place] [address]    Report Status 12/02/2020 FINAL  Final   Culture, blood (routine x 2)     Status: None   Collection Time: 11/27/20  2:14 PM   Specimen: BLOOD  Result Value Ref Range Status   Specimen Description   Final    BLOOD LEFT ARM Performed at [place] [address], [place], [place] [address]    Special Requests   Final    BOTTLES DRAWN AEROBIC AND ANAEROBIC Blood Culture adequate volume Performed at [place] [address], [place], [place] [address]    Culture   Final    NO GROWTH 5 DAYS Performed at [facility], [place] [address] [place], [place] [address]    Report Status 12/02/2020 FINAL  Final  Resp Panel by RT-PCR (Flu A&B, Covid) Nasopharyngeal Swab     Status: None   Collection Time: 11/27/20  2:27 PM   Specimen: Nasopharyngeal Swab; Nasopharyngeal(NP) swabs in vial transport  medium  Result Value Ref Range Status   SARS Coronavirus 2 by RT PCR NEGATIVE NEGATIVE Final    Comment: (NOTE) SARS-CoV-2 target nucleic acids are NOT DETECTED.  The SARS-CoV-2 RNA is generally detectable in upper respiratory specimens during the acute phase of infection. The lowest concentration of SARS-CoV-2 viral copies this assay can detect is 138 copies/mL. A negative result does not preclude SARS-Cov-2 infection and should not be used as the sole basis for treatment or other patient management decisions. A negative result may occur with  improper specimen collection/handling, submission of specimen other than nasopharyngeal swab, presence of viral mutation(s) within the areas targeted by this assay, and inadequate number of viral copies(<138 copies/mL). A negative result must be combined with clinical observations, patient history, and epidemiological information. The expected result is Negative.  Fact Sheet for Patients:  EntrepreneurPulse.com.au  Fact Sheet for Healthcare Providers:  IncredibleEmployment.be  This test is no t yet approved or  cleared by the [place] FDA and  has been authorized for detection and/or diagnosis of SARS-CoV-2 by FDA under an Emergency Use Authorization (EUA). This EUA will remain  in effect (meaning this test can be used) for the duration of the COVID-19 declaration under Section 564(b)(1) of the Act, 21 U.S.C.section 360bbb-3(b)(1), unless the authorization is terminated  or revoked sooner.       Influenza A by PCR NEGATIVE NEGATIVE Final   Influenza B by PCR NEGATIVE NEGATIVE Final    Comment: (NOTE) The Xpert Xpress SARS-CoV-2/FLU/RSV plus assay is intended as an aid in the diagnosis of influenza from Nasopharyngeal swab specimens and should not be used as a sole basis for treatment. Nasal washings and aspirates are unacceptable for Xpert Xpress SARS-CoV-2/FLU/RSV testing.  Fact Sheet for Patients: EntrepreneurPulse.com.au  Fact Sheet for Healthcare Providers: IncredibleEmployment.be  This test is not yet approved or cleared by the [place] FDA and has been authorized for detection and/or diagnosis of SARS-CoV-2 by FDA under an Emergency Use Authorization (EUA). This EUA will remain in effect (meaning this test can be used) for the duration of the COVID-19 declaration under Section 564(b)(1) of the Act, 21 U.S.C. section 360bbb-3(b)(1), unless the authorization is terminated or revoked.  Performed at [facility], [place] [address] [place], [place] [address]   Urine Culture     Status: Abnormal   Collection Time: 11/27/20  6:48 PM   Specimen: In/Out Cath Urine  Result Value Ref Range Status   Specimen Description   Final    IN/OUT CATH URINE Performed at [place] [address] [place], [place] [address]    Special Requests   Final    NONE Performed at [facility], [place] [address], [place], [place] [address]    Culture >=100,000 COLONIES/mL ENTEROCOCCUS FAECALIS (A)   Final   Report Status 12/01/2020 FINAL  Final   Organism ID, Bacteria ENTEROCOCCUS FAECALIS (A)  Final      Susceptibility   Enterococcus faecalis - MIC*    AMPICILLIN <=2 SENSITIVE Sensitive     NITROFURANTOIN <=16 SENSITIVE Sensitive     VANCOMYCIN 2 SENSITIVE Sensitive     * >=100,000 COLONIES/mL ENTEROCOCCUS FAECALIS  MRSA Next Gen by PCR, Nasal     Status: None   Collection Time: 11/27/20  7:50 PM   Specimen: Nasal Mucosa; Nasal Swab  Result Value Ref Range Status   MRSA by PCR Next Gen NOT DETECTED NOT DETECTED Final    Comment: (NOTE) The GeneXpert MRSA  Assay (FDA approved for NASAL specimens only), is one component of a comprehensive MRSA colonization surveillance program. It is not intended to diagnose MRSA infection nor to guide or monitor treatment for MRSA infections. Test performance is not FDA approved in patients less than 60 years old. Performed at [facility], [place] [address], [place],  [address]      Time coordinating discharge: Over 30 minutes  SIGNED:   Cecillia Menees J [place] DO  [facility] 12/02/2020, 2:07 PM

## 2020-12-02 NOTE — Progress Notes (Signed)
SATURATION QUALIFICATIONS: (This note is used to comply with regulatory documentation for home oxygen)  Patient Saturations on Room Air at Rest = 91%  Patient Saturations on Room Air while Ambulating = 87%  Patient Saturations on 2Liters of oxygen while at Rest = 95%  Please briefly explain why patient needs home oxygen:patient will need home oxygen

## 2020-12-02 NOTE — Evaluation (Signed)
Physical Therapy Evaluation Patient Details Name: Katrina Burnett MRN: [id number] DOB: 1924-09-09 Today's Date: 12/02/2020   History of Present Illness  patient is a 87 year old female who presented to the [facility] on 7/21 with decreased O2 saturation via EMS from ALF. patient was found to have Sepsis secondary to multifocal pneumonia causing acute respiratory failure with hypoxia. [id number], anxiety, protien malnutrition, chronic CHF  Clinical Impression  On eval, pt required Min assist for mobility. She walked ~15 feet with a RW around the room. Pt reported fatigue from walking with nursing earlier this am so deferred hallway ambulation. Pt presents with general weakness, decreased activity tolerance, and impaired gait and balance. O2 sat reading was 90% on RA during session. Replaced [place] O2 at end of session. Will plan to follow and progress activity as tolerated.     Follow Up Recommendations Home health PT;Supervision/Assistance - 24 hour (at ALF as long as facility can provide current level of assist)    Equipment Recommendations  None recommended by PT    Recommendations for Other Services       Precautions / Restrictions Precautions Precautions: Fall Precaution Comments: monitor O2 Restrictions Weight Bearing Restrictions: No      Mobility  Bed Mobility               General bed mobility comments: oob in recliner    Transfers Overall transfer level: Needs assistance Equipment used: Rolling walker (2 wheeled) Transfers: Sit to/from Stand Sit to Stand: Min assist         General transfer comment: Increased time. Cues for safety, technique, hand placement. Assist to  rise, steady, control descent. Pt attempts to sit before safely positioned.  Ambulation/Gait Ambulation/Gait assistance: Min assist Gait Distance (Feet): 15 Feet Assistive device: Rolling walker (2 wheeled) Gait Pattern/deviations: Step-through pattern;Decreased stride length;Trunk flexed      General Gait Details: Assist to steady pt and maneuver RW (pt possibly uses a rollator at baseline). Cues provided. Increased time. O2 90% on RA, dyspnea 2/4. Pt fatigues easily  Stairs            Wheelchair Mobility    Modified Rankin (Stroke Patients Only)       Balance Overall balance assessment: Needs assistance         Standing balance support: Bilateral upper extremity supported Standing balance-Leahy Scale: Poor                               Pertinent Vitals/Pain Pain Assessment: Faces Faces Pain Scale: Hurts even more Pain Location: headache Pain Descriptors / Indicators: Headache Pain Intervention(s): Monitored during session;Repositioned    Home Living Family/patient expects to be discharged to:: Unsure     Type of Home: Assisted living Home Access: Level entry     Home Layout: One level Home Equipment: Walker - 4 wheels      Prior Function Level of Independence: Needs assistance   Gait / Transfers Assistance Needed: ambulatory with RW. Pt stated she doesn't walk to dining room (meals are brought to her)  ADL's / Homemaking Assistance Needed: assist for ADLs        Hand Dominance        Extremity/Trunk Assessment   Upper Extremity Assessment Upper Extremity Assessment: Defer to OT evaluation    Lower Extremity Assessment Lower Extremity Assessment: Generalized weakness    Cervical / Trunk Assessment Cervical / Trunk Assessment: Kyphotic  Communication   Communication: No  difficulties  Cognition Arousal/Alertness: Awake/alert Behavior During Therapy: Flat affect Overall Cognitive Status: No family/caregiver present to determine baseline cognitive functioning                                 General Comments: follows commands with time. provides some history      General Comments      Exercises     Assessment/Plan    PT Assessment Patient needs continued PT services  PT Problem List Decreased  strength;Decreased mobility;Decreased activity tolerance;Decreased balance;Decreased knowledge of use of DME       PT Treatment Interventions DME instruction;Gait training;Therapeutic exercise;Balance training;Functional mobility training;Therapeutic activities;Patient/family education    PT Goals (Current goals can be found in the Care Plan section)  Acute Rehab PT Goals Patient Stated Goal: to go home PT Goal Formulation: With patient Time For Goal Achievement: 12/16/20 Potential to Achieve Goals: Good    Frequency Min 3X/week   Barriers to discharge        Co-evaluation               AM-PAC PT "6 Clicks" Mobility  Outcome Measure Help needed turning from your back to your side while in a flat bed without using bedrails?: A Little Help needed moving from lying on your back to sitting on the side of a flat bed without using bedrails?: A Little Help needed moving to and from a bed to a chair (including a wheelchair)?: A Little Help needed standing up from a chair using your arms (e.g., wheelchair or bedside chair)?: A Little Help needed to walk in hospital room?: A Little Help needed climbing 3-5 steps with a railing? : A Lot 6 Click Score: 17    End of Session Equipment Utilized During Treatment: Gait belt;Oxygen Activity Tolerance: Patient limited by fatigue Patient left: in chair;with call bell/phone within reach;with chair alarm set   PT Visit Diagnosis: Muscle weakness (generalized) (M62.81);Difficulty in walking, not elsewhere classified (R26.2)    Time: [id number] PT Time Calculation (min) (ACUTE ONLY): 24 min   Charges:   PT Evaluation $PT Eval Moderate Complexity: 1 Mod PT Treatments $Gait Training: 8-22 mins           Doreatha Massed, PT Acute Rehabilitation  Office: [phone] Pager: [phone]

## 2020-12-03 DIAGNOSIS — J441 Chronic obstructive pulmonary disease with (acute) exacerbation: Secondary | ICD-10-CM | POA: Diagnosis not present

## 2020-12-03 DIAGNOSIS — F419 Anxiety disorder, unspecified: Secondary | ICD-10-CM | POA: Diagnosis not present

## 2020-12-03 DIAGNOSIS — M810 Age-related osteoporosis without current pathological fracture: Secondary | ICD-10-CM | POA: Diagnosis not present

## 2020-12-03 DIAGNOSIS — G894 Chronic pain syndrome: Secondary | ICD-10-CM | POA: Diagnosis not present

## 2020-12-03 DIAGNOSIS — F32A Depression, unspecified: Secondary | ICD-10-CM | POA: Diagnosis not present

## 2020-12-03 DIAGNOSIS — K219 Gastro-esophageal reflux disease without esophagitis: Secondary | ICD-10-CM | POA: Diagnosis not present

## 2020-12-05 DIAGNOSIS — J441 Chronic obstructive pulmonary disease with (acute) exacerbation: Secondary | ICD-10-CM | POA: Diagnosis not present

## 2020-12-05 DIAGNOSIS — G894 Chronic pain syndrome: Secondary | ICD-10-CM | POA: Diagnosis not present

## 2020-12-05 DIAGNOSIS — M810 Age-related osteoporosis without current pathological fracture: Secondary | ICD-10-CM | POA: Diagnosis not present

## 2020-12-05 DIAGNOSIS — K219 Gastro-esophageal reflux disease without esophagitis: Secondary | ICD-10-CM | POA: Diagnosis not present

## 2020-12-05 DIAGNOSIS — F419 Anxiety disorder, unspecified: Secondary | ICD-10-CM | POA: Diagnosis not present

## 2020-12-05 DIAGNOSIS — F32A Depression, unspecified: Secondary | ICD-10-CM | POA: Diagnosis not present

## 2020-12-08 DIAGNOSIS — K219 Gastro-esophageal reflux disease without esophagitis: Secondary | ICD-10-CM | POA: Diagnosis not present

## 2020-12-08 DIAGNOSIS — F419 Anxiety disorder, unspecified: Secondary | ICD-10-CM | POA: Diagnosis not present

## 2020-12-08 DIAGNOSIS — J441 Chronic obstructive pulmonary disease with (acute) exacerbation: Secondary | ICD-10-CM | POA: Diagnosis not present

## 2020-12-08 DIAGNOSIS — Z8701 Personal history of pneumonia (recurrent): Secondary | ICD-10-CM | POA: Diagnosis not present

## 2020-12-08 DIAGNOSIS — G894 Chronic pain syndrome: Secondary | ICD-10-CM | POA: Diagnosis not present

## 2020-12-08 DIAGNOSIS — Z515 Encounter for palliative care: Secondary | ICD-10-CM | POA: Diagnosis not present

## 2020-12-08 DIAGNOSIS — M810 Age-related osteoporosis without current pathological fracture: Secondary | ICD-10-CM | POA: Diagnosis not present

## 2020-12-08 DIAGNOSIS — F32A Depression, unspecified: Secondary | ICD-10-CM | POA: Diagnosis not present

## 2020-12-09 DIAGNOSIS — G894 Chronic pain syndrome: Secondary | ICD-10-CM | POA: Diagnosis not present

## 2020-12-09 DIAGNOSIS — K219 Gastro-esophageal reflux disease without esophagitis: Secondary | ICD-10-CM | POA: Diagnosis not present

## 2020-12-09 DIAGNOSIS — F32A Depression, unspecified: Secondary | ICD-10-CM | POA: Diagnosis not present

## 2020-12-09 DIAGNOSIS — M810 Age-related osteoporosis without current pathological fracture: Secondary | ICD-10-CM | POA: Diagnosis not present

## 2020-12-09 DIAGNOSIS — F419 Anxiety disorder, unspecified: Secondary | ICD-10-CM | POA: Diagnosis not present

## 2020-12-09 DIAGNOSIS — J441 Chronic obstructive pulmonary disease with (acute) exacerbation: Secondary | ICD-10-CM | POA: Diagnosis not present

## 2020-12-10 DIAGNOSIS — K219 Gastro-esophageal reflux disease without esophagitis: Secondary | ICD-10-CM | POA: Diagnosis not present

## 2020-12-10 DIAGNOSIS — J441 Chronic obstructive pulmonary disease with (acute) exacerbation: Secondary | ICD-10-CM | POA: Diagnosis not present

## 2020-12-10 DIAGNOSIS — F419 Anxiety disorder, unspecified: Secondary | ICD-10-CM | POA: Diagnosis not present

## 2020-12-10 DIAGNOSIS — M810 Age-related osteoporosis without current pathological fracture: Secondary | ICD-10-CM | POA: Diagnosis not present

## 2020-12-10 DIAGNOSIS — F32A Depression, unspecified: Secondary | ICD-10-CM | POA: Diagnosis not present

## 2020-12-10 DIAGNOSIS — G894 Chronic pain syndrome: Secondary | ICD-10-CM | POA: Diagnosis not present

## 2020-12-11 DIAGNOSIS — J441 Chronic obstructive pulmonary disease with (acute) exacerbation: Secondary | ICD-10-CM | POA: Diagnosis not present

## 2020-12-11 DIAGNOSIS — G894 Chronic pain syndrome: Secondary | ICD-10-CM | POA: Diagnosis not present

## 2020-12-11 DIAGNOSIS — M810 Age-related osteoporosis without current pathological fracture: Secondary | ICD-10-CM | POA: Diagnosis not present

## 2020-12-11 DIAGNOSIS — F419 Anxiety disorder, unspecified: Secondary | ICD-10-CM | POA: Diagnosis not present

## 2020-12-11 DIAGNOSIS — K219 Gastro-esophageal reflux disease without esophagitis: Secondary | ICD-10-CM | POA: Diagnosis not present

## 2020-12-11 DIAGNOSIS — F32A Depression, unspecified: Secondary | ICD-10-CM | POA: Diagnosis not present

## 2020-12-13 DIAGNOSIS — G894 Chronic pain syndrome: Secondary | ICD-10-CM | POA: Diagnosis not present

## 2020-12-13 DIAGNOSIS — F419 Anxiety disorder, unspecified: Secondary | ICD-10-CM | POA: Diagnosis not present

## 2020-12-13 DIAGNOSIS — K219 Gastro-esophageal reflux disease without esophagitis: Secondary | ICD-10-CM | POA: Diagnosis not present

## 2020-12-13 DIAGNOSIS — J441 Chronic obstructive pulmonary disease with (acute) exacerbation: Secondary | ICD-10-CM | POA: Diagnosis not present

## 2020-12-13 DIAGNOSIS — M810 Age-related osteoporosis without current pathological fracture: Secondary | ICD-10-CM | POA: Diagnosis not present

## 2020-12-13 DIAGNOSIS — F32A Depression, unspecified: Secondary | ICD-10-CM | POA: Diagnosis not present

## 2020-12-15 DIAGNOSIS — K219 Gastro-esophageal reflux disease without esophagitis: Secondary | ICD-10-CM | POA: Diagnosis not present

## 2020-12-15 DIAGNOSIS — F32A Depression, unspecified: Secondary | ICD-10-CM | POA: Diagnosis not present

## 2020-12-15 DIAGNOSIS — J441 Chronic obstructive pulmonary disease with (acute) exacerbation: Secondary | ICD-10-CM | POA: Diagnosis not present

## 2020-12-15 DIAGNOSIS — F419 Anxiety disorder, unspecified: Secondary | ICD-10-CM | POA: Diagnosis not present

## 2020-12-15 DIAGNOSIS — Z20828 Contact with and (suspected) exposure to other viral communicable diseases: Secondary | ICD-10-CM | POA: Diagnosis not present

## 2020-12-15 DIAGNOSIS — M810 Age-related osteoporosis without current pathological fracture: Secondary | ICD-10-CM | POA: Diagnosis not present

## 2020-12-15 DIAGNOSIS — G894 Chronic pain syndrome: Secondary | ICD-10-CM | POA: Diagnosis not present

## 2020-12-17 DIAGNOSIS — K219 Gastro-esophageal reflux disease without esophagitis: Secondary | ICD-10-CM | POA: Diagnosis not present

## 2020-12-17 DIAGNOSIS — G894 Chronic pain syndrome: Secondary | ICD-10-CM | POA: Diagnosis not present

## 2020-12-17 DIAGNOSIS — F32A Depression, unspecified: Secondary | ICD-10-CM | POA: Diagnosis not present

## 2020-12-17 DIAGNOSIS — F419 Anxiety disorder, unspecified: Secondary | ICD-10-CM | POA: Diagnosis not present

## 2020-12-17 DIAGNOSIS — J441 Chronic obstructive pulmonary disease with (acute) exacerbation: Secondary | ICD-10-CM | POA: Diagnosis not present

## 2020-12-17 DIAGNOSIS — M810 Age-related osteoporosis without current pathological fracture: Secondary | ICD-10-CM | POA: Diagnosis not present

## 2020-12-19 DIAGNOSIS — K219 Gastro-esophageal reflux disease without esophagitis: Secondary | ICD-10-CM | POA: Diagnosis not present

## 2020-12-19 DIAGNOSIS — M810 Age-related osteoporosis without current pathological fracture: Secondary | ICD-10-CM | POA: Diagnosis not present

## 2020-12-19 DIAGNOSIS — F32A Depression, unspecified: Secondary | ICD-10-CM | POA: Diagnosis not present

## 2020-12-19 DIAGNOSIS — F419 Anxiety disorder, unspecified: Secondary | ICD-10-CM | POA: Diagnosis not present

## 2020-12-19 DIAGNOSIS — G894 Chronic pain syndrome: Secondary | ICD-10-CM | POA: Diagnosis not present

## 2020-12-19 DIAGNOSIS — J441 Chronic obstructive pulmonary disease with (acute) exacerbation: Secondary | ICD-10-CM | POA: Diagnosis not present

## 2020-12-20 DIAGNOSIS — F32A Depression, unspecified: Secondary | ICD-10-CM | POA: Diagnosis not present

## 2020-12-20 DIAGNOSIS — J441 Chronic obstructive pulmonary disease with (acute) exacerbation: Secondary | ICD-10-CM | POA: Diagnosis not present

## 2020-12-20 DIAGNOSIS — M810 Age-related osteoporosis without current pathological fracture: Secondary | ICD-10-CM | POA: Diagnosis not present

## 2020-12-20 DIAGNOSIS — K219 Gastro-esophageal reflux disease without esophagitis: Secondary | ICD-10-CM | POA: Diagnosis not present

## 2020-12-20 DIAGNOSIS — F419 Anxiety disorder, unspecified: Secondary | ICD-10-CM | POA: Diagnosis not present

## 2020-12-20 DIAGNOSIS — G894 Chronic pain syndrome: Secondary | ICD-10-CM | POA: Diagnosis not present

## 2020-12-22 DIAGNOSIS — F419 Anxiety disorder, unspecified: Secondary | ICD-10-CM | POA: Diagnosis not present

## 2020-12-22 DIAGNOSIS — M810 Age-related osteoporosis without current pathological fracture: Secondary | ICD-10-CM | POA: Diagnosis not present

## 2020-12-22 DIAGNOSIS — J441 Chronic obstructive pulmonary disease with (acute) exacerbation: Secondary | ICD-10-CM | POA: Diagnosis not present

## 2020-12-22 DIAGNOSIS — G894 Chronic pain syndrome: Secondary | ICD-10-CM | POA: Diagnosis not present

## 2020-12-22 DIAGNOSIS — F32A Depression, unspecified: Secondary | ICD-10-CM | POA: Diagnosis not present

## 2020-12-22 DIAGNOSIS — K219 Gastro-esophageal reflux disease without esophagitis: Secondary | ICD-10-CM | POA: Diagnosis not present

## 2020-12-24 DIAGNOSIS — J441 Chronic obstructive pulmonary disease with (acute) exacerbation: Secondary | ICD-10-CM | POA: Diagnosis not present

## 2020-12-24 DIAGNOSIS — F32A Depression, unspecified: Secondary | ICD-10-CM | POA: Diagnosis not present

## 2020-12-24 DIAGNOSIS — M810 Age-related osteoporosis without current pathological fracture: Secondary | ICD-10-CM | POA: Diagnosis not present

## 2020-12-24 DIAGNOSIS — G894 Chronic pain syndrome: Secondary | ICD-10-CM | POA: Diagnosis not present

## 2020-12-24 DIAGNOSIS — F419 Anxiety disorder, unspecified: Secondary | ICD-10-CM | POA: Diagnosis not present

## 2020-12-24 DIAGNOSIS — K219 Gastro-esophageal reflux disease without esophagitis: Secondary | ICD-10-CM | POA: Diagnosis not present

## 2020-12-25 DIAGNOSIS — F32A Depression, unspecified: Secondary | ICD-10-CM | POA: Diagnosis not present

## 2020-12-25 DIAGNOSIS — J441 Chronic obstructive pulmonary disease with (acute) exacerbation: Secondary | ICD-10-CM | POA: Diagnosis not present

## 2020-12-25 DIAGNOSIS — G894 Chronic pain syndrome: Secondary | ICD-10-CM | POA: Diagnosis not present

## 2020-12-25 DIAGNOSIS — F419 Anxiety disorder, unspecified: Secondary | ICD-10-CM | POA: Diagnosis not present

## 2020-12-25 DIAGNOSIS — K219 Gastro-esophageal reflux disease without esophagitis: Secondary | ICD-10-CM | POA: Diagnosis not present

## 2020-12-25 DIAGNOSIS — M810 Age-related osteoporosis without current pathological fracture: Secondary | ICD-10-CM | POA: Diagnosis not present

## 2020-12-29 DIAGNOSIS — F419 Anxiety disorder, unspecified: Secondary | ICD-10-CM | POA: Diagnosis not present

## 2020-12-29 DIAGNOSIS — M810 Age-related osteoporosis without current pathological fracture: Secondary | ICD-10-CM | POA: Diagnosis not present

## 2020-12-29 DIAGNOSIS — J441 Chronic obstructive pulmonary disease with (acute) exacerbation: Secondary | ICD-10-CM | POA: Diagnosis not present

## 2020-12-29 DIAGNOSIS — F32A Depression, unspecified: Secondary | ICD-10-CM | POA: Diagnosis not present

## 2020-12-29 DIAGNOSIS — K219 Gastro-esophageal reflux disease without esophagitis: Secondary | ICD-10-CM | POA: Diagnosis not present

## 2020-12-29 DIAGNOSIS — G894 Chronic pain syndrome: Secondary | ICD-10-CM | POA: Diagnosis not present

## 2020-12-29 DIAGNOSIS — Z20828 Contact with and (suspected) exposure to other viral communicable diseases: Secondary | ICD-10-CM | POA: Diagnosis not present

## 2020-12-31 DIAGNOSIS — F32A Depression, unspecified: Secondary | ICD-10-CM | POA: Diagnosis not present

## 2020-12-31 DIAGNOSIS — F419 Anxiety disorder, unspecified: Secondary | ICD-10-CM | POA: Diagnosis not present

## 2020-12-31 DIAGNOSIS — G894 Chronic pain syndrome: Secondary | ICD-10-CM | POA: Diagnosis not present

## 2020-12-31 DIAGNOSIS — K219 Gastro-esophageal reflux disease without esophagitis: Secondary | ICD-10-CM | POA: Diagnosis not present

## 2020-12-31 DIAGNOSIS — M810 Age-related osteoporosis without current pathological fracture: Secondary | ICD-10-CM | POA: Diagnosis not present

## 2020-12-31 DIAGNOSIS — J441 Chronic obstructive pulmonary disease with (acute) exacerbation: Secondary | ICD-10-CM | POA: Diagnosis not present

## 2021-01-05 DIAGNOSIS — Z8616 Personal history of COVID-19: Secondary | ICD-10-CM | POA: Diagnosis not present

## 2021-01-06 DIAGNOSIS — F419 Anxiety disorder, unspecified: Secondary | ICD-10-CM | POA: Diagnosis not present

## 2021-01-06 DIAGNOSIS — G894 Chronic pain syndrome: Secondary | ICD-10-CM | POA: Diagnosis not present

## 2021-01-06 DIAGNOSIS — J441 Chronic obstructive pulmonary disease with (acute) exacerbation: Secondary | ICD-10-CM | POA: Diagnosis not present

## 2021-01-06 DIAGNOSIS — F32A Depression, unspecified: Secondary | ICD-10-CM | POA: Diagnosis not present

## 2021-01-06 DIAGNOSIS — M810 Age-related osteoporosis without current pathological fracture: Secondary | ICD-10-CM | POA: Diagnosis not present

## 2021-01-06 DIAGNOSIS — K219 Gastro-esophageal reflux disease without esophagitis: Secondary | ICD-10-CM | POA: Diagnosis not present

## 2021-01-08 DIAGNOSIS — Z515 Encounter for palliative care: Secondary | ICD-10-CM | POA: Diagnosis not present

## 2021-01-08 DIAGNOSIS — F32A Depression, unspecified: Secondary | ICD-10-CM | POA: Diagnosis not present

## 2021-01-08 DIAGNOSIS — K219 Gastro-esophageal reflux disease without esophagitis: Secondary | ICD-10-CM | POA: Diagnosis not present

## 2021-01-08 DIAGNOSIS — M810 Age-related osteoporosis without current pathological fracture: Secondary | ICD-10-CM | POA: Diagnosis not present

## 2021-01-08 DIAGNOSIS — Z8701 Personal history of pneumonia (recurrent): Secondary | ICD-10-CM | POA: Diagnosis not present

## 2021-01-08 DIAGNOSIS — F419 Anxiety disorder, unspecified: Secondary | ICD-10-CM | POA: Diagnosis not present

## 2021-01-08 DIAGNOSIS — G894 Chronic pain syndrome: Secondary | ICD-10-CM | POA: Diagnosis not present

## 2021-01-08 DIAGNOSIS — J441 Chronic obstructive pulmonary disease with (acute) exacerbation: Secondary | ICD-10-CM | POA: Diagnosis not present

## 2021-01-09 DIAGNOSIS — F419 Anxiety disorder, unspecified: Secondary | ICD-10-CM | POA: Diagnosis not present

## 2021-01-09 DIAGNOSIS — G894 Chronic pain syndrome: Secondary | ICD-10-CM | POA: Diagnosis not present

## 2021-01-09 DIAGNOSIS — F32A Depression, unspecified: Secondary | ICD-10-CM | POA: Diagnosis not present

## 2021-01-09 DIAGNOSIS — J441 Chronic obstructive pulmonary disease with (acute) exacerbation: Secondary | ICD-10-CM | POA: Diagnosis not present

## 2021-01-09 DIAGNOSIS — K219 Gastro-esophageal reflux disease without esophagitis: Secondary | ICD-10-CM | POA: Diagnosis not present

## 2021-01-09 DIAGNOSIS — M810 Age-related osteoporosis without current pathological fracture: Secondary | ICD-10-CM | POA: Diagnosis not present

## 2021-01-12 DIAGNOSIS — Z8616 Personal history of COVID-19: Secondary | ICD-10-CM | POA: Diagnosis not present

## 2021-01-14 DIAGNOSIS — F32A Depression, unspecified: Secondary | ICD-10-CM | POA: Diagnosis not present

## 2021-01-14 DIAGNOSIS — K219 Gastro-esophageal reflux disease without esophagitis: Secondary | ICD-10-CM | POA: Diagnosis not present

## 2021-01-14 DIAGNOSIS — J441 Chronic obstructive pulmonary disease with (acute) exacerbation: Secondary | ICD-10-CM | POA: Diagnosis not present

## 2021-01-14 DIAGNOSIS — M810 Age-related osteoporosis without current pathological fracture: Secondary | ICD-10-CM | POA: Diagnosis not present

## 2021-01-14 DIAGNOSIS — G894 Chronic pain syndrome: Secondary | ICD-10-CM | POA: Diagnosis not present

## 2021-01-14 DIAGNOSIS — F419 Anxiety disorder, unspecified: Secondary | ICD-10-CM | POA: Diagnosis not present

## 2021-01-15 DIAGNOSIS — M810 Age-related osteoporosis without current pathological fracture: Secondary | ICD-10-CM | POA: Diagnosis not present

## 2021-01-15 DIAGNOSIS — K219 Gastro-esophageal reflux disease without esophagitis: Secondary | ICD-10-CM | POA: Diagnosis not present

## 2021-01-15 DIAGNOSIS — F32A Depression, unspecified: Secondary | ICD-10-CM | POA: Diagnosis not present

## 2021-01-15 DIAGNOSIS — G894 Chronic pain syndrome: Secondary | ICD-10-CM | POA: Diagnosis not present

## 2021-01-15 DIAGNOSIS — F419 Anxiety disorder, unspecified: Secondary | ICD-10-CM | POA: Diagnosis not present

## 2021-01-15 DIAGNOSIS — J441 Chronic obstructive pulmonary disease with (acute) exacerbation: Secondary | ICD-10-CM | POA: Diagnosis not present

## 2021-01-16 DIAGNOSIS — F419 Anxiety disorder, unspecified: Secondary | ICD-10-CM | POA: Diagnosis not present

## 2021-01-16 DIAGNOSIS — G894 Chronic pain syndrome: Secondary | ICD-10-CM | POA: Diagnosis not present

## 2021-01-16 DIAGNOSIS — M810 Age-related osteoporosis without current pathological fracture: Secondary | ICD-10-CM | POA: Diagnosis not present

## 2021-01-16 DIAGNOSIS — J441 Chronic obstructive pulmonary disease with (acute) exacerbation: Secondary | ICD-10-CM | POA: Diagnosis not present

## 2021-01-16 DIAGNOSIS — K219 Gastro-esophageal reflux disease without esophagitis: Secondary | ICD-10-CM | POA: Diagnosis not present

## 2021-01-16 DIAGNOSIS — F32A Depression, unspecified: Secondary | ICD-10-CM | POA: Diagnosis not present

## 2021-01-19 DIAGNOSIS — F419 Anxiety disorder, unspecified: Secondary | ICD-10-CM | POA: Diagnosis not present

## 2021-01-19 DIAGNOSIS — K219 Gastro-esophageal reflux disease without esophagitis: Secondary | ICD-10-CM | POA: Diagnosis not present

## 2021-01-19 DIAGNOSIS — M810 Age-related osteoporosis without current pathological fracture: Secondary | ICD-10-CM | POA: Diagnosis not present

## 2021-01-19 DIAGNOSIS — Z8616 Personal history of COVID-19: Secondary | ICD-10-CM | POA: Diagnosis not present

## 2021-01-19 DIAGNOSIS — G894 Chronic pain syndrome: Secondary | ICD-10-CM | POA: Diagnosis not present

## 2021-01-19 DIAGNOSIS — F32A Depression, unspecified: Secondary | ICD-10-CM | POA: Diagnosis not present

## 2021-01-19 DIAGNOSIS — J441 Chronic obstructive pulmonary disease with (acute) exacerbation: Secondary | ICD-10-CM | POA: Diagnosis not present

## 2021-01-21 DIAGNOSIS — G894 Chronic pain syndrome: Secondary | ICD-10-CM | POA: Diagnosis not present

## 2021-01-21 DIAGNOSIS — F419 Anxiety disorder, unspecified: Secondary | ICD-10-CM | POA: Diagnosis not present

## 2021-01-21 DIAGNOSIS — K219 Gastro-esophageal reflux disease without esophagitis: Secondary | ICD-10-CM | POA: Diagnosis not present

## 2021-01-21 DIAGNOSIS — F32A Depression, unspecified: Secondary | ICD-10-CM | POA: Diagnosis not present

## 2021-01-21 DIAGNOSIS — J441 Chronic obstructive pulmonary disease with (acute) exacerbation: Secondary | ICD-10-CM | POA: Diagnosis not present

## 2021-01-21 DIAGNOSIS — M810 Age-related osteoporosis without current pathological fracture: Secondary | ICD-10-CM | POA: Diagnosis not present

## 2021-01-25 DIAGNOSIS — G894 Chronic pain syndrome: Secondary | ICD-10-CM | POA: Diagnosis not present

## 2021-01-25 DIAGNOSIS — M810 Age-related osteoporosis without current pathological fracture: Secondary | ICD-10-CM | POA: Diagnosis not present

## 2021-01-25 DIAGNOSIS — J441 Chronic obstructive pulmonary disease with (acute) exacerbation: Secondary | ICD-10-CM | POA: Diagnosis not present

## 2021-01-25 DIAGNOSIS — K219 Gastro-esophageal reflux disease without esophagitis: Secondary | ICD-10-CM | POA: Diagnosis not present

## 2021-01-25 DIAGNOSIS — F419 Anxiety disorder, unspecified: Secondary | ICD-10-CM | POA: Diagnosis not present

## 2021-01-25 DIAGNOSIS — F32A Depression, unspecified: Secondary | ICD-10-CM | POA: Diagnosis not present

## 2021-01-26 DIAGNOSIS — J441 Chronic obstructive pulmonary disease with (acute) exacerbation: Secondary | ICD-10-CM | POA: Diagnosis not present

## 2021-01-26 DIAGNOSIS — M810 Age-related osteoporosis without current pathological fracture: Secondary | ICD-10-CM | POA: Diagnosis not present

## 2021-01-26 DIAGNOSIS — Z8616 Personal history of COVID-19: Secondary | ICD-10-CM | POA: Diagnosis not present

## 2021-01-26 DIAGNOSIS — K219 Gastro-esophageal reflux disease without esophagitis: Secondary | ICD-10-CM | POA: Diagnosis not present

## 2021-01-26 DIAGNOSIS — F419 Anxiety disorder, unspecified: Secondary | ICD-10-CM | POA: Diagnosis not present

## 2021-01-26 DIAGNOSIS — F32A Depression, unspecified: Secondary | ICD-10-CM | POA: Diagnosis not present

## 2021-01-26 DIAGNOSIS — G894 Chronic pain syndrome: Secondary | ICD-10-CM | POA: Diagnosis not present

## 2021-01-28 DIAGNOSIS — K219 Gastro-esophageal reflux disease without esophagitis: Secondary | ICD-10-CM | POA: Diagnosis not present

## 2021-01-28 DIAGNOSIS — F419 Anxiety disorder, unspecified: Secondary | ICD-10-CM | POA: Diagnosis not present

## 2021-01-28 DIAGNOSIS — J441 Chronic obstructive pulmonary disease with (acute) exacerbation: Secondary | ICD-10-CM | POA: Diagnosis not present

## 2021-01-28 DIAGNOSIS — G894 Chronic pain syndrome: Secondary | ICD-10-CM | POA: Diagnosis not present

## 2021-01-28 DIAGNOSIS — M810 Age-related osteoporosis without current pathological fracture: Secondary | ICD-10-CM | POA: Diagnosis not present

## 2021-01-28 DIAGNOSIS — F32A Depression, unspecified: Secondary | ICD-10-CM | POA: Diagnosis not present

## 2021-01-29 DIAGNOSIS — J441 Chronic obstructive pulmonary disease with (acute) exacerbation: Secondary | ICD-10-CM | POA: Diagnosis not present

## 2021-01-29 DIAGNOSIS — M810 Age-related osteoporosis without current pathological fracture: Secondary | ICD-10-CM | POA: Diagnosis not present

## 2021-01-29 DIAGNOSIS — F419 Anxiety disorder, unspecified: Secondary | ICD-10-CM | POA: Diagnosis not present

## 2021-01-29 DIAGNOSIS — F32A Depression, unspecified: Secondary | ICD-10-CM | POA: Diagnosis not present

## 2021-01-29 DIAGNOSIS — G894 Chronic pain syndrome: Secondary | ICD-10-CM | POA: Diagnosis not present

## 2021-01-29 DIAGNOSIS — K219 Gastro-esophageal reflux disease without esophagitis: Secondary | ICD-10-CM | POA: Diagnosis not present

## 2021-01-30 DIAGNOSIS — F419 Anxiety disorder, unspecified: Secondary | ICD-10-CM | POA: Diagnosis not present

## 2021-01-30 DIAGNOSIS — M810 Age-related osteoporosis without current pathological fracture: Secondary | ICD-10-CM | POA: Diagnosis not present

## 2021-01-30 DIAGNOSIS — K219 Gastro-esophageal reflux disease without esophagitis: Secondary | ICD-10-CM | POA: Diagnosis not present

## 2021-01-30 DIAGNOSIS — F32A Depression, unspecified: Secondary | ICD-10-CM | POA: Diagnosis not present

## 2021-01-30 DIAGNOSIS — G894 Chronic pain syndrome: Secondary | ICD-10-CM | POA: Diagnosis not present

## 2021-01-30 DIAGNOSIS — J441 Chronic obstructive pulmonary disease with (acute) exacerbation: Secondary | ICD-10-CM | POA: Diagnosis not present

## 2021-01-31 DIAGNOSIS — F419 Anxiety disorder, unspecified: Secondary | ICD-10-CM | POA: Diagnosis not present

## 2021-01-31 DIAGNOSIS — G894 Chronic pain syndrome: Secondary | ICD-10-CM | POA: Diagnosis not present

## 2021-01-31 DIAGNOSIS — K219 Gastro-esophageal reflux disease without esophagitis: Secondary | ICD-10-CM | POA: Diagnosis not present

## 2021-01-31 DIAGNOSIS — M810 Age-related osteoporosis without current pathological fracture: Secondary | ICD-10-CM | POA: Diagnosis not present

## 2021-01-31 DIAGNOSIS — F32A Depression, unspecified: Secondary | ICD-10-CM | POA: Diagnosis not present

## 2021-01-31 DIAGNOSIS — J441 Chronic obstructive pulmonary disease with (acute) exacerbation: Secondary | ICD-10-CM | POA: Diagnosis not present

## 2021-02-02 DIAGNOSIS — K219 Gastro-esophageal reflux disease without esophagitis: Secondary | ICD-10-CM | POA: Diagnosis not present

## 2021-02-02 DIAGNOSIS — F419 Anxiety disorder, unspecified: Secondary | ICD-10-CM | POA: Diagnosis not present

## 2021-02-02 DIAGNOSIS — J441 Chronic obstructive pulmonary disease with (acute) exacerbation: Secondary | ICD-10-CM | POA: Diagnosis not present

## 2021-02-02 DIAGNOSIS — M810 Age-related osteoporosis without current pathological fracture: Secondary | ICD-10-CM | POA: Diagnosis not present

## 2021-02-02 DIAGNOSIS — F32A Depression, unspecified: Secondary | ICD-10-CM | POA: Diagnosis not present

## 2021-02-02 DIAGNOSIS — G894 Chronic pain syndrome: Secondary | ICD-10-CM | POA: Diagnosis not present

## 2021-02-02 DIAGNOSIS — Z20828 Contact with and (suspected) exposure to other viral communicable diseases: Secondary | ICD-10-CM | POA: Diagnosis not present

## 2021-02-04 DIAGNOSIS — K219 Gastro-esophageal reflux disease without esophagitis: Secondary | ICD-10-CM | POA: Diagnosis not present

## 2021-02-04 DIAGNOSIS — G894 Chronic pain syndrome: Secondary | ICD-10-CM | POA: Diagnosis not present

## 2021-02-04 DIAGNOSIS — F32A Depression, unspecified: Secondary | ICD-10-CM | POA: Diagnosis not present

## 2021-02-04 DIAGNOSIS — F419 Anxiety disorder, unspecified: Secondary | ICD-10-CM | POA: Diagnosis not present

## 2021-02-04 DIAGNOSIS — J441 Chronic obstructive pulmonary disease with (acute) exacerbation: Secondary | ICD-10-CM | POA: Diagnosis not present

## 2021-02-04 DIAGNOSIS — M810 Age-related osteoporosis without current pathological fracture: Secondary | ICD-10-CM | POA: Diagnosis not present

## 2021-02-05 DIAGNOSIS — F419 Anxiety disorder, unspecified: Secondary | ICD-10-CM | POA: Diagnosis not present

## 2021-02-05 DIAGNOSIS — G894 Chronic pain syndrome: Secondary | ICD-10-CM | POA: Diagnosis not present

## 2021-02-05 DIAGNOSIS — F32A Depression, unspecified: Secondary | ICD-10-CM | POA: Diagnosis not present

## 2021-02-05 DIAGNOSIS — J441 Chronic obstructive pulmonary disease with (acute) exacerbation: Secondary | ICD-10-CM | POA: Diagnosis not present

## 2021-02-05 DIAGNOSIS — M810 Age-related osteoporosis without current pathological fracture: Secondary | ICD-10-CM | POA: Diagnosis not present

## 2021-02-05 DIAGNOSIS — K219 Gastro-esophageal reflux disease without esophagitis: Secondary | ICD-10-CM | POA: Diagnosis not present

## 2021-02-07 DIAGNOSIS — F32A Depression, unspecified: Secondary | ICD-10-CM | POA: Diagnosis not present

## 2021-02-07 DIAGNOSIS — M810 Age-related osteoporosis without current pathological fracture: Secondary | ICD-10-CM | POA: Diagnosis not present

## 2021-02-07 DIAGNOSIS — K219 Gastro-esophageal reflux disease without esophagitis: Secondary | ICD-10-CM | POA: Diagnosis not present

## 2021-02-07 DIAGNOSIS — G894 Chronic pain syndrome: Secondary | ICD-10-CM | POA: Diagnosis not present

## 2021-02-07 DIAGNOSIS — Z8701 Personal history of pneumonia (recurrent): Secondary | ICD-10-CM | POA: Diagnosis not present

## 2021-02-07 DIAGNOSIS — F419 Anxiety disorder, unspecified: Secondary | ICD-10-CM | POA: Diagnosis not present

## 2021-02-07 DIAGNOSIS — J441 Chronic obstructive pulmonary disease with (acute) exacerbation: Secondary | ICD-10-CM | POA: Diagnosis not present

## 2021-02-07 DIAGNOSIS — Z515 Encounter for palliative care: Secondary | ICD-10-CM | POA: Diagnosis not present

## 2021-02-09 DIAGNOSIS — Z8616 Personal history of COVID-19: Secondary | ICD-10-CM | POA: Diagnosis not present

## 2021-02-09 DIAGNOSIS — G894 Chronic pain syndrome: Secondary | ICD-10-CM | POA: Diagnosis not present

## 2021-02-09 DIAGNOSIS — M810 Age-related osteoporosis without current pathological fracture: Secondary | ICD-10-CM | POA: Diagnosis not present

## 2021-02-09 DIAGNOSIS — J441 Chronic obstructive pulmonary disease with (acute) exacerbation: Secondary | ICD-10-CM | POA: Diagnosis not present

## 2021-02-09 DIAGNOSIS — F419 Anxiety disorder, unspecified: Secondary | ICD-10-CM | POA: Diagnosis not present

## 2021-02-09 DIAGNOSIS — K219 Gastro-esophageal reflux disease without esophagitis: Secondary | ICD-10-CM | POA: Diagnosis not present

## 2021-02-09 DIAGNOSIS — F32A Depression, unspecified: Secondary | ICD-10-CM | POA: Diagnosis not present

## 2021-02-10 DIAGNOSIS — F32A Depression, unspecified: Secondary | ICD-10-CM | POA: Diagnosis not present

## 2021-02-10 DIAGNOSIS — G894 Chronic pain syndrome: Secondary | ICD-10-CM | POA: Diagnosis not present

## 2021-02-10 DIAGNOSIS — J441 Chronic obstructive pulmonary disease with (acute) exacerbation: Secondary | ICD-10-CM | POA: Diagnosis not present

## 2021-02-10 DIAGNOSIS — F419 Anxiety disorder, unspecified: Secondary | ICD-10-CM | POA: Diagnosis not present

## 2021-02-10 DIAGNOSIS — M810 Age-related osteoporosis without current pathological fracture: Secondary | ICD-10-CM | POA: Diagnosis not present

## 2021-02-10 DIAGNOSIS — K219 Gastro-esophageal reflux disease without esophagitis: Secondary | ICD-10-CM | POA: Diagnosis not present

## 2021-02-13 DIAGNOSIS — K219 Gastro-esophageal reflux disease without esophagitis: Secondary | ICD-10-CM | POA: Diagnosis not present

## 2021-02-13 DIAGNOSIS — G894 Chronic pain syndrome: Secondary | ICD-10-CM | POA: Diagnosis not present

## 2021-02-13 DIAGNOSIS — M810 Age-related osteoporosis without current pathological fracture: Secondary | ICD-10-CM | POA: Diagnosis not present

## 2021-02-13 DIAGNOSIS — F419 Anxiety disorder, unspecified: Secondary | ICD-10-CM | POA: Diagnosis not present

## 2021-02-13 DIAGNOSIS — J441 Chronic obstructive pulmonary disease with (acute) exacerbation: Secondary | ICD-10-CM | POA: Diagnosis not present

## 2021-02-13 DIAGNOSIS — F32A Depression, unspecified: Secondary | ICD-10-CM | POA: Diagnosis not present

## 2021-02-16 DIAGNOSIS — Z8616 Personal history of COVID-19: Secondary | ICD-10-CM | POA: Diagnosis not present

## 2021-02-18 DIAGNOSIS — M810 Age-related osteoporosis without current pathological fracture: Secondary | ICD-10-CM | POA: Diagnosis not present

## 2021-02-18 DIAGNOSIS — K219 Gastro-esophageal reflux disease without esophagitis: Secondary | ICD-10-CM | POA: Diagnosis not present

## 2021-02-18 DIAGNOSIS — G894 Chronic pain syndrome: Secondary | ICD-10-CM | POA: Diagnosis not present

## 2021-02-18 DIAGNOSIS — F419 Anxiety disorder, unspecified: Secondary | ICD-10-CM | POA: Diagnosis not present

## 2021-02-18 DIAGNOSIS — F32A Depression, unspecified: Secondary | ICD-10-CM | POA: Diagnosis not present

## 2021-02-18 DIAGNOSIS — J441 Chronic obstructive pulmonary disease with (acute) exacerbation: Secondary | ICD-10-CM | POA: Diagnosis not present

## 2021-02-20 DIAGNOSIS — J441 Chronic obstructive pulmonary disease with (acute) exacerbation: Secondary | ICD-10-CM | POA: Diagnosis not present

## 2021-02-20 DIAGNOSIS — G894 Chronic pain syndrome: Secondary | ICD-10-CM | POA: Diagnosis not present

## 2021-02-20 DIAGNOSIS — F419 Anxiety disorder, unspecified: Secondary | ICD-10-CM | POA: Diagnosis not present

## 2021-02-20 DIAGNOSIS — M810 Age-related osteoporosis without current pathological fracture: Secondary | ICD-10-CM | POA: Diagnosis not present

## 2021-02-20 DIAGNOSIS — F32A Depression, unspecified: Secondary | ICD-10-CM | POA: Diagnosis not present

## 2021-02-20 DIAGNOSIS — K219 Gastro-esophageal reflux disease without esophagitis: Secondary | ICD-10-CM | POA: Diagnosis not present

## 2021-02-21 DIAGNOSIS — K219 Gastro-esophageal reflux disease without esophagitis: Secondary | ICD-10-CM | POA: Diagnosis not present

## 2021-02-21 DIAGNOSIS — F419 Anxiety disorder, unspecified: Secondary | ICD-10-CM | POA: Diagnosis not present

## 2021-02-21 DIAGNOSIS — M810 Age-related osteoporosis without current pathological fracture: Secondary | ICD-10-CM | POA: Diagnosis not present

## 2021-02-21 DIAGNOSIS — F32A Depression, unspecified: Secondary | ICD-10-CM | POA: Diagnosis not present

## 2021-02-21 DIAGNOSIS — G894 Chronic pain syndrome: Secondary | ICD-10-CM | POA: Diagnosis not present

## 2021-02-21 DIAGNOSIS — J441 Chronic obstructive pulmonary disease with (acute) exacerbation: Secondary | ICD-10-CM | POA: Diagnosis not present

## 2021-02-23 DIAGNOSIS — K219 Gastro-esophageal reflux disease without esophagitis: Secondary | ICD-10-CM | POA: Diagnosis not present

## 2021-02-23 DIAGNOSIS — Z20828 Contact with and (suspected) exposure to other viral communicable diseases: Secondary | ICD-10-CM | POA: Diagnosis not present

## 2021-02-23 DIAGNOSIS — G894 Chronic pain syndrome: Secondary | ICD-10-CM | POA: Diagnosis not present

## 2021-02-23 DIAGNOSIS — F419 Anxiety disorder, unspecified: Secondary | ICD-10-CM | POA: Diagnosis not present

## 2021-02-23 DIAGNOSIS — J441 Chronic obstructive pulmonary disease with (acute) exacerbation: Secondary | ICD-10-CM | POA: Diagnosis not present

## 2021-02-23 DIAGNOSIS — F32A Depression, unspecified: Secondary | ICD-10-CM | POA: Diagnosis not present

## 2021-02-23 DIAGNOSIS — M810 Age-related osteoporosis without current pathological fracture: Secondary | ICD-10-CM | POA: Diagnosis not present

## 2021-02-25 DIAGNOSIS — M810 Age-related osteoporosis without current pathological fracture: Secondary | ICD-10-CM | POA: Diagnosis not present

## 2021-02-25 DIAGNOSIS — K219 Gastro-esophageal reflux disease without esophagitis: Secondary | ICD-10-CM | POA: Diagnosis not present

## 2021-02-25 DIAGNOSIS — F32A Depression, unspecified: Secondary | ICD-10-CM | POA: Diagnosis not present

## 2021-02-25 DIAGNOSIS — J441 Chronic obstructive pulmonary disease with (acute) exacerbation: Secondary | ICD-10-CM | POA: Diagnosis not present

## 2021-02-25 DIAGNOSIS — F419 Anxiety disorder, unspecified: Secondary | ICD-10-CM | POA: Diagnosis not present

## 2021-02-25 DIAGNOSIS — G894 Chronic pain syndrome: Secondary | ICD-10-CM | POA: Diagnosis not present

## 2021-02-26 DIAGNOSIS — F32A Depression, unspecified: Secondary | ICD-10-CM | POA: Diagnosis not present

## 2021-02-26 DIAGNOSIS — J441 Chronic obstructive pulmonary disease with (acute) exacerbation: Secondary | ICD-10-CM | POA: Diagnosis not present

## 2021-02-26 DIAGNOSIS — K219 Gastro-esophageal reflux disease without esophagitis: Secondary | ICD-10-CM | POA: Diagnosis not present

## 2021-02-26 DIAGNOSIS — F419 Anxiety disorder, unspecified: Secondary | ICD-10-CM | POA: Diagnosis not present

## 2021-02-26 DIAGNOSIS — M810 Age-related osteoporosis without current pathological fracture: Secondary | ICD-10-CM | POA: Diagnosis not present

## 2021-02-26 DIAGNOSIS — G894 Chronic pain syndrome: Secondary | ICD-10-CM | POA: Diagnosis not present

## 2021-03-01 DIAGNOSIS — F419 Anxiety disorder, unspecified: Secondary | ICD-10-CM | POA: Diagnosis not present

## 2021-03-01 DIAGNOSIS — M810 Age-related osteoporosis without current pathological fracture: Secondary | ICD-10-CM | POA: Diagnosis not present

## 2021-03-01 DIAGNOSIS — F32A Depression, unspecified: Secondary | ICD-10-CM | POA: Diagnosis not present

## 2021-03-01 DIAGNOSIS — J441 Chronic obstructive pulmonary disease with (acute) exacerbation: Secondary | ICD-10-CM | POA: Diagnosis not present

## 2021-03-01 DIAGNOSIS — K219 Gastro-esophageal reflux disease without esophagitis: Secondary | ICD-10-CM | POA: Diagnosis not present

## 2021-03-01 DIAGNOSIS — G894 Chronic pain syndrome: Secondary | ICD-10-CM | POA: Diagnosis not present

## 2021-03-02 DIAGNOSIS — G894 Chronic pain syndrome: Secondary | ICD-10-CM | POA: Diagnosis not present

## 2021-03-02 DIAGNOSIS — M810 Age-related osteoporosis without current pathological fracture: Secondary | ICD-10-CM | POA: Diagnosis not present

## 2021-03-02 DIAGNOSIS — F419 Anxiety disorder, unspecified: Secondary | ICD-10-CM | POA: Diagnosis not present

## 2021-03-02 DIAGNOSIS — F32A Depression, unspecified: Secondary | ICD-10-CM | POA: Diagnosis not present

## 2021-03-02 DIAGNOSIS — K219 Gastro-esophageal reflux disease without esophagitis: Secondary | ICD-10-CM | POA: Diagnosis not present

## 2021-03-02 DIAGNOSIS — Z8616 Personal history of COVID-19: Secondary | ICD-10-CM | POA: Diagnosis not present

## 2021-03-02 DIAGNOSIS — J441 Chronic obstructive pulmonary disease with (acute) exacerbation: Secondary | ICD-10-CM | POA: Diagnosis not present

## 2021-03-03 DIAGNOSIS — J441 Chronic obstructive pulmonary disease with (acute) exacerbation: Secondary | ICD-10-CM | POA: Diagnosis not present

## 2021-03-03 DIAGNOSIS — K219 Gastro-esophageal reflux disease without esophagitis: Secondary | ICD-10-CM | POA: Diagnosis not present

## 2021-03-03 DIAGNOSIS — M810 Age-related osteoporosis without current pathological fracture: Secondary | ICD-10-CM | POA: Diagnosis not present

## 2021-03-03 DIAGNOSIS — G894 Chronic pain syndrome: Secondary | ICD-10-CM | POA: Diagnosis not present

## 2021-03-03 DIAGNOSIS — F32A Depression, unspecified: Secondary | ICD-10-CM | POA: Diagnosis not present

## 2021-03-03 DIAGNOSIS — F419 Anxiety disorder, unspecified: Secondary | ICD-10-CM | POA: Diagnosis not present

## 2021-03-04 DIAGNOSIS — F32A Depression, unspecified: Secondary | ICD-10-CM | POA: Diagnosis not present

## 2021-03-04 DIAGNOSIS — M810 Age-related osteoporosis without current pathological fracture: Secondary | ICD-10-CM | POA: Diagnosis not present

## 2021-03-04 DIAGNOSIS — F419 Anxiety disorder, unspecified: Secondary | ICD-10-CM | POA: Diagnosis not present

## 2021-03-04 DIAGNOSIS — K219 Gastro-esophageal reflux disease without esophagitis: Secondary | ICD-10-CM | POA: Diagnosis not present

## 2021-03-04 DIAGNOSIS — J441 Chronic obstructive pulmonary disease with (acute) exacerbation: Secondary | ICD-10-CM | POA: Diagnosis not present

## 2021-03-04 DIAGNOSIS — G894 Chronic pain syndrome: Secondary | ICD-10-CM | POA: Diagnosis not present

## 2021-03-09 DIAGNOSIS — F419 Anxiety disorder, unspecified: Secondary | ICD-10-CM | POA: Diagnosis not present

## 2021-03-09 DIAGNOSIS — J441 Chronic obstructive pulmonary disease with (acute) exacerbation: Secondary | ICD-10-CM | POA: Diagnosis not present

## 2021-03-09 DIAGNOSIS — Z8616 Personal history of COVID-19: Secondary | ICD-10-CM | POA: Diagnosis not present

## 2021-03-09 DIAGNOSIS — F32A Depression, unspecified: Secondary | ICD-10-CM | POA: Diagnosis not present

## 2021-03-09 DIAGNOSIS — K219 Gastro-esophageal reflux disease without esophagitis: Secondary | ICD-10-CM | POA: Diagnosis not present

## 2021-03-09 DIAGNOSIS — M810 Age-related osteoporosis without current pathological fracture: Secondary | ICD-10-CM | POA: Diagnosis not present

## 2021-03-09 DIAGNOSIS — G894 Chronic pain syndrome: Secondary | ICD-10-CM | POA: Diagnosis not present

## 2021-03-10 DIAGNOSIS — G894 Chronic pain syndrome: Secondary | ICD-10-CM | POA: Diagnosis not present

## 2021-03-10 DIAGNOSIS — Z8701 Personal history of pneumonia (recurrent): Secondary | ICD-10-CM | POA: Diagnosis not present

## 2021-03-10 DIAGNOSIS — F32A Depression, unspecified: Secondary | ICD-10-CM | POA: Diagnosis not present

## 2021-03-10 DIAGNOSIS — F419 Anxiety disorder, unspecified: Secondary | ICD-10-CM | POA: Diagnosis not present

## 2021-03-10 DIAGNOSIS — K219 Gastro-esophageal reflux disease without esophagitis: Secondary | ICD-10-CM | POA: Diagnosis not present

## 2021-03-10 DIAGNOSIS — Z515 Encounter for palliative care: Secondary | ICD-10-CM | POA: Diagnosis not present

## 2021-03-10 DIAGNOSIS — J441 Chronic obstructive pulmonary disease with (acute) exacerbation: Secondary | ICD-10-CM | POA: Diagnosis not present

## 2021-03-10 DIAGNOSIS — M810 Age-related osteoporosis without current pathological fracture: Secondary | ICD-10-CM | POA: Diagnosis not present

## 2021-03-11 DIAGNOSIS — J441 Chronic obstructive pulmonary disease with (acute) exacerbation: Secondary | ICD-10-CM | POA: Diagnosis not present

## 2021-03-11 DIAGNOSIS — K219 Gastro-esophageal reflux disease without esophagitis: Secondary | ICD-10-CM | POA: Diagnosis not present

## 2021-03-11 DIAGNOSIS — G894 Chronic pain syndrome: Secondary | ICD-10-CM | POA: Diagnosis not present

## 2021-03-11 DIAGNOSIS — M810 Age-related osteoporosis without current pathological fracture: Secondary | ICD-10-CM | POA: Diagnosis not present

## 2021-03-11 DIAGNOSIS — F32A Depression, unspecified: Secondary | ICD-10-CM | POA: Diagnosis not present

## 2021-03-11 DIAGNOSIS — F419 Anxiety disorder, unspecified: Secondary | ICD-10-CM | POA: Diagnosis not present

## 2021-03-12 DIAGNOSIS — F419 Anxiety disorder, unspecified: Secondary | ICD-10-CM | POA: Diagnosis not present

## 2021-03-12 DIAGNOSIS — G894 Chronic pain syndrome: Secondary | ICD-10-CM | POA: Diagnosis not present

## 2021-03-12 DIAGNOSIS — J441 Chronic obstructive pulmonary disease with (acute) exacerbation: Secondary | ICD-10-CM | POA: Diagnosis not present

## 2021-03-12 DIAGNOSIS — F32A Depression, unspecified: Secondary | ICD-10-CM | POA: Diagnosis not present

## 2021-03-12 DIAGNOSIS — M810 Age-related osteoporosis without current pathological fracture: Secondary | ICD-10-CM | POA: Diagnosis not present

## 2021-03-12 DIAGNOSIS — K219 Gastro-esophageal reflux disease without esophagitis: Secondary | ICD-10-CM | POA: Diagnosis not present

## 2021-03-13 DIAGNOSIS — F419 Anxiety disorder, unspecified: Secondary | ICD-10-CM | POA: Diagnosis not present

## 2021-03-13 DIAGNOSIS — G894 Chronic pain syndrome: Secondary | ICD-10-CM | POA: Diagnosis not present

## 2021-03-13 DIAGNOSIS — K219 Gastro-esophageal reflux disease without esophagitis: Secondary | ICD-10-CM | POA: Diagnosis not present

## 2021-03-13 DIAGNOSIS — M810 Age-related osteoporosis without current pathological fracture: Secondary | ICD-10-CM | POA: Diagnosis not present

## 2021-03-13 DIAGNOSIS — J441 Chronic obstructive pulmonary disease with (acute) exacerbation: Secondary | ICD-10-CM | POA: Diagnosis not present

## 2021-03-13 DIAGNOSIS — F32A Depression, unspecified: Secondary | ICD-10-CM | POA: Diagnosis not present

## 2021-03-14 DIAGNOSIS — G894 Chronic pain syndrome: Secondary | ICD-10-CM | POA: Diagnosis not present

## 2021-03-14 DIAGNOSIS — F32A Depression, unspecified: Secondary | ICD-10-CM | POA: Diagnosis not present

## 2021-03-14 DIAGNOSIS — J441 Chronic obstructive pulmonary disease with (acute) exacerbation: Secondary | ICD-10-CM | POA: Diagnosis not present

## 2021-03-14 DIAGNOSIS — F419 Anxiety disorder, unspecified: Secondary | ICD-10-CM | POA: Diagnosis not present

## 2021-03-14 DIAGNOSIS — K219 Gastro-esophageal reflux disease without esophagitis: Secondary | ICD-10-CM | POA: Diagnosis not present

## 2021-03-14 DIAGNOSIS — M810 Age-related osteoporosis without current pathological fracture: Secondary | ICD-10-CM | POA: Diagnosis not present

## 2021-03-16 DIAGNOSIS — J441 Chronic obstructive pulmonary disease with (acute) exacerbation: Secondary | ICD-10-CM | POA: Diagnosis not present

## 2021-03-16 DIAGNOSIS — M810 Age-related osteoporosis without current pathological fracture: Secondary | ICD-10-CM | POA: Diagnosis not present

## 2021-03-16 DIAGNOSIS — Z20828 Contact with and (suspected) exposure to other viral communicable diseases: Secondary | ICD-10-CM | POA: Diagnosis not present

## 2021-03-16 DIAGNOSIS — F32A Depression, unspecified: Secondary | ICD-10-CM | POA: Diagnosis not present

## 2021-03-16 DIAGNOSIS — K219 Gastro-esophageal reflux disease without esophagitis: Secondary | ICD-10-CM | POA: Diagnosis not present

## 2021-03-16 DIAGNOSIS — F419 Anxiety disorder, unspecified: Secondary | ICD-10-CM | POA: Diagnosis not present

## 2021-03-16 DIAGNOSIS — G894 Chronic pain syndrome: Secondary | ICD-10-CM | POA: Diagnosis not present

## 2021-03-18 DIAGNOSIS — G894 Chronic pain syndrome: Secondary | ICD-10-CM | POA: Diagnosis not present

## 2021-03-18 DIAGNOSIS — M810 Age-related osteoporosis without current pathological fracture: Secondary | ICD-10-CM | POA: Diagnosis not present

## 2021-03-18 DIAGNOSIS — F32A Depression, unspecified: Secondary | ICD-10-CM | POA: Diagnosis not present

## 2021-03-18 DIAGNOSIS — K219 Gastro-esophageal reflux disease without esophagitis: Secondary | ICD-10-CM | POA: Diagnosis not present

## 2021-03-18 DIAGNOSIS — J441 Chronic obstructive pulmonary disease with (acute) exacerbation: Secondary | ICD-10-CM | POA: Diagnosis not present

## 2021-03-18 DIAGNOSIS — F419 Anxiety disorder, unspecified: Secondary | ICD-10-CM | POA: Diagnosis not present

## 2021-03-23 DIAGNOSIS — Z20828 Contact with and (suspected) exposure to other viral communicable diseases: Secondary | ICD-10-CM | POA: Diagnosis not present

## 2021-03-23 DIAGNOSIS — J441 Chronic obstructive pulmonary disease with (acute) exacerbation: Secondary | ICD-10-CM | POA: Diagnosis not present

## 2021-03-23 DIAGNOSIS — K219 Gastro-esophageal reflux disease without esophagitis: Secondary | ICD-10-CM | POA: Diagnosis not present

## 2021-03-23 DIAGNOSIS — G894 Chronic pain syndrome: Secondary | ICD-10-CM | POA: Diagnosis not present

## 2021-03-23 DIAGNOSIS — F419 Anxiety disorder, unspecified: Secondary | ICD-10-CM | POA: Diagnosis not present

## 2021-03-23 DIAGNOSIS — F32A Depression, unspecified: Secondary | ICD-10-CM | POA: Diagnosis not present

## 2021-03-23 DIAGNOSIS — M810 Age-related osteoporosis without current pathological fracture: Secondary | ICD-10-CM | POA: Diagnosis not present

## 2021-03-26 DIAGNOSIS — F32A Depression, unspecified: Secondary | ICD-10-CM | POA: Diagnosis not present

## 2021-03-26 DIAGNOSIS — G894 Chronic pain syndrome: Secondary | ICD-10-CM | POA: Diagnosis not present

## 2021-03-26 DIAGNOSIS — M810 Age-related osteoporosis without current pathological fracture: Secondary | ICD-10-CM | POA: Diagnosis not present

## 2021-03-26 DIAGNOSIS — J441 Chronic obstructive pulmonary disease with (acute) exacerbation: Secondary | ICD-10-CM | POA: Diagnosis not present

## 2021-03-26 DIAGNOSIS — F419 Anxiety disorder, unspecified: Secondary | ICD-10-CM | POA: Diagnosis not present

## 2021-03-26 DIAGNOSIS — K219 Gastro-esophageal reflux disease without esophagitis: Secondary | ICD-10-CM | POA: Diagnosis not present

## 2021-03-30 DIAGNOSIS — F32A Depression, unspecified: Secondary | ICD-10-CM | POA: Diagnosis not present

## 2021-03-30 DIAGNOSIS — F419 Anxiety disorder, unspecified: Secondary | ICD-10-CM | POA: Diagnosis not present

## 2021-03-30 DIAGNOSIS — M810 Age-related osteoporosis without current pathological fracture: Secondary | ICD-10-CM | POA: Diagnosis not present

## 2021-03-30 DIAGNOSIS — J441 Chronic obstructive pulmonary disease with (acute) exacerbation: Secondary | ICD-10-CM | POA: Diagnosis not present

## 2021-03-30 DIAGNOSIS — K219 Gastro-esophageal reflux disease without esophagitis: Secondary | ICD-10-CM | POA: Diagnosis not present

## 2021-03-30 DIAGNOSIS — G894 Chronic pain syndrome: Secondary | ICD-10-CM | POA: Diagnosis not present

## 2021-03-30 DIAGNOSIS — Z20828 Contact with and (suspected) exposure to other viral communicable diseases: Secondary | ICD-10-CM | POA: Diagnosis not present

## 2021-04-01 DIAGNOSIS — M810 Age-related osteoporosis without current pathological fracture: Secondary | ICD-10-CM | POA: Diagnosis not present

## 2021-04-01 DIAGNOSIS — G894 Chronic pain syndrome: Secondary | ICD-10-CM | POA: Diagnosis not present

## 2021-04-01 DIAGNOSIS — J441 Chronic obstructive pulmonary disease with (acute) exacerbation: Secondary | ICD-10-CM | POA: Diagnosis not present

## 2021-04-01 DIAGNOSIS — F32A Depression, unspecified: Secondary | ICD-10-CM | POA: Diagnosis not present

## 2021-04-01 DIAGNOSIS — F419 Anxiety disorder, unspecified: Secondary | ICD-10-CM | POA: Diagnosis not present

## 2021-04-01 DIAGNOSIS — K219 Gastro-esophageal reflux disease without esophagitis: Secondary | ICD-10-CM | POA: Diagnosis not present

## 2021-04-06 DIAGNOSIS — F32A Depression, unspecified: Secondary | ICD-10-CM | POA: Diagnosis not present

## 2021-04-06 DIAGNOSIS — F419 Anxiety disorder, unspecified: Secondary | ICD-10-CM | POA: Diagnosis not present

## 2021-04-06 DIAGNOSIS — J441 Chronic obstructive pulmonary disease with (acute) exacerbation: Secondary | ICD-10-CM | POA: Diagnosis not present

## 2021-04-06 DIAGNOSIS — Z1159 Encounter for screening for other viral diseases: Secondary | ICD-10-CM | POA: Diagnosis not present

## 2021-04-06 DIAGNOSIS — Z20828 Contact with and (suspected) exposure to other viral communicable diseases: Secondary | ICD-10-CM | POA: Diagnosis not present

## 2021-04-06 DIAGNOSIS — K219 Gastro-esophageal reflux disease without esophagitis: Secondary | ICD-10-CM | POA: Diagnosis not present

## 2021-04-06 DIAGNOSIS — G894 Chronic pain syndrome: Secondary | ICD-10-CM | POA: Diagnosis not present

## 2021-04-06 DIAGNOSIS — M810 Age-related osteoporosis without current pathological fracture: Secondary | ICD-10-CM | POA: Diagnosis not present

## 2021-04-08 DIAGNOSIS — F419 Anxiety disorder, unspecified: Secondary | ICD-10-CM | POA: Diagnosis not present

## 2021-04-08 DIAGNOSIS — J441 Chronic obstructive pulmonary disease with (acute) exacerbation: Secondary | ICD-10-CM | POA: Diagnosis not present

## 2021-04-08 DIAGNOSIS — F32A Depression, unspecified: Secondary | ICD-10-CM | POA: Diagnosis not present

## 2021-04-08 DIAGNOSIS — M810 Age-related osteoporosis without current pathological fracture: Secondary | ICD-10-CM | POA: Diagnosis not present

## 2021-04-08 DIAGNOSIS — G894 Chronic pain syndrome: Secondary | ICD-10-CM | POA: Diagnosis not present

## 2021-04-08 DIAGNOSIS — K219 Gastro-esophageal reflux disease without esophagitis: Secondary | ICD-10-CM | POA: Diagnosis not present

## 2021-04-09 DIAGNOSIS — G894 Chronic pain syndrome: Secondary | ICD-10-CM | POA: Diagnosis not present

## 2021-04-09 DIAGNOSIS — Z8701 Personal history of pneumonia (recurrent): Secondary | ICD-10-CM | POA: Diagnosis not present

## 2021-04-09 DIAGNOSIS — F32A Depression, unspecified: Secondary | ICD-10-CM | POA: Diagnosis not present

## 2021-04-09 DIAGNOSIS — Z515 Encounter for palliative care: Secondary | ICD-10-CM | POA: Diagnosis not present

## 2021-04-09 DIAGNOSIS — M810 Age-related osteoporosis without current pathological fracture: Secondary | ICD-10-CM | POA: Diagnosis not present

## 2021-04-09 DIAGNOSIS — J441 Chronic obstructive pulmonary disease with (acute) exacerbation: Secondary | ICD-10-CM | POA: Diagnosis not present

## 2021-04-09 DIAGNOSIS — F419 Anxiety disorder, unspecified: Secondary | ICD-10-CM | POA: Diagnosis not present

## 2021-04-09 DIAGNOSIS — K219 Gastro-esophageal reflux disease without esophagitis: Secondary | ICD-10-CM | POA: Diagnosis not present

## 2021-04-13 DIAGNOSIS — K219 Gastro-esophageal reflux disease without esophagitis: Secondary | ICD-10-CM | POA: Diagnosis not present

## 2021-04-13 DIAGNOSIS — F419 Anxiety disorder, unspecified: Secondary | ICD-10-CM | POA: Diagnosis not present

## 2021-04-13 DIAGNOSIS — Z20828 Contact with and (suspected) exposure to other viral communicable diseases: Secondary | ICD-10-CM | POA: Diagnosis not present

## 2021-04-13 DIAGNOSIS — M810 Age-related osteoporosis without current pathological fracture: Secondary | ICD-10-CM | POA: Diagnosis not present

## 2021-04-13 DIAGNOSIS — F32A Depression, unspecified: Secondary | ICD-10-CM | POA: Diagnosis not present

## 2021-04-13 DIAGNOSIS — G894 Chronic pain syndrome: Secondary | ICD-10-CM | POA: Diagnosis not present

## 2021-04-13 DIAGNOSIS — Z1159 Encounter for screening for other viral diseases: Secondary | ICD-10-CM | POA: Diagnosis not present

## 2021-04-13 DIAGNOSIS — J441 Chronic obstructive pulmonary disease with (acute) exacerbation: Secondary | ICD-10-CM | POA: Diagnosis not present

## 2021-04-15 DIAGNOSIS — M810 Age-related osteoporosis without current pathological fracture: Secondary | ICD-10-CM | POA: Diagnosis not present

## 2021-04-15 DIAGNOSIS — G894 Chronic pain syndrome: Secondary | ICD-10-CM | POA: Diagnosis not present

## 2021-04-15 DIAGNOSIS — J441 Chronic obstructive pulmonary disease with (acute) exacerbation: Secondary | ICD-10-CM | POA: Diagnosis not present

## 2021-04-15 DIAGNOSIS — F32A Depression, unspecified: Secondary | ICD-10-CM | POA: Diagnosis not present

## 2021-04-15 DIAGNOSIS — K219 Gastro-esophageal reflux disease without esophagitis: Secondary | ICD-10-CM | POA: Diagnosis not present

## 2021-04-15 DIAGNOSIS — F419 Anxiety disorder, unspecified: Secondary | ICD-10-CM | POA: Diagnosis not present

## 2021-04-16 DIAGNOSIS — F419 Anxiety disorder, unspecified: Secondary | ICD-10-CM | POA: Diagnosis not present

## 2021-04-16 DIAGNOSIS — J441 Chronic obstructive pulmonary disease with (acute) exacerbation: Secondary | ICD-10-CM | POA: Diagnosis not present

## 2021-04-16 DIAGNOSIS — M810 Age-related osteoporosis without current pathological fracture: Secondary | ICD-10-CM | POA: Diagnosis not present

## 2021-04-16 DIAGNOSIS — F32A Depression, unspecified: Secondary | ICD-10-CM | POA: Diagnosis not present

## 2021-04-16 DIAGNOSIS — K219 Gastro-esophageal reflux disease without esophagitis: Secondary | ICD-10-CM | POA: Diagnosis not present

## 2021-04-16 DIAGNOSIS — G894 Chronic pain syndrome: Secondary | ICD-10-CM | POA: Diagnosis not present

## 2021-04-20 DIAGNOSIS — G894 Chronic pain syndrome: Secondary | ICD-10-CM | POA: Diagnosis not present

## 2021-04-20 DIAGNOSIS — K219 Gastro-esophageal reflux disease without esophagitis: Secondary | ICD-10-CM | POA: Diagnosis not present

## 2021-04-20 DIAGNOSIS — J441 Chronic obstructive pulmonary disease with (acute) exacerbation: Secondary | ICD-10-CM | POA: Diagnosis not present

## 2021-04-20 DIAGNOSIS — F419 Anxiety disorder, unspecified: Secondary | ICD-10-CM | POA: Diagnosis not present

## 2021-04-20 DIAGNOSIS — F32A Depression, unspecified: Secondary | ICD-10-CM | POA: Diagnosis not present

## 2021-04-20 DIAGNOSIS — M810 Age-related osteoporosis without current pathological fracture: Secondary | ICD-10-CM | POA: Diagnosis not present

## 2021-04-22 DIAGNOSIS — J441 Chronic obstructive pulmonary disease with (acute) exacerbation: Secondary | ICD-10-CM | POA: Diagnosis not present

## 2021-04-22 DIAGNOSIS — F32A Depression, unspecified: Secondary | ICD-10-CM | POA: Diagnosis not present

## 2021-04-22 DIAGNOSIS — G894 Chronic pain syndrome: Secondary | ICD-10-CM | POA: Diagnosis not present

## 2021-04-22 DIAGNOSIS — K219 Gastro-esophageal reflux disease without esophagitis: Secondary | ICD-10-CM | POA: Diagnosis not present

## 2021-04-22 DIAGNOSIS — M810 Age-related osteoporosis without current pathological fracture: Secondary | ICD-10-CM | POA: Diagnosis not present

## 2021-04-22 DIAGNOSIS — F419 Anxiety disorder, unspecified: Secondary | ICD-10-CM | POA: Diagnosis not present

## 2021-04-23 DIAGNOSIS — K219 Gastro-esophageal reflux disease without esophagitis: Secondary | ICD-10-CM | POA: Diagnosis not present

## 2021-04-23 DIAGNOSIS — M810 Age-related osteoporosis without current pathological fracture: Secondary | ICD-10-CM | POA: Diagnosis not present

## 2021-04-23 DIAGNOSIS — F419 Anxiety disorder, unspecified: Secondary | ICD-10-CM | POA: Diagnosis not present

## 2021-04-23 DIAGNOSIS — G894 Chronic pain syndrome: Secondary | ICD-10-CM | POA: Diagnosis not present

## 2021-04-23 DIAGNOSIS — F32A Depression, unspecified: Secondary | ICD-10-CM | POA: Diagnosis not present

## 2021-04-23 DIAGNOSIS — J441 Chronic obstructive pulmonary disease with (acute) exacerbation: Secondary | ICD-10-CM | POA: Diagnosis not present

## 2021-04-24 DIAGNOSIS — Z1159 Encounter for screening for other viral diseases: Secondary | ICD-10-CM | POA: Diagnosis not present

## 2021-04-24 DIAGNOSIS — Z20828 Contact with and (suspected) exposure to other viral communicable diseases: Secondary | ICD-10-CM | POA: Diagnosis not present

## 2021-04-27 DIAGNOSIS — M810 Age-related osteoporosis without current pathological fracture: Secondary | ICD-10-CM | POA: Diagnosis not present

## 2021-04-27 DIAGNOSIS — K219 Gastro-esophageal reflux disease without esophagitis: Secondary | ICD-10-CM | POA: Diagnosis not present

## 2021-04-27 DIAGNOSIS — J441 Chronic obstructive pulmonary disease with (acute) exacerbation: Secondary | ICD-10-CM | POA: Diagnosis not present

## 2021-04-27 DIAGNOSIS — G894 Chronic pain syndrome: Secondary | ICD-10-CM | POA: Diagnosis not present

## 2021-04-27 DIAGNOSIS — F32A Depression, unspecified: Secondary | ICD-10-CM | POA: Diagnosis not present

## 2021-04-27 DIAGNOSIS — F419 Anxiety disorder, unspecified: Secondary | ICD-10-CM | POA: Diagnosis not present

## 2021-04-29 DIAGNOSIS — J441 Chronic obstructive pulmonary disease with (acute) exacerbation: Secondary | ICD-10-CM | POA: Diagnosis not present

## 2021-04-29 DIAGNOSIS — G894 Chronic pain syndrome: Secondary | ICD-10-CM | POA: Diagnosis not present

## 2021-04-29 DIAGNOSIS — F32A Depression, unspecified: Secondary | ICD-10-CM | POA: Diagnosis not present

## 2021-04-29 DIAGNOSIS — M810 Age-related osteoporosis without current pathological fracture: Secondary | ICD-10-CM | POA: Diagnosis not present

## 2021-04-29 DIAGNOSIS — F419 Anxiety disorder, unspecified: Secondary | ICD-10-CM | POA: Diagnosis not present

## 2021-04-29 DIAGNOSIS — K219 Gastro-esophageal reflux disease without esophagitis: Secondary | ICD-10-CM | POA: Diagnosis not present

## 2021-04-30 DIAGNOSIS — G894 Chronic pain syndrome: Secondary | ICD-10-CM | POA: Diagnosis not present

## 2021-04-30 DIAGNOSIS — M810 Age-related osteoporosis without current pathological fracture: Secondary | ICD-10-CM | POA: Diagnosis not present

## 2021-04-30 DIAGNOSIS — J441 Chronic obstructive pulmonary disease with (acute) exacerbation: Secondary | ICD-10-CM | POA: Diagnosis not present

## 2021-04-30 DIAGNOSIS — K219 Gastro-esophageal reflux disease without esophagitis: Secondary | ICD-10-CM | POA: Diagnosis not present

## 2021-04-30 DIAGNOSIS — F419 Anxiety disorder, unspecified: Secondary | ICD-10-CM | POA: Diagnosis not present

## 2021-04-30 DIAGNOSIS — F32A Depression, unspecified: Secondary | ICD-10-CM | POA: Diagnosis not present

## 2021-05-04 DIAGNOSIS — Z20828 Contact with and (suspected) exposure to other viral communicable diseases: Secondary | ICD-10-CM | POA: Diagnosis not present

## 2021-05-04 DIAGNOSIS — Z1159 Encounter for screening for other viral diseases: Secondary | ICD-10-CM | POA: Diagnosis not present

## 2021-05-05 DIAGNOSIS — F419 Anxiety disorder, unspecified: Secondary | ICD-10-CM | POA: Diagnosis not present

## 2021-05-05 DIAGNOSIS — G894 Chronic pain syndrome: Secondary | ICD-10-CM | POA: Diagnosis not present

## 2021-05-05 DIAGNOSIS — M810 Age-related osteoporosis without current pathological fracture: Secondary | ICD-10-CM | POA: Diagnosis not present

## 2021-05-05 DIAGNOSIS — F32A Depression, unspecified: Secondary | ICD-10-CM | POA: Diagnosis not present

## 2021-05-05 DIAGNOSIS — J441 Chronic obstructive pulmonary disease with (acute) exacerbation: Secondary | ICD-10-CM | POA: Diagnosis not present

## 2021-05-05 DIAGNOSIS — K219 Gastro-esophageal reflux disease without esophagitis: Secondary | ICD-10-CM | POA: Diagnosis not present

## 2021-05-10 DIAGNOSIS — K219 Gastro-esophageal reflux disease without esophagitis: Secondary | ICD-10-CM | POA: Diagnosis not present

## 2021-05-10 DIAGNOSIS — M810 Age-related osteoporosis without current pathological fracture: Secondary | ICD-10-CM | POA: Diagnosis not present

## 2021-05-10 DIAGNOSIS — Z515 Encounter for palliative care: Secondary | ICD-10-CM | POA: Diagnosis not present

## 2021-05-10 DIAGNOSIS — F32A Depression, unspecified: Secondary | ICD-10-CM | POA: Diagnosis not present

## 2021-05-10 DIAGNOSIS — J441 Chronic obstructive pulmonary disease with (acute) exacerbation: Secondary | ICD-10-CM | POA: Diagnosis not present

## 2021-05-10 DIAGNOSIS — G894 Chronic pain syndrome: Secondary | ICD-10-CM | POA: Diagnosis not present

## 2021-05-10 DIAGNOSIS — F419 Anxiety disorder, unspecified: Secondary | ICD-10-CM | POA: Diagnosis not present

## 2021-05-10 DIAGNOSIS — Z8701 Personal history of pneumonia (recurrent): Secondary | ICD-10-CM | POA: Diagnosis not present

## 2021-05-11 DIAGNOSIS — K219 Gastro-esophageal reflux disease without esophagitis: Secondary | ICD-10-CM | POA: Diagnosis not present

## 2021-05-11 DIAGNOSIS — M810 Age-related osteoporosis without current pathological fracture: Secondary | ICD-10-CM | POA: Diagnosis not present

## 2021-05-11 DIAGNOSIS — F32A Depression, unspecified: Secondary | ICD-10-CM | POA: Diagnosis not present

## 2021-05-11 DIAGNOSIS — J441 Chronic obstructive pulmonary disease with (acute) exacerbation: Secondary | ICD-10-CM | POA: Diagnosis not present

## 2021-05-11 DIAGNOSIS — F419 Anxiety disorder, unspecified: Secondary | ICD-10-CM | POA: Diagnosis not present

## 2021-05-11 DIAGNOSIS — G894 Chronic pain syndrome: Secondary | ICD-10-CM | POA: Diagnosis not present

## 2021-05-11 DIAGNOSIS — Z20828 Contact with and (suspected) exposure to other viral communicable diseases: Secondary | ICD-10-CM | POA: Diagnosis not present

## 2021-05-12 DIAGNOSIS — F419 Anxiety disorder, unspecified: Secondary | ICD-10-CM | POA: Diagnosis not present

## 2021-05-12 DIAGNOSIS — F32A Depression, unspecified: Secondary | ICD-10-CM | POA: Diagnosis not present

## 2021-05-12 DIAGNOSIS — G894 Chronic pain syndrome: Secondary | ICD-10-CM | POA: Diagnosis not present

## 2021-05-12 DIAGNOSIS — M810 Age-related osteoporosis without current pathological fracture: Secondary | ICD-10-CM | POA: Diagnosis not present

## 2021-05-12 DIAGNOSIS — K219 Gastro-esophageal reflux disease without esophagitis: Secondary | ICD-10-CM | POA: Diagnosis not present

## 2021-05-12 DIAGNOSIS — J441 Chronic obstructive pulmonary disease with (acute) exacerbation: Secondary | ICD-10-CM | POA: Diagnosis not present

## 2021-05-13 DIAGNOSIS — Z20828 Contact with and (suspected) exposure to other viral communicable diseases: Secondary | ICD-10-CM | POA: Diagnosis not present

## 2021-05-13 DIAGNOSIS — G894 Chronic pain syndrome: Secondary | ICD-10-CM | POA: Diagnosis not present

## 2021-05-13 DIAGNOSIS — Z1159 Encounter for screening for other viral diseases: Secondary | ICD-10-CM | POA: Diagnosis not present

## 2021-05-13 DIAGNOSIS — F419 Anxiety disorder, unspecified: Secondary | ICD-10-CM | POA: Diagnosis not present

## 2021-05-13 DIAGNOSIS — M810 Age-related osteoporosis without current pathological fracture: Secondary | ICD-10-CM | POA: Diagnosis not present

## 2021-05-13 DIAGNOSIS — F32A Depression, unspecified: Secondary | ICD-10-CM | POA: Diagnosis not present

## 2021-05-13 DIAGNOSIS — J441 Chronic obstructive pulmonary disease with (acute) exacerbation: Secondary | ICD-10-CM | POA: Diagnosis not present

## 2021-05-13 DIAGNOSIS — K219 Gastro-esophageal reflux disease without esophagitis: Secondary | ICD-10-CM | POA: Diagnosis not present

## 2021-05-14 DIAGNOSIS — J441 Chronic obstructive pulmonary disease with (acute) exacerbation: Secondary | ICD-10-CM | POA: Diagnosis not present

## 2021-05-14 DIAGNOSIS — F32A Depression, unspecified: Secondary | ICD-10-CM | POA: Diagnosis not present

## 2021-05-14 DIAGNOSIS — M810 Age-related osteoporosis without current pathological fracture: Secondary | ICD-10-CM | POA: Diagnosis not present

## 2021-05-14 DIAGNOSIS — F419 Anxiety disorder, unspecified: Secondary | ICD-10-CM | POA: Diagnosis not present

## 2021-05-14 DIAGNOSIS — K219 Gastro-esophageal reflux disease without esophagitis: Secondary | ICD-10-CM | POA: Diagnosis not present

## 2021-05-14 DIAGNOSIS — G894 Chronic pain syndrome: Secondary | ICD-10-CM | POA: Diagnosis not present

## 2021-05-15 DIAGNOSIS — K219 Gastro-esophageal reflux disease without esophagitis: Secondary | ICD-10-CM | POA: Diagnosis not present

## 2021-05-15 DIAGNOSIS — M810 Age-related osteoporosis without current pathological fracture: Secondary | ICD-10-CM | POA: Diagnosis not present

## 2021-05-15 DIAGNOSIS — F419 Anxiety disorder, unspecified: Secondary | ICD-10-CM | POA: Diagnosis not present

## 2021-05-15 DIAGNOSIS — J441 Chronic obstructive pulmonary disease with (acute) exacerbation: Secondary | ICD-10-CM | POA: Diagnosis not present

## 2021-05-15 DIAGNOSIS — F32A Depression, unspecified: Secondary | ICD-10-CM | POA: Diagnosis not present

## 2021-05-15 DIAGNOSIS — G894 Chronic pain syndrome: Secondary | ICD-10-CM | POA: Diagnosis not present

## 2021-05-18 DIAGNOSIS — G894 Chronic pain syndrome: Secondary | ICD-10-CM | POA: Diagnosis not present

## 2021-05-18 DIAGNOSIS — Z20828 Contact with and (suspected) exposure to other viral communicable diseases: Secondary | ICD-10-CM | POA: Diagnosis not present

## 2021-05-18 DIAGNOSIS — F419 Anxiety disorder, unspecified: Secondary | ICD-10-CM | POA: Diagnosis not present

## 2021-05-18 DIAGNOSIS — F32A Depression, unspecified: Secondary | ICD-10-CM | POA: Diagnosis not present

## 2021-05-18 DIAGNOSIS — M810 Age-related osteoporosis without current pathological fracture: Secondary | ICD-10-CM | POA: Diagnosis not present

## 2021-05-18 DIAGNOSIS — J441 Chronic obstructive pulmonary disease with (acute) exacerbation: Secondary | ICD-10-CM | POA: Diagnosis not present

## 2021-05-18 DIAGNOSIS — K219 Gastro-esophageal reflux disease without esophagitis: Secondary | ICD-10-CM | POA: Diagnosis not present

## 2021-05-20 DIAGNOSIS — M810 Age-related osteoporosis without current pathological fracture: Secondary | ICD-10-CM | POA: Diagnosis not present

## 2021-05-20 DIAGNOSIS — J441 Chronic obstructive pulmonary disease with (acute) exacerbation: Secondary | ICD-10-CM | POA: Diagnosis not present

## 2021-05-20 DIAGNOSIS — Z1159 Encounter for screening for other viral diseases: Secondary | ICD-10-CM | POA: Diagnosis not present

## 2021-05-20 DIAGNOSIS — K219 Gastro-esophageal reflux disease without esophagitis: Secondary | ICD-10-CM | POA: Diagnosis not present

## 2021-05-20 DIAGNOSIS — F419 Anxiety disorder, unspecified: Secondary | ICD-10-CM | POA: Diagnosis not present

## 2021-05-20 DIAGNOSIS — Z20828 Contact with and (suspected) exposure to other viral communicable diseases: Secondary | ICD-10-CM | POA: Diagnosis not present

## 2021-05-20 DIAGNOSIS — F32A Depression, unspecified: Secondary | ICD-10-CM | POA: Diagnosis not present

## 2021-05-20 DIAGNOSIS — G894 Chronic pain syndrome: Secondary | ICD-10-CM | POA: Diagnosis not present

## 2021-05-25 DIAGNOSIS — F419 Anxiety disorder, unspecified: Secondary | ICD-10-CM | POA: Diagnosis not present

## 2021-05-25 DIAGNOSIS — M810 Age-related osteoporosis without current pathological fracture: Secondary | ICD-10-CM | POA: Diagnosis not present

## 2021-05-25 DIAGNOSIS — Z20828 Contact with and (suspected) exposure to other viral communicable diseases: Secondary | ICD-10-CM | POA: Diagnosis not present

## 2021-05-25 DIAGNOSIS — F32A Depression, unspecified: Secondary | ICD-10-CM | POA: Diagnosis not present

## 2021-05-25 DIAGNOSIS — K219 Gastro-esophageal reflux disease without esophagitis: Secondary | ICD-10-CM | POA: Diagnosis not present

## 2021-05-25 DIAGNOSIS — G894 Chronic pain syndrome: Secondary | ICD-10-CM | POA: Diagnosis not present

## 2021-05-25 DIAGNOSIS — J441 Chronic obstructive pulmonary disease with (acute) exacerbation: Secondary | ICD-10-CM | POA: Diagnosis not present

## 2021-05-28 DIAGNOSIS — G894 Chronic pain syndrome: Secondary | ICD-10-CM | POA: Diagnosis not present

## 2021-05-28 DIAGNOSIS — K219 Gastro-esophageal reflux disease without esophagitis: Secondary | ICD-10-CM | POA: Diagnosis not present

## 2021-05-28 DIAGNOSIS — F32A Depression, unspecified: Secondary | ICD-10-CM | POA: Diagnosis not present

## 2021-05-28 DIAGNOSIS — F419 Anxiety disorder, unspecified: Secondary | ICD-10-CM | POA: Diagnosis not present

## 2021-05-28 DIAGNOSIS — M810 Age-related osteoporosis without current pathological fracture: Secondary | ICD-10-CM | POA: Diagnosis not present

## 2021-05-28 DIAGNOSIS — J441 Chronic obstructive pulmonary disease with (acute) exacerbation: Secondary | ICD-10-CM | POA: Diagnosis not present

## 2021-05-30 DIAGNOSIS — F32A Depression, unspecified: Secondary | ICD-10-CM | POA: Diagnosis not present

## 2021-05-30 DIAGNOSIS — F419 Anxiety disorder, unspecified: Secondary | ICD-10-CM | POA: Diagnosis not present

## 2021-05-30 DIAGNOSIS — K219 Gastro-esophageal reflux disease without esophagitis: Secondary | ICD-10-CM | POA: Diagnosis not present

## 2021-05-30 DIAGNOSIS — M810 Age-related osteoporosis without current pathological fracture: Secondary | ICD-10-CM | POA: Diagnosis not present

## 2021-05-30 DIAGNOSIS — G894 Chronic pain syndrome: Secondary | ICD-10-CM | POA: Diagnosis not present

## 2021-05-30 DIAGNOSIS — J441 Chronic obstructive pulmonary disease with (acute) exacerbation: Secondary | ICD-10-CM | POA: Diagnosis not present

## 2021-05-31 ENCOUNTER — Encounter (HOSPITAL_COMMUNITY): Payer: Self-pay | Admitting: Emergency Medicine

## 2021-05-31 ENCOUNTER — Other Ambulatory Visit: Payer: Self-pay

## 2021-05-31 ENCOUNTER — Inpatient Hospital Stay (HOSPITAL_COMMUNITY)
Admission: EM | Admit: 2021-05-31 | Discharge: 2021-06-05 | DRG: 177 | Disposition: A | Discharge status: Hospice | Payer: Medicare Other | Source: Skilled Nursing Facility | Attending: Internal Medicine | Admitting: Internal Medicine

## 2021-05-31 ENCOUNTER — Emergency Department (HOSPITAL_COMMUNITY): Payer: Medicare Other

## 2021-05-31 DIAGNOSIS — E785 Hyperlipidemia, unspecified: Secondary | ICD-10-CM | POA: Diagnosis present

## 2021-05-31 DIAGNOSIS — F0394 Unspecified dementia, unspecified severity, with anxiety: Secondary | ICD-10-CM | POA: Diagnosis present

## 2021-05-31 DIAGNOSIS — M810 Age-related osteoporosis without current pathological fracture: Secondary | ICD-10-CM | POA: Diagnosis present

## 2021-05-31 DIAGNOSIS — G8929 Other chronic pain: Secondary | ICD-10-CM | POA: Diagnosis present

## 2021-05-31 DIAGNOSIS — F419 Anxiety disorder, unspecified: Secondary | ICD-10-CM | POA: Diagnosis not present

## 2021-05-31 DIAGNOSIS — Z800 Family history of malignant neoplasm of digestive organs: Secondary | ICD-10-CM

## 2021-05-31 DIAGNOSIS — M353 Polymyalgia rheumatica: Secondary | ICD-10-CM | POA: Diagnosis present

## 2021-05-31 DIAGNOSIS — I10 Essential (primary) hypertension: Secondary | ICD-10-CM | POA: Diagnosis not present

## 2021-05-31 DIAGNOSIS — Z66 Do not resuscitate: Secondary | ICD-10-CM | POA: Diagnosis present

## 2021-05-31 DIAGNOSIS — R627 Adult failure to thrive: Secondary | ICD-10-CM | POA: Diagnosis present

## 2021-05-31 DIAGNOSIS — N179 Acute kidney failure, unspecified: Secondary | ICD-10-CM | POA: Diagnosis present

## 2021-05-31 DIAGNOSIS — Z8249 Family history of ischemic heart disease and other diseases of the circulatory system: Secondary | ICD-10-CM | POA: Diagnosis not present

## 2021-05-31 DIAGNOSIS — U071 COVID-19: Principal | ICD-10-CM | POA: Diagnosis present

## 2021-05-31 DIAGNOSIS — R0602 Shortness of breath: Secondary | ICD-10-CM | POA: Diagnosis not present

## 2021-05-31 DIAGNOSIS — D72828 Other elevated white blood cell count: Secondary | ICD-10-CM | POA: Diagnosis present

## 2021-05-31 DIAGNOSIS — Z515 Encounter for palliative care: Secondary | ICD-10-CM | POA: Diagnosis not present

## 2021-05-31 DIAGNOSIS — Z9981 Dependence on supplemental oxygen: Secondary | ICD-10-CM

## 2021-05-31 DIAGNOSIS — R404 Transient alteration of awareness: Secondary | ICD-10-CM | POA: Diagnosis not present

## 2021-05-31 DIAGNOSIS — J9621 Acute and chronic respiratory failure with hypoxia: Secondary | ICD-10-CM | POA: Diagnosis not present

## 2021-05-31 DIAGNOSIS — K219 Gastro-esophageal reflux disease without esophagitis: Secondary | ICD-10-CM | POA: Diagnosis not present

## 2021-05-31 DIAGNOSIS — R059 Cough, unspecified: Secondary | ICD-10-CM | POA: Diagnosis not present

## 2021-05-31 DIAGNOSIS — R918 Other nonspecific abnormal finding of lung field: Secondary | ICD-10-CM | POA: Diagnosis not present

## 2021-05-31 DIAGNOSIS — J441 Chronic obstructive pulmonary disease with (acute) exacerbation: Secondary | ICD-10-CM | POA: Diagnosis not present

## 2021-05-31 DIAGNOSIS — J1282 Pneumonia due to coronavirus disease 2019: Secondary | ICD-10-CM | POA: Diagnosis present

## 2021-05-31 DIAGNOSIS — J189 Pneumonia, unspecified organism: Secondary | ICD-10-CM | POA: Diagnosis not present

## 2021-05-31 DIAGNOSIS — R7989 Other specified abnormal findings of blood chemistry: Secondary | ICD-10-CM | POA: Diagnosis not present

## 2021-05-31 DIAGNOSIS — Z7189 Other specified counseling: Secondary | ICD-10-CM | POA: Diagnosis not present

## 2021-05-31 DIAGNOSIS — F32A Depression, unspecified: Secondary | ICD-10-CM | POA: Diagnosis not present

## 2021-05-31 DIAGNOSIS — R4182 Altered mental status, unspecified: Secondary | ICD-10-CM | POA: Diagnosis not present

## 2021-05-31 DIAGNOSIS — Z8673 Personal history of transient ischemic attack (TIA), and cerebral infarction without residual deficits: Secondary | ICD-10-CM | POA: Diagnosis not present

## 2021-05-31 DIAGNOSIS — Z87891 Personal history of nicotine dependence: Secondary | ICD-10-CM | POA: Diagnosis not present

## 2021-05-31 DIAGNOSIS — R0902 Hypoxemia: Secondary | ICD-10-CM | POA: Diagnosis not present

## 2021-05-31 DIAGNOSIS — F0393 Unspecified dementia, unspecified severity, with mood disturbance: Secondary | ICD-10-CM | POA: Diagnosis present

## 2021-05-31 DIAGNOSIS — Z79899 Other long term (current) drug therapy: Secondary | ICD-10-CM

## 2021-05-31 DIAGNOSIS — I517 Cardiomegaly: Secondary | ICD-10-CM | POA: Diagnosis not present

## 2021-05-31 DIAGNOSIS — Z743 Need for continuous supervision: Secondary | ICD-10-CM | POA: Diagnosis not present

## 2021-05-31 DIAGNOSIS — N1832 Chronic kidney disease, stage 3b: Secondary | ICD-10-CM | POA: Diagnosis present

## 2021-05-31 DIAGNOSIS — G894 Chronic pain syndrome: Secondary | ICD-10-CM | POA: Diagnosis not present

## 2021-05-31 DIAGNOSIS — F418 Other specified anxiety disorders: Secondary | ICD-10-CM | POA: Diagnosis not present

## 2021-05-31 LAB — COMPREHENSIVE METABOLIC PANEL
ALT: 15 U/L (ref 0–44)
AST: 24 U/L (ref 15–41)
Albumin: 3.3 g/dL — ABNORMAL LOW (ref 3.5–5.0)
Alkaline Phosphatase: 70 U/L (ref 38–126)
Anion gap: 10 (ref 5–15)
BUN: 20 mg/dL (ref 8–23)
CO2: 27 mmol/L (ref 22–32)
Calcium: 9 mg/dL (ref 8.9–10.3)
Chloride: 96 mmol/L — ABNORMAL LOW (ref 98–111)
Creatinine, Ser: 1.55 mg/dL — ABNORMAL HIGH (ref 0.44–1.00)
GFR, Estimated: 31 mL/min — ABNORMAL LOW (ref >60)
Glucose, Bld: 118 mg/dL — ABNORMAL HIGH (ref 70–99)
Potassium: 4.1 mmol/L (ref 3.5–5.1)
Sodium: 133 mmol/L — ABNORMAL LOW (ref 135–145)
Total Bilirubin: 0.4 mg/dL (ref 0.3–1.2)
Total Protein: 6.8 g/dL (ref 6.5–8.1)

## 2021-05-31 LAB — CBC WITH DIFFERENTIAL/PLATELET
Abs Immature Granulocytes: 0.04 10*3/uL (ref 0.00–0.07)
Basophils Absolute: 0 10*3/uL (ref 0.0–0.1)
Basophils Relative: 0 %
Eosinophils Absolute: 0 10*3/uL (ref 0.0–0.5)
Eosinophils Relative: 0 %
HCT: 42.4 % (ref 36.0–46.0)
Hemoglobin: 13.7 g/dL (ref 12.0–15.0)
Immature Granulocytes: 0 %
Lymphocytes Relative: 19 %
Lymphs Abs: 2.1 10*3/uL (ref 0.7–4.0)
MCH: 31 pg (ref 26.0–34.0)
MCHC: 32.3 g/dL (ref 30.0–36.0)
MCV: 95.9 fL (ref 80.0–100.0)
Monocytes Absolute: 0.8 10*3/uL (ref 0.1–1.0)
Monocytes Relative: 7 %
Neutro Abs: 7.8 10*3/uL — ABNORMAL HIGH (ref 1.7–7.7)
Neutrophils Relative %: 74 %
Platelets: 204 10*3/uL (ref 150–400)
RBC: 4.42 MIL/uL (ref 3.87–5.11)
RDW: 13.1 % (ref 11.5–15.5)
WBC: 10.8 10*3/uL — ABNORMAL HIGH (ref 4.0–10.5)
nRBC: 0 % (ref 0.0–0.2)

## 2021-05-31 LAB — LACTATE DEHYDROGENASE: LDH: 137 U/L (ref 98–192)

## 2021-05-31 LAB — PROCALCITONIN: Procalcitonin: 0.17 ng/mL

## 2021-05-31 LAB — FERRITIN: Ferritin: 494 ng/mL — ABNORMAL HIGH (ref 11–307)

## 2021-05-31 LAB — LACTIC ACID, PLASMA: Lactic Acid, Venous: 1.3 mmol/L (ref 0.5–1.9)

## 2021-05-31 LAB — RESP PANEL BY RT-PCR (FLU A&B, COVID) ARPGX2
Influenza A by PCR: NEGATIVE
Influenza B by PCR: NEGATIVE
SARS Coronavirus 2 by RT PCR: POSITIVE — AB

## 2021-05-31 LAB — FIBRINOGEN: Fibrinogen: 575 mg/dL — ABNORMAL HIGH (ref 210–475)

## 2021-05-31 LAB — D-DIMER, QUANTITATIVE: D-Dimer, Quant: 1.12 ug/mL-FEU — ABNORMAL HIGH (ref 0.00–0.50)

## 2021-05-31 LAB — TRIGLYCERIDES: Triglycerides: 113 mg/dL (ref <150)

## 2021-05-31 MED ORDER — HYDROCODONE-ACETAMINOPHEN 5-325 MG PO TABS
1.0000 | ORAL_TABLET | ORAL | Status: DC | PRN
  Administered 2021-06-01: 1 via ORAL
  Filled 2021-05-31: qty 1

## 2021-05-31 MED ORDER — TRAZODONE HCL 50 MG PO TABS
25.0000 mg | ORAL_TABLET | Freq: Every evening | ORAL | Status: DC | PRN
  Administered 2021-06-01 – 2021-06-03 (×2): 25 mg via ORAL
  Filled 2021-05-31 (×2): qty 1

## 2021-05-31 MED ORDER — METHYLPREDNISOLONE SODIUM SUCC 40 MG IJ SOLR
0.5000 mg/kg | Freq: Two times a day (BID) | INTRAMUSCULAR | Status: AC
  Administered 2021-05-31 – 2021-06-03 (×6): 24 mg via INTRAVENOUS
  Filled 2021-05-31 (×6): qty 1

## 2021-05-31 MED ORDER — LATANOPROST 0.005 % OP SOLN
1.0000 [drp] | Freq: Every day | OPHTHALMIC | Status: DC
  Administered 2021-06-01 – 2021-06-04 (×4): 1 [drp] via OPHTHALMIC
  Filled 2021-05-31 (×2): qty 2.5

## 2021-05-31 MED ORDER — PREDNISONE 50 MG PO TABS
50.0000 mg | ORAL_TABLET | Freq: Every day | ORAL | Status: DC
  Administered 2021-06-04 – 2021-06-05 (×2): 50 mg via ORAL
  Filled 2021-05-31 (×3): qty 1

## 2021-05-31 MED ORDER — ALBUTEROL SULFATE HFA 108 (90 BASE) MCG/ACT IN AERS
2.0000 | INHALATION_SPRAY | Freq: Four times a day (QID) | RESPIRATORY_TRACT | Status: DC
  Administered 2021-06-01 – 2021-06-05 (×16): 2 via RESPIRATORY_TRACT
  Filled 2021-05-31 (×2): qty 6.7

## 2021-05-31 MED ORDER — ENOXAPARIN SODIUM 30 MG/0.3ML IJ SOSY
30.0000 mg | PREFILLED_SYRINGE | INTRAMUSCULAR | Status: DC
  Administered 2021-06-01 – 2021-06-03 (×3): 30 mg via SUBCUTANEOUS
  Filled 2021-05-31 (×3): qty 0.3

## 2021-05-31 MED ORDER — SODIUM CHLORIDE 0.9 % IV SOLN
75.0000 mL/h | INTRAVENOUS | Status: AC
  Administered 2021-05-31 (×2): 75 mL/h via INTRAVENOUS

## 2021-05-31 MED ORDER — SODIUM CHLORIDE 0.9 % IV SOLN
200.0000 mg | Freq: Once | INTRAVENOUS | Status: AC
  Administered 2021-05-31: 200 mg via INTRAVENOUS
  Filled 2021-05-31: qty 40

## 2021-05-31 MED ORDER — ACETAMINOPHEN 325 MG PO TABS
650.0000 mg | ORAL_TABLET | Freq: Four times a day (QID) | ORAL | Status: DC | PRN
  Administered 2021-06-01 – 2021-06-05 (×6): 650 mg via ORAL
  Filled 2021-05-31 (×6): qty 2

## 2021-05-31 MED ORDER — ACETAMINOPHEN 650 MG RE SUPP: 650.0000 mg | Freq: Four times a day (QID) | RECTAL | Status: DC | PRN

## 2021-05-31 MED ORDER — SODIUM CHLORIDE 0.9 % IV SOLN
100.0000 mg | Freq: Every day | INTRAVENOUS | Status: AC
  Administered 2021-06-01 – 2021-06-04 (×4): 100 mg via INTRAVENOUS
  Filled 2021-05-31 (×4): qty 20

## 2021-05-31 MED ORDER — DORZOLAMIDE HCL 2 % OP SOLN
1.0000 [drp] | Freq: Two times a day (BID) | OPHTHALMIC | Status: DC
  Administered 2021-06-01 – 2021-06-05 (×8): 1 [drp] via OPHTHALMIC
  Filled 2021-05-31 (×2): qty 10

## 2021-05-31 NOTE — ED Notes (Signed)
[phone], Meda Klinefelter, patients son. [place], patients son.

## 2021-05-31 NOTE — ED Triage Notes (Signed)
Per XAJOI pt coming from [place] at [facility] living. Called by caregiver reporting AMS and hypoxia. States patient is usually alert and talking but yesterday and today lethargic and stiff as a board. On 2L [place] at baseline and was sating in 82s. Caregiver attempted to bump oxygen up but could not improve sats. Placed on non rebreather and up to 98%. 200CC of LR given en route. Covid + on Thursday.

## 2021-05-31 NOTE — Assessment & Plan Note (Signed)
Resume when able to tolerate

## 2021-05-31 NOTE — Subjective & Objective (Signed)
Coming from [place] at [facility] living. Patient found confused and hypoxic at her baseline she is alert and able to communicate.  With her past today been extra lethargic.  On 2 L her baseline and was satting in the 80s.  Caregiver try to improve her oxygenation by bumping up her oxygen but did not seem to help.  On EMS arrival required nonrebreather recently tested positive for COVID

## 2021-05-31 NOTE — ED Notes (Signed)
Son, [name] called to check on mom and ask that someone call him with an update [phone].

## 2021-05-31 NOTE — ED Provider Notes (Signed)
[facility] EMERGENCY DEPARTMENT Provider Note    CSN: [id number] Arrival date & time: 05/31/21 1740  History Chief Complaint  Patient presents with   Shortness of Breath   Altered Mental Status    Katrina Burnett is a 88 y.o. female with history of dementia, chronic respiratory failure on oxygen at ALF noted to have positive Covid test 4 days ago, has had increasing oxygen requirements (SpO2 80s at ALF, not improved with increased nasal canula and now on NRB) and more confusion than usual. No reported fever. Patient unable to provide any history. Spoke with Daughter who is HCPOA who confirms above history, states she saw her a couple of days prior to Covid diagnosis and she was in her usual state of health. She confirms prior DNR/DNI status but would like other interventions as indicated.    Home Medications Prior to Admission medications   Medication Sig Start Date End Date Taking? Authorizing Provider  citalopram (CELEXA) 40 MG tablet Take 1 tablet (40 mg total) by mouth daily. 12/02/20   [place] Eric J, DO  dorzolamide (TRUSOPT) 2 % ophthalmic solution Place 1 drop into both eyes 2 (two) times daily.     [provider]  ferrous sulfate 325 (65 FE) MG tablet Take 1 tablet (325 mg total) by mouth 2 (two) times daily with a meal. 01/27/20   Ghimire, Henreitta Leber, MD  furosemide (LASIX) 20 MG tablet Take 1 tablet (20 mg total) by mouth daily. 12/03/20   [place] Donnamarie Poag, DO  gabapentin (NEURONTIN) 300 MG capsule Take 1 capsule (300 mg total) by mouth at bedtime. 12/02/20 01/01/21  [place] Donnamarie Poag, DO  latanoprost (XALATAN) 0.005 % ophthalmic solution Place 1 drop into both eyes at bedtime.    [provider]  linaclotide (LINZESS) 145 MCG CAPS capsule Take 145 mcg by mouth daily before breakfast.    [provider]  LORazepam (ATIVAN) 2 MG tablet Take 0.5 tablets (1 mg total) by mouth 3 (three) times daily. 12/02/20   [place] Eric J, DO  Multiple Vitamin  (MULTIVITAMIN) tablet Take 1 tablet by mouth at bedtime.     [provider]  oxyCODONE-acetaminophen (PERCOCET) 10-325 MG tablet Take 1 tablet by mouth every 6 (six) hours as needed for pain. 12/02/20   [place] Eric J, DO  pantoprazole (PROTONIX) 40 MG tablet Take 1 tablet (40 mg total) by mouth daily. 11/23/19 11/27/20  ShahmehdiValeria Batman, MD  Probiotic Product (ALIGN) 4 MG CAPS Take 4 mg by mouth at bedtime.     [provider]  traZODone (DESYREL) 50 MG tablet Take 0.5 tablets (25 mg total) by mouth at bedtime as needed for sleep. 12/02/20 01/01/21  [place] Eric J, DO     Allergies    Minocycline, Paxil [paroxetine hcl], Sulfa antibiotics, Amoxicillin, Bupropion hcl er (sr), Cyclobenzaprine, Mirtazapine, Vilazodone hcl, Avelox [moxifloxacin hcl in nacl], Cefuroxime, Clarithromycin, Levaquin [levofloxacin in d5w], Nystatin-triamcinolone, Penicillins, and Prednisone   Review of Systems   Review of Systems Please see HPI for pertinent positives and negatives  Physical Exam BP 105/76    Pulse 72    Temp 99.3 F (37.4 C) (Rectal)    Resp (!) 22    Ht 5' (1.524 m)    Wt 48.1 kg    SpO2 92%    BMI 20.70 kg/m   Physical Exam Vitals and nursing note reviewed.  Constitutional:      Appearance: Normal appearance.     Comments: Somnolent but arouses with  verbal stimuli  HENT:     Head: Normocephalic and atraumatic.     Nose: Nose normal.     Mouth/Throat:     Mouth: Mucous membranes are moist.  Eyes:     Extraocular Movements: Extraocular movements intact.     Conjunctiva/sclera: Conjunctivae normal.  Cardiovascular:     Rate and Rhythm: Normal rate.  Pulmonary:     Breath sounds: Decreased breath sounds and rhonchi present.  Abdominal:     General: Abdomen is flat.     Palpations: Abdomen is soft.     Tenderness: There is no abdominal tenderness.  Musculoskeletal:        General: No swelling. Normal range of motion.     Cervical back: Neck supple.  Skin:     General: Skin is warm and dry.  Neurological:     Mental Status: She is disoriented.     Comments: Follows commands, moves all extremities, answers some yes/no questions.   Psychiatric:        Mood and Affect: Mood normal.    ED Results / Procedures / Treatments   EKG EKG Interpretation  Date/Time:  Sunday May 31 2021 17:54:31 EST Ventricular Rate:  77 PR Interval:  139 QRS Duration: 85 QT Interval:  376 QTC Calculation: 426 R Axis:   -18 Text Interpretation: Sinus rhythm LVH with secondary repolarization abnormality Anterior Q waves, possibly due to LVH No significant change since last tracing Confirmed by Calvert Cantor [phone]) on 05/31/2021 6:39:02 PM  Procedures .Critical Care Performed by: Truddie Hidden, MD Authorized by: Truddie Hidden, MD   Critical care provider statement:    Critical care time (minutes):  45   Critical care time was exclusive of:  Separately billable procedures and treating other patients   Critical care was necessary to treat or prevent imminent or life-threatening deterioration of the following conditions:  Respiratory failure and renal failure   Critical care was time spent personally by me on the following activities:  Development of treatment plan with patient or surrogate, discussions with consultants, evaluation of patient's response to treatment, examination of patient, ordering and review of laboratory studies, ordering and review of radiographic studies, ordering and performing treatments and interventions, pulse oximetry, re-evaluation of patient's condition and review of old charts   Care discussed with: admitting provider    Medications Ordered in the ED Medications - No data to display  Initial Impression and Plan  Patient with positive Covid test at ALF, here with acute on chronic respiratory failure. Will check labs, CXR, initiate the Covid admission order set.   ED Course   Clinical Course as of 05/31/21 2044  Nancy Fetter May 31, 2021  1839 WBC with mild leukocytosis, otherwise unremarkable.  [CS]  9518 CXR with RLL infiltrate, similar to previous, acute infection vs scar.  [CS]  1916 CMP shows AKI, otherwise unremarkable. Dimer elevated, consistent with Covid.  [CS]  1916 Lactic acid is neg.  [CS]  2000 Covid is confirmed positive. Patient has been transitioned to HFNC with some continued borderline SpO2 but not below 90%. Procalcitonin is neg making bacterial infection less likely.  [CS]  2005 Hospitalist paged for admission [CS]  2042 Spoke with Dr. Roel Cluck Hospitalist, who will evaluate for admission [CS]    Clinical Course User Index [CS] Truddie Hidden, MD     MDM Rules/Calculators/A&P Medical Decision Making Problems Addressed: Acute on chronic respiratory failure with hypoxia [facility]): acute illness or injury that poses a threat to life  or bodily functions AKI (acute kidney injury) [facility]): acute illness or injury that poses a threat to life or bodily functions COVID-19: acute illness or injury that poses a threat to life or bodily functions  Amount and/or Complexity of Data Reviewed Labs: ordered. Decision-making details documented in ED Course. Radiology: ordered and independent interpretation performed. Decision-making details documented in ED Course. ECG/medicine tests: ordered and independent interpretation performed. Decision-making details documented in ED Course.  Risk Decision regarding hospitalization.    Final Clinical Impression(s) / ED Diagnoses Final diagnoses:  COVID-19  Acute on chronic respiratory failure with hypoxia ([place])  AKI (acute kidney injury) [facility])    Rx / DC Orders ED Discharge Orders     None        Truddie Hidden, MD 05/31/21 2044

## 2021-05-31 NOTE — Assessment & Plan Note (Signed)
this patient has acute respiratory failure with Hypoxia as documented by the presence of following: O2 saturatio< 90% on RA  Likely due to:   COVID pneumonia Provide O2 therapy and titrate as needed  Continuous pulse ox   check Pulse ox with ambulation prior to discharge   may need  TC consult for home O2 set up   Prone if able  flutter valve ordered

## 2021-05-31 NOTE — Assessment & Plan Note (Signed)
FROM SNF WITH KNOWN HX OF COVID19   Immunization status vaccinated    Following concerning LAB/ imaging findings:  COVID-19 Labs  Recent Labs    05/31/21 1820  DDIMER 1.12*  FERRITIN 494*  LDH 137   Hepatic Function Latest Ref Rng & Units 05/31/2021 11/29/2020 11/28/2020  Total Protein 6.5 - 8.1 g/dL 6.8 6.6 5.2(L)  Albumin 3.5 - 5.0 g/dL 3.3(L) 3.3(L) 2.5(L)  AST 15 - 41 U/L 24 32 23  ALT 0 - 44 U/L 15 18 13   Alk Phosphatase 38 - 126 U/L 70 75 48  Total Bilirubin 0.3 - 1.2 mg/dL 0.4 0.4 0.4   Lab Results  Component Value Date   CREATININE 1.55 (H) 05/31/2021   CREATININE 0.73 12/02/2020   CBC    Component Value Date/Time   WBC 10.8 (H) 05/31/2021 1820   PLT 204 05/31/2021 1820   LYMPHSABS 2.1 05/31/2021 1820    Lab Results  Component Value Date   SARSCOV2NAA POSITIVE (A) 05/31/2021   [place] NEGATIVE 11/27/2020   [place] NEGATIVE 09/09/2020   [place] NEGATIVE 01/24/2020    Following complications noted:   evidence of AKI - will provide gentle rehydration   acute respiratory failure with hypoxia - continue oxygen treatment  Plan of treatment: Admit on Airborn Precautions  -given severity of illness initiate steroids And pharmacy consult for remdesivir    - Will follow daily d.dimer - Assess for ability to prone  - Supportive management -Fluid sparing resuscitation  -Provide oxygen as needed currently on   SpO2: 94 % O2 Flow Rate (L/min): 12 L/min - IF d.dimer elvated >5 will increase dose of lovenox   Poor Prognostic factors  88 y.o.   Evidence of  organ damage  presen  AKI, respiratory failure requiring >4L [place]  tachypnea,  present on admission  ABS neutrophil to lymphocyte ratio >3.5 Risk factors for Cytokine storm Ferritin >250         Will order Airborne and Contact precautions  Family/ patient prognosis discussion: I have discussed case with the family/ patient  who are aware of their prognosis At this point they would like DNR/DNI       The treatment plan and use of medications and known side effects were discussed with patient/ family. It was clearly explained that there is no proven definitive treatment for COVID-19 infection yet. Any medications used here are based on case reports/anecdotal data which are not peer-reviewed and has not been studied using randomized control trials.  Complete risks and long-term side effects are unknown, however in the best clinical judgment they seem to be of some clinical benefit rather than medical risks.  family agree with the treatment plan and want to receive these treatments as indicated.

## 2021-05-31 NOTE — H&P (Signed)
[name] VFI:[id number] DOB: 21-May-1924 DOA: 05/31/2021     PCP: [name], DO   Outpatient Specialists:  NONE    Patient arrived to ER on 05/31/21 at 1740 Referred by Attending Truddie Hidden, MD   Patient coming from:   From facility [facility]  Chief Complaint:   Chief Complaint  Patient presents with   Shortness of Breath   Altered Mental Status    HPI: Katrina Burnett is a 88 y.o. female with medical history significant of Chronic hypoxia on 2L , dementia, GERD, Anxiety, CKD stage 3, HLD    Presented with Shortness of breath, confusion Coming from [place] at [facility] living. Patient found confused and hypoxic at her baseline she is alert and able to communicate.  With her past today been extra lethargic.  On 2 L her baseline and was satting in the 80s.  Caregiver try to improve her oxygenation by bumping up her oxygen but did not seem to help.  On EMS arrival required nonrebreather recently tested positive for COVID     Has  been vaccinated against COVID and boosted had     Initial COVID TEST    POSITIVE,     Lab Results  Component Value Date   [place] (A) 05/31/2021   [place] NEGATIVE 11/27/2020   [place] NEGATIVE 09/09/2020   [place] NEGATIVE 01/24/2020     Regarding pertinent Chronic problems:      GERD - protonix    Dementia - on neurontin    Chronic anemia - baseline hg Hemoglobin & Hematocrit  Recent Labs    11/28/20 0515 11/29/20 0530 05/31/21 1820  HGB 10.3* 11.7* 13.7     While in ER: Clinical Course as of 05/31/21 2045  Sun May 31, 2021  1839 WBC with mild leukocytosis, otherwise unremarkable.  [CS]  4166 CXR with RLL infiltrate, similar to previous, acute infection vs scar.  [CS]  1916 CMP shows AKI, otherwise unremarkable. Dimer elevated, consistent with Covid.  [CS]  1916 Lactic acid is neg.  [CS]  2000 Covid is confirmed positive. Patient has been transitioned to HFNC with some  continued borderline SpO2 but not below 90%. Procalcitonin is neg making bacterial infection less likely.  [CS]  2005 Hospitalist paged for admission [CS]  2042 Spoke with Dr. Roel Cluck Hospitalist, who will evaluate for admission [CS]    Clinical Course User Index [CS] Truddie Hidden, MD         CXR - Patchy opacity in the right lower lobe, similar to prior study. This could reflect recurrent infiltrate or residual scarring.   Cardiomegaly.      Following Medications were ordered in ER: Medications - No data to display  _________  ED Triage Vitals  Enc Vitals Group     BP 05/31/21 1756 126/72     Pulse Rate 05/31/21 1753 74     Resp 05/31/21 1753 12     Temp 05/31/21 1753 98.1 F (36.7 C)     Temp Source 05/31/21 1753 Oral     SpO2 05/31/21 1752 98 %     Weight 05/31/21 1757 106 lb (48.1 kg)     Height 05/31/21 1757 5' (1.524 m)     Head Circumference --      Peak Flow --      Pain Score --      Pain Loc --      Pain Edu? --      Excl. in [place]? --  JYNW(29)@     _________________________________________ Significant initial  Findings: Abnormal Labs Reviewed  RESP PANEL BY RT-PCR (FLU A&B, COVID) ARPGX2 - Abnormal; Notable for the following components:      Result Value   SARS Coronavirus 2 by RT PCR POSITIVE (*)    All other components within normal limits  CBC WITH DIFFERENTIAL/PLATELET - Abnormal; Notable for the following components:   WBC 10.8 (*)    Neutro Abs 7.8 (*)    All other components within normal limits  COMPREHENSIVE METABOLIC PANEL - Abnormal; Notable for the following components:   Sodium 133 (*)    Chloride 96 (*)    Glucose, Bld 118 (*)    Creatinine, Ser 1.55 (*)    Albumin 3.3 (*)    GFR, Estimated 31 (*)    All other components within normal limits  D-DIMER, QUANTITATIVE - Abnormal; Notable for the following components:   D-Dimer, Quant 1.12 (*)    All other components within normal limits  FERRITIN - Abnormal; Notable for the  following components:   Ferritin 494 (*)    All other components within normal limits  FIBRINOGEN - Abnormal; Notable for the following components:   Fibrinogen 575 (*)    All other components within normal limits     _________________________ Troponin  ordered ECG: Ordered Personally reviewed by me showing: HR : 77 Rhythm:  NSR   no evidence of ischemic changes QTC 426    The recent clinical data is shown below. Vitals:   05/31/21 1821 05/31/21 1830 05/31/21 1900 05/31/21 1930  BP:  116/70 121/73 105/76  Pulse: 72 76 76 72  Resp: 17 15 12  (!) 22  Temp: 99.3 F (37.4 C)     TempSrc: Rectal     SpO2: 95% 93% 92% 92%  Weight:      Height:         WBC     Component Value Date/Time   WBC 10.8 (H) 05/31/2021 1820   LYMPHSABS 2.1 05/31/2021 1820   MONOABS 0.8 05/31/2021 1820   EOSABS 0.0 05/31/2021 1820   BASOSABS 0.0 05/31/2021 1820     Procalcitonin   0.17    UA   ordered   Urine analysis:    Component Value Date/Time   COLORURINE YELLOW 11/27/2020 1848   APPEARANCEUR HAZY (A) 11/27/2020 1848   LABSPEC 1.023 11/27/2020 1848   PHURINE 5.0 11/27/2020 1848   GLUCOSEU NEGATIVE 11/27/2020 1848   HGBUR NEGATIVE 11/27/2020 1848   BILIRUBINUR NEGATIVE 11/27/2020 1848   KETONESUR 5 (A) 11/27/2020 1848   PROTEINUR 30 (A) 11/27/2020 1848   UROBILINOGEN 0.2 05/24/2013 1634   NITRITE NEGATIVE 11/27/2020 1848   LEUKOCYTESUR NEGATIVE 11/27/2020 1848    Results for orders placed or performed during the hospital encounter of 05/31/21  Resp Panel by RT-PCR (Flu A&B, Covid) Nasopharyngeal Swab     Status: Abnormal   Collection Time: 05/31/21  6:20 PM   Specimen: Nasopharyngeal Swab; Nasopharyngeal(NP) swabs in vial transport medium  Result Value Ref Range Status   SARS Coronavirus 2 by RT PCR POSITIVE (A) NEGATIVE Final         Influenza A by PCR NEGATIVE NEGATIVE Final   Influenza B by PCR NEGATIVE NEGATIVE Final           _______________________________________________ Hospitalist was called for admission for COVID infection  The following Work up has been ordered so far:  Orders Placed This Encounter  Procedures   Resp Panel by RT-PCR (Flu A&B, Covid)  Nasopharyngeal Swab   Blood Culture (routine x 2)   DG Chest Port 1 View   CBC WITH DIFFERENTIAL   Comprehensive metabolic panel   D-dimer, quantitative   Procalcitonin   Lactate dehydrogenase   Ferritin   Triglycerides   Fibrinogen   C-reactive protein   Diet NPO time specified   Cardiac monitoring   Insert peripheral IV x 2   Initiate Carrier Fluid Protocol   Place surgical mask on patient   Patient to wear surgical mask during transportation   Assess patient for ability to self-prone. If able (can move self in bed, ambulate) and stable (SpO2 and oxygen requirement):   RN/NT - Document specific oxygen requirements in CHL   Notify EDP if new oxygen requirements escalates > 4L per minute [place]   RN to draw the following extra tubes:   Limited resuscitation (code)   Consult to hospitalist   Airborne and Contact precautions   Pulse oximetry, continuous   ED EKG 12-Lead     OTHER Significant initial  Findings:  labs showing:  Recent Labs  Lab 05/31/21 1820  NA 133*  K 4.1  CO2 27  GLUCOSE 118*  BUN 20  CREATININE 1.55*  CALCIUM 9.0    Cr    Up from baseline see below Lab Results  Component Value Date   CREATININE 1.55 (H) 05/31/2021   CREATININE 0.73 12/02/2020   CREATININE 0.65 12/01/2020    Recent Labs  Lab 05/31/21 1820  AST 24  ALT 15  ALKPHOS 70  BILITOT 0.4  PROT 6.8  ALBUMIN 3.3*   Lab Results  Component Value Date   CALCIUM 9.0 05/31/2021   PHOS 3.1 01/23/2020    Plt: Lab Results  Component Value Date   PLT 204 05/31/2021     COVID-19 Labs  Recent Labs    05/31/21 1820  DDIMER 1.12*  FERRITIN 494*  LDH 137    Lab Results  Component Value Date   SARSCOV2NAA POSITIVE (A) 05/31/2021    SARSCOV2NAA NEGATIVE 11/27/2020   SARSCOV2NAA NEGATIVE 09/09/2020   [place] NEGATIVE 01/24/2020        Recent Labs  Lab 05/31/21 1820  WBC 10.8*  NEUTROABS 7.8*  HGB 13.7  HCT 42.4  MCV 95.9  PLT 204    HG/HCT   stable,      Component Value Date/Time   HGB 13.7 05/31/2021 1820   HCT 42.4 05/31/2021 1820   MCV 95.9 05/31/2021 1820       .car BNP (last 3 results) Recent Labs    09/09/20 0800 11/28/20 1638  BNP 222.2* 983.6*        Cultures:    Component Value Date/Time   SDES  11/27/2020 1848    IN/OUT CATH URINE Performed at [facility] [address] [place], [place] [address]    SPECREQUEST  11/27/2020 1848    NONE Performed at [facility], [place] [address] [place], [place] [address]    CULT >=100,000 COLONIES/mL ENTEROCOCCUS FAECALIS (A) 11/27/2020 1848   REPTSTATUS 12/01/2020 FINAL 11/27/2020 1848     Radiological Exams on Admission: DG Chest Port 1 View  Result Date: 05/31/2021 CLINICAL DATA:  COVID, hypoxia, cough EXAM: PORTABLE CHEST 1 VIEW COMPARISON:  11/29/2020 FINDINGS: Cardiomegaly, tortuous, calcified aorta. Patchy opacity in the right lower lobe. Left lung clear. No effusions or acute bony abnormality. IMPRESSION: Patchy opacity in the right lower lobe, similar to prior study. This could reflect recurrent infiltrate or residual scarring. Cardiomegaly.  Electronically Signed   By: Rolm Baptise M.D.   On: 05/31/2021 18:42   _______________________________________________________________________________________________________ Latest  Blood pressure 105/76, pulse 72, temperature 99.3 F (37.4 C), temperature source Rectal, resp. rate (!) 22, height 5' (1.524 m), weight 48.1 kg, SpO2 92 %.   Vitals  labs and radiology finding personally reviewed  Review of Systems:    Pertinent positives include:   fatigue,  Constitutional:  No weight loss, night sweats, Fevers, chills, weight loss  HEENT:  No  headaches, Difficulty swallowing,Tooth/dental problems,Sore throat,  No sneezing, itching, ear ache, nasal congestion, post nasal drip,  Cardio-vascular:  No chest pain, Orthopnea, PND, anasarca, dizziness, palpitations.no Bilateral lower extremity swelling  GI:  No heartburn, indigestion, abdominal pain, nausea, vomiting, diarrhea, change in bowel habits, loss of appetite, melena, blood in stool, hematemesis Resp:  no shortness of breath at rest. No dyspnea on exertion, No excess mucus, no productive cough, No non-productive cough, No coughing up of blood.No change in color of mucus.No wheezing. Skin:  no rash or lesions. No jaundice GU:  no dysuria, change in color of urine, no urgency or frequency. No straining to urinate.  No flank pain.  Musculoskeletal:  No joint pain or no joint swelling. No decreased range of motion. No back pain.  Psych:  No change in mood or affect. No depression or anxiety. No memory loss.  Neuro: no localizing neurological complaints, no tingling, no weakness, no double vision, no gait abnormality, no slurred speech, no confusion  All systems reviewed and apart from [place] all are negative _______________________________________________________________________________________________ Past Medical History:   Past Medical History:  Diagnosis Date   Allergic rhinitis    Anxiety    Arthritis    "hands" (11/25/2016)   Barrett's esophagus 06/1997   CAP (community acquired pneumonia) 11/25/2016   "this is my 3rd time having pneumonia" (11/25/2016)   Chondromalacia    Chronic lower back pain    Depression    Diverticulosis    GERD (gastroesophageal reflux disease)    Glaucoma    History of hiatal hernia    History of stomach ulcers    Lumbar compression fracture (HCC)    L1 and L2   Osteoporosis    Pneumonia    Polymyalgia (HCC)    Stroke ([place])    Tubular adenoma of colon 06/1999   Vaginitis    Varicose veins     Past Surgical History:  Procedure  Laterality Date   BACK SURGERY     BIOPSY  11/19/2019   Procedure: BIOPSY;  Surgeon: Gatha Mayer, MD;  Location: [facility] ENDOSCOPY;  Service: Endoscopy;;   CATARACT EXTRACTION W/ INTRAOCULAR LENS  IMPLANT, BILATERAL Bilateral    ESOPHAGOGASTRODUODENOSCOPY (EGD) WITH PROPOFOL N/A 11/19/2019   Procedure: ESOPHAGOGASTRODUODENOSCOPY (EGD) WITH PROPOFOL;  Surgeon: Gatha Mayer, MD;  Location: [facility] ENDOSCOPY;  Service: Endoscopy;  Laterality: N/A;   FIXATION KYPHOPLASTY LUMBAR SPINE  10/12/10; 09/20/11   FRACTURE SURGERY     IR KYPHO THORACIC WITH BONE BIOPSY  03/24/2018   IR VERTEBROPLASTY CERV/THOR BX INC UNI/BIL INC/INJECT/IMAGING  03/24/2018   LUMBAR SPINE SURGERY     "did OR on 2 fractures in my lower back"   LUNG BIOPSY     ORIF HIP FRACTURE Right 05/23/2015   Procedure: OPEN REDUCTION INTERNAL FIXATION HIP;  Surgeon: Frederik Pear, MD;  Location: [place];  Service: Orthopedics;  Laterality: Right;   VARICOSE VEIN SURGERY     vein ligation and stripping "years ago"    Social History:  Ambulatory   walker      reports that she has quit smoking. Her smoking use included cigarettes. She has never used smokeless tobacco. She reports that she does not drink alcohol and does not use drugs.   Family History:   Family History  Problem Relation Age of Onset   Colon cancer Mother 25   Congestive Heart Failure Mother    Heart disease Father    Alcohol abuse Father    Heart disease Maternal Grandmother    Fibromyalgia Son    ______________________________________________________________________________________________ Allergies: Allergies  Allergen Reactions   Minocycline Nausea And Vomiting   Paxil [Paroxetine Hcl] Nausea And Vomiting   Sulfa Antibiotics Nausea And Vomiting   Amoxicillin Other (See Comments)    Doesn't remember reaction other than sore mouth Tolerates cefepime   Bupropion Hcl Er (Sr)     Other reaction(s): fatigue   Cyclobenzaprine     Other reaction(s): dry mouth    Mirtazapine Other (See Comments)    Doesn't remember  Other reaction(s): jittery   Vilazodone Hcl Other (See Comments)    Doesn't remember  Other reaction(s): dizziness   Avelox [Moxifloxacin Hcl In Nacl] Other (See Comments)    Felt bad   Cefuroxime Diarrhea    Other reaction(s): severe diarrhea   Clarithromycin Nausea Only    Other reaction(s): stomach pain   Levaquin [Levofloxacin In D5w] Diarrhea   Nystatin-Triamcinolone Rash   Penicillins Rash   Prednisone Nausea Only    Other reaction(s): sick    Prior to Admission medications   Medication Sig Start Date End Date Taking? Authorizing Provider  citalopram (CELEXA) 40 MG tablet Take 1 tablet (40 mg total) by mouth daily. 12/02/20   [place] Eric J, DO  dorzolamide (TRUSOPT) 2 % ophthalmic solution Place 1 drop into both eyes 2 (two) times daily.     [provider]  ferrous sulfate 325 (65 FE) MG tablet Take 1 tablet (325 mg total) by mouth 2 (two) times daily with a meal. 01/27/20   Ghimire, Henreitta Leber, MD  furosemide (LASIX) 20 MG tablet Take 1 tablet (20 mg total) by mouth daily. 12/03/20   [place] Donnamarie Poag, DO  gabapentin (NEURONTIN) 300 MG capsule Take 1 capsule (300 mg total) by mouth at bedtime. 12/02/20 01/01/21  [place] Donnamarie Poag, DO  latanoprost (XALATAN) 0.005 % ophthalmic solution Place 1 drop into both eyes at bedtime.    [provider]  linaclotide (LINZESS) 145 MCG CAPS capsule Take 145 mcg by mouth daily before breakfast.    [provider]  LORazepam (ATIVAN) 2 MG tablet Take 0.5 tablets (1 mg total) by mouth 3 (three) times daily. 12/02/20   [place] Eric J, DO  Multiple Vitamin (MULTIVITAMIN) tablet Take 1 tablet by mouth at bedtime.     [provider]  oxyCODONE-acetaminophen (PERCOCET) 10-325 MG tablet Take 1 tablet by mouth every 6 (six) hours as needed for pain. 12/02/20   [place] Eric J, DO  pantoprazole (PROTONIX) 40 MG tablet Take 1 tablet (40 mg total) by mouth daily. 11/23/19  11/27/20  ShahmehdiValeria Batman, MD  Probiotic Product (ALIGN) 4 MG CAPS Take 4 mg by mouth at bedtime.     [provider]  traZODone (DESYREL) 50 MG tablet Take 0.5 tablets (25 mg total) by mouth at bedtime as needed for sleep. 12/02/20 01/01/21  [place] Eric J, DO    ___________________________________________________________________________________________________ Physical Exam: Vitals with BMI 05/31/2021 05/31/2021 05/31/2021  Height - - -  Weight - - -  BMI - - -  Systolic 443 154 008  Diastolic 76 73 70  Pulse 72 76 76      1. General:  in No  Acute distress   Chronically ill  -appearing 2. Psychological: Alert and  Oriented to self 3. Head/ENT:   Dry Mucous Membranes                          Head Non traumatic, neck supple                           Poor Dentition 4. SKIN:  decreased Skin turgor,  Skin clean Dry and intact no rash 5. Heart: Regular rate and rhythm no  Murmur, no Rub or gallop 6. Lungs: no wheezes or crackles   7. Abdomen: Soft,  non-tender, Non distended bowel sounds present 8. Lower extremities: no clubbing, cyanosis, no  edema 9. Neurologically Grossly intact, moving all 4 extremities equally   10. MSK: Normal range of motion    Chart has been reviewed  ______________________________________________________________________________________________  Assessment/Plan  88 y.o. female with medical history significant of Chronic hypoxia on 2L , dementia, GERD, Anxiety, CKD stage 3, HLD  Admitted for  CoVID infection resulting in hypoxia  Present on Admission:  Polymyalgia ([place])  GERD (gastroesophageal reflux disease)  COVID-19 virus infection  Acute on chronic respiratory failure with hypoxia ([place])  AKI (acute kidney injury) ([place])   CXR finding felt to be due to prior scaring rather than true PNA  Acute on chronic respiratory failure with hypoxia ([place])  this patient has acute respiratory failure with Hypoxia as documented by the presence of  following: O2 saturatio< 90% on RA  Likely due to:   COVID pneumonia Provide O2 therapy and titrate as needed  Continuous pulse ox   check Pulse ox with ambulation prior to discharge   may need  TC consult for home O2 set up   Prone if able  flutter valve ordered   COVID-19 virus infection   FROM SNF WITH KNOWN HX OF COVID19   Immunization status vaccinated    Following concerning LAB/ imaging findings:  COVID-19 Labs  Recent Labs    05/31/21 1820  DDIMER 1.12*  FERRITIN 494*  LDH 137   Hepatic Function Latest Ref Rng & Units 05/31/2021 11/29/2020 11/28/2020  Total Protein 6.5 - 8.1 g/dL 6.8 6.6 5.2(L)  Albumin 3.5 - 5.0 g/dL 3.3(L) 3.3(L) 2.5(L)  AST 15 - 41 U/L 24 32 23  ALT 0 - 44 U/L 15 18 13   Alk Phosphatase 38 - 126 U/L 70 75 48  Total Bilirubin 0.3 - 1.2 mg/dL 0.4 0.4 0.4   Lab Results  Component Value Date   CREATININE 1.55 (H) 05/31/2021   CREATININE 0.73 12/02/2020   CBC    Component Value Date/Time   WBC 10.8 (H) 05/31/2021 1820   PLT 204 05/31/2021 1820   LYMPHSABS 2.1 05/31/2021 1820    Lab Results  Component Value Date   SARSCOV2NAA POSITIVE (A) 05/31/2021   [place] NEGATIVE 11/27/2020   [place] NEGATIVE 09/09/2020   [place] NEGATIVE 01/24/2020    Following complications noted:   evidence of AKI - will provide gentle rehydration   acute respiratory failure with hypoxia - continue oxygen treatment  Plan of treatment: Admit on Airborn Precautions  -given severity of illness initiate steroids And pharmacy consult for remdesivir    - Will follow daily d.dimer - Assess  for ability to prone  - Supportive management -Fluid sparing resuscitation  -Provide oxygen as needed currently on   SpO2: 94 % O2 Flow Rate (L/min): 12 L/min - IF d.dimer elvated >5 will increase dose of lovenox   Poor Prognostic factors  88 y.o.   Evidence of  organ damage  presen  AKI, respiratory failure requiring >4L [place]  tachypnea,  present on admission   ABS neutrophil to lymphocyte ratio >3.5 Risk factors for Cytokine storm Ferritin >250         Will order Airborne and Contact precautions  Family/ patient prognosis discussion: I have discussed case with the family/ patient  who are aware of their prognosis At this point they would like DNR/DNI      The treatment plan and use of medications and known side effects were discussed with patient/ family. It was clearly explained that there is no proven definitive treatment for COVID-19 infection yet. Any medications used here are based on case reports/anecdotal data which are not peer-reviewed and has not been studied using randomized control trials.  Complete risks and long-term side effects are unknown, however in the best clinical judgment they seem to be of some clinical benefit rather than medical risks.  family agree with the treatment plan and want to receive these treatments as indicated.      GERD (gastroesophageal reflux disease) Resume when able to tolerate  AKI (acute kidney injury) ([place]) Obtain electrolytes and gently rehydrate    Other plan as per orders.  DVT prophylaxis:   Lovenox       Code Status:   DNR/DNI   as per family  I had personally discussed CODE STATUS with  family    Family Communication:   Family not at  Bedside  plan of care was discussed on the phone with   Son   Disposition Plan:                              Back to current facility when stable                              Following barriers for discharge:                            antibiotics                             Will need to be able to tolerate PO                            Will likely need home health, home O2, set up                                      Would benefit from PT/OT eval prior to DC  Ordered                                     Transition of care consulted  Palliative care    consulted                                Consults called: none    Admission status:  ED Disposition     ED Disposition  Admit   Condition  --   [place]: [place] [100102]  Level of Care: Progressive [102]  Admit to Progressive based on following criteria: RESPIRATORY PROBLEMS hypoxemic/hypercapnic respiratory failure that is responsive to NIPPV (BiPAP) or High Flow Nasal Cannula (6-80 lpm). Frequent assessment/intervention, no > Q2 hrs < Q4 hrs, to maintain oxygenation and pulmonary hygiene.  May admit patient to [facility] or [facility] if equivalent level of care is available:: No  Covid Evaluation: Confirmed COVID Positive  Diagnosis: COVID-19 virus infection [[id number]]  Admitting Physician: Toy Baker [[id number]]  Attending Physician: Toy Baker [[id number]]  Estimated length of stay: past midnight tomorrow  Certification:: I certify this patient will need inpatient services for at least 2 midnights           inpatient     I Expect 2 midnight stay secondary to severity of patient's current illness need for inpatient interventions justified by the following:  hemodynamic instability despite optimal treatment ( tachypnea hypoxia ) * Severe lab/radiological/exam abnormalities including:     and extensive comorbidities including:  dementia    That are currently affecting medical management.   I expect  patient to be hospitalized for 2 midnights requiring inpatient medical care.  Patient is at high risk for adverse outcome (such as loss of life or disability) if not treated.  Indication for inpatient stay as follows:    New or worsening hypoxia   Need for IV antivirals IV fluids,     Level of care    progressive tele indefinitely please discontinue once patient no longer qualifies COVID-19 Labs    Lab Results  Component Value Date   [place] (A) 05/31/2021     Precautions: admitted as  covid positive Airborne and Contact precautions    Eliezer Khawaja 05/31/2021, 10:04 PM    [facility]     after 2 AM please page floor coverage PA If 7AM-7PM, please contact the day team taking care of the patient using [email or web address]   Patient was evaluated in the context of the global COVID-19 pandemic, which necessitated consideration that the patient might be at risk for infection with the SARS-CoV-2 virus that causes COVID-19. Institutional protocols and algorithms that pertain to the evaluation of patients at risk for COVID-19 are in a state of rapid change based on information released by regulatory bodies including the CDC and federal and state organizations. These policies and algorithms were followed during the patient's care.

## 2021-05-31 NOTE — Assessment & Plan Note (Signed)
Obtain electrolytes and gently rehydrate

## 2021-06-01 ENCOUNTER — Other Ambulatory Visit: Payer: Self-pay

## 2021-06-01 LAB — CBC WITH DIFFERENTIAL/PLATELET
Abs Immature Granulocytes: 0.03 10*3/uL (ref 0.00–0.07)
Basophils Absolute: 0 10*3/uL (ref 0.0–0.1)
Basophils Relative: 0 %
Eosinophils Absolute: 0 10*3/uL (ref 0.0–0.5)
Eosinophils Relative: 0 %
HCT: 39.7 % (ref 36.0–46.0)
Hemoglobin: 12.8 g/dL (ref 12.0–15.0)
Immature Granulocytes: 0 %
Lymphocytes Relative: 12 %
Lymphs Abs: 1 10*3/uL (ref 0.7–4.0)
MCH: 30.8 pg (ref 26.0–34.0)
MCHC: 32.2 g/dL (ref 30.0–36.0)
MCV: 95.4 fL (ref 80.0–100.0)
Monocytes Absolute: 0.2 10*3/uL (ref 0.1–1.0)
Monocytes Relative: 2 %
Neutro Abs: 7.2 10*3/uL (ref 1.7–7.7)
Neutrophils Relative %: 86 %
Platelets: 187 10*3/uL (ref 150–400)
RBC: 4.16 MIL/uL (ref 3.87–5.11)
RDW: 13 % (ref 11.5–15.5)
WBC: 8.4 10*3/uL (ref 4.0–10.5)
nRBC: 0 % (ref 0.0–0.2)

## 2021-06-01 LAB — C-REACTIVE PROTEIN
CRP: 16.6 mg/dL — ABNORMAL HIGH (ref <1.0)
CRP: 19.4 mg/dL — ABNORMAL HIGH (ref <1.0)
CRP: 20.9 mg/dL — ABNORMAL HIGH (ref <1.0)

## 2021-06-01 LAB — COMPREHENSIVE METABOLIC PANEL
ALT: 14 U/L (ref 0–44)
AST: 20 U/L (ref 15–41)
Albumin: 3.1 g/dL — ABNORMAL LOW (ref 3.5–5.0)
Alkaline Phosphatase: 62 U/L (ref 38–126)
Anion gap: 8 (ref 5–15)
BUN: 20 mg/dL (ref 8–23)
CO2: 26 mmol/L (ref 22–32)
Calcium: 8.8 mg/dL — ABNORMAL LOW (ref 8.9–10.3)
Chloride: 100 mmol/L (ref 98–111)
Creatinine, Ser: 1 mg/dL (ref 0.44–1.00)
GFR, Estimated: 52 mL/min — ABNORMAL LOW (ref >60)
Glucose, Bld: 153 mg/dL — ABNORMAL HIGH (ref 70–99)
Potassium: 4.2 mmol/L (ref 3.5–5.1)
Sodium: 134 mmol/L — ABNORMAL LOW (ref 135–145)
Total Bilirubin: 0.2 mg/dL — ABNORMAL LOW (ref 0.3–1.2)
Total Protein: 6.3 g/dL — ABNORMAL LOW (ref 6.5–8.1)

## 2021-06-01 LAB — OSMOLALITY, URINE: Osmolality, Ur: 478 mOsm/kg (ref 300–900)

## 2021-06-01 LAB — PHOSPHORUS
Phosphorus: 3.9 mg/dL (ref 2.5–4.6)
Phosphorus: 3.4 mg/dL (ref 2.5–4.6)

## 2021-06-01 LAB — PROCALCITONIN: Procalcitonin: 0.13 ng/mL

## 2021-06-01 LAB — TSH: TSH: 0.809 u[IU]/mL (ref 0.350–4.500)

## 2021-06-01 LAB — LACTATE DEHYDROGENASE: LDH: 184 U/L (ref 98–192)

## 2021-06-01 LAB — D-DIMER, QUANTITATIVE
D-Dimer, Quant: 1.71 ug/mL-FEU — ABNORMAL HIGH (ref 0.00–0.50)
D-Dimer, Quant: 1.73 ug/mL-FEU — ABNORMAL HIGH (ref 0.00–0.50)

## 2021-06-01 LAB — SODIUM, URINE, RANDOM: Sodium, Ur: 32 mmol/L

## 2021-06-01 LAB — FERRITIN
Ferritin: 511 ng/mL — ABNORMAL HIGH (ref 11–307)
Ferritin: 515 ng/mL — ABNORMAL HIGH (ref 11–307)

## 2021-06-01 LAB — CK: Total CK: 105 U/L (ref 38–234)

## 2021-06-01 LAB — MAGNESIUM
Magnesium: 2 mg/dL (ref 1.7–2.4)
Magnesium: 2 mg/dL (ref 1.7–2.4)

## 2021-06-01 LAB — TROPONIN I (HIGH SENSITIVITY): Troponin I (High Sensitivity): 21 ng/L — ABNORMAL HIGH (ref <18)

## 2021-06-01 LAB — CREATININE, URINE, RANDOM: Creatinine, Urine: 162.68 mg/dL

## 2021-06-01 LAB — FIBRINOGEN: Fibrinogen: 607 mg/dL — ABNORMAL HIGH (ref 210–475)

## 2021-06-01 MED ORDER — OXYCODONE-ACETAMINOPHEN 5-325 MG PO TABS
1.0000 | ORAL_TABLET | ORAL | Status: DC | PRN
  Administered 2021-06-01: 1 via ORAL
  Administered 2021-06-02: 20:00:00 2 via ORAL
  Administered 2021-06-02 – 2021-06-03 (×2): 1 via ORAL
  Administered 2021-06-03 – 2021-06-05 (×4): 2 via ORAL
  Filled 2021-06-01 (×2): qty 2
  Filled 2021-06-01 (×3): qty 1
  Filled 2021-06-01 (×3): qty 2

## 2021-06-01 MED ORDER — PANTOPRAZOLE SODIUM 40 MG PO TBEC
40.0000 mg | DELAYED_RELEASE_TABLET | Freq: Every day | ORAL | Status: DC
  Administered 2021-06-01 – 2021-06-05 (×5): 40 mg via ORAL
  Filled 2021-06-01 (×5): qty 1

## 2021-06-01 MED ORDER — DIVALPROEX SODIUM 125 MG PO CSDR
125.0000 mg | DELAYED_RELEASE_CAPSULE | Freq: Two times a day (BID) | ORAL | Status: DC
  Administered 2021-06-01 – 2021-06-05 (×8): 125 mg via ORAL
  Filled 2021-06-01 (×11): qty 1

## 2021-06-01 MED ORDER — LORAZEPAM 1 MG PO TABS
1.5000 mg | ORAL_TABLET | Freq: Every day | ORAL | Status: DC
  Administered 2021-06-01 – 2021-06-05 (×5): 1.5 mg via ORAL
  Filled 2021-06-01 (×5): qty 1

## 2021-06-01 NOTE — Assessment & Plan Note (Signed)
Resolved with IV fluids 

## 2021-06-01 NOTE — Assessment & Plan Note (Signed)
Continue Protonix °

## 2021-06-01 NOTE — TOC Initial Note (Addendum)
Transition of Care [facility]) - Initial/Assessment Note    Patient Details  Name: KARLINA SUARES MRN: [id number] Date of Birth: Oct 07, 1924  Transition of Care [facility]) CM/SW Contact:    Leeroy Cha, RN Phone Number: 06/01/2021, 8:39 AM  Clinical Narrative:                 Pt is from [facility] from [place] with [facility] wood asking about patient update given. Transition of Care [facility]) Screening Note   Patient Details  Name: HATSUKO BIZZARRO Date of Birth: 06/01/1924   Transition of Care [facility]) CM/SW Contact:    Leeroy Cha, RN Phone Number: 06/01/2021, 8:39 AM    Transition of Care Department [facility]) has reviewed patient and no TOC needs have been identified at this time. We will continue to monitor patient advancement through interdisciplinary progression rounds. If new patient transition needs arise, please place a TOC consult.    Expected Discharge Plan: Home/Self Care Barriers to Discharge: Continued Medical Work up   Patient Goals and CMS Choice Patient states their goals for this hospitalization and ongoing recovery are:: to go home and be well CMS Medicare.gov Compare Post Acute Care list provided to:: Patient    Expected Discharge Plan and Services Expected Discharge Plan: Home/Self Care   Discharge Planning Services: CM Consult   Living arrangements for the past 2 months: Assisted Living Facility                                      Prior Living Arrangements/Services Living arrangements for the past 2 months: [place] Lives with:: Facility Resident Patient language and need for interpreter reviewed:: Yes              Criminal Activity/Legal Involvement Pertinent to Current Situation/Hospitalization: No - Comment as needed  Activities of Daily Living      Permission Sought/Granted                  Emotional Assessment Appearance:: Appears stated age Attitude/Demeanor/Rapport: Engaged Affect (typically  observed): Calm Orientation: : Oriented to Self, Oriented to Place, Oriented to  Time, Oriented to Situation Alcohol / Substance Use: Not Applicable Psych Involvement: No (comment)  Admission diagnosis:  AKI (acute kidney injury) ([place]) [N17.9] Acute on chronic respiratory failure with hypoxia ([place]) [J96.21] COVID-19 virus infection [U07.1] COVID-19 [U07.1] Patient Active Problem List   Diagnosis Date Noted   COVID-19 virus infection 05/31/2021   AKI (acute kidney injury) ([place]) 05/31/2021   Dysphagia 12/02/2020   Protein-calorie malnutrition, severe 11/28/2020   Sepsis ([place]) 11/28/2020   Acute CHF (congestive heart failure) ([place]) 11/28/2020   Underweight 11/28/2020   Multifocal pneumonia 11/27/2020   CAP (community acquired pneumonia) 09/09/2020   Malnutrition ([place]) 09/09/2020   Chronic pain disorder 09/09/2020   Glaucoma 09/09/2020   DNR (do not resuscitate) 09/09/2020   Acute on chronic respiratory failure with hypoxia ([place]) 01/24/2020   Acute hypoxemic respiratory failure ([place]) 01/23/2020   Depression with anxiety    Thrombocytosis    Esophageal mass 11/19/2019   Hiatal hernia    Esophagitis    Pelvic fracture ([place]) 11/11/2019   Acute on chronic anemia 11/25/2016   Hyponatremia 11/25/2016   Closed fracture of femur ([place])    Acute blood loss anemia    Adjustment disorder with mixed anxiety and depressed mood    Fall    Abnormality of gait  Femur fracture, right, closed, initial encounter 05/23/2015   Femoral neck fracture, right, closed, initial encounter 05/22/2015   Syncope 05/30/2012   Nausea & vomiting 05/30/2012   Chest pain 05/30/2012   Hypotension 05/30/2012   GERD (gastroesophageal reflux disease) 12/16/2011   Lumbar compression fracture ([place])    Polymyalgia ([place])    PCP:  [name], DO Pharmacy:   [place], [place] - [address] [place] [address] Phone: [phone] Fax: [phone]  [facility] [place], [place] - [address] [place] [address] Phone: [phone] Fax: [phone]     Social Determinants of Health ([place]) Interventions    Readmission Risk Interventions Readmission Risk Prevention Plan 12/02/2020  Transportation Screening Complete  PCP or Specialist Appt within 5-7 Days Complete  Home Care Screening Complete  Medication Review (RN CM) Complete  Some recent data might be hidden

## 2021-06-01 NOTE — Assessment & Plan Note (Addendum)
Patient is on Depakote and Ativan as an outpatient -Continue Depakote -Continue decreased Ativan 1.5 mg daily

## 2021-06-01 NOTE — Progress Notes (Signed)
°   06/01/21 0645  Urine Characteristics  Urinary Incontinence No  Urine Color Amber  Urine Appearance Cloudy;Sediment  Urinary Interventions Bladder scan;Intermittent/Straight cath  Bladder Scan Volume (mL) 290 mL  Intermittent/Straight Cath (mL) 400 mL  Intermittent Catheter Size 16  Hygiene Peri care   MD paged and notified of pt's inability to void during shift and I&O cath for 400 ml. Pt urine was cloudy, dark amber color with sediments. Peri-care completed on pt and reported off to oncoming RN. Delia Heady RN

## 2021-06-01 NOTE — Progress Notes (Signed)
PROGRESS NOTE    Katrina Burnett  KDX:[id number] DOB: [birth year] DOA: 05/31/2021 PCP: [name], DO   Brief Narrative: Katrina Burnett is a 88 y.o. female with a history of depression, GERD, polymyalgia, chronic pain. Patient presented secondary to shortness of breath and confusion and found to have acute hypoxia secondary to COVID-19 infection with associated pneumonia. Patient started on Remdesivir and steroids.   Assessment & Plan:   * Pneumonia due to COVID-19 virus- (present on admission) Patient started empirically on Remdesivir and Solu-medrol for associated hypoxia. Elevated CRP -Continue Remdesivir and Solu-medrol IV -Follow-up blood cultures -Daily CMP, CBC, CRP, D-dimer  Depression with anxiety- (present on admission) Patient is on Depakote and Ativan as an outpatient -Continue Depakote -Decrease to Ativan 1.5 mg daily  GERD (gastroesophageal reflux disease)- (present on admission) -Continue Protonix  Polymyalgia ([place])- (present on admission) Patient is on morphine (PRN) and Percocet (scheduled) as an outpatient. -Resume home Percocet as PRN  AKI (acute kidney injury) (HCC)-resolved as of 06/01/2021, (present on admission) Resolved with IV fluids      DVT prophylaxis: Lovenox Code Status:   Code Status: DNR Family Communication: Daughter on telephone Disposition Plan: Discharge back to ALF likely in 3+ days pending ability to wean oxygen   Consultants:  None  Procedures:  None  Antimicrobials: Remdesivir    Subjective: Patient reports pain. No dyspnea or chest pain.  Objective: Vitals:   05/31/21 2044 05/31/21 2216 06/01/21 0254 06/01/21 1346  BP:  (!) 144/74 (!) 147/76 (!) 154/91  Pulse: 77 78 66 81  Resp: 16 20 19 18   Temp:  97.7 F (36.5 C) 98.4 F (36.9 C) 97.8 F (36.6 C)  TempSrc:  Oral Oral Oral  SpO2: 94% 98% 100% 98%  Weight:      Height:        Intake/Output Summary (Last 24 hours) at 06/01/2021 1740 Last data filed at  06/01/2021 1700 Gross per 24 hour  Intake 264.68 ml  Output 400 ml  Net -135.32 ml   Filed Weights   05/31/21 1757  Weight: 48.1 kg    Examination:  General exam: Appears calm Respiratory system: Diminished. Respiratory effort normal. Cardiovascular system: S1 & S2 heard, RRR. No murmurs, rubs, gallops or clicks. Gastrointestinal system: Abdomen is nondistended, soft and nontender. No organomegaly or masses felt. Normal bowel sounds heard. Central nervous system: Alert and oriented to self. Musculoskeletal: No edema. No calf tenderness Skin: No cyanosis. No rashes    Data Reviewed: I have personally reviewed following labs and imaging studies  CBC Lab Results  Component Value Date   WBC 8.4 06/01/2021   RBC 4.16 06/01/2021   HGB 12.8 06/01/2021   HCT 39.7 06/01/2021   MCV 95.4 06/01/2021   MCH 30.8 06/01/2021   PLT 187 06/01/2021   MCHC 32.2 06/01/2021   RDW 13.0 06/01/2021   LYMPHSABS 1.0 06/01/2021   MONOABS 0.2 06/01/2021   EOSABS 0.0 06/01/2021   BASOSABS 0.0 97/67/3419     Last metabolic panel Lab Results  Component Value Date   NA 134 (L) 06/01/2021   K 4.2 06/01/2021   CL 100 06/01/2021   CO2 26 06/01/2021   BUN 20 06/01/2021   CREATININE 1.00 06/01/2021   GLUCOSE 153 (H) 06/01/2021   GFRNONAA 52 (L) 06/01/2021   GFRAA 60 (L) 01/27/2020   CALCIUM 8.8 (L) 06/01/2021   PHOS 3.4 06/01/2021   PROT 6.3 (L) 06/01/2021   ALBUMIN 3.1 (L) 06/01/2021   BILITOT 0.2 (L)  06/01/2021   ALKPHOS 62 06/01/2021   AST 20 06/01/2021   ALT 14 06/01/2021   ANIONGAP 8 06/01/2021    CBG (last 3)  No results for input(s): GLUCAP in the last 72 hours.   GFR: Estimated Creatinine Clearance: 24.7 mL/min (by C-G formula based on SCr of 1 mg/dL).  Coagulation Profile: No results for input(s): INR, PROTIME in the last 168 hours.  Recent Results (from the past 240 hour(s))  Resp Panel by RT-PCR (Flu A&B, Covid) Nasopharyngeal Swab     Status: Abnormal   Collection  Time: 05/31/21  6:20 PM   Specimen: Nasopharyngeal Swab; Nasopharyngeal(NP) swabs in vial transport medium  Result Value Ref Range Status   SARS Coronavirus 2 by RT PCR POSITIVE (A) NEGATIVE Final    Comment: (NOTE) SARS-CoV-2 target nucleic acids are DETECTED.  The SARS-CoV-2 RNA is generally detectable in upper respiratory specimens during the acute phase of infection. Positive results are indicative of the presence of the identified virus, but do not rule out bacterial infection or co-infection with other pathogens not detected by the test. Clinical correlation with patient history and other diagnostic information is necessary to determine patient infection status. The expected result is Negative.  Fact Sheet for Patients: EntrepreneurPulse.com.au  Fact Sheet for Healthcare Providers: IncredibleEmployment.be  This test is not yet approved or cleared by the [place] FDA and  has been authorized for detection and/or diagnosis of SARS-CoV-2 by FDA under an Emergency Use Authorization (EUA).  This EUA will remain in effect (meaning this test can be used) for the duration of  the COVID-19 declaration under Section 564(b)(1) of the A ct, 21 U.S.C. section 360bbb-3(b)(1), unless the authorization is terminated or revoked sooner.     Influenza A by PCR NEGATIVE NEGATIVE Final   Influenza B by PCR NEGATIVE NEGATIVE Final    Comment: (NOTE) The Xpert Xpress SARS-CoV-2/FLU/RSV plus assay is intended as an aid in the diagnosis of influenza from Nasopharyngeal swab specimens and should not be used as a sole basis for treatment. Nasal washings and aspirates are unacceptable for Xpert Xpress SARS-CoV-2/FLU/RSV testing.  Fact Sheet for Patients: EntrepreneurPulse.com.au  Fact Sheet for Healthcare Providers: IncredibleEmployment.be  This test is not yet approved or cleared by the [place] FDA and has been  authorized for detection and/or diagnosis of SARS-CoV-2 by FDA under an Emergency Use Authorization (EUA). This EUA will remain in effect (meaning this test can be used) for the duration of the COVID-19 declaration under Section 564(b)(1) of the Act, 21 U.S.C. section 360bbb-3(b)(1), unless the authorization is terminated or revoked.  Performed at [facility], [place] [address] [place], [place] [address]         Radiology Studies: [facility] Chest Port 1 View  Result Date: 05/31/2021 CLINICAL DATA:  COVID, hypoxia, cough EXAM: PORTABLE CHEST 1 VIEW COMPARISON:  11/29/2020 FINDINGS: Cardiomegaly, tortuous, calcified aorta. Patchy opacity in the right lower lobe. Left lung clear. No effusions or acute bony abnormality. IMPRESSION: Patchy opacity in the right lower lobe, similar to prior study. This could reflect recurrent infiltrate or residual scarring. Cardiomegaly. Electronically Signed   By: Rolm Baptise M.D.   On: 05/31/2021 18:42        Scheduled Meds:  albuterol  2 puff Inhalation Q6H   divalproex  125 mg Oral BID   dorzolamide  1 drop Both Eyes BID   enoxaparin (LOVENOX) injection  30 mg Subcutaneous Q24H   latanoprost  1 drop Both Eyes QHS  LORazepam  1.5 mg Oral Daily   methylPREDNISolone (SOLU-MEDROL) injection  0.5 mg/kg Intravenous Q12H   Followed by   Derrill Memo ON 06/04/2021] predniSONE  50 mg Oral Q breakfast   pantoprazole  40 mg Oral Daily   Continuous Infusions:  remdesivir 100 mg in NS 100 mL 100 mg (06/01/21 1611)     LOS: 1 day     Cordelia Poche, MD [facility] 06/01/2021, 5:40 PM  If 7PM-7AM, please contact night-coverage [email or web address]

## 2021-06-01 NOTE — Assessment & Plan Note (Addendum)
Patient is on morphine (PRN) and Percocet (scheduled) as an outpatient. -Continue home Percocet as PRN rather than scheduled

## 2021-06-01 NOTE — Hospital Course (Signed)
Katrina Burnett is a 88 y.o. female with a history of depression, GERD, polymyalgia, chronic pain. Patient presented secondary to shortness of breath and confusion and found to have acute hypoxia secondary to COVID-19 infection with associated pneumonia. Patient started on Remdesivir and steroids.

## 2021-06-01 NOTE — Assessment & Plan Note (Addendum)
Patient started empirically on Remdesivir and Solu-medrol for associated hypoxia. Elevated CRP which is trending down. D-dimer trending down. Blood cultures with no growth to date. -Continue Remdesivir and Solu-medrol IV > prednisone -Follow-up blood cultures -Daily CMP, CBC, CRP, D-dimer

## 2021-06-02 DIAGNOSIS — M353 Polymyalgia rheumatica: Secondary | ICD-10-CM

## 2021-06-02 DIAGNOSIS — F418 Other specified anxiety disorders: Secondary | ICD-10-CM

## 2021-06-02 LAB — CBC WITH DIFFERENTIAL/PLATELET
Abs Immature Granulocytes: 0.04 10*3/uL (ref 0.00–0.07)
Basophils Absolute: 0 10*3/uL (ref 0.0–0.1)
Basophils Relative: 0 %
Eosinophils Absolute: 0 10*3/uL (ref 0.0–0.5)
Eosinophils Relative: 0 %
HCT: 36 % (ref 36.0–46.0)
Hemoglobin: 11.7 g/dL — ABNORMAL LOW (ref 12.0–15.0)
Immature Granulocytes: 1 %
Lymphocytes Relative: 10 %
Lymphs Abs: 0.8 10*3/uL (ref 0.7–4.0)
MCH: 31.2 pg (ref 26.0–34.0)
MCHC: 32.5 g/dL (ref 30.0–36.0)
MCV: 96 fL (ref 80.0–100.0)
Monocytes Absolute: 0.3 10*3/uL (ref 0.1–1.0)
Monocytes Relative: 3 %
Neutro Abs: 7.3 10*3/uL (ref 1.7–7.7)
Neutrophils Relative %: 86 %
Platelets: 210 10*3/uL (ref 150–400)
RBC: 3.75 MIL/uL — ABNORMAL LOW (ref 3.87–5.11)
RDW: 12.9 % (ref 11.5–15.5)
WBC: 8.4 10*3/uL (ref 4.0–10.5)
nRBC: 0 % (ref 0.0–0.2)

## 2021-06-02 LAB — C-REACTIVE PROTEIN: CRP: 13.1 mg/dL — ABNORMAL HIGH (ref <1.0)

## 2021-06-02 LAB — COMPREHENSIVE METABOLIC PANEL
ALT: 16 U/L (ref 0–44)
AST: 26 U/L (ref 15–41)
Albumin: 2.9 g/dL — ABNORMAL LOW (ref 3.5–5.0)
Alkaline Phosphatase: 52 U/L (ref 38–126)
Anion gap: 8 (ref 5–15)
BUN: 26 mg/dL — ABNORMAL HIGH (ref 8–23)
CO2: 27 mmol/L (ref 22–32)
Calcium: 9.3 mg/dL (ref 8.9–10.3)
Chloride: 103 mmol/L (ref 98–111)
Creatinine, Ser: 0.7 mg/dL (ref 0.44–1.00)
GFR, Estimated: >60 mL/min (ref >60)
Glucose, Bld: 146 mg/dL — ABNORMAL HIGH (ref 70–99)
Potassium: 4.5 mmol/L (ref 3.5–5.1)
Sodium: 138 mmol/L (ref 135–145)
Total Bilirubin: 0.4 mg/dL (ref 0.3–1.2)
Total Protein: 6.3 g/dL — ABNORMAL LOW (ref 6.5–8.1)

## 2021-06-02 LAB — PHOSPHORUS: Phosphorus: 2.5 mg/dL (ref 2.5–4.6)

## 2021-06-02 LAB — FERRITIN: Ferritin: 511 ng/mL — ABNORMAL HIGH (ref 11–307)

## 2021-06-02 LAB — D-DIMER, QUANTITATIVE: D-Dimer, Quant: 0.81 ug/mL-FEU — ABNORMAL HIGH (ref 0.00–0.50)

## 2021-06-02 LAB — MAGNESIUM: Magnesium: 2.2 mg/dL (ref 1.7–2.4)

## 2021-06-02 MED ORDER — ADULT MULTIVITAMIN W/MINERALS CH
1.0000 | ORAL_TABLET | Freq: Every day | ORAL | Status: DC
  Administered 2021-06-03 – 2021-06-05 (×3): 1 via ORAL
  Filled 2021-06-02 (×3): qty 1

## 2021-06-02 MED ORDER — ENSURE ENLIVE PO LIQD
237.0000 mL | Freq: Three times a day (TID) | ORAL | Status: DC
  Administered 2021-06-02 – 2021-06-03 (×3): 237 mL via ORAL
  Administered 2021-06-03: 21:00:00 150 mL via ORAL
  Administered 2021-06-03 – 2021-06-05 (×5): 237 mL via ORAL

## 2021-06-02 NOTE — Progress Notes (Signed)
Initial Nutrition Assessment  DOCUMENTATION CODES:  Not applicable  INTERVENTION:  Liberalize diet to regular due to advanced age and poor intake, encourage PO intake. Ensure Enlive po TID, each supplement provides 350 kcal and 20 grams of protein MVI with minerals daily Obtain new measured weight  NUTRITION DIAGNOSIS:  Inadequate oral intake related to decreased appetite as evidenced by meal completion < 25%.  GOAL:  Patient will meet greater than or equal to 90% of their needs   MONITOR:  PO intake, Supplement acceptance, Labs, Weight trends  REASON FOR ASSESSMENT:  Malnutrition Screening Tool    ASSESSMENT:  88 y.o. female with history of chronic hypoxia on 2L O2, Barrett's esophagus, osteoporosis, diverticulosis, hx stroke, dementia, GERD, Anxiety, CKD stage 3, and HLD presented to ED from her ALF with worsening confusion and hypoxia after being dx with COVID19 on 1/19.  Pt found to have pneumonia related to [place].   Pt has been seen multiple times in the past by [facility] team and previously dx with severe malnutrition. Noted in previous notes that pt is a poor historian and unable to give a complete nutrition hx due to dementia. Daughter noted to have expressed pt requires full-time assistance with ADLs.   Discussed pt with RN, confirms that pt's intake is poor. Would recommend liberalizing diet and adding nutrition supplements to augment intake.   Pt has a hx of being underweight, current weight appears stated but is no longer falling into underweight category. Will request new measured to determine accuracy.  Average Meal Intake: 1/23-1/24: 15% average intake x 2 recorded meals  Nutritionally Relevant Medications: Scheduled Meds:  methylPREDNISolone (SOLU-MEDROL) injection  0.5 mg/kg Intravenous Q12H   pantoprazole  40 mg Oral Daily   Continuous Infusions:  remdesivir 100 mg in NS 100 mL 100 mg (06/02/21 0905)   Labs Reviewed: BUN: 26 Ferritin:  511 CRP: 13.1 (trending down) SBG ranges from 146-153 mg/dL over the last 24 hours (likely due to steroids)   NUTRITION - FOCUSED PHYSICAL EXAM: Defer to in-person assessment  Diet Order:   Diet Order             Diet Heart Room service appropriate? Yes; Fluid consistency: Thin  Diet effective now                   EDUCATION NEEDS:  No education needs have been identified at this time  Skin:  Skin Assessment: Reviewed RN Assessment (abrasions the to BLE, ecchymosis to the back, arms, and hands)  Last BM:  1/23 - type 6  Height:  Ht Readings from Last 1 Encounters:  05/31/21 5' (1.524 m)    Weight:  Wt Readings from Last 1 Encounters:  05/31/21 48.1 kg    Ideal Body Weight:  45.5 kg  BMI:  Body mass index is 20.7 kg/m.  Estimated Nutritional Needs:  Kcal:  1400-1600 kcal/d Protein:  70-80 g/d Fluid:  1.5-1.8 L/d   Ranell Patrick, RD, LDN Clinical Dietitian RD pager # available in AMION  After hours/weekend pager # available in [facility]

## 2021-06-02 NOTE — Plan of Care (Signed)
  Problem: Education: Goal: Knowledge of risk factors and measures for prevention of condition will improve Outcome: Progressing   Problem: Coping: Goal: Psychosocial and spiritual needs will be supported Outcome: Progressing   Problem: Respiratory: Goal: Will maintain a patent airway Outcome: Progressing Goal: Complications related to the disease process, condition or treatment will be avoided or minimized Outcome: Progressing   

## 2021-06-02 NOTE — Plan of Care (Signed)
Pt slept well this shift. VSS. Continues on 6L via . Denies any needs at this time.

## 2021-06-02 NOTE — Consult Note (Signed)
Consultation Note Date: 06/02/2021   Patient Name: Katrina Burnett  DOB: 03/14/1925  MRN: [id number]  Age / Sex: 88 y.o., female  PCP: [name], DO Referring Physician: Mariel Aloe, MD  Reason for Consultation: Establishing goals of care  HPI/Patient Profile: 88 y.o. female  with past medical history of depression, GERD, polymyalgia, and chronic pain admitted on 05/31/2021 with acute hypoxia secondary to COVID-19 infection with associated pneumonia. PMT consulted to discuss [place].   Clinical Assessment and Goals of Care: I have reviewed medical records including EPIC notes, labs and imaging, received report from RN, assessed the patient and then spoke with patient's daughter [name]  to discuss diagnosis prognosis, [place], EOL wishes, disposition and options.  I introduced Palliative Medicine as specialized medical care for people living with serious illness. It focuses on providing relief from the symptoms and stress of a serious illness. The goal is to improve quality of life for both the patient and the family.  We discussed a brief life review of the patient. [name] tells me about how resilient [name] is. She raised 4 children on her own and held a job.   We discuss an accident [name] suffered with a dog attack several years ago that continues to affect her.   As far as functional and nutritional status, [name] shares that the patient requires 24/7 care. She is dependent on her caregivers for ADLs. [name] reports that she does eat well. Patient has been living at ALF with 24/7 hired caregivers. [name] tells me patient's cognitive ability fluctuates; she is easily confused.     We discussed patient's current illness and what it means in the larger context of patient's on-going co-morbidities.  Natural disease trajectory and expectations at EOL were discussed. We discussed patient's current infections. We discussed concern about failure to  thrive and what her new baseline might be following severe illness. We discuss her fatigue and poor PO intake. We discuss improvement in creatinine, resolution of AKI with IV fluids.  I attempted to elicit values and goals of care important to the patient.    The difference between aggressive medical intervention and comfort care was considered in light of the patient's goals of care. [name] would like to continue current measures and see how patient responds. [name] feels patient has the amount of support she needs at her ALF to return there.   [name] would like to see how patient does in coming days then have further discussion about how to approach her care moving forward.   Discussed with [name] the importance of continued conversation with family and the medical providers regarding overall plan of care and treatment options, ensuring decisions are within the context of the patients values and GOCs.    Questions and concerns were addressed. The family was encouraged to call with questions or concerns.   Primary Decision Maker NEXT OF KIN - children    SUMMARY OF RECOMMENDATIONS   - continue current measures, allow time for outcomes - PMT to follow up 1/26  Code Status/Advance Care Planning: DNR  Discharge Planning:  likely back to ALF with 24/7 caregivers       Primary Diagnoses: Present on Admission:  Polymyalgia ([place])  GERD (gastroesophageal reflux disease)  Pneumonia due to COVID-19 virus  Acute on chronic respiratory failure with hypoxia ([place])  (Resolved) AKI (acute kidney injury) ([place])  Depression with anxiety   I have reviewed the medical record, interviewed the patient and family, and examined the patient. The following  aspects are pertinent.  Past Medical History:  Diagnosis Date   Allergic rhinitis    Anxiety    Arthritis    "hands" (11/25/2016)   Barrett's esophagus 06/1997   CAP (community acquired pneumonia) 11/25/2016   "this is my 3rd time having pneumonia"  (11/25/2016)   Chondromalacia    Chronic lower back pain    Depression    Diverticulosis    GERD (gastroesophageal reflux disease)    Glaucoma    History of hiatal hernia    History of stomach ulcers    Lumbar compression fracture (HCC)    L1 and L2   Osteoporosis    Pneumonia    Polymyalgia ([place])    Stroke ([place])    Tubular adenoma of colon 06/1999   Vaginitis    Varicose veins    Social History   Socioeconomic History   Marital status: Widowed    Spouse name: Not on file   Number of children: 4   Years of education: Not on file   Highest education level: Not on file  Occupational History   Occupation: Retired  Tobacco Use   Smoking status: Former    Years: 1.00    Types: Cigarettes   Smokeless tobacco: Never   Tobacco comments:    At age 39 yrs old but nothing since   Vaping Use   Vaping Use: Never used  Substance and Sexual Activity   Alcohol use: No   Drug use: No   Sexual activity: Never    Birth control/protection: Post-menopausal  Other Topics Concern   Not on file  Social History Narrative   Daily caffeine    Social Determinants of Health   Financial Resource Strain: Not on file  Food Insecurity: Not on file  Transportation Needs: Not on file  Physical Activity: Not on file  Stress: Not on file  Social Connections: Not on file   Family History  Problem Relation Age of Onset   Colon cancer Mother 48   Congestive Heart Failure Mother    Heart disease Father    Alcohol abuse Father    Heart disease Maternal Grandmother    Fibromyalgia Son    Scheduled Meds:  albuterol  2 puff Inhalation Q6H   divalproex  125 mg Oral BID   dorzolamide  1 drop Both Eyes BID   enoxaparin (LOVENOX) injection  30 mg Subcutaneous Q24H   feeding supplement  237 mL Oral TID BM   latanoprost  1 drop Both Eyes QHS   LORazepam  1.5 mg Oral Daily   methylPREDNISolone (SOLU-MEDROL) injection  0.5 mg/kg Intravenous Q12H   Followed by   Derrill Memo ON 06/04/2021] predniSONE   50 mg Oral Q breakfast   multivitamin with minerals  1 tablet Oral Daily   pantoprazole  40 mg Oral Daily   Continuous Infusions:  remdesivir 100 mg in NS 100 mL 100 mg (06/02/21 0905)   PRN Meds:.acetaminophen **OR** acetaminophen, oxyCODONE-acetaminophen, traZODone Allergies  Allergen Reactions   Minocycline Nausea And Vomiting   Paxil [Paroxetine Hcl] Nausea And Vomiting   Sulfa Antibiotics Nausea And Vomiting   Amoxicillin Other (See Comments)    Doesn't remember reaction other than sore mouth Tolerates cefepime   Bupropion Hcl Er (Sr)     Other reaction(s): fatigue   Cyclobenzaprine     Other reaction(s): dry mouth   Mirtazapine Other (See Comments)    Doesn't remember  Other reaction(s): jittery   Vilazodone Hcl Other (See Comments)    Doesn't remember  Other reaction(s): dizziness   Avelox [Moxifloxacin Hcl In Nacl] Other (See Comments)    Felt bad   Cefuroxime Diarrhea    Other reaction(s): severe diarrhea   Clarithromycin Nausea Only    Other reaction(s): stomach pain   Levaquin [Levofloxacin In D5w] Diarrhea   Nystatin-Triamcinolone Rash   Penicillins Rash   Prednisone Nausea Only    Other reaction(s): sick   Review of Systems  Unable to perform ROS: Mental status change   Physical Exam Constitutional:      Comments: lethargic  Pulmonary:     Effort: Pulmonary effort is normal.     Comments: Remains on [place] Musculoskeletal:     Right lower leg: No edema.     Left lower leg: No edema.  Skin:    General: Skin is warm and dry.  Neurological:     Comments: UTA - patient barely interactive    Vital Signs: BP (!) 120/95 (BP Location: Left Arm)    Pulse 76    Temp 97.8 F (36.6 C) (Oral)    Resp 20    Ht 5' (1.524 m)    Wt 48.1 kg    SpO2 95%    BMI 20.70 kg/m  Pain Scale: Faces   Pain Score: 8    SpO2: SpO2: 95 % O2 Device:SpO2: 95 % O2 Flow Rate: .O2 Flow Rate (L/min): 6 L/min  IO: Intake/output summary:  Intake/Output Summary (Last 24 hours) at  06/02/2021 1352 Last data filed at 06/02/2021 1325 Gross per 24 hour  Intake 700 ml  Output 300 ml  Net 400 ml    LBM:   Baseline Weight: Weight: 48.1 kg Most recent weight: Weight: 48.1 kg     Palliative Assessment/Data: PPS 20%    Juel Burrow, DNP, [facility] Palliative Medicine Team [phone] Pager: [phone]

## 2021-06-02 NOTE — Progress Notes (Signed)
PROGRESS NOTE    Katrina Burnett  JJO:[id number] DOB: July 11, 1924 DOA: 05/31/2021 PCP: [name], DO   Brief Narrative: Katrina Burnett is a 88 y.o. female with a history of depression, GERD, polymyalgia, chronic pain. Patient presented secondary to shortness of breath and confusion and found to have acute hypoxia secondary to COVID-19 infection with associated pneumonia. Patient started on Remdesivir and steroids.   Assessment & Plan:   * Pneumonia due to COVID-19 virus- (present on admission) Patient started empirically on Remdesivir and Solu-medrol for associated hypoxia. Elevated CRP which is trending down. D-dimer trending down. Blood cultures with no growth to date. -Continue Remdesivir and Solu-medrol IV > prednisone -Follow-up blood cultures -Daily CMP, CBC, CRP, D-dimer  Depression with anxiety- (present on admission) Patient is on Depakote and Ativan as an outpatient -Continue Depakote -Continue decreased Ativan 1.5 mg daily  GERD (gastroesophageal reflux disease)- (present on admission) -Continue Protonix  Polymyalgia ([place])- (present on admission) Patient is on morphine (PRN) and Percocet (scheduled) as an outpatient. -Continue home Percocet as PRN rather than scheduled  AKI (acute kidney injury) (HCC)-resolved as of 06/01/2021, (present on admission) Resolved with IV fluids      DVT prophylaxis: Lovenox Code Status:   Code Status: DNR Family Communication: Daughter on telephone Disposition Plan: Discharge back to ALF likely in 2-4 days pending ability to wean oxygen   Consultants:  Palliative care medicine  Procedures:  None  Antimicrobials: Remdesivir    Subjective: Patient reports not wanting to interact with me today  Objective: Vitals:   06/01/21 1346 06/01/21 2033 06/02/21 0532 06/02/21 1317  BP: (!) 154/91 (!) 153/74 (!) 158/68 (!) 120/95  Pulse: 81 68 64 76  Resp: 18 18 16 20   Temp: 97.8 F (36.6 C) 98 F (36.7 C) (!) 97.2 F (36.2 C)  97.8 F (36.6 C)  TempSrc: Oral   Oral  SpO2: 98% 100% 99% 95%  Weight:      Height:        Intake/Output Summary (Last 24 hours) at 06/02/2021 1357 Last data filed at 06/02/2021 1325 Gross per 24 hour  Intake 640 ml  Output 300 ml  Net 340 ml    Filed Weights   05/31/21 1757  Weight: 48.1 kg    Examination:  Patient declined examination    Data Reviewed: I have personally reviewed following labs and imaging studies  CBC Lab Results  Component Value Date   WBC 8.4 06/02/2021   RBC 3.75 (L) 06/02/2021   HGB 11.7 (L) 06/02/2021   HCT 36.0 06/02/2021   MCV 96.0 06/02/2021   MCH 31.2 06/02/2021   PLT 210 06/02/2021   MCHC 32.5 06/02/2021   RDW 12.9 06/02/2021   LYMPHSABS 0.8 06/02/2021   MONOABS 0.3 06/02/2021   EOSABS 0.0 06/02/2021   BASOSABS 0.0 16/05/930     Last metabolic panel Lab Results  Component Value Date   NA 138 06/02/2021   K 4.5 06/02/2021   CL 103 06/02/2021   CO2 27 06/02/2021   BUN 26 (H) 06/02/2021   CREATININE 0.70 06/02/2021   GLUCOSE 146 (H) 06/02/2021   GFRNONAA >60 06/02/2021   GFRAA 60 (L) 01/27/2020   CALCIUM 9.3 06/02/2021   PHOS 2.5 06/02/2021   PROT 6.3 (L) 06/02/2021   ALBUMIN 2.9 (L) 06/02/2021   BILITOT 0.4 06/02/2021   ALKPHOS 52 06/02/2021   AST 26 06/02/2021   ALT 16 06/02/2021   ANIONGAP 8 06/02/2021    CBG (last 3)  No  results for input(s): GLUCAP in the last 72 hours.   GFR: Estimated Creatinine Clearance: 30.9 mL/min (by C-G formula based on SCr of 0.7 mg/dL).  Coagulation Profile: No results for input(s): INR, PROTIME in the last 168 hours.  Recent Results (from the past 240 hour(s))  Resp Panel by RT-PCR (Flu A&B, Covid) Nasopharyngeal Swab     Status: Abnormal   Collection Time: 05/31/21  6:20 PM   Specimen: Nasopharyngeal Swab; Nasopharyngeal(NP) swabs in vial transport medium  Result Value Ref Range Status   SARS Coronavirus 2 by RT PCR POSITIVE (A) NEGATIVE Final    Comment:  (NOTE) SARS-CoV-2 target nucleic acids are DETECTED.  The SARS-CoV-2 RNA is generally detectable in upper respiratory specimens during the acute phase of infection. Positive results are indicative of the presence of the identified virus, but do not rule out bacterial infection or co-infection with other pathogens not detected by the test. Clinical correlation with patient history and other diagnostic information is necessary to determine patient infection status. The expected result is Negative.  Fact Sheet for Patients: EntrepreneurPulse.com.au  Fact Sheet for Healthcare Providers: IncredibleEmployment.be  This test is not yet approved or cleared by the [place] FDA and  has been authorized for detection and/or diagnosis of SARS-CoV-2 by FDA under an Emergency Use Authorization (EUA).  This EUA will remain in effect (meaning this test can be used) for the duration of  the COVID-19 declaration under Section 564(b)(1) of the A ct, 21 U.S.C. section 360bbb-3(b)(1), unless the authorization is terminated or revoked sooner.     Influenza A by PCR NEGATIVE NEGATIVE Final   Influenza B by PCR NEGATIVE NEGATIVE Final    Comment: (NOTE) The Xpert Xpress SARS-CoV-2/FLU/RSV plus assay is intended as an aid in the diagnosis of influenza from Nasopharyngeal swab specimens and should not be used as a sole basis for treatment. Nasal washings and aspirates are unacceptable for Xpert Xpress SARS-CoV-2/FLU/RSV testing.  Fact Sheet for Patients: EntrepreneurPulse.com.au  Fact Sheet for Healthcare Providers: IncredibleEmployment.be  This test is not yet approved or cleared by the [place] FDA and has been authorized for detection and/or diagnosis of SARS-CoV-2 by FDA under an Emergency Use Authorization (EUA). This EUA will remain in effect (meaning this test can be used) for the duration of the COVID-19  declaration under Section 564(b)(1) of the Act, 21 U.S.C. section 360bbb-3(b)(1), unless the authorization is terminated or revoked.  Performed at [facility], [place] [address], [place], [place] [address]   Blood Culture (routine x 2)     Status: None (Preliminary result)   Collection Time: 05/31/21  6:20 PM   Specimen: BLOOD  Result Value Ref Range Status   Specimen Description   Final    BLOOD RIGHT WRIST Performed at [place] [address], [place], [place] [address]    Special Requests   Final    BOTTLES DRAWN AEROBIC AND ANAEROBIC Blood Culture adequate volume Performed at [place] [address], [place], [place] [address]    Culture   Final    NO GROWTH 1 DAY Performed at [facility], [place] [address], [place], [place] [address]    Report Status PENDING  Incomplete  Blood Culture (routine x 2)     Status: None (Preliminary result)   Collection Time: 05/31/21  6:28 PM   Specimen: BLOOD  Result Value Ref Range Status   Specimen Description   Final    BLOOD RIGHT HAND  Performed at [facility], [place] [address] [place], [place] [address]    Special Requests   Final    BOTTLES DRAWN AEROBIC AND ANAEROBIC Blood Culture adequate volume Performed at [place] [address], [place], [place] [address]    Culture   Final    NO GROWTH 1 DAY Performed at [facility], [place] [address], [place], [place] [address]    Report Status PENDING  Incomplete        Radiology Studies: DG Chest Port 1 View  Result Date: 05/31/2021 CLINICAL DATA:  COVID, hypoxia, cough EXAM: PORTABLE CHEST 1 VIEW COMPARISON:  11/29/2020 FINDINGS: Cardiomegaly, tortuous, calcified aorta. Patchy opacity in the right lower lobe. Left lung clear. No effusions or acute bony abnormality. IMPRESSION: Patchy opacity in the right lower lobe, similar to prior study. This could reflect recurrent  infiltrate or residual scarring. Cardiomegaly. Electronically Signed   By: Rolm Baptise M.D.   On: 05/31/2021 18:42        Scheduled Meds:  albuterol  2 puff Inhalation Q6H   divalproex  125 mg Oral BID   dorzolamide  1 drop Both Eyes BID   enoxaparin (LOVENOX) injection  30 mg Subcutaneous Q24H   feeding supplement  237 mL Oral TID BM   latanoprost  1 drop Both Eyes QHS   LORazepam  1.5 mg Oral Daily   methylPREDNISolone (SOLU-MEDROL) injection  0.5 mg/kg Intravenous Q12H   Followed by   Derrill Memo ON 06/04/2021] predniSONE  50 mg Oral Q breakfast   multivitamin with minerals  1 tablet Oral Daily   pantoprazole  40 mg Oral Daily   Continuous Infusions:  remdesivir 100 mg in NS 100 mL 100 mg (06/02/21 0905)     LOS: 2 days     Cordelia Poche, MD [facility] 06/02/2021, 1:57 PM  If 7PM-7AM, please contact night-coverage [email or web address]

## 2021-06-03 ENCOUNTER — Inpatient Hospital Stay (HOSPITAL_COMMUNITY): Payer: Medicare Other

## 2021-06-03 DIAGNOSIS — R7989 Other specified abnormal findings of blood chemistry: Secondary | ICD-10-CM | POA: Diagnosis not present

## 2021-06-03 DIAGNOSIS — J1282 Pneumonia due to coronavirus disease 2019: Secondary | ICD-10-CM

## 2021-06-03 DIAGNOSIS — U071 COVID-19: Principal | ICD-10-CM

## 2021-06-03 DIAGNOSIS — N179 Acute kidney failure, unspecified: Secondary | ICD-10-CM

## 2021-06-03 LAB — COMPREHENSIVE METABOLIC PANEL
ALT: 16 U/L (ref 0–44)
AST: 22 U/L (ref 15–41)
Albumin: 2.8 g/dL — ABNORMAL LOW (ref 3.5–5.0)
Alkaline Phosphatase: 51 U/L (ref 38–126)
Anion gap: 8 (ref 5–15)
BUN: 28 mg/dL — ABNORMAL HIGH (ref 8–23)
CO2: 27 mmol/L (ref 22–32)
Calcium: 9.2 mg/dL (ref 8.9–10.3)
Chloride: 104 mmol/L (ref 98–111)
Creatinine, Ser: 0.64 mg/dL (ref 0.44–1.00)
GFR, Estimated: >60 mL/min (ref >60)
Glucose, Bld: 131 mg/dL — ABNORMAL HIGH (ref 70–99)
Potassium: 4 mmol/L (ref 3.5–5.1)
Sodium: 139 mmol/L (ref 135–145)
Total Bilirubin: 0.4 mg/dL (ref 0.3–1.2)
Total Protein: 5.9 g/dL — ABNORMAL LOW (ref 6.5–8.1)

## 2021-06-03 LAB — MAGNESIUM: Magnesium: 2.3 mg/dL (ref 1.7–2.4)

## 2021-06-03 LAB — CBC WITH DIFFERENTIAL/PLATELET
Abs Immature Granulocytes: 0.05 10*3/uL (ref 0.00–0.07)
Basophils Absolute: 0 10*3/uL (ref 0.0–0.1)
Basophils Relative: 0 %
Eosinophils Absolute: 0 10*3/uL (ref 0.0–0.5)
Eosinophils Relative: 0 %
HCT: 35.2 % — ABNORMAL LOW (ref 36.0–46.0)
Hemoglobin: 11.5 g/dL — ABNORMAL LOW (ref 12.0–15.0)
Immature Granulocytes: 1 %
Lymphocytes Relative: 7 %
Lymphs Abs: 0.8 10*3/uL (ref 0.7–4.0)
MCH: 31 pg (ref 26.0–34.0)
MCHC: 32.7 g/dL (ref 30.0–36.0)
MCV: 94.9 fL (ref 80.0–100.0)
Monocytes Absolute: 0.4 10*3/uL (ref 0.1–1.0)
Monocytes Relative: 3 %
Neutro Abs: 9.8 10*3/uL — ABNORMAL HIGH (ref 1.7–7.7)
Neutrophils Relative %: 89 %
Platelets: 293 10*3/uL (ref 150–400)
RBC: 3.71 MIL/uL — ABNORMAL LOW (ref 3.87–5.11)
RDW: 12.9 % (ref 11.5–15.5)
WBC: 10.9 10*3/uL — ABNORMAL HIGH (ref 4.0–10.5)
nRBC: 0 % (ref 0.0–0.2)

## 2021-06-03 LAB — D-DIMER, QUANTITATIVE: D-Dimer, Quant: 3.29 ug/mL-FEU — ABNORMAL HIGH (ref 0.00–0.50)

## 2021-06-03 LAB — C-REACTIVE PROTEIN: CRP: 4.3 mg/dL — ABNORMAL HIGH (ref <1.0)

## 2021-06-03 LAB — PHOSPHORUS: Phosphorus: 1.9 mg/dL — ABNORMAL LOW (ref 2.5–4.6)

## 2021-06-03 LAB — FERRITIN: Ferritin: 493 ng/mL — ABNORMAL HIGH (ref 11–307)

## 2021-06-03 MED ORDER — ENOXAPARIN SODIUM 40 MG/0.4ML IJ SOSY
40.0000 mg | PREFILLED_SYRINGE | INTRAMUSCULAR | Status: DC
  Administered 2021-06-04 – 2021-06-05 (×2): 40 mg via SUBCUTANEOUS
  Filled 2021-06-03 (×2): qty 0.4

## 2021-06-03 MED ORDER — DOCUSATE SODIUM 100 MG PO CAPS
100.0000 mg | ORAL_CAPSULE | Freq: Every day | ORAL | Status: DC
  Administered 2021-06-03 – 2021-06-05 (×2): 100 mg via ORAL
  Filled 2021-06-03 (×2): qty 1

## 2021-06-03 MED ORDER — ONDANSETRON HCL 4 MG/2ML IJ SOLN
4.0000 mg | Freq: Four times a day (QID) | INTRAMUSCULAR | Status: DC | PRN
  Administered 2021-06-03 – 2021-06-04 (×2): 4 mg via INTRAVENOUS
  Filled 2021-06-03 (×2): qty 2

## 2021-06-03 MED ORDER — DEXTROSE 5 % IV SOLN
10.0000 mmol | Freq: Once | INTRAVENOUS | Status: AC
  Administered 2021-06-03: 14:00:00 10 mmol via INTRAVENOUS
  Filled 2021-06-03: qty 3.33

## 2021-06-03 MED ORDER — POLYETHYLENE GLYCOL 3350 17 G PO PACK: 17.0000 g | PACK | Freq: Every day | ORAL | Status: DC | PRN

## 2021-06-03 NOTE — Plan of Care (Signed)
Pt had one episode of emesis at beginning of shift. No further reports of nausea. Pt o2 decreased to [facility], holding o2 sats even when removing oxygen and sleeping. Managed chronic pain with PRN medications. Voiding with PureWick in place. VSS. Denies any further needs at this time.

## 2021-06-03 NOTE — Progress Notes (Signed)
PROGRESS NOTE    Katrina Burnett  XTK:[id number] DOB: 06/25/1924 DOA: 05/31/2021 PCP: [name], DO    Brief Narrative:  88 y.o. female with a history of depression, GERD, polymyalgia, chronic pain. Patient presented secondary to shortness of breath and confusion and found to have acute hypoxia secondary to COVID-19 infection with associated pneumonia. Patient started on Remdesivir and steroids.  Assessment & Plan:   Principal Problem:   Pneumonia due to COVID-19 virus Active Problems:   Polymyalgia ([place])   GERD (gastroesophageal reflux disease)   Depression with anxiety   Acute on chronic respiratory failure with hypoxia (HCC)  * Pneumonia due to COVID-19 virus- (present on admission) Patient started empirically on Remdesivir and Solu-medrol for associated hypoxia. Elevated CRP which is trending down. D-dimer trending down. Blood cultures with no growth to date. -Continue Remdesivir and Solu-medrol IV > now on prednisone -Inflammatory markers trending down with exception of d-dimer which is trending up to 3.29. Will check LE dopplers to r/o DVT   Depression with anxiety- (present on admission) Patient is on Depakote and Ativan as an outpatient -Continue Depakote -Continue decreased Ativan 1.5 mg daily   GERD (gastroesophageal reflux disease)- (present on admission) -Continue Protonix   Polymyalgia ([place])- (present on admission) Patient is on morphine (PRN) and Percocet (scheduled) as an outpatient. -Continue home Percocet as PRN rather than scheduled   AKI (acute kidney injury) (HCC)-resolved as of 06/01/2021, (present on admission) Resolved with IV fluids Recheck bmet in AM    DVT prophylaxis: Lovenox subq Code Status: DNR Family Communication: Pt in room, family not at bedside  Status is: Inpatient  Remains inpatient appropriate because: Severity of illness    Consultants:  Palliative Care  Procedures:    Antimicrobials: Anti-infectives (From admission,  onward)    Start     Dose/Rate Route Frequency Ordered Stop   06/01/21 1600  remdesivir 100 mg in sodium chloride 0.9 % 100 mL IVPB       See Hyperspace for full Linked Orders Report.   100 mg 200 mL/hr over 30 Minutes Intravenous Daily 05/31/21 2105 06/05/21 0959   05/31/21 2200  remdesivir 200 mg in sodium chloride 0.9% 250 mL IVPB       See Hyperspace for full Linked Orders Report.   200 mg 580 mL/hr over 30 Minutes Intravenous Once 05/31/21 2105 05/31/21 2316       Subjective: Unable to assess given mentation  Objective: Vitals:   06/02/21 1317 06/02/21 2053 06/03/21 0550 06/03/21 1235  BP: (!) 120/95 140/72 (!) 146/88 (!) 155/78  Pulse: 76 61 68 69  Resp: 20 18 19 18   Temp: 97.8 F (36.6 C) 98 F (36.7 C) 99 F (37.2 C) 98.7 F (37.1 C)  TempSrc: Oral  Oral Oral  SpO2: 95% 96% 91% 98%  Weight:      Height:        Intake/Output Summary (Last 24 hours) at 06/03/2021 1518 Last data filed at 06/03/2021 0550 Gross per 24 hour  Intake 200 ml  Output 900 ml  Net -700 ml   Filed Weights   05/31/21 1757  Weight: 48.1 kg    Examination: General exam: Arousable, laying in bed, in nad Respiratory system: Normal respiratory effort, no wheezing Cardiovascular system: regular rate, s1, s2 Gastrointestinal system: Soft, nondistended, positive BS Central nervous system: CN2-12 grossly intact, strength intact Extremities: Perfused, no clubbing Skin: Normal skin turgor, no notable skin lesions seen Psychiatry: Difficult to assess given mentation  Data Reviewed: I have  personally reviewed following labs and imaging studies  CBC: Recent Labs  Lab 05/31/21 1820 06/01/21 0358 06/02/21 0357 06/03/21 0349  WBC 10.8* 8.4 8.4 10.9*  NEUTROABS 7.8* 7.2 7.3 9.8*  HGB 13.7 12.8 11.7* 11.5*  HCT 42.4 39.7 36.0 35.2*  MCV 95.9 95.4 96.0 94.9  PLT 204 187 210 188   Basic Metabolic Panel: Recent Labs  Lab 05/31/21 1820 05/31/21 2316 06/01/21 0358 06/02/21 0357  06/03/21 0349  NA 133*  --  134* 138 139  K 4.1  --  4.2 4.5 4.0  CL 96*  --  100 103 104  CO2 27  --  26 27 27   GLUCOSE 118*  --  153* 146* 131*  BUN 20  --  20 26* 28*  CREATININE 1.55*  --  1.00 0.70 0.64  CALCIUM 9.0  --  8.8* 9.3 9.2  MG  --  2.0 2.0 2.2 2.3  PHOS  --  3.9 3.4 2.5 1.9*   GFR: Estimated Creatinine Clearance: 30.9 mL/min (by C-G formula based on SCr of 0.64 mg/dL). Liver Function Tests: Recent Labs  Lab 05/31/21 1820 06/01/21 0358 06/02/21 0357 06/03/21 0349  AST 24 20 26 22   ALT 15 14 16 16   ALKPHOS 70 62 52 51  BILITOT 0.4 0.2* 0.4 0.4  PROT 6.8 6.3* 6.3* 5.9*  ALBUMIN 3.3* 3.1* 2.9* 2.8*   No results for input(s): LIPASE, AMYLASE in the last 168 hours. No results for input(s): AMMONIA in the last 168 hours. Coagulation Profile: No results for input(s): INR, PROTIME in the last 168 hours. Cardiac Enzymes: Recent Labs  Lab 05/31/21 2316  CKTOTAL 105   BNP (last 3 results) No results for input(s): PROBNP in the last 8760 hours. HbA1C: No results for input(s): HGBA1C in the last 72 hours. CBG: No results for input(s): GLUCAP in the last 168 hours. Lipid Profile: Recent Labs    05/31/21 1820  TRIG 113   Thyroid Function Tests: Recent Labs    06/01/21 0358  TSH 0.809   Anemia Panel: Recent Labs    06/02/21 0357 06/03/21 0349  FERRITIN 511* 493*   Sepsis Labs: Recent Labs  Lab 05/31/21 1820 05/31/21 2316  PROCALCITON 0.17 0.13  LATICACIDVEN 1.3  --     Recent Results (from the past 240 hour(s))  Resp Panel by RT-PCR (Flu A&B, Covid) Nasopharyngeal Swab     Status: Abnormal   Collection Time: 05/31/21  6:20 PM   Specimen: Nasopharyngeal Swab; Nasopharyngeal(NP) swabs in vial transport medium  Result Value Ref Range Status   SARS Coronavirus 2 by RT PCR POSITIVE (A) NEGATIVE Final    Comment: (NOTE) SARS-CoV-2 target nucleic acids are DETECTED.  The SARS-CoV-2 RNA is generally detectable in upper respiratory specimens  during the acute phase of infection. Positive results are indicative of the presence of the identified virus, but do not rule out bacterial infection or co-infection with other pathogens not detected by the test. Clinical correlation with patient history and other diagnostic information is necessary to determine patient infection status. The expected result is Negative.  Fact Sheet for Patients: EntrepreneurPulse.com.au  Fact Sheet for Healthcare Providers: IncredibleEmployment.be  This test is not yet approved or cleared by the [place] FDA and  has been authorized for detection and/or diagnosis of SARS-CoV-2 by FDA under an Emergency Use Authorization (EUA).  This EUA will remain in effect (meaning this test can be used) for the duration of  the COVID-19 declaration under Section 564(b)(1) of the A  ct, 21 U.S.C. section 360bbb-3(b)(1), unless the authorization is terminated or revoked sooner.     Influenza A by PCR NEGATIVE NEGATIVE Final   Influenza B by PCR NEGATIVE NEGATIVE Final    Comment: (NOTE) The Xpert Xpress SARS-CoV-2/FLU/RSV plus assay is intended as an aid in the diagnosis of influenza from Nasopharyngeal swab specimens and should not be used as a sole basis for treatment. Nasal washings and aspirates are unacceptable for Xpert Xpress SARS-CoV-2/FLU/RSV testing.  Fact Sheet for Patients: EntrepreneurPulse.com.au  Fact Sheet for Healthcare Providers: IncredibleEmployment.be  This test is not yet approved or cleared by the [place] FDA and has been authorized for detection and/or diagnosis of SARS-CoV-2 by FDA under an Emergency Use Authorization (EUA). This EUA will remain in effect (meaning this test can be used) for the duration of the COVID-19 declaration under Section 564(b)(1) of the Act, 21 U.S.C. section 360bbb-3(b)(1), unless the authorization is terminated  or revoked.  Performed at [facility] [place] [address], [place], [place] [address]   Blood Culture (routine x 2)     Status: None (Preliminary result)   Collection Time: 05/31/21  6:20 PM   Specimen: BLOOD  Result Value Ref Range Status   Specimen Description   Final    BLOOD RIGHT WRIST Performed at [facility] [address] [place], [place] [address]    Special Requests   Final    BOTTLES DRAWN AEROBIC AND ANAEROBIC Blood Culture adequate volume Performed at [facility] [address] [place], [place] [address]    Culture   Final    NO GROWTH 2 DAYS Performed at [facility] [address], [place], [place] [address]    Report Status PENDING  Incomplete  Blood Culture (routine x 2)     Status: None (Preliminary result)   Collection Time: 05/31/21  6:28 PM   Specimen: BLOOD  Result Value Ref Range Status   Specimen Description   Final    BLOOD RIGHT HAND Performed at [facility] [address] [place], [place] [address]    Special Requests   Final    BOTTLES DRAWN AEROBIC AND ANAEROBIC Blood Culture adequate volume Performed at [facility] [address], [place], [place] [address]    Culture   Final    NO GROWTH 2 DAYS Performed at [facility] [address], [place], [place] [address]    Report Status PENDING  Incomplete     Radiology Studies: No results found.  Scheduled Meds:  albuterol  2 puff Inhalation Q6H   divalproex  125 mg Oral BID   docusate sodium  100 mg Oral Daily   dorzolamide  1 drop Both Eyes BID   [START ON 06/04/2021] enoxaparin (LOVENOX) injection  40 mg Subcutaneous Q24H   feeding supplement  237 mL Oral TID BM   latanoprost  1 drop Both Eyes QHS   LORazepam  1.5 mg Oral Daily   multivitamin with minerals  1 tablet Oral Daily   pantoprazole  40 mg Oral Daily   [START ON 06/04/2021] predniSONE  50 mg Oral Q breakfast    Continuous Infusions:  remdesivir 100 mg in NS 100 mL 100 mg (06/03/21 0958)   sodium phosphate  Dextrose 5% IVPB 10 mmol (06/03/21 1336)     LOS: 3 days   Marylu Lund, MD [facility] Pager On Amion  If 7PM-7AM, please contact night-coverage 06/03/2021, 3:18 PM

## 2021-06-03 NOTE — Progress Notes (Signed)
Bilateral lower extremity venous duplex has been completed. Preliminary results can be found in CV Proc through chart review.   06/03/21 4:03 PM Carlos Levering RVT

## 2021-06-04 DIAGNOSIS — J9621 Acute and chronic respiratory failure with hypoxia: Secondary | ICD-10-CM

## 2021-06-04 DIAGNOSIS — R627 Adult failure to thrive: Secondary | ICD-10-CM

## 2021-06-04 DIAGNOSIS — Z515 Encounter for palliative care: Secondary | ICD-10-CM

## 2021-06-04 DIAGNOSIS — Z66 Do not resuscitate: Secondary | ICD-10-CM

## 2021-06-04 DIAGNOSIS — Z7189 Other specified counseling: Secondary | ICD-10-CM

## 2021-06-04 LAB — COMPREHENSIVE METABOLIC PANEL
ALT: 15 U/L (ref 0–44)
AST: 20 U/L (ref 15–41)
Albumin: 2.7 g/dL — ABNORMAL LOW (ref 3.5–5.0)
Alkaline Phosphatase: 56 U/L (ref 38–126)
Anion gap: 8 (ref 5–15)
BUN: 26 mg/dL — ABNORMAL HIGH (ref 8–23)
CO2: 27 mmol/L (ref 22–32)
Calcium: 8.8 mg/dL — ABNORMAL LOW (ref 8.9–10.3)
Chloride: 103 mmol/L (ref 98–111)
Creatinine, Ser: 0.65 mg/dL (ref 0.44–1.00)
GFR, Estimated: >60 mL/min (ref >60)
Glucose, Bld: 106 mg/dL — ABNORMAL HIGH (ref 70–99)
Potassium: 3.4 mmol/L — ABNORMAL LOW (ref 3.5–5.1)
Sodium: 138 mmol/L (ref 135–145)
Total Bilirubin: 0.4 mg/dL (ref 0.3–1.2)
Total Protein: 5.9 g/dL — ABNORMAL LOW (ref 6.5–8.1)

## 2021-06-04 LAB — CBC WITH DIFFERENTIAL/PLATELET
Abs Immature Granulocytes: 0.08 10*3/uL — ABNORMAL HIGH (ref 0.00–0.07)
Basophils Absolute: 0 10*3/uL (ref 0.0–0.1)
Basophils Relative: 0 %
Eosinophils Absolute: 0 10*3/uL (ref 0.0–0.5)
Eosinophils Relative: 0 %
HCT: 36.8 % (ref 36.0–46.0)
Hemoglobin: 11.8 g/dL — ABNORMAL LOW (ref 12.0–15.0)
Immature Granulocytes: 1 %
Lymphocytes Relative: 12 %
Lymphs Abs: 1.2 10*3/uL (ref 0.7–4.0)
MCH: 30.5 pg (ref 26.0–34.0)
MCHC: 32.1 g/dL (ref 30.0–36.0)
MCV: 95.1 fL (ref 80.0–100.0)
Monocytes Absolute: 1.2 10*3/uL — ABNORMAL HIGH (ref 0.1–1.0)
Monocytes Relative: 12 %
Neutro Abs: 7.2 10*3/uL (ref 1.7–7.7)
Neutrophils Relative %: 75 %
Platelets: 359 10*3/uL (ref 150–400)
RBC: 3.87 MIL/uL (ref 3.87–5.11)
RDW: 12.9 % (ref 11.5–15.5)
WBC: 9.6 10*3/uL (ref 4.0–10.5)
nRBC: 0 % (ref 0.0–0.2)

## 2021-06-04 LAB — C-REACTIVE PROTEIN: CRP: 1.9 mg/dL — ABNORMAL HIGH (ref <1.0)

## 2021-06-04 LAB — MAGNESIUM: Magnesium: 2.3 mg/dL (ref 1.7–2.4)

## 2021-06-04 LAB — D-DIMER, QUANTITATIVE: D-Dimer, Quant: 3.15 ug/mL-FEU — ABNORMAL HIGH (ref 0.00–0.50)

## 2021-06-04 LAB — PHOSPHORUS: Phosphorus: 2.5 mg/dL (ref 2.5–4.6)

## 2021-06-04 LAB — FERRITIN: Ferritin: 315 ng/mL — ABNORMAL HIGH (ref 11–307)

## 2021-06-04 MED ORDER — POTASSIUM CHLORIDE CRYS ER 20 MEQ PO TBCR
40.0000 meq | EXTENDED_RELEASE_TABLET | Freq: Once | ORAL | Status: AC
  Administered 2021-06-04: 40 meq via ORAL
  Filled 2021-06-04: qty 2

## 2021-06-04 NOTE — Progress Notes (Signed)
Daily Progress Note   Patient Name: Katrina Burnett       Date: 06/04/2021 DOB: 04-09-1925  Age: 88 y.o. MRN#: [id number] Attending Physician: Donne Hazel, MD Primary Care Physician: [name], DO Admit Date: 05/31/2021  Reason for Consultation/Follow-up: Establishing goals of care  Subjective: Per RN, patient continues to be withdrawn - some complaints of pain this morning with PRNs administered - now appears to be resting comfortably. PO intake remains poor but some improved from Tuesday - ate several bites of oatmeal this AM  Length of Stay: 4  Current Medications: Scheduled Meds:   albuterol  2 puff Inhalation Q6H   divalproex  125 mg Oral BID   docusate sodium  100 mg Oral Daily   dorzolamide  1 drop Both Eyes BID   enoxaparin (LOVENOX) injection  40 mg Subcutaneous Q24H   feeding supplement  237 mL Oral TID BM   latanoprost  1 drop Both Eyes QHS   LORazepam  1.5 mg Oral Daily   multivitamin with minerals  1 tablet Oral Daily   pantoprazole  40 mg Oral Daily   predniSONE  50 mg Oral Q breakfast   Continuous Infusions:  PRN Meds: acetaminophen **OR** acetaminophen, ondansetron (ZOFRAN) IV, oxyCODONE-acetaminophen, polyethylene glycol, traZODone  Vital Signs: BP (!) 154/75 (BP Location: Right Arm)    Pulse 70    Temp 97.7 F (36.5 C) (Axillary)    Resp 16    Ht 5' (1.524 m)    Wt 48.1 kg    SpO2 98%    BMI 20.70 kg/m  SpO2: SpO2: 98 % O2 Device: O2 Device: Nasal Cannula O2 Flow Rate: O2 Flow Rate (L/min): 2 L/min  Intake/output summary:  Intake/Output Summary (Last 24 hours) at 06/04/2021 1309 Last data filed at 06/04/2021 1227 Gross per 24 hour  Intake 1604.85 ml  Output 900 ml  Net 704.85 ml   LBM: Last BM Date: 06/02/21 Baseline Weight: Weight: 48.1 kg Most recent weight:  Weight: 48.1 kg      Palliative Assessment/Data: PPS 20%    Flowsheet Rows    Flowsheet Row Most Recent Value  Intake Tab   Referral Department Hospitalist  Unit at Time of Referral ER  Palliative Care Primary Diagnosis Sepsis/Infectious Disease  Date Notified 05/31/21  Palliative Care Type New Palliative care  Reason for referral Clarify Goals of Care  Date of Admission 05/31/21  Date first seen by Palliative Care 06/02/21  # of days Palliative referral response time 2 Day(s)  # of days IP prior to Palliative referral 0  Clinical Assessment   Psychosocial & Spiritual Assessment   Palliative Care Outcomes        Patient Active Problem List   Diagnosis Date Noted   Pneumonia due to COVID-19 virus 05/31/2021   Dysphagia 12/02/2020   Protein-calorie malnutrition, severe 11/28/2020   Sepsis ([place]) 11/28/2020   Acute CHF (congestive heart failure) ([place]) 11/28/2020   Underweight 11/28/2020   Multifocal pneumonia 11/27/2020   CAP (community acquired pneumonia) 09/09/2020   Malnutrition ([place]) 09/09/2020   Chronic pain disorder 09/09/2020   Glaucoma 09/09/2020   DNR (do not resuscitate) 09/09/2020   Acute on chronic respiratory failure with hypoxia ([place])  01/24/2020   Acute hypoxemic respiratory failure ([place]) 01/23/2020   Depression with anxiety    Thrombocytosis    Esophageal mass 11/19/2019   Hiatal hernia    Esophagitis    Pelvic fracture (HCC) 11/11/2019   Acute on chronic anemia 11/25/2016   Hyponatremia 11/25/2016   Closed fracture of femur ([place])    Acute blood loss anemia    Adjustment disorder with mixed anxiety and depressed mood    Fall    Abnormality of gait    Femur fracture, right, closed, initial encounter 05/23/2015   Femoral neck fracture, right, closed, initial encounter 05/22/2015   Syncope 05/30/2012   Nausea & vomiting 05/30/2012   Chest pain 05/30/2012   Hypotension 05/30/2012   GERD (gastroesophageal reflux disease) 12/16/2011   Lumbar  compression fracture (HCC)    Polymyalgia ([place])     Palliative Care Assessment & Plan   HPI: 88 y.o. female  with past medical history of depression, GERD, polymyalgia, and chronic pain admitted on 05/31/2021 with acute hypoxia secondary to COVID-19 infection with associated pneumonia. PMT consulted to discuss [place].   Assessment: Follow up today with daughter Katrina Burnett review patient condition - review that patient remains withdrawn with poor intake though she has had some improvement. Review her complaints of pain this AM and pain medications given. Katrina Burnett discuss potential discharge soon - discuss remains to be seen what her new baseline will be, may continue with failure to thrive though will likely do better in her familiar environment with familiar caregivers. Katrina Burnett reports they plan to continue her care at her ALF with 24/7 hired caregivers. Katrina Burnett discuss additional support of hospice or palliative care - she tells me they are already connected with hospice and have someone from hospice see her multiple times a week. Katrina Burnett discuss this not documented within her chart but Katrina Burnett confirms they have hospice and tells me she spoke with the hospice agency earlier to inform them of potential discharge soon. Unclear who patient's hospice provider is, does not receive care through [facility].  Recommendations/Plan: Family looking forward to discharge soon and hopeful for improvement when patient is back in familiar environment, they understand concern for continued failure to thrive Family reports patient is receiving hospice care at her ALF and they would like to continue - they have already spoken and arranged with hospice agency - unclear who is providing hospice services Discussed continuing current symptom management regimen with ongoing follow up through hospice  Code Status: DNR  Discharge Planning: ALF with prior services (24/7 paid caregivers and hospice)  Care plan was discussed with daughter  Katrina Burnett  Thank you for allowing the Palliative Medicine Team to assist in the care of this patient.  Juel Burrow, DNP, [facility] Palliative Medicine Team Team Phone # [phone]  Pager [phone]

## 2021-06-04 NOTE — Progress Notes (Signed)
PROGRESS NOTE    Katrina Burnett  XHB:[id number] DOB: [birth year] DOA: 05/31/2021 PCP: [name], DO    Brief Narrative:  88 y.o. female with a history of depression, GERD, polymyalgia, chronic pain. Patient presented secondary to shortness of breath and confusion and found to have acute hypoxia secondary to COVID-19 infection with associated pneumonia. Patient started on Remdesivir and steroids.  Assessment & Plan:   Principal Problem:   Pneumonia due to COVID-19 virus Active Problems:   Polymyalgia ([place])   GERD (gastroesophageal reflux disease)   Depression with anxiety   Acute on chronic respiratory failure with hypoxia (HCC)  * Pneumonia due to COVID-19 virus- (present on admission) Patient started empirically on Remdesivir and Solu-medrol for associated hypoxia. Elevated CRP which is trending down. D-dimer trending down. Blood cultures with no growth to date. -Completed Remdesivir and Solu-medrol IV > now on prednisone -Inflammatory markers trending down with exception of d-dimer which is trending up to 3.29, now trending down -Reviewed LE dopplers. Neg for DVT   Depression with anxiety- (present on admission) Patient is on Depakote and Ativan as an outpatient -Continue Depakote -Continue decreased Ativan 1.5 mg daily   GERD (gastroesophageal reflux disease)- (present on admission) -Continue Protonix   Polymyalgia ([place])- (present on admission) Patient is on morphine (PRN) and Percocet (scheduled) as an outpatient. -Continue home Percocet as PRN rather than scheduled   AKI (acute kidney injury) (HCC)-resolved as of 06/01/2021, (present on admission) Resolved with IV fluids Recheck bmet in AM    DVT prophylaxis: Lovenox subq Code Status: DNR Family Communication: Pt in room, family not at bedside  Status is: Inpatient  Remains inpatient appropriate because: Severity of illness    Consultants:  Palliative Care  Procedures:    Antimicrobials: Anti-infectives  (From admission, onward)    Start     Dose/Rate Route Frequency Ordered Stop   06/01/21 1600  remdesivir 100 mg in sodium chloride 0.9 % 100 mL IVPB       See Hyperspace for full Linked Orders Report.   100 mg 200 mL/hr over 30 Minutes Intravenous Daily 05/31/21 2105 06/04/21 0933   05/31/21 2200  remdesivir 200 mg in sodium chloride 0.9% 250 mL IVPB       See Hyperspace for full Linked Orders Report.   200 mg 580 mL/hr over 30 Minutes Intravenous Once 05/31/21 2105 05/31/21 2316       Subjective: Pt complaining of headache this AM. More awake  Objective: Vitals:   06/03/21 2039 06/04/21 0411 06/04/21 0425 06/04/21 1219  BP:  (!) 141/63  (!) 154/75  Pulse:  65  70  Resp: 19 17 13 16   Temp:  98.1 F (36.7 C)  97.7 F (36.5 C)  TempSrc:  Oral  Axillary  SpO2:  94%  98%  Weight:      Height:        Intake/Output Summary (Last 24 hours) at 06/04/2021 1642 Last data filed at 06/04/2021 1536 Gross per 24 hour  Intake 947 ml  Output 900 ml  Net 47 ml    Filed Weights   05/31/21 1757  Weight: 48.1 kg    Examination: General exam: Conversant, in no acute distress Respiratory system: normal chest rise, clear, no audible wheezing Cardiovascular system: regular rhythm, s1-s2 Gastrointestinal system: Nondistended, nontender, pos BS Central nervous system: No seizures, no tremors Extremities: No cyanosis, no joint deformities Skin: No rashes, no pallor Psychiatry: Affect normal // no auditory hallucinations   Data Reviewed: I have personally reviewed  following labs and imaging studies  CBC: Recent Labs  Lab 05/31/21 1820 06/01/21 0358 06/02/21 0357 06/03/21 0349 06/04/21 0334  WBC 10.8* 8.4 8.4 10.9* 9.6  NEUTROABS 7.8* 7.2 7.3 9.8* 7.2  HGB 13.7 12.8 11.7* 11.5* 11.8*  HCT 42.4 39.7 36.0 35.2* 36.8  MCV 95.9 95.4 96.0 94.9 95.1  PLT 204 187 210 293 893    Basic Metabolic Panel: Recent Labs  Lab 05/31/21 1820 05/31/21 2316 06/01/21 0358 06/02/21 0357  06/03/21 0349 06/04/21 0334  NA 133*  --  134* 138 139 138  K 4.1  --  4.2 4.5 4.0 3.4*  CL 96*  --  100 103 104 103  CO2 27  --  26 27 27 27   GLUCOSE 118*  --  153* 146* 131* 106*  BUN 20  --  20 26* 28* 26*  CREATININE 1.55*  --  1.00 0.70 0.64 0.65  CALCIUM 9.0  --  8.8* 9.3 9.2 8.8*  MG  --  2.0 2.0 2.2 2.3 2.3  PHOS  --  3.9 3.4 2.5 1.9* 2.5    GFR: Estimated Creatinine Clearance: 30.9 mL/min (by C-G formula based on SCr of 0.65 mg/dL). Liver Function Tests: Recent Labs  Lab 05/31/21 1820 06/01/21 0358 06/02/21 0357 06/03/21 0349 06/04/21 0334  AST 24 20 26 22 20   ALT 15 14 16 16 15   ALKPHOS 70 62 52 51 56  BILITOT 0.4 0.2* 0.4 0.4 0.4  PROT 6.8 6.3* 6.3* 5.9* 5.9*  ALBUMIN 3.3* 3.1* 2.9* 2.8* 2.7*    No results for input(s): LIPASE, AMYLASE in the last 168 hours. No results for input(s): AMMONIA in the last 168 hours. Coagulation Profile: No results for input(s): INR, PROTIME in the last 168 hours. Cardiac Enzymes: Recent Labs  Lab 05/31/21 2316  CKTOTAL 105    BNP (last 3 results) No results for input(s): PROBNP in the last 8760 hours. HbA1C: No results for input(s): HGBA1C in the last 72 hours. CBG: No results for input(s): GLUCAP in the last 168 hours. Lipid Profile: No results for input(s): CHOL, HDL, LDLCALC, TRIG, CHOLHDL, LDLDIRECT in the last 72 hours.  Thyroid Function Tests: No results for input(s): TSH, T4TOTAL, FREET4, T3FREE, THYROIDAB in the last 72 hours.  Anemia Panel: Recent Labs    06/03/21 0349 06/04/21 0334  FERRITIN 493* 315*    Sepsis Labs: Recent Labs  Lab 05/31/21 1820 05/31/21 2316  PROCALCITON 0.17 0.13  LATICACIDVEN 1.3  --      Recent Results (from the past 240 hour(s))  Resp Panel by RT-PCR (Flu A&B, Covid) Nasopharyngeal Swab     Status: Abnormal   Collection Time: 05/31/21  6:20 PM   Specimen: Nasopharyngeal Swab; Nasopharyngeal(NP) swabs in vial transport medium  Result Value Ref Range Status   SARS  Coronavirus 2 by RT PCR POSITIVE (A) NEGATIVE Final    Comment: (NOTE) SARS-CoV-2 target nucleic acids are DETECTED.  The SARS-CoV-2 RNA is generally detectable in upper respiratory specimens during the acute phase of infection. Positive results are indicative of the presence of the identified virus, but do not rule out bacterial infection or co-infection with other pathogens not detected by the test. Clinical correlation with patient history and other diagnostic information is necessary to determine patient infection status. The expected result is Negative.  Fact Sheet for Patients: EntrepreneurPulse.com.au  Fact Sheet for Healthcare Providers: IncredibleEmployment.be  This test is not yet approved or cleared by the [place] FDA and  has been authorized for detection  and/or diagnosis of SARS-CoV-2 by FDA under an Emergency Use Authorization (EUA).  This EUA will remain in effect (meaning this test can be used) for the duration of  the COVID-19 declaration under Section 564(b)(1) of the A ct, 21 U.S.C. section 360bbb-3(b)(1), unless the authorization is terminated or revoked sooner.     Influenza A by PCR NEGATIVE NEGATIVE Final   Influenza B by PCR NEGATIVE NEGATIVE Final    Comment: (NOTE) The Xpert Xpress SARS-CoV-2/FLU/RSV plus assay is intended as an aid in the diagnosis of influenza from Nasopharyngeal swab specimens and should not be used as a sole basis for treatment. Nasal washings and aspirates are unacceptable for Xpert Xpress SARS-CoV-2/FLU/RSV testing.  Fact Sheet for Patients: EntrepreneurPulse.com.au  Fact Sheet for Healthcare Providers: IncredibleEmployment.be  This test is not yet approved or cleared by the [place] FDA and has been authorized for detection and/or diagnosis of SARS-CoV-2 by FDA under an Emergency Use Authorization (EUA). This EUA will remain in effect (meaning  this test can be used) for the duration of the COVID-19 declaration under Section 564(b)(1) of the Act, 21 U.S.C. section 360bbb-3(b)(1), unless the authorization is terminated or revoked.  Performed at [facility], [place] [address], [place], [place] [address]   Blood Culture (routine x 2)     Status: None (Preliminary result)   Collection Time: 05/31/21  6:20 PM   Specimen: BLOOD  Result Value Ref Range Status   Specimen Description   Final    BLOOD RIGHT WRIST Performed at [facility] [address], [place], [place] [address]    Special Requests   Final    BOTTLES DRAWN AEROBIC AND ANAEROBIC Blood Culture adequate volume Performed at [facility] [address], [place], [place] [address]    Culture   Final    NO GROWTH 3 DAYS Performed at [facility], [place] [address], [place], [place] [address]    Report Status PENDING  Incomplete  Blood Culture (routine x 2)     Status: None (Preliminary result)   Collection Time: 05/31/21  6:28 PM   Specimen: BLOOD  Result Value Ref Range Status   Specimen Description   Final    BLOOD RIGHT HAND Performed at [facility] [address] [place], [place] [address]    Special Requests   Final    BOTTLES DRAWN AEROBIC AND ANAEROBIC Blood Culture adequate volume Performed at [facility] [address], [place], [place] [address]    Culture   Final    NO GROWTH 3 DAYS Performed at [facility], [place] [address] [place], [place] [address]    Report Status PENDING  Incomplete      Radiology Studies: VAS [place] LOWER EXTREMITY VENOUS (DVT)  Result Date: 06/03/2021  Lower Venous DVT Study Patient Name:  Katrina Burnett  Date of Exam:   06/03/2021 Medical Rec #: [id number]      Accession #:    [id number] Date of Birth: 1925-03-30       Patient Gender: F Patient Age:   86 years Exam Location:  [facility] Procedure:      VAS [place]  LOWER EXTREMITY VENOUS (DVT) Referring Phys: Kamori Kitchens --------------------------------------------------------------------------------  Indications: Elevated Ddimer.  Risk Factors: COVID 19 positive. Limitations: Body habitus, poor ultrasound/tissue interface and patient positioning, patient immobility, patient pain tolerance. Comparison Study: No prior studies. Performing Technologist: Oliver Hum RVT  Examination Guidelines: A complete  evaluation includes B-mode imaging, spectral Doppler, color Doppler, and power Doppler as needed of all accessible portions of each vessel. Bilateral testing is considered an integral part of a complete examination. Limited examinations for reoccurring indications may be performed as noted. The reflux portion of the exam is performed with the patient in reverse Trendelenburg.  +---------+---------------+---------+-----------+----------+--------------+  RIGHT     Compressibility Phasicity Spontaneity Properties Thrombus Aging  +---------+---------------+---------+-----------+----------+--------------+  CFV       Full            Yes       Yes                                    +---------+---------------+---------+-----------+----------+--------------+  SFJ       Full                                                             +---------+---------------+---------+-----------+----------+--------------+  FV Prox   Full                                                             +---------+---------------+---------+-----------+----------+--------------+  FV Mid    Full                                                             +---------+---------------+---------+-----------+----------+--------------+  FV Distal Full                                                             +---------+---------------+---------+-----------+----------+--------------+  PFV       Full                                                              +---------+---------------+---------+-----------+----------+--------------+  POP       Full            Yes       Yes                                    +---------+---------------+---------+-----------+----------+--------------+  PTV       Full                                                             +---------+---------------+---------+-----------+----------+--------------+  PERO      Full                                                             +---------+---------------+---------+-----------+----------+--------------+   +---------+---------------+---------+-----------+----------+-------------------+  LEFT      Compressibility Phasicity Spontaneity Properties Thrombus Aging       +---------+---------------+---------+-----------+----------+-------------------+  CFV       Full            Yes       Yes                                         +---------+---------------+---------+-----------+----------+-------------------+  SFJ       Full                                                                  +---------+---------------+---------+-----------+----------+-------------------+  FV Prox   Full                                                                  +---------+---------------+---------+-----------+----------+-------------------+  FV Mid    Full                                                                  +---------+---------------+---------+-----------+----------+-------------------+  FV Distal                 Yes       Yes                                         +---------+---------------+---------+-----------+----------+-------------------+  PFV       Full                                                                  +---------+---------------+---------+-----------+----------+-------------------+  POP                                                        Not well visualized  +---------+---------------+---------+-----------+----------+-------------------+  PTV       Full                                                                   +---------+---------------+---------+-----------+----------+-------------------+  PERO      Full                                                                  +---------+---------------+---------+-----------+----------+-------------------+     Summary: RIGHT: - There is no evidence of deep vein thrombosis in the lower extremity. However, portions of this examination were limited- see technologist comments above.  - No cystic structure found in the popliteal fossa.  LEFT: - There is no evidence of deep vein thrombosis in the lower extremity. However, portions of this examination were limited- see technologist comments above.  - No cystic structure found in the popliteal fossa.  *See table(s) above for measurements and observations. Electronically signed by Harold Barban MD on 06/03/2021 at 9:14:44 PM.    Final     Scheduled Meds:  albuterol  2 puff Inhalation Q6H   divalproex  125 mg Oral BID   docusate sodium  100 mg Oral Daily   dorzolamide  1 drop Both Eyes BID   enoxaparin (LOVENOX) injection  40 mg Subcutaneous Q24H   feeding supplement  237 mL Oral TID BM   latanoprost  1 drop Both Eyes QHS   LORazepam  1.5 mg Oral Daily   multivitamin with minerals  1 tablet Oral Daily   pantoprazole  40 mg Oral Daily   predniSONE  50 mg Oral Q breakfast   Continuous Infusions:     LOS: 4 days   Marylu Lund, MD [facility] Pager On Amion  If 7PM-7AM, please contact night-coverage 06/04/2021, 4:42 PM

## 2021-06-05 LAB — COMPREHENSIVE METABOLIC PANEL
ALT: 17 U/L (ref 0–44)
AST: 20 U/L (ref 15–41)
Albumin: 2.9 g/dL — ABNORMAL LOW (ref 3.5–5.0)
Alkaline Phosphatase: 59 U/L (ref 38–126)
Anion gap: 8 (ref 5–15)
BUN: 22 mg/dL (ref 8–23)
CO2: 27 mmol/L (ref 22–32)
Calcium: 9.1 mg/dL (ref 8.9–10.3)
Chloride: 101 mmol/L (ref 98–111)
Creatinine, Ser: 0.7 mg/dL (ref 0.44–1.00)
GFR, Estimated: >60 mL/min (ref >60)
Glucose, Bld: 103 mg/dL — ABNORMAL HIGH (ref 70–99)
Potassium: 3.9 mmol/L (ref 3.5–5.1)
Sodium: 136 mmol/L (ref 135–145)
Total Bilirubin: 0.4 mg/dL (ref 0.3–1.2)
Total Protein: 5.8 g/dL — ABNORMAL LOW (ref 6.5–8.1)

## 2021-06-05 LAB — CBC WITH DIFFERENTIAL/PLATELET
Abs Immature Granulocytes: 0.14 10*3/uL — ABNORMAL HIGH (ref 0.00–0.07)
Basophils Absolute: 0 10*3/uL (ref 0.0–0.1)
Basophils Relative: 0 %
Eosinophils Absolute: 0 10*3/uL (ref 0.0–0.5)
Eosinophils Relative: 0 %
HCT: 38 % (ref 36.0–46.0)
Hemoglobin: 12.3 g/dL (ref 12.0–15.0)
Immature Granulocytes: 2 %
Lymphocytes Relative: 16 %
Lymphs Abs: 1.5 10*3/uL (ref 0.7–4.0)
MCH: 30.6 pg (ref 26.0–34.0)
MCHC: 32.4 g/dL (ref 30.0–36.0)
MCV: 94.5 fL (ref 80.0–100.0)
Monocytes Absolute: 1.4 10*3/uL — ABNORMAL HIGH (ref 0.1–1.0)
Monocytes Relative: 14 %
Neutro Abs: 6.6 10*3/uL (ref 1.7–7.7)
Neutrophils Relative %: 68 %
Platelets: 426 10*3/uL — ABNORMAL HIGH (ref 150–400)
RBC: 4.02 MIL/uL (ref 3.87–5.11)
RDW: 12.6 % (ref 11.5–15.5)
WBC: 9.6 10*3/uL (ref 4.0–10.5)
nRBC: 0 % (ref 0.0–0.2)

## 2021-06-05 LAB — MAGNESIUM: Magnesium: 2.3 mg/dL (ref 1.7–2.4)

## 2021-06-05 LAB — FERRITIN: Ferritin: 280 ng/mL (ref 11–307)

## 2021-06-05 LAB — PHOSPHORUS: Phosphorus: 1.8 mg/dL — ABNORMAL LOW (ref 2.5–4.6)

## 2021-06-05 LAB — C-REACTIVE PROTEIN: CRP: 1.1 mg/dL — ABNORMAL HIGH (ref <1.0)

## 2021-06-05 LAB — D-DIMER, QUANTITATIVE: D-Dimer, Quant: 2.67 ug/mL-FEU — ABNORMAL HIGH (ref 0.00–0.50)

## 2021-06-05 MED ORDER — PREDNISONE 10 MG PO TABS: 40.0000 mg | ORAL_TABLET | Freq: Every day | ORAL | 0 refills | Status: AC

## 2021-06-05 NOTE — Plan of Care (Signed)
°  Problem: Education: Goal: Knowledge of risk factors and measures for prevention of condition will improve Outcome: Progressing   Problem: Respiratory: Goal: Will maintain a patent airway Outcome: Progressing   Problem: Education: Goal: Knowledge of General Education information will improve Description: Including pain rating scale, medication(s)/side effects and non-pharmacologic comfort measures Outcome: Progressing   

## 2021-06-05 NOTE — TOC Progression Note (Signed)
Transition of Care [facility]) - Progression Note    Patient Details  Name: Katrina Burnett MRN: [id number] Date of Birth: [birth year]  Transition of Care [facility]) CM/SW Contact  Purcell Mouton, RN Phone Number: 06/05/2021, 12:38 PM  Clinical Narrative:    [name] was called for transportation. Daughter [name] was also called to make aware that [facility] was called.     Expected Discharge Plan: Home/Self Care Barriers to Discharge: Continued Medical Work up  Expected Discharge Plan and Services Expected Discharge Plan: Home/Self Care   Discharge Planning Services: CM Consult   Living arrangements for the past 2 months: Assisted Living Facility                                       Social Determinants of Health (SDOH) Interventions    Readmission Risk Interventions Readmission Risk Prevention Plan 12/02/2020  Transportation Screening Complete  PCP or Specialist Appt within 5-7 Days Complete  Home Care Screening Complete  Medication Review (RN CM) Complete  Some recent data might be hidden

## 2021-06-05 NOTE — Discharge Summary (Signed)
Physician Discharge Summary  KRYSTALYN KUBOTA FIE:[id number] DOB: 09-Nov-1924 DOA: 05/31/2021  PCP: [name], DO  Admit date: 05/31/2021 Discharge date: 06/05/2021  Admitted From: ALF Disposition:  ALF  Recommendations for Outpatient Follow-up:  Follow up with PCP in 1-2 weeks Continue hospice care at ALF  Discharge Condition:Stable CODE STATUS:DNR Diet recommendation: Regular   Brief/Interim Summary: 88 y.o. female with a history of depression, GERD, polymyalgia, chronic pain. Patient presented secondary to shortness of breath and confusion and found to have acute hypoxia secondary to COVID-19 infection with associated pneumonia. Patient started on Remdesivir and steroids.  Discharge Diagnoses:  Principal Problem:   Pneumonia due to COVID-19 virus Active Problems:   Polymyalgia ([place])   GERD (gastroesophageal reflux disease)   Depression with anxiety   Acute on chronic respiratory failure with hypoxia (HCC)  * Pneumonia due to COVID-19 virus- (present on admission) Patient started empirically on Remdesivir and Solu-medrol for associated hypoxia. Elevated CRP which is trending down. D-dimer trending down. Blood cultures with no growth to date. -Completed Remdesivir and Solu-medrol IV > now on prednisone to complete total 10 days of steroids -Inflammatory markers trending down with exception of d-dimer which is trending up to 3.29, now trending down -Reviewed LE dopplers. Neg for DVT   Depression with anxiety- (present on admission) Patient is on Depakote and Ativan as an outpatient -Continue Depakote -Continued on steroids   GERD (gastroesophageal reflux disease)- (present on admission) -Continue Protonix   Polymyalgia ([place])- (present on admission) Patient is on morphine (PRN) and Percocet (scheduled) as an outpatient. -Continue home Percocet as PRN rather than scheduled   AKI (acute kidney injury) (HCC)-resolved as of 06/01/2021, (present on admission) Resolved with IV  fluids   Discharge Instructions   Allergies as of 06/05/2021       Reactions   Minocycline Nausea And Vomiting   Paxil [paroxetine Hcl] Nausea And Vomiting   Sulfa Antibiotics Nausea And Vomiting   Amoxicillin Other (See Comments)   Doesn't remember reaction other than sore mouth Tolerates cefepime   Bupropion Hcl Er (sr)    Other reaction(s): fatigue   Cyclobenzaprine    Other reaction(s): dry mouth   Mirtazapine Other (See Comments)   Doesn't remember  Other reaction(s): jittery   Vilazodone Hcl Other (See Comments)   Doesn't remember  Other reaction(s): dizziness   Avelox [moxifloxacin Hcl In Nacl] Other (See Comments)   Felt bad   Cefuroxime Diarrhea   Other reaction(s): severe diarrhea   Clarithromycin Nausea Only   Other reaction(s): stomach pain   Levaquin [levofloxacin In D5w] Diarrhea   Nystatin-triamcinolone Rash   Penicillins Rash   Prednisone Nausea Only   Other reaction(s): sick        Medication List     STOP taking these medications    pantoprazole 40 MG tablet Commonly known as: Protonix       TAKE these medications    albuterol 108 (90 Base) MCG/ACT inhaler Commonly known as: VENTOLIN HFA Inhale 2 puffs into the lungs every 6 (six) hours as needed for wheezing or shortness of breath.   cyclobenzaprine 10 MG tablet Commonly known as: FLEXERIL Take 10 mg by mouth 3 (three) times daily as needed for muscle spasms.   Diphenhist 25 mg capsule Generic drug: diphenhydrAMINE Take 25 mg by mouth every 6 (six) hours as needed for sleep or allergies.   divalproex 125 MG capsule Commonly known as: DEPAKOTE SPRINKLE Take 125 mg by mouth 2 (two) times daily.   dorzolamide 2 %  ophthalmic solution Commonly known as: TRUSOPT Place 1 drop into both eyes 2 (two) times daily.   ferrous sulfate 325 (65 FE) MG tablet Take 1 tablet (325 mg total) by mouth 2 (two) times daily with a meal.   gabapentin 300 MG capsule Commonly known as:  NEURONTIN Take 1 capsule (300 mg total) by mouth at bedtime.   hyoscyamine 0.125 MG Tbdp disintergrating tablet Commonly known as: ANASPAZ Place 0.125 mg under the tongue every 4 (four) hours as needed for cramping or bladder spasms.   latanoprost 0.005 % ophthalmic solution Commonly known as: XALATAN Place 1 drop into both eyes at bedtime.   loratadine 10 MG tablet Commonly known as: CLARITIN Take 10 mg by mouth at bedtime.   LORazepam 0.5 MG tablet Commonly known as: ATIVAN Take 1.5 mg by mouth in the morning, at noon, and at bedtime.   morphine 20 MG/5ML solution Take 2 mg by mouth every 4 (four) hours as needed for pain.   omeprazole 40 MG capsule Commonly known as: PRILOSEC Take 40 mg by mouth daily.   oxyCODONE-acetaminophen 5-325 MG tablet Commonly known as: PERCOCET/ROXICET Take 1-2 tablets by mouth every 4 (four) hours.   predniSONE 10 MG tablet Commonly known as: DELTASONE Take 4 tablets (40 mg total) by mouth daily for 5 days.   sennosides-docusate sodium 8.6-50 MG tablet Commonly known as: SENOKOT-S Take 1 tablet by mouth daily.   traZODone 50 MG tablet Commonly known as: DESYREL Take 0.5 tablets (25 mg total) by mouth at bedtime as needed for sleep. What changed: when to take this        Follow-up Information     [name], DO Follow up in 2 week(s).   Specialty: Family Medicine Why: Hospital follow up Contact information: [address] [place] [address] [phone]                Allergies  Allergen Reactions   Minocycline Nausea And Vomiting   Paxil [Paroxetine Hcl] Nausea And Vomiting   Sulfa Antibiotics Nausea And Vomiting   Amoxicillin Other (See Comments)    Doesn't remember reaction other than sore mouth Tolerates cefepime   Bupropion Hcl Er (Sr)     Other reaction(s): fatigue   Cyclobenzaprine     Other reaction(s): dry mouth   Mirtazapine Other (See Comments)    Doesn't remember  Other  reaction(s): jittery   Vilazodone Hcl Other (See Comments)    Doesn't remember  Other reaction(s): dizziness   Avelox [Moxifloxacin Hcl In Nacl] Other (See Comments)    Felt bad   Cefuroxime Diarrhea    Other reaction(s): severe diarrhea   Clarithromycin Nausea Only    Other reaction(s): stomach pain   Levaquin [Levofloxacin In D5w] Diarrhea   Nystatin-Triamcinolone Rash   Penicillins Rash   Prednisone Nausea Only    Other reaction(s): sick    Consultations: Palliative Care  Procedures/Studies: DG Chest Port 1 View  Result Date: 05/31/2021 CLINICAL DATA:  COVID, hypoxia, cough EXAM: PORTABLE CHEST 1 VIEW COMPARISON:  11/29/2020 FINDINGS: Cardiomegaly, tortuous, calcified aorta. Patchy opacity in the right lower lobe. Left lung clear. No effusions or acute bony abnormality. IMPRESSION: Patchy opacity in the right lower lobe, similar to prior study. This could reflect recurrent infiltrate or residual scarring. Cardiomegaly. Electronically Signed   By: Rolm Baptise M.D.   On: 05/31/2021 18:42   VAS [place] LOWER EXTREMITY VENOUS (DVT)  Result Date: 06/03/2021  Lower Venous DVT Study Patient Name:  JENIFFER CULLIVER  Joy  Date of Exam:   06/03/2021 Medical Rec #: [id number]      Accession #:    [id number] Date of Birth: [birth year]       Patient Gender: F Patient Age:   39 years Exam Location:  [facility] Procedure:      VAS [place] LOWER EXTREMITY VENOUS (DVT) Referring Phys: Duke Weisensel --------------------------------------------------------------------------------  Indications: Elevated Ddimer.  Risk Factors: COVID 19 positive. Limitations: Body habitus, poor ultrasound/tissue interface and patient positioning, patient immobility, patient pain tolerance. Comparison Study: No prior studies. Performing Technologist: Oliver Hum RVT  Examination Guidelines: A complete evaluation includes B-mode imaging, spectral Doppler, color Doppler, and power Doppler as needed of all accessible portions of each  vessel. Bilateral testing is considered an integral part of a complete examination. Limited examinations for reoccurring indications may be performed as noted. The reflux portion of the exam is performed with the patient in reverse Trendelenburg.  +---------+---------------+---------+-----------+----------+--------------+  RIGHT     Compressibility Phasicity Spontaneity Properties Thrombus Aging  +---------+---------------+---------+-----------+----------+--------------+  CFV       Full            Yes       Yes                                    +---------+---------------+---------+-----------+----------+--------------+  SFJ       Full                                                             +---------+---------------+---------+-----------+----------+--------------+  FV Prox   Full                                                             +---------+---------------+---------+-----------+----------+--------------+  FV Mid    Full                                                             +---------+---------------+---------+-----------+----------+--------------+  FV Distal Full                                                             +---------+---------------+---------+-----------+----------+--------------+  PFV       Full                                                             +---------+---------------+---------+-----------+----------+--------------+  POP       Full  Yes       Yes                                    +---------+---------------+---------+-----------+----------+--------------+  PTV       Full                                                             +---------+---------------+---------+-----------+----------+--------------+  PERO      Full                                                             +---------+---------------+---------+-----------+----------+--------------+   +---------+---------------+---------+-----------+----------+-------------------+  LEFT       Compressibility Phasicity Spontaneity Properties Thrombus Aging       +---------+---------------+---------+-----------+----------+-------------------+  CFV       Full            Yes       Yes                                         +---------+---------------+---------+-----------+----------+-------------------+  SFJ       Full                                                                  +---------+---------------+---------+-----------+----------+-------------------+  FV Prox   Full                                                                  +---------+---------------+---------+-----------+----------+-------------------+  FV Mid    Full                                                                  +---------+---------------+---------+-----------+----------+-------------------+  FV Distal                 Yes       Yes                                         +---------+---------------+---------+-----------+----------+-------------------+  PFV       Full                                                                  +---------+---------------+---------+-----------+----------+-------------------+  POP                                                        Not well visualized  +---------+---------------+---------+-----------+----------+-------------------+  PTV       Full                                                                  +---------+---------------+---------+-----------+----------+-------------------+  PERO      Full                                                                  +---------+---------------+---------+-----------+----------+-------------------+     Summary: RIGHT: - There is no evidence of deep vein thrombosis in the lower extremity. However, portions of this examination were limited- see technologist comments above.  - No cystic structure found in the popliteal fossa.  LEFT: - There is no evidence of deep vein thrombosis in the lower extremity. However, portions of this  examination were limited- see technologist comments above.  - No cystic structure found in the popliteal fossa.  *See table(s) above for measurements and observations. Electronically signed by Harold Barban MD on 06/03/2021 at 9:14:44 PM.    Final     Subjective: Without complaints  Discharge Exam: Vitals:   06/05/21 0529 06/05/21 0800  BP: (!) 172/92   Pulse: 62   Resp: 19 12  Temp:    SpO2: 96%    Vitals:   06/04/21 1219 06/04/21 2103 06/05/21 0529 06/05/21 0800  BP: (!) 154/75 (!) 159/94 (!) 172/92   Pulse: 70 70 62   Resp: 16  19 12   Temp: 97.7 F (36.5 C) 97.7 F (36.5 C)    TempSrc: Axillary Oral    SpO2: 98%  96%   Weight:      Height:        General: Pt is alert, awake, not in acute distress Cardiovascular: RRR, S1/S2 + Respiratory: CTA bilaterally, no wheezing, no rhonchi Abdominal: Soft, NT, ND, bowel sounds + Extremities: no edema, no cyanosis   The results of significant diagnostics from this hospitalization (including imaging, microbiology, ancillary and laboratory) are listed below for reference.     Microbiology: Recent Results (from the past 240 hour(s))  Resp Panel by RT-PCR (Flu A&B, Covid) Nasopharyngeal Swab     Status: Abnormal   Collection Time: 05/31/21  6:20 PM   Specimen: Nasopharyngeal Swab; Nasopharyngeal(NP) swabs in vial transport medium  Result Value Ref Range Status   SARS Coronavirus 2 by RT PCR POSITIVE (A) NEGATIVE Final    Comment: (NOTE) SARS-CoV-2 target nucleic acids are DETECTED.  The SARS-CoV-2 RNA is generally detectable in upper respiratory specimens during the acute phase of infection. Positive results are indicative of the presence of the identified virus, but do not rule out bacterial infection or co-infection with other pathogens not detected by the test. Clinical correlation with patient history  and other diagnostic information is necessary to determine patient infection status. The expected result is  Negative.  Fact Sheet for Patients: EntrepreneurPulse.com.au  Fact Sheet for Healthcare Providers: IncredibleEmployment.be  This test is not yet approved or cleared by the [place] FDA and  has been authorized for detection and/or diagnosis of SARS-CoV-2 by FDA under an Emergency Use Authorization (EUA).  This EUA will remain in effect (meaning this test can be used) for the duration of  the COVID-19 declaration under Section 564(b)(1) of the A ct, 21 U.S.C. section 360bbb-3(b)(1), unless the authorization is terminated or revoked sooner.     Influenza A by PCR NEGATIVE NEGATIVE Final   Influenza B by PCR NEGATIVE NEGATIVE Final    Comment: (NOTE) The Xpert Xpress SARS-CoV-2/FLU/RSV plus assay is intended as an aid in the diagnosis of influenza from Nasopharyngeal swab specimens and should not be used as a sole basis for treatment. Nasal washings and aspirates are unacceptable for Xpert Xpress SARS-CoV-2/FLU/RSV testing.  Fact Sheet for Patients: EntrepreneurPulse.com.au  Fact Sheet for Healthcare Providers: IncredibleEmployment.be  This test is not yet approved or cleared by the [place] FDA and has been authorized for detection and/or diagnosis of SARS-CoV-2 by FDA under an Emergency Use Authorization (EUA). This EUA will remain in effect (meaning this test can be used) for the duration of the COVID-19 declaration under Section 564(b)(1) of the Act, 21 U.S.C. section 360bbb-3(b)(1), unless the authorization is terminated or revoked.  Performed at [facility], [place] [address], [place], [place] [address]   Blood Culture (routine x 2)     Status: None (Preliminary result)   Collection Time: 05/31/21  6:20 PM   Specimen: BLOOD  Result Value Ref Range Status   Specimen Description   Final    BLOOD RIGHT WRIST Performed at [facility] [address] [place], [place] [address]    Special Requests   Final    BOTTLES DRAWN AEROBIC AND ANAEROBIC Blood Culture adequate volume Performed at [facility] [address] [place], [place] [address]    Culture   Final    NO GROWTH 4 DAYS Performed at [facility], [place] [address], [place], [place] [address]    Report Status PENDING  Incomplete  Blood Culture (routine x 2)     Status: None (Preliminary result)   Collection Time: 05/31/21  6:28 PM   Specimen: BLOOD  Result Value Ref Range Status   Specimen Description   Final    BLOOD RIGHT HAND Performed at [facility] [address] [place], [place] [address]    Special Requests   Final    BOTTLES DRAWN AEROBIC AND ANAEROBIC Blood Culture adequate volume Performed at [facility] [address], [place], [place] [address]    Culture   Final    NO GROWTH 4 DAYS Performed at [facility], [place] [address], [place], [place] [address]    Report Status PENDING  Incomplete     Labs: BNP (last 3 results) Recent Labs    09/09/20 0800 11/28/20 1638  BNP 222.2* 654.6*   Basic Metabolic Panel: Recent Labs  Lab 06/01/21 0358 06/02/21 0357 06/03/21 0349 06/04/21 0334 06/05/21 0349  NA 134* 138 139 138 136  K 4.2 4.5 4.0 3.4* 3.9  CL 100 103 104 103 101  CO2 26 27 27 27 27   GLUCOSE 153* 146* 131* 106* 103*  BUN 20 26* 28*  26* 22  CREATININE 1.00 0.70 0.64 0.65 0.70  CALCIUM 8.8* 9.3 9.2 8.8* 9.1  MG 2.0 2.2 2.3 2.3 2.3  PHOS 3.4 2.5 1.9* 2.5 1.8*   Liver Function Tests: Recent Labs  Lab 06/01/21 0358 06/02/21 0357 06/03/21 0349 06/04/21 0334 06/05/21 0349  AST 20 26 22 20 20   ALT 14 16 16 15 17   ALKPHOS 62 52 51 56 59  BILITOT 0.2* 0.4 0.4 0.4 0.4  PROT 6.3* 6.3* 5.9* 5.9* 5.8*  ALBUMIN 3.1* 2.9* 2.8* 2.7* 2.9*   No results for input(s): LIPASE, AMYLASE in the last 168 hours. No results for input(s): AMMONIA in the last 168 hours. CBC: Recent  Labs  Lab 06/01/21 0358 06/02/21 0357 06/03/21 0349 06/04/21 0334 06/05/21 0349  WBC 8.4 8.4 10.9* 9.6 9.6  NEUTROABS 7.2 7.3 9.8* 7.2 6.6  HGB 12.8 11.7* 11.5* 11.8* 12.3  HCT 39.7 36.0 35.2* 36.8 38.0  MCV 95.4 96.0 94.9 95.1 94.5  PLT 187 210 293 359 426*   Cardiac Enzymes: Recent Labs  Lab 05/31/21 2316  CKTOTAL 105   BNP: Invalid input(s): POCBNP CBG: No results for input(s): GLUCAP in the last 168 hours. D-Dimer Recent Labs    06/04/21 0334 06/05/21 0349  DDIMER 3.15* 2.67*   Hgb A1c No results for input(s): HGBA1C in the last 72 hours. Lipid Profile No results for input(s): CHOL, HDL, LDLCALC, TRIG, CHOLHDL, LDLDIRECT in the last 72 hours. Thyroid function studies No results for input(s): TSH, T4TOTAL, T3FREE, THYROIDAB in the last 72 hours.  Invalid input(s): FREET3 Anemia work up Recent Labs    06/04/21 0334 06/05/21 0349  FERRITIN 315* 280   Urinalysis    Component Value Date/Time   COLORURINE YELLOW 11/27/2020 1848   APPEARANCEUR HAZY (A) 11/27/2020 1848   LABSPEC 1.023 11/27/2020 1848   PHURINE 5.0 11/27/2020 1848   GLUCOSEU NEGATIVE 11/27/2020 1848   HGBUR NEGATIVE 11/27/2020 1848   BILIRUBINUR NEGATIVE 11/27/2020 1848   KETONESUR 5 (A) 11/27/2020 1848   PROTEINUR 30 (A) 11/27/2020 1848   UROBILINOGEN 0.2 05/24/2013 1634   NITRITE NEGATIVE 11/27/2020 1848   LEUKOCYTESUR NEGATIVE 11/27/2020 1848   Sepsis Labs Invalid input(s): PROCALCITONIN,  WBC,  LACTICIDVEN Microbiology Recent Results (from the past 240 hour(s))  Resp Panel by RT-PCR (Flu A&B, Covid) Nasopharyngeal Swab     Status: Abnormal   Collection Time: 05/31/21  6:20 PM   Specimen: Nasopharyngeal Swab; Nasopharyngeal(NP) swabs in vial transport medium  Result Value Ref Range Status   SARS Coronavirus 2 by RT PCR POSITIVE (A) NEGATIVE Final    Comment: (NOTE) SARS-CoV-2 target nucleic acids are DETECTED.  The SARS-CoV-2 RNA is generally detectable in upper  respiratory specimens during the acute phase of infection. Positive results are indicative of the presence of the identified virus, but do not rule out bacterial infection or co-infection with other pathogens not detected by the test. Clinical correlation with patient history and other diagnostic information is necessary to determine patient infection status. The expected result is Negative.  Fact Sheet for Patients: EntrepreneurPulse.com.au  Fact Sheet for Healthcare Providers: IncredibleEmployment.be  This test is not yet approved or cleared by the [place] FDA and  has been authorized for detection and/or diagnosis of SARS-CoV-2 by FDA under an Emergency Use Authorization (EUA).  This EUA will remain in effect (meaning this test can be used) for the duration of  the COVID-19 declaration under Section 564(b)(1) of the A ct, 21 U.S.C. section 360bbb-3(b)(1), unless the authorization is terminated  or revoked sooner.     Influenza A by PCR NEGATIVE NEGATIVE Final   Influenza B by PCR NEGATIVE NEGATIVE Final    Comment: (NOTE) The Xpert Xpress SARS-CoV-2/FLU/RSV plus assay is intended as an aid in the diagnosis of influenza from Nasopharyngeal swab specimens and should not be used as a sole basis for treatment. Nasal washings and aspirates are unacceptable for Xpert Xpress SARS-CoV-2/FLU/RSV testing.  Fact Sheet for Patients: EntrepreneurPulse.com.au  Fact Sheet for Healthcare Providers: IncredibleEmployment.be  This test is not yet approved or cleared by the [place] FDA and has been authorized for detection and/or diagnosis of SARS-CoV-2 by FDA under an Emergency Use Authorization (EUA). This EUA will remain in effect (meaning this test can be used) for the duration of the COVID-19 declaration under Section 564(b)(1) of the Act, 21 U.S.C. section 360bbb-3(b)(1), unless the authorization is  terminated or revoked.  Performed at [facility], [place] [address] [place], [place] [address]   Blood Culture (routine x 2)     Status: None (Preliminary result)   Collection Time: 05/31/21  6:20 PM   Specimen: BLOOD  Result Value Ref Range Status   Specimen Description   Final    BLOOD RIGHT WRIST Performed at [facility] [address], [place], [place] [address]    Special Requests   Final    BOTTLES DRAWN AEROBIC AND ANAEROBIC Blood Culture adequate volume Performed at [facility] [address], [place], [place] [address]    Culture   Final    NO GROWTH 4 DAYS Performed at [facility], [place] [address], [place], [place] [address]    Report Status PENDING  Incomplete  Blood Culture (routine x 2)     Status: None (Preliminary result)   Collection Time: 05/31/21  6:28 PM   Specimen: BLOOD  Result Value Ref Range Status   Specimen Description   Final    BLOOD RIGHT HAND Performed at [facility] [address], [place], [place] [address]    Special Requests   Final    BOTTLES DRAWN AEROBIC AND ANAEROBIC Blood Culture adequate volume Performed at [facility] [address] [place], [place] [address]    Culture   Final    NO GROWTH 4 DAYS Performed at [facility], [place] [address] [place], [place] [address]    Report Status PENDING  Incomplete   Time spent: 72min  SIGNED:   Marylu Lund, MD  [facility] 06/05/2021, 11:55 AM  If 7PM-7AM, please contact night-coverage

## 2021-06-06 DIAGNOSIS — M810 Age-related osteoporosis without current pathological fracture: Secondary | ICD-10-CM | POA: Diagnosis not present

## 2021-06-06 DIAGNOSIS — F419 Anxiety disorder, unspecified: Secondary | ICD-10-CM | POA: Diagnosis not present

## 2021-06-06 DIAGNOSIS — F32A Depression, unspecified: Secondary | ICD-10-CM | POA: Diagnosis not present

## 2021-06-06 DIAGNOSIS — G894 Chronic pain syndrome: Secondary | ICD-10-CM | POA: Diagnosis not present

## 2021-06-06 DIAGNOSIS — J441 Chronic obstructive pulmonary disease with (acute) exacerbation: Secondary | ICD-10-CM | POA: Diagnosis not present

## 2021-06-06 DIAGNOSIS — K219 Gastro-esophageal reflux disease without esophagitis: Secondary | ICD-10-CM | POA: Diagnosis not present

## 2021-06-06 LAB — CULTURE, BLOOD (ROUTINE X 2)
Culture: NO GROWTH
Culture: NO GROWTH
Special Requests: ADEQUATE
Special Requests: ADEQUATE

## 2021-06-08 DIAGNOSIS — M810 Age-related osteoporosis without current pathological fracture: Secondary | ICD-10-CM | POA: Diagnosis not present

## 2021-06-08 DIAGNOSIS — F32A Depression, unspecified: Secondary | ICD-10-CM | POA: Diagnosis not present

## 2021-06-08 DIAGNOSIS — G894 Chronic pain syndrome: Secondary | ICD-10-CM | POA: Diagnosis not present

## 2021-06-08 DIAGNOSIS — F419 Anxiety disorder, unspecified: Secondary | ICD-10-CM | POA: Diagnosis not present

## 2021-06-08 DIAGNOSIS — J441 Chronic obstructive pulmonary disease with (acute) exacerbation: Secondary | ICD-10-CM | POA: Diagnosis not present

## 2021-06-08 DIAGNOSIS — K219 Gastro-esophageal reflux disease without esophagitis: Secondary | ICD-10-CM | POA: Diagnosis not present

## 2021-06-09 DIAGNOSIS — K219 Gastro-esophageal reflux disease without esophagitis: Secondary | ICD-10-CM | POA: Diagnosis not present

## 2021-06-09 DIAGNOSIS — M810 Age-related osteoporosis without current pathological fracture: Secondary | ICD-10-CM | POA: Diagnosis not present

## 2021-06-09 DIAGNOSIS — G894 Chronic pain syndrome: Secondary | ICD-10-CM | POA: Diagnosis not present

## 2021-06-09 DIAGNOSIS — F32A Depression, unspecified: Secondary | ICD-10-CM | POA: Diagnosis not present

## 2021-06-09 DIAGNOSIS — J441 Chronic obstructive pulmonary disease with (acute) exacerbation: Secondary | ICD-10-CM | POA: Diagnosis not present

## 2021-06-09 DIAGNOSIS — F419 Anxiety disorder, unspecified: Secondary | ICD-10-CM | POA: Diagnosis not present

## 2021-06-10 DIAGNOSIS — K219 Gastro-esophageal reflux disease without esophagitis: Secondary | ICD-10-CM | POA: Diagnosis not present

## 2021-06-10 DIAGNOSIS — Z515 Encounter for palliative care: Secondary | ICD-10-CM | POA: Diagnosis not present

## 2021-06-10 DIAGNOSIS — F419 Anxiety disorder, unspecified: Secondary | ICD-10-CM | POA: Diagnosis not present

## 2021-06-10 DIAGNOSIS — G894 Chronic pain syndrome: Secondary | ICD-10-CM | POA: Diagnosis not present

## 2021-06-10 DIAGNOSIS — Z8701 Personal history of pneumonia (recurrent): Secondary | ICD-10-CM | POA: Diagnosis not present

## 2021-06-10 DIAGNOSIS — J441 Chronic obstructive pulmonary disease with (acute) exacerbation: Secondary | ICD-10-CM | POA: Diagnosis not present

## 2021-06-10 DIAGNOSIS — M810 Age-related osteoporosis without current pathological fracture: Secondary | ICD-10-CM | POA: Diagnosis not present

## 2021-06-10 DIAGNOSIS — F32A Depression, unspecified: Secondary | ICD-10-CM | POA: Diagnosis not present

## 2021-06-11 DIAGNOSIS — F419 Anxiety disorder, unspecified: Secondary | ICD-10-CM | POA: Diagnosis not present

## 2021-06-11 DIAGNOSIS — J441 Chronic obstructive pulmonary disease with (acute) exacerbation: Secondary | ICD-10-CM | POA: Diagnosis not present

## 2021-06-11 DIAGNOSIS — M810 Age-related osteoporosis without current pathological fracture: Secondary | ICD-10-CM | POA: Diagnosis not present

## 2021-06-11 DIAGNOSIS — K219 Gastro-esophageal reflux disease without esophagitis: Secondary | ICD-10-CM | POA: Diagnosis not present

## 2021-06-11 DIAGNOSIS — F32A Depression, unspecified: Secondary | ICD-10-CM | POA: Diagnosis not present

## 2021-06-11 DIAGNOSIS — G894 Chronic pain syndrome: Secondary | ICD-10-CM | POA: Diagnosis not present

## 2021-06-12 DIAGNOSIS — F32A Depression, unspecified: Secondary | ICD-10-CM | POA: Diagnosis not present

## 2021-06-12 DIAGNOSIS — K219 Gastro-esophageal reflux disease without esophagitis: Secondary | ICD-10-CM | POA: Diagnosis not present

## 2021-06-12 DIAGNOSIS — M810 Age-related osteoporosis without current pathological fracture: Secondary | ICD-10-CM | POA: Diagnosis not present

## 2021-06-12 DIAGNOSIS — F419 Anxiety disorder, unspecified: Secondary | ICD-10-CM | POA: Diagnosis not present

## 2021-06-12 DIAGNOSIS — J441 Chronic obstructive pulmonary disease with (acute) exacerbation: Secondary | ICD-10-CM | POA: Diagnosis not present

## 2021-06-12 DIAGNOSIS — G894 Chronic pain syndrome: Secondary | ICD-10-CM | POA: Diagnosis not present

## 2021-06-13 DIAGNOSIS — M810 Age-related osteoporosis without current pathological fracture: Secondary | ICD-10-CM | POA: Diagnosis not present

## 2021-06-13 DIAGNOSIS — F32A Depression, unspecified: Secondary | ICD-10-CM | POA: Diagnosis not present

## 2021-06-13 DIAGNOSIS — K219 Gastro-esophageal reflux disease without esophagitis: Secondary | ICD-10-CM | POA: Diagnosis not present

## 2021-06-13 DIAGNOSIS — J441 Chronic obstructive pulmonary disease with (acute) exacerbation: Secondary | ICD-10-CM | POA: Diagnosis not present

## 2021-06-13 DIAGNOSIS — G894 Chronic pain syndrome: Secondary | ICD-10-CM | POA: Diagnosis not present

## 2021-06-13 DIAGNOSIS — F419 Anxiety disorder, unspecified: Secondary | ICD-10-CM | POA: Diagnosis not present

## 2021-06-14 DIAGNOSIS — G894 Chronic pain syndrome: Secondary | ICD-10-CM | POA: Diagnosis not present

## 2021-06-14 DIAGNOSIS — F32A Depression, unspecified: Secondary | ICD-10-CM | POA: Diagnosis not present

## 2021-06-14 DIAGNOSIS — M810 Age-related osteoporosis without current pathological fracture: Secondary | ICD-10-CM | POA: Diagnosis not present

## 2021-06-14 DIAGNOSIS — J441 Chronic obstructive pulmonary disease with (acute) exacerbation: Secondary | ICD-10-CM | POA: Diagnosis not present

## 2021-06-14 DIAGNOSIS — F419 Anxiety disorder, unspecified: Secondary | ICD-10-CM | POA: Diagnosis not present

## 2021-06-14 DIAGNOSIS — K219 Gastro-esophageal reflux disease without esophagitis: Secondary | ICD-10-CM | POA: Diagnosis not present

## 2021-06-15 DIAGNOSIS — M810 Age-related osteoporosis without current pathological fracture: Secondary | ICD-10-CM | POA: Diagnosis not present

## 2021-06-15 DIAGNOSIS — F419 Anxiety disorder, unspecified: Secondary | ICD-10-CM | POA: Diagnosis not present

## 2021-06-15 DIAGNOSIS — J441 Chronic obstructive pulmonary disease with (acute) exacerbation: Secondary | ICD-10-CM | POA: Diagnosis not present

## 2021-06-15 DIAGNOSIS — K219 Gastro-esophageal reflux disease without esophagitis: Secondary | ICD-10-CM | POA: Diagnosis not present

## 2021-06-15 DIAGNOSIS — G894 Chronic pain syndrome: Secondary | ICD-10-CM | POA: Diagnosis not present

## 2021-06-15 DIAGNOSIS — F32A Depression, unspecified: Secondary | ICD-10-CM | POA: Diagnosis not present

## 2021-06-17 DIAGNOSIS — G894 Chronic pain syndrome: Secondary | ICD-10-CM | POA: Diagnosis not present

## 2021-06-17 DIAGNOSIS — M810 Age-related osteoporosis without current pathological fracture: Secondary | ICD-10-CM | POA: Diagnosis not present

## 2021-06-17 DIAGNOSIS — F32A Depression, unspecified: Secondary | ICD-10-CM | POA: Diagnosis not present

## 2021-06-17 DIAGNOSIS — F419 Anxiety disorder, unspecified: Secondary | ICD-10-CM | POA: Diagnosis not present

## 2021-06-17 DIAGNOSIS — J441 Chronic obstructive pulmonary disease with (acute) exacerbation: Secondary | ICD-10-CM | POA: Diagnosis not present

## 2021-06-17 DIAGNOSIS — K219 Gastro-esophageal reflux disease without esophagitis: Secondary | ICD-10-CM | POA: Diagnosis not present

## 2021-06-18 DIAGNOSIS — F419 Anxiety disorder, unspecified: Secondary | ICD-10-CM | POA: Diagnosis not present

## 2021-06-18 DIAGNOSIS — F32A Depression, unspecified: Secondary | ICD-10-CM | POA: Diagnosis not present

## 2021-06-18 DIAGNOSIS — G894 Chronic pain syndrome: Secondary | ICD-10-CM | POA: Diagnosis not present

## 2021-06-18 DIAGNOSIS — Z20822 Contact with and (suspected) exposure to covid-19: Secondary | ICD-10-CM | POA: Diagnosis not present

## 2021-06-18 DIAGNOSIS — K219 Gastro-esophageal reflux disease without esophagitis: Secondary | ICD-10-CM | POA: Diagnosis not present

## 2021-06-18 DIAGNOSIS — J441 Chronic obstructive pulmonary disease with (acute) exacerbation: Secondary | ICD-10-CM | POA: Diagnosis not present

## 2021-06-18 DIAGNOSIS — M810 Age-related osteoporosis without current pathological fracture: Secondary | ICD-10-CM | POA: Diagnosis not present

## 2021-06-19 DIAGNOSIS — F32A Depression, unspecified: Secondary | ICD-10-CM | POA: Diagnosis not present

## 2021-06-19 DIAGNOSIS — K219 Gastro-esophageal reflux disease without esophagitis: Secondary | ICD-10-CM | POA: Diagnosis not present

## 2021-06-19 DIAGNOSIS — Z20822 Contact with and (suspected) exposure to covid-19: Secondary | ICD-10-CM | POA: Diagnosis not present

## 2021-06-19 DIAGNOSIS — J441 Chronic obstructive pulmonary disease with (acute) exacerbation: Secondary | ICD-10-CM | POA: Diagnosis not present

## 2021-06-19 DIAGNOSIS — G894 Chronic pain syndrome: Secondary | ICD-10-CM | POA: Diagnosis not present

## 2021-06-19 DIAGNOSIS — M810 Age-related osteoporosis without current pathological fracture: Secondary | ICD-10-CM | POA: Diagnosis not present

## 2021-06-19 DIAGNOSIS — F419 Anxiety disorder, unspecified: Secondary | ICD-10-CM | POA: Diagnosis not present

## 2021-06-20 DIAGNOSIS — M810 Age-related osteoporosis without current pathological fracture: Secondary | ICD-10-CM | POA: Diagnosis not present

## 2021-06-20 DIAGNOSIS — F419 Anxiety disorder, unspecified: Secondary | ICD-10-CM | POA: Diagnosis not present

## 2021-06-20 DIAGNOSIS — K219 Gastro-esophageal reflux disease without esophagitis: Secondary | ICD-10-CM | POA: Diagnosis not present

## 2021-06-20 DIAGNOSIS — J441 Chronic obstructive pulmonary disease with (acute) exacerbation: Secondary | ICD-10-CM | POA: Diagnosis not present

## 2021-06-20 DIAGNOSIS — G894 Chronic pain syndrome: Secondary | ICD-10-CM | POA: Diagnosis not present

## 2021-06-20 DIAGNOSIS — F32A Depression, unspecified: Secondary | ICD-10-CM | POA: Diagnosis not present

## 2021-06-21 DIAGNOSIS — K219 Gastro-esophageal reflux disease without esophagitis: Secondary | ICD-10-CM | POA: Diagnosis not present

## 2021-06-21 DIAGNOSIS — F32A Depression, unspecified: Secondary | ICD-10-CM | POA: Diagnosis not present

## 2021-06-21 DIAGNOSIS — J441 Chronic obstructive pulmonary disease with (acute) exacerbation: Secondary | ICD-10-CM | POA: Diagnosis not present

## 2021-06-21 DIAGNOSIS — M810 Age-related osteoporosis without current pathological fracture: Secondary | ICD-10-CM | POA: Diagnosis not present

## 2021-06-21 DIAGNOSIS — G894 Chronic pain syndrome: Secondary | ICD-10-CM | POA: Diagnosis not present

## 2021-06-21 DIAGNOSIS — F419 Anxiety disorder, unspecified: Secondary | ICD-10-CM | POA: Diagnosis not present

## 2021-06-22 DIAGNOSIS — G894 Chronic pain syndrome: Secondary | ICD-10-CM | POA: Diagnosis not present

## 2021-06-22 DIAGNOSIS — F419 Anxiety disorder, unspecified: Secondary | ICD-10-CM | POA: Diagnosis not present

## 2021-06-22 DIAGNOSIS — K219 Gastro-esophageal reflux disease without esophagitis: Secondary | ICD-10-CM | POA: Diagnosis not present

## 2021-06-22 DIAGNOSIS — M810 Age-related osteoporosis without current pathological fracture: Secondary | ICD-10-CM | POA: Diagnosis not present

## 2021-06-22 DIAGNOSIS — F32A Depression, unspecified: Secondary | ICD-10-CM | POA: Diagnosis not present

## 2021-06-22 DIAGNOSIS — J441 Chronic obstructive pulmonary disease with (acute) exacerbation: Secondary | ICD-10-CM | POA: Diagnosis not present

## 2021-06-23 DIAGNOSIS — F419 Anxiety disorder, unspecified: Secondary | ICD-10-CM | POA: Diagnosis not present

## 2021-06-23 DIAGNOSIS — J441 Chronic obstructive pulmonary disease with (acute) exacerbation: Secondary | ICD-10-CM | POA: Diagnosis not present

## 2021-06-23 DIAGNOSIS — G894 Chronic pain syndrome: Secondary | ICD-10-CM | POA: Diagnosis not present

## 2021-06-23 DIAGNOSIS — M810 Age-related osteoporosis without current pathological fracture: Secondary | ICD-10-CM | POA: Diagnosis not present

## 2021-06-23 DIAGNOSIS — K219 Gastro-esophageal reflux disease without esophagitis: Secondary | ICD-10-CM | POA: Diagnosis not present

## 2021-06-23 DIAGNOSIS — F32A Depression, unspecified: Secondary | ICD-10-CM | POA: Diagnosis not present

## 2021-06-24 DIAGNOSIS — J441 Chronic obstructive pulmonary disease with (acute) exacerbation: Secondary | ICD-10-CM | POA: Diagnosis not present

## 2021-06-24 DIAGNOSIS — F419 Anxiety disorder, unspecified: Secondary | ICD-10-CM | POA: Diagnosis not present

## 2021-06-24 DIAGNOSIS — M810 Age-related osteoporosis without current pathological fracture: Secondary | ICD-10-CM | POA: Diagnosis not present

## 2021-06-24 DIAGNOSIS — F32A Depression, unspecified: Secondary | ICD-10-CM | POA: Diagnosis not present

## 2021-06-24 DIAGNOSIS — K219 Gastro-esophageal reflux disease without esophagitis: Secondary | ICD-10-CM | POA: Diagnosis not present

## 2021-06-24 DIAGNOSIS — G894 Chronic pain syndrome: Secondary | ICD-10-CM | POA: Diagnosis not present

## 2021-06-25 DIAGNOSIS — F32A Depression, unspecified: Secondary | ICD-10-CM | POA: Diagnosis not present

## 2021-06-25 DIAGNOSIS — G894 Chronic pain syndrome: Secondary | ICD-10-CM | POA: Diagnosis not present

## 2021-06-25 DIAGNOSIS — M810 Age-related osteoporosis without current pathological fracture: Secondary | ICD-10-CM | POA: Diagnosis not present

## 2021-06-25 DIAGNOSIS — J441 Chronic obstructive pulmonary disease with (acute) exacerbation: Secondary | ICD-10-CM | POA: Diagnosis not present

## 2021-06-25 DIAGNOSIS — F419 Anxiety disorder, unspecified: Secondary | ICD-10-CM | POA: Diagnosis not present

## 2021-06-25 DIAGNOSIS — K219 Gastro-esophageal reflux disease without esophagitis: Secondary | ICD-10-CM | POA: Diagnosis not present

## 2021-06-26 DIAGNOSIS — J441 Chronic obstructive pulmonary disease with (acute) exacerbation: Secondary | ICD-10-CM | POA: Diagnosis not present

## 2021-06-26 DIAGNOSIS — G894 Chronic pain syndrome: Secondary | ICD-10-CM | POA: Diagnosis not present

## 2021-06-26 DIAGNOSIS — K219 Gastro-esophageal reflux disease without esophagitis: Secondary | ICD-10-CM | POA: Diagnosis not present

## 2021-06-26 DIAGNOSIS — F32A Depression, unspecified: Secondary | ICD-10-CM | POA: Diagnosis not present

## 2021-06-26 DIAGNOSIS — F419 Anxiety disorder, unspecified: Secondary | ICD-10-CM | POA: Diagnosis not present

## 2021-06-26 DIAGNOSIS — M810 Age-related osteoporosis without current pathological fracture: Secondary | ICD-10-CM | POA: Diagnosis not present

## 2021-06-27 DIAGNOSIS — M810 Age-related osteoporosis without current pathological fracture: Secondary | ICD-10-CM | POA: Diagnosis not present

## 2021-06-27 DIAGNOSIS — F32A Depression, unspecified: Secondary | ICD-10-CM | POA: Diagnosis not present

## 2021-06-27 DIAGNOSIS — F419 Anxiety disorder, unspecified: Secondary | ICD-10-CM | POA: Diagnosis not present

## 2021-06-27 DIAGNOSIS — J441 Chronic obstructive pulmonary disease with (acute) exacerbation: Secondary | ICD-10-CM | POA: Diagnosis not present

## 2021-06-27 DIAGNOSIS — K219 Gastro-esophageal reflux disease without esophagitis: Secondary | ICD-10-CM | POA: Diagnosis not present

## 2021-06-27 DIAGNOSIS — G894 Chronic pain syndrome: Secondary | ICD-10-CM | POA: Diagnosis not present

## 2021-06-29 DIAGNOSIS — F419 Anxiety disorder, unspecified: Secondary | ICD-10-CM | POA: Diagnosis not present

## 2021-06-29 DIAGNOSIS — G894 Chronic pain syndrome: Secondary | ICD-10-CM | POA: Diagnosis not present

## 2021-06-29 DIAGNOSIS — J441 Chronic obstructive pulmonary disease with (acute) exacerbation: Secondary | ICD-10-CM | POA: Diagnosis not present

## 2021-06-29 DIAGNOSIS — K219 Gastro-esophageal reflux disease without esophagitis: Secondary | ICD-10-CM | POA: Diagnosis not present

## 2021-06-29 DIAGNOSIS — M810 Age-related osteoporosis without current pathological fracture: Secondary | ICD-10-CM | POA: Diagnosis not present

## 2021-06-29 DIAGNOSIS — F32A Depression, unspecified: Secondary | ICD-10-CM | POA: Diagnosis not present

## 2021-06-30 DIAGNOSIS — M810 Age-related osteoporosis without current pathological fracture: Secondary | ICD-10-CM | POA: Diagnosis not present

## 2021-06-30 DIAGNOSIS — G894 Chronic pain syndrome: Secondary | ICD-10-CM | POA: Diagnosis not present

## 2021-06-30 DIAGNOSIS — F419 Anxiety disorder, unspecified: Secondary | ICD-10-CM | POA: Diagnosis not present

## 2021-06-30 DIAGNOSIS — J441 Chronic obstructive pulmonary disease with (acute) exacerbation: Secondary | ICD-10-CM | POA: Diagnosis not present

## 2021-06-30 DIAGNOSIS — K219 Gastro-esophageal reflux disease without esophagitis: Secondary | ICD-10-CM | POA: Diagnosis not present

## 2021-06-30 DIAGNOSIS — F32A Depression, unspecified: Secondary | ICD-10-CM | POA: Diagnosis not present

## 2021-07-01 DIAGNOSIS — J441 Chronic obstructive pulmonary disease with (acute) exacerbation: Secondary | ICD-10-CM | POA: Diagnosis not present

## 2021-07-01 DIAGNOSIS — M810 Age-related osteoporosis without current pathological fracture: Secondary | ICD-10-CM | POA: Diagnosis not present

## 2021-07-01 DIAGNOSIS — F32A Depression, unspecified: Secondary | ICD-10-CM | POA: Diagnosis not present

## 2021-07-01 DIAGNOSIS — F419 Anxiety disorder, unspecified: Secondary | ICD-10-CM | POA: Diagnosis not present

## 2021-07-01 DIAGNOSIS — K219 Gastro-esophageal reflux disease without esophagitis: Secondary | ICD-10-CM | POA: Diagnosis not present

## 2021-07-01 DIAGNOSIS — G894 Chronic pain syndrome: Secondary | ICD-10-CM | POA: Diagnosis not present

## 2021-07-02 DIAGNOSIS — M810 Age-related osteoporosis without current pathological fracture: Secondary | ICD-10-CM | POA: Diagnosis not present

## 2021-07-02 DIAGNOSIS — F419 Anxiety disorder, unspecified: Secondary | ICD-10-CM | POA: Diagnosis not present

## 2021-07-02 DIAGNOSIS — F32A Depression, unspecified: Secondary | ICD-10-CM | POA: Diagnosis not present

## 2021-07-02 DIAGNOSIS — K219 Gastro-esophageal reflux disease without esophagitis: Secondary | ICD-10-CM | POA: Diagnosis not present

## 2021-07-02 DIAGNOSIS — G894 Chronic pain syndrome: Secondary | ICD-10-CM | POA: Diagnosis not present

## 2021-07-02 DIAGNOSIS — J441 Chronic obstructive pulmonary disease with (acute) exacerbation: Secondary | ICD-10-CM | POA: Diagnosis not present

## 2021-07-03 DIAGNOSIS — K219 Gastro-esophageal reflux disease without esophagitis: Secondary | ICD-10-CM | POA: Diagnosis not present

## 2021-07-03 DIAGNOSIS — G894 Chronic pain syndrome: Secondary | ICD-10-CM | POA: Diagnosis not present

## 2021-07-03 DIAGNOSIS — F419 Anxiety disorder, unspecified: Secondary | ICD-10-CM | POA: Diagnosis not present

## 2021-07-03 DIAGNOSIS — F32A Depression, unspecified: Secondary | ICD-10-CM | POA: Diagnosis not present

## 2021-07-03 DIAGNOSIS — M810 Age-related osteoporosis without current pathological fracture: Secondary | ICD-10-CM | POA: Diagnosis not present

## 2021-07-03 DIAGNOSIS — J441 Chronic obstructive pulmonary disease with (acute) exacerbation: Secondary | ICD-10-CM | POA: Diagnosis not present

## 2021-07-04 DIAGNOSIS — G894 Chronic pain syndrome: Secondary | ICD-10-CM | POA: Diagnosis not present

## 2021-07-04 DIAGNOSIS — F32A Depression, unspecified: Secondary | ICD-10-CM | POA: Diagnosis not present

## 2021-07-04 DIAGNOSIS — M810 Age-related osteoporosis without current pathological fracture: Secondary | ICD-10-CM | POA: Diagnosis not present

## 2021-07-04 DIAGNOSIS — J441 Chronic obstructive pulmonary disease with (acute) exacerbation: Secondary | ICD-10-CM | POA: Diagnosis not present

## 2021-07-04 DIAGNOSIS — F419 Anxiety disorder, unspecified: Secondary | ICD-10-CM | POA: Diagnosis not present

## 2021-07-04 DIAGNOSIS — K219 Gastro-esophageal reflux disease without esophagitis: Secondary | ICD-10-CM | POA: Diagnosis not present

## 2021-07-05 DIAGNOSIS — K219 Gastro-esophageal reflux disease without esophagitis: Secondary | ICD-10-CM | POA: Diagnosis not present

## 2021-07-05 DIAGNOSIS — M810 Age-related osteoporosis without current pathological fracture: Secondary | ICD-10-CM | POA: Diagnosis not present

## 2021-07-05 DIAGNOSIS — G894 Chronic pain syndrome: Secondary | ICD-10-CM | POA: Diagnosis not present

## 2021-07-05 DIAGNOSIS — F32A Depression, unspecified: Secondary | ICD-10-CM | POA: Diagnosis not present

## 2021-07-05 DIAGNOSIS — F419 Anxiety disorder, unspecified: Secondary | ICD-10-CM | POA: Diagnosis not present

## 2021-07-05 DIAGNOSIS — J441 Chronic obstructive pulmonary disease with (acute) exacerbation: Secondary | ICD-10-CM | POA: Diagnosis not present

## 2021-07-06 DIAGNOSIS — G894 Chronic pain syndrome: Secondary | ICD-10-CM | POA: Diagnosis not present

## 2021-07-06 DIAGNOSIS — K219 Gastro-esophageal reflux disease without esophagitis: Secondary | ICD-10-CM | POA: Diagnosis not present

## 2021-07-06 DIAGNOSIS — J441 Chronic obstructive pulmonary disease with (acute) exacerbation: Secondary | ICD-10-CM | POA: Diagnosis not present

## 2021-07-06 DIAGNOSIS — F419 Anxiety disorder, unspecified: Secondary | ICD-10-CM | POA: Diagnosis not present

## 2021-07-06 DIAGNOSIS — F32A Depression, unspecified: Secondary | ICD-10-CM | POA: Diagnosis not present

## 2021-07-06 DIAGNOSIS — M810 Age-related osteoporosis without current pathological fracture: Secondary | ICD-10-CM | POA: Diagnosis not present

## 2021-07-07 DIAGNOSIS — F419 Anxiety disorder, unspecified: Secondary | ICD-10-CM | POA: Diagnosis not present

## 2021-07-07 DIAGNOSIS — G894 Chronic pain syndrome: Secondary | ICD-10-CM | POA: Diagnosis not present

## 2021-07-07 DIAGNOSIS — M810 Age-related osteoporosis without current pathological fracture: Secondary | ICD-10-CM | POA: Diagnosis not present

## 2021-07-07 DIAGNOSIS — F32A Depression, unspecified: Secondary | ICD-10-CM | POA: Diagnosis not present

## 2021-07-07 DIAGNOSIS — K219 Gastro-esophageal reflux disease without esophagitis: Secondary | ICD-10-CM | POA: Diagnosis not present

## 2021-07-07 DIAGNOSIS — J441 Chronic obstructive pulmonary disease with (acute) exacerbation: Secondary | ICD-10-CM | POA: Diagnosis not present

## 2021-07-08 DIAGNOSIS — J441 Chronic obstructive pulmonary disease with (acute) exacerbation: Secondary | ICD-10-CM | POA: Diagnosis not present

## 2021-07-08 DIAGNOSIS — F32A Depression, unspecified: Secondary | ICD-10-CM | POA: Diagnosis not present

## 2021-07-08 DIAGNOSIS — G894 Chronic pain syndrome: Secondary | ICD-10-CM | POA: Diagnosis not present

## 2021-07-08 DIAGNOSIS — Z8701 Personal history of pneumonia (recurrent): Secondary | ICD-10-CM | POA: Diagnosis not present

## 2021-07-08 DIAGNOSIS — Z515 Encounter for palliative care: Secondary | ICD-10-CM | POA: Diagnosis not present

## 2021-07-08 DIAGNOSIS — F419 Anxiety disorder, unspecified: Secondary | ICD-10-CM | POA: Diagnosis not present

## 2021-07-08 DIAGNOSIS — M810 Age-related osteoporosis without current pathological fracture: Secondary | ICD-10-CM | POA: Diagnosis not present

## 2021-07-08 DIAGNOSIS — K219 Gastro-esophageal reflux disease without esophagitis: Secondary | ICD-10-CM | POA: Diagnosis not present

## 2021-07-09 DIAGNOSIS — F32A Depression, unspecified: Secondary | ICD-10-CM | POA: Diagnosis not present

## 2021-07-09 DIAGNOSIS — J441 Chronic obstructive pulmonary disease with (acute) exacerbation: Secondary | ICD-10-CM | POA: Diagnosis not present

## 2021-07-09 DIAGNOSIS — M810 Age-related osteoporosis without current pathological fracture: Secondary | ICD-10-CM | POA: Diagnosis not present

## 2021-07-09 DIAGNOSIS — K219 Gastro-esophageal reflux disease without esophagitis: Secondary | ICD-10-CM | POA: Diagnosis not present

## 2021-07-09 DIAGNOSIS — G894 Chronic pain syndrome: Secondary | ICD-10-CM | POA: Diagnosis not present

## 2021-07-09 DIAGNOSIS — F419 Anxiety disorder, unspecified: Secondary | ICD-10-CM | POA: Diagnosis not present

## 2021-07-10 DIAGNOSIS — K219 Gastro-esophageal reflux disease without esophagitis: Secondary | ICD-10-CM | POA: Diagnosis not present

## 2021-07-10 DIAGNOSIS — F419 Anxiety disorder, unspecified: Secondary | ICD-10-CM | POA: Diagnosis not present

## 2021-07-10 DIAGNOSIS — J441 Chronic obstructive pulmonary disease with (acute) exacerbation: Secondary | ICD-10-CM | POA: Diagnosis not present

## 2021-07-10 DIAGNOSIS — G894 Chronic pain syndrome: Secondary | ICD-10-CM | POA: Diagnosis not present

## 2021-07-10 DIAGNOSIS — M810 Age-related osteoporosis without current pathological fracture: Secondary | ICD-10-CM | POA: Diagnosis not present

## 2021-07-10 DIAGNOSIS — F32A Depression, unspecified: Secondary | ICD-10-CM | POA: Diagnosis not present

## 2021-07-11 DIAGNOSIS — M810 Age-related osteoporosis without current pathological fracture: Secondary | ICD-10-CM | POA: Diagnosis not present

## 2021-07-11 DIAGNOSIS — J441 Chronic obstructive pulmonary disease with (acute) exacerbation: Secondary | ICD-10-CM | POA: Diagnosis not present

## 2021-07-11 DIAGNOSIS — K219 Gastro-esophageal reflux disease without esophagitis: Secondary | ICD-10-CM | POA: Diagnosis not present

## 2021-07-11 DIAGNOSIS — G894 Chronic pain syndrome: Secondary | ICD-10-CM | POA: Diagnosis not present

## 2021-07-11 DIAGNOSIS — F32A Depression, unspecified: Secondary | ICD-10-CM | POA: Diagnosis not present

## 2021-07-11 DIAGNOSIS — F419 Anxiety disorder, unspecified: Secondary | ICD-10-CM | POA: Diagnosis not present

## 2021-07-12 DIAGNOSIS — F32A Depression, unspecified: Secondary | ICD-10-CM | POA: Diagnosis not present

## 2021-07-12 DIAGNOSIS — J441 Chronic obstructive pulmonary disease with (acute) exacerbation: Secondary | ICD-10-CM | POA: Diagnosis not present

## 2021-07-12 DIAGNOSIS — G894 Chronic pain syndrome: Secondary | ICD-10-CM | POA: Diagnosis not present

## 2021-07-12 DIAGNOSIS — F419 Anxiety disorder, unspecified: Secondary | ICD-10-CM | POA: Diagnosis not present

## 2021-07-12 DIAGNOSIS — K219 Gastro-esophageal reflux disease without esophagitis: Secondary | ICD-10-CM | POA: Diagnosis not present

## 2021-07-12 DIAGNOSIS — M810 Age-related osteoporosis without current pathological fracture: Secondary | ICD-10-CM | POA: Diagnosis not present

## 2021-07-13 DIAGNOSIS — M810 Age-related osteoporosis without current pathological fracture: Secondary | ICD-10-CM | POA: Diagnosis not present

## 2021-07-13 DIAGNOSIS — J441 Chronic obstructive pulmonary disease with (acute) exacerbation: Secondary | ICD-10-CM | POA: Diagnosis not present

## 2021-07-13 DIAGNOSIS — F32A Depression, unspecified: Secondary | ICD-10-CM | POA: Diagnosis not present

## 2021-07-13 DIAGNOSIS — G894 Chronic pain syndrome: Secondary | ICD-10-CM | POA: Diagnosis not present

## 2021-07-13 DIAGNOSIS — K219 Gastro-esophageal reflux disease without esophagitis: Secondary | ICD-10-CM | POA: Diagnosis not present

## 2021-07-13 DIAGNOSIS — F419 Anxiety disorder, unspecified: Secondary | ICD-10-CM | POA: Diagnosis not present

## 2021-07-14 DIAGNOSIS — G894 Chronic pain syndrome: Secondary | ICD-10-CM | POA: Diagnosis not present

## 2021-07-14 DIAGNOSIS — M810 Age-related osteoporosis without current pathological fracture: Secondary | ICD-10-CM | POA: Diagnosis not present

## 2021-07-14 DIAGNOSIS — F32A Depression, unspecified: Secondary | ICD-10-CM | POA: Diagnosis not present

## 2021-07-14 DIAGNOSIS — J441 Chronic obstructive pulmonary disease with (acute) exacerbation: Secondary | ICD-10-CM | POA: Diagnosis not present

## 2021-07-14 DIAGNOSIS — F419 Anxiety disorder, unspecified: Secondary | ICD-10-CM | POA: Diagnosis not present

## 2021-07-14 DIAGNOSIS — K219 Gastro-esophageal reflux disease without esophagitis: Secondary | ICD-10-CM | POA: Diagnosis not present

## 2021-07-15 DIAGNOSIS — J441 Chronic obstructive pulmonary disease with (acute) exacerbation: Secondary | ICD-10-CM | POA: Diagnosis not present

## 2021-07-15 DIAGNOSIS — F32A Depression, unspecified: Secondary | ICD-10-CM | POA: Diagnosis not present

## 2021-07-15 DIAGNOSIS — K219 Gastro-esophageal reflux disease without esophagitis: Secondary | ICD-10-CM | POA: Diagnosis not present

## 2021-07-15 DIAGNOSIS — M810 Age-related osteoporosis without current pathological fracture: Secondary | ICD-10-CM | POA: Diagnosis not present

## 2021-07-15 DIAGNOSIS — F419 Anxiety disorder, unspecified: Secondary | ICD-10-CM | POA: Diagnosis not present

## 2021-07-15 DIAGNOSIS — G894 Chronic pain syndrome: Secondary | ICD-10-CM | POA: Diagnosis not present

## 2021-07-16 DIAGNOSIS — K219 Gastro-esophageal reflux disease without esophagitis: Secondary | ICD-10-CM | POA: Diagnosis not present

## 2021-07-16 DIAGNOSIS — F419 Anxiety disorder, unspecified: Secondary | ICD-10-CM | POA: Diagnosis not present

## 2021-07-16 DIAGNOSIS — G894 Chronic pain syndrome: Secondary | ICD-10-CM | POA: Diagnosis not present

## 2021-07-16 DIAGNOSIS — J441 Chronic obstructive pulmonary disease with (acute) exacerbation: Secondary | ICD-10-CM | POA: Diagnosis not present

## 2021-07-16 DIAGNOSIS — M810 Age-related osteoporosis without current pathological fracture: Secondary | ICD-10-CM | POA: Diagnosis not present

## 2021-07-16 DIAGNOSIS — F32A Depression, unspecified: Secondary | ICD-10-CM | POA: Diagnosis not present

## 2021-07-17 DIAGNOSIS — F419 Anxiety disorder, unspecified: Secondary | ICD-10-CM | POA: Diagnosis not present

## 2021-07-17 DIAGNOSIS — J441 Chronic obstructive pulmonary disease with (acute) exacerbation: Secondary | ICD-10-CM | POA: Diagnosis not present

## 2021-07-17 DIAGNOSIS — G894 Chronic pain syndrome: Secondary | ICD-10-CM | POA: Diagnosis not present

## 2021-07-17 DIAGNOSIS — F32A Depression, unspecified: Secondary | ICD-10-CM | POA: Diagnosis not present

## 2021-07-17 DIAGNOSIS — M810 Age-related osteoporosis without current pathological fracture: Secondary | ICD-10-CM | POA: Diagnosis not present

## 2021-07-17 DIAGNOSIS — K219 Gastro-esophageal reflux disease without esophagitis: Secondary | ICD-10-CM | POA: Diagnosis not present

## 2021-07-18 DIAGNOSIS — F32A Depression, unspecified: Secondary | ICD-10-CM | POA: Diagnosis not present

## 2021-07-18 DIAGNOSIS — G894 Chronic pain syndrome: Secondary | ICD-10-CM | POA: Diagnosis not present

## 2021-07-18 DIAGNOSIS — K219 Gastro-esophageal reflux disease without esophagitis: Secondary | ICD-10-CM | POA: Diagnosis not present

## 2021-07-18 DIAGNOSIS — F419 Anxiety disorder, unspecified: Secondary | ICD-10-CM | POA: Diagnosis not present

## 2021-07-18 DIAGNOSIS — J441 Chronic obstructive pulmonary disease with (acute) exacerbation: Secondary | ICD-10-CM | POA: Diagnosis not present

## 2021-07-18 DIAGNOSIS — M810 Age-related osteoporosis without current pathological fracture: Secondary | ICD-10-CM | POA: Diagnosis not present

## 2021-07-19 DIAGNOSIS — F32A Depression, unspecified: Secondary | ICD-10-CM | POA: Diagnosis not present

## 2021-07-19 DIAGNOSIS — K219 Gastro-esophageal reflux disease without esophagitis: Secondary | ICD-10-CM | POA: Diagnosis not present

## 2021-07-19 DIAGNOSIS — M810 Age-related osteoporosis without current pathological fracture: Secondary | ICD-10-CM | POA: Diagnosis not present

## 2021-07-19 DIAGNOSIS — J441 Chronic obstructive pulmonary disease with (acute) exacerbation: Secondary | ICD-10-CM | POA: Diagnosis not present

## 2021-07-19 DIAGNOSIS — F419 Anxiety disorder, unspecified: Secondary | ICD-10-CM | POA: Diagnosis not present

## 2021-07-19 DIAGNOSIS — G894 Chronic pain syndrome: Secondary | ICD-10-CM | POA: Diagnosis not present

## 2021-07-20 DIAGNOSIS — M810 Age-related osteoporosis without current pathological fracture: Secondary | ICD-10-CM | POA: Diagnosis not present

## 2021-07-20 DIAGNOSIS — K219 Gastro-esophageal reflux disease without esophagitis: Secondary | ICD-10-CM | POA: Diagnosis not present

## 2021-07-20 DIAGNOSIS — G894 Chronic pain syndrome: Secondary | ICD-10-CM | POA: Diagnosis not present

## 2021-07-20 DIAGNOSIS — F419 Anxiety disorder, unspecified: Secondary | ICD-10-CM | POA: Diagnosis not present

## 2021-07-20 DIAGNOSIS — F32A Depression, unspecified: Secondary | ICD-10-CM | POA: Diagnosis not present

## 2021-07-20 DIAGNOSIS — J441 Chronic obstructive pulmonary disease with (acute) exacerbation: Secondary | ICD-10-CM | POA: Diagnosis not present

## 2021-07-22 DIAGNOSIS — F32A Depression, unspecified: Secondary | ICD-10-CM | POA: Diagnosis not present

## 2021-07-22 DIAGNOSIS — K219 Gastro-esophageal reflux disease without esophagitis: Secondary | ICD-10-CM | POA: Diagnosis not present

## 2021-07-22 DIAGNOSIS — J441 Chronic obstructive pulmonary disease with (acute) exacerbation: Secondary | ICD-10-CM | POA: Diagnosis not present

## 2021-07-22 DIAGNOSIS — M810 Age-related osteoporosis without current pathological fracture: Secondary | ICD-10-CM | POA: Diagnosis not present

## 2021-07-22 DIAGNOSIS — F419 Anxiety disorder, unspecified: Secondary | ICD-10-CM | POA: Diagnosis not present

## 2021-07-22 DIAGNOSIS — G894 Chronic pain syndrome: Secondary | ICD-10-CM | POA: Diagnosis not present

## 2021-07-23 DIAGNOSIS — J441 Chronic obstructive pulmonary disease with (acute) exacerbation: Secondary | ICD-10-CM | POA: Diagnosis not present

## 2021-07-23 DIAGNOSIS — K219 Gastro-esophageal reflux disease without esophagitis: Secondary | ICD-10-CM | POA: Diagnosis not present

## 2021-07-23 DIAGNOSIS — F419 Anxiety disorder, unspecified: Secondary | ICD-10-CM | POA: Diagnosis not present

## 2021-07-23 DIAGNOSIS — F32A Depression, unspecified: Secondary | ICD-10-CM | POA: Diagnosis not present

## 2021-07-23 DIAGNOSIS — G894 Chronic pain syndrome: Secondary | ICD-10-CM | POA: Diagnosis not present

## 2021-07-23 DIAGNOSIS — M810 Age-related osteoporosis without current pathological fracture: Secondary | ICD-10-CM | POA: Diagnosis not present

## 2021-07-24 DIAGNOSIS — M810 Age-related osteoporosis without current pathological fracture: Secondary | ICD-10-CM | POA: Diagnosis not present

## 2021-07-24 DIAGNOSIS — G894 Chronic pain syndrome: Secondary | ICD-10-CM | POA: Diagnosis not present

## 2021-07-24 DIAGNOSIS — F419 Anxiety disorder, unspecified: Secondary | ICD-10-CM | POA: Diagnosis not present

## 2021-07-24 DIAGNOSIS — F32A Depression, unspecified: Secondary | ICD-10-CM | POA: Diagnosis not present

## 2021-07-24 DIAGNOSIS — K219 Gastro-esophageal reflux disease without esophagitis: Secondary | ICD-10-CM | POA: Diagnosis not present

## 2021-07-24 DIAGNOSIS — J441 Chronic obstructive pulmonary disease with (acute) exacerbation: Secondary | ICD-10-CM | POA: Diagnosis not present

## 2021-07-25 DIAGNOSIS — F419 Anxiety disorder, unspecified: Secondary | ICD-10-CM | POA: Diagnosis not present

## 2021-07-25 DIAGNOSIS — M810 Age-related osteoporosis without current pathological fracture: Secondary | ICD-10-CM | POA: Diagnosis not present

## 2021-07-25 DIAGNOSIS — F32A Depression, unspecified: Secondary | ICD-10-CM | POA: Diagnosis not present

## 2021-07-25 DIAGNOSIS — G894 Chronic pain syndrome: Secondary | ICD-10-CM | POA: Diagnosis not present

## 2021-07-25 DIAGNOSIS — K219 Gastro-esophageal reflux disease without esophagitis: Secondary | ICD-10-CM | POA: Diagnosis not present

## 2021-07-25 DIAGNOSIS — J441 Chronic obstructive pulmonary disease with (acute) exacerbation: Secondary | ICD-10-CM | POA: Diagnosis not present

## 2021-07-27 DIAGNOSIS — G894 Chronic pain syndrome: Secondary | ICD-10-CM | POA: Diagnosis not present

## 2021-07-27 DIAGNOSIS — K219 Gastro-esophageal reflux disease without esophagitis: Secondary | ICD-10-CM | POA: Diagnosis not present

## 2021-07-27 DIAGNOSIS — F32A Depression, unspecified: Secondary | ICD-10-CM | POA: Diagnosis not present

## 2021-07-27 DIAGNOSIS — M810 Age-related osteoporosis without current pathological fracture: Secondary | ICD-10-CM | POA: Diagnosis not present

## 2021-07-27 DIAGNOSIS — J441 Chronic obstructive pulmonary disease with (acute) exacerbation: Secondary | ICD-10-CM | POA: Diagnosis not present

## 2021-07-27 DIAGNOSIS — F419 Anxiety disorder, unspecified: Secondary | ICD-10-CM | POA: Diagnosis not present

## 2021-07-28 DIAGNOSIS — F32A Depression, unspecified: Secondary | ICD-10-CM | POA: Diagnosis not present

## 2021-07-28 DIAGNOSIS — K219 Gastro-esophageal reflux disease without esophagitis: Secondary | ICD-10-CM | POA: Diagnosis not present

## 2021-07-28 DIAGNOSIS — F419 Anxiety disorder, unspecified: Secondary | ICD-10-CM | POA: Diagnosis not present

## 2021-07-28 DIAGNOSIS — G894 Chronic pain syndrome: Secondary | ICD-10-CM | POA: Diagnosis not present

## 2021-07-28 DIAGNOSIS — J441 Chronic obstructive pulmonary disease with (acute) exacerbation: Secondary | ICD-10-CM | POA: Diagnosis not present

## 2021-07-28 DIAGNOSIS — M810 Age-related osteoporosis without current pathological fracture: Secondary | ICD-10-CM | POA: Diagnosis not present

## 2021-07-30 DIAGNOSIS — J441 Chronic obstructive pulmonary disease with (acute) exacerbation: Secondary | ICD-10-CM | POA: Diagnosis not present

## 2021-07-30 DIAGNOSIS — F419 Anxiety disorder, unspecified: Secondary | ICD-10-CM | POA: Diagnosis not present

## 2021-07-30 DIAGNOSIS — F32A Depression, unspecified: Secondary | ICD-10-CM | POA: Diagnosis not present

## 2021-07-30 DIAGNOSIS — G894 Chronic pain syndrome: Secondary | ICD-10-CM | POA: Diagnosis not present

## 2021-07-30 DIAGNOSIS — K219 Gastro-esophageal reflux disease without esophagitis: Secondary | ICD-10-CM | POA: Diagnosis not present

## 2021-07-30 DIAGNOSIS — M810 Age-related osteoporosis without current pathological fracture: Secondary | ICD-10-CM | POA: Diagnosis not present

## 2021-07-31 DIAGNOSIS — M810 Age-related osteoporosis without current pathological fracture: Secondary | ICD-10-CM | POA: Diagnosis not present

## 2021-07-31 DIAGNOSIS — K219 Gastro-esophageal reflux disease without esophagitis: Secondary | ICD-10-CM | POA: Diagnosis not present

## 2021-07-31 DIAGNOSIS — G894 Chronic pain syndrome: Secondary | ICD-10-CM | POA: Diagnosis not present

## 2021-07-31 DIAGNOSIS — J441 Chronic obstructive pulmonary disease with (acute) exacerbation: Secondary | ICD-10-CM | POA: Diagnosis not present

## 2021-07-31 DIAGNOSIS — F419 Anxiety disorder, unspecified: Secondary | ICD-10-CM | POA: Diagnosis not present

## 2021-07-31 DIAGNOSIS — F32A Depression, unspecified: Secondary | ICD-10-CM | POA: Diagnosis not present

## 2021-08-01 DIAGNOSIS — K219 Gastro-esophageal reflux disease without esophagitis: Secondary | ICD-10-CM | POA: Diagnosis not present

## 2021-08-01 DIAGNOSIS — G894 Chronic pain syndrome: Secondary | ICD-10-CM | POA: Diagnosis not present

## 2021-08-01 DIAGNOSIS — F419 Anxiety disorder, unspecified: Secondary | ICD-10-CM | POA: Diagnosis not present

## 2021-08-01 DIAGNOSIS — J441 Chronic obstructive pulmonary disease with (acute) exacerbation: Secondary | ICD-10-CM | POA: Diagnosis not present

## 2021-08-01 DIAGNOSIS — F32A Depression, unspecified: Secondary | ICD-10-CM | POA: Diagnosis not present

## 2021-08-01 DIAGNOSIS — M810 Age-related osteoporosis without current pathological fracture: Secondary | ICD-10-CM | POA: Diagnosis not present

## 2021-08-02 DIAGNOSIS — J441 Chronic obstructive pulmonary disease with (acute) exacerbation: Secondary | ICD-10-CM | POA: Diagnosis not present

## 2021-08-02 DIAGNOSIS — M810 Age-related osteoporosis without current pathological fracture: Secondary | ICD-10-CM | POA: Diagnosis not present

## 2021-08-02 DIAGNOSIS — G894 Chronic pain syndrome: Secondary | ICD-10-CM | POA: Diagnosis not present

## 2021-08-02 DIAGNOSIS — F32A Depression, unspecified: Secondary | ICD-10-CM | POA: Diagnosis not present

## 2021-08-02 DIAGNOSIS — F419 Anxiety disorder, unspecified: Secondary | ICD-10-CM | POA: Diagnosis not present

## 2021-08-02 DIAGNOSIS — K219 Gastro-esophageal reflux disease without esophagitis: Secondary | ICD-10-CM | POA: Diagnosis not present

## 2021-08-03 DIAGNOSIS — G894 Chronic pain syndrome: Secondary | ICD-10-CM | POA: Diagnosis not present

## 2021-08-03 DIAGNOSIS — M810 Age-related osteoporosis without current pathological fracture: Secondary | ICD-10-CM | POA: Diagnosis not present

## 2021-08-03 DIAGNOSIS — J441 Chronic obstructive pulmonary disease with (acute) exacerbation: Secondary | ICD-10-CM | POA: Diagnosis not present

## 2021-08-03 DIAGNOSIS — F32A Depression, unspecified: Secondary | ICD-10-CM | POA: Diagnosis not present

## 2021-08-03 DIAGNOSIS — K219 Gastro-esophageal reflux disease without esophagitis: Secondary | ICD-10-CM | POA: Diagnosis not present

## 2021-08-03 DIAGNOSIS — F419 Anxiety disorder, unspecified: Secondary | ICD-10-CM | POA: Diagnosis not present

## 2021-08-04 DIAGNOSIS — K219 Gastro-esophageal reflux disease without esophagitis: Secondary | ICD-10-CM | POA: Diagnosis not present

## 2021-08-04 DIAGNOSIS — M810 Age-related osteoporosis without current pathological fracture: Secondary | ICD-10-CM | POA: Diagnosis not present

## 2021-08-04 DIAGNOSIS — J441 Chronic obstructive pulmonary disease with (acute) exacerbation: Secondary | ICD-10-CM | POA: Diagnosis not present

## 2021-08-04 DIAGNOSIS — F419 Anxiety disorder, unspecified: Secondary | ICD-10-CM | POA: Diagnosis not present

## 2021-08-04 DIAGNOSIS — F32A Depression, unspecified: Secondary | ICD-10-CM | POA: Diagnosis not present

## 2021-08-04 DIAGNOSIS — G894 Chronic pain syndrome: Secondary | ICD-10-CM | POA: Diagnosis not present

## 2021-08-05 DIAGNOSIS — F32A Depression, unspecified: Secondary | ICD-10-CM | POA: Diagnosis not present

## 2021-08-05 DIAGNOSIS — G894 Chronic pain syndrome: Secondary | ICD-10-CM | POA: Diagnosis not present

## 2021-08-05 DIAGNOSIS — J441 Chronic obstructive pulmonary disease with (acute) exacerbation: Secondary | ICD-10-CM | POA: Diagnosis not present

## 2021-08-05 DIAGNOSIS — M810 Age-related osteoporosis without current pathological fracture: Secondary | ICD-10-CM | POA: Diagnosis not present

## 2021-08-05 DIAGNOSIS — K219 Gastro-esophageal reflux disease without esophagitis: Secondary | ICD-10-CM | POA: Diagnosis not present

## 2021-08-05 DIAGNOSIS — F419 Anxiety disorder, unspecified: Secondary | ICD-10-CM | POA: Diagnosis not present

## 2021-08-06 DIAGNOSIS — G894 Chronic pain syndrome: Secondary | ICD-10-CM | POA: Diagnosis not present

## 2021-08-06 DIAGNOSIS — M810 Age-related osteoporosis without current pathological fracture: Secondary | ICD-10-CM | POA: Diagnosis not present

## 2021-08-06 DIAGNOSIS — J441 Chronic obstructive pulmonary disease with (acute) exacerbation: Secondary | ICD-10-CM | POA: Diagnosis not present

## 2021-08-06 DIAGNOSIS — K219 Gastro-esophageal reflux disease without esophagitis: Secondary | ICD-10-CM | POA: Diagnosis not present

## 2021-08-06 DIAGNOSIS — F32A Depression, unspecified: Secondary | ICD-10-CM | POA: Diagnosis not present

## 2021-08-06 DIAGNOSIS — F419 Anxiety disorder, unspecified: Secondary | ICD-10-CM | POA: Diagnosis not present

## 2021-08-07 DIAGNOSIS — K219 Gastro-esophageal reflux disease without esophagitis: Secondary | ICD-10-CM | POA: Diagnosis not present

## 2021-08-07 DIAGNOSIS — M810 Age-related osteoporosis without current pathological fracture: Secondary | ICD-10-CM | POA: Diagnosis not present

## 2021-08-07 DIAGNOSIS — F419 Anxiety disorder, unspecified: Secondary | ICD-10-CM | POA: Diagnosis not present

## 2021-08-07 DIAGNOSIS — J441 Chronic obstructive pulmonary disease with (acute) exacerbation: Secondary | ICD-10-CM | POA: Diagnosis not present

## 2021-08-07 DIAGNOSIS — G894 Chronic pain syndrome: Secondary | ICD-10-CM | POA: Diagnosis not present

## 2021-08-07 DIAGNOSIS — F32A Depression, unspecified: Secondary | ICD-10-CM | POA: Diagnosis not present

## 2021-08-08 DIAGNOSIS — Z8701 Personal history of pneumonia (recurrent): Secondary | ICD-10-CM | POA: Diagnosis not present

## 2021-08-08 DIAGNOSIS — F32A Depression, unspecified: Secondary | ICD-10-CM | POA: Diagnosis not present

## 2021-08-08 DIAGNOSIS — F419 Anxiety disorder, unspecified: Secondary | ICD-10-CM | POA: Diagnosis not present

## 2021-08-08 DIAGNOSIS — K219 Gastro-esophageal reflux disease without esophagitis: Secondary | ICD-10-CM | POA: Diagnosis not present

## 2021-08-08 DIAGNOSIS — M810 Age-related osteoporosis without current pathological fracture: Secondary | ICD-10-CM | POA: Diagnosis not present

## 2021-08-08 DIAGNOSIS — G894 Chronic pain syndrome: Secondary | ICD-10-CM | POA: Diagnosis not present

## 2021-08-08 DIAGNOSIS — Z515 Encounter for palliative care: Secondary | ICD-10-CM | POA: Diagnosis not present

## 2021-08-08 DIAGNOSIS — J441 Chronic obstructive pulmonary disease with (acute) exacerbation: Secondary | ICD-10-CM | POA: Diagnosis not present

## 2021-08-09 DIAGNOSIS — M810 Age-related osteoporosis without current pathological fracture: Secondary | ICD-10-CM | POA: Diagnosis not present

## 2021-08-09 DIAGNOSIS — K219 Gastro-esophageal reflux disease without esophagitis: Secondary | ICD-10-CM | POA: Diagnosis not present

## 2021-08-09 DIAGNOSIS — G894 Chronic pain syndrome: Secondary | ICD-10-CM | POA: Diagnosis not present

## 2021-08-09 DIAGNOSIS — F419 Anxiety disorder, unspecified: Secondary | ICD-10-CM | POA: Diagnosis not present

## 2021-08-09 DIAGNOSIS — J441 Chronic obstructive pulmonary disease with (acute) exacerbation: Secondary | ICD-10-CM | POA: Diagnosis not present

## 2021-08-09 DIAGNOSIS — F32A Depression, unspecified: Secondary | ICD-10-CM | POA: Diagnosis not present

## 2021-08-10 DIAGNOSIS — J441 Chronic obstructive pulmonary disease with (acute) exacerbation: Secondary | ICD-10-CM | POA: Diagnosis not present

## 2021-08-10 DIAGNOSIS — F419 Anxiety disorder, unspecified: Secondary | ICD-10-CM | POA: Diagnosis not present

## 2021-08-10 DIAGNOSIS — F32A Depression, unspecified: Secondary | ICD-10-CM | POA: Diagnosis not present

## 2021-08-10 DIAGNOSIS — K219 Gastro-esophageal reflux disease without esophagitis: Secondary | ICD-10-CM | POA: Diagnosis not present

## 2021-08-10 DIAGNOSIS — M810 Age-related osteoporosis without current pathological fracture: Secondary | ICD-10-CM | POA: Diagnosis not present

## 2021-08-10 DIAGNOSIS — G894 Chronic pain syndrome: Secondary | ICD-10-CM | POA: Diagnosis not present

## 2021-08-11 DIAGNOSIS — M810 Age-related osteoporosis without current pathological fracture: Secondary | ICD-10-CM | POA: Diagnosis not present

## 2021-08-11 DIAGNOSIS — J441 Chronic obstructive pulmonary disease with (acute) exacerbation: Secondary | ICD-10-CM | POA: Diagnosis not present

## 2021-08-11 DIAGNOSIS — G894 Chronic pain syndrome: Secondary | ICD-10-CM | POA: Diagnosis not present

## 2021-08-11 DIAGNOSIS — K219 Gastro-esophageal reflux disease without esophagitis: Secondary | ICD-10-CM | POA: Diagnosis not present

## 2021-08-11 DIAGNOSIS — F32A Depression, unspecified: Secondary | ICD-10-CM | POA: Diagnosis not present

## 2021-08-11 DIAGNOSIS — F419 Anxiety disorder, unspecified: Secondary | ICD-10-CM | POA: Diagnosis not present

## 2021-08-12 DIAGNOSIS — K219 Gastro-esophageal reflux disease without esophagitis: Secondary | ICD-10-CM | POA: Diagnosis not present

## 2021-08-12 DIAGNOSIS — F32A Depression, unspecified: Secondary | ICD-10-CM | POA: Diagnosis not present

## 2021-08-12 DIAGNOSIS — F419 Anxiety disorder, unspecified: Secondary | ICD-10-CM | POA: Diagnosis not present

## 2021-08-12 DIAGNOSIS — M810 Age-related osteoporosis without current pathological fracture: Secondary | ICD-10-CM | POA: Diagnosis not present

## 2021-08-12 DIAGNOSIS — G894 Chronic pain syndrome: Secondary | ICD-10-CM | POA: Diagnosis not present

## 2021-08-12 DIAGNOSIS — J441 Chronic obstructive pulmonary disease with (acute) exacerbation: Secondary | ICD-10-CM | POA: Diagnosis not present

## 2021-08-13 DIAGNOSIS — K219 Gastro-esophageal reflux disease without esophagitis: Secondary | ICD-10-CM | POA: Diagnosis not present

## 2021-08-13 DIAGNOSIS — F32A Depression, unspecified: Secondary | ICD-10-CM | POA: Diagnosis not present

## 2021-08-13 DIAGNOSIS — F419 Anxiety disorder, unspecified: Secondary | ICD-10-CM | POA: Diagnosis not present

## 2021-08-13 DIAGNOSIS — J441 Chronic obstructive pulmonary disease with (acute) exacerbation: Secondary | ICD-10-CM | POA: Diagnosis not present

## 2021-08-13 DIAGNOSIS — M810 Age-related osteoporosis without current pathological fracture: Secondary | ICD-10-CM | POA: Diagnosis not present

## 2021-08-13 DIAGNOSIS — G894 Chronic pain syndrome: Secondary | ICD-10-CM | POA: Diagnosis not present

## 2021-08-14 DIAGNOSIS — G894 Chronic pain syndrome: Secondary | ICD-10-CM | POA: Diagnosis not present

## 2021-08-14 DIAGNOSIS — J441 Chronic obstructive pulmonary disease with (acute) exacerbation: Secondary | ICD-10-CM | POA: Diagnosis not present

## 2021-08-14 DIAGNOSIS — M810 Age-related osteoporosis without current pathological fracture: Secondary | ICD-10-CM | POA: Diagnosis not present

## 2021-08-14 DIAGNOSIS — F32A Depression, unspecified: Secondary | ICD-10-CM | POA: Diagnosis not present

## 2021-08-14 DIAGNOSIS — K219 Gastro-esophageal reflux disease without esophagitis: Secondary | ICD-10-CM | POA: Diagnosis not present

## 2021-08-14 DIAGNOSIS — F419 Anxiety disorder, unspecified: Secondary | ICD-10-CM | POA: Diagnosis not present

## 2021-08-15 DIAGNOSIS — G894 Chronic pain syndrome: Secondary | ICD-10-CM | POA: Diagnosis not present

## 2021-08-15 DIAGNOSIS — F32A Depression, unspecified: Secondary | ICD-10-CM | POA: Diagnosis not present

## 2021-08-15 DIAGNOSIS — J441 Chronic obstructive pulmonary disease with (acute) exacerbation: Secondary | ICD-10-CM | POA: Diagnosis not present

## 2021-08-15 DIAGNOSIS — M810 Age-related osteoporosis without current pathological fracture: Secondary | ICD-10-CM | POA: Diagnosis not present

## 2021-08-15 DIAGNOSIS — K219 Gastro-esophageal reflux disease without esophagitis: Secondary | ICD-10-CM | POA: Diagnosis not present

## 2021-08-15 DIAGNOSIS — F419 Anxiety disorder, unspecified: Secondary | ICD-10-CM | POA: Diagnosis not present

## 2021-08-16 DIAGNOSIS — G894 Chronic pain syndrome: Secondary | ICD-10-CM | POA: Diagnosis not present

## 2021-08-16 DIAGNOSIS — J441 Chronic obstructive pulmonary disease with (acute) exacerbation: Secondary | ICD-10-CM | POA: Diagnosis not present

## 2021-08-16 DIAGNOSIS — F32A Depression, unspecified: Secondary | ICD-10-CM | POA: Diagnosis not present

## 2021-08-16 DIAGNOSIS — M810 Age-related osteoporosis without current pathological fracture: Secondary | ICD-10-CM | POA: Diagnosis not present

## 2021-08-16 DIAGNOSIS — K219 Gastro-esophageal reflux disease without esophagitis: Secondary | ICD-10-CM | POA: Diagnosis not present

## 2021-08-16 DIAGNOSIS — F419 Anxiety disorder, unspecified: Secondary | ICD-10-CM | POA: Diagnosis not present

## 2021-08-17 DIAGNOSIS — J441 Chronic obstructive pulmonary disease with (acute) exacerbation: Secondary | ICD-10-CM | POA: Diagnosis not present

## 2021-08-17 DIAGNOSIS — G894 Chronic pain syndrome: Secondary | ICD-10-CM | POA: Diagnosis not present

## 2021-08-17 DIAGNOSIS — K219 Gastro-esophageal reflux disease without esophagitis: Secondary | ICD-10-CM | POA: Diagnosis not present

## 2021-08-17 DIAGNOSIS — F32A Depression, unspecified: Secondary | ICD-10-CM | POA: Diagnosis not present

## 2021-08-17 DIAGNOSIS — F419 Anxiety disorder, unspecified: Secondary | ICD-10-CM | POA: Diagnosis not present

## 2021-08-17 DIAGNOSIS — M810 Age-related osteoporosis without current pathological fracture: Secondary | ICD-10-CM | POA: Diagnosis not present

## 2021-08-18 DIAGNOSIS — F03A3 Unspecified dementia, mild, with mood disturbance: Secondary | ICD-10-CM | POA: Diagnosis not present

## 2021-08-18 DIAGNOSIS — F03A4 Unspecified dementia, mild, with anxiety: Secondary | ICD-10-CM | POA: Diagnosis not present

## 2021-08-18 DIAGNOSIS — I509 Heart failure, unspecified: Secondary | ICD-10-CM | POA: Diagnosis not present

## 2021-08-18 DIAGNOSIS — D649 Anemia, unspecified: Secondary | ICD-10-CM | POA: Diagnosis not present

## 2021-08-18 DIAGNOSIS — J449 Chronic obstructive pulmonary disease, unspecified: Secondary | ICD-10-CM | POA: Diagnosis not present

## 2021-08-18 DIAGNOSIS — I4891 Unspecified atrial fibrillation: Secondary | ICD-10-CM | POA: Diagnosis not present

## 2021-08-18 DIAGNOSIS — J302 Other seasonal allergic rhinitis: Secondary | ICD-10-CM | POA: Diagnosis not present

## 2021-08-18 DIAGNOSIS — K219 Gastro-esophageal reflux disease without esophagitis: Secondary | ICD-10-CM | POA: Diagnosis not present

## 2021-08-18 DIAGNOSIS — E43 Unspecified severe protein-calorie malnutrition: Secondary | ICD-10-CM | POA: Diagnosis not present

## 2021-08-18 DIAGNOSIS — G8929 Other chronic pain: Secondary | ICD-10-CM | POA: Diagnosis not present

## 2021-08-18 DIAGNOSIS — M810 Age-related osteoporosis without current pathological fracture: Secondary | ICD-10-CM | POA: Diagnosis not present

## 2021-08-18 DIAGNOSIS — J9611 Chronic respiratory failure with hypoxia: Secondary | ICD-10-CM | POA: Diagnosis not present

## 2021-08-19 DIAGNOSIS — I4891 Unspecified atrial fibrillation: Secondary | ICD-10-CM | POA: Diagnosis not present

## 2021-08-19 DIAGNOSIS — F03A3 Unspecified dementia, mild, with mood disturbance: Secondary | ICD-10-CM | POA: Diagnosis not present

## 2021-08-19 DIAGNOSIS — J9611 Chronic respiratory failure with hypoxia: Secondary | ICD-10-CM | POA: Diagnosis not present

## 2021-08-19 DIAGNOSIS — D649 Anemia, unspecified: Secondary | ICD-10-CM | POA: Diagnosis not present

## 2021-08-19 DIAGNOSIS — J449 Chronic obstructive pulmonary disease, unspecified: Secondary | ICD-10-CM | POA: Diagnosis not present

## 2021-08-19 DIAGNOSIS — F03A4 Unspecified dementia, mild, with anxiety: Secondary | ICD-10-CM | POA: Diagnosis not present

## 2021-08-20 DIAGNOSIS — J9611 Chronic respiratory failure with hypoxia: Secondary | ICD-10-CM | POA: Diagnosis not present

## 2021-08-20 DIAGNOSIS — F03A4 Unspecified dementia, mild, with anxiety: Secondary | ICD-10-CM | POA: Diagnosis not present

## 2021-08-20 DIAGNOSIS — I4891 Unspecified atrial fibrillation: Secondary | ICD-10-CM | POA: Diagnosis not present

## 2021-08-20 DIAGNOSIS — D649 Anemia, unspecified: Secondary | ICD-10-CM | POA: Diagnosis not present

## 2021-08-20 DIAGNOSIS — J449 Chronic obstructive pulmonary disease, unspecified: Secondary | ICD-10-CM | POA: Diagnosis not present

## 2021-08-20 DIAGNOSIS — F03A3 Unspecified dementia, mild, with mood disturbance: Secondary | ICD-10-CM | POA: Diagnosis not present

## 2021-08-21 DIAGNOSIS — F03A3 Unspecified dementia, mild, with mood disturbance: Secondary | ICD-10-CM | POA: Diagnosis not present

## 2021-08-21 DIAGNOSIS — D649 Anemia, unspecified: Secondary | ICD-10-CM | POA: Diagnosis not present

## 2021-08-21 DIAGNOSIS — F03A4 Unspecified dementia, mild, with anxiety: Secondary | ICD-10-CM | POA: Diagnosis not present

## 2021-08-21 DIAGNOSIS — I4891 Unspecified atrial fibrillation: Secondary | ICD-10-CM | POA: Diagnosis not present

## 2021-08-21 DIAGNOSIS — J9611 Chronic respiratory failure with hypoxia: Secondary | ICD-10-CM | POA: Diagnosis not present

## 2021-08-21 DIAGNOSIS — J449 Chronic obstructive pulmonary disease, unspecified: Secondary | ICD-10-CM | POA: Diagnosis not present

## 2021-08-22 DIAGNOSIS — Z20822 Contact with and (suspected) exposure to covid-19: Secondary | ICD-10-CM | POA: Diagnosis not present

## 2021-08-24 DIAGNOSIS — D649 Anemia, unspecified: Secondary | ICD-10-CM | POA: Diagnosis not present

## 2021-08-24 DIAGNOSIS — F03A3 Unspecified dementia, mild, with mood disturbance: Secondary | ICD-10-CM | POA: Diagnosis not present

## 2021-08-24 DIAGNOSIS — J449 Chronic obstructive pulmonary disease, unspecified: Secondary | ICD-10-CM | POA: Diagnosis not present

## 2021-08-24 DIAGNOSIS — J9611 Chronic respiratory failure with hypoxia: Secondary | ICD-10-CM | POA: Diagnosis not present

## 2021-08-24 DIAGNOSIS — F03A4 Unspecified dementia, mild, with anxiety: Secondary | ICD-10-CM | POA: Diagnosis not present

## 2021-08-24 DIAGNOSIS — I4891 Unspecified atrial fibrillation: Secondary | ICD-10-CM | POA: Diagnosis not present

## 2021-08-26 DIAGNOSIS — D649 Anemia, unspecified: Secondary | ICD-10-CM | POA: Diagnosis not present

## 2021-08-26 DIAGNOSIS — J449 Chronic obstructive pulmonary disease, unspecified: Secondary | ICD-10-CM | POA: Diagnosis not present

## 2021-08-26 DIAGNOSIS — F03A3 Unspecified dementia, mild, with mood disturbance: Secondary | ICD-10-CM | POA: Diagnosis not present

## 2021-08-26 DIAGNOSIS — I4891 Unspecified atrial fibrillation: Secondary | ICD-10-CM | POA: Diagnosis not present

## 2021-08-26 DIAGNOSIS — J9611 Chronic respiratory failure with hypoxia: Secondary | ICD-10-CM | POA: Diagnosis not present

## 2021-08-26 DIAGNOSIS — F03A4 Unspecified dementia, mild, with anxiety: Secondary | ICD-10-CM | POA: Diagnosis not present

## 2021-08-28 DIAGNOSIS — J9611 Chronic respiratory failure with hypoxia: Secondary | ICD-10-CM | POA: Diagnosis not present

## 2021-08-28 DIAGNOSIS — D649 Anemia, unspecified: Secondary | ICD-10-CM | POA: Diagnosis not present

## 2021-08-28 DIAGNOSIS — J449 Chronic obstructive pulmonary disease, unspecified: Secondary | ICD-10-CM | POA: Diagnosis not present

## 2021-08-28 DIAGNOSIS — I4891 Unspecified atrial fibrillation: Secondary | ICD-10-CM | POA: Diagnosis not present

## 2021-08-28 DIAGNOSIS — F03A4 Unspecified dementia, mild, with anxiety: Secondary | ICD-10-CM | POA: Diagnosis not present

## 2021-08-28 DIAGNOSIS — F03A3 Unspecified dementia, mild, with mood disturbance: Secondary | ICD-10-CM | POA: Diagnosis not present

## 2021-08-31 DIAGNOSIS — J9611 Chronic respiratory failure with hypoxia: Secondary | ICD-10-CM | POA: Diagnosis not present

## 2021-08-31 DIAGNOSIS — I4891 Unspecified atrial fibrillation: Secondary | ICD-10-CM | POA: Diagnosis not present

## 2021-08-31 DIAGNOSIS — J449 Chronic obstructive pulmonary disease, unspecified: Secondary | ICD-10-CM | POA: Diagnosis not present

## 2021-08-31 DIAGNOSIS — F03A4 Unspecified dementia, mild, with anxiety: Secondary | ICD-10-CM | POA: Diagnosis not present

## 2021-08-31 DIAGNOSIS — F03A3 Unspecified dementia, mild, with mood disturbance: Secondary | ICD-10-CM | POA: Diagnosis not present

## 2021-08-31 DIAGNOSIS — D649 Anemia, unspecified: Secondary | ICD-10-CM | POA: Diagnosis not present

## 2021-09-02 DIAGNOSIS — J449 Chronic obstructive pulmonary disease, unspecified: Secondary | ICD-10-CM | POA: Diagnosis not present

## 2021-09-02 DIAGNOSIS — J9611 Chronic respiratory failure with hypoxia: Secondary | ICD-10-CM | POA: Diagnosis not present

## 2021-09-02 DIAGNOSIS — D649 Anemia, unspecified: Secondary | ICD-10-CM | POA: Diagnosis not present

## 2021-09-02 DIAGNOSIS — F03A3 Unspecified dementia, mild, with mood disturbance: Secondary | ICD-10-CM | POA: Diagnosis not present

## 2021-09-02 DIAGNOSIS — I4891 Unspecified atrial fibrillation: Secondary | ICD-10-CM | POA: Diagnosis not present

## 2021-09-02 DIAGNOSIS — F03A4 Unspecified dementia, mild, with anxiety: Secondary | ICD-10-CM | POA: Diagnosis not present

## 2021-09-03 DIAGNOSIS — Z20822 Contact with and (suspected) exposure to covid-19: Secondary | ICD-10-CM | POA: Diagnosis not present

## 2021-09-04 DIAGNOSIS — J449 Chronic obstructive pulmonary disease, unspecified: Secondary | ICD-10-CM | POA: Diagnosis not present

## 2021-09-04 DIAGNOSIS — J9611 Chronic respiratory failure with hypoxia: Secondary | ICD-10-CM | POA: Diagnosis not present

## 2021-09-04 DIAGNOSIS — I4891 Unspecified atrial fibrillation: Secondary | ICD-10-CM | POA: Diagnosis not present

## 2021-09-04 DIAGNOSIS — F03A3 Unspecified dementia, mild, with mood disturbance: Secondary | ICD-10-CM | POA: Diagnosis not present

## 2021-09-04 DIAGNOSIS — D649 Anemia, unspecified: Secondary | ICD-10-CM | POA: Diagnosis not present

## 2021-09-04 DIAGNOSIS — F03A4 Unspecified dementia, mild, with anxiety: Secondary | ICD-10-CM | POA: Diagnosis not present

## 2021-09-07 DIAGNOSIS — G8929 Other chronic pain: Secondary | ICD-10-CM | POA: Diagnosis not present

## 2021-09-07 DIAGNOSIS — I4891 Unspecified atrial fibrillation: Secondary | ICD-10-CM | POA: Diagnosis not present

## 2021-09-07 DIAGNOSIS — F03A3 Unspecified dementia, mild, with mood disturbance: Secondary | ICD-10-CM | POA: Diagnosis not present

## 2021-09-07 DIAGNOSIS — E43 Unspecified severe protein-calorie malnutrition: Secondary | ICD-10-CM | POA: Diagnosis not present

## 2021-09-07 DIAGNOSIS — M810 Age-related osteoporosis without current pathological fracture: Secondary | ICD-10-CM | POA: Diagnosis not present

## 2021-09-07 DIAGNOSIS — D649 Anemia, unspecified: Secondary | ICD-10-CM | POA: Diagnosis not present

## 2021-09-07 DIAGNOSIS — J302 Other seasonal allergic rhinitis: Secondary | ICD-10-CM | POA: Diagnosis not present

## 2021-09-07 DIAGNOSIS — J449 Chronic obstructive pulmonary disease, unspecified: Secondary | ICD-10-CM | POA: Diagnosis not present

## 2021-09-07 DIAGNOSIS — I509 Heart failure, unspecified: Secondary | ICD-10-CM | POA: Diagnosis not present

## 2021-09-07 DIAGNOSIS — K219 Gastro-esophageal reflux disease without esophagitis: Secondary | ICD-10-CM | POA: Diagnosis not present

## 2021-09-07 DIAGNOSIS — J9611 Chronic respiratory failure with hypoxia: Secondary | ICD-10-CM | POA: Diagnosis not present

## 2021-09-07 DIAGNOSIS — F03A4 Unspecified dementia, mild, with anxiety: Secondary | ICD-10-CM | POA: Diagnosis not present

## 2021-09-09 DIAGNOSIS — D649 Anemia, unspecified: Secondary | ICD-10-CM | POA: Diagnosis not present

## 2021-09-09 DIAGNOSIS — J9611 Chronic respiratory failure with hypoxia: Secondary | ICD-10-CM | POA: Diagnosis not present

## 2021-09-09 DIAGNOSIS — I4891 Unspecified atrial fibrillation: Secondary | ICD-10-CM | POA: Diagnosis not present

## 2021-09-09 DIAGNOSIS — J449 Chronic obstructive pulmonary disease, unspecified: Secondary | ICD-10-CM | POA: Diagnosis not present

## 2021-09-09 DIAGNOSIS — F03A4 Unspecified dementia, mild, with anxiety: Secondary | ICD-10-CM | POA: Diagnosis not present

## 2021-09-09 DIAGNOSIS — F03A3 Unspecified dementia, mild, with mood disturbance: Secondary | ICD-10-CM | POA: Diagnosis not present

## 2021-09-10 DIAGNOSIS — D649 Anemia, unspecified: Secondary | ICD-10-CM | POA: Diagnosis not present

## 2021-09-10 DIAGNOSIS — I4891 Unspecified atrial fibrillation: Secondary | ICD-10-CM | POA: Diagnosis not present

## 2021-09-10 DIAGNOSIS — F03A3 Unspecified dementia, mild, with mood disturbance: Secondary | ICD-10-CM | POA: Diagnosis not present

## 2021-09-10 DIAGNOSIS — J449 Chronic obstructive pulmonary disease, unspecified: Secondary | ICD-10-CM | POA: Diagnosis not present

## 2021-09-10 DIAGNOSIS — J9611 Chronic respiratory failure with hypoxia: Secondary | ICD-10-CM | POA: Diagnosis not present

## 2021-09-10 DIAGNOSIS — F03A4 Unspecified dementia, mild, with anxiety: Secondary | ICD-10-CM | POA: Diagnosis not present

## 2021-09-11 DIAGNOSIS — I4891 Unspecified atrial fibrillation: Secondary | ICD-10-CM | POA: Diagnosis not present

## 2021-09-11 DIAGNOSIS — F03A3 Unspecified dementia, mild, with mood disturbance: Secondary | ICD-10-CM | POA: Diagnosis not present

## 2021-09-11 DIAGNOSIS — F03A4 Unspecified dementia, mild, with anxiety: Secondary | ICD-10-CM | POA: Diagnosis not present

## 2021-09-11 DIAGNOSIS — J449 Chronic obstructive pulmonary disease, unspecified: Secondary | ICD-10-CM | POA: Diagnosis not present

## 2021-09-11 DIAGNOSIS — D649 Anemia, unspecified: Secondary | ICD-10-CM | POA: Diagnosis not present

## 2021-09-11 DIAGNOSIS — J9611 Chronic respiratory failure with hypoxia: Secondary | ICD-10-CM | POA: Diagnosis not present

## 2021-09-12 DIAGNOSIS — J9611 Chronic respiratory failure with hypoxia: Secondary | ICD-10-CM | POA: Diagnosis not present

## 2021-09-12 DIAGNOSIS — F03A3 Unspecified dementia, mild, with mood disturbance: Secondary | ICD-10-CM | POA: Diagnosis not present

## 2021-09-12 DIAGNOSIS — F03A4 Unspecified dementia, mild, with anxiety: Secondary | ICD-10-CM | POA: Diagnosis not present

## 2021-09-12 DIAGNOSIS — I4891 Unspecified atrial fibrillation: Secondary | ICD-10-CM | POA: Diagnosis not present

## 2021-09-12 DIAGNOSIS — D649 Anemia, unspecified: Secondary | ICD-10-CM | POA: Diagnosis not present

## 2021-09-12 DIAGNOSIS — J449 Chronic obstructive pulmonary disease, unspecified: Secondary | ICD-10-CM | POA: Diagnosis not present

## 2021-09-13 DIAGNOSIS — J9611 Chronic respiratory failure with hypoxia: Secondary | ICD-10-CM | POA: Diagnosis not present

## 2021-09-13 DIAGNOSIS — D649 Anemia, unspecified: Secondary | ICD-10-CM | POA: Diagnosis not present

## 2021-09-13 DIAGNOSIS — J449 Chronic obstructive pulmonary disease, unspecified: Secondary | ICD-10-CM | POA: Diagnosis not present

## 2021-09-13 DIAGNOSIS — F03A3 Unspecified dementia, mild, with mood disturbance: Secondary | ICD-10-CM | POA: Diagnosis not present

## 2021-09-13 DIAGNOSIS — I4891 Unspecified atrial fibrillation: Secondary | ICD-10-CM | POA: Diagnosis not present

## 2021-09-13 DIAGNOSIS — F03A4 Unspecified dementia, mild, with anxiety: Secondary | ICD-10-CM | POA: Diagnosis not present

## 2021-09-14 DIAGNOSIS — J449 Chronic obstructive pulmonary disease, unspecified: Secondary | ICD-10-CM | POA: Diagnosis not present

## 2021-09-14 DIAGNOSIS — D649 Anemia, unspecified: Secondary | ICD-10-CM | POA: Diagnosis not present

## 2021-09-14 DIAGNOSIS — J9611 Chronic respiratory failure with hypoxia: Secondary | ICD-10-CM | POA: Diagnosis not present

## 2021-09-14 DIAGNOSIS — F03A3 Unspecified dementia, mild, with mood disturbance: Secondary | ICD-10-CM | POA: Diagnosis not present

## 2021-09-14 DIAGNOSIS — I4891 Unspecified atrial fibrillation: Secondary | ICD-10-CM | POA: Diagnosis not present

## 2021-09-14 DIAGNOSIS — F03A4 Unspecified dementia, mild, with anxiety: Secondary | ICD-10-CM | POA: Diagnosis not present

## 2021-09-16 DIAGNOSIS — F03A3 Unspecified dementia, mild, with mood disturbance: Secondary | ICD-10-CM | POA: Diagnosis not present

## 2021-09-16 DIAGNOSIS — D649 Anemia, unspecified: Secondary | ICD-10-CM | POA: Diagnosis not present

## 2021-09-16 DIAGNOSIS — J449 Chronic obstructive pulmonary disease, unspecified: Secondary | ICD-10-CM | POA: Diagnosis not present

## 2021-09-16 DIAGNOSIS — I4891 Unspecified atrial fibrillation: Secondary | ICD-10-CM | POA: Diagnosis not present

## 2021-09-16 DIAGNOSIS — F03A4 Unspecified dementia, mild, with anxiety: Secondary | ICD-10-CM | POA: Diagnosis not present

## 2021-09-16 DIAGNOSIS — J9611 Chronic respiratory failure with hypoxia: Secondary | ICD-10-CM | POA: Diagnosis not present

## 2021-09-18 DIAGNOSIS — J9611 Chronic respiratory failure with hypoxia: Secondary | ICD-10-CM | POA: Diagnosis not present

## 2021-09-18 DIAGNOSIS — F03A4 Unspecified dementia, mild, with anxiety: Secondary | ICD-10-CM | POA: Diagnosis not present

## 2021-09-18 DIAGNOSIS — J449 Chronic obstructive pulmonary disease, unspecified: Secondary | ICD-10-CM | POA: Diagnosis not present

## 2021-09-18 DIAGNOSIS — F03A3 Unspecified dementia, mild, with mood disturbance: Secondary | ICD-10-CM | POA: Diagnosis not present

## 2021-09-18 DIAGNOSIS — I4891 Unspecified atrial fibrillation: Secondary | ICD-10-CM | POA: Diagnosis not present

## 2021-09-18 DIAGNOSIS — D649 Anemia, unspecified: Secondary | ICD-10-CM | POA: Diagnosis not present

## 2021-09-19 DIAGNOSIS — J449 Chronic obstructive pulmonary disease, unspecified: Secondary | ICD-10-CM | POA: Diagnosis not present

## 2021-09-19 DIAGNOSIS — I4891 Unspecified atrial fibrillation: Secondary | ICD-10-CM | POA: Diagnosis not present

## 2021-09-19 DIAGNOSIS — J9611 Chronic respiratory failure with hypoxia: Secondary | ICD-10-CM | POA: Diagnosis not present

## 2021-09-19 DIAGNOSIS — D649 Anemia, unspecified: Secondary | ICD-10-CM | POA: Diagnosis not present

## 2021-09-19 DIAGNOSIS — F03A4 Unspecified dementia, mild, with anxiety: Secondary | ICD-10-CM | POA: Diagnosis not present

## 2021-09-19 DIAGNOSIS — F03A3 Unspecified dementia, mild, with mood disturbance: Secondary | ICD-10-CM | POA: Diagnosis not present

## 2021-09-20 DIAGNOSIS — D649 Anemia, unspecified: Secondary | ICD-10-CM | POA: Diagnosis not present

## 2021-09-20 DIAGNOSIS — F03A3 Unspecified dementia, mild, with mood disturbance: Secondary | ICD-10-CM | POA: Diagnosis not present

## 2021-09-20 DIAGNOSIS — J449 Chronic obstructive pulmonary disease, unspecified: Secondary | ICD-10-CM | POA: Diagnosis not present

## 2021-09-20 DIAGNOSIS — I4891 Unspecified atrial fibrillation: Secondary | ICD-10-CM | POA: Diagnosis not present

## 2021-09-20 DIAGNOSIS — J9611 Chronic respiratory failure with hypoxia: Secondary | ICD-10-CM | POA: Diagnosis not present

## 2021-09-20 DIAGNOSIS — F03A4 Unspecified dementia, mild, with anxiety: Secondary | ICD-10-CM | POA: Diagnosis not present

## 2021-09-21 DIAGNOSIS — J9611 Chronic respiratory failure with hypoxia: Secondary | ICD-10-CM | POA: Diagnosis not present

## 2021-09-21 DIAGNOSIS — F03A3 Unspecified dementia, mild, with mood disturbance: Secondary | ICD-10-CM | POA: Diagnosis not present

## 2021-09-21 DIAGNOSIS — D649 Anemia, unspecified: Secondary | ICD-10-CM | POA: Diagnosis not present

## 2021-09-21 DIAGNOSIS — I4891 Unspecified atrial fibrillation: Secondary | ICD-10-CM | POA: Diagnosis not present

## 2021-09-21 DIAGNOSIS — F03A4 Unspecified dementia, mild, with anxiety: Secondary | ICD-10-CM | POA: Diagnosis not present

## 2021-09-21 DIAGNOSIS — J449 Chronic obstructive pulmonary disease, unspecified: Secondary | ICD-10-CM | POA: Diagnosis not present

## 2021-09-22 DIAGNOSIS — D649 Anemia, unspecified: Secondary | ICD-10-CM | POA: Diagnosis not present

## 2021-09-22 DIAGNOSIS — F03A4 Unspecified dementia, mild, with anxiety: Secondary | ICD-10-CM | POA: Diagnosis not present

## 2021-09-22 DIAGNOSIS — I4891 Unspecified atrial fibrillation: Secondary | ICD-10-CM | POA: Diagnosis not present

## 2021-09-22 DIAGNOSIS — J449 Chronic obstructive pulmonary disease, unspecified: Secondary | ICD-10-CM | POA: Diagnosis not present

## 2021-09-22 DIAGNOSIS — F03A3 Unspecified dementia, mild, with mood disturbance: Secondary | ICD-10-CM | POA: Diagnosis not present

## 2021-09-22 DIAGNOSIS — J9611 Chronic respiratory failure with hypoxia: Secondary | ICD-10-CM | POA: Diagnosis not present

## 2021-09-23 DIAGNOSIS — J449 Chronic obstructive pulmonary disease, unspecified: Secondary | ICD-10-CM | POA: Diagnosis not present

## 2021-09-23 DIAGNOSIS — J9611 Chronic respiratory failure with hypoxia: Secondary | ICD-10-CM | POA: Diagnosis not present

## 2021-09-23 DIAGNOSIS — F03A3 Unspecified dementia, mild, with mood disturbance: Secondary | ICD-10-CM | POA: Diagnosis not present

## 2021-09-23 DIAGNOSIS — F03A4 Unspecified dementia, mild, with anxiety: Secondary | ICD-10-CM | POA: Diagnosis not present

## 2021-09-23 DIAGNOSIS — I4891 Unspecified atrial fibrillation: Secondary | ICD-10-CM | POA: Diagnosis not present

## 2021-09-23 DIAGNOSIS — D649 Anemia, unspecified: Secondary | ICD-10-CM | POA: Diagnosis not present

## 2021-09-24 DIAGNOSIS — F03A4 Unspecified dementia, mild, with anxiety: Secondary | ICD-10-CM | POA: Diagnosis not present

## 2021-09-24 DIAGNOSIS — J449 Chronic obstructive pulmonary disease, unspecified: Secondary | ICD-10-CM | POA: Diagnosis not present

## 2021-09-24 DIAGNOSIS — D649 Anemia, unspecified: Secondary | ICD-10-CM | POA: Diagnosis not present

## 2021-09-24 DIAGNOSIS — I4891 Unspecified atrial fibrillation: Secondary | ICD-10-CM | POA: Diagnosis not present

## 2021-09-24 DIAGNOSIS — F03A3 Unspecified dementia, mild, with mood disturbance: Secondary | ICD-10-CM | POA: Diagnosis not present

## 2021-09-24 DIAGNOSIS — J9611 Chronic respiratory failure with hypoxia: Secondary | ICD-10-CM | POA: Diagnosis not present

## 2021-10-08 DEATH — deceased
